# Patient Record
Sex: Male | Born: 1940 | Race: White | Hispanic: No | Marital: Married | Smoking: Never smoker
Health system: Southern US, Community
[De-identification: ages and names within clinical notes are randomized; demographics above are authoritative.]

## PROBLEM LIST (undated history)

## (undated) DIAGNOSIS — E785 Hyperlipidemia, unspecified: Secondary | ICD-10-CM

## (undated) DIAGNOSIS — U071 COVID-19: Secondary | ICD-10-CM

## (undated) DIAGNOSIS — J45909 Unspecified asthma, uncomplicated: Secondary | ICD-10-CM

## (undated) DIAGNOSIS — I10 Essential (primary) hypertension: Secondary | ICD-10-CM

## (undated) DIAGNOSIS — I4891 Unspecified atrial fibrillation: Secondary | ICD-10-CM

## (undated) DIAGNOSIS — I739 Peripheral vascular disease, unspecified: Secondary | ICD-10-CM

## (undated) DIAGNOSIS — E119 Type 2 diabetes mellitus without complications: Secondary | ICD-10-CM

## (undated) DIAGNOSIS — E663 Overweight: Secondary | ICD-10-CM

## (undated) DIAGNOSIS — N200 Calculus of kidney: Secondary | ICD-10-CM

## (undated) DIAGNOSIS — I2510 Atherosclerotic heart disease of native coronary artery without angina pectoris: Secondary | ICD-10-CM

## (undated) DIAGNOSIS — I639 Cerebral infarction, unspecified: Secondary | ICD-10-CM

## (undated) HISTORY — DX: COVID-19: U07.1

## (undated) HISTORY — DX: Unspecified asthma, uncomplicated: J45.909

## (undated) HISTORY — PX: HEMORRHOID BANDING: SHX5850

## (undated) HISTORY — DX: Cerebral infarction, unspecified: I63.9

## (undated) HISTORY — DX: Atherosclerotic heart disease of native coronary artery without angina pectoris: I25.10

## (undated) HISTORY — DX: Calculus of kidney: N20.0

## (undated) HISTORY — DX: Hyperlipidemia, unspecified: E78.5

## (undated) HISTORY — DX: Peripheral vascular disease, unspecified: I73.9

## (undated) HISTORY — DX: Overweight: E66.3

## (undated) HISTORY — DX: Essential (primary) hypertension: I10

## (undated) HISTORY — PX: NASAL SINUS SURGERY: SHX719

---

## 1997-08-21 HISTORY — PX: CAROTID ENDARTERECTOMY: SUR193

## 1998-03-21 HISTORY — PX: CORONARY ARTERY BYPASS GRAFT: SHX141

## 1998-04-17 ENCOUNTER — Encounter: Payer: Self-pay | Admitting: Emergency Medicine

## 1998-04-17 ENCOUNTER — Inpatient Hospital Stay (HOSPITAL_COMMUNITY): Admission: EM | Admit: 1998-04-17 | Discharge: 1998-04-27 | Payer: Self-pay | Admitting: Emergency Medicine

## 1998-04-19 ENCOUNTER — Encounter: Payer: Self-pay | Admitting: Cardiothoracic Surgery

## 1998-04-20 ENCOUNTER — Encounter: Payer: Self-pay | Admitting: Cardiothoracic Surgery

## 1998-04-21 ENCOUNTER — Encounter: Payer: Self-pay | Admitting: Cardiothoracic Surgery

## 1998-04-23 ENCOUNTER — Encounter: Payer: Self-pay | Admitting: Cardiothoracic Surgery

## 1998-04-27 ENCOUNTER — Encounter: Payer: Self-pay | Admitting: Cardiothoracic Surgery

## 1998-05-18 ENCOUNTER — Encounter (HOSPITAL_COMMUNITY): Admission: RE | Admit: 1998-05-18 | Discharge: 1998-08-16 | Payer: Self-pay | Admitting: Cardiology

## 1998-06-18 ENCOUNTER — Ambulatory Visit: Admission: RE | Admit: 1998-06-18 | Discharge: 1998-06-18 | Payer: Self-pay | Admitting: Cardiology

## 1998-12-27 ENCOUNTER — Ambulatory Visit (HOSPITAL_COMMUNITY): Admission: RE | Admit: 1998-12-27 | Discharge: 1998-12-27 | Payer: Self-pay | Admitting: Internal Medicine

## 2001-04-28 ENCOUNTER — Inpatient Hospital Stay (HOSPITAL_COMMUNITY): Admission: EM | Admit: 2001-04-28 | Discharge: 2001-04-30 | Payer: Self-pay

## 2001-04-30 ENCOUNTER — Encounter: Payer: Self-pay | Admitting: Cardiovascular Disease

## 2004-08-09 ENCOUNTER — Ambulatory Visit: Payer: Self-pay | Admitting: Internal Medicine

## 2004-09-07 ENCOUNTER — Ambulatory Visit: Payer: Self-pay | Admitting: Internal Medicine

## 2004-09-14 ENCOUNTER — Ambulatory Visit: Payer: Self-pay | Admitting: Internal Medicine

## 2005-02-17 ENCOUNTER — Ambulatory Visit: Payer: Self-pay | Admitting: Internal Medicine

## 2005-05-22 ENCOUNTER — Ambulatory Visit: Payer: Self-pay | Admitting: Internal Medicine

## 2005-05-24 ENCOUNTER — Ambulatory Visit: Payer: Self-pay | Admitting: Cardiology

## 2005-06-02 ENCOUNTER — Ambulatory Visit: Payer: Self-pay | Admitting: Cardiology

## 2005-06-06 ENCOUNTER — Ambulatory Visit: Payer: Self-pay | Admitting: Gastroenterology

## 2005-06-06 ENCOUNTER — Ambulatory Visit: Payer: Self-pay

## 2005-06-12 ENCOUNTER — Ambulatory Visit: Payer: Self-pay | Admitting: Cardiology

## 2005-06-12 ENCOUNTER — Observation Stay (HOSPITAL_COMMUNITY): Admission: EM | Admit: 2005-06-12 | Discharge: 2005-06-13 | Payer: Self-pay | Admitting: Emergency Medicine

## 2005-06-13 ENCOUNTER — Ambulatory Visit: Payer: Self-pay

## 2005-06-13 ENCOUNTER — Ambulatory Visit: Payer: Self-pay | Admitting: Cardiology

## 2005-06-29 ENCOUNTER — Ambulatory Visit: Payer: Self-pay | Admitting: Cardiology

## 2005-10-26 ENCOUNTER — Ambulatory Visit: Payer: Self-pay | Admitting: Cardiology

## 2005-12-20 ENCOUNTER — Encounter: Payer: Self-pay | Admitting: Internal Medicine

## 2005-12-20 ENCOUNTER — Ambulatory Visit: Payer: Self-pay | Admitting: Cardiology

## 2006-01-25 ENCOUNTER — Ambulatory Visit: Payer: Self-pay | Admitting: Internal Medicine

## 2006-03-01 ENCOUNTER — Ambulatory Visit: Payer: Self-pay | Admitting: Cardiology

## 2006-03-15 ENCOUNTER — Ambulatory Visit: Payer: Self-pay | Admitting: Internal Medicine

## 2006-05-07 ENCOUNTER — Ambulatory Visit: Payer: Self-pay | Admitting: Cardiology

## 2006-05-16 ENCOUNTER — Ambulatory Visit: Payer: Self-pay | Admitting: Cardiology

## 2006-06-15 ENCOUNTER — Ambulatory Visit: Payer: Self-pay

## 2006-06-15 ENCOUNTER — Ambulatory Visit: Payer: Self-pay | Admitting: Internal Medicine

## 2006-07-19 ENCOUNTER — Ambulatory Visit: Payer: Self-pay | Admitting: Cardiology

## 2006-07-19 LAB — CONVERTED CEMR LAB
ALT: 40 units/L (ref 0–40)
AST: 40 units/L — ABNORMAL HIGH (ref 0–37)
Albumin: 3.7 g/dL (ref 3.5–5.2)
Alkaline Phosphatase: 76 units/L (ref 39–117)
Bilirubin, Direct: 0.2 mg/dL (ref 0.0–0.3)
Chol/HDL Ratio, serum: 3.4
Cholesterol: 133 mg/dL (ref 0–200)
HDL: 39.4 mg/dL (ref >39.0)
LDL Cholesterol: 75 mg/dL (ref 0–99)
Total Bilirubin: 0.7 mg/dL (ref 0.3–1.2)
Total Protein: 6.7 g/dL (ref 6.0–8.3)
Triglyceride fasting, serum: 94 mg/dL (ref 0–149)
VLDL: 19 mg/dL (ref 0–40)

## 2006-10-10 ENCOUNTER — Ambulatory Visit: Payer: Self-pay | Admitting: Cardiology

## 2006-10-10 LAB — CONVERTED CEMR LAB
ALT: 38 units/L (ref 0–40)
AST: 37 units/L (ref 0–37)
Albumin: 3.5 g/dL (ref 3.5–5.2)
Alkaline Phosphatase: 74 units/L (ref 39–117)
Bilirubin, Direct: 0.2 mg/dL (ref 0.0–0.3)
Cholesterol: 130 mg/dL (ref 0–200)
HDL: 39.5 mg/dL (ref >39.0)
LDL Cholesterol: 72 mg/dL (ref 0–99)
Total Bilirubin: 0.8 mg/dL (ref 0.3–1.2)
Total CHOL/HDL Ratio: 3.3
Total Protein: 6.8 g/dL (ref 6.0–8.3)
Triglycerides: 92 mg/dL (ref 0–149)
VLDL: 18 mg/dL (ref 0–40)

## 2006-11-08 ENCOUNTER — Ambulatory Visit: Payer: Self-pay | Admitting: Internal Medicine

## 2007-05-16 ENCOUNTER — Ambulatory Visit: Payer: Self-pay | Admitting: Cardiology

## 2007-05-16 LAB — CONVERTED CEMR LAB
ALT: 29 units/L (ref 0–53)
AST: 35 units/L (ref 0–37)
Albumin: 3.6 g/dL (ref 3.5–5.2)
Alkaline Phosphatase: 75 units/L (ref 39–117)
Bilirubin, Direct: 0.2 mg/dL (ref 0.0–0.3)
Cholesterol: 119 mg/dL (ref 0–200)
HDL: 38.9 mg/dL — ABNORMAL LOW (ref >39.0)
LDL Cholesterol: 72 mg/dL (ref 0–99)
Total Bilirubin: 1.1 mg/dL (ref 0.3–1.2)
Total CHOL/HDL Ratio: 3.1
Total Protein: 6.7 g/dL (ref 6.0–8.3)
Triglycerides: 43 mg/dL (ref 0–149)
VLDL: 9 mg/dL (ref 0–40)

## 2007-05-21 ENCOUNTER — Ambulatory Visit: Payer: Self-pay | Admitting: Cardiology

## 2007-06-18 ENCOUNTER — Ambulatory Visit: Payer: Self-pay

## 2007-07-10 ENCOUNTER — Ambulatory Visit: Payer: Self-pay | Admitting: Internal Medicine

## 2007-07-10 DIAGNOSIS — J019 Acute sinusitis, unspecified: Secondary | ICD-10-CM | POA: Insufficient documentation

## 2007-08-22 HISTORY — PX: COLONOSCOPY W/ POLYPECTOMY: SHX1380

## 2007-10-28 ENCOUNTER — Ambulatory Visit: Payer: Self-pay | Admitting: Gastroenterology

## 2007-11-11 ENCOUNTER — Ambulatory Visit: Payer: Self-pay | Admitting: Gastroenterology

## 2007-11-11 ENCOUNTER — Encounter: Payer: Self-pay | Admitting: Internal Medicine

## 2007-12-17 ENCOUNTER — Ambulatory Visit: Payer: Self-pay | Admitting: Cardiology

## 2007-12-17 ENCOUNTER — Ambulatory Visit: Payer: Self-pay

## 2007-12-17 LAB — CONVERTED CEMR LAB
ALT: 33 units/L (ref 0–53)
AST: 36 units/L (ref 0–37)
Albumin: 3.7 g/dL (ref 3.5–5.2)
Alkaline Phosphatase: 74 units/L (ref 39–117)
Bilirubin, Direct: 0.1 mg/dL (ref 0.0–0.3)
Cholesterol: 127 mg/dL (ref 0–200)
HDL: 36.2 mg/dL — ABNORMAL LOW (ref >39.0)
LDL Cholesterol: 77 mg/dL (ref 0–99)
Total Bilirubin: 1 mg/dL (ref 0.3–1.2)
Total CHOL/HDL Ratio: 3.5
Total Protein: 7.2 g/dL (ref 6.0–8.3)
Triglycerides: 69 mg/dL (ref 0–149)
VLDL: 14 mg/dL (ref 0–40)

## 2008-05-26 ENCOUNTER — Ambulatory Visit: Payer: Self-pay | Admitting: Cardiology

## 2008-07-20 ENCOUNTER — Ambulatory Visit: Payer: Self-pay | Admitting: Internal Medicine

## 2008-07-20 DIAGNOSIS — Z87442 Personal history of urinary calculi: Secondary | ICD-10-CM | POA: Insufficient documentation

## 2008-07-20 DIAGNOSIS — I2510 Atherosclerotic heart disease of native coronary artery without angina pectoris: Secondary | ICD-10-CM | POA: Insufficient documentation

## 2008-07-20 DIAGNOSIS — R0609 Other forms of dyspnea: Secondary | ICD-10-CM | POA: Insufficient documentation

## 2008-07-20 DIAGNOSIS — I10 Essential (primary) hypertension: Secondary | ICD-10-CM | POA: Insufficient documentation

## 2008-07-20 DIAGNOSIS — R0989 Other specified symptoms and signs involving the circulatory and respiratory systems: Secondary | ICD-10-CM

## 2008-07-20 DIAGNOSIS — J309 Allergic rhinitis, unspecified: Secondary | ICD-10-CM | POA: Insufficient documentation

## 2008-07-20 DIAGNOSIS — E785 Hyperlipidemia, unspecified: Secondary | ICD-10-CM | POA: Insufficient documentation

## 2008-07-20 LAB — CONVERTED CEMR LAB
Bilirubin Urine: NEGATIVE
Blood in Urine, dipstick: NEGATIVE
Glucose, Urine, Semiquant: NEGATIVE
Ketones, urine, test strip: NEGATIVE
Nitrite: NEGATIVE
Specific Gravity, Urine: >=1.03
Urobilinogen, UA: NEGATIVE
WBC Urine, dipstick: NEGATIVE
pH: 5

## 2008-07-21 ENCOUNTER — Encounter (INDEPENDENT_AMBULATORY_CARE_PROVIDER_SITE_OTHER): Payer: Self-pay | Admitting: *Deleted

## 2008-07-21 LAB — CONVERTED CEMR LAB
BUN: 18 mg/dL (ref 6–23)
Basophils Absolute: 0 10*3/uL (ref 0.0–0.1)
Basophils Relative: 0.6 % (ref 0.0–3.0)
CO2: 24 meq/L (ref 19–32)
Calcium: 9.1 mg/dL (ref 8.4–10.5)
Chloride: 110 meq/L (ref 96–112)
Creatinine, Ser: 0.8 mg/dL (ref 0.4–1.5)
Eosinophils Absolute: 0.1 10*3/uL (ref 0.0–0.7)
Eosinophils Relative: 1.2 % (ref 0.0–5.0)
GFR calc Af Amer: 125 mL/min
GFR calc non Af Amer: 103 mL/min
Glucose, Bld: 84 mg/dL (ref 70–99)
HCT: 44.6 % (ref 39.0–52.0)
Hemoglobin: 15.3 g/dL (ref 13.0–17.0)
Lymphocytes Relative: 35.6 % (ref 12.0–46.0)
MCHC: 34.2 g/dL (ref 30.0–36.0)
MCV: 98.5 fL (ref 78.0–100.0)
Monocytes Absolute: 0.4 10*3/uL (ref 0.1–1.0)
Monocytes Relative: 8 % (ref 3.0–12.0)
Neutro Abs: 3 10*3/uL (ref 1.4–7.7)
Neutrophils Relative %: 54.6 % (ref 43.0–77.0)
PSA: 0.24 ng/mL (ref 0.10–4.00)
Platelets: 162 10*3/uL (ref 150–400)
Potassium: 3.8 meq/L (ref 3.5–5.1)
RBC: 4.53 M/uL (ref 4.22–5.81)
RDW: 12.1 % (ref 11.5–14.6)
Sodium: 142 meq/L (ref 135–145)
TSH: 2.38 microintl units/mL (ref 0.35–5.50)
WBC: 5.5 10*3/uL (ref 4.5–10.5)

## 2008-07-22 ENCOUNTER — Ambulatory Visit: Payer: Self-pay | Admitting: Internal Medicine

## 2008-07-23 ENCOUNTER — Encounter (INDEPENDENT_AMBULATORY_CARE_PROVIDER_SITE_OTHER): Payer: Self-pay | Admitting: *Deleted

## 2008-11-17 DIAGNOSIS — E663 Overweight: Secondary | ICD-10-CM | POA: Insufficient documentation

## 2008-12-16 ENCOUNTER — Ambulatory Visit: Payer: Self-pay

## 2008-12-16 ENCOUNTER — Encounter: Payer: Self-pay | Admitting: Cardiology

## 2009-04-08 ENCOUNTER — Encounter (INDEPENDENT_AMBULATORY_CARE_PROVIDER_SITE_OTHER): Payer: Self-pay | Admitting: *Deleted

## 2009-04-16 ENCOUNTER — Telehealth: Payer: Self-pay | Admitting: Cardiology

## 2009-05-27 ENCOUNTER — Ambulatory Visit: Payer: Self-pay | Admitting: Cardiology

## 2009-06-03 ENCOUNTER — Ambulatory Visit: Payer: Self-pay | Admitting: Cardiology

## 2009-06-03 LAB — CONVERTED CEMR LAB
ALT: 34 units/L (ref 0–53)
AST: 33 units/L (ref 0–37)
Albumin: 3.7 g/dL (ref 3.5–5.2)
Alkaline Phosphatase: 74 units/L (ref 39–117)
Bilirubin, Direct: 0.2 mg/dL (ref 0.0–0.3)
Cholesterol: 125 mg/dL (ref 0–200)
HDL: 36.7 mg/dL — ABNORMAL LOW (ref >39.00)
LDL Cholesterol: 71 mg/dL (ref 0–99)
Total Bilirubin: 0.7 mg/dL (ref 0.3–1.2)
Total CHOL/HDL Ratio: 3
Total Protein: 7 g/dL (ref 6.0–8.3)
Triglycerides: 86 mg/dL (ref 0.0–149.0)
VLDL: 17.2 mg/dL (ref 0.0–40.0)

## 2009-07-20 ENCOUNTER — Telehealth: Payer: Self-pay | Admitting: Cardiology

## 2009-09-29 ENCOUNTER — Telehealth: Payer: Self-pay | Admitting: Cardiology

## 2009-10-12 ENCOUNTER — Ambulatory Visit: Payer: Self-pay | Admitting: Internal Medicine

## 2009-10-22 ENCOUNTER — Telehealth: Payer: Self-pay | Admitting: Cardiology

## 2009-11-08 ENCOUNTER — Telehealth: Payer: Self-pay | Admitting: Internal Medicine

## 2009-11-09 ENCOUNTER — Ambulatory Visit: Payer: Self-pay | Admitting: Internal Medicine

## 2009-11-09 DIAGNOSIS — L255 Unspecified contact dermatitis due to plants, except food: Secondary | ICD-10-CM | POA: Insufficient documentation

## 2009-12-20 ENCOUNTER — Encounter: Payer: Self-pay | Admitting: Cardiology

## 2009-12-20 DIAGNOSIS — I779 Disorder of arteries and arterioles, unspecified: Secondary | ICD-10-CM | POA: Insufficient documentation

## 2009-12-20 DIAGNOSIS — I739 Peripheral vascular disease, unspecified: Secondary | ICD-10-CM

## 2009-12-21 ENCOUNTER — Telehealth: Payer: Self-pay | Admitting: Cardiology

## 2009-12-21 ENCOUNTER — Encounter: Payer: Self-pay | Admitting: Cardiology

## 2009-12-21 ENCOUNTER — Ambulatory Visit: Payer: Self-pay

## 2010-03-08 ENCOUNTER — Ambulatory Visit: Payer: Self-pay | Admitting: Internal Medicine

## 2010-03-08 DIAGNOSIS — M940 Chondrocostal junction syndrome [Tietze]: Secondary | ICD-10-CM | POA: Insufficient documentation

## 2010-03-08 DIAGNOSIS — R079 Chest pain, unspecified: Secondary | ICD-10-CM | POA: Insufficient documentation

## 2010-04-20 ENCOUNTER — Ambulatory Visit: Payer: Self-pay | Admitting: Cardiology

## 2010-04-29 ENCOUNTER — Encounter: Payer: Self-pay | Admitting: Cardiology

## 2010-04-29 LAB — CONVERTED CEMR LAB
ALT: 37 units/L (ref 0–53)
AST: 39 units/L — ABNORMAL HIGH (ref 0–37)
Albumin: 3.7 g/dL (ref 3.5–5.2)
Alkaline Phosphatase: 74 units/L (ref 39–117)
Bilirubin, Direct: 0.2 mg/dL (ref 0.0–0.3)
Cholesterol: 141 mg/dL (ref 0–200)
HDL: 38.3 mg/dL — ABNORMAL LOW (ref >39.00)
LDL Cholesterol: 84 mg/dL (ref 0–99)
Total Bilirubin: 0.9 mg/dL (ref 0.3–1.2)
Total CHOL/HDL Ratio: 4
Total Protein: 6.7 g/dL (ref 6.0–8.3)
Triglycerides: 92 mg/dL (ref 0.0–149.0)
VLDL: 18.4 mg/dL (ref 0.0–40.0)

## 2010-05-04 ENCOUNTER — Encounter: Payer: Self-pay | Admitting: Cardiology

## 2010-05-05 ENCOUNTER — Ambulatory Visit: Payer: Self-pay | Admitting: Internal Medicine

## 2010-05-05 ENCOUNTER — Ambulatory Visit: Payer: Self-pay | Admitting: Cardiology

## 2010-05-12 ENCOUNTER — Ambulatory Visit: Payer: Self-pay | Admitting: Internal Medicine

## 2010-06-07 ENCOUNTER — Ambulatory Visit: Payer: Self-pay

## 2010-06-07 ENCOUNTER — Ambulatory Visit: Payer: Self-pay | Admitting: Cardiology

## 2010-06-22 ENCOUNTER — Telehealth (INDEPENDENT_AMBULATORY_CARE_PROVIDER_SITE_OTHER): Payer: Self-pay

## 2010-06-23 ENCOUNTER — Ambulatory Visit: Payer: Self-pay

## 2010-06-23 ENCOUNTER — Ambulatory Visit: Payer: Self-pay | Admitting: Cardiology

## 2010-06-23 ENCOUNTER — Encounter (HOSPITAL_COMMUNITY)
Admission: RE | Admit: 2010-06-23 | Discharge: 2010-08-20 | Payer: Self-pay | Source: Home / Self Care | Attending: Cardiology | Admitting: Cardiology

## 2010-06-23 ENCOUNTER — Encounter: Payer: Self-pay | Admitting: *Deleted

## 2010-06-24 ENCOUNTER — Telehealth: Payer: Self-pay | Admitting: Cardiology

## 2010-07-06 ENCOUNTER — Telehealth: Payer: Self-pay | Admitting: Cardiology

## 2010-08-06 ENCOUNTER — Encounter: Payer: Self-pay | Admitting: Internal Medicine

## 2010-09-20 ENCOUNTER — Telehealth: Payer: Self-pay | Admitting: Cardiology

## 2010-09-20 NOTE — Assessment & Plan Note (Signed)
Summary: COUGH/KDC   Vital Signs:  Patient profile:   70 year old male Weight:      234.4 pounds Temp:     98.3 degrees F oral Pulse rate:   76 / minute Resp:     15 per minute BP sitting:   124 / 72  (left arm) Cuff size:   large  Vitals Entered By: Georgette Dover (October 12, 2009 2:41 PM) CC: Cough x 1 week or longer (worse @ night) Comments REVIEWED MED LIST, PATIENT AGREED DOSE AND INSTRUCTION CORRECT    Primary Care Provider:  [name] MD  CC:  Cough x 1 week or longer (worse @ night).  History of Present Illness: Onset 7-10 days ago  as malaise ; NP cough since 02/14. Purulence with traces of blood  from head as of 02/20. Rx: Neti  Rinse, ASA. Flu shot Fall 2010.  Allergies: 1)  ! Norvasc 2)  ! * Ivp Dye  Review of Systems General:  Denies chills, fever, and sweats. ENT:  Complains of nasal congestion and sinus pressure; denies ear discharge and earache; No frontal haeadche, facial pain. Resp:  Complains of wheezing; denies chest pain with inspiration, shortness of breath, and sputum productive. Allergy:  Complains of sneezing; denies itching eyes.  Physical Exam  General:  well-nourished,in no acute distress; alert,appropriate and cooperative throughout examination Ears:  Wax on R ; L TM WNL Nose:  External nasal examination shows no deformity or inflammation. Nasal mucosa are pink and moist without lesions or exudates. Mouth:  Oral mucosa and oropharynx without lesions or exudates.  Teeth in good repair. Lungs:  Normal respiratory effort, chest expands symmetrically. Lungs are clear to auscultation, no crackles or wheezes. Cervical Nodes:  No lymphadenopathy noted Axillary Nodes:  No palpable lymphadenopathy   Impression & Recommendations:  Problem # 1:  SINUSITIS- ACUTE-NOS (ICD-461.9)  His updated medication list for this problem includes:    Cefuroxime Axetil 500 Mg Tabs (Cefuroxime axetil) .Marland Kitchen... 1 two times a day    Fluticasone Propionate 50  Mcg/act Susp (Fluticasone propionate) .Marland Kitchen... 1 two times a day to each nostril  Complete Medication List: 1)  Metoprolol Tartrate 25 Mg Tabs (Metoprolol tartrate) .... Two times a day 2)  Simvastatin 40 Mg Tabs (Simvastatin) .Marland Kitchen.. 1 by mouth qd 3)  Niaspan 1000 Mg Tbcr (Niacin (antihyperlipidemic)) .Marland Kitchen.. 1 by mouth qd 4)  Ecasa 4m  .... Daily 5)  Multivitamin  ..Marland Kitchen. 1 by mouth qd 6)  B Complex  .... Daily 7)  Fish Oil 1 Gram  .... Two tabs daily 8)  Fiber 6263m .... Takes 4 tabs daily 9)  Vit C 50088m.... 2 tabs daily 10)  Claritin 3m6m... Marland Kitchen daily prn 11)  Prilosec 20 Mg Cpdr (Omeprazole) .... Marland Kitchen by mouth once daily 12)  Ventolin Hfa 108 (90 Base) Mcg/act Aers (Albuterol sulfate) .... Marland Kitchen-2 puffs 15-30 min pre exercise 13)  Cefuroxime Axetil 500 Mg Tabs (Cefuroxime axetil) .... Marland Kitchen two times a day 14)  Fluticasone Propionate 50 Mcg/act Susp (Fluticasone propionate) .... Marland Kitchen two times a day to each nostril  Patient Instructions: 1)  Neti Rinse two times a day followed by Fluticasone. 2)  Drink as much fluid as you can tolerate for the next few days. Prescriptions: FLUTICASONE PROPIONATE 50 MCG/ACT SUSP (FLUTICASONE PROPIONATE) 1 two times a day to each nostril  #1 x 5   Entered and Authorized by:   WillUnice Cobble  Signed by:  [name] MD on 10/12/2009   Method used:   Faxed to ...       [facility]       [address]       [place], [place]  [address]       Ph: [phone]       Fax: [phone]   RxID:   [id number] CEFUROXIME AXETIL 500 MG TABS (CEFUROXIME AXETIL) 1 two times a day  #20 x 0   Entered and Authorized by:   [name] MD   Signed by:   [name] MD on 10/12/2009   Method used:   Faxed to ...       [facility]       [address]       [place], [place]  [address]       Ph: [phone]       Fax: [phone]   RxID:   [id number]

## 2010-09-20 NOTE — Progress Notes (Signed)
Summary: dose pt need carotid/   DUE 11/2009  Phone Note Call from Patient Call back at Home Phone [phone]   Caller: Spouse Reason for Call: Talk to Nurse, Talk to Doctor Summary of Call: calling to see if patient needs to get a carotid study thuis year no rem in system Initial call taken by: Omer Jack,  September 29, 2009 11:07 AM  Follow-up for Phone Call        PT NOT DUE FOR REPEAT CAROTID DOPPLER UNTIL 11/2009.  PV RECALLS ARE KEPT BY [facility] AND NOT IN [facility].  WIFE AWARE AND WILL WAIT FOR [facility] TO CALL WITH APPOINTMENT Follow-up by: Charolotte Capuchin, RN,  September 29, 2009 11:19 AM

## 2010-09-20 NOTE — Progress Notes (Signed)
Summary: stress test results  Phone Note Call from Patient Call back at [phone]   Caller: Spouse/judy Reason for Call: Talk to Nurse, Lab or Test Results Summary of Call: pt wife judy is calling re stress test results  Initial call taken by: Roe Coombs,  June 24, 2010 10:54 AM  Follow-up for Phone Call        pts wife given tset results Meredith Staggers, RN  June 24, 2010 11:15 AM

## 2010-09-20 NOTE — Letter (Signed)
Summary: Handout Printed  Printed Handout:  - Lipid Profile 

## 2010-09-20 NOTE — Assessment & Plan Note (Signed)
Summary: flu shot.cbs  Nurse Visit   Allergies: 1)  ! Norvasc 2)  ! * Ivp Dye  Orders Added: 1)  Flu Vaccine 20yrs + MEDICARE PATIENTS [Q2039] 2)  Administration Flu vaccine - MCR [G0008] Flu Vaccine Consent Questions     Do you have a history of severe allergic reactions to this vaccine? no    Any prior history of allergic reactions to egg and/or gelatin? no    Do you have a sensitivity to the preservative Thimersol? no    Do you have a past history of Guillan-Barre Syndrome? no    Do you currently have an acute febrile illness? no    Have you ever had a severe reaction to latex? no    Vaccine information given and explained to patient? yes    Are you currently pregnant? no    Lot Number:[id number]   Exp Date:02/18/2011   Site Given  Right Deltoid IM .lbmedflu

## 2010-09-20 NOTE — Progress Notes (Signed)
Summary: QUESTION ABOUT A CAROTID  Phone Note Call from Patient Call back at Home Phone [phone]   Caller: Spouse/JUDY Summary of Call: PT WIFE HAVE QUESTION ABOUT THE PT GETTING A  TEST (CAROTID) Initial call taken by: Judie Grieve,  October 22, 2009 9:45 AM  Follow-up for Phone Call        Spoke with pt's wife regarding carotid duplex. She states pt's insurance will end coverage the end of March. She would like to know if the carotid duplex is done this month would the present insurance will cover the cost. I let pt's wife know I will find out will call her back. According to  [employer] will not cover because test needs to be done the same month and about same date every year. Pt's wife aware. Follow-up by: Ollen Gross, RN, BSN,  October 22, 2009 10:41 AM

## 2010-09-20 NOTE — Assessment & Plan Note (Signed)
Summary: side pain/cbs   Vital Signs:  Patient profile:   70 year old male Weight:      226.2 pounds BMI:     34.52 O2 Sat:      97 % Temp:     98.4 degrees F oral Pulse rate:   69 / minute Resp:     16 per minute BP sitting:   136 / 84  (left arm) Cuff size:   large  Vitals Entered By: Shonna Chock CMA (March 08, 2010 4:29 PM)  Primary Care Provider:  Marga Melnick MD   History of Present Illness:       This is a 70 year old male who presents with Chest pain  in past  2 weeks intermittently , initially  after push mowing. It resolved but recurred after lifting heavy box , weighing 75-100 # The patient reports shortness of breath .He has chronic  indigestion, but denies resting chest pain, exertional chest pain, nausea, vomiting, diaphoresis, palpitations, dizziness, light headedness, and syncope.  The pain is described as intermittent and sharp.  The pain is located in the left anterior chest and the pain does not radiate.  Episodes of chest pain last  few seconds.  The pain is brought on or made worse by coughing and deep breathing.  The pain is relieved or improved with rest.    Allergies: 1)  ! Norvasc 2)  ! * Ivp Dye  Review of Systems CV:  Denies difficulty breathing at night, difficulty breathing while lying down, leg cramps with exertion, swelling of feet, and swelling of hands. Resp:  Denies cough, coughing up blood, and wheezing.  Physical Exam  General:  well-nourished,in no acute distress; alert,appropriate and cooperative throughout examination Eyes:  No corneal or conjunctival inflammation noted. No icterus Chest Wall:  Classic costochondral pain to deep palpation L inferior margin Lungs:  Normal respiratory effort, chest expands symmetrically. Lungs are clear to auscultation, no crackles or wheezes. Heart:  normal rate, regular rhythm, no gallop, no rub, no JVD, no HJR, and grade 1 /6 systolic murmur.   Abdomen:  Bowel sounds positive,abdomen soft and non-tender  without masses, organomegaly or hernias noted. Pulses:  R and L carotid,radial,dorsalis pedis and posterior tibial pulses are full and equal bilaterally Extremities:  No clubbing, cyanosis, edema. Negative Homan's  Neurologic:  alert & oriented X3.   Skin:  Intact without suspicious lesions or rashes Cervical Nodes:  No lymphadenopathy noted Axillary Nodes:  No palpable lymphadenopathy Psych:  memory intact for recent and remote, normally interactive, and good eye contact.     Impression & Recommendations:  Problem # 1:  CHEST PAIN (ICD-786.50)  CAD , PMH of ,but clinically due to #2  Orders: EKG w/ Interpretation (93000)  Problem # 2:  COSTOCHONDRITIS (ICD-733.6)  Complete Medication List: 1)  Metoprolol Tartrate 25 Mg Tabs (Metoprolol tartrate) .... Two times a day 2)  Simvastatin 40 Mg Tabs (Simvastatin) .Marland Kitchen.. 1 by mouth qd 3)  Niaspan 1000 Mg Tbcr (Niacin (antihyperlipidemic)) .Marland Kitchen.. 1 by mouth qd 4)  Ecasa 81mg   .... Daily 5)  Multivitamin  .Marland Kitchen.. 1 by mouth qd 6)  B Complex  .... Daily 7)  Fish Oil 1 Gram  .... Two tabs daily 8)  Fiber 625mg   .... Takes 4 tabs daily 9)  Vit C 500mg   .... 2 tabs daily 10)  Claritin 10mg   .... 1 daily prn 11)  Prilosec 20 Mg Cpdr (Omeprazole) .Marland Kitchen.. 1 by mouth once daily 12)  Tramadol  Hcl 50 Mg Tabs (Tramadol hcl) .Marland Kitchen.. 1 every 6 hrs as needed pain  Patient Instructions: 1)  Moist heat OR Zostrix cream to L chest three times a day as needed  for Costochondritis. Prescriptions: TRAMADOL HCL 50 MG TABS (TRAMADOL HCL) 1 every 6 hrs as needed pain  #30 x 1   Entered and Authorized by:   Marga Melnick MD   Signed by:   Marga Melnick MD on 03/08/2010   Method used:   Print then Give to Patient   RxID:   [id number]

## 2010-09-20 NOTE — Miscellaneous (Signed)
Summary: rx for Metoprolol tartrate and Lexiscan  Clinical Lists Changes  Medications: Changed medication from METOPROLOL TARTRATE 25 MG TABS (METOPROLOL TARTRATE) two times a day to METOPROLOL TARTRATE 50 MG TABS (METOPROLOL TARTRATE) one twice a day - Signed Rx of METOPROLOL TARTRATE 50 MG TABS (METOPROLOL TARTRATE) one twice a day;  #60 x 11;  Signed;  Entered by: Charolotte Capuchin, RN;  Authorized by: Rollene Rotunda, MD, [facility];  Method used: Electronically to [facility] [address], [place], [place], [place]  [address], Ph: [phone], Fax: [phone] Orders: Added new Referral order of Nuclear Stress Test (Nuc Stress Test) - Signed    Prescriptions: METOPROLOL TARTRATE 50 MG TABS (METOPROLOL TARTRATE) one twice a day  #60 x 11   Entered by:   Charolotte Capuchin, RN   Authorized by:   Rollene Rotunda, MD, [facility]   Signed by:   Charolotte Capuchin, RN on 06/07/2010   Method used:   Electronically to        [facility] [address] (retail)       [address], [address]       [place], [place]  [address]       Ph: [phone]       Fax: [phone]   RxID:   [id number]

## 2010-09-20 NOTE — Assessment & Plan Note (Signed)
Summary: EMP/YEARLY/PH   Vital Signs:  Patient Profile:   70 Years Old Male Height:     68.5 inches Weight:      220.8 pounds Temp:     98.0 degrees F oral Pulse rate:   64 / minute Resp:     16 per minute BP sitting:   118 / 84  (left arm) Cuff size:   large  Vitals Entered By: Shonna Chock (July 20, 2008 11:32 AM)                 Chief Complaint:  CPX WITH FASTING LABS , SICK X 1-2 WEEKS: FEVER, HEAD CONGESTION, RUNNY NOSE, and COUGH.  History of Present Illness: All symptoms are in  head ; Rx: Mucinex DM, Coricidin HP & Delsym. Onset 2 weeks ago as malaise. Dr Hochrein's 10/09 & labs in 4/09 reviewed.    Current Allergies (reviewed today): ! NORVASC ! * IVP DYE  Past Medical History:    Elevated Alk Phosphatase    Hyperlipidemia, Dr Antoine Poche    Hypertension    Allergic rhinitis    Nephrolithiasis, hx of  Past Surgical History:    Colonoscopy 2003;     Colon polypectomy 2009, Dr Jarold Motto    Coronary artery bypass graft 1999    Carotid endarterectomy 1999, complicated by TIA   Family History:    Father: DM    Mother: CAD, asthma    Siblings: 1/2 bro prostate CA, hemochromatosis  Social History:    Occupation: Transport planner    Married    Never Smoked    Alcohol use-yes    Regular exercise-no   Risk Factors:  Tobacco use:  never Alcohol use:  yes    Type:  Rare Exercise:  no   Review of Systems  General      Denies chills, fever, and sweats.      Fever last week  Eyes      Denies discharge, eye pain, and red eye.  ENT      Complains of nasal congestion and sinus pressure.      Denies earache.      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 Recent frontal headache & facial pain with yellow D/C  CV      Denies leg cramps with exertion.      Minor exertional cp, ? due to RAD(discussed with Dr Antoine Poche)  Resp      Denies coughing up blood.      Scant sputum. No apnea  as per his wife. 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  GU      Denies dysuria, hematuria, and nocturia.  MS      Complains of joint pain and low back pain.      Some nocturnal R knee pain & LS pain occa   Derm      Denies changes in nail beds, dryness, hair loss, and lesion(s).  Neuro      Denies disturbances in coordination, numbness, poor balance, and tingling.  Psych      Denies anxiety and depression.  Endo      Denies cold intolerance, excessive hunger, excessive thirst, excessive urination, heat intolerance, polyuria, and weight change.  Heme      Denies abnormal bruising and bleeding.  Allergy      Complains of itching eyes, seasonal allergies, and sneezing.   Physical Exam  General:     well-nourished,in no acute  distress; alert,appropriate and cooperative throughout examination Head:     Normocephalic and atraumatic without obvious abnormalities. No apparent alopecia  Eyes:     No corneal or conjunctival inflammation noted. Perrla. Funduscopic exam benign, without hemorrhages, exudates or papilledema. Ears:     External ear exam shows no significant lesions or deformities.  Otoscopic examination reveals  some increased wax bilaterally Nose:     External nasal examination shows no deformity or inflammation. Nasal mucosa are pink and moist without lesions or exudates. Slight hyponasal speech Mouth:     Oral mucosa and oropharynx without lesions or exudates.  Teeth in good repair. Neck:     No deformities, masses, or tenderness noted. Lungs:     Normal respiratory effort, chest expands symmetrically. Lungs are clear to auscultation, no crackles or wheezes. Heart:     Normal rate and regular rhythm. S1 and S2 normal without gallop, murmur, click, rub. S4 with slurring Abdomen:     Bowel sounds positive,abdomen soft and non-tender without masses, organomegaly.Ventral hernia noted with sitting up Rectal:     Colonoscopy 4/09 Genitalia:      Testes bilaterally descended without nodularity, tenderness or masses. No scrotal masses or lesions. No penis lesions or urethral discharge. Prostate:     PSA drawn Msk:     No deformity or scoliosis noted of thoracic or lumbar spine.   Pulses:     R and L carotid,radial,dorsalis pedis and posterior tibial pulses are full and equal bilaterally Extremities:     No clubbing, cyanosis, edema. DJD of hands; crepitus of knees Neurologic:     alert & oriented X3 and DTRs symmetrical and normal.   Skin:     Intact without suspicious lesions or rashes Cervical Nodes:     No lymphadenopathy noted Axillary Nodes:     No palpable lymphadenopathy Psych:     memory intact for recent and remote, normally interactive, and good eye contact.      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 1/2 tab bid   Problem # 3:  HYPERTENSION (ICD-401.9)  His updated medication list for this problem includes:    Metoprolol Tartrate 50 Mg Tabs (Metoprolol tartrate) .Marland Kitchen... 1/2 tab bid  Orders: Venipuncture (13086)   Problem # 4:  OTHER DYSPNEA AND RESPIRATORY ABNORMALITIES (ICD-786.09) R/O RAD His updated medication list for this problem includes:    Metoprolol Tartrate 50 Mg Tabs (Metoprolol tartrate) .Marland Kitchen... 1/2 tab bid    Ventolin Hfa 108 (90 Base) Mcg/act Aers (Albuterol sulfate) .Marland Kitchen... 1-2 puffs 15-30 min pre exercise  Orders: T-2 View CXR (71020TC)   Problem # 5:  SINUSITIS-  ACUTE-NOS (ICD-461.9)  The following medications were removed from the medication list:    Promethazine-codeine 6.25-10 Mg/70ml Syrp (Promethazine-codeine) .Marland Kitchen... 1-2 tsp q 6 hrs prn    Amoxicillin-pot Clavulanate 875-125 Mg Tabs (Amoxicillin-pot clavulanate) .Marland Kitchen... 1 q12 hrs with a meal  His updated medication list for this problem includes:    Amoxicillin-pot Clavulanate 500-125 Mg Tabs (Amoxicillin-pot clavulanate) .Marland Kitchen... 1 q 12 hrs with a meal   Problem #  6:  HYPERLIPIDEMIA (ICD-272.4) as per Dr Antoine Poche; annual monitor in April His updated medication list for this problem includes:    Simvastatin 40 Mg Tabs (Simvastatin) .Marland Kitchen... 1 by mouth qd    Niaspan 1000 Mg Tbcr (Niacin (antihyperlipidemic)) .Marland Kitchen... 1 by mouth qd  Orders: Venipuncture ([address])   Complete Medication List: 1)  Metoprolol Tartrate 50 Mg Tabs (Metoprolol tartrate) .... 1/2 tab bid 2)  Simvastatin 40 Mg Tabs (Simvastatin) .Marland Kitchen.. 1 by mouth qd 3)  Niaspan 1000 Mg Tbcr (Niacin (antihyperlipidemic)) .Marland Kitchen.. 1 by mouth qd 4)  Ecasa 81mg   .... Daily 5)  Multivitamin  .Marland Kitchen.. 1 by mouth qd 6)  B Complex  .... Daily 7)  Fish Oil 1 Gram  .... Two tabs daily 8)  Fiber 625mg   .... Takes 4 tabs daily 9)  Vit C 500mg   .... 2 tabs daily 10)  Claritin 10mg   .... 1 daily prn 11)  Prilosec 20 Mg Cpdr (Omeprazole) .Marland Kitchen.. 1 by mouth once daily 12)  Ventolin Hfa 108 (90 Base) Mcg/act Aers (Albuterol sulfate) .Marland Kitchen.. 1-2 puffs 15-30 min pre exercise 13)  Amoxicillin-pot Clavulanate 500-125 Mg Tabs (Amoxicillin-pot clavulanate) .Marland Kitchen.. 1 q 12 hrs with a meal   Patient Instructions: 1)  CXray @ [facility] office. Neti pot once daily as needed for head congestion   Prescriptions: AMOXICILLIN-POT CLAVULANATE 500-125 MG TABS (AMOXICILLIN-POT CLAVULANATE) 1 q 12 hrs with a meal  #20 x 0   Entered and Authorized by:   Marga Melnick MD   Signed by:   Marga Melnick MD on 07/20/2008   Method used:   Print then Give to Patient   RxID:    [id number] VENTOLIN HFA 108 (90 BASE) MCG/ACT AERS (ALBUTEROL SULFATE) 1-2 puffs 15-30 min pre exercise  #1 x 5   Entered and Authorized by:   Marga Melnick MD   Signed by:   Marga Melnick MD on 07/20/2008   Method used:   Print then Give to Patient   RxID:   [id number]  ]  Pneumovax Immunization History:    Pneumovax # 1:  Historical (05/22/2005)  Laboratory Results   Urine Tests   Date/Time Reported: July 20, 2008 11:59 AM   Routine Urinalysis   Color: orange Appearance: Clear Glucose: negative   (Normal Range: Negative) Bilirubin: negative   (Normal Range: Negative) Ketone: negative   (Normal Range: Negative) Spec. Gravity: >=1.030   (Normal Range: 1.003-1.035) Blood: negative   (Normal Range: Negative) pH: 5.0   (Normal Range: 5.0-8.0) Protein: trace   (Normal Range: Negative) Urobilinogen: negative   (Normal Range: 0-1) Nitrite: negative   (Normal Range: Negative) Leukocyte Esterace: negative   (Normal Range: Negative)    CommentsFloydene Flock CMA  July 20, 2008 11:59 AM

## 2010-09-20 NOTE — Progress Notes (Signed)
Summary: Poison Ivy  Phone Note Call from Patient Call back at Home Phone [phone] Call back at cell# [phone] Kristen Cardinal: Patient wife Darel Hong Call For: Marga Melnick MD Summary of Call: Patient has poison ivy and is allergic to it.  Patient wants to know what can he do over the counter or Can you call something in.  Call cell number first and if no answer call home number. Initial call taken by: Harold Barban,  November 08, 2009 12:31 PM  Follow-up for Phone Call        see Rx Follow-up by: Marga Melnick MD,  November 08, 2009 1:54 PM  Additional Follow-up for Phone Call Additional follow up Details #1::        pt aware..................[facility] KitchenFelecia Deloach CMA  November 08, 2009 2:48 PM     New/Updated Medications: MOMETASONE FUROATE 0.1 % CREA (MOMETASONE FUROATE) apply two times a day to rash, not to face Prescriptions: MOMETASONE FUROATE 0.1 % CREA (MOMETASONE FUROATE) apply two times a day to rash, not to face  #60 grams x 0   Entered by:   Jeremy Johann CMA   Authorized by:   Marga Melnick MD   Signed by:   Jeremy Johann CMA on 11/08/2009   Method used:   Faxed to ...       [facility]       [address]       [place], [place]  [address]       Ph: [phone]       Fax: [phone]   RxID:   [id number]

## 2010-09-20 NOTE — Miscellaneous (Signed)
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 

## 2010-09-20 NOTE — Letter (Signed)
Summary: Custom - Lipid  [facility], Main Office  [address]   [place], [place] [address]   Phone: [phone]  Fax: [phone]         April 29, 2010 MRN: [id number]     [facility] [address] [place], [place]  [address]   Dear Mr. Bifulco,  We have reviewed your cholesterol results.  They are as follows:     Total Cholesterol:    141 (Desirable: less than 200)       HDL  Cholesterol:     38.30  (Desirable: greater than 40 for men and 50 for women)       LDL Cholesterol:       84  (Desirable: less than 100 for low risk and less than 70 for moderate to high risk)       Triglycerides:       92.0  (Desirable: less than 150)  Our recommendations include:  Please review your diet and decrease fats and trans fats as much as possible   Call our office at the number listed above if you have any questions.  Lowering your LDL cholesterol is important, but it is only one of a large number of "risk factors" that may indicate that you are at risk for heart disease, stroke or other complications of hardening of the arteries.  Other risk factors include:   A.  Cigarette Smoking* B.  High Blood Pressure* C.  Obesity* D.   Low HDL Cholesterol (see yours above)* E.   Diabetes Mellitus (higher risk if your is uncontrolled) F.  Family history of premature heart disease G.  Previous history of stroke or cardiovascular disease        *These are risk factors YOU HAVE CONTROL OVER.  For more information, visit .  There is now evidence that lowering the TOTAL CHOLESTEROL AND LDL CHOLESTEROL can reduce the risk of heart disease.  The American Heart Association recommends the following guidelines for the treatment of elevated cholesterol:  1.  If there is now current heart disease and less than two risk factors, TOTAL CHOLESTEROL should be less than 200 and LDL CHOLESTEROL should be less than 100. 2.  If there is current heart disease or two or more risk  factors, TOTAL CHOLESTEROL should be less than 200 and LDL CHOLESTEROL should be less than 70.  A diet low in cholesterol, saturated fat, and calories is the cornerstone of treatment for elevated cholesterol.  Cessation of smoking and exercise are also important in the management of elevated cholesterol and preventing vascular disease.  Studies have shown that 30 to 60 minutes of physical activity most days can help lower blood pressure, lower cholesterol, and keep your weight at a healthy level.  Drug therapy is used when cholesterol levels do not respond to therapeutic lifestyle changes (smoking cessation, diet, and exercise) and remains unacceptably high.  If medication is started, it is important to have you levels checked periodically to evaluate the need for further treatment options.      Thank you,     Sander Nephew, RN for Dr. Rollene Rotunda [facility] Team

## 2010-09-20 NOTE — Assessment & Plan Note (Signed)
Summary: rash no better//fd   Vital Signs:  Patient profile:   70 year old male Weight:      229 pounds Temp:     98.5 degrees F oral Pulse rate:   74 / minute Resp:     15 per minute BP sitting:   118 / 72  (left arm) Cuff size:   large  Vitals Entered By: Georgette Dover (November 09, 2009 9:28 AM) CC: Rash on arms Comments REVIEWED MED LIST, PATIENT AGREED DOSE AND INSTRUCTION CORRECT    Primary Care Provider:  [name] MD  CC:  Rash on arms.  History of Present Illness: Onset 11/06/2009 after yard cleaning; he wore gloves & long sleeves. Progressive since despite OTC corti.sone  Allergies: 1)  ! Norvasc 2)  ! * Ivp Dye  Review of Systems General:  Denies chills, fever, and weight loss. Derm:  Complains of changes in color of skin, itching, and rash.  Physical Exam  General:  in no acute distress; alert,appropriate and cooperative throughout examination Skin:  Classic contact dermatitis R forearm Cervical Nodes:  No lymphadenopathy noted Axillary Nodes:  No palpable lymphadenopathy   Impression & Recommendations:  Problem # 1:  POISON IVY DERMATITIS (ICD-692.6)  His updated medication list for this problem includes:    Mometasone Furoate 0.1 % Crea (Mometasone furoate) .[facility]... Apply two times a day to rash, not to face    Prednisone 20 Mg Tabs (Prednisone) .Marland Kitchen... 1 two times a day with food  Complete Medication List: 1)  Metoprolol Tartrate 25 Mg Tabs (Metoprolol tartrate) .... Two times a day 2)  Simvastatin 40 Mg Tabs (Simvastatin) .Marland Kitchen.. 1 by mouth qd 3)  Niaspan 1000 Mg Tbcr (Niacin (antihyperlipidemic)) .Marland Kitchen.. 1 by mouth qd 4)  Ecasa 19m  .... Daily 5)  Multivitamin  ..Marland Kitchen. 1 by mouth qd 6)  B Complex  .... Daily 7)  Fish Oil 1 Gram  .... Two tabs daily 8)  Fiber 6238m .... Takes 4 tabs daily 9)  Vit C 50065m.... 2 tabs daily 10)  Claritin 28m89m... Marland Kitchen daily prn 11)  Prilosec 20 Mg Cpdr (Omeprazole) .... Marland Kitchen by mouth once daily 12)  Ventolin Hfa 108 (90  Base) Mcg/act Aers (Albuterol sulfate) .... Marland Kitchen-2 puffs 15-30 min pre exercise 13)  Cefuroxime Axetil 500 Mg Tabs (Cefuroxime axetil) .... Marland Kitchen two times a day 14)  Fluticasone Propionate 50 Mcg/act Susp (Fluticasone propionate) .... Marland Kitchen two times a day to each nostril 15)  Mometasone Furoate 0.1 % Crea (Mometasone furoate) .... Apply two times a day to rash, not to face 16)  Prednisone 20 Mg Tabs (Prednisone) .... Marland Kitchen two times a day with food 17)  Hydroxyzine Pamoate 25 Mg Caps (Hydroxyzine pamoate) .... Marland Kitchen q 6 hrs as needed itching  Patient Instructions: 1)  Report Warning Signs as discussed. Prescriptions: HYDROXYZINE PAMOATE 25 MG CAPS (HYDROXYZINE PAMOATE) 1 q 6 hrs as needed itching  #30 x 1   Entered and Authorized by:   WillUnice Cobble  Signed by:   WillUnice Cobbleon 11/09/2009   Method used:   Faxed to ...       [facility] ([facility]       [address]   [place]  [id number]   Ph: [phone]   Fax: [phone]:   [id number]  PREDNISONE 20 MG TABS (PREDNISONE) 1 two times a day with food  #14 x 0   Entered and Authorized by:   [name] MD   Signed by:   [name] MD on 11/09/2009   Method used:   Faxed to ...       [facility] ([facility]ail)       [address]       [place], [place]  [address]       Ph: [phone]       Fax: [phone]   RxID:   [id number] MOMETASONE FUROATE 0.1 % CREA (MOMETASONE FUROATE) apply two times a day to rash, not to face  #60 grams x 0   Entered and Authorized by:   [name] MD   Signed by:   [name] MD on 11/09/2009   Method used:   Faxed to ...       [facility] ([facility])       [address]       [place], [place]  [address]       Ph: [phone]       Fax: [phone]   RxID:   [id number]

## 2010-09-20 NOTE — Assessment & Plan Note (Signed)
Summary: congestion,yellow mucus//tl  Medications Added METOPROLOL TARTRATE 50 MG  TABS (METOPROLOL TARTRATE) 1/2 tab bid SIMVASTATIN 40 MG  TABS (SIMVASTATIN) 1 by mouth qd NIASPAN 1000 MG  TBCR (NIACIN (ANTIHYPERLIPIDEMIC)) 1 by mouth qd * ECASA 81MG  daily * MULTIVITAMIN 1 by mouth qd * B COMPLEX daily * FISH OIL 1 GRAM two tabs daily * FIBER 625MG  takes 4 tabs daily * VIT C 500MG  2 tabs daily * CLARITIN 10MG  1 daily prn PROMETHAZINE-CODEINE 6.25-10 MG/5ML  SYRP (PROMETHAZINE-CODEINE) 1-2 tsp q 6 hrs prn AMOXICILLIN-POT CLAVULANATE 875-125 MG  TABS (AMOXICILLIN-POT CLAVULANATE) 1 q12 hrs with a meal      Allergies Added: ! NORVASC ! * IVP DYE  Vital Signs:  Patient Profile:   70 Years Old Male Weight:      216.13 pounds Temp:     98.6 degrees F oral Pulse rate:   64 / minute Pulse rhythm:   regular Resp:     16 per minute BP sitting:   140 / 80  (left arm) Cuff size:   large  Pt. in pain?   no  Vitals Entered By: Wendall Stade (July 10, 2007 12:02 PM)                  Chief Complaint:  sick since friday and URI symptoms.  History of Present Illness: started friday with cough and then was some better and now worse again, hacking cough with drainage all colors, fever last friday of 102  URI Symptoms Rx: none      This is a 70 year old man who presents with URI symptoms.  The patient reports nasal congestion, purulent nasal discharge, productive cough, and sick contacts, but denies clear nasal discharge, sore throat, dry cough, and earache.  Associated symptoms include stiff neck and wheezing.  The patient denies fever, fever of 100.5-103 degrees, dyspnea, rash, vomiting, diarrhea, use of an antipyretic, and response to antipyretic.  The patient also reports itchy watery eyes, itchy throat, sneezing, seasonal symptoms, response to antihistamine, and headache.  The patient denies muscle aches and severe fatigue.  Risk factors for Strep sinusitis include double  sickening.  The patient denies the following risk factors for Strep sinusitis: unilateral facial pain, unilateral nasal discharge, poor response to decongestant, tooth pain, Strep exposure, and tender adenopathy.    Current Allergies: ! NORVASC ! * IVP DYE      Physical Exam  General:     Well-developed,well-nourished,in no acute distress; alert,appropriate and cooperative throughout examination Eyes:     No corneal or conjunctival inflammation noted. EOMI. Perrla. Vision grossly normal. Ears:     TMs scarred Nose:     External nasal examination shows no deformity or inflammation. Nasal mucosa are pink and moist without lesions or exudates. Mouth:     Uvula edematous Lungs:     Normal respiratory effort, chest expands symmetrically. Lungs are clear to auscultation, no crackles or wheezes. Dry cough Cervical Nodes:     No lymphadenopathy noted Axillary Nodes:     No palpable lymphadenopathy    Impression & Recommendations:  Problem # 1:  SINUSITIS- ACUTE-NOS (ICD-461.9)  His updated medication list for this problem includes:    Promethazine-codeine 6.25-10 Mg/81ml Syrp (Promethazine-codeine) .Marland Kitchen... 1-2 tsp q 6 hrs prn    Amoxicillin-pot Clavulanate 875-125 Mg Tabs (Amoxicillin-pot clavulanate) .Marland Kitchen... 1 q12 hrs with a meal   Problem # 2:  BRONCHITIS-ACUTE (ICD-466.0)  His updated medication list for this problem includes:    Promethazine-codeine 6.25-10  Mg/20ml Syrp (Promethazine-codeine) .Marland Kitchen... 1-2 tsp q 6 hrs prn    Amoxicillin-pot Clavulanate 875-125 Mg Tabs (Amoxicillin-pot clavulanate) .Marland Kitchen... 1 q12 hrs with a meal   Complete Medication List: 1)  Metoprolol Tartrate 50 Mg Tabs (Metoprolol tartrate) .... 1/2 tab bid 2)  Simvastatin 40 Mg Tabs (Simvastatin) .Marland Kitchen.. 1 by mouth qd 3)  Niaspan 1000 Mg Tbcr (Niacin (antihyperlipidemic)) .Marland Kitchen.. 1 by mouth qd 4)  Ecasa 81mg   .... Daily 5)  Multivitamin  .Marland Kitchen.. 1 by mouth qd 6)  B Complex  .... Daily 7)  Fish Oil 1 Gram  .... Two  tabs daily 8)  Fiber 625mg   .... Takes 4 tabs daily 9)  Vit C 500mg   .... 2 tabs daily 10)  Claritin 10mg   .... 1 daily prn 11)  Promethazine-codeine 6.25-10 Mg/32ml Syrp (Promethazine-codeine) .Marland Kitchen.. 1-2 tsp q 6 hrs prn 12)  Amoxicillin-pot Clavulanate 875-125 Mg Tabs (Amoxicillin-pot clavulanate) .Marland Kitchen.. 1 q12 hrs with a meal   Patient Instructions: 1)  Drink as much fluid as you can tolerate for the next few days. Neti pot once daily as needed nasal congestion    Prescriptions: AMOXICILLIN-POT CLAVULANATE 875-125 MG  TABS (AMOXICILLIN-POT CLAVULANATE) 1 q12 hrs with a meal  #20 x 0   Entered and Authorized by:   Marga Melnick MD   Signed by:   Marga Melnick MD on 07/10/2007   Method used:   Print then Give to Patient   RxID:   [id number] PROMETHAZINE-CODEINE 6.25-10 MG/5ML  SYRP (PROMETHAZINE-CODEINE) 1-2 tsp q 6 hrs prn  #240cc x 0   Entered and Authorized by:   Marga Melnick MD   Signed by:   Marga Melnick MD on 07/10/2007   Method used:   Print then Give to Patient   RxID:   [id number]  ]

## 2010-09-20 NOTE — Assessment & Plan Note (Signed)
Summary: 1 yr rov 414.01  pfh,rn   Visit Type:  Follow-up Primary Provider:  Marga Melnick MD  CC:  CAD.  History of Present Illness: The patient presents for yearly followup. Since I last saw him he has done well. He is exercising more routinely than he used to. He denies any chest discomfort, neck or arm discomfort. He has had no shortness of breath, PND or orthopnea. He has had no palpitations, presyncope or syncope. He has had no weight gain or edema. He does complain of hot flashes with his niacin.  Current Medications (verified): 1)  Metoprolol Tartrate 68 Mg Tabs (Metoprolol Tartrate) .... Two Times A Day 2)  Simvastatin 40 Mg  Tabs (Simvastatin) .Marland Kitchen.. 1 By Mouth Qd 3)  Niaspan 1000 Mg  Tbcr (Niacin (Antihyperlipidemic)) .Marland Kitchen.. 1 By Mouth Qd 4)  Ecasa 81mg  .... Daily 5)  Multivitamin .Marland Kitchen.. 1 By Mouth Qd 6)  B Complex .... Daily 7)  Fish Oil 1 Gram .... Two Tabs Daily 8)  Fiber 625mg  .... Takes 4 Tabs Daily 9)  Vit C 500mg  .... 2 Tabs Daily 10)  Claritin 10mg  .... 1 Daily Prn 11)  Prilosec 20 Mg Cpdr (Omeprazole) .Marland Kitchen.. 1 By Mouth Once Daily  Allergies (verified): 1)  ! Norvasc 2)  ! * Ivp Dye  Past History:  Past Medical History: Reviewed history from 06/03/2009 and no changes required. Elevated Alk Phosphatase Hyperlipidemia, Hypertension Allergic rhinitis Nephrolithiasis, hx of CAD  Past Surgical History: Reviewed history from 06/03/2009 and no changes required. 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 Carotid endarterectomy 1999, complicated by TIA  Review of Systems       As stated in the HPI and negative for all other systems.   Vital Signs:  Patient profile:   70 year old male Height:       68 inches Weight:      225 pounds BMI:     34.33 Pulse rate:   75 / minute Resp:     16 per minute BP sitting:   136 / 85  (right arm)  Vitals Entered By: Marrion Coy, CNA (May 05, 2010 1:30 PM)  Physical Exam  General:  Well developed, well nourished, in no acute distress. Head:  normocephalic and atraumatic Eyes:  PERRLA/EOM intact; conjunctiva and lids normal. Mouth:  Teeth, gums and palate normal. Oral mucosa normal. Neck:  Neck supple, no JVD. No masses, thyromegaly or abnormal cervical nodes. Chest Wall:  Well healed sternotomy scar Lungs:  Clear bilaterally to auscultation and percussion. Abdomen:  Bowel sounds positive; abdomen soft and non-tender without masses, organomegaly, or hernias noted. No hepatosplenomegaly. Msk:  Back normal, normal gait. Muscle strength and tone normal. Extremities:  Right lower saphenous vein graft harvest site intact, trace edema Neurologic:  Alert and oriented x 3. Skin:  Intact without lesions or rashes. Cervical Nodes:  no significant adenopathy Inguinal Nodes:  no significant adenopathy Psych:  Normal affect.   Detailed Cardiovascular Exam  Neck    Carotids: Carotids full and equal bilaterally without bruits.      Neck Veins: Normal, no JVD.    Heart    Inspection: no deformities or lifts noted.  Palpation: normal PMI with no thrills palpable.      Auscultation: regular rate and rhythm, S1, S2 without murmurs, rubs, gallops, or clicks.    Vascular    Abdominal Aorta: no palpable masses, pulsations, or audible bruits.      Femoral Pulses: normal femoral pulses bilaterally.      Pedal Pulses: normal pedal pulses bilaterally.      Radial Pulses: normal radial pulses bilaterally.      Peripheral Circulation: no clubbing, cyanosis, or edema noted with normal capillary refill.     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 Because of this I will perform an  exercise treadmill test. He will continue with risk reduction.  Problem # 2:  OVERWEIGHT/OBESITY (ICD-278.02) We again discussed the need for weight loss.  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 He has 40-59% right stenosis and 0-39% left stenosis and will have followup again this spring.  Problem # 4:  HYPERLIPIDEMIA (ICD-272.4) I reviewed the lipids done with him recently. His HDL is 38. His LDL is in the 80s. This isn't near target but slightly worse in last year. I discussed dietary restrictions. He eats a lot of ice cream. For now I will not change his meds.  Problem # 5:  HYPERTENSION (ICD-401.9) His blood pressure is controlled. He will continue the meds as listed.  Other Orders: Treadmill (Treadmill)  Patient Instructions: 1)  Your physician recommends that you schedule a follow-up appointment at the time of the treadmill 2)  Your physician recommends that you continue on your current medications as directed. Please refer to the Current Medication list given to you today. 3)  Your physician has requested that you have an exercise tolerance test.  For further information please visit [email or web address]  Please also follow instruction sheet, as given. Prescriptions: METOPROLOL TARTRATE 25 MG TABS (METOPROLOL TARTRATE) two times a day  #180 x 3   Entered by:   Charolotte Capuchin, RN   Authorized by:   Rollene Rotunda, MD, [facility]   Signed by:   Charolotte Capuchin, RN on 68/15/2011   Method used:   Electronically to        [facility] [address] (retail)       [address]       [place], [place]  [address]       Ph: [phone]       Fax: [phone]   RxID:   [id number] NIASPAN 1000 MG  TBCR (NIACIN (ANTIHYPERLIPIDEMIC)) 1 by mouth qd  #90 x 3   Entered by:   Charolotte Capuchin, RN   Authorized by:   Rollene Rotunda, MD, [facility]   Signed by:   Charolotte Capuchin, RN on 68/15/2011   Method used:   Faxed to ...       [facility] (mail-order)             , [place]         Ph:        Fax: [phone]   RxID:   [id number] SIMVASTATIN 40 MG  TABS (SIMVASTATIN) 1 by mouth qd  #90 x 3   Entered by:   Charolotte Capuchin, RN   Authorized by:   Rollene Rotunda, MD, [facility]   Signed by:   Charolotte Capuchin, RN on 68/15/2011   Method used:  Faxed to ...       [facility] (mail-order)             , [place]         Ph:        Fax: [phone]   RxID:   [id number] PRILOSEC 20 MG CPDR (OMEPRAZOLE) 1 by mouth once daily  #90 x 3   Entered by:   Charolotte Capuchin, RN   Authorized by:   Rollene Rotunda, MD, [facility]   Signed by:   Charolotte Capuchin, RN on 68/15/2011   Method used:   Faxed to ...       [facility] (mail-order)             , [place]         Ph:        Fax: [phone]   RxID:   [id number]

## 2010-09-20 NOTE — Letter (Signed)
Summary: Results Follow-up Letter  [place] [facility]  [address] [place], [place] [address]   Phone: [phone]  Fax: [phone]    07/21/2008        [facility] [place]. [place] [address] [place], [place]  [address]  Dear Mr. Burdo,   The following are the results of your recent test(s):  Test     Result     Pap Smear    Normal_______  Not Normal_____       Comments: _________________________________________________________ Cholesterol LDL(Bad cholesterol):          Your goal is less than:         HDL (Good cholesterol):        Your goal is more than: _________________________________________________________ Other Tests:   _________________________________________________________  Please call for an appointment Or __Please see attached labwork._______________________________________________________ _________________________________________________________ _________________________________________________________  Sincerely,  Ardyth Man [place] [facility]

## 2010-09-20 NOTE — Assessment & Plan Note (Signed)
Summary: F1Y PER CHECK OIUT/LG  Medications Added METOPROLOL TARTRATE 25 MG TABS (METOPROLOL TARTRATE) two times a day      Allergies Added:   Visit Type:  Follow-up Primary Candus Braud:  Marga Melnick MD  CC:  CAD.  History of Present Illness: The patient returns for one year followup. Since I last saw him he has done well from a cardiovascular standpoint. He has been bothered mostly by kidney stones. Prior to these he was able to lose 20 pounds with diet. However, he gained it all back. He denies any cardiovascular symptoms such as chest discomfort, neck or arm discomfort. He has had no palpitations, presyncope or syncope. He denies any PND or orthopnea.  Current Medications (verified): 1)  Metoprolol Tartrate 25 Mg Tabs (Metoprolol Tartrate) .... Two Times A Day 2)  Simvastatin 40 Mg  Tabs (Simvastatin) .Marland Kitchen.. 1 By Mouth Qd 3)  Niaspan 1000 Mg  Tbcr (Niacin (Antihyperlipidemic)) .Marland Kitchen.. 1 By Mouth Qd 4)  Ecasa 81mg  .... Daily 5)  Multivitamin .Marland Kitchen.. 1 By Mouth Qd 6)  B Complex .... Daily 7)  Fish Oil 1 Gram .... Two Tabs Daily 8)  Fiber 625mg  .... Takes 4 Tabs Daily 9)  Vit C 500mg  .... 2 Tabs Daily 10)  Claritin 10mg  .... 1 Daily Prn 11)  Prilosec 20 Mg Cpdr (Omeprazole) .Marland Kitchen.. 1 By Mouth Once Daily 12)  Ventolin Hfa 108 (90 Base) Mcg/act Aers (Albuterol Sulfate) .Marland Kitchen.. 1-2 Puffs 15-30 Min Pre Exercise  Allergies (verified): 1)  ! Norvasc 2)  ! * Ivp Dye  Past History:  Past Medical History: Elevated Alk Phosphatase Hyperlipidemia, Hypertension Allergic rhinitis Nephrolithiasis, hx of CAD  Past Surgical History: Colonoscopy 2003;  Colon polypectomy 2009, Dr Jarold Motto Coronary artery bypass graft (1999 with     left internal mammary artery to the left anterior descending,     saphenous vein graft to obtuse marginal 1, saphenous vein graft to     obtuse marginal 2, saphenous vein graft to the right coronary     artery). Carotid endarterectomy 1999, complicated by  TIA  Review of Systems       As stated in the HPI and negative for all other systems.   Vital Signs:  Patient profile:   70 year old male Height:      68 inches Weight:      225 pounds BMI:     34.33 Pulse rate:   80 / minute Resp:     16 per minute BP sitting:   128 / 73  (right arm)  Vitals Entered By: Marrion Coy, CNA (June 03, 2009 4:21 PM)  Physical Exam  General:  Well developed, well nourished, in no acute distress. Head:  normocephalic and atraumatic Mouth:  Teeth, gums and palate normal. Oral mucosa normal. Neck:  Neck supple, no JVD. No masses, thyromegaly or abnormal cervical nodes. Chest Wall:  well healed sternotomy scar Lungs:  Clear bilaterally to auscultation and percussion. Abdomen:  Bowel sounds positive; abdomen soft and non-tender without masses, organomegaly, or hernias noted. No hepatosplenomegaly. Msk:  Back normal, normal gait. Muscle strength and tone normal. Extremities:  No clubbing or cyanosis. Neurologic:  Alert and oriented x 3. Skin:  Intact without lesions or rashes. Cervical Nodes:  no significant adenopathy Axillary Nodes:  no significant adenopathy Inguinal Nodes:  no significant adenopathy Psych:  Normal affect.   Detailed Cardiovascular Exam  Neck    Carotids: Carotids full and equal bilaterally without bruits.      Neck Veins:  Normal, no JVD.    Heart    Inspection: no deformities or lifts noted.      Palpation: normal PMI with no thrills palpable.      Auscultation: regular rate and rhythm, S1, S2 without murmurs, rubs, gallops, or clicks.    Vascular    Abdominal Aorta: no palpable masses, pulsations, or audible bruits.      Femoral Pulses: normal femoral pulses bilaterally.      Pedal Pulses: normal pedal pulses bilaterally.      Radial Pulses: normal radial pulses bilaterally.      Peripheral Circulation: no clubbing, cyanosis, or edema noted with normal capillary refill.     EKG  Procedure date:   06/03/2009  Findings:      sinus rhythm, rate 80, axis within normal limits, intervals within normal limits, no acute ST-T wave changes.  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 He has no ongoing cardiovascular complaints and no further testing is indicated. He will continue with risk reduction. Orders: EKG w/ Interpretation (93000)  Problem # 2:  OVERWEIGHT/OBESITY (ICD-278.02) He understands the need to lose weight with diet and exercise.  Problem # 3:  HYPERLIPIDEMIA (ICD-272.4) I reviewed his lipid profile. His LDL is excellent as is his total cholesterol though his HDL is in the 30s. This has been stable. I encouraged continued exercise to bring this up.  Problem # 4:  HYPERTENSION (ICD-401.9) His blood pressure is well controlled and he will continue the meds as listed.  Patient Instructions: 1)  Your physician recommends that you schedule a follow-up appointment in: 12 months with Dr Antoine Poche 2)  Your physician recommends that you continue on your current medications as directed. Please refer to the Current Medication list given to you today.

## 2010-09-20 NOTE — Procedures (Signed)
Summary: Colonoscopy  Colonoscopy   Imported By: Freddy Jaksch 11/18/2007 11:06:42  _____________________________________________________________________  External Attachment:    Type:   Image     Comment:   External Document

## 2010-09-20 NOTE — Assessment & Plan Note (Signed)
Summary: Cardiology Nuclear Testing  Nuclear Med Background Indications for Stress Test: Evaluation for Ischemia, Graft Patency   History: CABG, COPD, GXT, Myocardial Perfusion Study  History Comments: '99 CABG x4. '06 MPS: (-) ischemia, EF=63%. 06/07/10 GXT: Abn. ST changes.     Nuclear Pre-Procedure Cardiac Risk Factors: Carotid Disease, Family History - CAD, Hypertension, Lipids Caffeine/Decaff Intake: None NPO After: 6:30 PM Lungs: clear IV 0.9% NS with Angio Cath: 22g     IV Site: R Hand IV Started by: Bonnita Levan, RN Chest Size (in) 46     Height (in): 68 Weight (lb): 221 BMI: 33.72  Nuclear Med Study 1 or 2 day study:  1 day     Stress Test Type:  Treadmill/Lexiscan Reading MD:  Cassell Clement, MD     Referring MD:  J.Hochrein Resting Radionuclide:  Technetium 76m Tetrofosmin     Resting Radionuclide Dose:  10.5 mCi  Stress Radionuclide:  Technetium 28m Tetrofosmin     Stress Radionuclide Dose:  33 mCi   Stress Protocol  Max Systolic BP: 172 mm Hg Lexiscan: 0.4 mg   Stress Test Technologist:  Milana Na, EMT-P Rest Procedure  Myocardial perfusion imaging was performed at rest 45 minutes following the intravenous administration of Technetium 51m Tetrofosmin.  Stress Procedure  The patient received IV Lexiscan 0.4 mg over 15-seconds with concurrent low level exercise and then Technetium 58m Tetrofosmin was injected at 30-seconds while the patient continued walking one more minute.  There were no significant changes and + sob  with Lexiscan.  Quantitative spect images were obtained after a 45 minute delay.  QPS Raw Data Images:  Normal; no motion artifact; normal heart/lung ratio. Stress Images:  Normal homogeneous uptake in all areas of the myocardium. Rest Images:  Normal homogeneous uptake in all areas of the myocardium. Subtraction (SDS):  No evidence of ischemia. Transient Ischemic Dilatation:  1.03  (Normal <1.22)  Lung/Heart Ratio:  0.24  (Normal  <0.45)  Quantitative Gated Spect Images QGS EDV:  105 ml QGS ESV:  35 ml QGS EF:  66 % QGS cine images:  Normal LV wall motion and systolic thickening.  Findings Normal nuclear study      Overall Impression  Exercise Capacity: Lexiscan with mild  exercise. BP Response: Normal blood pressure response. Clinical Symptoms: No chest pain.  Mild dyspnea. ECG Impression: Insignificant upsloping ST segment depression.  Occasional PVCs. Overall Impression: Normal stress nuclear study.  Appended Document: Cardiology Nuclear Testing Normal.  No evidence of ischemia.  Appended Document: Cardiology Nuclear Testing pt aware of results

## 2010-09-20 NOTE — Progress Notes (Signed)
Summary: WILL PT NEED LABS BEFORE APPT  Phone Note Call from Patient Call back at Home Phone [phone]   Caller: Spouse Reason for Call: Talk to Nurse Summary of Call: PT IS SCHED. FOR [facility] VIST 9/15.  WILL HE NEED LABS FASTING LABS BEFORE. Initial call taken by: Omar Person,  Dec 21, 2009 10:57 AM  Follow-up for Phone Call        pt on simvastatin - do you want a L/L?  anything else? Sander Nephew, RN  Additional Follow-up for Phone Call Additional follow up Details #1::        He can have a fasting lipid and liver prior to that if one is not done in the next 4 months before that appt. Additional Follow-up by: Rollene Rotunda, MD, [facility],  Dec 21, 2009 5:33 PM    Additional Follow-up for Phone Call Additional follow up Details #2::    Called patient and left message on machine that he can have fasting lipid and liver profile before the September appt if he hasnt had it done anywhere else in the next 4 months.  Pt to call back to schedule labs Follow-up by: Charolotte Capuchin, RN,  Dec 22, 2009 2:39 PM

## 2010-09-20 NOTE — Progress Notes (Signed)
Summary: dicuss medication   Phone Note Call from Patient Call back at Home Phone [phone] Message from:  Patient on July 20, 2009 11:39 AM  Caller: Spouse- JUDY Reason for Call: Talk to Nurse Details for Reason: was told to call back to discuss medication .  Initial call taken by: Lorne Skeens,  July 20, 2009 11:39 AM  Follow-up for Phone Call        [facility] - needs rx for niaspan and simvastatin for #90 supply with refills and the other  medication (metoprolol) to [facility] in [place] on [address] with 3 refills. Follow-up by: Charolotte Capuchin, RN,  July 20, 2009 12:06 PM    Prescriptions: METOPROLOL TARTRATE 25 MG TABS (METOPROLOL TARTRATE) two times a day  #180 x 3   Entered by:   Charolotte Capuchin, RN   Authorized by:   Rollene Rotunda, MD, [facility]   Signed by:   Charolotte Capuchin, RN on 07/20/2009   Method used:   Electronically to        [facility] [address] (retail)       [address], [address]       [place], [place]  [address]       Ph: [phone]       Fax: [phone]   RxID:   [id number] NIASPAN 1000 MG  TBCR (NIACIN (ANTIHYPERLIPIDEMIC)) 1 by mouth qd  #90 x 3   Entered by:   Charolotte Capuchin, RN   Authorized by:   Rollene Rotunda, MD, [facility]   Signed by:   Charolotte Capuchin, RN on 07/20/2009   Method used:   Faxed to ...       [facility] (mail-order)             , [place]         Ph:        Fax: [phone]   RxID:   [id number] SIMVASTATIN 40 MG  TABS (SIMVASTATIN) 1 by mouth qd  #90 x 3   Entered by:   Charolotte Capuchin, RN   Authorized by:   Rollene Rotunda, MD, [facility]   Signed by:   Charolotte Capuchin, RN on 07/20/2009   Method used:   Faxed to ...       [facility] (mail-order)             , [place]         Ph:        Fax: [phone]   RxID:   [id number]

## 2010-09-20 NOTE — Miscellaneous (Signed)
  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

## 2010-09-20 NOTE — Letter (Signed)
Summary: Appointment - Reminder 2  [facility], Main Office  [address]   [place], [place] [address]   Phone: [phone]  Fax: [phone]     April 08, 2009 MRN: [id number]   [facility] [place] [address] [place], [place]  [address]   Dear Todd [name],  Our records indicate that it is time to schedule a follow-up appointment.  Dr.Hochrein recommended that you follow up with [place] in Oct 2010. It is very important that we reach you to schedule this appointment. We look forward to participating in your health care needs. Please contact us at the number listed above at your earliest convenience to schedule your appointment.  If you are unable to make an appointment at this time, give [place] a call so we can update our records.     Sincerely,    Lorne Skeens  [facility] Scheduling Team

## 2010-09-20 NOTE — Progress Notes (Signed)
Summary: stress test results  Phone Note Call from Patient Call back at Home Phone [phone]   Caller: Spouse/judy Reason for Call: Talk to Nurse, Lab or Test Results Summary of Call: pt wife would like to know the stress test results and has question on meds Initial call taken by: Roe Coombs,  July 06, 2010 1:49 PM  Follow-up for Phone Call        Phone Call Completed, Rx Called In PT'S WIFE AWARE OF TEST RESULTS AND WANTING TO KNOW IF NEEDS TO CONT TO TAKE METOPROLOL SUCC 50 MG two times a day WAS RECENTLY INCREASE FROM 25 MG  two times a day PER  WIFE PT'S B/P AND HR RUNNING NORM AND PT HAS NO C/O AT THIS TIME INFORMED WILL FORWARD TO [facility] FOR REVIEW IFMED CHANGED WILL CALL BACK. Follow-up by: Scherrie Bateman, LPN,  July 06, 2010 2:10 PM    Prescriptions: METOPROLOL TARTRATE 50 MG TABS (METOPROLOL TARTRATE) one twice a day  #180 x 3   Entered by:   Scherrie Bateman, LPN   Authorized by:   Rollene Rotunda, MD, [facility]   Signed by:   Scherrie Bateman, LPN on 14/78/2956   Method used:   [facility]ronically to        [facility] (retail)       [address], [address]       [place], [place]  [address]       Ph: [phone]       Fax: [phone]   RxID:   [id number]

## 2010-09-20 NOTE — Letter (Signed)
Summary: Results Follow up Letter  [facility]  [address] [place], [place] [address]   Phone: [phone]  Fax: [phone]    07/23/2008 MRN: [id number]  [facility] [address] [place], [place]  [address]  Dear Todd [name],  The following are the results of your recent test(s):  Test         Result    Pap Smear:        Normal _____  Not Normal _____ Comments: ______________________________________________________ Cholesterol: LDL(Bad cholesterol):         Your goal is less than:         HDL (Good cholesterol):       Your goal is more than: Comments:  ______________________________________________________ Mammogram:        Normal _____  Not Normal _____ Comments:  ___________________________________________________________________ Hemoccult:        Normal _____  Not normal _______ Comments:    _____________________________________________________________________ Other Tests: PLEASE SEE ATTACHED CHEST X-RAY REPORT FROM 12.2.09    We routinely do not discuss normal results over the telephone.  If you desire a copy of the results, or you have any questions about this information we can discuss them at your next office visit.   Sincerely,

## 2010-09-20 NOTE — Assessment & Plan Note (Signed)
Summary: pneumonia immun/cbs  Nurse Visit   Allergies: 1)  ! Norvasc 2)  ! * Ivp Dye  Immunizations Administered:  Pneumonia Vaccine:    Vaccine Type: Pneumovax (Medicare)    Site: left deltoid    Mfr: Merck    Dose: 0.5 ml    Route: IM    Given by: Shonna Chock CMA    Exp. Date: 11/03/2011    Lot #: [id number]  Orders Added: 1)  Pneumococcal Vaccine [90732] 2)  Admin 1st Vaccine [04540]

## 2010-09-20 NOTE — Progress Notes (Signed)
Summary: lab work prior to appt   Phone Note Call from Patient   Reason for Call: Talk to Nurse Summary of Call: pt wants lab work put in for Oct. 7th at he [facility] office Initial call taken by: Omer Jack,  April 16, 2009 10:49 AM  Follow-up for Phone Call        will ask Dr Antoine Poche what labs he wants pt to have before appoitment.  Sander Nephew, RN  Additional Follow-up for Phone Call Additional follow up Details #1::        He will need a fasting lipid and liver. Called patient and left message on machine for pt to have fasting lipid and liver October 7th at the [facility] office.  He is to c/b he any questions Additional Follow-up by: Charolotte Capuchin, RN,  April 19, 2009 12:15 PM

## 2010-09-20 NOTE — Progress Notes (Signed)
Summary: Nuc. Pre-Procedure  Phone Note Outgoing Call Call back at [facility] Phone [phone]   Call placed by: Irean Hong, RN,  June 22, 2010 2:42 PM Summary of Call: Reviewed information on [employer] Information Sheet (see scanned document for further details).  Spoke with patient's son-in-law.     Nuclear Med Background Indications for Stress Test: Evaluation for Ischemia, Graft Patency   History: CABG, COPD, GXT, Myocardial Perfusion Study  History Comments: '99 CABG x4. '06 MPS: (-) ischemia, EF=63%. 06/07/10 GXT: Abn. ST changes.     Nuclear Pre-Procedure Cardiac Risk Factors: Carotid Disease, Family History - CAD, Hypertension, Lipids Height (in): 68

## 2010-09-22 NOTE — Medication Information (Signed)
Summary: High Risk Med/[employer]  High Risk Med/[employer]   Imported By: Lanelle Bal 08/17/2010 08:40:56  _____________________________________________________________________  External Attachment:    Type:   Image     Comment:   External Document

## 2010-09-26 ENCOUNTER — Encounter: Payer: Self-pay | Admitting: Internal Medicine

## 2010-09-28 NOTE — Progress Notes (Signed)
Summary: Refills to  [facility]  Phone Note Call from Patient   Caller: Todd [name] [phone] Reason for Call: Talk to Nurse Summary of Call: has med question Initial call taken by: Glynda Jaeger,  September 20, 2010 2:06 PM  Follow-up for Phone Call        insurance company was changed to [facility]  -  fax   [phone].   Meds needed Metoprolol tartrate  50 mg two times a day  #180, Simvastatin 40 mg daily #90, omperazole capsule 20 mg daily #90 Follow-up by: Charolotte Capuchin, RN,  September 20, 2010 2:49 PM    Prescriptions: PRILOSEC 20 MG CPDR (OMEPRAZOLE) 1 by mouth once daily  #90 x 3   Entered by:   Charolotte Capuchin, RN   Authorized by:   Rollene Rotunda, MD, [facility]   Signed by:   Charolotte Capuchin, RN on 09/20/2010   Method used:   Faxed to ...       [facility] (mail-order)             , [place]         Ph: [phone]       Fax: [phone]   RxID:   [id number] SIMVASTATIN 40 MG  TABS (SIMVASTATIN) 1 by mouth qd  #90 x 3   Entered by:   Charolotte Capuchin, RN   Authorized by:   Rollene Rotunda, MD, [facility]   Signed by:   Charolotte Capuchin, RN on 09/20/2010   Method used:   Faxed to ...       [facility] (mail-order)             , [place]         Ph: [phone]       Fax: [phone]   RxID:   [id number] METOPROLOL TARTRATE 50 MG TABS (METOPROLOL TARTRATE) one twice a day  #180 x 3   Entered by:   Charolotte Capuchin, RN   Authorized by:   Rollene Rotunda, MD, [facility]   Signed by:   Charolotte Capuchin, RN on 09/20/2010   Method used:   Faxed to ...       [facility] (mail-order)             , [place]         Ph: [phone]       Fax: [phone]   RxID:   [id number]

## 2010-10-12 NOTE — Letter (Signed)
Summary: Self Health Assessment/[employer]  Self Health Assessment/[employer]   Imported By: Maryln Gottron 10/06/2010 08:33:59  _____________________________________________________________________  External Attachment:    Type:   Image     Comment:   External Document

## 2010-11-18 ENCOUNTER — Telehealth: Payer: Self-pay | Admitting: Cardiology

## 2010-11-21 NOTE — Telephone Encounter (Signed)
Pt's wife calling stating that he is due for his yearly carotid doppler.  Pt's last carotid doppler was Dec 21, 2009.  Will forward to Iva Lento to be scheduled

## 2010-12-22 ENCOUNTER — Other Ambulatory Visit: Payer: Self-pay | Admitting: Cardiology

## 2010-12-22 DIAGNOSIS — I6529 Occlusion and stenosis of unspecified carotid artery: Secondary | ICD-10-CM

## 2010-12-23 ENCOUNTER — Encounter (INDEPENDENT_AMBULATORY_CARE_PROVIDER_SITE_OTHER): Payer: Medicare Other | Admitting: *Deleted

## 2010-12-23 ENCOUNTER — Other Ambulatory Visit: Payer: Self-pay | Admitting: *Deleted

## 2010-12-23 DIAGNOSIS — R0989 Other specified symptoms and signs involving the circulatory and respiratory systems: Secondary | ICD-10-CM

## 2010-12-23 DIAGNOSIS — I6529 Occlusion and stenosis of unspecified carotid artery: Secondary | ICD-10-CM

## 2010-12-26 ENCOUNTER — Encounter: Payer: Self-pay | Admitting: Cardiology

## 2011-01-03 NOTE — Assessment & Plan Note (Signed)
[facility]                            CARDIOLOGY OFFICE NOTE   Todd [name], Todd [name]                        MRN:          [id number]  DATE:05/21/2007                            DOB:          04-22-1941    PRIMARY CARE PHYSICIAN:  Dr. Alwyn Ren.   REASON FOR PRESENTATION:  Evaluate patient with coronary disease.   HISTORY OF PRESENT ILLNESS:  The patient returns for yearly followup.  He has done well since I last saw him.  He wasn't exercising for a while  but he is now back to exercising but a lot of this is lifting weights.  He is doing some aerobic stuff.  He denies any chest pressure, neck  discomfort, arm discomfort, activity-induced nausea, vomiting or  excessive diaphoresis.  He has had no palpitations, presyncope or  syncope.  He has had no PND or orthopnea.  He had his labs checked with  an excellent lipid profile as listed below.   PAST MEDICAL HISTORY:  1. Coronary artery disease (99% obtuse marginal 2 stenosis, 90% obtuse      marginal 1 stenosis, 90% proximal left anterior descending      stenosis, 90% dominant right coronary artery stenosis).  Ejection      fraction 60%.  Coronary artery bypass grafting (August 1999 with      left internal mammary artery to the left anterior descending,      saphenous vein graft to obtuse marginal 1, saphenous vein graft to      obtuse marginal 2, saphenous vein graft to the right coronary      artery).  Carotid endarterectomy on the left.  Patch angioplasty      (done concurrently with bypass).  2. Hypertension.  3. Hyperlipidemia.   ALLERGIES:  CONTRAST DYE caused a rash.  ACE INHIBITOR caused a cough.   CURRENT MEDICATIONS:  1. Metoprolol 25 mg b.i.d.  2. Vytorin 10/20 daily.  3. Diovan 160 mg daily.  4. Aspirin 81 mg daily.  5. Vitamin B complex, multivitamin, fish oil, vitamin C.  6. Simvastatin 40 mg daily.  7. Fibercon.  8. Niaspan 1,000 q.h.s.   REVIEW OF SYSTEMS:  As stated in the HPI,  otherwise negative for other  systems.   PHYSICAL EXAMINATION:  GENERAL APPEARANCE:  The patient is in no  distress.  VITAL SIGNS:  Blood pressure 148/98, heart rate 75 and regular, weight  220 pounds.  Body mass index 33.  HEENT:  Eyelids unremarkable.  Pupils equal, round, reactive to light.  Fundi not visualized.  Oral mucosa unremarkable.  NECK:  No jugular venous distention at 45 degrees.  Carotid upstroke  brisk and symmetric, no bruits, no thyromegaly. Well-healed left carotid  endarterectomy scar.  LYMPH NODES:  Lymphatics with no cervical, axillary, inguinal  adenopathy.  LUNGS:  Clear to auscultation bilaterally.  BACK:  No costovertebral angle tenderness.  CHEST:  Well-healed sternotomy scar.  HEART:  PMI not displaced or sustained, S1 and S2 within normal limits.  No S3, no S4, no clicks, no rubs, no murmurs.  ABDOMEN:  Obese, positive bowel sounds  that are normal in frequency and  pitch.  No bruits.  No rebound. No guarding.  No midline pulsatile  masses.  No organomegaly.  SKIN:  No rashes, no nodules.  EXTREMITIES:  2+ pulses.  No edema.  No cyanosis or clubbing.  NEUROLOGICAL:  Grossly intact.   ASSESSMENT/PLAN:  1. Coronary artery disease.  The patient is having no symptoms related      to this.  No further cardiovascular testing is suggested.  He will      continue with the medications as listed.  2. Obesity.  He has gained weight.  We discussed this and hopefully he      will lose weight with diet and exercise.  3. Dyslipidemia.  His lipids demonstrated an LDL of 72, HDL 38.9,      total cholesterol 119, triglycerides 43.  He will continue the      medications as listed.  He had normal liver enzymes.  I have asked      him to get these checked again in six months.  4. Hypertension.  Blood pressure is well controlled, though slightly      elevated today.  His wife does take it at home.  It is never      elevated.  She has an accurate blood pressure cuff.  I  will make no      change to his medical regimen.   FOLLOWUP:  I will see him back in one year, sooner if needed.     Rollene Rotunda, MD, [facility]  Electronically Signed    JH/MedQ  DD: 05/21/2007  DT: 05/22/2007  Job #: [id number]   cc:   Titus Dubin. Alwyn Ren, MD,FACP,FCCP

## 2011-01-03 NOTE — Assessment & Plan Note (Signed)
[facility]                            CARDIOLOGY OFFICE NOTE   Todd [name], Todd [name]                        MRN:          [id number]  DATE:05/26/2008                            DOB:          1941/07/11    PRIMARY CARE PHYSICIAN:  Dr. Alwyn Ren.   REASON FOR PRESENTATION:  Evaluate the patient with coronary artery  disease.   HISTORY OF PRESENT ILLNESS:  The patient presents for followup of the  above.  He has done well since I last saw him.  He has had no  cardiovascular complaints.  He states he feels better in the last year  than he has in a while.  He has had no chest pressure, neck, or arm  discomfort.  He has had no palpitations, presyncope, or syncope.  He has  gained some weight.  He is not exercising as much as I would like,  though he does some weight lifting.  He does not do as much walking.   PAST MEDICAL HISTORY:  Coronary artery disease (see the May 21, 2007, note for details), carotid endarterectomy on the left, patch  angioplasty done concurrently with bypass, hypertension, and  hyperlipidemia.   ALLERGIES:  Intolerance to CONTRAST, causes rash; ACE INHIBITOR causes  cough.   MEDICATIONS:  1. Metoprolol 25 mg b.i.d.  2. Aspirin 81 mg daily.  3. B complex.  4. Multivitamin.  5. Fish oil.  6. Vitamin C.  7. Simvastatin 40 mg.  8. Fiber capsules.  9. Niaspan 1000 mg nightly.   REVIEW OF SYSTEMS:  As stated in the HPI and otherwise negative for  other systems.   PHYSICAL EXAMINATION:  GENERAL:  The patient is pleasant and in no  distress.  VITAL SIGNS:  Blood pressure 162/100, heart rate 76 and regular, weight  225 pounds, and body mass index 34.  HEENT:  Eyes are unremarkable, pupils are equal, round, and reactive to  light, fundi not visualized, oral mucosa unremarkable.  NECK:  No jugular venous distention at 45 degrees, carotid upstroke  brisk and symmetrical, no bruits, no thyromegaly.  LYMPHATICS:  No cervical,  axillary, or inguinal adenopathy.  LUNGS:  Clear to auscultation bilaterally.  BACK:  No costovertebral angle tenderness.  CHEST:  Unremarkable.  HEART:  PMI not displaced or sustained, S1 and S2 within normal limits,  no S3, no S4, no clicks, no rubs, no murmurs. ABDOMEN:  Obese, positive  bowel sounds.  Normal in frequency and pitch, no bruits, no rebound, no  guarding, no midline pulsatile mass.  No hepatomegaly or splenomegaly.  SKIN:  No rashes, no nodules.  EXTREMITIES:  Pulses are 2+, no edema.   ASSESSMENT AND PLAN:  1. Coronary artery disease.  The patient is having no cardiovascular      symptoms, no further cardiovascular testing is suggested.  He will      continue with aggressive attempts of risk reduction.  2. Hypertension.  Blood pressure is elevated today, but it is not at      home.  Therefore, they are just going to keep a blood pressure  diary and left me know what these readings are.  We may need to re-      adjust.  3. Dyslipidemia.  His HDL remains low, though his LDL is well      controlled.  I have discussed possibly going up on the Niaspan      which he would like to avoid because of side effects.  I talked to      him about how to take the Niaspan.  The other alternative is to      walk more and we discussed this at length.  4. Obesity.  He understands the need to lose weight with diet and      exercise, and we have emphasized this.  5. Peripheral vascular disease.  He does have carotid stenosis.  It      has been nonobstructive.  He is going to get this checked again in      April.  At that time, he will get a fasting lipid profile as well.      He had liver enzymes at the same time.  6. Followup.  We will see him back in 1 year or sooner if needed.     Rollene Rotunda, MD, [facility]  Electronically Signed    JH/MedQ  DD: 05/26/2008  DT: 05/27/2008  Job #: [id number]

## 2011-01-06 NOTE — H&P (Signed)
NAME:  Todd [name], Todd [name] NO.:  [id number]   MEDICAL RECORD NO.:  [id number]          PATIENT TYPE:  INP   LOCATION:  1843                         FACILITY:  [facility]   PHYSICIAN:  Learta Codding, M.D. LHCDATE OF BIRTH:  Aug 25, 1940   DATE OF ADMISSION:  06/12/2005  DATE OF DISCHARGE:                                HISTORY & PHYSICAL   PRIMARY CARE PHYSICIAN:  Titus Dubin. Alwyn Ren, M.D.   CARDIOLOGIST:  Rollene Rotunda, M.D.   REASON FOR ADMISSION:  Fatigue with possible hypotension documented at home  by the patient with systolic of 70 mmHg.   HISTORY OF PRESENT ILLNESS:  The patient is a 70 year old white male with a  history of coronary artery disease who was changed from Altace to Diovan in  June 2006 secondary to cough.  The patient reports that ever since that time  he has been complaining of fatigue and dizziness.  He reported this to Dr.  Antoine Poche when he was last seen in the office on June 06, 2005.  He also  reported dyspnea on exertion.  Cardiolite stress study was subsequently  ordered.  This demonstrated good exercise tolerance and no chest pain and no  evidence of perfusion abnormality.  The patient states that his symptoms are  episodic and usually occur 1 to 2 hours after he takes the Diovan.  In the  course of the day, it improves but still is symptomatic on exertion. He  denies any chest pressure or tightness in the chest either at rest or on  exertion.  He states that prior to 1999 he had rather typical chest pain  which he clearly does not have at the present time.  In the emergency room,  his EKG shows no acute ischemic changes. We are currently awaiting his  cardiac troponin markers.   ALLERGIES:  IVP DYE allergy and cough with ALTACE.   MEDICATIONS:  1.  Toprol 1/2 tablet 25 mg p.o. twice daily.  2.  Diovan  160 mg p.o. daily.  3.  Aspirin 81 mg p.o. daily.  4.  Vitorin 10/20.  5.  Multivitamins.   PAST MEDICAL HISTORY:  1.  Status post  catheterization and coronary artery bypass grafting in 1999,      LIMA to LAD, saphenous vein graft to first obtuse marginal, saphenous      vein graft to second obtuse marginal, saphenous vein graft to right      coronary artery.  2.  Status post carotid endarterectomy on the left with patch angioplasty      done concurrently with bypass.   SOCIAL HISTORY:  The patient lives in [place] with his wife.  He does not  smoke.  He does not drink.  He is in [employer].   FAMILY HISTORY:  Notable for myocardial infarction in mother at age 13.  Father died at age 14 with no known heart disease.  Brother had a myocardial  infarction and died at age 100.  He also has a niece that died of sudden  death at age 51.   REVIEW OF SYSTEMS:  Positive for hearing loss.  Positive for shortness of  breath, dyspnea on exertion.  No orthopnea.  No frequency or dysuria.  Positive for weakness.  No myalgias or arthralgias.  No nausea or vomiting.  No polyuria or polydipsia.  All other Review of Systems negative.   PHYSICAL EXAMINATION:  VITAL SIGNS:  Blood pressure 127/65, heart rate 80  beats per minute, respirations 20, temperature 98.8.  GENERAL:  White male in no apparent distress.  HEENT:  Normocephalic and atraumatic.  PERRLA.  EOMI.  NECK:  Supple.  Soft bruit right carotid.  HEART: Regular rate and rhythm with 2/6 systolic murmur.  LUNGS:  Clear breath sounds bilaterally.  SKIN: No rash or lesions.  ABDOMEN:  Soft, nontender without rebound or guarding.  GU/RECTAL:  Deferred.  EXTREMITIES:  No cyanosis, clubbing, or edema.  NEUROLOGIC:  The patient was alert and oriented and grossly nonfocal.   LABORATORY DATA:  Chest x-ray is pending.   EKG demonstrates normal sinus rhythm with no acute ischemic changes.   Laboratory work is currently pending.   PROBLEM LIST:  1.  Dizziness and weakness.      1.  Rule out orthostatic hypotension.  2.  Symptoms occur post Diovan.  3.  Hypertension, possible  white coat hypertension.  4.  Dyspnea on exertion.      1.  Negative exercise stress Cardiolite June 06, 2005.      2.  Rule out ischemia (unlikely).  5.  Status post coronary artery bypass grafting in 1999.  See details as      above.  6.  Peripheral vascular disease.      1.  Status post left carotid endarterectomy with patch angioplasty.      2.  Carotid bruit on the right side.   PLAN:  1.  The patient's symptoms appear to be secondary to orthostasis.  He      clearly become symptomatic every time he takes Diovan.  He states that      in the morning hours, he is essentially asymptomatic before he takes      Diovan, and he has well controlled blood pressure.  Will discontinue      Diovan.  2.  Check orthostatic blood pressure.  3.  I do not hear a subclavian bruit, and I doubt the patient has a      subclavian steal syndrome accounting for his symptoms.  However, given      his prior history of carotid endarterectomy and now with bruit on the      right      carotid, we will order carotid Doppolers and also vertebral to see if      there is retrograde flow.  4.  Check enzymes.  If they are negative, I do think the patient can be      discharged in the morning.  I think it is very unlikely that he will      require cardiac catheterization for above symptoms.      Learta Codding, M.D. [facility]  Electronically Signed     GED/MEDQ  D:  06/12/2005  T:  06/12/2005  Job:  [id number]   cc:   Titus Dubin. Alwyn Ren, M.D. [facility]  [phone] [place]  [place] [address]   Rollene Rotunda, M.D.  [address]  [address]  [place]  [place] [address]

## 2011-01-06 NOTE — Discharge Summary (Signed)
NAME:  Todd [name], COLAVITO NO.:  [id number]   MEDICAL RECORD NO.:  [id number]          PATIENT TYPE:  INP   LOCATION:  6531                         FACILITY:  [facility]   PHYSICIAN:  Rollene Rotunda, M.D.   DATE OF BIRTH:  May 31, 1941   DATE OF ADMISSION:  06/12/2005  DATE OF DISCHARGE:  06/13/2005                                 DISCHARGE SUMMARY   DISCHARGE DIAGNOSIS:  1.  Chest pressure, negative cardiac workup.  2.  Premature ventricular contractures/bigeminy.  3.  Carotid stenosis.   PAST MEDICAL HISTORY:  1.  Coronary artery disease, status post cardiac catheterization and      coronary artery bypass grafting in 1999 with a LIMA to the LAD,      saphenous vein graft to the first obtuse marginal, saphenous vein graft      to the second obtuse marginal, saphenous vein graft to the right      coronary artery.  2.  Status post carotid endarterectomy on the left with patch angioplasty      done concurrently with bypass.  3.  Allergies to IVP dye and a cough with Altace.   HOSPITAL COURSE:  [name] is a 70 year old Caucasian gentleman with known  history of coronary artery disease followed by Dr. Antoine Poche who presented on  the day of admission with possible hypotension documented at home by the  patient with a systolic of 70.  The patient has recently been changed from  Altace to Diovan in June 2006 secondary to cough.  The patient reported that  ever since that time he has been complaining of fatigue and dizziness.  He  also had complained of dyspnea on exertion when he was seen by Dr. Antoine Poche  in the office on June 06, 2005.  Cardiolite stress test was subsequently  done, this showed good exercise tolerance and no chest pain and no evidence  of perfusion abnormality.  On arrival to the emergency room, his EKG showed  no acute ischemic changes.  Cardiac enzymes were negative.  The patient was  admitted overnight for observation.  Orthostatic blood pressures did  not  tilt.  The patient did not have a subclavian bruit and Dr. Andee Lineman did not  feel that the patient had a subclavian steel syndrome accounting for his  symptoms.  However, the patient did have a bruit on his right carotid.  The  patient was admitted, heparin drip was initiated, Diovan was held.  Cardiac  enzymes continued to remain negative.  The patient's orthostatic blood  pressures were stable.  Dr. Antoine Poche was in to see the patient on the day of  discharge, vital signs stable, blood pressure 122/48, the patient is being  discharged home.  I have arranged for him to have a cardiac ultrasound today  in our office at 2:30.  Following his carotid ultrasound, he will have a  Holter monitor placed today around 3 p.m.   MEDICATIONS:  He is instructed to hold his Diovan until further instructions  from Dr. Antoine Poche.  He is to continue his aspirin 81 mg daily, his Vytorin  10/20 mg daily, his Metoprolol 25 mg b.i.d., he may resume his vitamins and  fish oil as previously taken.  He can return to work on October 26, activity  as tolerated.  He has a follow up appointment with Dr. Antoine Poche scheduled  for November 9 at 12 noon.   LABORATORY DATA:  Lab work this admission showed troponin less than 0.01,  hemoglobin 14.4, hematocrit 40.9.  TSH 4.023.  No pending lab work.   The patient is being discharged home with instructions as stated above.  He  and his wife understand to proceed to our office upon discharge and follow  up with Dr. Antoine Poche.  Duration of discharge 30 minutes.      Dorian Pod, NP    ______________________________  Rollene Rotunda, M.D.    MB/MEDQ  D:  06/13/2005  T:  06/13/2005  Job:  [id number]   cc:   Titus Dubin. Alwyn Ren, M.D. [facility]  [phone] [place]  [place] [address]

## 2011-01-06 NOTE — Assessment & Plan Note (Signed)
[place]                              CARDIOLOGY OFFICE NOTE   VOID, WORTHAM                        MRN:          [id number]  DATE:03/15/2006                            DOB:          1941/02/12    RETURN OFFICE VISIT FOR LIPID CLINIC:   PAST MEDICAL HISTORY:  Pertinent for hyperlipidemia, documented coronary  artery disease, carotid endarterectomy on the left, coronary artery bypass  graft, hypertension.   MEDICATIONS:  1.  Metoprolol 25 mg twice daily.  2.  Aspirin 81 mg daily.  3.  Multivitamin daily.  4.  Fish oil 2 g daily.  5.  Fiber capsules daily.  6.  Vitamin C daily.  7.  Simvastatin 40 mg daily.  8.  Niaspan 500 mg at bedtime.   LABORATORY DATA:  Total cholesterol is 135, triglyceride 77, HDL 36, LDL 84.  LFT within normal limits.  Bilirubin slightly elevated at 1.4.   ASSESSMENT:  Todd [name] is a pleasant, gentleman, who returns to lipid clinic  today with no chest pain, no shortness of breath, no muscle aches or pains.  He states that he is very active.  He walks three to five times a week and  two to four miles at a time.  He has a bone spur on his heel that is his  limiting factor, so depending on how that feels is what determines how far  he goes on his walks.  He has, however, increased his weight by two pounds.  However, he does state a heart-healthy diet.  He eats lots of fresh  vegetables out of his garden.  He eats Cheerios for breakfast.  He eats  salad for lunch and occasionally snacks on peanut butter Graham crackers,  maybe three to four a day.  It does not seem like he is eating in excess,  although I am sure there are some things he is not admitting to.  He states  that he feels great.  He has no problems.  He feels as good as he did before  he had any cardiac issues.  However, given his cholesterol panel, total  cholesterol with a goal of less than 200, triglycerides at goal of less than  150, HDL is less  a goal of greater than 40; however, significantly improved  since last visit, from 28 to 36.  LDL is greater than goal of less than 70.  He was switched from Vytorin 10/20 to simvastatin 40 mg.  I wonder if this  could have contributed to the increase in LDL.  He also had a 2-pound weight-  gain, which also could have contributed to increased LDL.   PLAN:  1.  Continue simvastatin 40 mg daily.  He is agreeable to increasing this      dose in the future, if needed.  2.  Increase Niaspan to 1000 mg daily.  He does state he has some hot      flashes with this.  I have encouraged him to take his aspirin in the      evening time with  a small snack 30 minutes before his Niaspan to see if      this decreases his flushing.  3.  Continue exercise.  4.  Continue heart-healthy diet.  5.  Followup visit in three months for lipid panel and LFTs.  I will make      any medication changes that will be needed at that time.                                  Bonnita Nasuti, PharmD    LC/MedQ  DD:  03/15/2006  DT:  03/15/2006  Job #:  [id number]

## 2011-01-06 NOTE — Discharge Summary (Signed)
[place]. [facility]  Patient:    Todd [name], Todd [name] Visit Number: [id number] MRN: [id number]          Service Type: MED Location: 2000 2024 01 Attending Physician:  Colon Branch Dictated by:   Tereso Newcomer, P.A.-C. Admit Date:  04/28/2001 Discharge Date: 04/30/2001   CC:         Titus Dubin. Alwyn Ren, M.D. [facility]   Discharge Summary  DATE OF BIRTH:  June 26, 1941  DISCHARGE DIAGNOSES: 1. Chest pain, etiology unclear. 2. Coronary artery disease. 3. Status post coronary artery bypass grafting (left internal mammary artery    to left anterior descending artery, left radial artery to first obtuse    marginal, saphenous vein graft to second obtuse marginal, saphenous vein    graft to right coronary artery; ejection fraction 60%). 4. Status post left carotid endarterectomy with coronary artery bypass    grafting in 1999. 5. Hypertension. 6. Positive family history of coronary artery disease. 7. Atherosclerotic peripheral vascular disease. 8. Nephrolithiasis. 9. Status post nasal surgery.  HOSPITAL COURSE:  This 70 year old male presented to the emergency room on April 28, 2001 with complaints of right-sided chest pain and substernal chest pain associated with increased burping and worse with exertion.  He describes shortness of breath and fatigue as well as increased dyspnea with exertion associated with his symptoms.  He had recently discontinued exercise due to increased pain.  Initial exam revealed a blood pressure 132/90, pulse 69, respirations 20.  Neck with no bruits or JVD, positive CEA scar on the left.  Chest was clear to auscultation bilaterally.  Cardiac with regular rate and rhythm, positive S4, no significant murmurs or rubs.  Abdomen was soft, nontender.  Extremities without edema.  EKG showed sinus rhythm, no acute changes, first degree block.  Chest x-ray showed cardiac enlargement with vascular congestion, no frank edema.  The patient  was admitted for chest pain. He eventually ruled out for MI by enzymes.  He could not be added on to the schedule for exercise treadmill Cardiolite on April 29, 2001.  Given his symptoms, it was felt he should stay in the hospital until his Cardiolite could be completed.  On April 30, 2001 he underwent gaited exercise treadmill Cardiolite.  He exercised for seven minutes 35 seconds on Bruce protocol.  He achieved stage 2.  His heart rate increased to 140.  He had no chest pain or dyspnea and no EKG changes.  His scintigraphic images revealed no ischemia, EF 59%.  Therefore, it was felt he was ready for discharge to home.  Dr. Antoine Poche is his primary cardiologist and saw the patient on September 9 and September 10.  He felt the patient should be discharged on Nexium 40 mg daily and should also be given a prescription for p.r.n. nitroglycerin.  He has been advised to see his primary care physician within the next week and Dr. Antoine Poche within the next month.  DISCHARGE MEDICATIONS: 1. Nexium 40 mg q.d. 2. Nitroglycerin spray p.r.n. chest pain. 3. Metoprolol 50 mg b.i.d. 4. Enteric-coated aspirin 325 mg q.d. 5. Zocor 20 mg q.h.s. 6. Altace 5 mg q.d. 7. Foltx q.d.  ACTIVITY:  As tolerated.  DIET:  Low fat, low sodium.  FOLLOW-UP:  With Dr. Alwyn Ren in the next week - he should call for an appointment.  Dr. Antoine Poche will see the patient on October 9 at 10 a.m. in [place].  LABORATORY DATA:  Sodium 143, potassium 4.5, chloride 108, CO2 20, creatinine  1.0, glucose 101.  White blood cell count 5100; hemoglobin 15; hematocrit 45; platelet count 144,000.  Cardiac enzymes negative x 3.  Dictated by:   Tereso Newcomer, P.A.-C. Attending Physician:  Colon Branch DD:  04/30/01 TD:  04/30/01 Job: [id number]

## 2011-01-06 NOTE — Assessment & Plan Note (Signed)
[facility]                              CARDIOLOGY OFFICE NOTE   [name], Todd                        MRN:          [id number]  DATE:05/16/2006                            DOB:          1941/08/07    PRIMARY CARE PHYSICIAN:  Todd [name]. Todd Ren, MD,FACP,FCCP.   REASON FOR PRESENTATION:  Evaluate patient with coronary disease.   HISTORY OF PRESENT ILLNESS:  The patient returns for followup.  He has done  well since I last saw him.  He has not been doing as much aerobic exercise  as I would like.  He still lifts weights.  He denies any chest discomfort,  neck discomfort, arm discomfort, activity-induced nausea, vomiting,  excessive diuresis.  He has had no palpitations, presyncope, or syncope.  He  has had no PND or orthopnea.  He was seen in the lipid clinic and has been  on Niaspan.  He had an improvement in his HDL with an increase from 27.2 to  37.  His triglycerides are 64.  His total is 132.  His LDL is 82.   PAST MEDICAL HISTORY:  1. Coronary artery disease (see the June 03, 2005 note for details).  2. Carotid endarterectomy on the left.  3. Patch angioplasty done with concurrent bypass.  4. Hypertension.  5. Hyperlipidemia.  6. Premature ventricular contractions.   ALLERGIES:  1. CONTRAST DYE CAUSED A RASH.  2. ACE INHIBITOR CAUSE COUGH.   MEDICATIONS:  1. Metoprolol 25 mg b.i.d.  2. Vytorin 10/20 every day.  3. Aspirin 81 mg.  4. B complex.  5. Multivitamin.  6. Fish oil 2,000 mg a day.  7. Fiber capsules.  8. Vitamin C.  9. Simvastatin 40 mg q.p.m.  10.Niaspan 500 mg q.h.s.   REVIEW OF SYSTEMS:  As stated in the HPI and otherwise negative for other  systems.   PHYSICAL EXAMINATION:  GENERAL:  The patient is in no distress.  VITAL SIGNS:  Blood pressure 118/94, heart rate 73 and regular, weight 212  pounds.  Body mass index 32.  HEENT:  Eyes unremarkable.  Pupils are equal, round, and reactive to light.  Fundi not  visualized.  Oral mucosa unremarkable.  NECK:  No jugular venous distention.  Wave form within normal limits.  Carotid upstroke brisk and symmetric.  No bruits.  No thyromegaly.  LYMPHATICS:  No __________  .  LUNGS:  Clear to auscultation bilaterally.  BACK:  No costovertebral angle tenderness.  CHEST:  Unremarkable.  HEART:  PMI not displaced or sustained.  S1 and S2 within normal limits.  No  S3, no S4, no murmurs.  ABDOMEN:  Obese, positive bowel sounds, normal in frequency and pitch.  No  bruits.  No rebound.  No guarding.  No midline pulsatile mass.  No  organomegaly.  SKIN:  No rashes.  No nodules.  EXTREMITIES:  With 2+ pulses.  No edema.  NEUROLOGIC:  Grossly intact.   EKG:  Sinus rhythm, rate 73, axis within normal limits, intervals within  normal limits, no acute ST-T wave changes.   ASSESSMENT AND PLAN:  1. Coronary disease.  The patient is having no symptoms related to this.      No further cardiovascular testing is suggested.  He will continue with      aggressive risk reduction.  2. Dyslipidemia.  I have encouraged him to increase his Niaspan to 1,000      mg q.h.s.  He has been given written instructions to get his lipid and      liver tested in 8 weeks.  3. Hypertension.  Blood pressure is well controlled.  He will continue the      medications as listed.  4. Weight.  He understands the need to loose weight with diet and exercise      as he has a body mass index that puts him in the obese range.   FOLLOWUP:  I will see him back in 1 year or sooner if needed.  He knows I  would like for him to get his lipids and liver checked twice a year and his  wife will stay on top of this.            ______________________________  Rollene Rotunda, MD, [facility]     JH/MedQ  DD:  05/16/2006  DT:  05/18/2006  Job #:  [id number]

## 2011-04-26 ENCOUNTER — Ambulatory Visit (INDEPENDENT_AMBULATORY_CARE_PROVIDER_SITE_OTHER): Payer: Medicare Other | Admitting: Physician Assistant

## 2011-04-26 ENCOUNTER — Encounter: Payer: Self-pay | Admitting: Physician Assistant

## 2011-04-26 ENCOUNTER — Encounter: Payer: Self-pay | Admitting: Cardiology

## 2011-04-26 DIAGNOSIS — R0609 Other forms of dyspnea: Secondary | ICD-10-CM

## 2011-04-26 DIAGNOSIS — E785 Hyperlipidemia, unspecified: Secondary | ICD-10-CM

## 2011-04-26 DIAGNOSIS — R079 Chest pain, unspecified: Secondary | ICD-10-CM

## 2011-04-26 MED ORDER — NITROGLYCERIN 0.4 MG SL SUBL: 0.4000 mg | SUBLINGUAL_TABLET | SUBLINGUAL | Status: DC | PRN

## 2011-04-26 MED ORDER — ISOSORBIDE MONONITRATE ER 30 MG PO TB24: 30.0000 mg | ORAL_TABLET | Freq: Every day | ORAL | Status: DC

## 2011-04-26 NOTE — Assessment & Plan Note (Signed)
Patient had CABG in 1999 as listed above. We'll order a stress Myoview

## 2011-04-26 NOTE — Progress Notes (Signed)
Addended by: Reine Just on: 04/26/2011 01:49 PM   Modules accepted: Orders

## 2011-04-26 NOTE — Assessment & Plan Note (Signed)
Patient has long history of dyspnea on exertion. He has had S. This this exposure. Some of his dyspnea may be related to his coronary artery disease as well as decreased exercise tolerance. I do feel he has a pulmonary component as well.

## 2011-04-26 NOTE — Assessment & Plan Note (Signed)
Patient has chest tightness with mowing the lawn and has been going on for 3-4 years but is getting worse and lasting 15-30 minutes before it eases with rest. He also has worsening dyspnea on exertion which could be an anginal equivalent. We will order a stress Myoview to further assess this.

## 2011-04-26 NOTE — Assessment & Plan Note (Signed)
Patient's blood pressure is up today. Hopefully the Imdur will bring it down. I will also asked him to watch his sodium intake.

## 2011-04-26 NOTE — Assessment & Plan Note (Signed)
Patient is on simvastatin. It's been a year since his head a lipid panel. We will check.

## 2011-04-26 NOTE — Progress Notes (Signed)
HPI: This is a 70 year old married white male patient who has history of CABG in 1999 with a LIMA to the LAD, SVG to the OM1, SVG to the OM 2, and SVG to the RCA. He also had a carotid endarterectomy at that time complicated by TIA.  The patient presents today complaining of worsening dyspnea on exertion. He says he can walk a short distance then it completely out of breath but if he pushes himself he can continue to go on. He says it's like hitting a wall but if he gets through it he can keep going. He notices this when he is biking at [employer] also. His family says they notice it more than he does. Dr. Linna Darner given him an inhaler at one point for possible asthma but he says is never helped with his exercise tolerance or dyspnea.  The patient also complains of a chest tightness across his entire chest and his shoulders when he push mows his lawn. It is associated with dyspnea on exertion. He gets relief with rest after about 15-30 minutes. He has not used her nitroglycerin.  Allergies  Allergen Reactions  . Amlodipine Besylate   . Ivp Dye (Iodinated Diagnostic Agents)     No current outpatient prescriptions on file prior to visit.    Past Medical History  Diagnosis Date  . Elevated alkaline phosphatase level   . HLD (hyperlipidemia)   . HTN (hypertension)   . Allergic rhinitis   . Nephrolithiasis   . CAD (coronary artery disease)     Past Surgical History  Procedure Date  . Colonoscopy w/ polypectomy 2009  . Coronary artery bypass graft 1999  . Carotid endarterectomy 1999    Family History  Problem Relation Age of Onset  . Diabetes    . Coronary artery disease    . Asthma    . Prostate cancer    . Hemochromatosis    . Heart attack      History   Social History  . Marital Status: Married    Spouse Name: N/A    Number of Children: N/A  . Years of Education: N/A   Occupational History  . Tree surgeon    Social History Main Topics  . Smoking status: Never  Smoker   . Smokeless tobacco: Not on file  . Alcohol Use: Yes  . Drug Use: Not on file  . Sexually Active: Not on file   Other Topics Concern  . Not on file   Social History Narrative  . No narrative on file    ROS: See HPI Eyes: Negative Ears:Negative for hearing loss, tinnitus Cardiovascular: Negative for palpitations,irregular heartbeat,  near-syncope, orthopnea, paroxysmal nocturnal dyspnia and syncope,edema, claudication, cyanosis,.  Respiratory:   Negative for cough, hemoptysis,  sleep disturbances due to breathing, sputum production and wheezing.   Endocrine: Negative for cold intolerance and heat intolerance.  Hematologic/Lymphatic: Negative for adenopathy and bleeding problem. Does not bruise/bleed easily.  Musculoskeletal: Negative.   Gastrointestinal: Negative for nausea, vomiting, reflux, abdominal pain, diarrhea, constipation.   Neurological: Negative.  Allergic/Immunologic: Negative for environmental allergies.  PHYSICAL EXAM: Well-nournished, in no acute distress. Neck: No JVD, HJR, Bruit, or thyroid enlargement Lungs: No tachypnea, clear without wheezing, rales, or rhonchi Cardiovascular: RRR, PMI not displaced, Positive S4, no murmurs, gallops, bruit, thrill, or heave. Abdomen: BS normal. Soft without organomegaly, masses, lesions or tenderness. Extremities: without cyanosis, clubbing or edema. Good distal pulses bilateral SKin: Warm, no lesions or rashes  Musculoskeletal: No deformities Neuro:  no focal signs  BP 142/92  Pulse 68  Ht 5' 8"$  (1.727 m)  Wt 225 lb (102.059 kg)  BMI 34.21 kg/m2  [id number] sinus rhythm with first-degree AV block

## 2011-04-26 NOTE — Patient Instructions (Addendum)
Your physician recommends that you schedule a follow-up appointment in: 2-4 weeks with Dr Antoine Poche  Your physician has requested that you have en exercise stress myoview. For further information please visit [email or web address] Please follow instruction sheet, as given.  Your physician has recommended you make the following change in your medication: START Imdur 30 mg daily and KEEP Ntg on hand for chest pain  Your physician recommends that you return for lab work in: lipid and liver when you get your testing

## 2011-05-01 ENCOUNTER — Telehealth: Payer: Self-pay | Admitting: Cardiology

## 2011-05-01 NOTE — Telephone Encounter (Signed)
Pt wife calling regarding pt c/o negative side effects of medication pt just recently started taking. Pt c/o having headaches all day long, pt wanted to know if this is normal. Please return call to discuss further.

## 2011-05-01 NOTE — Telephone Encounter (Signed)
Pt is reporting that he was prescribed Imdur at his appt and has been having a headache since Thursday.  He is attributing it to the Imdur.  He states the Imdur did help his sob.  The headache is in the middle of his head, radiating down the back of his head to his neck and over to the left side of his neck.  He has tried tylenol for the headache without relief.

## 2011-05-01 NOTE — Telephone Encounter (Signed)
Per Dr Shirlee Latch, pt to cut his Imdur to one-half tab.  Pt was notified.

## 2011-05-02 ENCOUNTER — Encounter: Payer: Self-pay | Admitting: *Deleted

## 2011-05-02 NOTE — Telephone Encounter (Signed)
Taking 1/2 tablet of Imdur did not decrease h/a.  Pt did not take Imdur at all today and h/a is better.  Discussed with wife for pt to take his ASA 1/2 hour before taking Imdur and that he may want to take it at bedtime instead of in the AM.  Per wife - pt will try this tonight and continue if he does not have a headache.  She is aware that it can take approximately 2 weeks for pts body to adjust to the medication.  If his head ache continues - he will hold Imdur until he has his myoview.

## 2011-05-02 NOTE — Telephone Encounter (Signed)
Pt wife calling to report that pt is having same symptoms after cutting medicine in half please return call to discuss further.

## 2011-05-10 ENCOUNTER — Other Ambulatory Visit (INDEPENDENT_AMBULATORY_CARE_PROVIDER_SITE_OTHER): Payer: Medicare Other | Admitting: *Deleted

## 2011-05-10 DIAGNOSIS — E785 Hyperlipidemia, unspecified: Secondary | ICD-10-CM

## 2011-05-10 LAB — LIPID PANEL
Cholesterol: 130 mg/dL (ref 0–200)
HDL: 45.6 mg/dL (ref >39.00)
LDL Cholesterol: 65 mg/dL (ref 0–99)
Total CHOL/HDL Ratio: 3
Triglycerides: 98 mg/dL (ref 0.0–149.0)
VLDL: 19.6 mg/dL (ref 0.0–40.0)

## 2011-05-10 LAB — HEPATIC FUNCTION PANEL
ALT: 36 U/L (ref 0–53)
AST: 40 U/L — ABNORMAL HIGH (ref 0–37)
Albumin: 3.8 g/dL (ref 3.5–5.2)
Alkaline Phosphatase: 82 U/L (ref 39–117)
Bilirubin, Direct: 0 mg/dL (ref 0.0–0.3)
Total Bilirubin: 0.5 mg/dL (ref 0.3–1.2)
Total Protein: 7.4 g/dL (ref 6.0–8.3)

## 2011-05-11 ENCOUNTER — Other Ambulatory Visit: Payer: Self-pay | Admitting: *Deleted

## 2011-05-11 ENCOUNTER — Telehealth: Payer: Self-pay | Admitting: Cardiology

## 2011-05-11 ENCOUNTER — Ambulatory Visit (HOSPITAL_COMMUNITY): Payer: Medicare Other | Attending: Cardiology | Admitting: Radiology

## 2011-05-11 DIAGNOSIS — Z79899 Other long term (current) drug therapy: Secondary | ICD-10-CM

## 2011-05-11 DIAGNOSIS — R945 Abnormal results of liver function studies: Secondary | ICD-10-CM

## 2011-05-11 DIAGNOSIS — R0609 Other forms of dyspnea: Secondary | ICD-10-CM

## 2011-05-11 DIAGNOSIS — I4949 Other premature depolarization: Secondary | ICD-10-CM

## 2011-05-11 DIAGNOSIS — R0989 Other specified symptoms and signs involving the circulatory and respiratory systems: Secondary | ICD-10-CM | POA: Insufficient documentation

## 2011-05-11 DIAGNOSIS — I10 Essential (primary) hypertension: Secondary | ICD-10-CM | POA: Insufficient documentation

## 2011-05-11 DIAGNOSIS — R002 Palpitations: Secondary | ICD-10-CM | POA: Insufficient documentation

## 2011-05-11 DIAGNOSIS — Z8249 Family history of ischemic heart disease and other diseases of the circulatory system: Secondary | ICD-10-CM | POA: Insufficient documentation

## 2011-05-11 DIAGNOSIS — Z8673 Personal history of transient ischemic attack (TIA), and cerebral infarction without residual deficits: Secondary | ICD-10-CM | POA: Insufficient documentation

## 2011-05-11 DIAGNOSIS — R079 Chest pain, unspecified: Secondary | ICD-10-CM

## 2011-05-11 MED ORDER — TECHNETIUM TC 99M TETROFOSMIN IV KIT
11.0000 | PACK | Freq: Once | INTRAVENOUS | Status: AC | PRN
  Administered 2011-05-11: 11 via INTRAVENOUS

## 2011-05-11 MED ORDER — TECHNETIUM TC 99M TETROFOSMIN IV KIT
33.0000 | PACK | Freq: Once | INTRAVENOUS | Status: AC | PRN
  Administered 2011-05-11: 33 via INTRAVENOUS

## 2011-05-11 NOTE — Progress Notes (Signed)
[facility] SITE 3 NUCLEAR MED [address] [place] [address] [phone]  Cardiology Nuclear Med Todd [name] is a 70 y.o. male [id number] 1941/04/30   Nuclear Med Background Indication for Stress Test:  Evaluation for Ischemia and Graft Patency History: '99 CABG, 10/11 GXT: ABN ST changes and 11/11 Myocardial Perfusion Study: EF 66% NL Cardiac Risk Factors: Carotid Disease, Family History - CAD, Hypertension, Lipids and TIA  Symptoms:  Chest Pain, DOE and Palpitations   Nuclear Pre-Procedure Caffeine/Decaff Intake:  None NPO After: 7:00pm   Lungs:  clear IV 0.9% NS with Angio Cath:  20g  IV Site: L Hand  IV Started by:  Bonnita Levan, RN  Chest Size (in):  48 Cup Size: n/a  Height: 5\' 8"  (1.727 m)  Weight:  222 lb (100.699 kg)  BMI:  Body mass index is 33.76 kg/(m^2). Tech Comments:  Patient held Metoprolol this x 24 hrs    Nuclear Med Study 1 or 2 day study: 1 day  Stress Test Type:  Stress  Reading MD: Willa Rough, MD  Order Authorizing Provider:  M.Lenze/J.Hochrein  Resting Radionuclide: Technetium 25m Tetrofosmin  Resting Radionuclide Dose: 11 mCi   Stress Radionuclide:  Technetium 59m Tetrofosmin  Stress Radionuclide Dose: 33 mCi           Stress Protocol Rest HR: 68 Stress HR: 160  Rest BP: 130/74 Stress BP: 186/84  Exercise Time (min): 7:00 METS: 8.50   Predicted Max HR: 152 bpm % Max HR: 105.26 bpm Rate Pressure Product: 21308   Dose of Adenosine (mg):  n/a Dose of Lexiscan: n/a mg  Dose of Atropine (mg): n/a Dose of Dobutamine: n/a mcg/kg/min (at max HR)  Stress Test Technologist: Milana Na, EMT-P  Nuclear Technologist:  Domenic Polite, CNMT     Rest Procedure:  Myocardial perfusion imaging was performed at rest 45 minutes following the intravenous administration of Technetium 40m Tetrofosmin. Rest ECG: NSR  Stress Procedure:  The patient exercised for 7:00.  The patient stopped due to fatigue and denied any  chest pain.  There were non specific ST-T wave changes and occ pvcs/v-bigeminy.  Technetium 59m Tetrofosmin was injected at peak exercise and myocardial perfusion imaging was performed after a brief delay. Stress ECG: No significant change from baseline ECG  QPS Raw Data Images:  Normal; no motion artifact; normal heart/lung ratio. Stress Images:  There is mild soft tissue attenuation of the anterior wall. Rest Images:  Same as stress. Subtraction (SDS):  No evidence of ischemia. Transient Ischemic Dilatation (Normal <1.22):  .94 Lung/Heart Ratio (Normal <0.45):  .35  Quantitative Gated Spect Images QGS EDV:  88 ml QGS ESV:  30 ml QGS cine images:  Normal Wall Motion QGS EF: 66%  Impression Exercise Capacity:  Fair exercise capacity. BP Response:  Normal blood pressure response. Clinical Symptoms:  No chest pain. ECG Impression:  No significant ST segment change suggestive of ischemia. Comparison with Prior Nuclear Study: No significant change from previous study  Overall Impression:  Normal stress nuclear study.  Willa Rough

## 2011-05-11 NOTE — Telephone Encounter (Signed)
Pt returning your call

## 2011-05-12 NOTE — Telephone Encounter (Signed)
PT'S WIFE AWARE OF MYOVIEW AND LAB RESULTS./CY

## 2011-05-12 NOTE — Progress Notes (Signed)
PT'S WIFE  AWARE OF MYOVIEW RESULTS ./CY 

## 2011-05-17 ENCOUNTER — Ambulatory Visit (INDEPENDENT_AMBULATORY_CARE_PROVIDER_SITE_OTHER): Payer: Medicare Other | Admitting: Cardiology

## 2011-05-17 ENCOUNTER — Encounter: Payer: Self-pay | Admitting: Cardiology

## 2011-05-17 DIAGNOSIS — I2510 Atherosclerotic heart disease of native coronary artery without angina pectoris: Secondary | ICD-10-CM

## 2011-05-17 DIAGNOSIS — E785 Hyperlipidemia, unspecified: Secondary | ICD-10-CM

## 2011-05-17 DIAGNOSIS — I6529 Occlusion and stenosis of unspecified carotid artery: Secondary | ICD-10-CM

## 2011-05-17 DIAGNOSIS — I10 Essential (primary) hypertension: Secondary | ICD-10-CM

## 2011-05-17 DIAGNOSIS — E663 Overweight: Secondary | ICD-10-CM

## 2011-05-17 MED ORDER — SIMVASTATIN 40 MG PO TABS: 40.0000 mg | ORAL_TABLET | Freq: Every day | ORAL | Status: DC

## 2011-05-17 MED ORDER — OMEPRAZOLE 20 MG PO CPDR: 20.0000 mg | DELAYED_RELEASE_CAPSULE | Freq: Every day | ORAL | Status: DC

## 2011-05-17 MED ORDER — METOPROLOL TARTRATE 50 MG PO TABS: 50.0000 mg | ORAL_TABLET | Freq: Two times a day (BID) | ORAL | Status: DC

## 2011-05-17 MED ORDER — NIACIN ER (ANTIHYPERLIPIDEMIC) 1000 MG PO TBCR: 1000.0000 mg | EXTENDED_RELEASE_TABLET | Freq: Every day | ORAL | Status: DC

## 2011-05-17 NOTE — Patient Instructions (Signed)
Stop Isosorbide, continue all other medications as listed  Follow up in 1 year with Dr Antoine Poche.  You will receive a letter in the mail 2 months before you are due.  Please call us when you receive this letter to schedule your follow up appointment.

## 2011-05-17 NOTE — Assessment & Plan Note (Signed)
We again discussed the need to lose weight.

## 2011-05-17 NOTE — Progress Notes (Signed)
HPI The patient presents for follow up of CAD.  He saw our PA recently and was complaining of dyspnea.  However stress Lexiscan Myoview demonstrated no ischemia and a well preserved EF.  Since I last visit he has done well.  The patient denies any new symptoms such as chest discomfort, neck or arm discomfort. There has been no new shortness of breath, PND or orthopnea. There have been no reported palpitations, presyncope or syncope.  He ascribes any breathlessness to being overweight.  Of note he did not tolerate Imdur.     Allergies  Allergen Reactions  . Amlodipine Besylate   . Ivp Dye (Iodinated Diagnostic Agents)     Current Outpatient Prescriptions  Medication Sig Dispense Refill  . aspirin 81 MG tablet Take 81 mg by mouth daily.        Marland Kitchen b complex vitamins tablet Take 1 tablet by mouth daily.        . Cholecalciferol (VITAMIN D-3) 5000 UNITS TABS Take by mouth daily.        . fish oil-omega-3 fatty acids 1000 MG capsule Take 2 g by mouth daily.        Marland Kitchen loratadine (CLARITIN) 10 MG tablet Take 10 mg by mouth daily as needed.        . metoprolol (LOPRESSOR) 50 MG tablet Take 50 mg by mouth 2 (two) times daily.        . Multiple Vitamin (MULTIVITAMIN) tablet Take 1 tablet by mouth daily.        . niacin (NIASPAN) 1000 MG CR tablet Take 1,000 mg by mouth at bedtime.        . nitroGLYCERIN (NITROSTAT) 0.4 MG SL tablet Place 1 tablet (0.4 mg total) under the tongue every 5 (five) minutes as needed for chest pain.  25 tablet  6  . omeprazole (PRILOSEC) 20 MG capsule Take 20 mg by mouth daily.        . polycarbophil (FIBERCON) 625 MG tablet Take 625 mg by mouth daily.        . simvastatin (ZOCOR) 40 MG tablet Take 40 mg by mouth at bedtime.        . vitamin C (ASCORBIC ACID) 500 MG tablet Take 1,000 mg by mouth daily.          Past Medical History  Diagnosis Date  . Elevated alkaline phosphatase level   . HLD (hyperlipidemia)   . HTN (hypertension)   . Allergic rhinitis   .  Nephrolithiasis   . CAD (coronary artery disease)     Past Surgical History  Procedure Date  . Colonoscopy w/ polypectomy 2009  . Coronary artery bypass graft 1999  . Carotid endarterectomy 1999    ROS:  As stated in the HPI and negative for all other systems.  PHYSICAL EXAM BP 142/80  Pulse 79  Resp 16  Ht 5\' 8"  (1.727 m)  Wt 224 lb (101.606 kg)  BMI 34.06 kg/m2 GENERAL:  Well appearing HEENT:  Pupils equal round and reactive, fundi not visualized, oral mucosa unremarkable NECK:  No jugular venous distention, waveform within normal limits, carotid upstroke brisk and symmetric, no bruits, no thyromegaly LYMPHATICS:  No cervical, inguinal adenopathy LUNGS:  Clear to auscultation bilaterally BACK:  No CVA tenderness CHEST:  Well healed sternotomy scar. HEART:  PMI not displaced or sustained,S1 and S2 within normal limits, no S3, no S4, no clicks, no rubs, no murmurs ABD:  Flat, positive bowel sounds normal in frequency in pitch, no bruits, no  rebound, no guarding, no midline pulsatile mass, no hepatomegaly, no splenomegaly EXT:  2 plus pulses throughout, no edema, no cyanosis no clubbing SKIN:  No rashes no nodules NEURO:  Cranial nerves II through XII grossly intact, motor grossly intact throughout PSYCH:  Cognitively intact, oriented to person place and time  ASSESSMENT AND PLAN

## 2011-05-17 NOTE — Assessment & Plan Note (Signed)
The patient has no new sypmtoms.  No further cardiovascular testing is indicated.  We will continue with aggressive risk reduction and meds as listed.  

## 2011-05-17 NOTE — Assessment & Plan Note (Signed)
The blood pressure is at target. No change in medications is indicated. We will continue with therapeutic lifestyle changes (TLC).  

## 2011-05-17 NOTE — Assessment & Plan Note (Signed)
His lipid this month included an LDL of 65 and HDL of 45.  He will continue current meds.

## 2011-05-17 NOTE — Assessment & Plan Note (Signed)
He is due to have this followed in May

## 2011-05-19 ENCOUNTER — Encounter: Payer: Self-pay | Admitting: Internal Medicine

## 2011-05-19 ENCOUNTER — Ambulatory Visit (INDEPENDENT_AMBULATORY_CARE_PROVIDER_SITE_OTHER): Payer: Medicare Other | Admitting: Internal Medicine

## 2011-05-19 DIAGNOSIS — E785 Hyperlipidemia, unspecified: Secondary | ICD-10-CM

## 2011-05-19 DIAGNOSIS — I10 Essential (primary) hypertension: Secondary | ICD-10-CM

## 2011-05-19 DIAGNOSIS — Z79899 Other long term (current) drug therapy: Secondary | ICD-10-CM

## 2011-05-19 DIAGNOSIS — I6529 Occlusion and stenosis of unspecified carotid artery: Secondary | ICD-10-CM

## 2011-05-19 DIAGNOSIS — Z833 Family history of diabetes mellitus: Secondary | ICD-10-CM

## 2011-05-19 DIAGNOSIS — R7402 Elevation of levels of lactic acid dehydrogenase (LDH): Secondary | ICD-10-CM

## 2011-05-19 DIAGNOSIS — R7401 Elevation of levels of liver transaminase levels: Secondary | ICD-10-CM

## 2011-05-19 DIAGNOSIS — R81 Glycosuria: Secondary | ICD-10-CM

## 2011-05-19 DIAGNOSIS — I2510 Atherosclerotic heart disease of native coronary artery without angina pectoris: Secondary | ICD-10-CM

## 2011-05-19 LAB — MICROALBUMIN / CREATININE URINE RATIO
Creatinine,U: 223.3 mg/dL
Microalb Creat Ratio: 0.5 mg/g (ref 0.0–30.0)
Microalb, Ur: 1.2 mg/dL (ref 0.0–1.9)

## 2011-05-19 LAB — HEMOGLOBIN A1C: Hgb A1c MFr Bld: 8.4 % — ABNORMAL HIGH (ref 4.6–6.5)

## 2011-05-19 NOTE — Progress Notes (Signed)
Subjective:    Patient ID: Todd [name], male    DOB: 1941-07-20, 70 y.o.   MRN: [id number]  HPI Diabetes status assessment because of glucose in urine @ Urology appt  Fasting or morning glucose range or average : not checked                                                  Excess thirst ;  hunger ;   urination: no , but nocturia 1-2X/ night.                                  Lightheadedness with standing:  no. Chest pain:  no ; Palpitations :no ;  Pain in  calves with walking:  no .                                                                                                                                 Non healing skin  ulcers or sores,especially over the feet:  no. Numbness or tingling or burning in feet : no .                                                                                                                                              Significant change in  Weight : stable. Vision changes : no, but dry dry eyes  .                                                                    Exercise : yard work . Nutrition/diet:  no.  Eye exam : 2 years. Foot care : no.  A1c/ urine microalbumin monitor:  Will be drawn FH: father DM in 28s; ? Bro with "touch"       Review of Systems  He is seen his cardiologist; a stress test was done last week.  Labs  05/10/2011 were reviewed. AST was mildly elevated at 40. His LDL was 65, TG 98  and HDL 45.6. He's had no glucose recorded since November 2009 when fasting blood sugar was 84.    Objective:   Physical Exam Gen.: Healthy  & well-nourished, appropriate and alert, weight Eyes: No lid/conjunctival changes, extraocular motion intact, fundi : no significant changes Neck: Normal range of motion, thyroid normal. Op scar L neck Respiratory: No increased work of breathing or abnormal breath sounds Cardiac : regular rhythm, no extra heart sounds, gallop. Very faint R base  murmur Abdomen: No organomegaly ,masses, bruits or aortic  enlargement. Ventral hernia Lymph: No lymphadenopathy of the neck or axilla Skin: No rashes, lesions, ulcers or ischemic changes Muscle skeletal: no nail changes; joints: DIP OA hand changes Vasc:All pulses intact. ? Faint R carotid  bruit present. 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  Plan: See lab orders and recommendations

## 2011-05-19 NOTE — Patient Instructions (Signed)
Eat a low-fat diet with lots of fruits and vegetables, up to 7-9 servings per day. Avoid obesity; your goal is waist measurement < 40 inches.Consume less than 40 grams of sugar per day from foods & drinks with High Fructose Corn Sugar as #1,2,3 or # 4 on label. Follow the low carb nutrition program in [employer] as closely as possible to prevent Diabetes progression & complications. White carbohydrates (potatoes, rice, bread, and pasta) have a high spike of sugar and a high load of sugar. For example a  baked potato has a cup of sugar and a  french fry  2 teaspoons of sugar. Yams, wild  rice, whole grained bread &  wheat pasta have been much lower spike and load of  sugar. Portions should be the size of a deck of cards or your palm.  Exercise at least 30-45 minutes a day,  3-4 days a week. Mild elevation of liver enzyme test; avoid excess Tylenol, alcohol & vitamin A.Recheck in 3-4 months.

## 2011-05-22 ENCOUNTER — Telehealth: Payer: Self-pay

## 2011-05-22 MED ORDER — GLIMEPIRIDE 1 MG PO TABS: 1.0000 mg | ORAL_TABLET | Freq: Two times a day (BID) | ORAL | Status: DC

## 2011-05-22 MED ORDER — METFORMIN HCL ER 500 MG PO TB24: 500.0000 mg | ORAL_TABLET | Freq: Two times a day (BID) | ORAL | Status: DC

## 2011-05-22 NOTE — Telephone Encounter (Signed)
Message copied by Edgardo Roys on Mon May 22, 2011  5:13 PM ------      Message from: Pecola Lawless      Created: Sun May 21, 2011  8:36 AM       Diabetes Monitor:       The A1c test is used primarily to monitor the glucose control of diabetics over time. The goal of those with diabetes is to keep their blood glucose levels as close to normal as possible. This helps to minimize the complications caused by chronically elevated glucose levels, such as progressive damage to body organs like the kidneys, eyes, cardiovascular system, and nerves. The A1c test gives a picture of the average amount of glucose in the blood over the last few months. It can help a patient and his doctor know if the measures they are taking to control the patient's diabetes are successful or need to be adjusted.        NORMAL VALUES       Non diabetic adults: 5 %-6.1%       Good diabetic control: 6.2-6.4 %       Fair diabetic control: 6.5-7%       Poor diabetic control: greater than 7 % ( except with additional factors such as  advanced age; significant coronary or neurologic disease,etc). Check the A1c every 6 months if it is < 6.5%; every 4 months if  6.5% or higher. Goals for home glucose monitoring are : fasting  or morning glucose goal of  90-150. Two hours after any meal , goal = < 180, preferably < 150.      Please fill new Rxs & repeat A1c in 6 weeks (250.02). Please bring these instructions to that Lab appt. See me 2-3 days later. [employer]

## 2011-05-22 NOTE — Telephone Encounter (Signed)
RX's and labs mailed to patient

## 2011-05-25 ENCOUNTER — Telehealth: Payer: Self-pay

## 2011-05-25 MED ORDER — FREESTYLE LANCETS MISC: Status: DC

## 2011-05-25 MED ORDER — GLUCOSE BLOOD VI STRP: ORAL_STRIP | Status: DC

## 2011-05-25 NOTE — Telephone Encounter (Signed)
Message left on voicemail: Patient would like  test strips for a blood sugar machine mailed, patient checked with insurance company and they will cover DM supplies.  I called patient and informed him we have machines here and I will place one at the front desk for pick-up along with a rx for test strips. Patient ok'd   RX's and machine placed at front for pick-up

## 2011-06-30 ENCOUNTER — Other Ambulatory Visit: Payer: Self-pay | Admitting: Internal Medicine

## 2011-07-03 ENCOUNTER — Other Ambulatory Visit (INDEPENDENT_AMBULATORY_CARE_PROVIDER_SITE_OTHER): Payer: Medicare Other

## 2011-07-03 DIAGNOSIS — Z79899 Other long term (current) drug therapy: Secondary | ICD-10-CM

## 2011-07-03 DIAGNOSIS — R945 Abnormal results of liver function studies: Secondary | ICD-10-CM

## 2011-07-03 NOTE — Progress Notes (Signed)
12  

## 2011-07-04 LAB — HEMOGLOBIN A1C: Hgb A1c MFr Bld: 7 % — ABNORMAL HIGH (ref 4.6–6.5)

## 2011-07-06 ENCOUNTER — Ambulatory Visit (INDEPENDENT_AMBULATORY_CARE_PROVIDER_SITE_OTHER): Payer: Medicare Other | Admitting: Internal Medicine

## 2011-07-06 DIAGNOSIS — E119 Type 2 diabetes mellitus without complications: Secondary | ICD-10-CM

## 2011-07-06 DIAGNOSIS — Z23 Encounter for immunization: Secondary | ICD-10-CM

## 2011-07-06 MED ORDER — METFORMIN HCL ER 500 MG PO TB24: 500.0000 mg | ORAL_TABLET | Freq: Two times a day (BID) | ORAL | Status: DC

## 2011-07-06 NOTE — Progress Notes (Signed)
Subjective:    Patient ID: Todd [name], male    DOB: 06-01-1941, 70 y.o.   MRN: [id number]  HPI Diabetes status assessment: Fasting or morning glucose range:  FBS 75- 121  Highest glucose 2 hours after any meal:  < 170. Hypoglycemia :  Yes about 12 noon , had not eaten.                                                     Excess thirst; hunger; urination:  no.                                  Lightheadedness with standing:  no. Chest pain:  no ; Palpitations :no ;  Pain in  calves with walking:  no .                                                                                                                                 Non healing skin  ulcers or sores,especially over the feet:  no. Numbness or tingling or burning in feet : no .                                                                                                                                              Significant change in  Weight : no but lost inches. Vision changes : no  .                                                                    Exercise : daily . Nutrition/diet:  No sweets. Medication compliance : yes. Eye exam : 2 weeks ago. Foot care : no.  A1c/ urine microalbumin monitor:  A1c now 7% ( prev 8.4%)       Review of Systems     Objective:   Physical Exam he appears healthy  and well-nourished  There is a faint right carotid bruit   He has a regular rhythm with an S4 with slight slurring.  Chest is clear to auscultation.  All pulses are intact.  Light touch is normal bursae.  Light touch of the feet is normal. Nails appear healthy. The hair growth is noted over the feet.        Assessment & Plan:  #1 diabetes , significant improvement. Rare hypoglycemia with decreased by mouth intake.  Plan: See orders and recommendations

## 2011-07-06 NOTE — Patient Instructions (Signed)
Eat a low-fat diet with lots of fruits and vegetables, up to 7-9 servings per day. Avoid obesity; your goal is waist measurement < 40 inches.Consume less than 40 grams of sugar per day from foods & drinks with High Fructose Corn Sugar as #1,2,3 or # 4 on label. Follow the low carb nutrition program in [employer] as closely as possible to prevent Diabetes progression & complications. White carbohydrates (potatoes, rice, bread, and pasta) have a high spike of sugar and a high load of sugar. For example a  baked potato has a cup of sugar and a  french fry  2 teaspoons of sugar. Yams, wild  rice, whole grained bread &  wheat pasta have been much lower spike and load of  sugar. Portions should be the size of a deck of cards or your palm.  Blood Pressure Goal  Ideally is an AVERAGE < 135/85. This AVERAGE should be calculated from @ least 5-7 BP readings taken @ different times of day on different days of week. You should not respond to isolated BP readings , but rather the AVERAGE for that week  Please  schedule fasting Labs in 3 months : BMET,A1c , urine microalbumin.  Please bring these instructions to that Lab appt.

## 2011-09-07 ENCOUNTER — Telehealth: Payer: Self-pay | Admitting: Cardiology

## 2011-09-07 DIAGNOSIS — I6529 Occlusion and stenosis of unspecified carotid artery: Secondary | ICD-10-CM

## 2011-09-07 DIAGNOSIS — I2510 Atherosclerotic heart disease of native coronary artery without angina pectoris: Secondary | ICD-10-CM

## 2011-09-07 DIAGNOSIS — E785 Hyperlipidemia, unspecified: Secondary | ICD-10-CM

## 2011-09-07 DIAGNOSIS — I10 Essential (primary) hypertension: Secondary | ICD-10-CM

## 2011-09-07 MED ORDER — NIACIN ER (ANTIHYPERLIPIDEMIC) 1000 MG PO TBCR: 1000.0000 mg | EXTENDED_RELEASE_TABLET | Freq: Every day | ORAL | Status: DC

## 2011-09-07 MED ORDER — OMEPRAZOLE 20 MG PO CPDR: 20.0000 mg | DELAYED_RELEASE_CAPSULE | Freq: Every day | ORAL | Status: DC

## 2011-09-07 MED ORDER — SIMVASTATIN 40 MG PO TABS: 40.0000 mg | ORAL_TABLET | Freq: Every day | ORAL | Status: DC

## 2011-09-07 MED ORDER — NITROGLYCERIN 0.4 MG SL SUBL: 0.4000 mg | SUBLINGUAL_TABLET | SUBLINGUAL | Status: DC | PRN

## 2011-09-07 MED ORDER — METOPROLOL TARTRATE 50 MG PO TABS: 50.0000 mg | ORAL_TABLET | Freq: Two times a day (BID) | ORAL | Status: DC

## 2011-09-07 NOTE — Telephone Encounter (Signed)
Pt needs all meds we prescribed, changed to a new pharmacy, [facility]

## 2011-09-07 NOTE — Telephone Encounter (Signed)
rx sent into pharmacy. Pt wife notified.

## 2011-10-05 ENCOUNTER — Other Ambulatory Visit: Payer: Self-pay | Admitting: Internal Medicine

## 2011-10-05 DIAGNOSIS — E119 Type 2 diabetes mellitus without complications: Secondary | ICD-10-CM

## 2011-10-06 ENCOUNTER — Other Ambulatory Visit (INDEPENDENT_AMBULATORY_CARE_PROVIDER_SITE_OTHER): Payer: Medicare Other

## 2011-10-06 DIAGNOSIS — E119 Type 2 diabetes mellitus without complications: Secondary | ICD-10-CM

## 2011-10-06 LAB — BASIC METABOLIC PANEL
BUN: 16 mg/dL (ref 6–23)
CO2: 28 mEq/L (ref 19–32)
Calcium: 9.2 mg/dL (ref 8.4–10.5)
Chloride: 106 mEq/L (ref 96–112)
Creatinine, Ser: 0.8 mg/dL (ref 0.4–1.5)
GFR: 108.19 mL/min (ref >60.00)
Glucose, Bld: 129 mg/dL — ABNORMAL HIGH (ref 70–99)
Potassium: 4.2 mEq/L (ref 3.5–5.1)
Sodium: 141 mEq/L (ref 135–145)

## 2011-10-06 LAB — MICROALBUMIN / CREATININE URINE RATIO
Creatinine,U: 196.7 mg/dL
Microalb Creat Ratio: 0.5 mg/g (ref 0.0–30.0)
Microalb, Ur: 0.9 mg/dL (ref 0.0–1.9)

## 2011-10-06 LAB — HEMOGLOBIN A1C: Hgb A1c MFr Bld: 6.8 % — ABNORMAL HIGH (ref 4.6–6.5)

## 2011-10-06 NOTE — Progress Notes (Signed)
Labs only

## 2011-10-10 ENCOUNTER — Other Ambulatory Visit: Payer: Medicare Other

## 2011-10-17 ENCOUNTER — Other Ambulatory Visit: Payer: Self-pay

## 2011-10-17 MED ORDER — METFORMIN HCL ER 500 MG PO TB24: 500.0000 mg | ORAL_TABLET | Freq: Two times a day (BID) | ORAL | Status: DC

## 2011-10-17 NOTE — Telephone Encounter (Signed)
Patient's spouse requesting a 30 day supply of medication to be filled locally as they may run out before mail order prescription gets there.

## 2011-10-17 NOTE — Telephone Encounter (Signed)
Patient's spouse called requesting a new prescription be sent to the new mail order pharmacy, [facility].  They may run out before they get the prescription.  So, she asked for a 30 day local prescription as well to get them by until they get the one in the mail.

## 2011-11-02 ENCOUNTER — Telehealth: Payer: Self-pay | Admitting: Cardiology

## 2011-11-02 NOTE — Telephone Encounter (Signed)
Pt due for 1 year follow up carotid doppler in May. I spoke with wife and gave her this information and scheduled appt for Dec 25, 2011 at 8:00

## 2011-11-02 NOTE — Telephone Encounter (Signed)
New Msg: Pt calling wanting to speak with nurse/MD to find out if pt needs to have carotid in April. Please return pt wife call to discuss further.

## 2011-11-06 ENCOUNTER — Other Ambulatory Visit: Payer: Self-pay | Admitting: *Deleted

## 2011-11-06 MED ORDER — METFORMIN HCL ER 500 MG PO TB24: 500.0000 mg | ORAL_TABLET | Freq: Two times a day (BID) | ORAL | Status: DC

## 2011-11-06 NOTE — Telephone Encounter (Signed)
Rx sent 

## 2011-11-27 ENCOUNTER — Encounter: Payer: Self-pay | Admitting: Family Medicine

## 2011-11-27 ENCOUNTER — Telehealth: Payer: Self-pay | Admitting: Internal Medicine

## 2011-11-27 ENCOUNTER — Ambulatory Visit (INDEPENDENT_AMBULATORY_CARE_PROVIDER_SITE_OTHER): Payer: Medicare Other | Admitting: Family Medicine

## 2011-11-27 VITALS — BP 118/79 | HR 86 | Temp 98.3°F | Ht 68.0 in | Wt 218.2 lb

## 2011-11-27 DIAGNOSIS — J019 Acute sinusitis, unspecified: Secondary | ICD-10-CM | POA: Insufficient documentation

## 2011-11-27 MED ORDER — AMOXICILLIN 875 MG PO TABS: 875.0000 mg | ORAL_TABLET | Freq: Two times a day (BID) | ORAL | Status: AC

## 2011-11-27 MED ORDER — GUAIFENESIN-CODEINE 100-10 MG/5ML PO SYRP: 10.0000 mL | ORAL_SOLUTION | Freq: Three times a day (TID) | ORAL | Status: AC | PRN

## 2011-11-27 NOTE — Telephone Encounter (Signed)
I recommend [facility] if he is having shortness of breath . If only chest congestion without respiratory compromise appointment 4/9 OK

## 2011-11-27 NOTE — Telephone Encounter (Signed)
No availability today due to office meeting, however there is a opening on tomorrow morning if ok to wait. Marland KitchenPlease advise

## 2011-11-27 NOTE — Telephone Encounter (Signed)
12:30 today appears to be open; I think we had an office meeting which was canceled. Please check

## 2011-11-27 NOTE — Patient Instructions (Signed)
This is a sinus infection Start the Amoxicillin twice a day- w/ food Drink plenty of fluids Use the cough syrup as needed Drink plenty of fluids REST! Hang in there!!

## 2011-11-27 NOTE — Telephone Encounter (Signed)
ts wife gets on the phone with the Rn and denies that the pt is SOB. He started with congestion on Thursday 11/23/11. She is calling to get an appt for congestion/cough.She states there are no emergent sx. She only wants an appt today with Dr. Alwyn Ren. Dr. Caryl Never schedule is booked/transferred to scheduler to see if they could fit the pt into that schedule. RN had offered appt with Dr. Harden Mo refused. Due to multiple health issues they only see Dr. Alwyn Ren. RN did not complete triage/pts spouse didn't want that/she wanted an appt.

## 2011-11-27 NOTE — Assessment & Plan Note (Signed)
New.  Pt's sxs and PE consistent w/ infxn.  Start abx.  Reviewed supportive care and red flags that should prompt return.  Pt expressed understanding and is in agreement w/ plan.  

## 2011-11-27 NOTE — Telephone Encounter (Signed)
Pt was seen in office today by another provider.

## 2011-11-27 NOTE — Progress Notes (Signed)
  Subjective:    Patient ID: Todd [name], male    DOB: 1940/10/14, 71 y.o.   MRN: [id number]  HPI Sinusitis- + PND, 'unable to sleep at night'.  sxs started early last week and then worsened on Thursday.  Initially thought it was allergy related.  + facial pain and pressure.  + cough- dry.  + ear pain, L>R.  No fevers.   Review of Systems For ROS see HPI     Objective:   Physical Exam  Vitals reviewed. Constitutional: He appears well-developed and well-nourished. No distress.  HENT:  Head: Normocephalic and atraumatic.  Right Ear: Tympanic membrane normal.  Left Ear: Tympanic membrane normal.  Nose: Mucosal edema and rhinorrhea present. Right sinus exhibits maxillary sinus tenderness and frontal sinus tenderness. Left sinus exhibits maxillary sinus tenderness and frontal sinus tenderness.  Mouth/Throat: Mucous membranes are normal. Oropharyngeal exudate and posterior oropharyngeal erythema present. No posterior oropharyngeal edema.       + PND  Eyes: Conjunctivae and EOM are normal. Pupils are equal, round, and reactive to light.  Neck: Normal range of motion. Neck supple.  Cardiovascular: Normal rate, regular rhythm and normal heart sounds.   Pulmonary/Chest: Effort normal and breath sounds normal. No respiratory distress. He has no wheezes.       + hacking cough  Lymphadenopathy:    He has no cervical adenopathy.  Skin: Skin is warm and dry.          Assessment & Plan:

## 2011-12-21 ENCOUNTER — Other Ambulatory Visit: Payer: Self-pay | Admitting: Cardiology

## 2011-12-21 DIAGNOSIS — I6529 Occlusion and stenosis of unspecified carotid artery: Secondary | ICD-10-CM

## 2011-12-25 ENCOUNTER — Encounter (INDEPENDENT_AMBULATORY_CARE_PROVIDER_SITE_OTHER): Payer: Medicare Other

## 2011-12-25 DIAGNOSIS — I6529 Occlusion and stenosis of unspecified carotid artery: Secondary | ICD-10-CM

## 2012-02-05 ENCOUNTER — Other Ambulatory Visit (INDEPENDENT_AMBULATORY_CARE_PROVIDER_SITE_OTHER): Payer: Medicare Other

## 2012-02-05 DIAGNOSIS — Z79899 Other long term (current) drug therapy: Secondary | ICD-10-CM

## 2012-02-05 DIAGNOSIS — E785 Hyperlipidemia, unspecified: Secondary | ICD-10-CM

## 2012-02-05 LAB — HEMOGLOBIN A1C: Hgb A1c MFr Bld: 6.7 % — ABNORMAL HIGH (ref 4.6–6.5)

## 2012-02-05 NOTE — Progress Notes (Signed)
[facility] [employer]

## 2012-03-09 ENCOUNTER — Other Ambulatory Visit: Payer: Self-pay | Admitting: Internal Medicine

## 2012-04-18 ENCOUNTER — Telehealth: Payer: Self-pay | Admitting: Cardiology

## 2012-04-18 DIAGNOSIS — E7800 Pure hypercholesterolemia, unspecified: Secondary | ICD-10-CM

## 2012-04-18 NOTE — Telephone Encounter (Signed)
Pt scheduled for fasting lab, carotid doppler and follow up appt with Dr Antoine Poche.

## 2012-04-18 NOTE — Telephone Encounter (Signed)
New Problem:    Patient called in wanting to know if her husband was due for another Carotid and if he needed to fast for his next appointment to see Dr. Antoine Poche.  Please call back.

## 2012-04-18 NOTE — Telephone Encounter (Signed)
Carotid not due until 11/13 Pt is due for a fasting lipid panel if he has not had one somewhere else recently.

## 2012-05-15 ENCOUNTER — Ambulatory Visit (INDEPENDENT_AMBULATORY_CARE_PROVIDER_SITE_OTHER): Payer: Medicare Other

## 2012-05-15 DIAGNOSIS — Z23 Encounter for immunization: Secondary | ICD-10-CM

## 2012-06-06 ENCOUNTER — Other Ambulatory Visit (INDEPENDENT_AMBULATORY_CARE_PROVIDER_SITE_OTHER): Payer: Medicare Other

## 2012-06-06 ENCOUNTER — Encounter: Payer: Self-pay | Admitting: *Deleted

## 2012-06-06 DIAGNOSIS — E119 Type 2 diabetes mellitus without complications: Secondary | ICD-10-CM

## 2012-06-06 LAB — HEMOGLOBIN A1C: Hgb A1c MFr Bld: 6.6 % — ABNORMAL HIGH (ref 4.6–6.5)

## 2012-06-17 ENCOUNTER — Other Ambulatory Visit: Payer: Self-pay | Admitting: Internal Medicine

## 2012-06-17 NOTE — Telephone Encounter (Signed)
Rx sent.    MW 

## 2012-06-20 ENCOUNTER — Other Ambulatory Visit: Payer: Self-pay | Admitting: *Deleted

## 2012-06-20 DIAGNOSIS — I6529 Occlusion and stenosis of unspecified carotid artery: Secondary | ICD-10-CM

## 2012-06-21 ENCOUNTER — Other Ambulatory Visit: Payer: Self-pay | Admitting: *Deleted

## 2012-06-21 DIAGNOSIS — I10 Essential (primary) hypertension: Secondary | ICD-10-CM

## 2012-06-21 DIAGNOSIS — E785 Hyperlipidemia, unspecified: Secondary | ICD-10-CM

## 2012-06-21 DIAGNOSIS — I2510 Atherosclerotic heart disease of native coronary artery without angina pectoris: Secondary | ICD-10-CM

## 2012-06-27 ENCOUNTER — Encounter: Payer: Self-pay | Admitting: Cardiology

## 2012-06-27 ENCOUNTER — Other Ambulatory Visit (INDEPENDENT_AMBULATORY_CARE_PROVIDER_SITE_OTHER): Payer: Medicare Other

## 2012-06-27 ENCOUNTER — Encounter (INDEPENDENT_AMBULATORY_CARE_PROVIDER_SITE_OTHER): Payer: Medicare Other

## 2012-06-27 ENCOUNTER — Ambulatory Visit (INDEPENDENT_AMBULATORY_CARE_PROVIDER_SITE_OTHER): Payer: Medicare Other | Admitting: Cardiology

## 2012-06-27 VITALS — BP 138/84 | HR 66 | Ht 68.0 in | Wt 213.1 lb

## 2012-06-27 DIAGNOSIS — I2510 Atherosclerotic heart disease of native coronary artery without angina pectoris: Secondary | ICD-10-CM

## 2012-06-27 DIAGNOSIS — E785 Hyperlipidemia, unspecified: Secondary | ICD-10-CM

## 2012-06-27 DIAGNOSIS — I6529 Occlusion and stenosis of unspecified carotid artery: Secondary | ICD-10-CM

## 2012-06-27 DIAGNOSIS — I10 Essential (primary) hypertension: Secondary | ICD-10-CM

## 2012-06-27 LAB — HEPATIC FUNCTION PANEL
ALT: 27 U/L (ref 0–53)
AST: 29 U/L (ref 0–37)
Albumin: 3.6 g/dL (ref 3.5–5.2)
Alkaline Phosphatase: 59 U/L (ref 39–117)
Bilirubin, Direct: 0.1 mg/dL (ref 0.0–0.3)
Total Bilirubin: 0.4 mg/dL (ref 0.3–1.2)
Total Protein: 6.9 g/dL (ref 6.0–8.3)

## 2012-06-27 LAB — LIPID PANEL
Cholesterol: 105 mg/dL (ref 0–200)
HDL: 30.7 mg/dL — ABNORMAL LOW (ref >39.00)
LDL Cholesterol: 56 mg/dL (ref 0–99)
Total CHOL/HDL Ratio: 3
Triglycerides: 93 mg/dL (ref 0.0–149.0)
VLDL: 18.6 mg/dL (ref 0.0–40.0)

## 2012-06-27 LAB — BASIC METABOLIC PANEL
BUN: 18 mg/dL (ref 6–23)
CO2: 26 mEq/L (ref 19–32)
Calcium: 9.1 mg/dL (ref 8.4–10.5)
Chloride: 108 mEq/L (ref 96–112)
Creatinine, Ser: 0.8 mg/dL (ref 0.4–1.5)
GFR: 101.75 mL/min (ref >60.00)
Glucose, Bld: 99 mg/dL (ref 70–99)
Potassium: 4.4 mEq/L (ref 3.5–5.1)
Sodium: 141 mEq/L (ref 135–145)

## 2012-06-27 NOTE — Patient Instructions (Addendum)
The current medical regimen is effective;  continue present plan and medications.  Follow up in 1 year with Dr Hochrein.  You will receive a letter in the mail 2 months before you are due.  Please call us when you receive this letter to schedule your follow up appointment.  

## 2012-06-27 NOTE — Progress Notes (Signed)
HPI The patient presents for follow up of CAD.  Last year he had some increased dyspnea but had a negative stress perfusion study. Since then he has lost 20 pounds. He says he is breathing better. He denies any new symptoms.  The patient denies any new symptoms such as chest discomfort, neck or arm discomfort. There has been no new shortness of breath, PND or orthopnea. There have been no reported palpitations, presyncope or syncope.  He is not exercising routinely but is active around the house.     Allergies  Allergen Reactions  . Amlodipine Besylate     rash  . Ivp Dye (Iodinated Diagnostic Agents)     rash    Current Outpatient Prescriptions  Medication Sig Dispense Refill  . aspirin 81 MG tablet Take 81 mg by mouth daily.        . cetirizine (ZYRTEC) 10 MG tablet Take 10 mg by mouth as needed.        . Cholecalciferol (VITAMIN D-3) 5000 UNITS TABS Take by mouth daily.        . fish oil-omega-3 fatty acids 1000 MG capsule Take 2 g by mouth daily.        Marland Kitchen FREESTYLE LITE test strip USE TO CHECK BLODD GLUCOSE DAILY AS DIRECTED  100 each  3  . Lancets (FREESTYLE) lancets USE TO CHECK BLOOD GLUCOSE DAILY AS DIRECTED  100 each  3  . metFORMIN (GLUCOPHAGE-XR) 500 MG 24 hr tablet Take 1 tablet (500 mg total) by mouth 2 (two) times daily. With breakfast and eve meal  180 tablet  0  . metoprolol (LOPRESSOR) 50 MG tablet Take 1 tablet (50 mg total) by mouth 2 (two) times daily.  180 tablet  3  . Multiple Vitamin (MULTIVITAMIN) tablet Take 1 tablet by mouth daily.        . niacin (NIASPAN) 1000 MG CR tablet Take 1 tablet (1,000 mg total) by mouth at bedtime.  90 tablet  3  . nitroGLYCERIN (NITROSTAT) 0.4 MG SL tablet Place 1 tablet (0.4 mg total) under the tongue every 5 (five) minutes as needed for chest pain.  25 tablet  6  . omeprazole (PRILOSEC) 20 MG capsule Take 1 capsule (20 mg total) by mouth daily.  90 capsule  3  . polycarbophil (FIBERCON) 625 MG tablet Take 625 mg by mouth daily.         . simvastatin (ZOCOR) 40 MG tablet Take 1 tablet (40 mg total) by mouth at bedtime.  90 tablet  3    Past Medical History  Diagnosis Date  . Elevated alkaline phosphatase level   . HLD (hyperlipidemia)   . HTN (hypertension)   . Allergic rhinitis   . Nephrolithiasis   . CAD (coronary artery disease)     Past Surgical History  Procedure Date  . Colonoscopy w/ polypectomy 2009  . Coronary artery bypass graft 1999  . Carotid endarterectomy 1999    ROS:  As stated in the HPI and negative for all other systems.  PHYSICAL EXAM BP 138/84  Pulse 66  Ht '5\' 8"'$  (1.727 m)  Wt 213 lb 1.9 oz (96.671 kg)  BMI 32.40 kg/m2 GENERAL:  Well appearing HEENT:  Pupils equal round and reactive, fundi not visualized, oral mucosa unremarkable NECK:  No jugular venous distention, waveform within normal limits, carotid upstroke brisk and symmetric, no bruits, no thyromegaly LUNGS:  Clear to auscultation bilaterally CHEST:  Well healed sternotomy scar. HEART:  PMI not displaced or sustained,S1  and S2 within normal limits, no S3, no S4, no clicks, no rubs, no murmurs ABD:  Flat, positive bowel sounds normal in frequency in pitch, no bruits, no rebound, no guarding, no midline pulsatile mass, no hepatomegaly, no splenomegaly EXT:  2 plus pulses throughout, no edema, no cyanosis no clubbing  ASSESSMENT AND PLAN  C A D -  The patient has no new sypmtoms since stress testing last year.  No further cardiovascular testing is indicated. We will continue with aggressive risk reduction and meds as listed.   CAROTID ARTERY DISEASE -  He had follow up today with stable plaque and will be followed again in 6 months.  HYPERLIPIDEMIA -  I will follow the lipids and treat with a goal LDL less than 70.  OVERWEIGHT/OBESITY -  I am very proud of his weight loss and I encourage more of the same.  HYPERTENSION - The blood pressure is at target. No change in medications is indicated. We will continue with  therapeutic lifestyle changes (TLC).;

## 2012-07-30 ENCOUNTER — Other Ambulatory Visit: Payer: Self-pay | Admitting: *Deleted

## 2012-07-30 DIAGNOSIS — E785 Hyperlipidemia, unspecified: Secondary | ICD-10-CM

## 2012-07-30 MED ORDER — SIMVASTATIN 40 MG PO TABS: 40.0000 mg | ORAL_TABLET | Freq: Every day | ORAL | Status: DC

## 2012-07-31 ENCOUNTER — Other Ambulatory Visit: Payer: Self-pay

## 2012-07-31 DIAGNOSIS — E785 Hyperlipidemia, unspecified: Secondary | ICD-10-CM

## 2012-07-31 DIAGNOSIS — I10 Essential (primary) hypertension: Secondary | ICD-10-CM

## 2012-07-31 DIAGNOSIS — I2510 Atherosclerotic heart disease of native coronary artery without angina pectoris: Secondary | ICD-10-CM

## 2012-07-31 DIAGNOSIS — I6529 Occlusion and stenosis of unspecified carotid artery: Secondary | ICD-10-CM

## 2012-07-31 MED ORDER — METOPROLOL TARTRATE 50 MG PO TABS: 50.0000 mg | ORAL_TABLET | Freq: Two times a day (BID) | ORAL | Status: DC

## 2012-07-31 MED ORDER — SIMVASTATIN 40 MG PO TABS: 40.0000 mg | ORAL_TABLET | Freq: Every day | ORAL | Status: DC

## 2012-07-31 MED ORDER — OMEPRAZOLE 20 MG PO CPDR: 20.0000 mg | DELAYED_RELEASE_CAPSULE | Freq: Every day | ORAL | Status: DC

## 2012-07-31 NOTE — Telephone Encounter (Signed)
..   Requested Prescriptions   Signed Prescriptions Disp Refills  . simvastatin (ZOCOR) 40 MG tablet 90 tablet 3    Sig: Take 1 tablet (40 mg total) by mouth at bedtime.    Authorizing Provider: Rollene Rotunda    Ordering User: Taj Arteaga M  . omeprazole (PRILOSEC) 20 MG capsule 90 capsule 3    Sig: Take 1 capsule (20 mg total) by mouth daily.    Authorizing Provider: Rollene Rotunda    Ordering User: Dimitriy Carreras M  . metoprolol (LOPRESSOR) 50 MG tablet 180 tablet 3    Sig: Take 1 tablet (50 mg total) by mouth 2 (two) times daily.    Authorizing Provider: Rollene Rotunda    Ordering User: Christella Hartigan, Melquan Ernsberger Judie Petit

## 2012-08-21 HISTORY — PX: COLONOSCOPY: SHX174

## 2012-09-13 ENCOUNTER — Encounter: Payer: Self-pay | Admitting: Gastroenterology

## 2012-09-18 ENCOUNTER — Encounter: Payer: Self-pay | Admitting: Gastroenterology

## 2012-09-18 ENCOUNTER — Telehealth: Payer: Self-pay | Admitting: Internal Medicine

## 2012-09-18 NOTE — Telephone Encounter (Signed)
This patient has not seen Dr.Hopper greater than 12 months, patient needs to schedule CPX with fasting labs. A1c is due in February 2014 or patient can have done with CPX that he is due for.

## 2012-09-18 NOTE — Telephone Encounter (Signed)
Patient's wife called to find out when patient will be due for next a1c check. CB# [phone]

## 2012-09-23 NOTE — Telephone Encounter (Signed)
CPE scheduled for 12/03/12. Pt will have a1c checked at that time.

## 2012-10-28 ENCOUNTER — Ambulatory Visit (AMBULATORY_SURGERY_CENTER): Payer: Medicare Other | Admitting: *Deleted

## 2012-10-28 ENCOUNTER — Encounter: Payer: Self-pay | Admitting: Gastroenterology

## 2012-10-28 VITALS — Ht 68.5 in | Wt 220.0 lb

## 2012-10-28 DIAGNOSIS — Z800 Family history of malignant neoplasm of digestive organs: Secondary | ICD-10-CM

## 2012-10-28 DIAGNOSIS — Z1211 Encounter for screening for malignant neoplasm of colon: Secondary | ICD-10-CM

## 2012-10-28 MED ORDER — MOVIPREP 100 G PO SOLR: ORAL | Status: DC

## 2012-11-04 ENCOUNTER — Telehealth: Payer: Self-pay | Admitting: Internal Medicine

## 2012-11-04 MED ORDER — METFORMIN HCL ER 500 MG PO TB24: ORAL_TABLET | ORAL | Status: DC

## 2012-11-04 NOTE — Telephone Encounter (Signed)
RX sent electronically 

## 2012-11-04 NOTE — Telephone Encounter (Signed)
Refill: Metformin er tab 500 mg. Take 1 tablet by mouth 2 times daily. 90 day supply

## 2012-11-07 ENCOUNTER — Other Ambulatory Visit: Payer: Self-pay

## 2012-11-07 MED ORDER — NIACIN ER (ANTIHYPERLIPIDEMIC) 1000 MG PO TBCR: 1000.0000 mg | EXTENDED_RELEASE_TABLET | Freq: Every day | ORAL | Status: DC

## 2012-11-07 NOTE — Telephone Encounter (Signed)
..   Requested Prescriptions   Signed Prescriptions Disp Refills  . niacin (NIASPAN) 1000 MG CR tablet 90 tablet 3    Sig: Take 1 tablet (1,000 mg total) by mouth at bedtime.    Authorizing Provider: Rollene Rotunda    Ordering User: Christella Hartigan, Skyanne Welle Judie Petit

## 2012-11-11 ENCOUNTER — Ambulatory Visit (AMBULATORY_SURGERY_CENTER): Payer: Medicare Other | Admitting: Gastroenterology

## 2012-11-11 ENCOUNTER — Encounter: Payer: Self-pay | Admitting: Gastroenterology

## 2012-11-11 ENCOUNTER — Other Ambulatory Visit: Payer: Self-pay | Admitting: Gastroenterology

## 2012-11-11 VITALS — BP 107/73 | HR 59 | Temp 97.0°F | Resp 16 | Ht 68.0 in | Wt 220.0 lb

## 2012-11-11 DIAGNOSIS — K5730 Diverticulosis of large intestine without perforation or abscess without bleeding: Secondary | ICD-10-CM

## 2012-11-11 DIAGNOSIS — Z1211 Encounter for screening for malignant neoplasm of colon: Secondary | ICD-10-CM

## 2012-11-11 DIAGNOSIS — Z800 Family history of malignant neoplasm of digestive organs: Secondary | ICD-10-CM

## 2012-11-11 LAB — GLUCOSE, CAPILLARY
Glucose-Capillary: 127 mg/dL — ABNORMAL HIGH (ref 70–99)
Glucose-Capillary: 112 mg/dL — ABNORMAL HIGH (ref 70–99)

## 2012-11-11 MED ORDER — SODIUM CHLORIDE 0.9 % IV SOLN: 500.0000 mL | INTRAVENOUS | Status: DC

## 2012-11-11 NOTE — Patient Instructions (Addendum)
YOU HAD AN ENDOSCOPIC PROCEDURE TODAY AT THE [facility]: Refer to the procedure report that was given to you for any specific questions about what was found during the examination.  If the procedure report does not answer your questions, please call your gastroenterologist to clarify.  If you requested that your care partner not be given the details of your procedure findings, then the procedure report has been included in a sealed envelope for you to review at your convenience later.  YOU SHOULD EXPECT: Some feelings of bloating in the abdomen. Passage of more gas than usual.  Walking can help get rid of the air that was put into your GI tract during the procedure and reduce the bloating. If you had a lower endoscopy (such as a colonoscopy or flexible sigmoidoscopy) you may notice spotting of blood in your stool or on the toilet paper. If you underwent a bowel prep for your procedure, then you may not have a normal bowel movement for a few days.  DIET: Your first meal following the procedure should be a light meal and then it is ok to progress to your normal diet.  A half-sandwich or bowl of soup is an example of a good first meal.  Heavy or fried foods are harder to digest and may make you feel nauseous or bloated.  Likewise meals heavy in dairy and vegetables can cause extra gas to form and this can also increase the bloating.  Drink plenty of fluids but you should avoid alcoholic beverages for 24 hours.  ACTIVITY: Your care partner should take you home directly after the procedure.  You should plan to take it easy, moving slowly for the rest of the day.  You can resume normal activity the day after the procedure however you should NOT DRIVE or use heavy machinery for 24 hours (because of the sedation medicines used during the test).    SYMPTOMS TO REPORT IMMEDIATELY: A gastroenterologist can be reached at any hour.  During normal business hours, 8:30 AM to 5:00 PM Monday through Friday,  call [phone].  After hours and on weekends, please call the GI answering service at [phone] who will take a message and have the physician on call contact you.   Following lower endoscopy (colonoscopy or flexible sigmoidoscopy):  Excessive amounts of blood in the stool  Significant tenderness or worsening of abdominal pains  Swelling of the abdomen that is new, acute  Fever of 100F or higher  FOLLOW UP: If any biopsies were taken you will be contacted by phone or by letter within the next 1-3 weeks.  Call your gastroenterologist if you have not heard about the biopsies in 3 weeks.  Our staff will call the home number listed on your records the next business day following your procedure to check on you and address any questions or concerns that you may have at that time regarding the information given to you following your procedure. This is a courtesy call and so if there is no answer at the home number and we have not heard from you through the emergency physician on call, we will assume that you have returned to your regular daily activities without incident.  SIGNATURES/CONFIDENTIALITY: You and/or your care partner have signed paperwork which will be entered into your electronic medical record.  These signatures attest to the fact that that the information above on your After Visit Summary has been reviewed and is understood.  Full responsibility of the confidentiality of this   discharge information lies with you and/or your care-partner.   You should probably have another colonoscopy in 5 yrs since your half brother had colon ca.

## 2012-11-11 NOTE — Op Note (Signed)
[facility] [address] [place], [address]   COLONOSCOPY PROCEDURE REPORT  PATIENT: Todd [name], Todd [name]  MR#: [id number] BIRTHDATE: 09-18-1940 , 71  yrs. old GENDER: Male ENDOSCOPIST: Mardella Layman, MD, [facility] REFERRED BY: PROCEDURE DATE:  11/11/2012 PROCEDURE:   Colonoscopy, screening ASA CLASS:   Class III INDICATIONS:Average risk patient for colon cancer. MEDICATIONS: propofol (Diprivan) 200mg  IV  DESCRIPTION OF PROCEDURE:   After the risks and benefits and of the procedure were explained, informed consent was obtained.  A digital rectal exam revealed no abnormalities of the rectum.    The LB [id number]  endoscope was introduced through the anus and advanced to the cecum, which was identified by both the appendix and ileocecal valve .  The quality of the prep was excellent, using MoviPrep .  The instrument was then slowly withdrawn as the colon was fully examined.     COLON FINDINGS: A normal appearing cecum, ileocecal valve, and appendiceal orifice were identified.  The ascending, hepatic flexure, transverse, splenic flexure, descending, sigmoid colon and rectum appeared unremarkable.  No polyps or cancers were seen. Mild diverticulosis was noted in the sigmoid colon.     Retroflexed views revealed no abnormalities.     The scope was then withdrawn from the patient and the procedure completed.  COMPLICATIONS: There were no complications. ENDOSCOPIC IMPRESSION: 1.   Normal colonn...nopolyps noted,. 2.   Mild diverticulosis was noted in the sigmoid colon  RECOMMENDATIONS: 1.  Continue current colorectal screening recommendations for "routine risk" patients with a repeat colonoscopy in 10 years. 2.  High fiber diet   REPEAT EXAM:  cc:Pecola Lawless, MD  _______________________________ eSigned:  Mardella Layman, MD, [facility] 11/11/2012 8:51 AM

## 2012-11-11 NOTE — Progress Notes (Addendum)
Patient did not have preoperative order for IV antibiotic SSI prophylaxis. (G8918)  Patient did not experience any of the following events: a burn prior to discharge; a fall within the facility; wrong site/side/patient/procedure/implant event; or a hospital transfer or hospital admission upon discharge from the facility. (G8907)  

## 2012-11-12 ENCOUNTER — Telehealth: Payer: Self-pay

## 2012-11-12 NOTE — Telephone Encounter (Signed)
Left message

## 2012-12-03 ENCOUNTER — Encounter: Payer: Self-pay | Admitting: Internal Medicine

## 2012-12-03 ENCOUNTER — Ambulatory Visit (INDEPENDENT_AMBULATORY_CARE_PROVIDER_SITE_OTHER): Payer: Medicare Other | Admitting: Internal Medicine

## 2012-12-03 VITALS — BP 128/86 | HR 72 | Temp 98.1°F | Resp 14 | Ht 68.0 in | Wt 217.0 lb

## 2012-12-03 DIAGNOSIS — Z0000 Encounter for general adult medical examination without abnormal findings: Secondary | ICD-10-CM

## 2012-12-03 DIAGNOSIS — E119 Type 2 diabetes mellitus without complications: Secondary | ICD-10-CM

## 2012-12-03 DIAGNOSIS — I10 Essential (primary) hypertension: Secondary | ICD-10-CM

## 2012-12-03 DIAGNOSIS — E785 Hyperlipidemia, unspecified: Secondary | ICD-10-CM

## 2012-12-03 DIAGNOSIS — Z23 Encounter for immunization: Secondary | ICD-10-CM

## 2012-12-03 LAB — MICROALBUMIN / CREATININE URINE RATIO
Creatinine,U: 163.6 mg/dL
Microalb Creat Ratio: 0.9 mg/g (ref 0.0–30.0)
Microalb, Ur: 1.5 mg/dL (ref 0.0–1.9)

## 2012-12-03 LAB — HEMOGLOBIN A1C: Hgb A1c MFr Bld: 6.9 % — ABNORMAL HIGH (ref 4.6–6.5)

## 2012-12-03 LAB — TSH: TSH: 1.54 u[IU]/mL (ref 0.35–5.50)

## 2012-12-03 LAB — CREATININE, SERUM: Creatinine, Ser: 0.8 mg/dL (ref 0.4–1.5)

## 2012-12-03 MED ORDER — METFORMIN HCL ER 500 MG PO TB24: ORAL_TABLET | ORAL | Status: DC

## 2012-12-03 NOTE — Progress Notes (Signed)
Subjective:    Patient ID: CRESPIN FORSTROM, male    DOB: 1941-08-10, 72 y.o.   MRN: [id number]  HPI Medicare Wellness Visit:  Psychosocial & medical history were reviewed as required by Medicare (abuse,antisocial behavioral risks,firearm risk).  Social history: caffeine: none , alcohol: no  ,  tobacco use: never   Exercise : weights & yardwork  No home & personal  safety / fall risk Activities of daily living: no limitations  Seatbelt  and smoke alarm employed. Power of Attorney/Living Will status : in place Ophthalmology exam current Hearing evaluation not current Orientation :oriented X 3  Memory & recall :good Math testing: good Mood & affect : normal . Depression / anxiety: denied Travel history : last 2009 [place]  Immunization status :Shingles /Flu/ PNA/ tetanus now current Transfusion history:  With CBAG 1999  Preventive health surveillance ( colonoscopy as per protocol/ [facility]): current  Dental care:  Every 6 mos. Chart reviewed &  Updated. Active issues reviewed & addressed.      Review of Systems Diabetes status assessment: Fasting or morning glucose range 89-148 . Glucose 2 hours after any meal not monitored. No hypoglycemia reported                                                                                                                 No specific nutrition/diet followed Medication compliance is good. No medication adverse effects noted. Foot care not current  A1c/ urine microalbumin 6.6% 06/06/12 (average sugar   143 with long-term 32 % risk )   No excess thirst ;  excess hunger ; or excess urination reported                              No lightheadedness with standing reported No chest pain ; palpitations ; claudication described .                                                                                                                             No non healing skin  ulcers or sores of extremities noted. No numbness or tingling or burning in feet  described  No significant change in weight  No blurred,double, or loss of vision reported  .    06/27/12 labs were reviewed; LDL was 56, triglycerides 93, and HDL 30.7. He is on prescription niacin medication.     Objective:   Physical Exam Gen.:  well-nourished in appearance. Alert, appropriate and cooperative throughout exam.  Head: Normocephalic without obvious abnormalities Eyes: No corneal or conjunctival inflammation noted. Vision grossly normal without lenses Ears: External  ear exam reveals no significant lesions or deformities. Canals clear .TMs normal. Hearing is grossly decreased bilaterally. Nose: External nasal exam reveals no deformity or inflammation. Nasal mucosa are pink and moist. No lesions or exudates noted.   Mouth: Oral mucosa and oropharynx reveal no lesions or exudates. Teeth in good repair. Neck: No deformities, masses, or tenderness noted. Range of motion & Thyroid normal. Lungs: Normal respiratory effort; chest expands symmetrically. Lungs are clear to auscultation without rales, wheezes, or increased work of breathing. Heart: Normal rate and rhythm. Normal S1 and S2. No gallop, click, or rub. Heart sounds distant. Abdomen: Bowel sounds normal; abdomen soft and nontender. No masses, organomegaly or hernias noted. Genitalia: S/P colonoscopy 11/11/12                               Musculoskeletal/extremities: There is some asymmetry of the posterior thoracic musculature suggesting occult scoliosis. No clubbing, cyanosis, edema, or significant extremity  deformity noted. Range of motion normal .Tone & strength  Normal. Joints  reveal mild  DJD DIP changes. Nail health good. Able to lie down & sit up w/o help. Negative SLR bilaterally Vascular: Carotid, radial artery, dorsalis pedis and  posterior tibial pulses are full and equal. No  bruits present. Neurologic: Alert and oriented x3. Deep tendon reflexes symmetrical and normal.   Light touch normal over feet.       Skin: Intact without suspicious lesions or rashes. Lymph: No cervical, axillary lymphadenopathy present. Psych: Mood and affect are normal. Normally interactive                                                                                       Assessment & Plan:  #1 Medicare Wellness Exam; criteria met ; data entered #2 Problem List reviewed ; Assessment/ Recommendations made Plan: see Orders

## 2012-12-03 NOTE — Patient Instructions (Addendum)
Preventive Health Care: Exercise at least 30-45 minutes a day,  3-4 days a week.  Eat a low-fat diet with lots of fruits and vegetables, up to 7-9 servings per day. This would eliminate the need for vitamin supplements. Consume less than 40 grams of sugar (preferably ZERO) per day from foods & drinks with High Fructose Corn Sugar as #1,2,3 or # 4 on label.  If you activate the  My Chart system; lab & Xray results will be released directly  to you as soon as I review & address these through the computer. If you choose not to sign up for My Chart within 36 hours of labs being drawn; results will be reviewed & interpretation added before being copied & mailed, causing a delay in getting the results to you.If you do not receive that report within 7-10 days ,please call. Additionally you can use this system to gain direct  access to your records  if  out of town or @ an office of a  physician who is not in  the My Chart network.  This improves continuity of care & places you in control of your medical record.  

## 2012-12-06 ENCOUNTER — Encounter: Payer: Self-pay | Admitting: *Deleted

## 2013-02-25 ENCOUNTER — Other Ambulatory Visit: Payer: Self-pay | Admitting: Internal Medicine

## 2013-04-16 ENCOUNTER — Telehealth: Payer: Self-pay | Admitting: Cardiology

## 2013-04-16 NOTE — Telephone Encounter (Signed)
CAROTID SCHEDULED PRIOR TO APPT WITH DR Eden Emms IN November ./CY

## 2013-04-16 NOTE — Telephone Encounter (Signed)
New problem   Pt want to speak to nurse concerning a carotid test he need.  Please call pt.

## 2013-05-13 ENCOUNTER — Telehealth: Payer: Self-pay | Admitting: Internal Medicine

## 2013-05-13 DIAGNOSIS — E119 Type 2 diabetes mellitus without complications: Secondary | ICD-10-CM

## 2013-05-13 NOTE — Telephone Encounter (Signed)
Patient wife called because she knew it was time for her husband to get his a1c checked  but was not sure if he needed to see dr hopper. Please advise me on what to tell the patient. Thanks

## 2013-05-15 NOTE — Telephone Encounter (Signed)
Please  schedule fasting Labs : BMET,A1c , urine microalb with OV 1-2 days later.Code 250.00

## 2013-05-15 NOTE — Telephone Encounter (Signed)
Spoke with the pt's wife and informed her of Dr. Frederik Pear recommendation below.  She agreed and stated that the pt has lab appt (05-21-13).  Future lab ordered and sent.  Wife also scheduled appt for the pt to see Dr. Hopper.//AB/CMA

## 2013-05-15 NOTE — Telephone Encounter (Signed)
Please advise 

## 2013-05-21 ENCOUNTER — Ambulatory Visit (INDEPENDENT_AMBULATORY_CARE_PROVIDER_SITE_OTHER): Payer: Medicare Other

## 2013-05-21 ENCOUNTER — Other Ambulatory Visit (INDEPENDENT_AMBULATORY_CARE_PROVIDER_SITE_OTHER): Payer: Medicare Other

## 2013-05-21 DIAGNOSIS — Z23 Encounter for immunization: Secondary | ICD-10-CM

## 2013-05-21 DIAGNOSIS — E119 Type 2 diabetes mellitus without complications: Secondary | ICD-10-CM

## 2013-05-21 LAB — MICROALBUMIN / CREATININE URINE RATIO
Creatinine,U: 184.8 mg/dL
Microalb Creat Ratio: 0.7 mg/g (ref 0.0–30.0)
Microalb, Ur: 1.3 mg/dL (ref 0.0–1.9)

## 2013-05-21 LAB — BASIC METABOLIC PANEL
BUN: 18 mg/dL (ref 6–23)
CO2: 27 mEq/L (ref 19–32)
Calcium: 9.1 mg/dL (ref 8.4–10.5)
Chloride: 108 mEq/L (ref 96–112)
Creatinine, Ser: 0.9 mg/dL (ref 0.4–1.5)
GFR: 93.36 mL/min (ref >60.00)
Glucose, Bld: 108 mg/dL — ABNORMAL HIGH (ref 70–99)
Potassium: 3.7 mEq/L (ref 3.5–5.1)
Sodium: 141 mEq/L (ref 135–145)

## 2013-05-21 LAB — HEMOGLOBIN A1C: Hgb A1c MFr Bld: 7.2 % — ABNORMAL HIGH (ref 4.6–6.5)

## 2013-05-26 ENCOUNTER — Encounter: Payer: Self-pay | Admitting: *Deleted

## 2013-05-26 NOTE — Progress Notes (Signed)
Letter mailed to patient.

## 2013-05-27 ENCOUNTER — Ambulatory Visit (INDEPENDENT_AMBULATORY_CARE_PROVIDER_SITE_OTHER)
Admission: RE | Admit: 2013-05-27 | Discharge: 2013-05-27 | Disposition: A | Discharge status: Home / Self Care | Payer: Medicare Other | Source: Ambulatory Visit | Attending: Internal Medicine | Admitting: Internal Medicine

## 2013-05-27 ENCOUNTER — Encounter: Payer: Self-pay | Admitting: Internal Medicine

## 2013-05-27 ENCOUNTER — Ambulatory Visit (INDEPENDENT_AMBULATORY_CARE_PROVIDER_SITE_OTHER): Payer: Medicare Other | Admitting: Internal Medicine

## 2013-05-27 VITALS — BP 144/83 | HR 60 | Temp 98.2°F | Resp 13 | Wt 219.6 lb

## 2013-05-27 DIAGNOSIS — I10 Essential (primary) hypertension: Secondary | ICD-10-CM

## 2013-05-27 DIAGNOSIS — E785 Hyperlipidemia, unspecified: Secondary | ICD-10-CM

## 2013-05-27 DIAGNOSIS — R0600 Dyspnea, unspecified: Secondary | ICD-10-CM

## 2013-05-27 DIAGNOSIS — R0989 Other specified symptoms and signs involving the circulatory and respiratory systems: Secondary | ICD-10-CM

## 2013-05-27 DIAGNOSIS — R0609 Other forms of dyspnea: Secondary | ICD-10-CM

## 2013-05-27 DIAGNOSIS — E119 Type 2 diabetes mellitus without complications: Secondary | ICD-10-CM | POA: Insufficient documentation

## 2013-05-27 NOTE — Patient Instructions (Addendum)
Order for x-rays entered into  the computer; these will be performed at [address] [facility]. across from [facility]. No appointment is necessary.  Minimal Blood Pressure Goal= AVERAGE < 140/90;  Ideal is an AVERAGE < 135/85. This AVERAGE should be calculated from @ least 5-7 BP readings taken @ different times of day on different days of week. You should not respond to isolated BP readings , but rather the AVERAGE for that week .Please bring your  blood pressure cuff to office visits to verify that it is reliable.It  can also be checked against the blood pressure device at the pharmacy. Finger or wrist cuffs are not dependable; an arm cuff is. Cardiovascular exercise, this can be as simple a program as walking, is recommended 30-45 minutes 3-4 times per week. If you're not exercising you should take 6-8 weeks to build up to this level.  Eat a low-fat diet with lots of fruits and vegetables, up to 7-9 servings per day. Consume less than 40   Grams (preferably ZERO) of sugar per day from foods & drinks with High Fructose Corn Syrup (HFCS) sugar as #1,2,3 or # 4 on label.Whole Foods, [employer] & [employer] do not carry products with HFCS. Follow a  low carb nutrition program such as [place] or The New Sugar [employer]usters  to prevent Diabetes progression . White carbohydrates (potatoes, rice, bread, and pasta) have a high spike of sugar and a high load of sugar. For example a  baked potato has a cup of sugar and a  french fry  2 teaspoons of sugar. Yams, wild  rice, whole grained bread &  wheat pasta have been much lower spike and load of  sugar. Portions should be the size of a deck of cards or your palm. Please schedule A1c & urine microalbumin in 4 months after nutritional & exercise interventions as discussed.

## 2013-05-27 NOTE — Progress Notes (Signed)
Subjective:    Patient ID: Todd [name], male    DOB: 1940-09-07, 72 y.o.   MRN: [id number]  HPI  He returns for followup of his diabetes; he is on metformin 500 mg twice a day. His A1c has risen from 6.9 - 7.2%. Urine microalbumin is normal at 1.3.  Glucoses range from 106-157. Two-hour postprandial glucoses are not checked. He has been compliant with his metformin. No hypoglycemia  He is on no specific nutritional program. Exercise consists of push mowing weekly.  He has had some exertional dyspnea with this activity. Recently he has had a nonproductive cough and some wheezing. He denies significant itchy, watery eyes, or sneezing. In fact he is being treated for dry eyes He questions whether this cough is related to postnasal drainage. Delsym helped cough. He has chronic L ankle edema.Renal function normal 10/1.  His lipids will be due next month. In November 2013 LDL was at goal at 56; HDL is mildly reduced at 30.7; triglycerides were excellent at 93.  In April of this year his TSH was therapeutic.    Review of Systems     No excess thirst ;  excess hunger ; or excess urination reported                              No lightheadedness with standing reported No chest pain ; palpitations ; claudication described .  No PNDyspnea                                                                                                                            No non healing skin  ulcers or sores of extremities noted. No numbness or tingling or burning in feet described                                                                                                                                             Change in weight of 9 pound gain . No blurred,double, or loss of vision reported. Ophth exam current; no retinopathy  .         Objective:   Physical Exam   Gen.:  well-nourished, appropriate and alert, weight excess Eyes: No lid/conjunctival changes, extraocular motion intact Neck: Normal  range of motion, thyroid normal Respiratory: No increased work  of breathing or abnormal breath sounds Cardiac : regular rhythm, no extra heart sounds, gallop, murmur. S4 Abdomen: No organomegaly ,masses, bruits or aortic enlargement. Ventral hernia Lymph: No lymphadenopathy of the neck or axilla Skin: No rashes, lesions, ulcers or ischemic changes Muscle skeletal: no nail changes; minor DIP joint changes Vasc:All pulses intact, no bruits present Trace edema RLE; 1/2+ LLE Neuro: Normal deep tendon reflexes, alert & oriented, sensation over feet normal Psych: judgment and insight, mood and affect normal          Assessment & Plan:  #1 diabetes, adequate control even without nutrition and exercise compliance  #2 cough possibly extrinsic. This is improved with OTC products.  #3 exertional dyspnea; this is most likely deconditioning.  Plan :see orders and recommendations

## 2013-05-28 ENCOUNTER — Encounter: Payer: Self-pay | Admitting: *Deleted

## 2013-05-30 ENCOUNTER — Telehealth: Payer: Self-pay | Admitting: Internal Medicine

## 2013-05-30 NOTE — Telephone Encounter (Signed)
Patient's wife is calling in regards to the patient's x-ray results from earlier this week. Please advise.

## 2013-05-30 NOTE — Telephone Encounter (Signed)
Spoke with patient's wife concerning x-ray results. She verbalized understanding of Dr. Frederik Pear instructions

## 2013-05-31 ENCOUNTER — Other Ambulatory Visit: Payer: Self-pay | Admitting: Internal Medicine

## 2013-06-02 ENCOUNTER — Other Ambulatory Visit: Payer: Self-pay | Admitting: *Deleted

## 2013-06-02 MED ORDER — FREESTYLE LANCETS MISC: Status: DC

## 2013-06-02 NOTE — Telephone Encounter (Signed)
Freestyle lancets refill sent to pharmacy

## 2013-07-09 ENCOUNTER — Ambulatory Visit: Payer: Medicare Other | Admitting: Cardiology

## 2013-07-09 ENCOUNTER — Encounter (HOSPITAL_COMMUNITY): Payer: Medicare Other

## 2013-07-15 ENCOUNTER — Encounter: Payer: Self-pay | Admitting: Cardiology

## 2013-07-15 ENCOUNTER — Ambulatory Visit (HOSPITAL_COMMUNITY): Payer: Medicare Other | Attending: Cardiology

## 2013-07-15 DIAGNOSIS — I2510 Atherosclerotic heart disease of native coronary artery without angina pectoris: Secondary | ICD-10-CM | POA: Insufficient documentation

## 2013-07-15 DIAGNOSIS — I10 Essential (primary) hypertension: Secondary | ICD-10-CM | POA: Insufficient documentation

## 2013-07-15 DIAGNOSIS — I658 Occlusion and stenosis of other precerebral arteries: Secondary | ICD-10-CM | POA: Insufficient documentation

## 2013-07-15 DIAGNOSIS — I6529 Occlusion and stenosis of unspecified carotid artery: Secondary | ICD-10-CM

## 2013-07-15 DIAGNOSIS — Z951 Presence of aortocoronary bypass graft: Secondary | ICD-10-CM | POA: Insufficient documentation

## 2013-07-15 DIAGNOSIS — I771 Stricture of artery: Secondary | ICD-10-CM | POA: Insufficient documentation

## 2013-07-30 ENCOUNTER — Encounter: Payer: Self-pay | Admitting: Cardiology

## 2013-07-30 ENCOUNTER — Encounter (INDEPENDENT_AMBULATORY_CARE_PROVIDER_SITE_OTHER): Payer: Self-pay

## 2013-07-30 ENCOUNTER — Ambulatory Visit (INDEPENDENT_AMBULATORY_CARE_PROVIDER_SITE_OTHER): Payer: Medicare Other | Admitting: Cardiology

## 2013-07-30 VITALS — BP 140/94 | HR 73 | Ht 68.0 in | Wt 217.0 lb

## 2013-07-30 DIAGNOSIS — I6529 Occlusion and stenosis of unspecified carotid artery: Secondary | ICD-10-CM

## 2013-07-30 DIAGNOSIS — I2510 Atherosclerotic heart disease of native coronary artery without angina pectoris: Secondary | ICD-10-CM

## 2013-07-30 DIAGNOSIS — I739 Peripheral vascular disease, unspecified: Secondary | ICD-10-CM

## 2013-07-30 DIAGNOSIS — I10 Essential (primary) hypertension: Secondary | ICD-10-CM

## 2013-07-30 DIAGNOSIS — E785 Hyperlipidemia, unspecified: Secondary | ICD-10-CM

## 2013-07-30 DIAGNOSIS — Z79899 Other long term (current) drug therapy: Secondary | ICD-10-CM

## 2013-07-30 LAB — HEPATIC FUNCTION PANEL
ALT: 28 U/L (ref 0–53)
AST: 31 U/L (ref 0–37)
Albumin: 4 g/dL (ref 3.5–5.2)
Alkaline Phosphatase: 63 U/L (ref 39–117)
Bilirubin, Direct: 0.1 mg/dL (ref 0.0–0.3)
Total Bilirubin: 0.6 mg/dL (ref 0.3–1.2)
Total Protein: 7.5 g/dL (ref 6.0–8.3)

## 2013-07-30 LAB — LIPID PANEL
Cholesterol: 137 mg/dL (ref 0–200)
HDL: 44.1 mg/dL (ref >39.00)
LDL Cholesterol: 79 mg/dL (ref 0–99)
Total CHOL/HDL Ratio: 3
Triglycerides: 72 mg/dL (ref 0.0–149.0)
VLDL: 14.4 mg/dL (ref 0.0–40.0)

## 2013-07-30 NOTE — Progress Notes (Signed)
HPI The patient presents for follow up of CAD.  He has had no new symptoms since stress perfusion study in 2012. The patient denies any new symptoms such as chest discomfort, neck or arm discomfort. There has been no new shortness of breath, PND or orthopnea. There have been no reported palpitations, presyncope or syncope.  He is active pushing a Conservation officer, nature.     Allergies  Allergen Reactions  . Amlodipine Besylate     Rash Because of a history of documented adverse serious drug reaction;Medi Alert bracelet  is recommended  . Ivp Dye [Iodinated Diagnostic Agents]     Rash Because of a history of documented adverse serious drug reaction;Medi Alert bracelet  is recommended    Current Outpatient Prescriptions  Medication Sig Dispense Refill  . aspirin 81 MG tablet Take 81 mg by mouth daily.        . cetirizine (ZYRTEC) 10 MG tablet Take 10 mg by mouth as needed.        . Cholecalciferol (VITAMIN D-3) 5000 UNITS TABS Take by mouth daily.        . fish oil-omega-3 fatty acids 1000 MG capsule Take 1 g by mouth. 2 by mouth in the am, 2 by mouth in the pm      . FREESTYLE LITE test strip USE TO CHECK BLOOD GLUCOSE DAILY AS DIRECTED  100 each  3  . Lancets (FREESTYLE) lancets USE TO TEST BLOOD SUGAR DAILY AS DIRECTED  100 each  0  . Lancets (FREESTYLE) lancets USE TO CHECK BLOOD GLUCOSE DAILY AS DIRECTED  100 each  3  . metFORMIN (GLUCOPHAGE-XR) 500 MG 24 hr tablet 1 by mouth twice daily with morning and evening meal  180 tablet  3  . metoprolol (LOPRESSOR) 50 MG tablet Take 1 tablet (50 mg total) by mouth 2 (two) times daily.  180 tablet  3  . Multiple Vitamin (MULTIVITAMIN) tablet Take 1 tablet by mouth daily.        . niacin (NIASPAN) 1000 MG CR tablet Take 1 tablet (1,000 mg total) by mouth at bedtime.  90 tablet  3  . nitroGLYCERIN (NITROSTAT) 0.4 MG SL tablet Place 1 tablet (0.4 mg total) under the tongue every 5 (five) minutes as needed for chest pain.  25 tablet  6  . omeprazole  (PRILOSEC) 20 MG capsule Take 1 capsule (20 mg total) by mouth daily.  90 capsule  3  . polycarbophil (FIBERCON) 625 MG tablet Take 625 mg by mouth. 2 by mouth in am and 2 by mouth in the pm      . simvastatin (ZOCOR) 40 MG tablet Take 1 tablet (40 mg total) by mouth at bedtime.  90 tablet  3   No current facility-administered medications for this visit.    Past Medical History  Diagnosis Date  . Elevated alkaline phosphatase level   . HLD (hyperlipidemia)   . HTN (hypertension)   . Allergic rhinitis   . Nephrolithiasis     Dr Jeffie Pollock  . CAD (coronary artery disease)   . Stroke 1999    mild stroke  . Asthma   . Diabetes mellitus without complication   . Myocardial infarction 1999    Past Surgical History  Procedure Laterality Date  . Colonoscopy w/ polypectomy  2009    Dr Sharlett Iles  . 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 Carotid endarterectomy  1999  . Nasal sinus surgery    . Colonoscopy  2014  negative    ROS:  As stated in the HPI and negative for all other systems.  PHYSICAL EXAM There were no vitals taken for this visit. GENERAL:  Well appearing HEENT:  Pupils equal round and reactive, fundi not visualized, oral mucosa unremarkable NECK:  No jugular venous distention, waveform within normal limits, carotid upstroke brisk and symmetric, no bruits, no thyromegaly LUNGS:  Clear to auscultation bilaterally CHEST:  Well healed sternotomy scar. HEART:  PMI not displaced or sustained,S1 and S2 within normal limits, no S3, no S4, no clicks, no rubs, no murmurs ABD:  Flat, positive bowel sounds normal in frequency in pitch, no bruits, no rebound, no guarding, no midline pulsatile mass, no hepatomegaly, no splenomegaly EXT:  2 plus pulses throughout, no edema, no cyanosis no clubbing  EKG:  Sinus rhythm, rate 69, axis within normal limits, intervals within normal limits, no acute ST-T wave changes.   07/30/2013  ASSESSMENT AND PLAN  C A D -  The patient has no new  sypmtoms since stress testing last year.  No further cardiovascular testing is indicated. We will continue with aggressive risk reduction and meds as listed.   CAROTID ARTERY DISEASE -  He had follow up today with stable plaque and will be followed again in 12 months.  HYPERLIPIDEMIA -  I will check a lipid profile today.    OVERWEIGHT/OBESITY -  He understands the need to lose weight and we have talked about this in the past.   HYPERTENSION - The blood pressure is at target. No change in medications is indicated. We will continue with therapeutic lifestyle changes (TLC).;

## 2013-07-30 NOTE — Patient Instructions (Signed)
The current medical regimen is effective;  continue present plan and medications.  Please have blood work today  (Lipid/liver)  Follow up in 1 year with Dr Antoine Poche.  You will receive a letter in the mail 2 months before you are due.  Please call us when you receive this letter to schedule your follow up appointment.

## 2013-09-01 ENCOUNTER — Telehealth: Payer: Self-pay | Admitting: Cardiology

## 2013-09-01 DIAGNOSIS — E785 Hyperlipidemia, unspecified: Secondary | ICD-10-CM

## 2013-09-01 DIAGNOSIS — I10 Essential (primary) hypertension: Secondary | ICD-10-CM

## 2013-09-01 DIAGNOSIS — I2510 Atherosclerotic heart disease of native coronary artery without angina pectoris: Secondary | ICD-10-CM

## 2013-09-01 MED ORDER — SIMVASTATIN 40 MG PO TABS: 40.0000 mg | ORAL_TABLET | Freq: Every day | ORAL | Status: DC

## 2013-09-01 MED ORDER — OMEPRAZOLE 20 MG PO CPDR: 20.0000 mg | DELAYED_RELEASE_CAPSULE | Freq: Two times a day (BID) | ORAL | Status: DC

## 2013-09-01 MED ORDER — METOPROLOL TARTRATE 50 MG PO TABS: 50.0000 mg | ORAL_TABLET | Freq: Two times a day (BID) | ORAL | Status: DC

## 2013-09-01 NOTE — Telephone Encounter (Signed)
Per wife - changed insurance and now need RX's to go to [facility]. 90 day supply with refills for 1 yr will be sent in.

## 2013-09-01 NOTE — Telephone Encounter (Signed)
New message     Talk to the nurse about husbands medications

## 2013-09-02 ENCOUNTER — Other Ambulatory Visit: Payer: Self-pay | Admitting: *Deleted

## 2013-09-02 DIAGNOSIS — E119 Type 2 diabetes mellitus without complications: Secondary | ICD-10-CM

## 2013-09-02 MED ORDER — METFORMIN HCL ER 500 MG PO TB24: ORAL_TABLET | ORAL | Status: DC

## 2013-09-02 MED ORDER — FREESTYLE LANCETS MISC: Status: DC

## 2013-09-02 MED ORDER — GLUCOSE BLOOD VI STRP: ORAL_STRIP | Status: DC

## 2013-09-02 NOTE — Telephone Encounter (Signed)
Refills for metformin, test strips and lancets sent to [facility] per request of pts wife.

## 2013-09-12 ENCOUNTER — Other Ambulatory Visit: Payer: Self-pay | Admitting: *Deleted

## 2013-09-12 DIAGNOSIS — E119 Type 2 diabetes mellitus without complications: Secondary | ICD-10-CM

## 2013-09-12 MED ORDER — FREESTYLE LANCETS MISC: Status: DC

## 2013-09-12 MED ORDER — GLUCOSE BLOOD VI STRP: ORAL_STRIP | Status: DC

## 2013-09-12 NOTE — Telephone Encounter (Signed)
Refill for freestyle test strips and lancets sent to [facility] on [address] per pt request.

## 2013-09-15 ENCOUNTER — Telehealth: Payer: Self-pay

## 2013-09-15 DIAGNOSIS — E119 Type 2 diabetes mellitus without complications: Secondary | ICD-10-CM

## 2013-09-15 MED ORDER — ACCU-CHEK NANO SMARTVIEW W/DEVICE KIT: 1.0000 | PACK | Status: DC | PRN

## 2013-09-15 MED ORDER — GLUCOSE BLOOD VI STRP: ORAL_STRIP | Status: DC

## 2013-09-15 MED ORDER — ACCU-CHEK SOFT TOUCH LANCETS MISC: Status: DC

## 2013-09-15 NOTE — Telephone Encounter (Signed)
Patient's wife request a new meter, lancets and test strips be sent to mail order. [facility] states they will provide it for free. New Rx sent.

## 2013-09-17 ENCOUNTER — Other Ambulatory Visit: Payer: Self-pay

## 2013-09-17 MED ORDER — NIACIN ER (ANTIHYPERLIPIDEMIC) 1000 MG PO TBCR: 1000.0000 mg | EXTENDED_RELEASE_TABLET | Freq: Every day | ORAL | Status: DC

## 2013-11-03 ENCOUNTER — Telehealth: Payer: Self-pay | Admitting: Cardiology

## 2013-11-03 ENCOUNTER — Inpatient Hospital Stay (HOSPITAL_COMMUNITY)
Admission: EM | Admit: 2013-11-03 | Discharge: 2013-11-04 | DRG: 310 | Disposition: A | Discharge status: Home / Self Care | Payer: Medicare HMO | Attending: Cardiology | Admitting: Cardiology

## 2013-11-03 ENCOUNTER — Emergency Department (HOSPITAL_COMMUNITY): Payer: Medicare HMO

## 2013-11-03 ENCOUNTER — Encounter (HOSPITAL_COMMUNITY): Payer: Self-pay | Admitting: Emergency Medicine

## 2013-11-03 DIAGNOSIS — I4891 Unspecified atrial fibrillation: Principal | ICD-10-CM

## 2013-11-03 DIAGNOSIS — J45909 Unspecified asthma, uncomplicated: Secondary | ICD-10-CM | POA: Diagnosis present

## 2013-11-03 DIAGNOSIS — Z8249 Family history of ischemic heart disease and other diseases of the circulatory system: Secondary | ICD-10-CM

## 2013-11-03 DIAGNOSIS — Z951 Presence of aortocoronary bypass graft: Secondary | ICD-10-CM

## 2013-11-03 DIAGNOSIS — I10 Essential (primary) hypertension: Secondary | ICD-10-CM

## 2013-11-03 DIAGNOSIS — Z8673 Personal history of transient ischemic attack (TIA), and cerebral infarction without residual deficits: Secondary | ICD-10-CM

## 2013-11-03 DIAGNOSIS — Z833 Family history of diabetes mellitus: Secondary | ICD-10-CM

## 2013-11-03 DIAGNOSIS — Z823 Family history of stroke: Secondary | ICD-10-CM

## 2013-11-03 DIAGNOSIS — E663 Overweight: Secondary | ICD-10-CM | POA: Diagnosis present

## 2013-11-03 DIAGNOSIS — I252 Old myocardial infarction: Secondary | ICD-10-CM

## 2013-11-03 DIAGNOSIS — E669 Obesity, unspecified: Secondary | ICD-10-CM | POA: Diagnosis present

## 2013-11-03 DIAGNOSIS — E785 Hyperlipidemia, unspecified: Secondary | ICD-10-CM

## 2013-11-03 DIAGNOSIS — Z6832 Body mass index (BMI) 32.0-32.9, adult: Secondary | ICD-10-CM

## 2013-11-03 DIAGNOSIS — E109 Type 1 diabetes mellitus without complications: Secondary | ICD-10-CM | POA: Diagnosis present

## 2013-11-03 DIAGNOSIS — E119 Type 2 diabetes mellitus without complications: Secondary | ICD-10-CM

## 2013-11-03 DIAGNOSIS — I739 Peripheral vascular disease, unspecified: Secondary | ICD-10-CM

## 2013-11-03 DIAGNOSIS — I2510 Atherosclerotic heart disease of native coronary artery without angina pectoris: Secondary | ICD-10-CM

## 2013-11-03 DIAGNOSIS — Z794 Long term (current) use of insulin: Secondary | ICD-10-CM

## 2013-11-03 DIAGNOSIS — I480 Paroxysmal atrial fibrillation: Secondary | ICD-10-CM | POA: Diagnosis present

## 2013-11-03 DIAGNOSIS — I779 Disorder of arteries and arterioles, unspecified: Secondary | ICD-10-CM | POA: Diagnosis present

## 2013-11-03 DIAGNOSIS — I6529 Occlusion and stenosis of unspecified carotid artery: Secondary | ICD-10-CM

## 2013-11-03 HISTORY — DX: Type 2 diabetes mellitus without complications: E11.9

## 2013-11-03 HISTORY — DX: Unspecified atrial fibrillation: I48.91

## 2013-11-03 LAB — COMPREHENSIVE METABOLIC PANEL
ALT: 25 U/L (ref 0–53)
AST: 27 U/L (ref 0–37)
Albumin: 3.5 g/dL (ref 3.5–5.2)
Alkaline Phosphatase: 72 U/L (ref 39–117)
BUN: 15 mg/dL (ref 6–23)
CO2: 22 mEq/L (ref 19–32)
Calcium: 9.7 mg/dL (ref 8.4–10.5)
Chloride: 103 mEq/L (ref 96–112)
Creatinine, Ser: 0.74 mg/dL (ref 0.50–1.35)
GFR calc Af Amer: >90 mL/min (ref >90)
GFR calc non Af Amer: >90 mL/min (ref >90)
Glucose, Bld: 233 mg/dL — ABNORMAL HIGH (ref 70–99)
Potassium: 4 mEq/L (ref 3.7–5.3)
Sodium: 141 mEq/L (ref 137–147)
Total Bilirubin: 0.4 mg/dL (ref 0.3–1.2)
Total Protein: 7 g/dL (ref 6.0–8.3)

## 2013-11-03 LAB — I-STAT TROPONIN, ED: Troponin i, poc: 0.01 ng/mL (ref 0.00–0.08)

## 2013-11-03 LAB — CBC WITH DIFFERENTIAL/PLATELET
Basophils Absolute: 0 10*3/uL (ref 0.0–0.1)
Basophils Relative: 0 % (ref 0–1)
Eosinophils Absolute: 0.1 10*3/uL (ref 0.0–0.7)
Eosinophils Relative: 2 % (ref 0–5)
HCT: 39 % (ref 39.0–52.0)
Hemoglobin: 14.1 g/dL (ref 13.0–17.0)
Lymphocytes Relative: 34 % (ref 12–46)
Lymphs Abs: 1.8 10*3/uL (ref 0.7–4.0)
MCH: 33.7 pg (ref 26.0–34.0)
MCHC: 36.2 g/dL — ABNORMAL HIGH (ref 30.0–36.0)
MCV: 93.3 fL (ref 78.0–100.0)
Monocytes Absolute: 0.4 10*3/uL (ref 0.1–1.0)
Monocytes Relative: 8 % (ref 3–12)
Neutro Abs: 3 10*3/uL (ref 1.7–7.7)
Neutrophils Relative %: 56 % (ref 43–77)
Platelets: 143 10*3/uL — ABNORMAL LOW (ref 150–400)
RBC: 4.18 MIL/uL — ABNORMAL LOW (ref 4.22–5.81)
RDW: 13.6 % (ref 11.5–15.5)
WBC: 5.4 10*3/uL (ref 4.0–10.5)

## 2013-11-03 LAB — MAGNESIUM: Magnesium: 1.8 mg/dL (ref 1.5–2.5)

## 2013-11-03 LAB — PHOSPHORUS: Phosphorus: 1.9 mg/dL — ABNORMAL LOW (ref 2.3–4.6)

## 2013-11-03 LAB — CBG MONITORING, ED: Glucose-Capillary: 171 mg/dL — ABNORMAL HIGH (ref 70–99)

## 2013-11-03 MED ORDER — HEPARIN BOLUS VIA INFUSION
4000.0000 [IU] | Freq: Once | INTRAVENOUS | Status: AC
  Administered 2013-11-03: 4000 [IU] via INTRAVENOUS
  Filled 2013-11-03: qty 4000

## 2013-11-03 MED ORDER — HEPARIN (PORCINE) IN NACL 100-0.45 UNIT/ML-% IJ SOLN
1350.0000 [IU]/h | INTRAMUSCULAR | Status: AC
  Administered 2013-11-03 – 2013-11-04 (×2): 1400 [IU]/h via INTRAVENOUS
  Filled 2013-11-03 (×3): qty 250

## 2013-11-03 NOTE — Progress Notes (Signed)
ANTICOAGULATION CONSULT NOTE - Initial Consult  Pharmacy Consult for heparin Indication: atrial fibrillation  Allergies  Allergen Reactions  . Amlodipine Besylate     Rash Because of a history of documented adverse serious drug reaction;Medi Alert bracelet  is recommended  . Ivp Dye [Iodinated Diagnostic Agents]     Rash Because of a history of documented adverse serious drug reaction;Medi Alert bracelet  is recommended    Patient Measurements: Height: 5\' 8"  (172.7 cm) Weight: 230 lb (104.327 kg) IBW/kg (Calculated) : 68.4 Heparin Dosing Weight: 95kg  Vital Signs: Temp: 99.6 F (37.6 C) (03/16 1805) Temp src: Oral (03/16 1805) BP: 140/99 mmHg (03/16 2245) Pulse Rate: 83 (03/16 2245)  Labs:  Recent Labs  11/03/13 1908  HGB 14.1  HCT 39.0  PLT 143*  CREATININE 0.74    Estimated Creatinine Clearance: 100.6 ml/min (by C-G formula based on Cr of 0.74).   Medical History: Past Medical History  Diagnosis Date  . Elevated alkaline phosphatase level   . HLD (hyperlipidemia)   . HTN (hypertension)   . Allergic rhinitis   . Nephrolithiasis     Dr Jeffie Pollock  . CAD (coronary artery disease)   . Stroke 1999    mild stroke  . Asthma   . Diabetes mellitus without complication   . Myocardial infarction 1999    Assessment: 73yo male called [facility] this afternoon reporting that his heart felt like it was "beating out of his chest" and associated w/ numb neck and weakness, did not feel well enough to take BP meds, RN asked pt to take his metoprolol, when she called back his HR was still >100 and she asked him to report to ED, found to be in new Afib, to begin heparin.  Goal of Therapy:  Heparin level 0.3-0.7 units/ml Monitor platelets by anticoagulation protocol: Yes   Plan:  Will give heparin 4000 units x1 followed by gtt at 1400 units/hr and monitor heparin levels and CBC.  Wynona Neat, PharmD, BCPS  11/03/2013,11:02 PM

## 2013-11-03 NOTE — ED Notes (Addendum)
Per ems: noticed weakness and discomfort in neck that started around 1530, ems reprots pt is in afib, no hx of same. Denies sob, had bypass surgery 15 years ago, denies pain, nausea, denies dizziness  148/82 130 afib rr 20

## 2013-11-03 NOTE — Telephone Encounter (Signed)
Spoke with both wife and pt - BP 152/108 HR 131 (most recent) 144/99  HR 113 and  142/106  Pt states about 15 mins after eating lunch he started to feel badly and decided to take his BP.  He feels like his heart is beating out of his chest, his neck feels numb and he reports weakness.  Advised pt to go to the ED for evaluation.  He would prefer not to.  Advise to take his Metoprolol now - which he usually takes around 5 pm and that I will call back to check on him.  He is in agreement.

## 2013-11-03 NOTE — ED Notes (Signed)
Admitting MD at BS.  

## 2013-11-03 NOTE — H&P (Signed)
Cardiology History and Physical  Todd Cobble, MD  History of Present Illness (and review of medical records): Todd [name] is a 73 y.o. male who presents for evaluation of palpitations.  He is followed by Dr. Percival Spanish with known hx of CAD s/p CABG, hx of MI, CVA, carotid artery disease, HTN, DM and HLD.  He stated that he felt fatigued for most of the day.  Around 2pm, he had sudden onset of palpitations.  He called the office and was instructed to take an additional dose of Metoprolol and come to ED.  He denied any shortness of breath, chest pain, presyncope or syncope.  His ecg revealed atrial fibrillation HR 118.  He remained symptom free in the ED.  Review of Systems Further review of systems was otherwise negative other than stated in HPI.  Patient Active Problem List   Diagnosis Date Noted  . Type II or unspecified type diabetes mellitus without mention of complication, not stated as uncontrolled 05/27/2013  . CAROTID ARTERY DISEASE 12/20/2009  . OVERWEIGHT/OBESITY 11/17/2008  . PVD 11/17/2008  . HYPERLIPIDEMIA 07/20/2008  . HYPERTENSION 07/20/2008  . C A D 07/20/2008  . ALLERGIC RHINITIS 07/20/2008  . NEPHROLITHIASIS, HX OF 07/20/2008   Past Medical History  Diagnosis Date  . Elevated alkaline phosphatase level   . HLD (hyperlipidemia)   . HTN (hypertension)   . Allergic rhinitis   . Nephrolithiasis     Dr Jeffie Pollock  . CAD (coronary artery disease)   . Stroke 1999    mild stroke  . Asthma   . Diabetes mellitus without complication   . Myocardial infarction 1999    Past Surgical History  Procedure Laterality Date  . Colonoscopy w/ polypectomy  2009    Dr Sharlett Iles  . 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 Carotid endarterectomy  1999  . Nasal sinus surgery    . Colonoscopy  2014    negative    Current Facility-Administered Medications  Medication Dose Route Frequency Provider Last Rate Last Dose  . heparin ADULT infusion 100 units/mL (25000 units/250 mL)   1,400 Units/hr Intravenous Continuous Rogue Bussing, [facility] 14 mL/hr at 11/03/13 2313 1,400 Units/hr at 11/03/13 2313   Current Outpatient Prescriptions  Medication Sig Dispense Refill  . aspirin 81 MG tablet Take 81 mg by mouth daily.        . Blood Glucose Monitoring Suppl (ACCU-CHEK NANO SMARTVIEW) W/DEVICE KIT 1 Device by Does not apply route as needed.  1 kit  0  . cetirizine (ZYRTEC) 10 MG tablet Take 10 mg by mouth as needed.        . Cholecalciferol (VITAMIN D-3) 5000 UNITS TABS Take 5,000 Units by mouth daily.       . fish oil-omega-3 fatty acids 1000 MG capsule Take 1 g by mouth. 2 by mouth in the am, 2 by mouth in the pm      . glucose blood test strip Use as instructed  100 each  12  . Lancets (ACCU-CHEK SOFT TOUCH) lancets Use as instructed  100 each  12  . metFORMIN (GLUCOPHAGE-XR) 500 MG 24 hr tablet Take 500 mg by mouth 2 (two) times daily.      . metoprolol (LOPRESSOR) 50 MG tablet Take 1 tablet (50 mg total) by mouth 2 (two) times daily.  180 tablet  3  . Multiple Vitamin (MULTIVITAMIN) tablet Take 1 tablet by mouth daily.        . niacin (NIASPAN) 1000 MG CR tablet  Take 1 tablet (1,000 mg total) by mouth at bedtime.  90 tablet  3  . nitroGLYCERIN (NITROSTAT) 0.4 MG SL tablet Place 1 tablet (0.4 mg total) under the tongue every 5 (five) minutes as needed for chest pain.  25 tablet  6  . omeprazole (PRILOSEC) 20 MG capsule Take 1 capsule (20 mg total) by mouth 2 (two) times daily before a meal.  180 capsule  3  . polycarbophil (FIBERCON) 625 MG tablet Take 625 mg by mouth. 2 by mouth in am and 2 by mouth in the pm      . simvastatin (ZOCOR) 40 MG tablet Take 1 tablet (40 mg total) by mouth at bedtime.  90 tablet  3    Allergies  Allergen Reactions  . Amlodipine Besylate     Rash Because of a history of documented adverse serious drug reaction;Medi Alert bracelet  is recommended  . Ivp Dye [Iodinated Diagnostic Agents]     Rash Because of a history of documented  adverse serious drug reaction;Medi Alert bracelet  is recommended    History  Substance Use Topics  . Smoking status: Never Smoker   . Smokeless tobacco: Never Used  . Alcohol Use: No    Family History  Problem Relation Age of Onset  . Asthma Sister   . Prostate cancer Brother   . Hemochromatosis Brother   . Heart attack Mother 32  . Colon cancer Brother 44  . Heart attack Brother 61  . Diabetes Father     IDDM  . Stroke Father 38  . Heart attack Brother 57     Objective:  Patient Vitals for the past 8 hrs:  BP Temp Temp src Pulse Resp SpO2 Height Weight  11/03/13 2230 130/75 mmHg - - 79 24 98 % - -  11/03/13 2215 113/55 mmHg - - 61 13 95 % - -  11/03/13 2200 130/92 mmHg - - - 17 97 % - -  11/03/13 2145 116/74 mmHg - - 50 12 97 % - -  11/03/13 2130 125/69 mmHg - - 58 16 97 % - -  11/03/13 2115 131/81 mmHg - - - 18 98 % - -  11/03/13 2100 119/68 mmHg - - 80 15 97 % - -  11/03/13 2045 118/75 mmHg - - 66 24 96 % - -  11/03/13 2030 128/70 mmHg - - 94 23 97 % - -  11/03/13 2015 128/89 mmHg - - 90 12 97 % - -  11/03/13 2000 115/78 mmHg - - 42 16 96 % - -  11/03/13 1945 124/70 mmHg - - 61 21 96 % - -  11/03/13 1935 108/72 mmHg - - 129 20 97 % - -  11/03/13 1930 108/72 mmHg - - 126 16 96 % - -  11/03/13 1913 116/73 mmHg - - 97 18 98 % - -  11/03/13 1900 115/70 mmHg - - 106 23 96 % - -  11/03/13 1845 116/73 mmHg - - 58 22 95 % - -  11/03/13 1830 111/66 mmHg - - 81 22 97 % - -  11/03/13 1815 122/63 mmHg - - 60 19 98 % - -  11/03/13 1809 - - - - - 96 % - -  11/03/13 1805 137/62 mmHg 99.6 F (37.6 C) Oral 119 15 96 % - -  11/03/13 1802 - - - - - - 5' 8"  (1.727 m) 104.327 kg (230 lb)   General appearance: alert, cooperative, appears stated age and no distress  Head: Normocephalic, without obvious abnormality, atraumatic Eyes: conjunctivae/corneas clear. PERRL, EOM's intact. Fundi benign. Neck: no carotid bruit, no JVD and supple, symmetrical, trachea midline Lungs: clear to  auscultation bilaterally Chest wall: no tenderness, midline well healed scar Heart: irregular rate and rhythm, S1, S2 normal,  Abdomen: soft, non-tender; bowel sounds normal; no masses,  no organomegaly Extremities: extremities normal, atraumatic, no cyanosis or edema Pulses: 2+ and symmetric Neurologic: Grossly normal  Results for orders placed during the hospital encounter of 11/03/13 (from the past 48 hour(s))  CBC WITH DIFFERENTIAL     Status: Abnormal   Collection Time    11/03/13  7:08 PM      Result Value Ref Range   WBC 5.4  4.0 - 10.5 K/uL   RBC 4.18 (*) 4.22 - 5.81 MIL/uL   Hemoglobin 14.1  13.0 - 17.0 g/dL   HCT 39.0  39.0 - 52.0 %   MCV 93.3  78.0 - 100.0 fL   MCH 33.7  26.0 - 34.0 pg   MCHC 36.2 (*) 30.0 - 36.0 g/dL   RDW 13.6  11.5 - 15.5 %   Platelets 143 (*) 150 - 400 K/uL   Neutrophils Relative % 56  43 - 77 %   Neutro Abs 3.0  1.7 - 7.7 K/uL   Lymphocytes Relative 34  12 - 46 %   Lymphs Abs 1.8  0.7 - 4.0 K/uL   Monocytes Relative 8  3 - 12 %   Monocytes Absolute 0.4  0.1 - 1.0 K/uL   Eosinophils Relative 2  0 - 5 %   Eosinophils Absolute 0.1  0.0 - 0.7 K/uL   Basophils Relative 0  0 - 1 %   Basophils Absolute 0.0  0.0 - 0.1 K/uL  COMPREHENSIVE METABOLIC PANEL     Status: Abnormal   Collection Time    11/03/13  7:08 PM      Result Value Ref Range   Sodium 141  137 - 147 mEq/L   Potassium 4.0  3.7 - 5.3 mEq/L   Chloride 103  96 - 112 mEq/L   CO2 22  19 - 32 mEq/L   Glucose, Bld 233 (*) 70 - 99 mg/dL   BUN 15  6 - 23 mg/dL   Creatinine, Ser 0.74  0.50 - 1.35 mg/dL   Calcium 9.7  8.4 - 10.5 mg/dL   Total Protein 7.0  6.0 - 8.3 g/dL   Albumin 3.5  3.5 - 5.2 g/dL   AST 27  0 - 37 U/L   ALT 25  0 - 53 U/L   Alkaline Phosphatase 72  39 - 117 U/L   Total Bilirubin 0.4  0.3 - 1.2 mg/dL   GFR calc non Af Amer >90  >90 mL/min   GFR calc Af Amer >90  >90 mL/min   Comment: (NOTE)     The eGFR has been calculated using the CKD EPI equation.     This  calculation has not been validated in all clinical situations.     eGFR's persistently <90 mL/min signify possible Chronic Kidney     Disease.  MAGNESIUM     Status: None   Collection Time    11/03/13  7:08 PM      Result Value Ref Range   Magnesium 1.8  1.5 - 2.5 mg/dL  PHOSPHORUS     Status: Abnormal   Collection Time    11/03/13  7:08 PM      Result Value Ref Range  Phosphorus 1.9 (*) 2.3 - 4.6 mg/dL  Randolm Idol, ED     Status: None   Collection Time    11/03/13  7:14 PM      Result Value Ref Range   Troponin i, poc 0.01  0.00 - 0.08 ng/mL   Comment 3            Comment: Due to the release kinetics of cTnI,     a negative result within the first hours     of the onset of symptoms does not rule out     myocardial infarction with certainty.     If myocardial infarction is still suspected,     repeat the test at appropriate intervals.  CBG MONITORING, ED     Status: Abnormal   Collection Time    11/03/13  9:52 PM      Result Value Ref Range   Glucose-Capillary 171 (*) 70 - 99 mg/dL   Dg Chest Port 1 View  11/03/2013   CLINICAL DATA:  Palpitations and tachycardia  EXAM: PORTABLE CHEST - 1 VIEW  COMPARISON:  05/27/2013  FINDINGS: Previous median sternotomy and CABG procedure. The heart size and mediastinal contours are within normal limits. Both lungs are clear. The visualized skeletal structures are unremarkable.  IMPRESSION: No active disease.   Electronically Signed   By: Kerby Moors M.D.   On: 11/03/2013 19:34    ECG:  Atrial fibrillation with RVR HR 118, occasional PVCs.  Prior sinus rhythm with FAVB.  Assessment: 54M hx of CAD, s/p CABG '99, hx of MI, CVA, carotid disease, HTN, DM, HLD p/w with new onset atrial fibrillation.  Plan: 1. Cardiology  Admission  2. Continuous monitoring on Telemetry. 3. Repeat ekg on admit, prn chest pain or arrythmia 4. Cycle cardiac biomarkers 5. Will rate control with IV Dilt  overnight 6. IV heparin for anticoagulation high  CHADSVASC score, discussed R/B/A. 7. If does not convert to SR, likely consider DCCV.

## 2013-11-03 NOTE — ED Notes (Signed)
Pt given a [place] sandwich and graham crackers per MD approval.

## 2013-11-03 NOTE — Telephone Encounter (Signed)
Called back to check on pt - BP is better at 130/88 but HR remains elevated at 118.  Pt c/o feeling very weak.  Advised to report to the ED.

## 2013-11-03 NOTE — ED Provider Notes (Addendum)
CSN: [id number]     Arrival date & time 11/03/13  1754 History   First MD Initiated Contact with Patient 11/03/13 1801     Chief Complaint  Patient presents with  . Atrial Fibrillation     (Consider location/radiation/quality/duration/timing/severity/associated sxs/prior Treatment) Patient is a 73 y.o. male presenting with atrial fibrillation. The history is provided by the patient.  Atrial Fibrillation This is a new problem. The current episode started yesterday (most likely yesterday.  was feeling tired but palpitations started at 2pm today). The problem occurs constantly. The problem has been resolved. Pertinent negatives include no chest pain. Associated symptoms comments: Fatigue, generalized weakness, tingling in the neck with palpitations only.  Denies CP, SOB. Nothing aggravates the symptoms. Nothing relieves the symptoms. Treatments tried: toprol. The treatment provided significant relief.    Past Medical History  Diagnosis Date  . Elevated alkaline phosphatase level   . HLD (hyperlipidemia)   . HTN (hypertension)   . Allergic rhinitis   . Nephrolithiasis     Dr Jeffie Pollock  . CAD (coronary artery disease)   . Stroke 1999    mild stroke  . Asthma   . Diabetes mellitus without complication   . Myocardial infarction 1999   Past Surgical History  Procedure Laterality Date  . Colonoscopy w/ polypectomy  2009    Dr Sharlett Iles  . 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 Carotid endarterectomy  1999  . Nasal sinus surgery    . Colonoscopy  2014    negative   Family History  Problem Relation Age of Onset  . Asthma Sister   . Prostate cancer Brother   . Hemochromatosis Brother   . Heart attack Mother 80  . Colon cancer Brother 41  . Heart attack Brother 55  . Diabetes Father     IDDM  . Stroke Father 61  . Heart attack Brother 29   History  Substance Use Topics  . Smoking status: Never Smoker   . Smokeless tobacco: Never Used  . Alcohol Use: No    Review of  Systems  Cardiovascular: Negative for chest pain.  All other systems reviewed and are negative.      Allergies  Amlodipine besylate and Ivp dye  Home Medications   Current Outpatient Rx  Name  Route  Sig  Dispense  Refill  . aspirin 81 MG tablet   Oral   Take 81 mg by mouth daily.           . Blood Glucose Monitoring Suppl (ACCU-CHEK NANO SMARTVIEW) W/DEVICE KIT   Does not apply   1 Device by Does not apply route as needed.   1 kit   0   . cetirizine (ZYRTEC) 10 MG tablet   Oral   Take 10 mg by mouth as needed.           . Cholecalciferol (VITAMIN D-3) 5000 UNITS TABS   Oral   Take 5,000 Units by mouth daily.          . fish oil-omega-3 fatty acids 1000 MG capsule   Oral   Take 1 g by mouth. 2 by mouth in the am, 2 by mouth in the pm         . glucose blood test strip      Use as instructed   100 each   12   . Lancets (ACCU-CHEK SOFT TOUCH) lancets      Use as instructed   100 each   12   .  metFORMIN (GLUCOPHAGE-XR) 500 MG 24 hr tablet   Oral   Take 500 mg by mouth 2 (two) times daily.         . metoprolol (LOPRESSOR) 50 MG tablet   Oral   Take 1 tablet (50 mg total) by mouth 2 (two) times daily.   180 tablet   3   . Multiple Vitamin (MULTIVITAMIN) tablet   Oral   Take 1 tablet by mouth daily.           . niacin (NIASPAN) 1000 MG CR tablet   Oral   Take 1 tablet (1,000 mg total) by mouth at bedtime.   90 tablet   3   . nitroGLYCERIN (NITROSTAT) 0.4 MG SL tablet   Sublingual   Place 1 tablet (0.4 mg total) under the tongue every 5 (five) minutes as needed for chest pain.   25 tablet   6   . omeprazole (PRILOSEC) 20 MG capsule   Oral   Take 1 capsule (20 mg total) by mouth 2 (two) times daily before a meal.   180 capsule   3   . polycarbophil (FIBERCON) 625 MG tablet   Oral   Take 625 mg by mouth. 2 by mouth in am and 2 by mouth in the pm         . simvastatin (ZOCOR) 40 MG tablet   Oral   Take 1 tablet (40 mg total)  by mouth at bedtime.   90 tablet   3    BP 108/72  Pulse 129  Temp(Src) 99.6 F (37.6 C) (Oral)  Resp 20  Ht 5' 8"  (1.727 m)  Wt 230 lb (104.327 kg)  BMI 34.98 kg/m2  SpO2 97% Physical Exam  Nursing note and vitals reviewed. Constitutional: He is oriented to person, place, and time. He appears well-developed and well-nourished. No distress.  HENT:  Head: Normocephalic and atraumatic.  Mouth/Throat: Oropharynx is clear and moist.  Eyes: Conjunctivae and EOM are normal. Pupils are equal, round, and reactive to light.  Neck: Normal range of motion. Neck supple.  Cardiovascular: Normal rate and intact distal pulses.  An irregularly irregular rhythm present.  No murmur heard. Pulmonary/Chest: Effort normal and breath sounds normal. No respiratory distress. He has no wheezes. He has no rales.  Abdominal: Soft. He exhibits no distension. There is no tenderness. There is no rebound and no guarding.  Musculoskeletal: Normal range of motion. He exhibits no edema and no tenderness.  Neurological: He is alert and oriented to person, place, and time.  Skin: Skin is warm and dry. No rash noted. No erythema.  Psychiatric: He has a normal mood and affect. His behavior is normal.    ED Course  Procedures (including critical care time) Labs Review Labs Reviewed  CBC WITH DIFFERENTIAL - Abnormal; Notable for the following:    RBC 4.18 (*)    MCHC 36.2 (*)    Platelets 143 (*)    All other components within normal limits  COMPREHENSIVE METABOLIC PANEL - Abnormal; Notable for the following:    Glucose, Bld 233 (*)    All other components within normal limits  PHOSPHORUS - Abnormal; Notable for the following:    Phosphorus 1.9 (*)    All other components within normal limits  MAGNESIUM  I-STAT TROPOININ, ED   Imaging Review Dg Chest Port 1 View  11/03/2013   CLINICAL DATA:  Palpitations and tachycardia  EXAM: PORTABLE CHEST - 1 VIEW  COMPARISON:  05/27/2013  FINDINGS: Previous median  sternotomy and CABG procedure. The heart size and mediastinal contours are within normal limits. Both lungs are clear. The visualized skeletal structures are unremarkable.  IMPRESSION: No active disease.   Electronically Signed   By: Kerby Moors M.D.   On: 11/03/2013 19:34     EKG Interpretation   Date/Time:  Monday November 03 2013 18:01:39 EDT Ventricular Rate:  118 PR Interval:    QRS Duration: 87 QT Interval:  338 QTC Calculation: 474 R Axis:   68 Text Interpretation:  new Atrial fibrillation Consider anterior infarct  Confirmed by Maryan Rued  MD, Loree Fee ([id number]) on 11/03/2013 6:55:50 PM      MDM   Final diagnoses:  New onset atrial fibrillation    Pt presenting with feelings of palpitations, weakness and fatigue.  Palpitations started today but fatigue was yesterday.  Pt found to be in new onset afib with RVR at urgent care and sent here.  Pt has taken extra metoprolol and now rate is between 90-110.  No chest pain or pressure.  Heart hx s/p CABG.  Pt well appearing and currently no c/o.  EKG with a.fib.  CXR wnl.  CBC, CMP, magnesium, troponin all within normal limits. Phosphorous slightly low.  9:36 PM Labs wnl. hR going between 80-120.  Discussed with cardiology who will come and see the pt.  Blanchie Dessert, MD 11/03/13 2136  Blanchie Dessert, MD 11/03/13 2137

## 2013-11-03 NOTE — ED Notes (Signed)
Pt reports he is feeling lightheaded, pt reports the last time he ate was 1430 today. Pt believes he is lightheaded as he has not eaten anything, will check CBG.

## 2013-11-03 NOTE — Telephone Encounter (Signed)
New message         C/o fluttered heart b/p is 163/108 heart rate is 115 about 15 min after dinner.

## 2013-11-03 NOTE — ED Notes (Signed)
DAUGHTER KIM: [phone] (CELL) WIFE JUDY: [phone] (CELL)   PLEASE CALL WITH UPDATES, WILL COME TOMORROW MORNING TO SEE PT.

## 2013-11-04 ENCOUNTER — Encounter (HOSPITAL_COMMUNITY): Payer: Self-pay | Admitting: Physician Assistant

## 2013-11-04 ENCOUNTER — Other Ambulatory Visit: Payer: Self-pay | Admitting: Physician Assistant

## 2013-11-04 DIAGNOSIS — I739 Peripheral vascular disease, unspecified: Secondary | ICD-10-CM

## 2013-11-04 DIAGNOSIS — I4891 Unspecified atrial fibrillation: Principal | ICD-10-CM

## 2013-11-04 DIAGNOSIS — I10 Essential (primary) hypertension: Secondary | ICD-10-CM

## 2013-11-04 DIAGNOSIS — E785 Hyperlipidemia, unspecified: Secondary | ICD-10-CM

## 2013-11-04 DIAGNOSIS — E119 Type 2 diabetes mellitus without complications: Secondary | ICD-10-CM

## 2013-11-04 DIAGNOSIS — I6529 Occlusion and stenosis of unspecified carotid artery: Secondary | ICD-10-CM

## 2013-11-04 DIAGNOSIS — I2510 Atherosclerotic heart disease of native coronary artery without angina pectoris: Secondary | ICD-10-CM

## 2013-11-04 LAB — CBC
HCT: 37.9 % — ABNORMAL LOW (ref 39.0–52.0)
Hemoglobin: 13.6 g/dL (ref 13.0–17.0)
MCH: 33.7 pg (ref 26.0–34.0)
MCHC: 35.9 g/dL (ref 30.0–36.0)
MCV: 93.8 fL (ref 78.0–100.0)
Platelets: 123 10*3/uL — ABNORMAL LOW (ref 150–400)
RBC: 4.04 MIL/uL — ABNORMAL LOW (ref 4.22–5.81)
RDW: 13.6 % (ref 11.5–15.5)
WBC: 5.1 10*3/uL (ref 4.0–10.5)

## 2013-11-04 LAB — GLUCOSE, CAPILLARY
Glucose-Capillary: 114 mg/dL — ABNORMAL HIGH (ref 70–99)
Glucose-Capillary: 124 mg/dL — ABNORMAL HIGH (ref 70–99)

## 2013-11-04 LAB — HEPARIN LEVEL (UNFRACTIONATED)
Heparin Unfractionated: 0.57 IU/mL (ref 0.30–0.70)
Heparin Unfractionated: 0.66 IU/mL (ref 0.30–0.70)

## 2013-11-04 LAB — BASIC METABOLIC PANEL
BUN: 16 mg/dL (ref 6–23)
CO2: 27 mEq/L (ref 19–32)
Calcium: 9.3 mg/dL (ref 8.4–10.5)
Chloride: 106 mEq/L (ref 96–112)
Creatinine, Ser: 0.85 mg/dL (ref 0.50–1.35)
GFR calc Af Amer: >90 mL/min (ref >90)
GFR calc non Af Amer: 86 mL/min — ABNORMAL LOW (ref >90)
Glucose, Bld: 121 mg/dL — ABNORMAL HIGH (ref 70–99)
Potassium: 4.3 mEq/L (ref 3.7–5.3)
Sodium: 144 mEq/L (ref 137–147)

## 2013-11-04 LAB — TROPONIN I: Troponin I: <0.3 ng/mL (ref <0.30)

## 2013-11-04 LAB — PROTIME-INR
INR: 1.17 (ref 0.00–1.49)
Prothrombin Time: 14.7 seconds (ref 11.6–15.2)

## 2013-11-04 MED ORDER — RIVAROXABAN 20 MG PO TABS
20.0000 mg | ORAL_TABLET | Freq: Every day | ORAL | Status: DC
  Administered 2013-11-04: 20 mg via ORAL
  Filled 2013-11-04: qty 1

## 2013-11-04 MED ORDER — SODIUM CHLORIDE 0.9 % IJ SOLN: 3.0000 mL | Freq: Two times a day (BID) | INTRAMUSCULAR | Status: DC

## 2013-11-04 MED ORDER — INSULIN ASPART 100 UNIT/ML ~~LOC~~ SOLN: 0.0000 [IU] | Freq: Three times a day (TID) | SUBCUTANEOUS | Status: DC

## 2013-11-04 MED ORDER — RIVAROXABAN 20 MG PO TABS: 20.0000 mg | ORAL_TABLET | Freq: Every day | ORAL | Status: DC

## 2013-11-04 MED ORDER — DILTIAZEM HCL 100 MG IV SOLR
5.0000 mg/h | INTRAVENOUS | Status: DC
  Filled 2013-11-04: qty 100

## 2013-11-04 MED ORDER — METOPROLOL TARTRATE 50 MG PO TABS
50.0000 mg | ORAL_TABLET | Freq: Two times a day (BID) | ORAL | Status: DC
  Administered 2013-11-04: 50 mg via ORAL
  Filled 2013-11-04 (×3): qty 1

## 2013-11-04 MED ORDER — MAGNESIUM SULFATE IN D5W 10-5 MG/ML-% IV SOLN
1.0000 g | Freq: Once | INTRAVENOUS | Status: AC
  Administered 2013-11-04: 1 g via INTRAVENOUS
  Filled 2013-11-04: qty 100

## 2013-11-04 MED ORDER — NITROGLYCERIN 0.4 MG SL SUBL: 0.4000 mg | SUBLINGUAL_TABLET | SUBLINGUAL | Status: DC | PRN

## 2013-11-04 MED ORDER — PANTOPRAZOLE SODIUM 40 MG PO TBEC
40.0000 mg | DELAYED_RELEASE_TABLET | Freq: Every day | ORAL | Status: DC
  Administered 2013-11-04: 40 mg via ORAL
  Filled 2013-11-04: qty 1

## 2013-11-04 MED ORDER — SIMVASTATIN 40 MG PO TABS
40.0000 mg | ORAL_TABLET | Freq: Every day | ORAL | Status: DC
  Administered 2013-11-04: 40 mg via ORAL
  Filled 2013-11-04 (×2): qty 1

## 2013-11-04 MED ORDER — NIACIN ER (ANTIHYPERLIPIDEMIC) 500 MG PO TBCR
1000.0000 mg | EXTENDED_RELEASE_TABLET | Freq: Every day | ORAL | Status: DC
  Administered 2013-11-04: 1000 mg via ORAL
  Filled 2013-11-04 (×2): qty 2

## 2013-11-04 MED ORDER — RIVAROXABAN 20 MG PO TABS
20.0000 mg | ORAL_TABLET | Freq: Every day | ORAL | Status: DC
  Filled 2013-11-04 (×2): qty 1

## 2013-11-04 MED ORDER — MAGNESIUM SULFATE 50 % IJ SOLN
1.0000 g | Freq: Once | INTRAMUSCULAR | Status: DC
  Filled 2013-11-04: qty 2

## 2013-11-04 NOTE — Care Management Note (Signed)
    Page 1 of 1   11/05/2013     2:39:35 PM   CARE MANAGEMENT NOTE 11/05/2013  Patient:  ANITA, MCADORY R   Account Number:  [id number]  Date Initiated:  11/04/2013  Documentation initiated by:  Carlee Vonderhaar  Subjective/Objective Assessment:   PT ADM WITH AFIB ON 3/16.  PTA, PT RESIDES AT [place].     Action/Plan:   CM REFERRAL FOR XARELTO.  PT GIVEN 30 DAY FREE TRIAL CARD. CARD HAS BEEN ACTIVATED.  WILL CHECK MEDICATION COPAY.   Anticipated DC Date:  11/05/2013   Anticipated DC Plan:  HOME/SELF CARE      DC Planning Services  CM consult  Medication Assistance      Choice offered to / List presented to:             Status of service:  Completed, signed off Medicare Important Message given?   (If response is "NO", the following Medicare IM given date fields will be blank) Date Medicare IM given:   Date Additional Medicare IM given:    Discharge Disposition:  HOME/SELF CARE  Per UR Regulation:  Reviewed for med. necessity/level of care/duration of stay  If discussed at [place] of Stay Meetings, dates discussed:    CommentsAntoine Primas [phone] 11/04/13 per rep at [place]: $45 at retail, no auth needed

## 2013-11-04 NOTE — Progress Notes (Signed)
ANTICOAGULATION CONSULT NOTE - Follow up [place] for heparin Indication: atrial fibrillation  Allergies  Allergen Reactions  . Amlodipine Besylate     Rash Because of a history of documented adverse serious drug reaction;Medi Alert bracelet  is recommended  . Ivp Dye [Iodinated Diagnostic Agents]     Rash Because of a history of documented adverse serious drug reaction;Medi Alert bracelet  is recommended    Patient Measurements: Height: 5\' 8"  (172.7 cm) Weight: 214 lb 12.8 oz (97.433 kg) IBW/kg (Calculated) : 68.4 Heparin Dosing Weight: 95kg  Vital Signs: Temp: 97.8 F (36.6 C) (03/17 0452) Temp src: Oral (03/17 0452) BP: 133/76 mmHg (03/17 0452) Pulse Rate: 65 (03/17 0452)  Labs:  Recent Labs  11/03/13 1908 11/04/13 0535 11/04/13 1200  HGB 14.1 13.6  --   HCT 39.0 37.9*  --   PLT 143* 123*  --   LABPROT  --  14.7  --   INR  --  1.17  --   HEPARINUNFRC  --  0.57 0.66  CREATININE 0.74 0.85  --     Estimated Creatinine Clearance: 91.5 ml/min (by C-G formula based on Cr of 0.85).   Assessment: 73yo male on heparin for Afib. S/p two consecutive therapeutic heparin levels but at higher end of goal range this AM at 0.66. Will lower infusion rate slightly. RN reports that heparin line is infusing well and no unusual s/s of bleeding noted.    Goal of Therapy:  Heparin level 0.3-0.7 units/ml Monitor platelets by anticoagulation protocol: Yes   Plan:  -Decrease heparin infusion rate to 1350 units/hr  -Monitor daily HL, CBC and s/s of bleeding   Albertina Parr, PharmD.  Clinical Pharmacist Pager [phone]

## 2013-11-04 NOTE — Progress Notes (Signed)
ANTICOAGULATION CONSULT NOTE - Follow Up Consult  Pharmacy Consult for heparin Indication: atrial fibrillation  Labs:  Recent Labs  11/03/13 1908 11/04/13 0535  HGB 14.1 13.6  HCT 39.0 37.9*  PLT 143* 123*  LABPROT  --  14.7  INR  --  1.17  HEPARINUNFRC  --  0.57  CREATININE 0.74 0.85    Assessment/Plan:  73yo male therapeutic on heparin with initial dosing for Afib. Will continue gtt at current rate and confirm stable with additional level.   Wynona Neat, PharmD, BCPS  11/04/2013,7:14 AM

## 2013-11-04 NOTE — Progress Notes (Signed)
Patient Name: Todd [name] Date of Encounter: 11/04/2013     Active Problems:   Atrial fibrillation with RVR    SUBJECTIVE  Feels fine no symptoms today.   CURRENT MEDS . insulin aspart  0-9 Units Subcutaneous TID WC  . metoprolol  50 mg Oral BID  . niacin  1,000 mg Oral QHS  . pantoprazole  40 mg Oral Daily  . simvastatin  40 mg Oral QHS  . sodium chloride  3 mL Intravenous Q12H    OBJECTIVE  Filed Vitals:   11/03/13 2330 11/03/13 2345 11/04/13 0022 11/04/13 0452  BP: 110/69 106/50 127/81 133/76  Pulse: 81 70 75 65  Temp:   98.2 F (36.8 C) 97.8 F (36.6 C)  TempSrc:   Oral Oral  Resp: 20 14 22 18   Height:   5\' 8"  (1.727 m)   Weight:   214 lb 12.8 oz (97.433 kg)   SpO2: 95% 97% 99% 97%    Intake/Output Summary (Last 24 hours) at 11/04/13 1111 Last data filed at 11/04/13 0800  Gross per 24 hour  Intake      0 ml  Output    900 ml  Net   -900 ml   Filed Weights   11/03/13 1802 11/04/13 0022  Weight: 230 lb (104.327 kg) 214 lb 12.8 oz (97.433 kg)    PHYSICAL EXAM  General: Pleasant, NAD. Neuro: Alert and oriented X 3. Moves all extremities spontaneously. Psych: Normal affect. HEENT:  Normal  Neck: Supple without bruits or JVD. Lungs:  Resp regular and unlabored, CTA. Heart: RRR no s3, s4, or murmurs. Abdomen: Soft, non-tender, non-distended, BS + x 4.  Extremities: No clubbing, cyanosis or edema. DP/PT/Radials 2+ and equal bilaterally.  Accessory Clinical Findings  CBC  Recent Labs  11/03/13 1908 11/04/13 0535  WBC 5.4 5.1  NEUTROABS 3.0  --   HGB 14.1 13.6  HCT 39.0 37.9*  MCV 93.3 93.8  PLT 143* 062*   Basic Metabolic Panel  Recent Labs  11/03/13 1908 11/04/13 0535  NA 141 144  K 4.0 4.3  CL 103 106  CO2 22 27  GLUCOSE 233* 121*  BUN 15 16  CREATININE 0.74 0.85  CALCIUM 9.7 9.3  MG 1.8  --   PHOS 1.9*  --    Liver Function Tests  Recent Labs  11/03/13 1908  AST 27  ALT 25  ALKPHOS 72  BILITOT 0.4  PROT 7.0    ALBUMIN 3.5   TELE - appears to have converted to NSR this morning at roughly 6 AM  Radiology/Studies  Dg Chest Port 1 View  11/03/2013   CLINICAL DATA:  Palpitations and tachycardia  EXAM: PORTABLE CHEST - 1 VIEW  COMPARISON:  05/27/2013  FINDINGS: Previous median sternotomy and CABG procedure. The heart size and mediastinal contours are within normal limits. Both lungs are clear. The visualized skeletal structures are unremarkable.  IMPRESSION: No active disease.   Electronically Signed   By: Kerby Moors M.D.   On: 11/03/2013 19:34    ASSESSMENT AND PLAN 14M hx of CAD, s/p CABG '99, hx of MI, CVA, carotid disease, HTN, DM, HLD p/w with new onset atrial fibrillation  New onset Afib- spontaneously converted to NSR -- CHADSVasc score- 6 HTN (1), DM (1), previous TIA (2), age 24 (1), prior MI (1). He will require anticoagulation. Wife is on xarelto, so he would prefer to be on this agent. Will give one dose of Xarelto today with a meal and  d/c heparin. --  Will consult case management for xarelto card.  -- ECHO today to assess LV function, if cannot do today- he can go home and have it done as an outpatient. He will also need an ischemic work up with stress test as an outpatient.   CAD- troponin POC x1, no chest pain - continue ASA/BB/statin  Hypomagnesia- will supplement with 1mg  mag sulf.  HTN- blood pressure well controlled. Continue metoprolol 50 BID  HLD- continue statin    Tyrell Antonio PA-C  Pager [phone]  I've seen and evaluated the patient along with Lorretta Harp, PA-C. I agree with her findings on examination recommendations.  73 year old with significant CHADS2Vasc risk factors of of CAD-CABG, TIA/CVA with carotid disease, hypertension and diabetes. He was in his usual state of health yesterday and when he started noting a weird fluttering sensation in his chest and neck. Has a history of atrial fibrillation and he felt like his father was a short oblique in  direction that's what it was. Findings the converted overnight at roughly 6 morning. He had no anginal or chest pain symptoms associated with and did not have any heart failure symptoms.  This point, we can convert him to oral medications, but would like to see what his ejection fraction is prior to determining whether or not we use diltiazem. Not have an echocardiogram in over 2 years. I would like to go ahead and initiate oral and hydration. His wife is currently on Zoloft so, and after long discussion he would prefer to guide and using medication as she is. We'll get case management to come and assist with Medicare coverage. Will likely need the cord set up for a reduced cost.  With a new onset A. fib and a general with a history of cardiac disease, would need to evaluate with Myoview, however with the absence of symptoms this can be done as an outpatient.  I think he is stable for discharge, but would prefer to have the echocardiogram results. He should get his first dose of Xarelto today with a meal.  Otherwise continue home medications including beta blocker.  Leonie Man, M.D., M.S. Interventional Cardiologist  [place] Pager # [phone] 11/04/2013

## 2013-11-04 NOTE — Discharge Instructions (Signed)
Atrial Fibrillation  Atrial fibrillation is a type of irregular heart rhythm (arrhythmia). During atrial fibrillation, the upper chambers of the heart (atria) quiver continuously in a chaotic pattern. This causes an irregular and often rapid heart rate.   Atrial fibrillation is the result of the heart becoming overloaded with disorganized signals that tell it to beat. These signals are normally released one at a time by a part of the right atrium called the sinoatrial node. They then travel from the atria to the lower chambers of the heart (ventricles), causing the atria and ventricles to contract and pump blood as they pass. In atrial fibrillation, parts of the atria outside of the sinoatrial node also release these signals. This results in two problems. First, the atria receive so many signals that they do not have time to fully contract. Second, the ventricles, which can only receive one signal at a time, beat irregularly and out of rhythm with the atria.   There are three types of atrial fibrillation:    Paroxysmal Paroxysmal atrial fibrillation starts suddenly and stops on its own within a week.    Persistent Persistent atrial fibrillation lasts for more than a week. It may stop on its own or with treatment.    Permanent Permanent atrial fibrillation does not go away. Episodes of atrial fibrillation may lead to permanent atrial fibrillation.   Atrial fibrillation can prevent your heart from pumping blood normally. It increases your risk of stroke and can lead to heart failure.   CAUSES    Heart conditions, including a heart attack, heart failure, coronary artery disease, and heart valve conditions.    Inflammation of the sac that surrounds the heart (pericarditis).    Blockage of an artery in the lungs (pulmonary embolism).    Pneumonia or other infections.    Chronic lung disease.    Thyroid problems, especially if the thyroid is overactive (hyperthyroidism).    Caffeine, excessive alcohol  use, and use of some illegal drugs.    Use of some medications, including certain decongestants and diet pills.    Heart surgery.    Birth defects.   Sometimes, no cause can be found. When this happens, the atrial fibrillation is called lone atrial fibrillation. The risk of complications from atrial fibrillation increases if you have lone atrial fibrillation and you are age 60 years or older.  RISK FACTORS   Heart failure.   Coronary artery disease   Diabetes mellitus.    High blood pressure (hypertension).    Obesity.    Other arrhythmias.    Increased age.  SYMPTOMS    A feeling that your heart is beating rapidly or irregularly.    A feeling of discomfort or pain in your chest.    Shortness of breath.    Sudden lightheadedness or weakness.    Getting tired easily when exercising.    Urinating more often than normal (mainly when atrial fibrillation first begins).   In paroxysmal atrial fibrillation, symptoms may start and suddenly stop.  DIAGNOSIS   Your caregiver may be able to detect atrial fibrillation when taking your pulse. Usually, testing is needed to diagnosis atrial fibrillation. Tests may include:    Electrocardiography. During this test, the electrical impulses of your heart are recorded while you are lying down.    Echocardiography. During echocardiography, sound waves are used to evaluate how blood flows through your heart.    Stress test. There is more than one type of stress test. If a stress test is   needed, ask your caregiver about which type is best for you.    Chest X-ray exam.    Blood tests.    Computed tomography (CT).   TREATMENT    Treating any underlying conditions. For example, if you have an overactive thyroid, treating the condition may correct atrial fibrillation.    Medication. Medications may be given to control a rapid heart rate or to prevent blood clots, heart failure, or a stroke.    Procedure to correct the rhythm of the  heart:   Electrical cardioversion. During electrical cardioversion, a controlled, low-energy shock is delivered to the heart through your skin. If you have chest pain, very low pressure blood pressure, or sudden heart failure, this procedure may need to be done as an emergency.   Catheter ablation. During this procedure, heart tissues that send the signals that cause atrial fibrillation are destroyed.   Maze or minimaze procedure. During this surgery, thin lines of heart tissue that carry the abnormal signals are destroyed. The maze procedure is an open-heart surgery. The minimaze procedure is a minimally invasive surgery. This means that small cuts are made to access the heart instead of a large opening.   Pulmonary venous isolation. During this surgery, tissue around the veins that carry blood from the lungs (pulmonary veins) is destroyed. This tissue is thought to carry the abnormal signals.  HOME CARE INSTRUCTIONS    Take medications as directed by your caregiver.   Only take medications that your caregiver approves. Some medications can make atrial fibrillation worse or recur.   If blood thinners were prescribed by your caregiver, take them exactly as directed. Too much can cause bleeding. Too little and you will not have the needed protection against stroke and other problems.   Perform blood tests at home if directed by your caregiver.   Perform blood tests exactly as directed.    Quit smoking if you smoke.    Do not drink alcohol.    Do not drink caffeinated beverages such as coffee, soda, and some teas. You may drink decaffeinated coffee, soda, or tea.    Maintain a healthy weight. Do not use diet pills unless your caregiver approves. They may make heart problems worse.    Follow diet instructions as directed by your caregiver.    Exercise regularly as directed by your caregiver.    Keep all follow-up appointments.  PREVENTION   The following substances can cause atrial fibrillation  to recur:    Caffeinated beverages.    Alcohol.    Certain medications, especially those used for breathing problems.    Certain herbs and herbal medications, such as those containing ephedra or ginseng.   Illegal drugs such as cocaine and amphetamines.  Sometimes medications are given to prevent atrial fibrillation from recurring. Proper treatment of any underlying condition is also important in helping prevent recurrence.   SEEK MEDICAL CARE IF:   You notice a change in the rate, rhythm, or strength of your heartbeat.    You suddenly begin urinating more frequently.    You tire more easily when exerting yourself or exercising.   SEEK IMMEDIATE MEDICAL CARE IF:    You develop chest pain, abdominal pain, sweating, or weakness.   You feel sick to your stomach (nauseous).   You develop shortness of breath.   You suddenly develop swollen feet and ankles.   You feel dizzy.   You face or limbs feel numb or weak.   There is a change in your   vision or speech.  MAKE SURE YOU:    Understand these instructions.   Will watch your condition.   Will get help right away if you are not doing well or get worse.  Document Released: 08/07/2005 Document Revised: 12/02/2012 Document Reviewed: 09/17/2012  [employer] Patient Information 2014 [employer].

## 2013-11-04 NOTE — Discharge Summary (Signed)
Discharge Summary   Patient ID: Todd [name] MRN: [id number], DOB/AGE: Feb 12, 1941 73 y.o. Admit date: 11/03/2013 D/C date:     11/04/2013  Primary Cardiologist: Dr. Percival Spanish  Primary Problem:   Atrial fibrillation with RVR Active Problems:   Hyperlipidemia    Obesity Body mass index is 32.67 kg/(m^2).   Hypertension   Coronary artery disease- s/p CABG: L IMA to LAD, SVG to OM1, SVG to OM2, SVG to RCA ('99);. normal myovue 2012   Carotic artery disease- s/p endarterectomy ('99)   Peripheral vascular disease    Admission Dates: 11/03/13 - 11/04/13 Discharge Diagnosis: Atrial fibrillation with RVR  HPI: Todd [name] is a 73 y.o. male with a history of CAD s/p CABG '99, TIA/CVA with carotid disease s/p endarterectomy '99, HTN, DM, and HLD who was admitted on 11/03/13 with new onset atrial fibrillation with RVR.  Hospital Course:  New onset Afib with RVR- The patient presented to [facility] ED with symptoms of fatigue and palpitations. He denied chest pain or shortness of breath. EKG revealed Afib with RVR (HR 118). He was started on IV heparin and IV diltiazem with good heart rate control. He spontaneously converted to NSR around 6 am the next morning. A discussion about anticoagulation was necessary with his high CHADSVasc score of 6: HTN (1), DM (1), previous TIA (2), age 89 (1), prior MI (1). His wife is on Xarelto and so he prefered to be on this agent. He was given one dose of Xarelto on the day of discharge and heparin was discontinued at that time. Care management provided him with a card for free month supply of Xarelto. It was planned to obtain an ECHO on this admission to assess LV function; however, due to scheduling delays it was elected to be done as an outpatient. He will also require a non-invasive ischemic work up as an outpatient with new onset atrial fibrillation in the setting of a known history of CAD.   CAD- Troponin remained normal and there were no acute ST or TW changes on his  EKG. He did not have any chest pain or associated symptoms. He will continue his ASA/BB/statin regimen.   Hypomagnesia- His magnesium was noted to be slightly low and he was supplemented with IV mag so4.  HTN- His blood pressure was well controlled and he will continue metoprolol 50 BID.    The patient has had an uncomplicated hospital course and is recovering well. He has been seen by Dr. Ellyn Hack today and deemed stable for discharge home. All follow-up appointments have been scheduled, including an outpatient 2D ECHO and exercise myovue. Discharge medications include metoprolol 79m BID, ASA, simvastatin 46mand Xarelto 2031mHe was provided with a 30 day free card for Xarelto.    Discharge Vitals: Blood pressure 114/57, pulse 71, temperature 98 F (36.7 C), temperature source Oral, resp. rate 20, height _0  (1.727 m), weight 214 lb 12.8 oz (97.433 kg), SpO2 98.00%.  Labs: Lab Results  Component Value Date   WBC 5.1 11/04/2013   HGB 13.6 11/04/2013   HCT 37.9* 11/04/2013   MCV 93.8 11/04/2013   PLT 123* 11/04/2013     Recent Labs Lab 11/03/13 1908 11/04/13 0535  NA 141 144  K 4.0 4.3  CL 103 106  CO2 22 27  BUN 15 16  CREATININE 0.74 0.85  CALCIUM 9.7 9.3  PROT 7.0  --   BILITOT 0.4  --   ALKPHOS 72  --   ALT  25  --   AST 27  --   GLUCOSE 233* 121*    Recent Labs  11/04/13 1314  TROPONINI <0.30     Diagnostic Studies/Procedures   Dg Chest Port 1 View  11/03/2013   CLINICAL DATA:  Palpitations and tachycardia  EXAM: PORTABLE CHEST - 1 VIEW  COMPARISON:  05/27/2013  FINDINGS: Previous median sternotomy and CABG procedure. The heart size and mediastinal contours are within normal limits. Both lungs are clear. The visualized skeletal structures are unremarkable.  IMPRESSION: No active disease.   Electronically Signed   By: Kerby Moors M.D.   On: 11/03/2013 19:34    Discharge Medications     Medication List         ACCU-CHEK NANO SMARTVIEW W/DEVICE Kit  1  Device by Does not apply route as needed.     accu-chek soft touch lancets  Use as instructed     aspirin 81 MG tablet  Take 81 mg by mouth daily.     cetirizine 10 MG tablet  Commonly known as:  ZYRTEC  Take 10 mg by mouth as needed.     fish oil-omega-3 fatty acids 1000 MG capsule  Take 1 g by mouth. 2 by mouth in the am, 2 by mouth in the pm     glucose blood test strip  Use as instructed     metFORMIN 500 MG 24 hr tablet  Commonly known as:  GLUCOPHAGE-XR  Take 500 mg by mouth 2 (two) times daily.     metoprolol 50 MG tablet  Commonly known as:  LOPRESSOR  Take 1 tablet (50 mg total) by mouth 2 (two) times daily.     multivitamin tablet  Take 1 tablet by mouth daily.     niacin 1000 MG CR tablet  Commonly known as:  NIASPAN  Take 1 tablet (1,000 mg total) by mouth at bedtime.     nitroGLYCERIN 0.4 MG SL tablet  Commonly known as:  NITROSTAT  Place 1 tablet (0.4 mg total) under the tongue every 5 (five) minutes as needed for chest pain.     omeprazole 20 MG capsule  Commonly known as:  PRILOSEC  Take 1 capsule (20 mg total) by mouth 2 (two) times daily before a meal.     polycarbophil 625 MG tablet  Commonly known as:  FIBERCON  Take 625 mg by mouth. 2 by mouth in am and 2 by mouth in the pm     Rivaroxaban 20 MG Tabs tablet  Commonly known as:  XARELTO  Take 1 tablet (20 mg total) by mouth daily with supper.     simvastatin 40 MG tablet  Commonly known as:  ZOCOR  Take 1 tablet (40 mg total) by mouth at bedtime.     Vitamin D-3 5000 UNITS Tabs  Take 5,000 Units by mouth daily.        Disposition   The patient will be discharged in stable condition to home.  Future Appointments Provider Department Dept Phone   11/10/2013 12:15 PM [facility] 2 (Nuc Treadm) [place] [phone]   11/10/2013 4:00 PM [facility] Echo Echo 2 [facility] CARDIOVASCULAR IMAGING ECHO [address] [phone]   11/12/2013 12:15 PM [place], PA-C  [place] Office [phone]   12/05/2013 9:30 AM Hendricks Limes, MD [facility] [phone]     Follow-up Information   Follow up with Melina Copa, PA-C On 11/12/2013. (@ 12:15pm)  Specialty:  Cardiology   Contact information:   [address] [place] [address] [phone]       Follow up with [facility] On 11/10/2013. (You have an appointment at 12:15pm for a stress test and 4pm for an ultrasound of your heart.)    Contact information:   [address] [place] [address]         Duration of Discharge Encounter: Greater than 30 minutes including physician and PA time.  SignedPerry Mount PA-C 11/04/2013, 10:10 PM   Patient was admitted for fluttering sensation in his chest he was found to be nature fibrillation with RVR. He was given with IV diltiazem along with his oral beta blocker dose. He spontaneously converted early in the morning. He was relatively stable upon initial evaluation. After long talk about the best options for anticoagulation based on this at high CHADS2VASC2 score, he felt it best to go on Xarelto which is what his wife is also taking.  Care management was consulted to assist with medication needs. We initially planned to have an echocardiogram performed to assess EF in this gentleman with history of coronary disease and carotid disease. However by late in the afternoon it became apparent that this would not occur D2 extreme back on an echocardiogram as. Therefore since he was stable symptomatically, it was felt that he was safe for discharge with plans to have an echocardiogram and stress test done as an outpatient before following up with Dr. Percival Spanish.    Leonie Man, M.D., M.S. Interventional Cardiologist  [place] Pager # [phone] 11/05/2013

## 2013-11-05 ENCOUNTER — Other Ambulatory Visit: Payer: Self-pay | Admitting: *Deleted

## 2013-11-05 MED ORDER — RIVAROXABAN 20 MG PO TABS: 20.0000 mg | ORAL_TABLET | Freq: Every day | ORAL | Status: DC

## 2013-11-10 ENCOUNTER — Other Ambulatory Visit: Payer: Self-pay

## 2013-11-10 ENCOUNTER — Ambulatory Visit (HOSPITAL_BASED_OUTPATIENT_CLINIC_OR_DEPARTMENT_OTHER): Payer: Medicare HMO | Admitting: Radiology

## 2013-11-10 ENCOUNTER — Ambulatory Visit (HOSPITAL_COMMUNITY): Payer: Medicare HMO | Attending: Cardiology | Admitting: Radiology

## 2013-11-10 VITALS — BP 117/81 | Ht 68.0 in | Wt 212.0 lb

## 2013-11-10 DIAGNOSIS — I2510 Atherosclerotic heart disease of native coronary artery without angina pectoris: Secondary | ICD-10-CM

## 2013-11-10 DIAGNOSIS — I4891 Unspecified atrial fibrillation: Secondary | ICD-10-CM

## 2013-11-10 MED ORDER — TECHNETIUM TC 99M SESTAMIBI GENERIC - CARDIOLITE
11.0000 | Freq: Once | INTRAVENOUS | Status: AC | PRN
  Administered 2013-11-10: 11 via INTRAVENOUS

## 2013-11-10 MED ORDER — REGADENOSON 0.4 MG/5ML IV SOLN
0.4000 mg | Freq: Once | INTRAVENOUS | Status: AC
  Administered 2013-11-10: 0.4 mg via INTRAVENOUS

## 2013-11-10 MED ORDER — TECHNETIUM TC 99M SESTAMIBI GENERIC - CARDIOLITE
33.0000 | Freq: Once | INTRAVENOUS | Status: AC | PRN
  Administered 2013-11-10: 33 via INTRAVENOUS

## 2013-11-10 NOTE — Progress Notes (Signed)
\   [place] [facility] NUCLEAR MED [address] [place], [place] [address] [phone]    Cardiology Nuclear Med Study  Todd [name] is a 73 y.o. male     MRN : [id number]     DOB: 12-15-1940  Procedure Date: 11/10/2013  Nuclear Med Background Indication for Stress Test:  Evaluation for Ischemia, Graft Patency and Post Hospital:Atrial Fib with RVR History:  MI,CABG,'12 MPI:EF=66%,normal;11/10/13 Echo:pending and new onset of Atrial Fib Cardiac Risk Factors: Carotid Disease, CVA, Family History - CAD, Hypertension, Lipids, NIDDM and PVD  Symptoms:  Fatigue and Palpitations   Nuclear Pre-Procedure Caffeine/Decaff Intake:  None NPO After: 7:30am   Lungs:  clear O2 Sat: 95% on room air. IV 0.9% NS with Angio Cath:  22g  IV Site: R Hand  IV Started by:  Annye Rusk, CNMT  Chest Size (in):  48 Cup Size: n/a  Height: 5\' 8"  (1.727 m)  Weight:  212 lb (96.163 kg)  BMI:  Body mass index is 32.24 kg/(m^2). Tech Comments:  Metoprolol  Taken at [employer] [facility] Med Study 1 or 2 day study: 1 day  Stress Test Type:  Treadmill/Lexiscan  Reading MD: n/a  Order Authorizing Provider:  Benjamin Stain  Resting Radionuclide: Technetium 67m Sestamibi  Resting Radionuclide Dose: 11.0 mCi   Stress Radionuclide:  Technetium 80m Sestamibi  Stress Radionuclide Dose: 33.0 mCi           Stress Protocol Rest HR: 61 Stress HR: 87  Rest BP: 117/81 Stress BP: 146/80  Exercise Time (min): 2:00 METS: 1.6   Predicted Max HR: 150 bpm % Max HR: 58 bpm Rate Pressure Product: 12702   Dose of Adenosine (mg):  n/a Dose of Lexiscan: 0.4 mg  Dose of Atropine (mg): n/a Dose of Dobutamine: n/a mcg/kg/min (at max HR)  Stress Test Technologist: Matilde Haymaker, RN  Nuclear Technologist:  Charlton Amor, CNMT     Rest Procedure:  Myocardial perfusion imaging was performed at rest 45 minutes following the intravenous administration of Technetium 8m Sestamibi. Rest ECG: NSR - Normal EKG  Stress  Procedure:  The patient received IV Lexiscan 0.4 mg over 15-seconds with concurrent low level exercise and then Technetium 54m Sestamibi was injected at 30-seconds while the patient continued walking one more minute.  Quantitative spect images were obtained after a 45-minute delay. Stress ECG: No significant change from baseline ECG  QPS Raw Data Images:  Normal; no motion artifact; normal heart/lung ratio. Stress Images:  Normal homogeneous uptake in all areas of the myocardium. Rest Images:  Normal homogeneous uptake in all areas of the myocardium. Subtraction (SDS):  Normal Transient Ischemic Dilatation (Normal <1.22):  1.02 Lung/Heart Ratio (Normal <0.45):  0.37  Quantitative Gated Spect Images QGS EDV:  90 ml QGS ESV:  31 ml  Impression Exercise Capacity:  Lexiscan with low level exercise. BP Response:  Normal blood pressure response. Clinical Symptoms:  Stomach Cramps ECG Impression:  No significant ST segment change suggestive of ischemia. Comparison with Prior Nuclear Study: No images to compare  Overall Impression:  Normal stress nuclear study.  LV Ejection Fraction: 65%.  LV Wall Motion:  NL LV Function; NL Wall Motion  Jenkins Rouge

## 2013-11-11 ENCOUNTER — Encounter: Payer: Self-pay | Admitting: Physician Assistant

## 2013-11-11 NOTE — Progress Notes (Signed)
Echocardiogram performed.  

## 2013-11-12 ENCOUNTER — Ambulatory Visit (INDEPENDENT_AMBULATORY_CARE_PROVIDER_SITE_OTHER): Payer: Medicare HMO | Admitting: Physician Assistant

## 2013-11-12 ENCOUNTER — Encounter: Payer: Self-pay | Admitting: Physician Assistant

## 2013-11-12 ENCOUNTER — Telehealth: Payer: Self-pay | Admitting: Cardiology

## 2013-11-12 ENCOUNTER — Encounter: Payer: Medicare Other | Admitting: Physician Assistant

## 2013-11-12 VITALS — BP 140/92 | HR 68 | Ht 68.0 in | Wt 214.3 lb

## 2013-11-12 DIAGNOSIS — E669 Obesity, unspecified: Secondary | ICD-10-CM

## 2013-11-12 DIAGNOSIS — I739 Peripheral vascular disease, unspecified: Secondary | ICD-10-CM | POA: Insufficient documentation

## 2013-11-12 DIAGNOSIS — I10 Essential (primary) hypertension: Secondary | ICD-10-CM

## 2013-11-12 DIAGNOSIS — E785 Hyperlipidemia, unspecified: Secondary | ICD-10-CM

## 2013-11-12 DIAGNOSIS — I2510 Atherosclerotic heart disease of native coronary artery without angina pectoris: Secondary | ICD-10-CM

## 2013-11-12 DIAGNOSIS — I4891 Unspecified atrial fibrillation: Secondary | ICD-10-CM

## 2013-11-12 NOTE — Telephone Encounter (Signed)
See note from today

## 2013-11-12 NOTE — Telephone Encounter (Signed)
New message     Pt needs tooth extracted next week---need instructions regarding stopping xarelto

## 2013-11-12 NOTE — Patient Instructions (Addendum)
Your physician recommends that you schedule a follow-up appointment in 1 month with the coumadin clinic @ the [facility] office.  There were no med changes today.  Your physician recommends that you schedule a follow-up appointment in: 3 months with Dr. Danella Deis.  You cannot stop your xarelto for any procedures until after april 7th. Then you can stop 2 days prior to having the dental work.

## 2013-11-12 NOTE — Telephone Encounter (Signed)
Will review with Dr Percival Spanish and call pt back with instructions.

## 2013-11-12 NOTE — Progress Notes (Addendum)
[place], [place], [place]  [address] Phone: [phone] Fax:  [phone]  Date:  11/12/2013   Patient ID:  Todd [name], DOB [birth year], MRN [id number]   PCP:  [name], MD  Cardiologist: Percival Spanish   History of Present Illness: Todd [name] is a 73 y.o. male with history of CAD s/p CABG 1999, HTN, HL, PVD (carotid disease, mild RECA/[place] dz by duplex 06/2013) who was recently admitted 3/16-3/17 with newly recognized AF-RVR manifested by palpitations and SOB. He was placed on IV heparin and diltiazem and spontaneously converted. He was started on Xarelto for stroke prophylaxis. Outpt echo was planned - done 11/10/13 with EF 55-60%, no RWMA, grade 1 diastolic dysfunction, doppler c/w LVEDP, no significant valvular disease. Nuc 11/04/13 done due to h/o CAD and new AF was normal.  He is doing great since discharge. No fatigue, palpitations, SOB, chest pain, orthopnea, weight changes, LEE. Compliant with meds. States home BP runs 117-120s/70s. DC BP on 3/17 was 114/57. His dentist planned for a tooth extraction next Tuesday and was wondering if he can come off Xarelto for this. He is not sure his dentist absolutely needs him to or not.  Recent Labs: 12/03/2012: TSH 1.54  07/30/2013: HDL Cholesterol 44.10; LDL (calc) 79  11/03/2013: ALT 25  11/04/2013: Creatinine 0.85; Hemoglobin 13.6; Potassium 4.3   Wt Readings from Last 3 Encounters:  11/12/13 214 lb 4.8 oz (97.206 kg)  11/10/13 212 lb (96.163 kg)  11/04/13 214 lb 12.8 oz (97.433 kg)     Past Medical History  Diagnosis Date  . HLD (hyperlipidemia)   . HTN (hypertension)   . Allergic rhinitis   . Nephrolithiasis   . PVD (peripheral vascular disease)     a. 1999; s/p LCEA. b. Duplex 06/2013: stable 40-59% RICA, patent LCEA 1-39%, mild RECA stenosis, mild bilat subclavian stenosis. Next due 06/2014.  . Stroke   . Asthma   . Diabetes mellitus   . CAD (coronary artery disease)     a. 1999; s/p CABG: LIMA to LAD,  SVG to OM1, SVG to OM2, SVG to RCA  b. Normal nuc 10/2013 (done because of new onset AF.)  . Atrial fibrillation with rapid ventricular response     a. newly diagnosed 11/03/13, spont conv to NSR, placed on xarelto.  Marland Kitchen Overweight     Current Outpatient Prescriptions  Medication Sig Dispense Refill  . aspirin 81 MG tablet Take 81 mg by mouth daily.        . Blood Glucose Monitoring Suppl (ACCU-CHEK NANO SMARTVIEW) W/DEVICE KIT 1 Device by Does not apply route as needed.  1 kit  0  . cetirizine (ZYRTEC) 10 MG tablet Take 10 mg by mouth as needed.        . Cholecalciferol (VITAMIN D-3) 5000 UNITS TABS Take 5,000 Units by mouth daily.       . fish oil-omega-3 fatty acids 1000 MG capsule Take 1 g by mouth. 2 by mouth in the am, 2 by mouth in the pm      . glucose blood test strip Use as instructed  100 each  12  . Lancets (ACCU-CHEK SOFT TOUCH) lancets Use as instructed  100 each  12  . metFORMIN (GLUCOPHAGE-XR) 500 MG 24 hr tablet Take 500 mg by mouth 2 (two) times daily.      . metoprolol (LOPRESSOR) 50 MG tablet Take 1 tablet (50 mg total) by mouth 2 (two) times daily.  180 tablet  3  . Multiple Vitamin (MULTIVITAMIN) tablet Take 1 tablet by mouth daily.        . niacin (NIASPAN) 1000 MG CR tablet Take 1 tablet (1,000 mg total) by mouth at bedtime.  90 tablet  3  . nitroGLYCERIN (NITROSTAT) 0.4 MG SL tablet Place 1 tablet (0.4 mg total) under the tongue every 5 (five) minutes as needed for chest pain.  25 tablet  6  . omeprazole (PRILOSEC) 20 MG capsule Take 1 capsule (20 mg total) by mouth 2 (two) times daily before a meal.  180 capsule  3  . polycarbophil (FIBERCON) 625 MG tablet Take 625 mg by mouth. 2 by mouth in am and 2 by mouth in the pm      . Rivaroxaban (XARELTO) 20 MG TABS tablet Take 1 tablet (20 mg total) by mouth daily with supper.  30 tablet  0  . simvastatin (ZOCOR) 40 MG tablet Take 1 tablet (40 mg total) by mouth at bedtime.  90 tablet  3   No current facility-administered  medications for this visit.    Allergies:   Amlodipine besylate and Ivp dye   Social History:  The patient  reports that he has never smoked. He has never used smokeless tobacco. He reports that he does not drink alcohol or use illicit drugs.   Family History:  The patient's family history includes Asthma in his sister; Colon cancer (age of onset: 46) in his brother; Diabetes in his father; Heart attack (age of onset: 42) in his brother; Heart attack (age of onset: 21) in his brother and mother; Hemochromatosis in his brother; Prostate cancer in his brother; Stroke (age of onset: 65) in his father.   ROS:  Please see the history of present illness.   All other systems reviewed and negative.   PHYSICAL EXAM:  VS:  BP 140/92  Pulse 68  Ht 5' 8"  (1.727 m)  Wt 214 lb 4.8 oz (97.206 kg)  BMI 32.59 kg/m2 Well nourished, well developed WM, in no acute distress HEENT: normal Neck: no JVD Cardiac:  normal S1, S2; RRR; no murmur Lungs:  clear to auscultation bilaterally, no wheezing, rhonchi or rales Abd: soft, nontender, no hepatomegaly Ext: no edema Skin: warm and dry Neuro:  moves all extremities spontaneously, no focal abnormalities noted  EKG:  NSR 68bpm no actue ST-T changes, 1st degree AVB PR - 236     ASSESSMENT AND PLAN:  1. Atrial fibrillation, newly diagnosed this month, spontaneously converted in hospital - maintaining NSR. Continue current regimen including metoprolol and Xarelto. With regards to tooth extraction, discussed with Dr. Gwenlyn Found. We would prefer that he remain on anticoagulation for at least 3 weeks following his spontaneous cardioversion to NSR (3/17) which puts [place] at after April 7th. He may then hold Xarelto 2 days before procedure and restart as soon as dentist feels is safe. He is not sure he will even need to come off it, so I reiterated that unless he absolutely has to, he should not stop Xarelto. Will plug him into anticoag clinic for periodic safety  monitoring. 2. CAD s/p prior CABG, normal nuc 11/04/13 - stable. 3. HTN - slightly elevated in office but pt reports recent BPs normal at home and in hospital. Continue current regimen. Would not push BB further at this time given 1st degree AVB. Echocardiogram suggestive of mild diastolic dysfunction but he is not manifesting any signs of heart failure at present time. We reviewed symptoms and stressed importance of BP  control and salt restriction. 4. Hyperlipidemia - continue statin. 5. PVD with carotid disease, mild subclavian/RECA by duplex 06/2013 - due for followup 06/2014 6. Obesity Body mass index is 32.59 kg/(m^2). - he expressed a desire to lose weight. Continued physical activity encouraged.  Dispo: F/u [place] 3 months   Signed, Melina Copa, PA-C  11/12/2013 1:17 PM   Addendum: sent chart to Dr. Percival Spanish to determine if patient should remain on aspirin given Xarelto. He feels the patient can stop the aspirin and said he will send a message to his nurse to contact the patient. Dayna Dunn PA-C

## 2013-11-12 NOTE — Progress Notes (Signed)
  This encounter was created in error - please disregard. Pt r/s to NL not [employer].

## 2013-11-13 ENCOUNTER — Telehealth: Payer: Self-pay | Admitting: Cardiology

## 2013-11-13 NOTE — Telephone Encounter (Signed)
New message     Pt has an appt on wed -4-1 for an extraction.  He is on xarelto '20mg'$  daily.  Should he stop it for the extraction?  OK to leave msg on vm if after hours

## 2013-11-13 NOTE — Telephone Encounter (Signed)
Jocelyn Lamer aware pt has been instructed twice now to hold Xarelto 2 days prior to the extraction.  She will call the pt again to make sure he understands.

## 2013-11-13 NOTE — Telephone Encounter (Signed)
Left message for pt and wife of instructions included in Dana's note.  Requested they call back with any questions.

## 2013-11-14 ENCOUNTER — Telehealth: Payer: Self-pay | Admitting: *Deleted

## 2013-11-14 NOTE — Telephone Encounter (Signed)
Wife aware to stop ASA

## 2013-11-14 NOTE — Telephone Encounter (Signed)
Message copied by Shellia Cleverly on Fri Nov 14, 2013 12:50 PM ------      Message from: Minus Breeding      Created: Thu Nov 13, 2013  6:28 PM       No.  Stop the ASA.  Thanks.  I will send the message to [facility].        ----- Message -----         From: Charlie Pitter, PA-C         Sent: 11/12/2013   1:28 PM           To: Minus Breeding, MD            Saw a pt of yours in followup today. Newly on Xarelto for AF. H/o CAD, had a recent negative nuc this month. Do you usually have your patients continue on baby aspirin as well? He is on both.      Thanks,      Dayna       ------

## 2013-11-20 ENCOUNTER — Encounter: Payer: Self-pay | Admitting: Internal Medicine

## 2013-11-20 ENCOUNTER — Other Ambulatory Visit: Payer: Self-pay | Admitting: Cardiovascular Disease

## 2013-11-20 DIAGNOSIS — I4891 Unspecified atrial fibrillation: Secondary | ICD-10-CM

## 2013-12-05 ENCOUNTER — Other Ambulatory Visit (INDEPENDENT_AMBULATORY_CARE_PROVIDER_SITE_OTHER): Payer: Commercial Managed Care - HMO

## 2013-12-05 ENCOUNTER — Ambulatory Visit (INDEPENDENT_AMBULATORY_CARE_PROVIDER_SITE_OTHER): Payer: Commercial Managed Care - HMO | Admitting: Internal Medicine

## 2013-12-05 ENCOUNTER — Encounter: Payer: Self-pay | Admitting: Internal Medicine

## 2013-12-05 ENCOUNTER — Other Ambulatory Visit: Payer: Medicare Other

## 2013-12-05 VITALS — BP 144/108 | HR 77 | Temp 97.8°F | Ht 68.0 in | Wt 212.2 lb

## 2013-12-05 DIAGNOSIS — E785 Hyperlipidemia, unspecified: Secondary | ICD-10-CM

## 2013-12-05 DIAGNOSIS — E1159 Type 2 diabetes mellitus with other circulatory complications: Secondary | ICD-10-CM

## 2013-12-05 DIAGNOSIS — E1151 Type 2 diabetes mellitus with diabetic peripheral angiopathy without gangrene: Secondary | ICD-10-CM | POA: Insufficient documentation

## 2013-12-05 DIAGNOSIS — I10 Essential (primary) hypertension: Secondary | ICD-10-CM

## 2013-12-05 DIAGNOSIS — E119 Type 2 diabetes mellitus without complications: Secondary | ICD-10-CM

## 2013-12-05 LAB — TSH: TSH: 3.14 u[IU]/mL (ref 0.35–5.50)

## 2013-12-05 LAB — MICROALBUMIN / CREATININE URINE RATIO
Creatinine,U: 254.7 mg/dL
Microalb Creat Ratio: 0.7 mg/g (ref 0.0–30.0)
Microalb, Ur: 1.7 mg/dL (ref 0.0–1.9)

## 2013-12-05 LAB — HEMOGLOBIN A1C: Hgb A1c MFr Bld: 6.7 % — ABNORMAL HIGH (ref 4.6–6.5)

## 2013-12-05 NOTE — Progress Notes (Signed)
Pre visit review using our clinic review tool, if applicable. No additional management support is needed unless otherwise documented below in the visit note. 

## 2013-12-05 NOTE — Progress Notes (Signed)
   Subjective:    Patient ID: Todd [name], male    DOB: 19-Mar-1941, 73 y.o.   MRN: [id number]  HPI He is here to assess active health issues & conditions. PMH, FH, & Social history verified & updated   HYPERTENSION: Disease Monitoring: Blood pressure  average :116/72 Medication Compliance:yes  Diabetes :  FBS range/average: 106-145 Highest 2 hr post meal glucose:no monitor Medication compliance:yes Hypoglycemia:no Ophthamology care:UTD Podiatry care: no  HYPERLIPIDEMIA: Disease Monitoring:lipids @ goal 12/14 Medication Compliance:yes     Review of Systems  Chest pain, palpitations:no but AF with RVR recently       Dyspnea:no Edema:no Claudication: no Lightheadedness,Syncope:no Weight gain/loss:slow loss purposefully Polyuria/phagia/dipsia:  no    Blurred vision /diplopia/lossof vision:no Limb numbness/tingling/burning:no Non healing skin lesions:no Abd pain, bowel changes: no  Myalgias: no Memory loss:no       Objective:   Physical Exam  Gen.: Healthy and well-nourished in appearance. Alert, appropriate and cooperative throughout exam. Appears younger than stated age  Head: Normocephalic without obvious abnormalities;  no alopecia  Eyes: No corneal or conjunctival inflammation noted. Pupils equal round reactive to light and accommodation. Extraocular motion intact.  Ears: External  ear exam reveals no significant lesions or deformities. Canals clear .TMs normal. Hearing is grossly decreased bilaterally. Nose: External nasal exam reveals no deformity or inflammation. Nasal mucosa are pink and moist. No lesions or exudates noted.   Mouth: Oral mucosa and oropharynx reveal no lesions or exudates. Teeth in good repair. Neck: No deformities, masses, or tenderness noted. Range of motion &. Thyroid normal. Lungs: Normal respiratory effort; chest expands symmetrically. Lungs are clear to auscultation without rales, wheezes, or increased work of breathing. Heart: Normal  rate and rhythm. Normal S1 and S2. No gallop, click, or rub. No murmur. Abdomen: Bowel sounds normal; abdomen soft and nontender. No masses, organomegaly or hernias noted. Genitalia: deferred to Dr Tresa Moore                               Musculoskeletal/extremities:There is some asymmetry of the posterior thoracic musculature suggesting occult scoliosis. No clubbing, cyanosis, edema, or significant extremity  deformity noted. Range of motion normal .Tone & strength normal. Hand joints normal Fingernail / toenail health good. Able to lie down & sit up w/o help. Negative SLR bilaterally Vascular: Carotid, radial artery, dorsalis pedis and  posterior tibial pulses are full and equal. No bruits present. Neurologic: Alert and oriented x3. Deep tendon reflexes symmetrical and normal.  Gait normal       Skin: Intact without suspicious lesions or rashes. Lymph: No cervical, axillary lymphadenopathy present. Psych: Mood and affect are normal. Normally interactive                                                                                        Assessment & Plan:  See Current Assessment & Plan in Problem List under specific Diagnosis

## 2013-12-05 NOTE — Patient Instructions (Signed)
Your next office appointment will be determined based upon review of your pending labs. Those instructions will be transmitted to you through My Chart . 

## 2013-12-06 ENCOUNTER — Other Ambulatory Visit: Payer: Self-pay | Admitting: Internal Medicine

## 2013-12-06 DIAGNOSIS — E1159 Type 2 diabetes mellitus with other circulatory complications: Secondary | ICD-10-CM

## 2013-12-06 NOTE — Assessment & Plan Note (Signed)
A1c & urine microalbumin Continue TLC with purposeful weight loss

## 2013-12-06 NOTE — Assessment & Plan Note (Signed)
Lipids up to date Check TSH

## 2013-12-06 NOTE — Assessment & Plan Note (Signed)
BP goals discussed 

## 2013-12-08 ENCOUNTER — Ambulatory Visit (INDEPENDENT_AMBULATORY_CARE_PROVIDER_SITE_OTHER): Payer: Commercial Managed Care - HMO | Admitting: *Deleted

## 2013-12-08 DIAGNOSIS — I4891 Unspecified atrial fibrillation: Secondary | ICD-10-CM

## 2013-12-08 LAB — BASIC METABOLIC PANEL
BUN: 17 mg/dL (ref 6–23)
CO2: 26 mEq/L (ref 19–32)
Calcium: 9.2 mg/dL (ref 8.4–10.5)
Chloride: 106 mEq/L (ref 96–112)
Creatinine, Ser: 0.8 mg/dL (ref 0.4–1.5)
GFR: 98.48 mL/min (ref >60.00)
Glucose, Bld: 132 mg/dL — ABNORMAL HIGH (ref 70–99)
Potassium: 3.8 mEq/L (ref 3.5–5.1)
Sodium: 140 mEq/L (ref 135–145)

## 2013-12-08 LAB — CBC
HCT: 39.2 % (ref 39.0–52.0)
Hemoglobin: 13.5 g/dL (ref 13.0–17.0)
MCHC: 34.4 g/dL (ref 30.0–36.0)
MCV: 99.2 fl (ref 78.0–100.0)
Platelets: 140 10*3/uL — ABNORMAL LOW (ref 150.0–400.0)
RBC: 3.95 Mil/uL — ABNORMAL LOW (ref 4.22–5.81)
RDW: 14.2 % (ref 11.5–14.6)
WBC: 5 10*3/uL (ref 4.5–10.5)

## 2013-12-08 NOTE — Progress Notes (Signed)
Pt was started on Xarelto 20 mg daily  for Atrial Fib on March 17,2015.    Reviewed patients medication list.  Pt is not currently on any combined P-gp and strong CYP3A4 inhibitors/inducers (ketoconazole, traconazole, ritonavir, carbamazepine, phenytoin, rifampin, St. John's wort).  Reviewed labs.  SrCr 0.8 Weight 96.87kg , CrCl 117.72- .  Dose is  appropriate based on CrCl.   Hgb 13.5 and HCT 39.2  A full discussion of the nature of anticoagulants has been carried out.  A benefit/risk analysis has been presented to the patient, so that they understand the justification for choosing anticoagulation with Xarelto at this time.  The need for compliance is stressed.  Pt is aware to take the medication once daily with the largest meal of the day.  Side effects of potential bleeding are discussed, including unusual colored urine or stools, coughing up blood or coffee ground emesis, nose bleeds or serious fall or head trauma.  Discussed signs and symptoms of stroke. The patient should avoid any OTC items containing aspirin or ibuprofen.  Avoid alcohol consumption.   Call if any signs of abnormal bleeding.  Discussed financial obligations and pt is not having any  difficulty in obtaining medication.  Next lab test in 6 months.  Pt states he has been in NSR since episode in March and wants to talk with Dr Percival Spanish in June  regarding possibility coming off Xarelto but will stay on Xarelto until then. Pt states did have tooth extracted on April 1st and did hold Xarelto for 2 days prior to extraction and did go back on Xarelto day of extraction. Pt states has not had any  problems in taking Xarelto has not seen in bleeding or had any signs or symptoms of stroke. Called and spoke with pt's wife and gave results of Hgb and Hct and SrCr and informed that pt is on the correct dose of Xarelto according to labs and appt made for 6 months checkup if he remains on Xarelto and pt's wife states understanding.

## 2013-12-19 ENCOUNTER — Encounter: Payer: Self-pay | Admitting: Internal Medicine

## 2013-12-19 ENCOUNTER — Telehealth: Payer: Self-pay | Admitting: Internal Medicine

## 2013-12-19 ENCOUNTER — Ambulatory Visit (INDEPENDENT_AMBULATORY_CARE_PROVIDER_SITE_OTHER): Payer: Commercial Managed Care - HMO | Admitting: Internal Medicine

## 2013-12-19 VITALS — BP 126/70 | HR 84 | Temp 100.0°F | Resp 13 | Wt 214.0 lb

## 2013-12-19 DIAGNOSIS — R059 Cough, unspecified: Secondary | ICD-10-CM

## 2013-12-19 DIAGNOSIS — R05 Cough: Secondary | ICD-10-CM

## 2013-12-19 DIAGNOSIS — J309 Allergic rhinitis, unspecified: Secondary | ICD-10-CM

## 2013-12-19 MED ORDER — AZITHROMYCIN 250 MG PO TABS: ORAL_TABLET | ORAL | Status: DC

## 2013-12-19 MED ORDER — HYDROCODONE-HOMATROPINE 5-1.5 MG/5ML PO SYRP: 5.0000 mL | ORAL_SOLUTION | Freq: Four times a day (QID) | ORAL | Status: DC | PRN

## 2013-12-19 NOTE — Progress Notes (Signed)
   Subjective:    Patient ID: Todd [name], male    DOB: 11/22/1940, 73 y.o.   MRN: [id number]  HPI  Symptoms began Tuesday as clear rhinitis, sore throat, with dry cough. Itchy, watery eyes.  Reports low-grade fevers and chills since Wednesday.  He reports he is sore from coughing. Cough is keeping him up at night.   His wife was treated with Z-Pack and Hydromet last week.   Review of Systems Denies headache, maxillary and dental pain, chest sputum, ear pain or ear discharge.    Objective:   Physical Exam General appearance:good health ;well nourished; no acute distress or increased work of breathing is present.  No  lymphadenopathy about the head, neck, or axilla noted.   Eyes: No conjunctival inflammation or lid edema is present. There is no scleral icterus.  Ears:  External ear exam shows no significant lesions or deformities.  Otoscopic examination reveals clear canals, tympanic membranes are intact bilaterally without bulging, retraction, inflammation or discharge.  Nose:  External nasal examination shows nasal polyps bilaterally. Nasal mucosa are pink and moist without lesions or exudates. No septal dislocation or deviation.No obstruction to airflow. Hyponasal speech.   Oral exam: Dental hygiene is good; lips and gums are healthy appearing.There is no oropharyngeal erythema or exudate noted.   Neck:  No deformities, thyromegaly, masses, or tenderness noted.  Supple with full range of motion without pain.   Heart:  Normal rate and regular rhythm. S1 and S2 normal without gallop, murmur, click, rub or other extra sounds.   Lungs:Chest clear to auscultation; no wheezes, rhonchi,rales ,or rubs present.No increased work of breathing.    Extremities:  No cyanosis, edema, or clubbing noted.   Skin: Warm & dry w/o jaundice or tenting.      Assessment & Plan:  #1 rhinitis - nasal cleansing program, hydromet, Aug with watchful waiting ? #2 GERD - change to Ranitidine

## 2013-12-19 NOTE — Progress Notes (Signed)
Pre visit review using our clinic review tool, if applicable. No additional management support is needed unless otherwise documented below in the visit note. 

## 2013-12-19 NOTE — Patient Instructions (Signed)
Plain Mucinex (NOT D) for thick secretions ;force NON dairy fluids .   Nasal cleansing in the shower as discussed with lather of mild shampoo.After 10 seconds wash off lather while  exhaling through nostrils. Make sure that all residual soap is removed to prevent irritation.  Flonase OR Nasacort AQ 1 spray in each nostril twice a day as needed. Use the "crossover" technique into opposite nostril spraying toward opposite ear @ 45 degree angle, not straight up into nostril.  Use a Neti pot daily only  as needed for significant sinus congestion; going from open side to congested side . Plain Allegra (NOT D )  160 daily , Loratidine 10 mg , OR Zyrtec 10 mg @ bedtime  as needed for itchy eyes & sneezing.  Fill the  prescription for Zithromax it there is not dramatic improvement in the next 48-72 hours. 

## 2013-12-19 NOTE — Progress Notes (Signed)
   Subjective:    Patient ID: Todd [name], male    DOB: August 12, 1941, 73 y.o.   MRN: [id number]  HPI Symptoms began Tuesday as clear rhinitis, sore throat, with dry cough. Itchy, watery eyes.  Reports low-grade fevers and chills since Wednesday.  He reports he is sore from coughing. Cough is keeping him up at night.  His wife was treated with Z-Pack and Hydromet last week.    Review of Systems Denies headache, maxillary and dental pain, chest sputum, ear pain or ear discharge.     Objective:   Physical Exam General appearance:good health ;well nourished; no acute distress or increased work of breathing is present. No lymphadenopathy about the head, neck, or axilla noted.  Eyes: No conjunctival inflammation or lid edema is present. There is no scleral icterus.  Ears: External ear exam shows no significant lesions or deformities. Otoscopic examination reveals clear canals, tympanic membranes are intact bilaterally without bulging, retraction, inflammation or discharge.  Nose: External nasal examination shows no deformity or inflammation. Nasal mucosa are pink and moist without lesions or exudates. No septal dislocation or deviation.No obstruction to airflow. Hyponasal speech.  Oral exam: Dental hygiene is good; lips and gums are healthy appearing.There is no oropharyngeal erythema or exudate noted.  Neck: No deformities, thyromegaly, masses, or tenderness noted. Supple with full range of motion without pain.  Heart: Normal rate and regular rhythm. S1 and S2 normal without gallop, murmur, click, rub or other extra sounds.  Lungs:Chest clear to auscultation; no wheezes, rhonchi,rales ,or rubs present.No increased work of breathing.  Extremities: No cyanosis, edema, or clubbing noted  Skin: Warm & dry w/o jaundice or tenting.      Assessment & Plan:  #1 rhinitis - nasal cleansing program, Hydromet.   with watchful waiting. Zpack if no better

## 2013-12-19 NOTE — Telephone Encounter (Signed)
Patient Information:  Caller Name: [name]  Phone: [phone]  Patient: Todd [name], Todd [name]  Gender: Male  DOB: 1940/12/25  Age: 73 Years  PCP: [name]  Office Follow Up:  Does the office need to follow up with this patient?: Yes  Instructions For The Office: Please review, asking for medicine to be called in since he can't see Dr Linna Darner. Can reach caller/.Judy at  [phone]  RN Note:  Had called office and Dr Linna Darner did not have appointment available and says patient does not see anyone else.  Symptoms  Reason For Call & Symptoms: Cold symptoms, runny nose and slight cough started Wed 12/17/2013.  Cough worse yesterday 4/30 afternoon with temp 100.4 and fever continues today at 99.4.  Wife says bad cough similar to the once she had last week that required antibiotic.Marland Kitchen   Feels he should be seen or treated without office visit due to symptoms.  Reviewed Health History In EMR: Yes  Reviewed Medications In EMR: Yes  Reviewed Allergies In EMR: Yes  Reviewed Surgeries / Procedures: Yes  Date of Onset of Symptoms: 12/17/2013  Treatments Tried: Coricidin HBP with DM and Guaifenesin  Treatments Tried Worked: No  Any Fever: Yes  Fever Taken: Oral  Fever Time Of Reading: 08:20:00  Fever Last Reading: 99.4  Guideline(s) Used:  Cough  Disposition Per Guideline:   See Today or Tomorrow in Office  Reason For Disposition Reached:   Continuous (nonstop) coughing interferes with work or school and no improvement using cough treatment per Care Advice  Advice Given:  N/A  Patient Refused Recommendation:  Patient Requests Prescription  Wife asking for Rx to be called to [facility] on [address] [phone].

## 2014-02-09 ENCOUNTER — Encounter: Payer: Self-pay | Admitting: Cardiology

## 2014-02-09 ENCOUNTER — Ambulatory Visit (INDEPENDENT_AMBULATORY_CARE_PROVIDER_SITE_OTHER): Payer: Commercial Managed Care - HMO | Admitting: Cardiology

## 2014-02-09 VITALS — BP 124/78 | HR 63 | Ht 68.0 in | Wt 214.0 lb

## 2014-02-09 DIAGNOSIS — I10 Essential (primary) hypertension: Secondary | ICD-10-CM

## 2014-02-09 DIAGNOSIS — I2510 Atherosclerotic heart disease of native coronary artery without angina pectoris: Secondary | ICD-10-CM

## 2014-02-09 DIAGNOSIS — I4891 Unspecified atrial fibrillation: Secondary | ICD-10-CM

## 2014-02-09 DIAGNOSIS — I6529 Occlusion and stenosis of unspecified carotid artery: Secondary | ICD-10-CM

## 2014-02-09 NOTE — Patient Instructions (Signed)
The current medical regimen is effective;  continue present plan and medications.  Follow up in 6 months with Dr Percival Spanish at the [facility] office.  You will receive a letter in the mail 2 months before you are due.  Please call us when you receive this letter to schedule your follow up appointment.

## 2014-02-09 NOTE — Progress Notes (Signed)
HPI The patient presents for follow up of CAD.  He also had atrial fibrillation and was hospitalized briefly earlier this year. He converted spontaneously to sinus rhythm. He was treated with Xarelto.  He did have an echocardiogram that demonstrated well-preserved ejection fraction. In March she had a stress perfusion study demonstrating no evidence of ischemia or infarct with preserved ejection fraction.  Since we last saw him he has done well. The patient denies any new symptoms such as chest discomfort, neck or arm discomfort. There has been no new shortness of breath, PND or orthopnea. There have been no reported palpitations, presyncope or syncope.  He is not walking as much as I would like.    Allergies  Allergen Reactions  . Amlodipine Besylate     Rash Because of a history of documented adverse serious drug reaction;Medi Alert bracelet  is recommended  . Ivp Dye [Iodinated Diagnostic Agents]     Rash Because of a history of documented adverse serious drug reaction;Medi Alert bracelet  is recommended    Current Outpatient Prescriptions  Medication Sig Dispense Refill  . Blood Glucose Monitoring Suppl (ACCU-CHEK NANO SMARTVIEW) W/DEVICE KIT 1 Device by Does not apply route as needed.  1 kit  0  . cetirizine (ZYRTEC) 10 MG tablet Take 10 mg by mouth as needed.        . Cholecalciferol (VITAMIN D-3) 5000 UNITS TABS Take 5,000 Units by mouth daily.       . fish oil-omega-3 fatty acids 1000 MG capsule Take 1 g by mouth. 2 by mouth in the am, 2 by mouth in the pm      . glucose blood test strip Use as instructed  100 each  12  . Lancets (ACCU-CHEK SOFT TOUCH) lancets Use as instructed  100 each  12  . metFORMIN (GLUCOPHAGE-XR) 500 MG 24 hr tablet Take 500 mg by mouth 2 (two) times daily.      . metoprolol (LOPRESSOR) 50 MG tablet Take 1 tablet (50 mg total) by mouth 2 (two) times daily.  180 tablet  3  . Multiple Vitamin (MULTIVITAMIN) tablet Take 1 tablet by mouth daily.        .  niacin (NIASPAN) 1000 MG CR tablet Take 1 tablet (1,000 mg total) by mouth at bedtime.  90 tablet  3  . nitroGLYCERIN (NITROSTAT) 0.4 MG SL tablet Place 1 tablet (0.4 mg total) under the tongue every 5 (five) minutes as needed for chest pain.  25 tablet  6  . omeprazole (PRILOSEC) 20 MG capsule Take 1 capsule (20 mg total) by mouth 2 (two) times daily before a meal.  180 capsule  3  . polycarbophil (FIBERCON) 625 MG tablet Take 625 mg by mouth. 2 by mouth in am and 2 by mouth in the pm      . Rivaroxaban (XARELTO) 20 MG TABS tablet Take 1 tablet (20 mg total) by mouth daily with supper.  30 tablet  0  . simvastatin (ZOCOR) 40 MG tablet Take 1 tablet (40 mg total) by mouth at bedtime.  90 tablet  3   No current facility-administered medications for this visit.    Past Medical History  Diagnosis Date  . HLD (hyperlipidemia)   . HTN (hypertension)   . Allergic rhinitis   . Nephrolithiasis   . PVD (peripheral vascular disease)     a. 1999; s/p LCEA. b. Duplex 06/2013: stable 40-59% RICA, patent LCEA 1-39%, mild RECA stenosis, mild bilat subclavian stenosis. Next due  06/2014.  . Stroke   . Asthma   . Diabetes mellitus   . CAD (coronary artery disease)     a. 1999; s/p CABG: LIMA to LAD, SVG to OM1, SVG to OM2, SVG to RCA  b. Normal nuc 10/2013 (done because of new onset AF.)  . Atrial fibrillation with rapid ventricular response     a. newly diagnosed 11/03/13, spont conv to NSR, placed on xarelto.  Marland Kitchen Overweight(278.02)     Past Surgical History  Procedure Laterality Date  . Colonoscopy w/ polypectomy  2009    Dr Sharlett Iles  . 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 Carotid endarterectomy  1999  . Nasal sinus surgery    . Colonoscopy  2014    negative    ROS:  As stated in the HPI and negative for all other systems.  PHYSICAL EXAM BP 124/78  Pulse 63  Ht 5' 8"  (1.727 m)  Wt 214 lb (97.07 kg)  BMI 32.55 kg/m2 GENERAL:  Well appearing HEENT:  Pupils equal round and reactive,  fundi not visualized, oral mucosa unremarkable NECK:  No jugular venous distention, waveform within normal limits, carotid upstroke brisk and symmetric, no bruits, no thyromegaly LUNGS:  Clear to auscultation bilaterally CHEST:  Well healed sternotomy scar. HEART:  PMI not displaced or sustained,S1 and S2 within normal limits, no S3, no S4, no clicks, no rubs, no murmurs ABD:  Flat, positive bowel sounds normal in frequency in pitch, no bruits, no rebound, no guarding, no midline pulsatile mass, no hepatomegaly, no splenomegaly EXT:  2 plus pulses throughout, no edema, no cyanosis no clubbing  EKG:  Sinus rhythm, rate 71, axis within normal limits, intervals within normal limits, no acute ST-T wave changes.   02/09/2014  ASSESSMENT AND PLAN  ATRIAL FIB - The patient had had no symptomatic recurrence of this. However, given his Mr. Joey Lierman Persley has a CHA2DS2 - VASc score of 6 he will remain on anticoagulatoin. .  C A D -  The patient has no new sypmtoms since stress testing last year.  No further cardiovascular testing is indicated. We will continue with aggressive risk reduction and meds as listed.   CAROTID ARTERY DISEASE -  He had follow up today with stable plaque and will be followed again in Dec.  HYPERLIPIDEMIA -  This was excellent.  We had a long discussion about niacin.  At this point although the data on population basis known support significant reductions in cardiovascular events with this medication he has tolerated this for years and has an absolutely perfect lipid profile. Therefore, I would continue it.  OVERWEIGHT/OBESITY -  He understands the need to lose weight and we have talked about this in the past.   We discussed this at length again today.  HYPERTENSION - The blood pressure is at target. No change in medications is indicated. We will continue with therapeutic lifestyle changes (TLC).;

## 2014-04-22 ENCOUNTER — Telehealth: Payer: Self-pay | Admitting: Internal Medicine

## 2014-04-22 NOTE — Telephone Encounter (Signed)
Patient Information:  Caller Name: [name]  Phone: [phone]  Patient: Todd [name], Todd [name]  Gender: Male  DOB: 1941-05-14  Age: 73 Years  PCP: [name]  Office Follow Up:  Does the office need to follow up with this patient?: Yes  Instructions For The Office: Please see RN note.  RN Note:  Intermittent Tingling, onset 8-26.  Pt started Xarelto in March 2015, new Afib dx and hx of DM.  Pt has had intermittent generalized Upper Body Tingling w/ bright red Skin on 8-26, 8-31 and 9-1.  Pt is currently asymptomatic. Wife denies one sided Tingling, Confusion or Slurred Speech, Pt is at morning [employer] at triage.  Pt's fasting FSBS has been in 120ies.  Pt has f/u appt in October for Xarelto levels.  Pt needs f/u DM blood work appt.  Please call Wife back to set up appt for blood work and evaluation.  Symptoms  Reason For Call & Symptoms: Intermittent Tingling, onset 8-26  Reviewed Health History In EMR: Yes  Reviewed Medications In EMR: Yes  Reviewed Allergies In EMR: Yes  Reviewed Surgeries / Procedures: Yes  Date of Onset of Symptoms: 04/15/2014  Guideline(s) Used:  No Protocol Available - Information Only  No Protocol Available - Sick Adult  Disposition Per Guideline:   See Today or Tomorrow in Office  Reason For Disposition Reached:   Nursing judgment  Advice Given:  N/A  Patient Will Follow Care Advice:  YES

## 2014-04-24 ENCOUNTER — Ambulatory Visit (INDEPENDENT_AMBULATORY_CARE_PROVIDER_SITE_OTHER): Payer: Commercial Managed Care - HMO | Admitting: Internal Medicine

## 2014-04-24 ENCOUNTER — Encounter: Payer: Self-pay | Admitting: Internal Medicine

## 2014-04-24 VITALS — BP 132/84 | HR 73 | Temp 98.0°F | Wt 205.4 lb

## 2014-04-24 DIAGNOSIS — R209 Unspecified disturbances of skin sensation: Secondary | ICD-10-CM

## 2014-04-24 DIAGNOSIS — R202 Paresthesia of skin: Secondary | ICD-10-CM

## 2014-04-24 DIAGNOSIS — R232 Flushing: Secondary | ICD-10-CM

## 2014-04-24 NOTE — Progress Notes (Signed)
Pre visit review using our clinic review tool, if applicable. No additional management support is needed unless otherwise documented below in the visit note. 

## 2014-04-24 NOTE — Patient Instructions (Signed)
HDL goal is > 40 in men & > 50 in women.Interventions to raise HDL or GOOD cholesterol include: exercising 30-45 minutes 3-4 X per week ; dietary sources of Omega 3 fatty acids ( salmon & tuna) or  supplementing with Flax seed  1-2 grams per day. The B vitamin Niacin also raises HDL but has a vasodilating effect which may cause flushing.Also raising HDL with Niacin has not been shown to decrease heart attack or stroke risks in extensive trials ( FYI:the Study was called [place])

## 2014-04-24 NOTE — Progress Notes (Signed)
   Subjective:    Patient ID: Todd [name], male    DOB: 06/25/1941, 73 y.o.   MRN: [id number]  HPI He describes "hard tingling" involving his entire body except for the knees down on 2 occasions in the last several weeks. This is associated with frank red flushing of his entire body as well.  Typically this has occurred between 9 & 10 PM  He describes similar  tingling almost daily since Xarelto was initiated in March of this year. He had a similar symptoms with Niaspan in the past  He takes Xarelto between 4 and 5 PM. Niaspan is taken after dinner between 7 and 8 PM.  His wife has been decreasing his Niaspan to every other day.     Review of Systems  He specifically denies associated headache, chest pain, or diarrhea.   He denies any active cardiopulmonary symptoms and is very physically active. He works multiple hours in the heat.       Objective:   Physical Exam  Positive or pertinent findings include: He has intermittent excessive blinking. His BMI is 31.23 which would be compatible with mild obesity.  General appearance :adequately nourished; in no distress. Eyes: No conjunctival inflammation or scleral icterus is present. Oral exam: Lips and gums are healthy appearing.There is no oropharyngeal erythema or exudate noted.  Heart:  Normal rate and regular rhythm. S1 and S2 normal without gallop, murmur, click, rub or other extra sounds   Lungs:Chest clear to auscultation; no wheezes, rhonchi,rales ,or rubs present.No increased work of breathing.  Abdomen: bowel sounds normal, soft and non-tender without masses, organomegaly or hernias noted.  No guarding or rebound.  Skin:Warm & dry.  Intact without suspicious lesions or rashes ; no jaundice or tenting Lymphatic: No lymphadenopathy is noted about the head, neck, axilla              Assessment & Plan:  #1 paroxysmal flushing with associated paresthesias. The most likely etiology would be the Niaspan.  I  recommend a trial off this medicine to see if this paroxysmal pattern resolves.

## 2014-04-30 ENCOUNTER — Telehealth: Payer: Self-pay | Admitting: Internal Medicine

## 2014-04-30 NOTE — Telephone Encounter (Signed)
Patients wife called back.  She wanted to know if patient needs a 6 month fu from April visit to check A1C.  Looked at appt notes and notes stated next appt would be determined off of labs.  Looks at labs in April.  There was no note to come back.

## 2014-04-30 NOTE — Telephone Encounter (Signed)
Order written 4/18

## 2014-04-30 NOTE — Telephone Encounter (Signed)
I spoke to patient wife and let her know the order for patient's A1c to be checked is in. I gave her labs hours.

## 2014-05-18 ENCOUNTER — Other Ambulatory Visit: Payer: Self-pay | Admitting: *Deleted

## 2014-05-18 ENCOUNTER — Telehealth: Payer: Self-pay | Admitting: *Deleted

## 2014-05-18 ENCOUNTER — Telehealth: Payer: Self-pay | Admitting: Internal Medicine

## 2014-05-18 NOTE — Telephone Encounter (Signed)
[facility]  Does he need [facility] referral?

## 2014-05-18 NOTE — Telephone Encounter (Signed)
Pt does not need referral. Pt spouse made aware.

## 2014-05-18 NOTE — Telephone Encounter (Signed)
Xarelto samples placed at the front desk for pick up.

## 2014-06-05 ENCOUNTER — Other Ambulatory Visit (INDEPENDENT_AMBULATORY_CARE_PROVIDER_SITE_OTHER): Payer: Commercial Managed Care - HMO

## 2014-06-05 ENCOUNTER — Other Ambulatory Visit: Payer: Self-pay | Admitting: Internal Medicine

## 2014-06-05 DIAGNOSIS — E1151 Type 2 diabetes mellitus with diabetic peripheral angiopathy without gangrene: Secondary | ICD-10-CM

## 2014-06-05 DIAGNOSIS — E1159 Type 2 diabetes mellitus with other circulatory complications: Secondary | ICD-10-CM

## 2014-06-05 LAB — HEMOGLOBIN A1C: Hgb A1c MFr Bld: 7 % — ABNORMAL HIGH (ref 4.6–6.5)

## 2014-06-05 NOTE — Progress Notes (Signed)
[name] called requesting a1c orders be placed

## 2014-06-06 ENCOUNTER — Other Ambulatory Visit: Payer: Self-pay | Admitting: Internal Medicine

## 2014-06-06 DIAGNOSIS — E1151 Type 2 diabetes mellitus with diabetic peripheral angiopathy without gangrene: Secondary | ICD-10-CM

## 2014-06-09 ENCOUNTER — Ambulatory Visit (INDEPENDENT_AMBULATORY_CARE_PROVIDER_SITE_OTHER): Payer: Commercial Managed Care - HMO

## 2014-06-09 DIAGNOSIS — I4891 Unspecified atrial fibrillation: Secondary | ICD-10-CM

## 2014-06-09 LAB — CBC
HCT: 41.2 % (ref 39.0–52.0)
Hemoglobin: 13.8 g/dL (ref 13.0–17.0)
MCHC: 33.6 g/dL (ref 30.0–36.0)
MCV: 99.7 fl (ref 78.0–100.0)
Platelets: 149 10*3/uL — ABNORMAL LOW (ref 150.0–400.0)
RBC: 4.13 Mil/uL — ABNORMAL LOW (ref 4.22–5.81)
RDW: 13.7 % (ref 11.5–15.5)
WBC: 5.6 10*3/uL (ref 4.0–10.5)

## 2014-06-09 LAB — BASIC METABOLIC PANEL
BUN: 20 mg/dL (ref 6–23)
CO2: 24 mEq/L (ref 19–32)
Calcium: 9.3 mg/dL (ref 8.4–10.5)
Chloride: 108 mEq/L (ref 96–112)
Creatinine, Ser: 1 mg/dL (ref 0.4–1.5)
GFR: 82.98 mL/min (ref >60.00)
Glucose, Bld: 118 mg/dL — ABNORMAL HIGH (ref 70–99)
Potassium: 4.6 mEq/L (ref 3.5–5.1)
Sodium: 142 mEq/L (ref 135–145)

## 2014-06-09 MED ORDER — RIVAROXABAN 20 MG PO TABS: 20.0000 mg | ORAL_TABLET | Freq: Every day | ORAL | Status: DC

## 2014-06-09 NOTE — Progress Notes (Signed)
Pt was started on Xarelto 20mg  QD by Dr Percival Spanish for afib on 11/04/13.    Reviewed patients medication list.  Pt is not currently on any combined P-gp and strong CYP3A4 inhibitors/inducers (ketoconazole, traconazole, ritonavir, carbamazepine, phenytoin, rifampin, St. John's wort).  Reviewed labs.  SCr 1.0, Weight 93kg, CrCl-89.13 .  Dose appropriate based on CrCl.   Hgb and HCT 13.8/41.2 on 06/09/14 Within Normal Limits.   Pt is in the donut hole at present and her 90 day supply of Xarelto is going to cost him 430+ dollars.  Pt is unable to afford this, pt refuses to transition to Coumadin.  Pt was given [phone] to call to inquire about help obtaining medication while in the donut hole.  Pt called back can use a 30 day free trial care at pharmacy, sent rx to [facility] drug as pt requested.  Pt states he can afford to fill one 90 day supply rx of Xarelto but will have to split with his wife as they are both taking at present and they are both in the donut hole.  Advised pt to call for samples, and we can try to help, but we have no samples at present.  Pt verbalized understanding, explained risks associated with being off anticoagulation and emphasized importance of staying on an anticoagulant medication to prevent clot, stroke, and even death.   Called spoke with pt's wife, advised of lab results and advised to continue on same dosage of Xarelto 20mg  once daily.  Pt's wife states she picked up rx for pt at pharmacy yesterday, pt unable to use 30 day free card as he had already used this year, but 30 day supply was only 45 dollars.  Made 6 month Xarelto f/u for pt on 12/09/13 at 9am.

## 2014-06-09 NOTE — Patient Instructions (Signed)

## 2014-06-11 NOTE — Progress Notes (Signed)
Pt. Called with results

## 2014-07-03 ENCOUNTER — Telehealth: Payer: Self-pay | Admitting: Gastroenterology

## 2014-07-03 NOTE — Telephone Encounter (Signed)
Spoke with patient's wife. He is having bright, red blood from rectum when he has a bowel movement. He has it on tissue and in water. He has had problems for a month and they are getting worse. He reports alternating constipation with diarrhea. He is on Xarelto also. He did try one Preparation H suppository but states he had diarrhea after using it. Scheduled with Alonza Bogus, PA on 07/06/14 at 2:00 PM.

## 2014-07-06 ENCOUNTER — Telehealth: Payer: Self-pay

## 2014-07-06 ENCOUNTER — Ambulatory Visit (INDEPENDENT_AMBULATORY_CARE_PROVIDER_SITE_OTHER): Payer: Commercial Managed Care - HMO | Admitting: Gastroenterology

## 2014-07-06 ENCOUNTER — Encounter: Payer: Self-pay | Admitting: Gastroenterology

## 2014-07-06 VITALS — BP 130/80 | HR 70 | Ht 68.0 in | Wt 210.4 lb

## 2014-07-06 DIAGNOSIS — K649 Unspecified hemorrhoids: Secondary | ICD-10-CM | POA: Insufficient documentation

## 2014-07-06 DIAGNOSIS — K625 Hemorrhage of anus and rectum: Secondary | ICD-10-CM

## 2014-07-06 MED ORDER — HYDROCORTISONE ACETATE 25 MG RE SUPP: 25.0000 mg | Freq: Two times a day (BID) | RECTAL | Status: DC

## 2014-07-06 NOTE — Patient Instructions (Signed)
We have sent the following medications to your pharmacy for you to pick up at your convenience: Anusol Suppositories, place one suppository per rectum twice daily  ______________________________________________________________________________________________________________________________________________________________________________  Pearson Grippe A sitz bath is a warm water bath taken in the sitting position that covers only the hips and buttocks. It may be used for either healing or hygiene purposes. Sitz baths are also used to relieve pain, itching, or muscle spasms. The water may contain medicine. Moist heat will help you heal and relax.  HOME CARE INSTRUCTIONS  Take 3 to 4 sitz baths a day. 1. Fill the bathtub half full with warm water. 2. Sit in the water and open the drain a little. 3. Turn on the warm water to keep the tub half full. Keep the water running constantly. 4. Soak in the water for 15 to 20 minutes. 5. After the sitz bath, pat the affected area dry first. SEEK MEDICAL CARE IF:  You get worse instead of better. Stop the sitz baths if you get worse. MAKE SURE YOU:  Understand these instructions.  Will watch your condition.  Will get help right away if you are not doing well or get worse. Document Released: 04/29/2004 Document Revised: 05/01/2012 Document Reviewed: 11/04/2010 [facility] Patient Information 2015 [place], [place]. This information is not intended to replace advice given to you by your health care provider. Make sure you discuss any questions you have with your health care provider.

## 2014-07-06 NOTE — Telephone Encounter (Signed)
Pharmacy called , patient there now.  Verbally gave them rx for Anusol suppositories.

## 2014-07-06 NOTE — Progress Notes (Addendum)
     07/06/2014 Todd [name] [id number] [birth year]   History of Present Illness:  This is a 73 year old male who is previously known to Dr. Sharlett Iles.  He had a colonoscopy in 10/2012 at which time he had only diverticulosis.  He does have a family history of colon cancer in a half-brother.  He presents to our office today with complaints of rectal bleeding.  Has been seeing small amounts of bright red blood on the TP when wiping since Thursday.  Otherwise no complaints but wanted to make sure everything was ok since he is leaving for a cruise this weekend.   Current Medications, Allergies, Past Medical History, Past Surgical History, Family History and Social History were reviewed in [employer] record.   Physical Exam: BP 130/80 mmHg  Pulse 70  Ht 5\' 8"  (1.727 m)  Wt 210 lb 6.4 oz (95.437 kg)  BMI 32.00 kg/m2 General: Well developed white male in no acute distress Head: Normocephalic and atraumatic Eyes:  Sclerae anicteric, conjunctiva pink  Ears: Normal auditory acuity Lungs: Clear throughout to auscultation Rectal:  Internal hemorrhoids noted, one with a red spot with stigmata of recent bleeding. Musculoskeletal: Symmetrical with no gross deformities  Extremities: No edema  Neurological: Alert oriented x 4, grossly non-focal Psychological:  Alert and cooperative. Normal mood and affect  Assessment and Recommendations: -Hemorrhoids, source of recent rectal bleeding:  Will use anusol suppositories BID for 10 days.  Rectal care instructions given.  Keep stools soft.  RTO prn. -Family history of colon cancer:  Should have repeat colonoscopy at 5 year interval.  Addendum: Reviewed and agree with initial management. Jerene Bears, MD

## 2014-07-17 ENCOUNTER — Other Ambulatory Visit (HOSPITAL_COMMUNITY): Payer: Self-pay | Admitting: *Deleted

## 2014-07-17 DIAGNOSIS — I6523 Occlusion and stenosis of bilateral carotid arteries: Secondary | ICD-10-CM

## 2014-07-23 ENCOUNTER — Telehealth: Payer: Self-pay | Admitting: Internal Medicine

## 2014-07-23 NOTE — Telephone Encounter (Signed)
Patient needs [employer] referral.  Has appt 12/8 at 11am for an ultrasound on neck.  Also has an appt with Cardiologist on December 14th at 9:00 on [facility].  Dr. Ula Lingo.

## 2014-07-24 NOTE — Telephone Encounter (Signed)
Appt w/Dr. Percival SpanishLelon Perla #[id number] valid 08/03/14-01/30/15

## 2014-07-24 NOTE — Telephone Encounter (Signed)
Pre-auth submitted for carotid duplex.

## 2014-07-28 ENCOUNTER — Ambulatory Visit (HOSPITAL_COMMUNITY): Payer: Commercial Managed Care - HMO | Attending: Cardiology | Admitting: Cardiology

## 2014-07-28 DIAGNOSIS — I6523 Occlusion and stenosis of bilateral carotid arteries: Secondary | ICD-10-CM | POA: Diagnosis not present

## 2014-07-28 NOTE — Progress Notes (Signed)
Carotid duplex performed 

## 2014-08-03 ENCOUNTER — Encounter: Payer: Self-pay | Admitting: Cardiology

## 2014-08-03 ENCOUNTER — Ambulatory Visit (INDEPENDENT_AMBULATORY_CARE_PROVIDER_SITE_OTHER): Payer: Commercial Managed Care - HMO | Admitting: Cardiology

## 2014-08-03 VITALS — BP 150/90 | HR 75 | Ht 68.0 in | Wt 212.0 lb

## 2014-08-03 DIAGNOSIS — I2510 Atherosclerotic heart disease of native coronary artery without angina pectoris: Secondary | ICD-10-CM

## 2014-08-03 DIAGNOSIS — Z789 Other specified health status: Secondary | ICD-10-CM

## 2014-08-03 DIAGNOSIS — I4891 Unspecified atrial fibrillation: Secondary | ICD-10-CM

## 2014-08-03 DIAGNOSIS — I10 Essential (primary) hypertension: Secondary | ICD-10-CM

## 2014-08-03 LAB — LIPID PANEL
Cholesterol: 141 mg/dL (ref 0–200)
HDL: 40 mg/dL (ref >39)
LDL Cholesterol: 82 mg/dL (ref 0–99)
Total CHOL/HDL Ratio: 3.5 Ratio
Triglycerides: 93 mg/dL (ref <150)
VLDL: 19 mg/dL (ref 0–40)

## 2014-08-03 NOTE — Progress Notes (Addendum)
HPI The patient presents for follow up of CAD and atrial fibrillation.  In March he had a stress perfusion study demonstrating no evidence of ischemia or infarct with preserved ejection fraction.  Since we last saw him he has done well. The patient denies any new symptoms such as chest discomfort, neck or arm discomfort. There has been no new shortness of breath, PND or orthopnea. There have been no reported palpitations, presyncope or syncope.  He is walking two miles per day.      Allergies  Allergen Reactions  . Amlodipine Besylate     Rash Because of a history of documented adverse serious drug reaction;Medi Alert bracelet  is recommended  . Ivp Dye [Iodinated Diagnostic Agents]     Rash Because of a history of documented adverse serious drug reaction;Medi Alert bracelet  is recommended    Current Outpatient Prescriptions  Medication Sig Dispense Refill  . ACCU-CHEK FASTCLIX LANCETS MISC Use to check blood sugars twice a day Dx E11.59 180 each 3  . Blood Glucose Monitoring Suppl (ACCU-CHEK NANO SMARTVIEW) W/DEVICE KIT 1 Device by Does not apply route as needed. 1 kit 0  . cetirizine (ZYRTEC) 10 MG tablet Take 10 mg by mouth daily as needed for allergies.     . Cholecalciferol (VITAMIN D-3) 5000 UNITS TABS Take 5,000 Units by mouth daily.     . fish oil-omega-3 fatty acids 1000 MG capsule Take 1 g by mouth 2 (two) times daily.     Marland Kitchen glucose 4 GM chewable tablet Chew 1 tablet by mouth as needed for low blood sugar.    . metFORMIN (GLUCOPHAGE-XR) 500 MG 24 hr tablet Take 1 tablet (500 mg total) by mouth 2 (two) times daily. 180 tablet 1  . metoprolol (LOPRESSOR) 50 MG tablet Take 1 tablet (50 mg total) by mouth 2 (two) times daily. 180 tablet 3  . Multiple Vitamin (MULTIVITAMIN) tablet Take 1 tablet by mouth daily.      . nitroGLYCERIN (NITROSTAT) 0.4 MG SL tablet Place 1 tablet (0.4 mg total) under the tongue every 5 (five) minutes as needed for chest pain. 25 tablet 6  . omeprazole  (PRILOSEC) 20 MG capsule Take 1 capsule (20 mg total) by mouth 2 (two) times daily. (Patient taking differently: Take 20 mg by mouth daily before breakfast. ) 180 capsule 3  . polycarbophil (FIBERCON) 625 MG tablet Take 625 mg by mouth 2 (two) times daily.     . simvastatin (ZOCOR) 40 MG tablet Take 1 tablet (40 mg total) by mouth at bedtime. (Patient taking differently: Take 40 mg by mouth every evening. ) 90 tablet 3   No current facility-administered medications for this visit.    Past Medical History  Diagnosis Date  . HLD (hyperlipidemia)   . HTN (hypertension)   . Allergic rhinitis   . Nephrolithiasis   . PVD (peripheral vascular disease)     Carotid stenosis  . Stroke   . Asthma   . Diabetes mellitus   . CAD (coronary artery disease)     a. 1999; s/p CABG: LIMA to LAD, SVG to OM1, SVG to OM2, SVG to RCA  b. Normal nuc 10/2013 (done because of new onset AF.)  . Atrial fibrillation with rapid ventricular response     a. newly diagnosed 11/03/13, spont conv to NSR, placed on xarelto.  Marland Kitchen Overweight(278.02)     Past Surgical History  Procedure Laterality Date  . Colonoscopy w/ polypectomy  2009    Dr Sharlett Iles  .  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 Carotid endarterectomy  1999  . Nasal sinus surgery    . Colonoscopy  2014    negative    ROS:  As stated in the HPI and negative for all other systems.  PHYSICAL EXAM BP 150/90 mmHg  Pulse 75  Ht 5' 8"  (1.727 m)  Wt 212 lb (96.163 kg)  BMI 32.24 kg/m2 GENERAL:  Well appearing NECK:  No jugular venous distention, waveform within normal limits, carotid upstroke brisk and symmetric, no bruits, no thyromegaly LUNGS:  Clear to auscultation bilaterally CHEST:  Well healed sternotomy scar. HEART:  PMI not displaced or sustained,S1 and S2 within normal limits, no S3, no S4, no clicks, no rubs, no murmurs ABD:  Flat, positive bowel sounds normal in frequency in pitch, no bruits, no rebound, no guarding, no midline pulsatile  mass, no hepatomegaly, no splenomegaly EXT:  2 plus pulses throughout, no edema, no cyanosis no clubbing  EKG:  Sinus rhythm, rate 75, axis within normal limits, intervals within normal limits, no acute ST-T wave changes.   11/18/2014  ASSESSMENT AND PLAN  ATRIAL FIB - The patient had had no symptomatic recurrence of this. Todd [name] has a CHA2DS2 - VASc score of 6 he will remain on anticoagulatoin. .  C A D -  The patient has no new sypmtoms since stress testing earlier this year.  No further cardiovascular testing is indicated. We will continue with aggressive risk reduction and meds as listed.   CAROTID ARTERY DISEASE -  He had follow up last week and has 60 - 79% right stenosis and will get follow up in six months.  HYPERLIPIDEMIA -  I will order a lipid profile today.  Liver enzymes were OK earlier this year.  OVERWEIGHT/OBESITY -  He understands the need to lose weight and we have talked about this in the past.     HYPERTENSION - The blood pressure is elevated today.  However this is unusual. No change in medications is indicated. We will continue with therapeutic lifestyle changes (TLC).  DM A1C was 7.0 in October.  Per [name], MD

## 2014-08-03 NOTE — Patient Instructions (Signed)
Your physician recommends that you schedule a follow-up appointment in: 0ne year with Dr. Percival Spanish  We have labs for you to get done  And a doppler in 6 months

## 2014-08-24 ENCOUNTER — Telehealth: Payer: Self-pay | Admitting: Internal Medicine

## 2014-08-24 ENCOUNTER — Other Ambulatory Visit: Payer: Self-pay | Admitting: *Deleted

## 2014-08-24 DIAGNOSIS — E1151 Type 2 diabetes mellitus with diabetic peripheral angiopathy without gangrene: Secondary | ICD-10-CM

## 2014-08-24 MED ORDER — RIVAROXABAN 20 MG PO TABS: 20.0000 mg | ORAL_TABLET | Freq: Every day | ORAL | Status: DC

## 2014-08-24 MED ORDER — OMEPRAZOLE 20 MG PO CPDR: 20.0000 mg | DELAYED_RELEASE_CAPSULE | Freq: Two times a day (BID) | ORAL | Status: DC

## 2014-08-24 MED ORDER — METOPROLOL TARTRATE 50 MG PO TABS: 50.0000 mg | ORAL_TABLET | Freq: Two times a day (BID) | ORAL | Status: DC

## 2014-08-24 MED ORDER — SIMVASTATIN 40 MG PO TABS: 40.0000 mg | ORAL_TABLET | Freq: Every day | ORAL | Status: DC

## 2014-08-24 MED ORDER — GLUCOSE BLOOD VI STRP: ORAL_STRIP | Status: DC

## 2014-08-24 MED ORDER — METFORMIN HCL ER 500 MG PO TB24: 500.0000 mg | ORAL_TABLET | Freq: Two times a day (BID) | ORAL | Status: DC

## 2014-08-24 MED ORDER — ACCU-CHEK SOFT TOUCH LANCETS MISC: Status: DC

## 2014-08-24 NOTE — Telephone Encounter (Signed)
Done

## 2014-08-24 NOTE — Telephone Encounter (Signed)
Patient is requesting refill on metformin and glucose testing strips and needles sent to [facility] order.  Requesting 90 day supply

## 2014-08-28 ENCOUNTER — Telehealth: Payer: Self-pay | Admitting: Internal Medicine

## 2014-08-28 ENCOUNTER — Telehealth: Payer: Self-pay

## 2014-08-28 ENCOUNTER — Other Ambulatory Visit: Payer: Self-pay | Admitting: Internal Medicine

## 2014-08-28 DIAGNOSIS — E1151 Type 2 diabetes mellitus with diabetic peripheral angiopathy without gangrene: Secondary | ICD-10-CM

## 2014-08-28 NOTE — Telephone Encounter (Signed)
Wife called in and said pt needs referral to:  [facility]  Address: [address] [place], [place] [address]  [phone]  Appt 11/20/2014 at 8:30

## 2014-08-28 NOTE — Telephone Encounter (Signed)
Referral entered  

## 2014-08-28 NOTE — Telephone Encounter (Signed)
Received a fax from Ruth needing clarification on patient's lancets, whether he uses Accu-chek fastclix or soft touch lancets.   Phone call to patient asking him this information. He states he uses soft touch lancets. [employer] has been notified via fax of patient's use.

## 2014-08-31 ENCOUNTER — Telehealth: Payer: Self-pay | Admitting: *Deleted

## 2014-08-31 NOTE — Telephone Encounter (Signed)
Patient's wife would like to talk to you about her husband possibly trying Warfarin since the insurance company denied the tier exception on his Xarelto.

## 2014-08-31 NOTE — Telephone Encounter (Signed)
Tier exception for Xarelto denied. Must provide a physician statement "that the preferred drug wouldn't be as effective as the requested drug/or would have adverse effects. Failure of all drugs in the lower cost-sharing tiers to effectively treat the member's condition or documentation of adverse effects must be documented before a tiering exception will be granted". Will notify the patient of decision.

## 2014-09-01 NOTE — Telephone Encounter (Signed)
OK to switch to warfarin.

## 2014-09-02 NOTE — Telephone Encounter (Signed)
Spoke with pt wife, they are going to appeal the denial. The patient does not want to take warfarin.

## 2014-09-04 ENCOUNTER — Telehealth: Payer: Self-pay

## 2014-09-04 ENCOUNTER — Telehealth: Payer: Self-pay | Admitting: *Deleted

## 2014-09-04 NOTE — Telephone Encounter (Signed)
Patient 's wife called wanting to file an appeal for patient xarelto i called [facility] and gave it to the triage nurse to give to [facility]

## 2014-09-04 NOTE — Telephone Encounter (Signed)
Todd [name] called in needing to speak with JC about patient's Eliquis. She states that S. Dortch, RN had done PA for [name] but it got denied and needed an appeal. S. Dortch, RN is not available to complete this. Asked [name] if there was the original form that was sent in for the PA and she does not have this.   Of note, patient is on XARELTO and it is documented in EPIC from 1/11 where this was addressed.   Will route to Dr. Rosezella [place] nurse, JC to work on appeal

## 2014-09-07 NOTE — Telephone Encounter (Signed)
I have called [employer] and started the denile appeal process , supporting documentation faxed to [facility] ref # [id number]

## 2014-09-09 ENCOUNTER — Ambulatory Visit (INDEPENDENT_AMBULATORY_CARE_PROVIDER_SITE_OTHER): Payer: Commercial Managed Care - HMO | Admitting: Internal Medicine

## 2014-09-09 ENCOUNTER — Encounter: Payer: Self-pay | Admitting: Internal Medicine

## 2014-09-09 ENCOUNTER — Telehealth: Payer: Self-pay | Admitting: Cardiology

## 2014-09-09 VITALS — BP 122/88 | HR 73 | Temp 97.9°F | Ht 68.0 in | Wt 215.0 lb

## 2014-09-09 DIAGNOSIS — J018 Other acute sinusitis: Secondary | ICD-10-CM

## 2014-09-09 DIAGNOSIS — J209 Acute bronchitis, unspecified: Secondary | ICD-10-CM

## 2014-09-09 MED ORDER — HYDROCODONE-HOMATROPINE 5-1.5 MG/5ML PO SYRP: 5.0000 mL | ORAL_SOLUTION | Freq: Four times a day (QID) | ORAL | Status: DC | PRN

## 2014-09-09 MED ORDER — AMOXICILLIN 500 MG PO CAPS: 500.0000 mg | ORAL_CAPSULE | Freq: Three times a day (TID) | ORAL | Status: DC

## 2014-09-09 NOTE — Progress Notes (Signed)
Pre visit review using our clinic review tool, if applicable. No additional management support is needed unless otherwise documented below in the visit note. 

## 2014-09-09 NOTE — Telephone Encounter (Signed)
Received call from [place] with [employer] she wanted Dr.Hochrein to know Xarelto approved.

## 2014-09-09 NOTE — Patient Instructions (Signed)

## 2014-09-09 NOTE — Progress Notes (Signed)
   Subjective:    Patient ID: Todd [name], male    DOB: August 21, 1941, 74 y.o.   MRN: [id number]  HPI  Symptoms began 11 days ago as a nonproductive cough. It has persisted as a rattly congestion in the chest without frank sputum production  He was having fever up to 102 until last week. He continues to have chills and sweats.  The cough is severe enough to awaken him.  He has more head congestion than chest congestion with obstruction nasally and the production of yellow-green secretions  He also describes fatigue and halitosis. FBS 100-102 in Winter months.   Review of Systems He is not having frontal or facial sinus pain. He has no dental pain or otic pain.  The cough is not associated with wheezing or shortness of breath.    Objective:   Physical Exam  Pertinent or positive findings:  He is hoarse and exhibits hyponasal speech pattern He has erythema and congestion of the nares Tympanic membranes are dull. Heart sounds are distant; heart rhythm appears to be regular rhythm not atrial fib.  General appearance:good health ;well nourished; no acute distress or increased work of breathing is present.  No  lymphadenopathy about the head, neck, or axilla noted.  Eyes: No conjunctival inflammation or lid edema is present. There is no scleral icterus. Ears:  External ear exam shows no significant lesions or deformities.  Otoscopic examination reveals clear canals, tympanic membranes are intact bilaterally without bulging, retraction, inflammation or discharge. Nose:  External nasal examination shows no deformity or inflammation.  Oral exam: Dental hygiene is good; lips and gums are healthy appearing.There is no oropharyngeal erythema or exudate noted.  Neck:  No deformities, thyromegaly, masses, or tenderness noted.   Supple with full range of motion without pain.  Heart:  Normal rate and regular rhythm. S1 and S2 normal without gallop, murmur, click, rub or other extra sounds.    Lungs:Chest clear to auscultation; no wheezes, rhonchi,rales ,or rubs present.No increased work of breathing.   Extremities:  No cyanosis, edema, or clubbing  noted  Skin: Warm & dry w/o jaundice or tenting.      Assessment & Plan:  #1 rhinosinusitis  #2 acute bronchitis  Plan: Nasal hygiene interventions discussed. See prescription medications

## 2014-09-09 NOTE — Telephone Encounter (Signed)
Attempted to call. Line provided rings, goes to full VM box, then connects to auxiliary answering service which disconnected.

## 2014-09-10 ENCOUNTER — Telehealth: Payer: Self-pay | Admitting: Internal Medicine

## 2014-09-10 MED ORDER — ACCU-CHEK FASTCLIX LANCETS MISC: Status: DC

## 2014-09-10 NOTE — Telephone Encounter (Signed)
Called pt clarified lancets pt is needing accu-chek fastclix. Inform her will send rx to [facility]...Johny Chess

## 2014-09-10 NOTE — Telephone Encounter (Signed)
Pt wife called in said that they got wrong lancets ,  He needs the Fast Click Draws 6 preloaded.

## 2014-10-21 ENCOUNTER — Telehealth: Payer: Self-pay

## 2014-10-21 DIAGNOSIS — E1151 Type 2 diabetes mellitus with diabetic peripheral angiopathy without gangrene: Secondary | ICD-10-CM

## 2014-10-21 NOTE — Telephone Encounter (Signed)
Original orders for micro albumin and a1c had expired. Re-put in so patient can have this done

## 2014-10-22 ENCOUNTER — Other Ambulatory Visit (INDEPENDENT_AMBULATORY_CARE_PROVIDER_SITE_OTHER): Payer: Commercial Managed Care - HMO

## 2014-10-22 DIAGNOSIS — E1151 Type 2 diabetes mellitus with diabetic peripheral angiopathy without gangrene: Secondary | ICD-10-CM

## 2014-10-22 DIAGNOSIS — E1159 Type 2 diabetes mellitus with other circulatory complications: Secondary | ICD-10-CM

## 2014-10-22 LAB — HEMOGLOBIN A1C: Hgb A1c MFr Bld: 8.3 % — ABNORMAL HIGH (ref 4.6–6.5)

## 2014-10-22 LAB — MICROALBUMIN / CREATININE URINE RATIO
Creatinine,U: 23 mg/dL
Microalb Creat Ratio: 3 mg/g (ref 0.0–30.0)
Microalb, Ur: <0.7 mg/dL (ref 0.0–1.9)

## 2014-11-07 ENCOUNTER — Other Ambulatory Visit: Payer: Self-pay | Admitting: Internal Medicine

## 2014-11-11 ENCOUNTER — Encounter (HOSPITAL_COMMUNITY): Payer: Self-pay | Admitting: Emergency Medicine

## 2014-11-11 ENCOUNTER — Telehealth: Payer: Self-pay | Admitting: *Deleted

## 2014-11-11 ENCOUNTER — Emergency Department (HOSPITAL_COMMUNITY)
Admission: EM | Admit: 2014-11-11 | Discharge: 2014-11-11 | Disposition: A | Discharge status: Home / Self Care | Payer: Commercial Managed Care - HMO | Attending: Emergency Medicine | Admitting: Emergency Medicine

## 2014-11-11 DIAGNOSIS — Z951 Presence of aortocoronary bypass graft: Secondary | ICD-10-CM | POA: Diagnosis not present

## 2014-11-11 DIAGNOSIS — I4891 Unspecified atrial fibrillation: Secondary | ICD-10-CM | POA: Diagnosis not present

## 2014-11-11 DIAGNOSIS — E663 Overweight: Secondary | ICD-10-CM | POA: Diagnosis not present

## 2014-11-11 DIAGNOSIS — K625 Hemorrhage of anus and rectum: Secondary | ICD-10-CM | POA: Diagnosis present

## 2014-11-11 DIAGNOSIS — J45909 Unspecified asthma, uncomplicated: Secondary | ICD-10-CM | POA: Diagnosis not present

## 2014-11-11 DIAGNOSIS — Z7901 Long term (current) use of anticoagulants: Secondary | ICD-10-CM | POA: Insufficient documentation

## 2014-11-11 DIAGNOSIS — K644 Residual hemorrhoidal skin tags: Secondary | ICD-10-CM | POA: Insufficient documentation

## 2014-11-11 DIAGNOSIS — Z8673 Personal history of transient ischemic attack (TIA), and cerebral infarction without residual deficits: Secondary | ICD-10-CM | POA: Insufficient documentation

## 2014-11-11 DIAGNOSIS — Z792 Long term (current) use of antibiotics: Secondary | ICD-10-CM | POA: Diagnosis not present

## 2014-11-11 DIAGNOSIS — I2510 Atherosclerotic heart disease of native coronary artery without angina pectoris: Secondary | ICD-10-CM | POA: Diagnosis not present

## 2014-11-11 DIAGNOSIS — I10 Essential (primary) hypertension: Secondary | ICD-10-CM | POA: Insufficient documentation

## 2014-11-11 DIAGNOSIS — Z87442 Personal history of urinary calculi: Secondary | ICD-10-CM | POA: Insufficient documentation

## 2014-11-11 DIAGNOSIS — E785 Hyperlipidemia, unspecified: Secondary | ICD-10-CM | POA: Diagnosis not present

## 2014-11-11 DIAGNOSIS — E119 Type 2 diabetes mellitus without complications: Secondary | ICD-10-CM | POA: Diagnosis not present

## 2014-11-11 DIAGNOSIS — Z79899 Other long term (current) drug therapy: Secondary | ICD-10-CM | POA: Insufficient documentation

## 2014-11-11 LAB — CBC
HCT: 39.4 % (ref 39.0–52.0)
Hemoglobin: 13.5 g/dL (ref 13.0–17.0)
MCH: 33.1 pg (ref 26.0–34.0)
MCHC: 34.3 g/dL (ref 30.0–36.0)
MCV: 96.6 fL (ref 78.0–100.0)
Platelets: 150 10*3/uL (ref 150–400)
RBC: 4.08 MIL/uL — ABNORMAL LOW (ref 4.22–5.81)
RDW: 13.7 % (ref 11.5–15.5)
WBC: 4.8 10*3/uL (ref 4.0–10.5)

## 2014-11-11 LAB — COMPREHENSIVE METABOLIC PANEL
ALT: 40 U/L (ref 0–53)
AST: 44 U/L — ABNORMAL HIGH (ref 0–37)
Albumin: 3.8 g/dL (ref 3.5–5.2)
Alkaline Phosphatase: 59 U/L (ref 39–117)
Anion gap: 8 (ref 5–15)
BUN: 22 mg/dL (ref 6–23)
CO2: 24 mmol/L (ref 19–32)
Calcium: 9 mg/dL (ref 8.4–10.5)
Chloride: 108 mmol/L (ref 96–112)
Creatinine, Ser: 0.86 mg/dL (ref 0.50–1.35)
GFR calc Af Amer: >90 mL/min (ref >90)
GFR calc non Af Amer: 85 mL/min — ABNORMAL LOW (ref >90)
Glucose, Bld: 221 mg/dL — ABNORMAL HIGH (ref 70–99)
Potassium: 4 mmol/L (ref 3.5–5.1)
Sodium: 140 mmol/L (ref 135–145)
Total Bilirubin: 0.6 mg/dL (ref 0.3–1.2)
Total Protein: 6.9 g/dL (ref 6.0–8.3)

## 2014-11-11 LAB — TYPE AND SCREEN
ABO/RH(D): B POS
Antibody Screen: NEGATIVE

## 2014-11-11 LAB — PROTIME-INR
INR: 1.55 — ABNORMAL HIGH (ref 0.00–1.49)
Prothrombin Time: 18.7 seconds — ABNORMAL HIGH (ref 11.6–15.2)

## 2014-11-11 LAB — ABO/RH: ABO/RH(D): B POS

## 2014-11-11 LAB — POC OCCULT BLOOD, ED: Fecal Occult Bld: POSITIVE — AB

## 2014-11-11 NOTE — Telephone Encounter (Signed)
Patient and wife walked into clinic. He states when he went to the bathroom this AM, he had a large amount of bright, red blood in toilet. He states he did have some stool but mostly blood. Denies any constipation or pain. States when he wiped he did not have blood on tissue. He reports hx of hemorrhoids with bleeding in past but never "this much blood." Patient is on Xarelto.No OV available this AM. Patient instructed to go to ED for evaluation.

## 2014-11-11 NOTE — ED Notes (Signed)
Questions, concerns denied r/t dc. Pt ambulatory and a&ox4 

## 2014-11-11 NOTE — ED Provider Notes (Signed)
CSN: [id number]     Arrival date & time 11/11/14  1022 History   First MD Initiated Contact with Patient 11/11/14 1045     Chief Complaint  Patient presents with  . Rectal Bleeding     (Consider location/radiation/quality/duration/timing/severity/associated sxs/prior Treatment) Patient is a 74 y.o. male presenting with hematochezia. The history is provided by the patient.  Rectal Bleeding Quality:  Bright red Amount:  Moderate Timing: once. Progression:  Unchanged Chronicity:  New Context: hemorrhoids   Similar prior episodes: yes   Relieved by:  Nothing Worsened by:  Nothing tried Ineffective treatments:  None tried Associated symptoms: no abdominal pain, no fever and no vomiting     Past Medical History  Diagnosis Date  . HLD (hyperlipidemia)   . HTN (hypertension)   . Allergic rhinitis   . Nephrolithiasis   . PVD (peripheral vascular disease)     a. 1999; s/p LCEA. b. Duplex 06/2013: stable 40-59% RICA, patent LCEA 1-39%, mild RECA stenosis, mild bilat subclavian stenosis. Next due 06/2014.  . Stroke   . Asthma   . Diabetes mellitus   . CAD (coronary artery disease)     a. 1999; s/p CABG: LIMA to LAD, SVG to OM1, SVG to OM2, SVG to RCA  b. Normal nuc 10/2013 (done because of new onset AF.)  . Atrial fibrillation with rapid ventricular response     a. newly diagnosed 11/03/13, spont conv to NSR, placed on xarelto.  Marland Kitchen Overweight(278.02)    Past Surgical History  Procedure Laterality Date  . Colonoscopy w/ polypectomy  2009    Dr Sharlett Iles  . 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 Carotid endarterectomy  1999  . Nasal sinus surgery    . Colonoscopy  2014    negative   Family History  Problem Relation Age of Onset  . Asthma Sister   . Prostate cancer Brother   . Hemochromatosis Brother   . Heart attack Mother 70  . Colon cancer Brother 67  . Heart attack Brother 53  . Diabetes Father     IDDM  . Stroke Father 3  . Heart attack Brother 66   History    Substance Use Topics  . Smoking status: Never Smoker   . Smokeless tobacco: Never Used  . Alcohol Use: No    Review of Systems  Constitutional: Negative for fever.  HENT: Negative for drooling and rhinorrhea.   Eyes: Negative for pain.  Respiratory: Negative for cough and shortness of breath.   Cardiovascular: Negative for chest pain and leg swelling.  Gastrointestinal: Positive for hematochezia. Negative for nausea, vomiting, abdominal pain and diarrhea.  Genitourinary: Negative for dysuria and hematuria.       Bloody stool  Musculoskeletal: Negative for gait problem and neck pain.  Skin: Negative for color change.  Neurological: Negative for numbness and headaches.  Hematological: Negative for adenopathy.  Psychiatric/Behavioral: Negative for behavioral problems.  All other systems reviewed and are negative.     Allergies  Amlodipine besylate and Ivp dye  Home Medications   Prior to Admission medications   Medication Sig Start Date End Date Taking? Authorizing Provider  ACCU-CHEK FASTCLIX LANCETS MISC Use to check blood sugars twice a day Dx E11.59 09/10/14  Yes Hendricks Limes, MD  ACCU-CHEK SMARTVIEW test strip USE AS INSTRUCTED  11/09/14  Yes Hendricks Limes, MD  Blood Glucose Monitoring Suppl (ACCU-CHEK NANO SMARTVIEW) W/DEVICE KIT 1 Device by Does not apply route as needed. 09/15/13  Yes Darrick Penna  Linna Darner, MD  cetirizine (ZYRTEC) 10 MG tablet Take 10 mg by mouth daily as needed for allergies.    Yes Historical Provider, MD  Cholecalciferol (VITAMIN D-3) 5000 UNITS TABS Take 5,000 Units by mouth daily.    Yes Historical Provider, MD  fish oil-omega-3 fatty acids 1000 MG capsule Take 1 g by mouth 2 (two) times daily.    Yes Historical Provider, MD  glucose 4 GM chewable tablet Chew 1 tablet by mouth as needed for low blood sugar.   Yes Historical Provider, MD  metFORMIN (GLUCOPHAGE-XR) 500 MG 24 hr tablet Take 1 tablet (500 mg total) by mouth 2 (two) times daily. 08/24/14   Yes Hendricks Limes, MD  metoprolol (LOPRESSOR) 50 MG tablet Take 1 tablet (50 mg total) by mouth 2 (two) times daily. 08/24/14  Yes Minus Breeding, MD  Multiple Vitamin (MULTIVITAMIN) tablet Take 1 tablet by mouth daily.     Yes Historical Provider, MD  nitroGLYCERIN (NITROSTAT) 0.4 MG SL tablet Place 1 tablet (0.4 mg total) under the tongue every 5 (five) minutes as needed for chest pain. 09/07/11 08/21/97 Yes Minus Breeding, MD  omeprazole (PRILOSEC) 20 MG capsule Take 1 capsule (20 mg total) by mouth 2 (two) times daily. Patient taking differently: Take 20 mg by mouth daily before breakfast.  08/24/14  Yes Minus Breeding, MD  polycarbophil (FIBERCON) 625 MG tablet Take 625 mg by mouth 2 (two) times daily.    Yes Historical Provider, MD  Polyethyl Glycol-Propyl Glycol 0.4-0.3 % SOLN Place 1-2 drops into both eyes daily as needed (for dry eyes).   Yes Historical Provider, MD  rivaroxaban (XARELTO) 20 MG TABS tablet Take 1 tablet (20 mg total) by mouth daily with supper. 08/24/14  Yes Minus Breeding, MD  simvastatin (ZOCOR) 40 MG tablet Take 1 tablet (40 mg total) by mouth at bedtime. Patient taking differently: Take 40 mg by mouth every evening.  08/24/14  Yes Minus Breeding, MD  amoxicillin (AMOXIL) 500 MG capsule Take 1 capsule (500 mg total) by mouth 3 (three) times daily. Patient not taking: Reported on 11/11/2014 09/09/14   Hendricks Limes, MD  HYDROcodone-homatropine (HYDROMET) 5-1.5 MG/5ML syrup Take 5 mLs by mouth every 6 (six) hours as needed for cough. Patient not taking: Reported on 11/11/2014 09/09/14   Hendricks Limes, MD   BP 155/63 mmHg  Pulse 73  Temp(Src) 98.5 F (36.9 C) (Oral)  Resp 18  SpO2 100% Physical Exam  Constitutional: He is oriented to person, place, and time. He appears well-developed and well-nourished.  HENT:  Head: Normocephalic and atraumatic.  Right Ear: External ear normal.  Left Ear: External ear normal.  Nose: Nose normal.  Mouth/Throat: Oropharynx is clear and  moist. No oropharyngeal exudate.  Eyes: Conjunctivae and EOM are normal. Pupils are equal, round, and reactive to light.  Neck: Normal range of motion. Neck supple.  Cardiovascular: Normal rate, regular rhythm, normal heart sounds and intact distal pulses.  Exam reveals no gallop and no friction rub.   No murmur heard. Pulmonary/Chest: Effort normal and breath sounds normal. No respiratory distress. He has no wheezes.  Abdominal: Soft. Bowel sounds are normal. He exhibits no distension. There is no tenderness. There is no rebound and no guarding.  Genitourinary:     Musculoskeletal: Normal range of motion. He exhibits no edema or tenderness.  Neurological: He is alert and oriented to person, place, and time.  Skin: Skin is warm and dry.  Psychiatric: He has a normal mood and affect.  His behavior is normal.  Nursing note and vitals reviewed.   ED Course  Procedures (including critical care time) Labs Review Labs Reviewed  CBC - Abnormal; Notable for the following:    RBC 4.08 (*)    All other components within normal limits  COMPREHENSIVE METABOLIC PANEL - Abnormal; Notable for the following:    Glucose, Bld 221 (*)    AST 44 (*)    GFR calc non Af Amer 85 (*)    All other components within normal limits  PROTIME-INR - Abnormal; Notable for the following:    Prothrombin Time 18.7 (*)    INR 1.55 (*)    All other components within normal limits  POC OCCULT BLOOD, ED - Abnormal; Notable for the following:    Fecal Occult Bld POSITIVE (*)    All other components within normal limits  OCCULT BLOOD X 1 CARD TO LAB, STOOL  TYPE AND SCREEN  ABO/RH    Imaging Review No results found.   EKG Interpretation None      MDM   Final diagnoses:  Rectal bleeding  External hemorrhoid    11:23 AM 74 y.o. male w hx of CAD s/p CABG on xarelto, CVA, DM, paroxysmal afib who presents with a bloody bowel movement this morning. He states that he has an external hemorrhoid and  occasionally sees spotting of bright red blood when wiping. He noted that his bowel movement this morning had a lot of blood in it. He states that he otherwise feels well and has no other complaints. Vital signs unremarkable here. On exam he does have a small external hemorrhoid which becomes more prominent during the internal rectal exam. We'll get screening lab work.  1:00 PM: I interpreted/reviewed the labs and/or imaging which were non-contributory.  VSS. The patient is asymptomatic and continues to appear well. I gave him return precautions for recurrent bloody bowel movements or symptoms of anemia. Sx likely related to external hemorrhoid. I have discussed the diagnosis/risks/treatment options with the patient and family and believe the pt to be eligible for discharge home to follow-up with his pcp in 2-3 days. We also discussed returning to the ED immediately if new or worsening sx occur. We discussed the sx which are most concerning (e.g., worsening bleeding, sob, fatigue) that necessitate immediate return. Medications administered to the patient during their visit and any new prescriptions provided to the patient are listed below.  Medications given during this visit Medications - No data to display  New Prescriptions   No medications on file     Pamella Pert, MD 11/11/14 1302

## 2014-11-11 NOTE — Discharge Instructions (Signed)
Bloody Stools  Bloody stools often mean that there is a problem in the digestive tract. Your caregiver may use the term "melena" to describe black, tarry, and bad smelling stools or "hematochezia" to describe red or maroon-colored stools. Blood seen in the stool can be caused by bleeding anywhere along the intestinal tract.   A black stool usually means that blood is coming from the upper part of the gastrointestinal tract (esophagus, stomach, or small bowel). Passing maroon-colored stools or bright red blood usually means that blood is coming from lower down in the large bowel or the rectum. However, sometimes massive bleeding in the stomach or small intestine can cause bright red bloody stools.   Consuming black licorice, lead, iron pills, medicines containing bismuth subsalicylate, or blueberries can also cause black stools. Your caregiver can test black stools to see if blood is present.  It is important that the cause of the bleeding be found. Treatment can then be started, and the problem can be corrected. Rectal bleeding may not be serious, but you should not assume everything is okay until you know the cause. It is very important to follow up with your caregiver or a specialist in gastrointestinal problems.  CAUSES   Blood in the stools can come from various underlying causes. Often, the cause is not found during your first visit. Testing is often needed to discover the cause of bleeding in the gastrointestinal tract. Causes range from simple to serious or even life-threatening. Possible causes include:  · Hemorrhoids. These are veins that are full of blood (engorged) in the rectum. They cause pain, inflammation, and may bleed.  · Anal fissures. These are areas of painful tearing which may bleed. They are often caused by passing hard stool.  · Diverticulosis. These are pouches that form on the colon over time, with age, and may bleed significantly.  · Diverticulitis. This is inflammation in areas with  diverticulosis. It can cause pain, fever, and bloody stools, although bleeding is rare.  · Proctitis and colitis. These are inflamed areas of the rectum or colon. They may cause pain, fever, and bloody stools.  · Polyps and cancer. Colon cancer is a leading cause of preventable cancer death. It often starts out as precancerous polyps that can be removed during a colonoscopy, preventing progression into cancer. Sometimes, polyps and cancer may cause rectal bleeding.  · Gastritis and ulcers. Bleeding from the upper gastrointestinal tract (near the stomach) may travel through the intestines and produce black, sometimes tarry, often bad smelling stools. In certain cases, if the bleeding is fast enough, the stools may not be black, but red and the condition may be life-threatening.  SYMPTOMS   You may have stools that are bright red and bloody, that are normal color with blood on them, or that are dark black and tarry. In some cases, you may only have blood in the toilet bowl. Any of these cases need medical care. You may also have:  · Pain at the anus or anywhere in the rectum.  · Lightheadedness or feeling faint.  · Extreme weakness.  · Nausea or vomiting.  · Fever.  DIAGNOSIS  Your caregiver may use the following methods to find the cause of your bleeding:  · Taking a medical history. Age is important. Older people tend to develop polyps and cancer more often. If there is anal pain and a hard, large stool associated with bleeding, a tear of the anus may be the cause. If blood drips into the toilet after a bowel movement, bleeding hemorrhoids may be the   problem. The color and frequency of the bleeding are additional considerations. In most cases, the medical history provides clues, but seldom the final answer.  · A visual and finger (digital) exam. Your caregiver will inspect the anal area, looking for tears and hemorrhoids. A finger exam can provide information when there is tenderness or a growth inside. In men, the  prostate is also examined.  · Endoscopy. Several types of small, long scopes (endoscopes) are used to view the colon.  ¨ In the office, your caregiver may use a rigid, or more commonly, a flexible viewing sigmoidoscope. This exam is called flexible sigmoidoscopy. It is performed in 5 to 10 minutes.  ¨ A more thorough exam is accomplished with a colonoscope. It allows your caregiver to view the entire 5 to 6 foot long colon. Medicine to help you relax (sedative) is usually given for this exam. Frequently, a bleeding lesion may be present beyond the reach of the sigmoidoscope. So, a colonoscopy may be the best exam to start with. Both exams are usually done on an outpatient basis. This means the patient does not stay overnight in the hospital or surgery center.  ¨ An upper endoscopy may be needed to examine your stomach. Sedation is used and a flexible endoscope is put in your mouth, down to your stomach.  · A barium enema X-ray. This is an X-ray exam. It uses liquid barium inserted by enema into the rectum. This test alone may not identify an actual bleeding point. X-rays highlight abnormal shadows, such as those made by lumps (tumors), diverticuli, or colitis.  TREATMENT   Treatment depends on the cause of your bleeding.   · For bleeding from the stomach or colon, the caregiver doing your endoscopy or colonoscopy may be able to stop the bleeding as part of the procedure.  · Inflammation or infection of the colon can be treated with medicines.  · Many rectal problems can be treated with creams, suppositories, or warm baths.  · Surgery is sometimes needed.  · Blood transfusions are sometimes needed if you have lost a lot of blood.  · For any bleeding problem, let your caregiver know if you take aspirin or other blood thinners regularly.  HOME CARE INSTRUCTIONS   · Take any medicines exactly as prescribed.  · Keep your stools soft by eating a diet high in fiber. Prunes (1 to 3 a day) work well for many people.  · Drink  enough water and fluids to keep your urine clear or pale yellow.  · Take sitz baths if advised. A sitz bath is when you sit in a bathtub with warm water for 10 to 15 minutes to soak, soothe, and cleanse the rectal area.  · If enemas or suppositories are advised, be sure you know how to use them. Tell your caregiver if you have problems with this.  · Monitor your bowel movements to look for signs of improvement or worsening.  SEEK MEDICAL CARE IF:   · You do not improve in the time expected.  · Your condition worsens after initial improvement.  · You develop any new symptoms.  SEEK IMMEDIATE MEDICAL CARE IF:   · You develop severe or prolonged rectal bleeding.  · You vomit blood.  · You feel weak or faint.  · You have a fever.  MAKE SURE YOU:  · Understand these instructions.  · Will watch your condition.  · Will get help right away if you are not doing well or get worse.    Document Released: 07/28/2002 Document Revised: 10/30/2011 Document Reviewed: 12/23/2010  Exit[employer]® Patient Information ©2015 [employer]. This information is not intended to replace advice given to you by your health care provider. Make sure you discuss any questions you have with your health care provider.

## 2014-11-11 NOTE — Telephone Encounter (Signed)
Agree. ED evaluation is most appropriate

## 2014-11-11 NOTE — ED Notes (Signed)
Pt states that he is on xarelto.  Pt states that he has had intermittent rectal bleeding since November that he believes may be related to hemorrhoids.  BRB per rectum this morning. Wife states "the toilet was full of blood".  Denies pain, denies NVD.  Normal BM this morning just with blood.

## 2014-11-16 ENCOUNTER — Ambulatory Visit (INDEPENDENT_AMBULATORY_CARE_PROVIDER_SITE_OTHER): Payer: Commercial Managed Care - HMO | Admitting: Internal Medicine

## 2014-11-16 ENCOUNTER — Encounter: Payer: Self-pay | Admitting: Internal Medicine

## 2014-11-16 ENCOUNTER — Other Ambulatory Visit (INDEPENDENT_AMBULATORY_CARE_PROVIDER_SITE_OTHER): Payer: Commercial Managed Care - HMO

## 2014-11-16 VITALS — BP 122/88 | HR 67 | Temp 98.1°F | Resp 16 | Ht 68.0 in | Wt 212.0 lb

## 2014-11-16 DIAGNOSIS — Z8679 Personal history of other diseases of the circulatory system: Secondary | ICD-10-CM | POA: Diagnosis not present

## 2014-11-16 DIAGNOSIS — K625 Hemorrhage of anus and rectum: Secondary | ICD-10-CM | POA: Diagnosis not present

## 2014-11-16 DIAGNOSIS — K602 Anal fissure, unspecified: Secondary | ICD-10-CM | POA: Diagnosis not present

## 2014-11-16 LAB — CBC WITH DIFFERENTIAL/PLATELET
Basophils Absolute: 0 10*3/uL (ref 0.0–0.1)
Basophils Relative: 0.4 % (ref 0.0–3.0)
Eosinophils Absolute: 0.1 10*3/uL (ref 0.0–0.7)
Eosinophils Relative: 1.1 % (ref 0.0–5.0)
HCT: 40.1 % (ref 39.0–52.0)
Hemoglobin: 14 g/dL (ref 13.0–17.0)
Lymphocytes Relative: 41 % (ref 12.0–46.0)
Lymphs Abs: 2.5 10*3/uL (ref 0.7–4.0)
MCHC: 34.8 g/dL (ref 30.0–36.0)
MCV: 96 fl (ref 78.0–100.0)
Monocytes Absolute: 0.6 10*3/uL (ref 0.1–1.0)
Monocytes Relative: 9.1 % (ref 3.0–12.0)
Neutro Abs: 3 10*3/uL (ref 1.4–7.7)
Neutrophils Relative %: 48.4 % (ref 43.0–77.0)
Platelets: 181 10*3/uL (ref 150.0–400.0)
RBC: 4.18 Mil/uL — ABNORMAL LOW (ref 4.22–5.81)
RDW: 14.1 % (ref 11.5–15.5)
WBC: 6.1 10*3/uL (ref 4.0–10.5)

## 2014-11-16 NOTE — Progress Notes (Signed)
   Subjective:    Patient ID: Todd [name], male    DOB: 06/09/1941, 74 y.o.   MRN: [id number]  HPI He was seen in the ER 3/[id number] for rectal bleeding. He states he's had intermittent rectal bleeding since April 2015 after Xarelto was initiated for A fib.  In the emergency room he was found to have a small external hemorrhoid & dilated internal hemorrhoid. Since the ER visit he's had some spotting of rectal blood and increased amounts with each bowel movement.  In the emergency room his hemoglobin was 13.5 and hematocrit 35.4. Platelet count was 150,000.  His last colonoscopy was March 2014. At that time diverticulosis was found. He has a half brother with colon cancer.  He was seen by the GI he 06/2014. His Gastroenterologist has retired.  He has no other GI or bleeding symptoms.     Review of Systems  Unexplained weight loss, abdominal pain, significant dyspepsia, dysphagia, melena or persistently small caliber stools are denied.  Epistaxis, hemoptysis or hematuria denied. There is no other abnormal bruising , bleeding, or difficulty stopping bleeding with injury.     Objective:   Physical Exam General appearance is one of good health and nourishment w/o distress.  Eyes: No conjunctival inflammation or scleral icterus is present.  Oral exam: Dental hygiene is good; lips and gums are healthy appearing.There is no oropharyngeal erythema or exudate noted.   Heart:  Normal rate and regular rhythm. S1 and S2 normal without gallop, murmur, click, rub or other extra sounds    Chest: slight gynecomastia.  Lungs:Chest clear to auscultation; no wheezes, rhonchi,rales ,or rubs present.No increased work of breathing.   Abdomen: bowel sounds normal, soft and non-tender without masses, or organomegaly .Ventral & umbilical hernias noted.  No guarding or rebound . No tenderness over the flanks to percussion  Musculoskeletal: Able to lie flat and sit up without help. Negative straight leg  raising bilaterally. Gait normal  Skin:Warm & dry.  Intact without suspicious lesions or rashes ; no jaundice or tenting  Lymphatic: No lymphadenopathy is noted about the head, neck, axilla.   There is prominent external hemorrhoidal tissue at 5 o'clock with a linear fissure which is not actively bleeding.              Assessment & Plan:  #1 hemorrhoidal bleeding in the context of a novel anticoagulant.Probable anal fissure  Plan: CBC and differential  GI referral  Consideration could be given to changing the Xarelto to Eliquis which has been reported to have possible lower risk of bleeding. This was reported @ the [employer] in [place], [place].I'll seek Dr [employer].

## 2014-11-16 NOTE — Patient Instructions (Signed)
  Cleanse rectal area with lather of mild shampoo in shower  as discussed. After bowel movement,use tissue to cleanse the bulk of stool & finish up with TUCKS  or Baby Wipes.  Sitz baths followed by the medication 2 to 3 times a day to shrink the hemorrhoids. Stay well hydrated and avoid popcorn and some other materials which might aggravate hemorrhoids.

## 2014-11-16 NOTE — Progress Notes (Signed)
Pre visit review using our clinic review tool, if applicable. No additional management support is needed unless otherwise documented below in the visit note. 

## 2014-11-17 ENCOUNTER — Telehealth: Payer: Self-pay | Admitting: Internal Medicine

## 2014-11-17 NOTE — Telephone Encounter (Signed)
Patient needs [facility] referral for Dr. Carlean Purl here at [facility].

## 2014-11-18 ENCOUNTER — Encounter: Payer: Self-pay | Admitting: Cardiology

## 2014-11-18 NOTE — Telephone Encounter (Signed)
done

## 2014-11-20 LAB — HM DIABETES EYE EXAM

## 2014-11-30 ENCOUNTER — Telehealth: Payer: Self-pay | Admitting: *Deleted

## 2014-11-30 NOTE — Telephone Encounter (Signed)
Dr.Clay Burton sent over clearance on patient.  Mr.Widrig is having a tooth extraction this Thursday 12/03/14. Please advise if/when patient should stop his blood thinner.

## 2014-12-01 NOTE — Telephone Encounter (Signed)
Information given to TARA -DR BURTON NOTIFIED WIFE -- HOLDING XARELTO FOR 2DAYS BOTH VERBALIZED UNDESRTANDING

## 2014-12-01 NOTE — Telephone Encounter (Signed)
Spoke to [place], she is calling to see how long to to hold Xarelto for tooth extraction  It  Is scheduled for Thursday 12/03/14 -in 2 days.  RN spoke to wife she states , patient is still taking Xarelto ,but has some bleeding from rectum. Dr Linna Darner schedule an appointment to see a GI doctor

## 2014-12-01 NOTE — Telephone Encounter (Signed)
OK to hold two days prior to procedure.

## 2014-12-07 ENCOUNTER — Encounter: Payer: Self-pay | Admitting: Internal Medicine

## 2014-12-07 ENCOUNTER — Ambulatory Visit (INDEPENDENT_AMBULATORY_CARE_PROVIDER_SITE_OTHER): Payer: Commercial Managed Care - HMO | Admitting: Internal Medicine

## 2014-12-07 ENCOUNTER — Ambulatory Visit (INDEPENDENT_AMBULATORY_CARE_PROVIDER_SITE_OTHER)
Admission: RE | Admit: 2014-12-07 | Discharge: 2014-12-07 | Disposition: A | Discharge status: Home / Self Care | Payer: Commercial Managed Care - HMO | Source: Ambulatory Visit | Attending: Internal Medicine | Admitting: Internal Medicine

## 2014-12-07 ENCOUNTER — Other Ambulatory Visit (INDEPENDENT_AMBULATORY_CARE_PROVIDER_SITE_OTHER): Payer: Commercial Managed Care - HMO

## 2014-12-07 VITALS — BP 140/90 | HR 73 | Temp 98.0°F | Resp 14 | Ht 68.0 in | Wt 211.2 lb

## 2014-12-07 DIAGNOSIS — E1159 Type 2 diabetes mellitus with other circulatory complications: Secondary | ICD-10-CM

## 2014-12-07 DIAGNOSIS — R0989 Other specified symptoms and signs involving the circulatory and respiratory systems: Secondary | ICD-10-CM

## 2014-12-07 DIAGNOSIS — K625 Hemorrhage of anus and rectum: Secondary | ICD-10-CM

## 2014-12-07 DIAGNOSIS — R0689 Other abnormalities of breathing: Secondary | ICD-10-CM

## 2014-12-07 DIAGNOSIS — E785 Hyperlipidemia, unspecified: Secondary | ICD-10-CM

## 2014-12-07 DIAGNOSIS — E1151 Type 2 diabetes mellitus with diabetic peripheral angiopathy without gangrene: Secondary | ICD-10-CM

## 2014-12-07 LAB — CBC WITH DIFFERENTIAL/PLATELET
Basophils Absolute: 0 10*3/uL (ref 0.0–0.1)
Basophils Relative: 0.4 % (ref 0.0–3.0)
Eosinophils Absolute: 0.1 10*3/uL (ref 0.0–0.7)
Eosinophils Relative: 1.6 % (ref 0.0–5.0)
HCT: 41.5 % (ref 39.0–52.0)
Hemoglobin: 14.3 g/dL (ref 13.0–17.0)
Lymphocytes Relative: 44.7 % (ref 12.0–46.0)
Lymphs Abs: 2.5 10*3/uL (ref 0.7–4.0)
MCHC: 34.4 g/dL (ref 30.0–36.0)
MCV: 96.2 fl (ref 78.0–100.0)
Monocytes Absolute: 0.5 10*3/uL (ref 0.1–1.0)
Monocytes Relative: 9.8 % (ref 3.0–12.0)
Neutro Abs: 2.4 10*3/uL (ref 1.4–7.7)
Neutrophils Relative %: 43.5 % (ref 43.0–77.0)
Platelets: 161 10*3/uL (ref 150.0–400.0)
RBC: 4.31 Mil/uL (ref 4.22–5.81)
RDW: 14.1 % (ref 11.5–15.5)
WBC: 5.6 10*3/uL (ref 4.0–10.5)

## 2014-12-07 LAB — TSH: TSH: 4.12 u[IU]/mL (ref 0.35–4.50)

## 2014-12-07 MED ORDER — GLIMEPIRIDE 2 MG PO TABS: 2.0000 mg | ORAL_TABLET | Freq: Every day | ORAL | Status: DC

## 2014-12-07 NOTE — Progress Notes (Signed)
Subjective:    Patient ID: Todd [name], male    DOB: May 04, 1941, 74 y.o.   MRN: [id number]  HPI He is here to assess status of active health conditions:  Diet/ nutrition:low salt ;decreased sugar Exercise program:physically active; no program per se  HYPERTENSION: Disease Monitoring: Blood pressure range/ average : 124/72 on average Medication Compliance:yes  Diabetes :  FBS range/average: 114-157 Highest 2 hr post meal glucose:no monitor Medication compliance:yes Hypoglycemia:no Ophthamology care:UTD; no retinopathy Podiatry care:not UTD  HYPERLIPIDEMIA: Disease Monitoring: Medication Compliance:yes    Review of Systems  Chest pain, palpitations: no      Dyspnea:no Edema:only @ sock line Claudication: no Lightheadedness,Syncope:rare Weight gain/loss:stable Polyuria/phagia/dipsia: no   Blurred vision /diplopia/lossof vision:no Limb numbness/tingling/burning:no Non healing skin lesions:no Abd pain, bowel changes:GI evaluation 01/07/15;no bleeding off Xarelto ;he  has not resumed Xarelto. Myalgias: no Memory loss:no      Objective:   Physical Exam  Gen.: Adequately nourished in appearance. Alert, appropriate and cooperative throughout exam. BMI:32.13 Appears younger than stated age  Head: Normocephalic without obvious abnormalities; no alopecia  Eyes: No corneal or conjunctival inflammation noted. Pupils equal round reactive to light and accommodation. Extraocular motion intact.  Ears: External  ear exam reveals no significant lesions or deformities. Hearing aids bilaterally. Nose: External nasal exam reveals no deformity or inflammation. Nasal mucosa are pink and moist. No lesions or exudates noted.   Mouth: Oral mucosa and oropharynx reveal no lesions or exudates. Teeth in good repair. Neck: No deformities, masses, or tenderness noted. Range of motion & Thyroid normal.. Lungs: Normal respiratory effort; chest expands symmetrically. Minor asymmetric  scattered rales,R > L.No  increased work of breathing. Heart: Distant heart sounds.Normal rate and rhythm. Normal S1 and S2. No gallop, click, or rub. No murmur. Abdomen: Bowel sounds normal; abdomen soft and nontender. No masses, organomegaly or hernias noted. Genitalia: ER 11/10/24: external hemorrhoid;some pinkish blood. PSA 0.24 in 2009.    Digital rectal exam repeated because of family history of prostate cancer in half brother. Small variceal in the left scrotum. No hemorrhoids visualized or palpated. Prostate is normal. No stool on exam finger.                           Musculoskeletal/extremities: No deformity or scoliosis noted of  the thoracic or lumbar spine.  No clubbing, cyanosis, or significant extremity  deformity noted.Trace sockline edema. Range of motion normal . Tone & strength normal. Hand joints normal Fingernail  health good. Crepitus of knees  Able to lie down & sit up w/o help.  Negative SLR bilaterally Vascular: Carotid, radial artery, dorsalis pedis and  posterior tibial pulses are full and equal. No bruits present. Neurologic: Alert and oriented x3. Deep tendon reflexes symmetrical and normal.  Gait normal       Skin: Intact without suspicious lesions or rashes. Lymph: No cervical, axillary, or inguinal lymphadenopathy present. Psych: Mood and affect are normal. Normally interactive                                                                                      Assessment & Plan:  See Current Assessment & Plan in Problem List under specific Diagnosis 

## 2014-12-07 NOTE — Assessment & Plan Note (Signed)
Glimiperide 2 mg qd Nutrition discussed A1c 3 mos

## 2014-12-07 NOTE — Patient Instructions (Addendum)
  Your next office appointment will be determined based upon review of your pending labs & xrays  Those instructions will be transmitted to you by My Chart   Critical results will be called.   Followup as needed for any active or acute issue. Please report any significant change in your symptoms.   The following nutritional changes may help prevent Diabetes progression & complications.  White carbohydrates (potatoes, rice, bread, and pasta) cause a high spike of the sugar level which stays elevated for a significant period of time (called sugar"load").  For example a  baked potato has a cup of sugar and a  french fry  2 teaspoons of sugar.  More complex carbs such as yams, wild  rice, whole grained bread &  wheat pasta have been much lower spike and persistent load of sugar than the white carbs. The pancreas excretes excess insulin in response to the high spike & load of sugar . Over time the pancreas can actually run out of insulin necessitating insulin shots.

## 2014-12-07 NOTE — Progress Notes (Signed)
Pre visit review using our clinic review tool, if applicable. No additional management support is needed unless otherwise documented below in the visit note. 

## 2014-12-07 NOTE — Assessment & Plan Note (Signed)
CBC

## 2014-12-07 NOTE — Assessment & Plan Note (Signed)
TSH 

## 2014-12-08 ENCOUNTER — Other Ambulatory Visit: Payer: Self-pay | Admitting: Internal Medicine

## 2014-12-08 DIAGNOSIS — E1151 Type 2 diabetes mellitus with diabetic peripheral angiopathy without gangrene: Secondary | ICD-10-CM

## 2014-12-10 ENCOUNTER — Ambulatory Visit (INDEPENDENT_AMBULATORY_CARE_PROVIDER_SITE_OTHER): Payer: Commercial Managed Care - HMO

## 2014-12-10 DIAGNOSIS — Z5181 Encounter for therapeutic drug level monitoring: Secondary | ICD-10-CM

## 2014-12-10 DIAGNOSIS — I4891 Unspecified atrial fibrillation: Secondary | ICD-10-CM

## 2014-12-10 NOTE — Patient Instructions (Signed)

## 2014-12-10 NOTE — Progress Notes (Signed)
Pt was started on Xarelto 20mg  by Dr Percival Spanish for afib on 11/04/2013.    Reviewed patients medication list.  Pt is not currently on any combined P-gp and strong CYP3A4 inhibitors/inducers (ketoconazole, traconazole, ritonavir, carbamazepine, phenytoin, rifampin, St. John's wort).  Reviewed labs.  SCr 0.86, Weight 95.8kg, CrCl- 106.75.  Dose appropriate based on CrCl.   Hgb and HCT Within Normal Limits 14.3 on 12/07/14.  Pt advised to resume taking Xarelto 20mg  QD.  Advised to recheck CBC and BMP in 6 months at cardiology [facility].

## 2014-12-28 ENCOUNTER — Encounter: Payer: Self-pay | Admitting: Internal Medicine

## 2014-12-29 ENCOUNTER — Other Ambulatory Visit: Payer: Self-pay | Admitting: Internal Medicine

## 2014-12-29 ENCOUNTER — Telehealth: Payer: Self-pay | Admitting: Internal Medicine

## 2014-12-29 DIAGNOSIS — M25512 Pain in left shoulder: Secondary | ICD-10-CM

## 2014-12-29 NOTE — Telephone Encounter (Signed)
Dx Left shoulder pain...Todd [name]

## 2014-12-29 NOTE — Telephone Encounter (Signed)
Patient has scheduled appointment with Dr. Tamala Julian on 5/26 at 2:30pm

## 2014-12-29 NOTE — Telephone Encounter (Signed)
[employer] referral completed. Auth # and dates are documented in the referral.

## 2014-12-29 NOTE — Telephone Encounter (Signed)
Requesting referral to see Dr. Tamala Julian for left shoulder pain.  Patient has [employer].

## 2014-12-29 NOTE — Telephone Encounter (Signed)
[employer] requires a dx for referral

## 2015-01-07 ENCOUNTER — Ambulatory Visit (INDEPENDENT_AMBULATORY_CARE_PROVIDER_SITE_OTHER): Payer: Commercial Managed Care - HMO | Admitting: Internal Medicine

## 2015-01-07 ENCOUNTER — Telehealth: Payer: Self-pay

## 2015-01-07 ENCOUNTER — Encounter: Payer: Self-pay | Admitting: Internal Medicine

## 2015-01-07 VITALS — BP 138/84 | HR 76 | Ht 68.0 in | Wt 213.1 lb

## 2015-01-07 DIAGNOSIS — Z7901 Long term (current) use of anticoagulants: Secondary | ICD-10-CM

## 2015-01-07 DIAGNOSIS — I480 Paroxysmal atrial fibrillation: Secondary | ICD-10-CM | POA: Diagnosis not present

## 2015-01-07 DIAGNOSIS — K648 Other hemorrhoids: Secondary | ICD-10-CM | POA: Diagnosis not present

## 2015-01-07 NOTE — Patient Instructions (Addendum)
Dr. Carlean Purl is going to contact Dr. Percival Spanish about holding your xarelto to do a hemorrhoid banding procedure.  We will be in touch about this.  Please reduce your time spent on the toilet.  I appreciate the opportunity to care for you. Silvano Rusk, M.D., [facility]

## 2015-01-07 NOTE — Telephone Encounter (Signed)
-----   Message from Gatha Mayer, MD sent at 01/07/2015  2:35 PM EDT ----- Regarding: banding 5/23 or 5/25 Ok to hold Xarelto from cardiology - can see in note from today  Ask patient if he wants to be banded next Mon or wed and work him in  Do not take Xarelto the day of banding and I will provide further instructions that day

## 2015-01-07 NOTE — Progress Notes (Signed)
Subjective:    Patient ID: Todd [name], male    DOB: 06-12-1941, 74 y.o.   MRN: [id number] Chief complaint is rectal bleeding HPI The patient has been having several months or more of rectal bleeding. He was actually seen by one of our advanced practitioners in the fall, treated for bleeding hemorrhoids, took hydrocortisone suppositories and says it didn't really help. He is on Xarelto for paroxysmal atrial fibrillation and he is at increased risk for stroke, so this is taken for stroke prophylaxis. When he has held his Xarelto for other reasons or at the direction of the emergency department because of the rectal bleeding, the bleeding seems to subside and probably go away completely.  He does not struggle to defecate as he takes a FiberCon pill and some Metamucil on some sort of intermittent scheduled basis. He does have multiple bowel movements so he spends about 15 minutes on the toilet in the morning until he is done defecating. His wife says he does word searches while he is in there. There is no rectal pain or difficulty passing a stool. He just does not like to get up and return to the bathroom because he knows over about a 15 minute time period he will have 2 or 3 stools.  Colonoscopy 2014 was negative for colorectal neoplasia.  Medications, allergies, past medical history, past surgical history, family history and social history are reviewed and updated in the EMR.    Review of Systems As per history of present illness    Objective:   Physical Exam @BP  138/84 mmHg  Pulse 76  Ht 5\' 8"  (1.727 m)  Wt 213 lb 2 oz (96.673 kg)  BMI 32.41 kg/m2@  General:  NAD  Rectal exam reveals a normal anoderm. There is no prolapse of hemorrhoidal tissue with Valsalva.  Digital exam reveals medium length anal canal no mass. There is slight brown stool.  Anoscopy is performed after verbal informed consent. It demonstrates grade 2 right anterior internal hemorrhoid in left lateral internal  hemorrhoid and a grade 1 right posterior internal hemorrhoid. They are inflamed and there is state Mott of bleeding.    Data Reviewed:    Cardiology notes, prior colonoscopy. His hemoglobin is normal in March and April.      Assessment & Plan:   1. Hemorrhoids, internal, with bleeding   2. Chronic anticoagulation with Xarelto   3. Paroxysmal atrial fibrillation    He seems like an appropriate candidate for hemorrhoidal banding. I've explained the risks benefits and indications and alternatives. I suspect the Xarelto is a culprit here but his chads score is such that it does not make sense to stop this long-term. He has seemed a bit puzzled because he is not in atrial fibrillation at this time but I explained the rationale.  I will query Dr. Percival Spanish, about ability to hold Xarelto for about a week total, just before the banding and then 5-7 days after. We will need to do this to maybe 3 times within a 3 month time. The risk of bleeding is with the ulceration that occurs after the banding.  I will call the patient and arrange banding when we sort this out.  He was advised to reduce time on the commode. He will continue his fiber supplementation.  I appreciate the opportunity to care for this patient. CC: [name], MD   ----- Message -----    From: Minus Breeding, MD    Sent: 01/07/2015  12:47 PM  To: Gatha Mayer, MD Subject: RE: hold xarelto?                              OK to hold as needed.    ----- Message -----    From: Gatha Mayer, MD    Sent: 01/07/2015  12:00 PM      To: Minus Breeding, MD Subject: hold xarelto?                                  This man has chronically bleeding hemorrhoids  He is an appropriate candidate for hemorrhoid banding  Would need to hold Xarelto day of banding and 5-7 d after. Is this reasonable in your opinion?  This would happen 2-3 x in 2-3 month period.  He has failed topical Tx and adequately treats constipation  etc.  Thanks  Glendell Docker

## 2015-01-07 NOTE — Telephone Encounter (Signed)
Spoke with wife who was in the room with her husband and she relayed the message.  We will work him in 01/13/15 at 3:15pm.  He takes his xarelto at bedtime and is aware he can take it 01/12/15 and that Dr Carlean Purl will advise how long to hold once he comes in.

## 2015-01-13 ENCOUNTER — Encounter: Payer: Self-pay | Admitting: Internal Medicine

## 2015-01-13 ENCOUNTER — Ambulatory Visit (INDEPENDENT_AMBULATORY_CARE_PROVIDER_SITE_OTHER): Payer: Commercial Managed Care - HMO | Admitting: Internal Medicine

## 2015-01-13 VITALS — BP 132/84 | HR 72 | Ht 66.5 in | Wt 214.1 lb

## 2015-01-13 DIAGNOSIS — K648 Other hemorrhoids: Secondary | ICD-10-CM | POA: Diagnosis not present

## 2015-01-13 NOTE — Progress Notes (Signed)
Patient ID: Todd [name], male   DOB: August 26, 1940, 74 y.o.   MRN: [id number]        PROCEDURE NOTE: The patient presents with symptomatic grade 2  hemorrhoids, requesting rubber band ligation of his/her hemorrhoidal disease.  All risks, benefits and alternative forms of therapy were described and informed consent was obtained.   The anorectum was pre-medicated with 0.125 % NTG and 5% lidocaine The decision was made to band the RA internal hemorrhoid, and the [place] was used to perform band ligation without complication.  Digital anorectal examination was then performed to assure proper positioning of the band, and to adjust the banded tissue as required.  The patient was discharged home without pain or other issues.  Dietary and behavioral recommendations were given and along with follow-up instructions.     The following adjunctive treatments were recommended:  Stay off Xarelto until May 31  The patient will return in June 23 for  follow-up and possible additional banding as required. No complications were encountered and the patient tolerated the procedure well.

## 2015-01-13 NOTE — Patient Instructions (Addendum)
   Stay off [place] until May 31 please.  Your next banding appointment will be June 23 at 2:45 PM.  Do not take Xarelto the night prior to your appointment since you take it at nighttime.  HEMORRHOID BANDING PROCEDURE    FOLLOW-UP CARE   1. The procedure you have had should have been relatively painless since the banding of the area involved does not have nerve endings and there is no pain sensation.  The rubber band cuts off the blood supply to the hemorrhoid and the band may fall off as soon as 48 hours after the banding (the band may occasionally be seen in the toilet bowl following a bowel movement). You may notice a temporary feeling of fullness in the rectum which should respond adequately to plain Tylenol or Motrin.  2. Following the banding, avoid strenuous exercise that evening and resume full activity the next day.  A sitz bath (soaking in a warm tub) or bidet is soothing, and can be useful for cleansing the area after bowel movements.     3. To avoid constipation, take two tablespoons of natural wheat bran, natural oat bran, flax, Benefiber or any over the counter fiber supplement and increase your water intake to 7-8 glasses daily.    4. Unless you have been prescribed anorectal medication, do not put anything inside your rectum for two weeks: No suppositories, enemas, fingers, etc.  5. Occasionally, you may have more bleeding than usual after the banding procedure.  This is often from the untreated hemorrhoids rather than the treated one.  Don't be concerned if there is a tablespoon or so of blood.  If there is more blood than this, lie flat with your bottom higher than your head and apply an ice pack to the area. If the bleeding does not stop within a half an hour or if you feel faint, call our office at [phone] or go to the emergency room.  6. Problems are not common; however, if there is a substantial amount of bleeding, severe pain, chills, fever or difficulty  passing urine (very rare) or other problems, you should call us at [phone] or report to the nearest emergency room.  7. Do not stay seated continuously for more than 2-3 hours for a day or two after the procedure.  Tighten your buttock muscles 10-15 times every two hours and take 10-15 deep breaths every 1-2 hours.  Do not spend more than a few minutes on the toilet if you cannot empty your bowel; instead re-visit the toilet at a later time.      I appreciate the opportunity to care for you. Silvano Rusk, M.D., [facility]

## 2015-01-14 ENCOUNTER — Encounter: Payer: Self-pay | Admitting: Family Medicine

## 2015-01-14 ENCOUNTER — Ambulatory Visit (INDEPENDENT_AMBULATORY_CARE_PROVIDER_SITE_OTHER): Payer: Commercial Managed Care - HMO | Admitting: Family Medicine

## 2015-01-14 ENCOUNTER — Other Ambulatory Visit (INDEPENDENT_AMBULATORY_CARE_PROVIDER_SITE_OTHER): Payer: Commercial Managed Care - HMO

## 2015-01-14 VITALS — BP 124/72 | HR 76 | Ht 68.0 in | Wt 215.0 lb

## 2015-01-14 DIAGNOSIS — M25512 Pain in left shoulder: Secondary | ICD-10-CM

## 2015-01-14 DIAGNOSIS — M7552 Bursitis of left shoulder: Secondary | ICD-10-CM | POA: Diagnosis not present

## 2015-01-14 NOTE — Assessment & Plan Note (Signed)
Patient given an injection today. We discussed icing regimen and home exercises. We discussed with potential activities to potentially avoid. Patient will start to increase his activity. Patient work with Product/process development scientist today to learn exercises in greater detail. Patient and will come back and see me again in 3-4 weeks for further evaluation and treatment.

## 2015-01-14 NOTE — Progress Notes (Signed)
[facility] [address] [place], [place] [address] Phone: [phone] Subjective:    I'm seeing this patient by the request  of:  [name], MD   CC: Left shoulder pain  EPP:IRJJOACZYS Todd [name] is a 74 y.o. male coming in with complaint of left shoulder pain. Patient has had this pain intimately for approximately 1 year. Patient states recently and has gotten worse when he started to try to lift weights on a regular basis. Patient states since then he is having more of a dull aching throbbing sensation that hurts more with a sharp pain that can last minutes. Patient states it seems to be more with certain movements. If he lays on that side at night it can wake him up. Denies any significant radiation down the arm or any severe neck pain associated with it. Patient rates the severity of pain a 6 out of 10 but can be as bad is 8 out of 10 with the sharp pain. Denies any fevers, chills, or any chest pain associated with it.     Past Medical History  Diagnosis Date  . HLD (hyperlipidemia)   . HTN (hypertension)   . Allergic rhinitis   . Nephrolithiasis   . PVD (peripheral vascular disease)     Carotid stenosis  . Stroke   . Asthma   . Diabetes mellitus   . CAD (coronary artery disease)     a. 1999; s/p CABG: LIMA to LAD, SVG to OM1, SVG to OM2, SVG to RCA  b. Normal nuc 10/2013 (done because of new onset AF.)  . Atrial fibrillation with rapid ventricular response     a. newly diagnosed 11/03/13, spont conv to NSR, placed on xarelto.  Marland Kitchen Overweight(278.02)    Past Surgical History  Procedure Laterality Date  . Colonoscopy w/ polypectomy  2009    Dr Sharlett Iles  . 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 Carotid endarterectomy  1999  . Nasal sinus surgery    . Colonoscopy  2014    negative;Dr Sharlett Iles   History   Social History  . Marital Status: Married    Spouse Name: [name]  . Number of Children: 2  . Years of Education: N/A    Occupational History  . Sales Manager-retired    Social History Main Topics  . Smoking status: Never Smoker   . Smokeless tobacco: Never Used  . Alcohol Use: No  . Drug Use: No  . Sexual Activity: Not on file   Other Topics Concern  . Not on file   Social History Narrative   Allergies  Allergen Reactions  . Amlodipine Besylate     Rash Because of a history of documented adverse serious drug reaction;Medi Alert bracelet  is recommended  . Ivp Dye [Iodinated Diagnostic Agents]     Rash Because of a history of documented adverse serious drug reaction;Medi Alert bracelet  is recommended   Family History  Problem Relation Age of Onset  . Asthma Sister   . Prostate cancer Brother   . Hemochromatosis Brother   . Heart attack Mother 8  . Colon cancer Brother 63  . Heart attack Brother 67  . Diabetes Father     IDDM  . Stroke Father 39  . Heart attack Brother 44     Past medical history, social, surgical and family history all reviewed in electronic medical record.   Review of Systems: No headache, visual changes, nausea, vomiting, diarrhea, constipation, dizziness, abdominal  pain, skin rash, fevers, chills, night sweats, weight loss, swollen lymph nodes, body aches, joint swelling, muscle aches, chest pain, shortness of breath, mood changes.   Objective Blood pressure 124/72, pulse 76, height 5\' 8"  (1.727 m), weight 215 lb (97.523 kg), SpO2 94 %.  General: No apparent distress alert and oriented x3 mood and affect normal, dressed appropriately.  HEENT: Pupils equal, extraocular movements intact  Respiratory: Patient's speak in full sentences and does not appear short of breath  Cardiovascular: No lower extremity edema, non tender, no erythema  Skin: Warm dry intact with no signs of infection or rash on extremities or on axial skeleton.  Abdomen: Soft nontender  Neuro: Cranial nerves II through XII are intact, neurovascularly intact in all extremities with 2+ DTRs and  2+ pulses.  Lymph: No lymphadenopathy of posterior or anterior cervical chain or axillae bilaterally.  Gait normal with good balance and coordination.  MSK:  Non tender with full range of motion and good stability and symmetric strength and tone of  elbows, wrist, hip, knee and ankles bilaterally.  Shoulder: left Inspection reveals no abnormalities, atrophy or asymmetry. Palpation is normal with no tenderness over AC joint or bicipital groove. ROM is full in all planes passively. Rotator cuff strength normal throughout. signs of impingement with positive Neer and Hawkin's tests, but negative empty can sign. Speeds and Yergason's tests normal. No labral pathology noted with negative Obrien's, negative clunk and good stability. Normal scapular function observed. No painful arc and no drop arm sign. No apprehension sign  MSK US performed of: left This study was ordered, performed, and interpreted by Charlann Boxer D.O.  Shoulder:   Supraspinatus:  Appears normal on long and transverse views, Bursal bulge seen with shoulder abduction on impingement view. Infraspinatus:  Appears normal on long and transverse views. Significant increase in Doppler flow Subscapularis:  Appears normal on long and transverse views. Positive bursa Teres Minor:  Appears normal on long and transverse views. AC joint:  Capsule undistended, no geyser sign. Glenohumeral Joint:  Appears normal without effusion. Glenoid Labrum:  Intact without visualized tears. Biceps Tendon:  Appears normal on long and transverse views, no fraying of tendon, tendon located in intertubercular groove, no subluxation with shoulder internal or external rotation.  Impression: Subacromial bursitis mild degenerative changes of rotator cuff.  Procedure: Real-time Ultrasound Guided Injection of left glenohumeral joint Device: GE Logiq E  Ultrasound guided injection is preferred based studies that show increased duration, increased effect,  greater accuracy, decreased procedural pain, increased response rate with ultrasound guided versus blind injection.  Verbal informed consent obtained.  Time-out conducted.  Noted no overlying erythema, induration, or other signs of local infection.  Skin prepped in a sterile fashion.  Local anesthesia: Topical Ethyl chloride.  With sterile technique and under real time ultrasound guidance:  Joint visualized.  23g 1  inch needle inserted posterior approach. Pictures taken for needle placement. Patient did have injection of 2 cc of 1% lidocaine, 2 cc of 0.5% Marcaine, and 1.0 cc of Kenalog 40 mg/dL. Completed without difficulty  Pain immediately resolved suggesting accurate placement of the medication.  Advised to call if fevers/chills, erythema, induration, drainage, or persistent bleeding.  Images permanently stored and available for review in the ultrasound unit.  Impression: Technically successful ultrasound guided injection.  Procedure note 62947; 15 minutes spent for Therapeutic exercises as stated in above notes.  This included exercises focusing on stretching, strengthening, with significant focus on eccentric aspects.  - Shoulder Exercises that included:  Basic scapular stabilization to include adduction and depression of scapula Scaption, focusing on proper movement and good control Internal and External rotation utilizing a theraband, with elbow tucked at side entire time Rows with theraband Proper technique shown and discussed handout in great detail with ATC.  All questions were discussed and answered.     Impression and Recommendations:     This case required medical decision making of moderate complexity.

## 2015-01-14 NOTE — Progress Notes (Signed)
Pre visit review using our clinic review tool, if applicable. No additional management support is needed unless otherwise documented below in the visit note. 

## 2015-01-14 NOTE — Patient Instructions (Signed)
Good to see you Ice 20 minutes 2 times daily. Usually after activity and before bed. Exercises 3 times a week.  pennsaid pinkie amount topically 2 times daily as needed.  See me again in 3 weeks.  

## 2015-01-15 ENCOUNTER — Encounter: Payer: Self-pay | Admitting: Internal Medicine

## 2015-01-15 ENCOUNTER — Ambulatory Visit (INDEPENDENT_AMBULATORY_CARE_PROVIDER_SITE_OTHER): Payer: Commercial Managed Care - HMO

## 2015-01-15 VITALS — BP 120/70 | Ht 68.0 in | Wt 212.2 lb

## 2015-01-15 DIAGNOSIS — K648 Other hemorrhoids: Secondary | ICD-10-CM | POA: Insufficient documentation

## 2015-01-15 DIAGNOSIS — Z0000 Encounter for general adult medical examination without abnormal findings: Secondary | ICD-10-CM

## 2015-01-15 DIAGNOSIS — Z23 Encounter for immunization: Secondary | ICD-10-CM | POA: Diagnosis not present

## 2015-01-15 NOTE — Patient Instructions (Addendum)
Todd [name] , Thank you for taking time to come for your Medicare Wellness Visit. I appreciate your ongoing commitment to your health goals. Please review the following plan we discussed and let me know if I can assist you in the future.   Prevnar 13 given this visit [facility] and will walk with her post surgery. Will review Mediterranean diet with spouse Can visit Agricultural consultant program for teeth cleaning.   These are the goals we discussed: Goals    . <enter goal here>     Hx of exercise in gym; Walks out in yard Goal weight is 190;  Continue goal of no white; plant fat       This is a list of the screening recommended for you and due dates:  Health Maintenance  Topic Date Due  . Shingles Vaccine  01/28/2003  . Pneumonia vaccines (2 of 2 - PCV13) 05/06/2011  . Complete foot exam   12/06/2014  . Flu Shot  03/22/2015  . Hemoglobin A1C  04/24/2015  . Urine Protein Check  10/22/2015  . Eye exam for diabetics  11/20/2015  . Colon Cancer Screening  11/11/2017  . Tetanus Vaccine  12/04/2022      Health Maintenance A healthy lifestyle and preventative care can promote health and wellness.  Maintain regular health, dental, and eye exams.  Eat a healthy diet. Foods like vegetables, fruits, whole grains, low-fat dairy products, and lean protein foods contain the nutrients you need and are low in calories. Decrease your intake of foods high in solid fats, added sugars, and salt. Get information about a proper diet from your health care provider, if necessary.  Regular physical exercise is one of the most important things you can do for your health. Most adults should get at least 150 minutes of moderate-intensity exercise (any activity that increases your heart rate and causes you to sweat) each week. In addition, most adults need muscle-strengthening exercises on 2 or more days a week.   Maintain a healthy weight. The body mass index (BMI) is a  screening tool to identify possible weight problems. It provides an estimate of body fat based on height and weight. Your health care provider can find your BMI and can help you achieve or maintain a healthy weight. For males 20 years and older:  A BMI below 18.5 is considered underweight.  A BMI of 18.5 to 24.9 is normal.  A BMI of 25 to 29.9 is considered overweight.  A BMI of 30 and above is considered obese.  Maintain normal blood lipids and cholesterol by exercising and minimizing your intake of saturated fat. Eat a balanced diet with plenty of fruits and vegetables. Blood tests for lipids and cholesterol should begin at age 74 and be repeated every 5 years. If your lipid or cholesterol levels are high, you are over age 22, or you are at high risk for heart disease, you may need your cholesterol levels checked more frequently.Ongoing high lipid and cholesterol levels should be treated with medicines if diet and exercise are not working.  If you smoke, find out from your health care provider how to quit. If you do not use tobacco, do not start.  Lung cancer screening is recommended for adults aged 42-80 years who are at high risk for developing lung cancer because of a history of smoking. A yearly low-dose CT scan of the lungs is recommended for people who have at least a 30-pack-year history  of smoking and are current smokers or have quit within the past 15 years. A pack year of smoking is smoking an average of 1 pack of cigarettes a day for 1 year (for example, a 30-pack-year history of smoking could mean smoking 1 pack a day for 30 years or 2 packs a day for 15 years). Yearly screening should continue until the smoker has stopped smoking for at least 15 years. Yearly screening should be stopped for people who develop a health problem that would prevent them from having lung cancer treatment.  If you choose to drink alcohol, do not have more than 2 drinks per day. One drink is considered to be  12 oz (360 mL) of beer, 5 oz (150 mL) of wine, or 1.5 oz (45 mL) of liquor.  Avoid the use of street drugs. Do not share needles with anyone. Ask for help if you need support or instructions about stopping the use of drugs.  High blood pressure causes heart disease and increases the risk of stroke. Blood pressure should be checked at least every 1-2 years. Ongoing high blood pressure should be treated with medicines if weight loss and exercise are not effective.  If you are 37-31 years old, ask your health care provider if you should take aspirin to prevent heart disease.  Diabetes screening involves taking a blood sample to check your fasting blood sugar level. This should be done once every 3 years after age 15 if you are at a normal weight and without risk factors for diabetes. Testing should be considered at a younger age or be carried out more frequently if you are overweight and have at least 1 risk factor for diabetes.  Colorectal cancer can be detected and often prevented. Most routine colorectal cancer screening begins at the age of 44 and continues through age 53. However, your health care provider may recommend screening at an earlier age if you have risk factors for colon cancer. On a yearly basis, your health care provider may provide home test kits to check for hidden blood in the stool. A small camera at the end of a tube may be used to directly examine the colon (sigmoidoscopy or colonoscopy) to detect the earliest forms of colorectal cancer. Talk to your health care provider about this at age 69 when routine screening begins. A direct exam of the colon should be repeated every 5-10 years through age 41, unless early forms of precancerous polyps or small growths are found.  People who are at an increased risk for hepatitis B should be screened for this virus. You are considered at high risk for hepatitis B if:  You were born in a country where hepatitis B occurs often. Talk with your  health care provider about which countries are considered high risk.  Your parents were born in a high-risk country and you have not received a shot to protect against hepatitis B (hepatitis B vaccine).  You have HIV or AIDS.  You use needles to inject street drugs.  You live with, or have sex with, someone who has hepatitis B.  You are a man who has sex with other men (MSM).  You get hemodialysis treatment.  You take certain medicines for conditions like cancer, organ transplantation, and autoimmune conditions.  Hepatitis C blood testing is recommended for all people born from [birth year] through 1963 and any individual with known risk factors for hepatitis C.  Healthy men should no longer receive prostate-specific antigen (PSA) blood tests as part  of routine cancer screening. Talk to your health care provider about prostate cancer screening.  Testicular cancer screening is not recommended for adolescents or adult males who have no symptoms. Screening includes self-exam, a health care provider exam, and other screening tests. Consult with your health care provider about any symptoms you have or any concerns you have about testicular cancer.  Practice safe sex. Use condoms and avoid high-risk sexual practices to reduce the spread of sexually transmitted infections (STIs).  You should be screened for STIs, including gonorrhea and chlamydia if:  You are sexually active and are younger than 24 years.  You are older than 24 years, and your health care provider tells you that you are at risk for this type of infection.  Your sexual activity has changed since you were last screened, and you are at an increased risk for chlamydia or gonorrhea. Ask your health care provider if you are at risk.  If you are at risk of being infected with HIV, it is recommended that you take a prescription medicine daily to prevent HIV infection. This is called pre-exposure prophylaxis (PrEP). You are considered at  risk if:  You are a man who has sex with other men (MSM).  You are a heterosexual man who is sexually active with multiple partners.  You take drugs by injection.  You are sexually active with a partner who has HIV.  Talk with your health care provider about whether you are at high risk of being infected with HIV. If you choose to begin PrEP, you should first be tested for HIV. You should then be tested every 3 months for as long as you are taking PrEP.  Use sunscreen. Apply sunscreen liberally and repeatedly throughout the day. You should seek shade when your shadow is shorter than you. Protect yourself by wearing long sleeves, pants, a wide-brimmed hat, and sunglasses year round whenever you are outdoors.  Tell your health care provider of new moles or changes in moles, especially if there is a change in shape or color. Also, tell your health care provider if a mole is larger than the size of a pencil eraser.  A one-time screening for abdominal aortic aneurysm (AAA) and surgical repair of large AAAs by ultrasound is recommended for men aged 55-75 years who are current or former smokers.  Stay current with your vaccines (immunizations). Document Released: 02/03/2008 Document Revised: 08/12/2013 Document Reviewed: 01/02/2011 [facility] Patient Information 2015 [place], [place]. This information is not intended to replace advice given to you by your health care provider. Make sure you discuss any questions you have with your health care provider.

## 2015-01-15 NOTE — Progress Notes (Signed)
Medical screening examination/treatment/procedure(s) were performed by non-physician practitioner and as supervising physician I was immediately available for consultation/collaboration. I agree with above. William Hopper, MD   

## 2015-01-15 NOTE — Assessment & Plan Note (Signed)
RA banded - hold xarelto x 5 d RTC June off Xarelto day before - repeat bading anticipated

## 2015-01-15 NOTE — Progress Notes (Signed)
Subjective:   Todd [name] is a 74 y.o. male who presents for Medicare Annual/Subsequent preventive examination.  Review of Systems:  HRA assessment completed during visit;  Patient is here for Annual Wellness Assessment  The patient describes their health better, the same or worse than last year? Good  Reviewed: BMI: > 30 discussed increased risk /  Hx Cabg in 99 with 4 stents; exercise and lifted weights up to that time. Now walks a lot keeping yards and mowing;  Has full gym at home; but does not lift as much; has just had steroid injection to right shoulder Diet: states he does not count carbs; but does not eat anything "white" or sugar. Would like to  lose some weight; would like to be around 200 Use of tobacco; alcohol or illicit drugs: no Labs: last a1c over 8, but had just taken shot. States his bs now is running 120 in the am. Asked to check post-prandial periodically and will do so. States the doctor did start him on another med and it is doing better Wants to get his a1c down; Discussed the Mediterranean diet, which is what he enjoys   Psychosocial support; safe community; firearm safety; smoke alarms;  Caregiver to other? no  Goals: States he will do better as he knows what he needs to do to lose. Both the patient and spouse at today's assessment and both agreed to start exercising and walking and [facility]  Discussed Goal to improve health based on risk   Screenings; Colonoscopy; completed EKG deferred to cardiology  Vision: completed Hearing: hearing aids both ears Dental: postponed due to cost; educated on Chartered certified accountant program and the need to have teeth cleaned  Gave information on safety to take home;   Current Care Team reviewed and updated  Dr. Percival Spanish, Jeneen Rinks    Cardiac Risk Factors include: advanced age (>95mn, >>71women);diabetes mellitus;dyslipidemia;family history of premature cardiovascular  disease;hypertension;male gender;obesity (BMI >30kg/m2) (Understand risk)     Objective:    Vitals: BP 120/70 mmHg  Ht 5' 8"  (1.727 m)  Wt 212 lb 4 oz (96.276 kg)  BMI 32.28 kg/m2  Tobacco History  Smoking status  . Never Smoker   Smokeless tobacco  . Never Used     Counseling given: Yes   Past Medical History  Diagnosis Date  . HLD (hyperlipidemia)   . HTN (hypertension)   . Allergic rhinitis   . Nephrolithiasis   . PVD (peripheral vascular disease)     Carotid stenosis  . Stroke   . Asthma   . Diabetes mellitus   . CAD (coronary artery disease)     a. 1999; s/p CABG: LIMA to LAD, SVG to OM1, SVG to OM2, SVG to RCA  b. Normal nuc 10/2013 (done because of new onset AF.)  . Atrial fibrillation with rapid ventricular response     a. newly diagnosed 11/03/13, spont conv to NSR, placed on xarelto.  .Marland KitchenOverweight(278.02)    Past Surgical History  Procedure Laterality Date  . Colonoscopy w/ polypectomy  2009    Dr PSharlett Iles . 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 Carotid endarterectomy  1999  . Nasal sinus surgery    . Colonoscopy  2014    negative;Dr PSharlett Iles  Family History  Problem Relation Age of Onset  . Asthma Sister   . Prostate cancer Brother   . Hemochromatosis Brother   . Heart attack Mother 572 .  Colon cancer Brother 21  . Heart attack Brother 94  . Diabetes Father     IDDM  . Stroke Father 53  . Heart attack Brother 3   History  Sexual Activity  . Sexual Activity: Not on file    Outpatient Encounter Prescriptions as of 01/15/2015  Medication Sig  . ACCU-CHEK FASTCLIX LANCETS MISC Use to check blood sugars twice a day Dx E11.59  . ACCU-CHEK SMARTVIEW test strip 1 each by Other route daily.   . Blood Glucose Monitoring Suppl (ACCU-CHEK NANO SMARTVIEW) W/DEVICE KIT 1 Device by Does not apply route as needed.  . cetirizine (ZYRTEC) 10 MG tablet Take 10 mg by mouth daily as needed for allergies.   . Cholecalciferol (VITAMIN D-3) 5000 UNITS  TABS Take 5,000 Units by mouth daily.   . fish oil-omega-3 fatty acids 1000 MG capsule Take 1 g by mouth 2 (two) times daily.   Marland Kitchen glimepiride (AMARYL) 2 MG tablet Take 1 tablet (2 mg total) by mouth daily before breakfast.  . GLUCOSAMINE CHONDROITIN COMPLX PO Take 2 tablets by mouth daily. Pt takes at night  . glucose 4 GM chewable tablet Chew 1 tablet by mouth as needed for low blood sugar.  . metFORMIN (GLUCOPHAGE-XR) 500 MG 24 hr tablet Take 1 tablet (500 mg total) by mouth 2 (two) times daily.  . metoprolol (LOPRESSOR) 50 MG tablet Take 1 tablet (50 mg total) by mouth 2 (two) times daily.  . nitroGLYCERIN (NITROSTAT) 0.4 MG SL tablet Place 1 tablet (0.4 mg total) under the tongue every 5 (five) minutes as needed for chest pain.  Marland Kitchen omeprazole (PRILOSEC) 20 MG capsule Take 1 capsule (20 mg total) by mouth 2 (two) times daily. (Patient taking differently: Take 20 mg by mouth daily. )  . polycarbophil (FIBERCON) 625 MG tablet Take 625 mg by mouth 2 (two) times daily.   . Probiotic Product (PROBIOTIC DAILY PO) Take 1 tablet by mouth daily.  . simvastatin (ZOCOR) 40 MG tablet Take 1 tablet (40 mg total) by mouth at bedtime. (Patient taking differently: Take 40 mg by mouth every evening. )  . XARELTO 20 MG TABS tablet Take 20 mg by mouth daily with supper.    No facility-administered encounter medications on file as of 01/15/2015.    Activities of Daily Living In your present state of health, do you have any difficulty performing the following activities: 01/15/2015 09/09/2014  Hearing? N N  Vision? N Y  Difficulty concentrating or making decisions? N N  Walking or climbing stairs? N N  Dressing or bathing? N N  Doing errands, shopping? N N  Preparing Food and eating ? N -  Using the Toilet? N -  In the past six months, have you accidently leaked urine? N -  Do you have problems with loss of bowel control? N -  Managing your Medications? N -  Managing your Finances? N -  Housekeeping or  managing your Housekeeping? N -    Patient Care Team: Hendricks Limes, MD as PCP - General   Assessment:    Objective:  Advanced Directives; completed  Discussed risk for CVD; DM as appropriate  Reviewed  risk and lifestyle choices;   Functional status; Safety Issues identified Depression; no  Personalized Education given regarding: regarding diabetes and diet; exercise  Pt determined a personalized goal / Weight 200 Assessment included: Calcium and Vit D as appropriate/ Osteoporosis risk reviewed Taking meds without issues; no barriers identified Stress: Recommendations for managing stress if  assessed as a factor;  No Risk for hepatitis or high risk social behavior identified via hepatitis screen Educated on shingles and follow up with insurance company for co-pays or charges applied to Part D benefit.  Safety issues reviewed(driving issues; vision; home environment; support network and environmental safety; gun safety; smoke detectors)  Cognition assessed by AD8; Score 0 (A score of 2 or greater would indicate the MMSE be completed)    Need for Immunizations or other screenings identified;  (CDC recommmend Prevnar at 65 followed by pnuemovax 23 in one year or 5 years after the last dose. Prevnar given       Exercise Activities and Dietary recommendations Current Exercise Habits:: Home exercise routine, Type of exercise: walking;strength training/weights, Time (Minutes): 60, Frequency (Times/Week): 4, Weekly Exercise (Minutes/Week): 240, Intensity: Moderate  Goals    . <enter goal here>     Hx of exercise in gym; Walks out in yard Goal weight is 190;  Continue goal of no white; plant fat      Fall Risk Fall Risk  01/15/2015 09/09/2014 12/03/2012  Falls in the past year? No No No   Depression Screen PHQ 2/9 Scores 01/15/2015 09/09/2014 12/03/2012  PHQ - 2 Score 0 0 0    Cognitive Testing MMSE - Mini Mental State Exam 01/15/2015  Not completed: Unable to  complete    Immunization History  Administered Date(s) Administered  . Influenza Split 07/06/2011, 05/15/2012  . Influenza Whole 05/12/2010  . Influenza,inj,Quad PF,36+ Mos 05/21/2013  . Influenza-Unspecified 05/20/2014  . Pneumococcal Polysaccharide-23 05/22/2005, 05/05/2010  . Tdap 12/03/2012  . Zoster 10/20/2010   Screening Tests Health Maintenance  Topic Date Due  . ZOSTAVAX  01/28/2003  . PNA vac Low Risk Adult (2 of 2 - PCV13) 05/06/2011  . FOOT EXAM  12/06/2014  . INFLUENZA VACCINE  03/22/2015  . HEMOGLOBIN A1C  04/24/2015  . URINE MICROALBUMIN  10/22/2015  . OPHTHALMOLOGY EXAM  11/20/2015  . COLONOSCOPY  11/11/2017  . TETANUS/TDAP  12/04/2022      Plan:     Plan   The patient agrees to: walk with spouse after she recovers from hip replacement; also agrees  Advanced directive:  Discuss with Doctor on next fup:  Will have a1c rechecked Will attempt weight loss;  [facility] post spouse recovery from hip surgery    During the course of the visit the patient was educated and counseled about the following appropriate screening and preventive services:   Vaccines to include Pneumoccal, Influenza, Hepatitis B, Td, Zostavax, HCV/ prevnar given  Electrocardiogram; deferred   Cardiovascular Disease; deferred to cardiology  Colorectal cancer screening; not due  Diabetes screening/ ongoing  Prostate Cancer Screening/ deferred  Glaucoma screening/ completed  Nutrition counseling /given Patient Instructions (the written plan) was given to the patient.    Wynetta Fines, RN  01/15/2015

## 2015-02-11 ENCOUNTER — Ambulatory Visit (INDEPENDENT_AMBULATORY_CARE_PROVIDER_SITE_OTHER): Payer: Commercial Managed Care - HMO | Admitting: Family Medicine

## 2015-02-11 ENCOUNTER — Encounter: Payer: Self-pay | Admitting: Internal Medicine

## 2015-02-11 ENCOUNTER — Ambulatory Visit (INDEPENDENT_AMBULATORY_CARE_PROVIDER_SITE_OTHER): Payer: Commercial Managed Care - HMO | Admitting: Internal Medicine

## 2015-02-11 ENCOUNTER — Telehealth: Payer: Self-pay

## 2015-02-11 ENCOUNTER — Encounter: Payer: Self-pay | Admitting: Family Medicine

## 2015-02-11 VITALS — BP 134/80 | HR 72 | Ht 66.5 in | Wt 207.2 lb

## 2015-02-11 VITALS — BP 122/80 | HR 75 | Ht 68.0 in | Wt 208.0 lb

## 2015-02-11 DIAGNOSIS — M7552 Bursitis of left shoulder: Secondary | ICD-10-CM | POA: Diagnosis not present

## 2015-02-11 DIAGNOSIS — K648 Other hemorrhoids: Secondary | ICD-10-CM

## 2015-02-11 NOTE — Telephone Encounter (Signed)
Spoke with [name] (wife) and gave her the options.  They want to come today so we will leave him on for banding.

## 2015-02-11 NOTE — Patient Instructions (Signed)
Good to see you Pennsaid still 2 times a day Ice 20 minutes 2 times daily. Usually after activity and before bed. Continue the exercises.  See me again 6 weeks if not perfect

## 2015-02-11 NOTE — Progress Notes (Signed)
Pre visit review using our clinic review tool, if applicable. No additional management support is needed unless otherwise documented below in the visit note. 

## 2015-02-11 NOTE — Progress Notes (Signed)
   Improved less/no bleeding after RA banding 12/2014  PROCEDURE NOTE: The patient presents with symptomatic grade 2 hemorrhoids, requesting rubber band ligation of his/her hemorrhoidal disease.  All risks, benefits and alternative forms of therapy were described and informed consent was obtained.   The anorectum was pre-medicated with NTG and lidocaine The decision was made to band the LL internal hemorrhoid, and the [place] was used to perform band ligation without complication.  Digital anorectal examination was then performed to assure proper positioning of the band, and to adjust the banded tissue as required.  The patient was discharged home without pain or other issues.  Dietary and behavioral recommendations were given and along with follow-up instructions.     The following adjunctive treatments were recommended:  Do not restart Xarelto until 6/29  The patient will return prn for  follow-up and possible additional banding as required. No complications were encountered and the patient tolerated the procedure well.

## 2015-02-11 NOTE — Assessment & Plan Note (Signed)
No bleeding since first banding LL banded today Hold Xarelto until 6/29 See me prn

## 2015-02-11 NOTE — Assessment & Plan Note (Signed)
Patient is improving at this time. Encourage patient to continue to conservative therapy including icing, home exercises, and patient was given more topical anti-inflammatory's. Patient come back and see me again in 6 weeks. At that time if continuing him trouble we will consider repeat injection.

## 2015-02-11 NOTE — Patient Instructions (Addendum)
HEMORRHOID BANDING PROCEDURE    FOLLOW-UP CARE   1. The procedure you have had should have been relatively painless since the banding of the area involved does not have nerve endings and there is no pain sensation.  The rubber band cuts off the blood supply to the hemorrhoid and the band may fall off as soon as 48 hours after the banding (the band may occasionally be seen in the toilet bowl following a bowel movement). You may notice a temporary feeling of fullness in the rectum which should respond adequately to plain Tylenol or Motrin.  2. Following the banding, avoid strenuous exercise that evening and resume full activity the next day.  A sitz bath (soaking in a warm tub) or bidet is soothing, and can be useful for cleansing the area after bowel movements.     3. To avoid constipation, take two tablespoons of natural wheat bran, natural oat bran, flax, Benefiber or any over the counter fiber supplement and increase your water intake to 7-8 glasses daily.    4. Unless you have been prescribed anorectal medication, do not put anything inside your rectum for two weeks: No suppositories, enemas, fingers, etc.  5. Occasionally, you may have more bleeding than usual after the banding procedure.  This is often from the untreated hemorrhoids rather than the treated one.  Don't be concerned if there is a tablespoon or so of blood.  If there is more blood than this, lie flat with your bottom higher than your head and apply an ice pack to the area. If the bleeding does not stop within a half an hour or if you feel faint, call our office at [phone] or go to the emergency room.  6. Problems are not common; however, if there is a substantial amount of bleeding, severe pain, chills, fever or difficulty passing urine (very rare) or other problems, you should call us at [phone] or report to the nearest emergency room.  7. Do not stay seated continuously for more than 2-3 hours for a day or two  after the procedure.  Tighten your buttock muscles 10-15 times every two hours and take 10-15 deep breaths every 1-2 hours.  Do not spend more than a few minutes on the toilet if you cannot empty your bowel; instead re-visit the toilet at a later time.    Follow up with Dr Carlean Purl as needed.   Re-start the xarelto June 29th 2016 per Dr Carlean Purl.     I appreciate the opportunity to care for you. Silvano Rusk, MD, [facility]

## 2015-02-11 NOTE — Telephone Encounter (Signed)
-----   Message from Gatha Mayer, MD sent at 02/11/2015  9:13 AM EDT ----- You can offer rescheduling but I think he will most likely be ok to do banding today   ----- Message -----    From: Dorethea Strubel E [place], CMA    Sent: 02/11/2015   8:31 AM      To: Gatha Mayer, MD    Mr. Bozzo took his xarelto last night!  According to your last procedure note he was supposed to hold it last night.  Please advise Sir, thank you

## 2015-02-11 NOTE — Progress Notes (Signed)
[facility] [address], [place] [address] Phone: [phone] Subjective:    I'm seeing this patient by the request  of:  [name], MD   CC: Left shoulder pain  [id number] Todd [name] is a 74 y.o. male coming in with complaint of left shoulder pain. Patient was found to have some rotator cuff syndrome as well as subacromial bursitis and mild degenerative changes of the shoulder. Patient like to try a injection, home exercises, icing protocol, and patient was given some topical anti-inflammatory we discussed over-the-counter natural supplementations. Patient states he continues to have some mild discomfort. Patient states though that he is 60-75% better. States that he is able to sleep at night which is significantly better. Denies any radiation down the arm and denies any neck pain that was associated with it earlier. Doing the exercises intermittently and forgets to ice most of the time.     Past Medical History  Diagnosis Date  . HLD (hyperlipidemia)   . HTN (hypertension)   . Allergic rhinitis   . Nephrolithiasis   . PVD (peripheral vascular disease)     Carotid stenosis  . Stroke   . Asthma   . Diabetes mellitus   . CAD (coronary artery disease)     a. 1999; s/p CABG: LIMA to LAD, SVG to OM1, SVG to OM2, SVG to RCA  b. Normal nuc 10/2013 (done because of new onset AF.)  . Atrial fibrillation with rapid ventricular response     a. newly diagnosed 11/03/13, spont conv to NSR, placed on xarelto.  Marland Kitchen Overweight(278.02)    Past Surgical History  Procedure Laterality Date  . Colonoscopy w/ polypectomy  2009    Dr Sharlett Iles  . 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 Carotid endarterectomy  1999  . Nasal sinus surgery    . Colonoscopy  2014    negative;Dr Sharlett Iles  . Hemorrhoid banding     History   Social History  . Marital Status: Married    Spouse Name: [name]  . Number of Children: 2  . Years of Education: N/A    Occupational History  . Sales Manager-retired    Social History Main Topics  . Smoking status: Never Smoker   . Smokeless tobacco: Never Used  . Alcohol Use: No  . Drug Use: No  . Sexual Activity: Not on file   Other Topics Concern  . Not on file   Social History Narrative   Allergies  Allergen Reactions  . Amlodipine Besylate     Rash Because of a history of documented adverse serious drug reaction;Medi Alert bracelet  is recommended  . Ivp Dye [Iodinated Diagnostic Agents]     Rash Because of a history of documented adverse serious drug reaction;Medi Alert bracelet  is recommended   Family History  Problem Relation Age of Onset  . Asthma Sister   . Prostate cancer Brother   . Hemochromatosis Brother   . Heart attack Mother 39  . Colon cancer Brother 44  . Heart attack Brother 60  . Diabetes Father     IDDM  . Stroke Father 16  . Heart attack Brother 63     Past medical history, social, surgical and family history all reviewed in electronic medical record.   Review of Systems: No headache, visual changes, nausea, vomiting, diarrhea, constipation, dizziness, abdominal pain, skin rash, fevers, chills, night sweats, weight loss, swollen lymph nodes, body aches, joint swelling, muscle aches, chest pain,  shortness of breath, mood changes.   Objective Blood pressure 122/80, pulse 75, height 5' 8"$  (1.727 m), weight 208 lb (94.348 kg), SpO2 97 %.  General: No apparent distress alert and oriented x3 mood and affect normal, dressed appropriately.  HEENT: Pupils equal, extraocular movements intact  Respiratory: Patient's speak in full sentences and does not appear short of breath  Cardiovascular: No lower extremity edema, non tender, no erythema  Skin: Warm dry intact with no signs of infection or rash on extremities or on axial skeleton.  Abdomen: Soft nontender  Neuro: Cranial nerves II through XII are intact, neurovascularly intact in all extremities with 2+ DTRs and  2+ pulses.  Lymph: No lymphadenopathy of posterior or anterior cervical chain or axillae bilaterally.  Gait normal with good balance and coordination.  MSK:  Non tender with full range of motion and good stability and symmetric strength and tone of  elbows, wrist, hip, knee and ankles bilaterally.  Shoulder: left Inspection reveals no abnormalities, atrophy or asymmetry. Palpation is normal with no tenderness over AC joint or bicipital groove. ROM is full in all planes passively. Rotator cuff strength normal throughout. Mild signs of impingement left Speeds and Yergason's tests normal. No labral pathology noted with negative Obrien's, negative clunk and good stability. Normal scapular function observed. No painful arc and no drop arm sign. No apprehension sign       Impression and Recommendations:     This case required medical decision making of moderate complexity.

## 2015-02-19 ENCOUNTER — Other Ambulatory Visit: Payer: Self-pay | Admitting: Internal Medicine

## 2015-02-23 ENCOUNTER — Other Ambulatory Visit (INDEPENDENT_AMBULATORY_CARE_PROVIDER_SITE_OTHER): Payer: Commercial Managed Care - HMO

## 2015-02-23 DIAGNOSIS — E1159 Type 2 diabetes mellitus with other circulatory complications: Secondary | ICD-10-CM

## 2015-02-23 DIAGNOSIS — E1151 Type 2 diabetes mellitus with diabetic peripheral angiopathy without gangrene: Secondary | ICD-10-CM

## 2015-02-23 LAB — HEMOGLOBIN A1C: Hgb A1c MFr Bld: 6.8 % — ABNORMAL HIGH (ref 4.6–6.5)

## 2015-03-29 ENCOUNTER — Ambulatory Visit: Payer: Commercial Managed Care - HMO | Admitting: Internal Medicine

## 2015-03-29 DIAGNOSIS — R4689 Other symptoms and signs involving appearance and behavior: Secondary | ICD-10-CM | POA: Insufficient documentation

## 2015-04-23 ENCOUNTER — Telehealth: Payer: Self-pay | Admitting: Cardiology

## 2015-04-23 DIAGNOSIS — E785 Hyperlipidemia, unspecified: Secondary | ICD-10-CM

## 2015-04-23 DIAGNOSIS — E1151 Type 2 diabetes mellitus with diabetic peripheral angiopathy without gangrene: Secondary | ICD-10-CM

## 2015-04-23 DIAGNOSIS — I739 Peripheral vascular disease, unspecified: Principal | ICD-10-CM

## 2015-04-23 DIAGNOSIS — I4891 Unspecified atrial fibrillation: Secondary | ICD-10-CM

## 2015-04-23 DIAGNOSIS — I10 Essential (primary) hypertension: Secondary | ICD-10-CM

## 2015-04-23 DIAGNOSIS — I779 Disorder of arteries and arterioles, unspecified: Secondary | ICD-10-CM

## 2015-04-23 NOTE — Addendum Note (Signed)
Addended by: Golden Hurter D on: 04/23/2015 03:37 PM   Modules accepted: Orders

## 2015-04-23 NOTE — Telephone Encounter (Signed)
Needs a lab order before appt o 07/2015 at 11:45am .Thanks

## 2015-04-23 NOTE — Telephone Encounter (Signed)
Returned call to patient's wife she stated she called to schedule husband's carotid dopplers.Carotid dopplers were scheduled for 08/10/15.Advised 07/28/14 carotid dopplers recommended repeating in 6 months.Schedulers will call back to schedule this month.

## 2015-04-23 NOTE — Telephone Encounter (Signed)
Returned call to patient's wife.Advised to lave fasting lab 1 week before appointment with Dr.Hochrein 08/10/15.Stated husband wanted to have lab done at [facility] office.Lab appointment scheduled at [facility] 08/03/15.

## 2015-04-27 ENCOUNTER — Other Ambulatory Visit: Payer: Self-pay | Admitting: Cardiology

## 2015-04-27 DIAGNOSIS — I6523 Occlusion and stenosis of bilateral carotid arteries: Secondary | ICD-10-CM

## 2015-05-01 ENCOUNTER — Other Ambulatory Visit: Payer: Self-pay | Admitting: Internal Medicine

## 2015-05-03 ENCOUNTER — Ambulatory Visit (HOSPITAL_COMMUNITY)
Admission: RE | Admit: 2015-05-03 | Discharge: 2015-05-03 | Disposition: A | Discharge status: Home / Self Care | Payer: Commercial Managed Care - HMO | Source: Ambulatory Visit | Attending: Cardiology | Admitting: Cardiology

## 2015-05-03 ENCOUNTER — Other Ambulatory Visit: Payer: Self-pay | Admitting: *Deleted

## 2015-05-03 ENCOUNTER — Other Ambulatory Visit: Payer: Self-pay | Admitting: Emergency Medicine

## 2015-05-03 DIAGNOSIS — E119 Type 2 diabetes mellitus without complications: Secondary | ICD-10-CM | POA: Diagnosis not present

## 2015-05-03 DIAGNOSIS — E785 Hyperlipidemia, unspecified: Secondary | ICD-10-CM | POA: Diagnosis not present

## 2015-05-03 DIAGNOSIS — E1151 Type 2 diabetes mellitus with diabetic peripheral angiopathy without gangrene: Secondary | ICD-10-CM

## 2015-05-03 DIAGNOSIS — I2510 Atherosclerotic heart disease of native coronary artery without angina pectoris: Secondary | ICD-10-CM | POA: Diagnosis not present

## 2015-05-03 DIAGNOSIS — I6523 Occlusion and stenosis of bilateral carotid arteries: Secondary | ICD-10-CM

## 2015-05-03 DIAGNOSIS — I10 Essential (primary) hypertension: Secondary | ICD-10-CM | POA: Insufficient documentation

## 2015-05-03 MED ORDER — GLIMEPIRIDE 2 MG PO TABS: 2.0000 mg | ORAL_TABLET | Freq: Every day | ORAL | Status: DC

## 2015-05-03 MED ORDER — XARELTO 20 MG PO TABS: 20.0000 mg | ORAL_TABLET | Freq: Every day | ORAL | Status: DC

## 2015-05-03 NOTE — Telephone Encounter (Signed)
Xarelto samples provided for patient at request.

## 2015-07-21 ENCOUNTER — Telehealth: Payer: Self-pay | Admitting: Gastroenterology

## 2015-07-21 NOTE — Telephone Encounter (Signed)
Patient with a several month history of rectal bleeding.  He has had banding x 2 this summer.  He will come in to discuss possible banding on Friday 07/23/15.  He is on xeralto that has been held after the banding.  Does he need to hold it prior?

## 2015-07-21 NOTE — Telephone Encounter (Signed)
Left message for patient to call back  

## 2015-07-21 NOTE — Telephone Encounter (Signed)
I prefer he not take it the day before. I reviewed chart and we had ok earlier this year and see no reason we cannot hold it again (after the banding and the day before).

## 2015-07-22 NOTE — Telephone Encounter (Signed)
Patient notified to hold xeralto today and tomorrow.

## 2015-07-23 ENCOUNTER — Encounter: Payer: Self-pay | Admitting: Internal Medicine

## 2015-07-23 ENCOUNTER — Ambulatory Visit (INDEPENDENT_AMBULATORY_CARE_PROVIDER_SITE_OTHER): Payer: Commercial Managed Care - HMO | Admitting: Internal Medicine

## 2015-07-23 VITALS — BP 142/86 | HR 68 | Ht 68.0 in | Wt 218.2 lb

## 2015-07-23 DIAGNOSIS — K601 Chronic anal fissure: Secondary | ICD-10-CM

## 2015-07-23 MED ORDER — AMBULATORY NON FORMULARY MEDICATION: Status: DC

## 2015-07-23 NOTE — Progress Notes (Signed)
   Subjective:    Patient ID: Todd [name], male    DOB: Sep 16, 1940, 74 y.o.   MRN: [id number] Chief complaint: Rectal pain HPI  This is a patient that had banding of hemorrhoids earlier this year and says the bleeding from the hemorrhoids is significantly improved almost nonexistent. However, he is complaining of rectal discomfort with sitting in passing a stool. It was there prior to the hemorrhoid banding. He occasionally has some spotting of blood. He takes Xarelto also.  Medications, allergies, past medical history, past surgical history, family history and social history are reviewed and updated in the EMR.   Review of Systems As above    Objective:   Physical Exam BP 142/86 mmHg  Pulse 68  Ht 5\' 8"  (1.727 m)  Wt 218 lb 3.2 oz (98.975 kg)  BMI 33.18 kg/m2  Rectal exam reveals a normal anoderm. There is some induration and tenderness in the anterior aspect of the anal canal.  An anoscopy is performed demonstrating grade 1 internal hemorrhoids in all positions, and there appears to be an anterior fissure.      Assessment & Plan:   1. Chronic anal fissure    I will treat this with diltiazem 2% cream twice a day and after defecation when able. He will return in 3 months and is instructed to continue the diltiazem for at least one month after feeling well. He moves his bowels without difficulty using MiraLAX and he will continue that.

## 2015-07-23 NOTE — Patient Instructions (Signed)
   We have sent the following medications to your pharmacy for you to pick up at your convenience: Diltiazem cream  Use the rectal cream for a month after you feel better.   Follow up with [place] in 3 months.    Go back on your xarelto today per Dr Carlean Purl.   I appreciate the opportunity to care for you. Silvano Rusk, MD, [facility]

## 2015-07-24 ENCOUNTER — Other Ambulatory Visit: Payer: Self-pay | Admitting: Internal Medicine

## 2015-08-03 ENCOUNTER — Other Ambulatory Visit (INDEPENDENT_AMBULATORY_CARE_PROVIDER_SITE_OTHER): Payer: Commercial Managed Care - HMO | Admitting: *Deleted

## 2015-08-03 DIAGNOSIS — I739 Peripheral vascular disease, unspecified: Principal | ICD-10-CM

## 2015-08-03 DIAGNOSIS — I10 Essential (primary) hypertension: Secondary | ICD-10-CM

## 2015-08-03 DIAGNOSIS — E785 Hyperlipidemia, unspecified: Secondary | ICD-10-CM

## 2015-08-03 DIAGNOSIS — I779 Disorder of arteries and arterioles, unspecified: Secondary | ICD-10-CM

## 2015-08-03 DIAGNOSIS — E1151 Type 2 diabetes mellitus with diabetic peripheral angiopathy without gangrene: Secondary | ICD-10-CM

## 2015-08-03 DIAGNOSIS — I4891 Unspecified atrial fibrillation: Secondary | ICD-10-CM

## 2015-08-03 LAB — BASIC METABOLIC PANEL
BUN: 18 mg/dL (ref 7–25)
CO2: 22 mmol/L (ref 20–31)
Calcium: 9.3 mg/dL (ref 8.6–10.3)
Chloride: 106 mmol/L (ref 98–110)
Creat: 1.23 mg/dL — ABNORMAL HIGH (ref 0.70–1.18)
Glucose, Bld: 113 mg/dL — ABNORMAL HIGH (ref 65–99)
Potassium: 4.2 mmol/L (ref 3.5–5.3)
Sodium: 139 mmol/L (ref 135–146)

## 2015-08-03 LAB — CBC WITH DIFFERENTIAL/PLATELET
Basophils Absolute: 0 10*3/uL (ref 0.0–0.1)
Basophils Relative: 0 % (ref 0–1)
Eosinophils Absolute: 0.1 10*3/uL (ref 0.0–0.7)
Eosinophils Relative: 2 % (ref 0–5)
HCT: 40.3 % (ref 39.0–52.0)
Hemoglobin: 14.5 g/dL (ref 13.0–17.0)
Lymphocytes Relative: 43 % (ref 12–46)
Lymphs Abs: 2.1 10*3/uL (ref 0.7–4.0)
MCH: 33.6 pg (ref 26.0–34.0)
MCHC: 36 g/dL (ref 30.0–36.0)
MCV: 93.5 fL (ref 78.0–100.0)
MPV: 10.2 fL (ref 8.6–12.4)
Monocytes Absolute: 0.4 10*3/uL (ref 0.1–1.0)
Monocytes Relative: 9 % (ref 3–12)
Neutro Abs: 2.2 10*3/uL (ref 1.7–7.7)
Neutrophils Relative %: 46 % (ref 43–77)
Platelets: 171 10*3/uL (ref 150–400)
RBC: 4.31 MIL/uL (ref 4.22–5.81)
RDW: 14.2 % (ref 11.5–15.5)
WBC: 4.8 10*3/uL (ref 4.0–10.5)

## 2015-08-03 LAB — HEPATIC FUNCTION PANEL
ALT: 36 U/L (ref 9–46)
AST: 45 U/L — ABNORMAL HIGH (ref 10–35)
Albumin: 3.7 g/dL (ref 3.6–5.1)
Alkaline Phosphatase: 58 U/L (ref 40–115)
Bilirubin, Direct: 0.1 mg/dL (ref <=0.2)
Indirect Bilirubin: 0.6 mg/dL (ref 0.2–1.2)
Total Bilirubin: 0.7 mg/dL (ref 0.2–1.2)
Total Protein: 7.1 g/dL (ref 6.1–8.1)

## 2015-08-03 LAB — LIPID PANEL
Cholesterol: 129 mg/dL (ref 125–200)
HDL: 31 mg/dL — ABNORMAL LOW (ref >=40)
LDL Cholesterol: 75 mg/dL (ref <130)
Total CHOL/HDL Ratio: 4.2 Ratio (ref <=5.0)
Triglycerides: 117 mg/dL (ref <150)
VLDL: 23 mg/dL (ref <30)

## 2015-08-03 NOTE — Addendum Note (Signed)
Addended by: Domenica Reamer R on: 08/03/2015 09:03 AM   Modules accepted: Orders

## 2015-08-04 LAB — HEMOGLOBIN A1C
Hgb A1c MFr Bld: 7.2 % — ABNORMAL HIGH (ref <5.7)
Mean Plasma Glucose: 160 mg/dL — ABNORMAL HIGH (ref <117)

## 2015-08-10 ENCOUNTER — Ambulatory Visit (INDEPENDENT_AMBULATORY_CARE_PROVIDER_SITE_OTHER): Payer: Commercial Managed Care - HMO | Admitting: Cardiology

## 2015-08-10 ENCOUNTER — Encounter (HOSPITAL_COMMUNITY): Payer: Commercial Managed Care - HMO

## 2015-08-10 ENCOUNTER — Encounter: Payer: Self-pay | Admitting: Cardiology

## 2015-08-10 VITALS — BP 140/78 | HR 71 | Ht 68.0 in | Wt 218.4 lb

## 2015-08-10 DIAGNOSIS — I739 Peripheral vascular disease, unspecified: Secondary | ICD-10-CM | POA: Diagnosis not present

## 2015-08-10 DIAGNOSIS — I2510 Atherosclerotic heart disease of native coronary artery without angina pectoris: Secondary | ICD-10-CM

## 2015-08-10 DIAGNOSIS — I10 Essential (primary) hypertension: Secondary | ICD-10-CM | POA: Diagnosis not present

## 2015-08-10 NOTE — Patient Instructions (Signed)
Your physician wants you to follow-up in: 1 Year. You will receive a reminder letter in the mail two months in advance. If you don't receive a letter, please call our office to schedule the follow-up appointment.  Mikki Santee and Happy New Year!!!

## 2015-08-10 NOTE — Progress Notes (Signed)
HPI The patient presents for follow up of CAD and atrial fibrillation.  In March of last year he had a stress perfusion study demonstrating no evidence of ischemia or infarct with preserved ejection fraction.  Since we last saw him he has done well. The patient denies any new symptoms such as chest discomfort, neck or arm discomfort. There has been no new shortness of breath, PND or orthopnea. There have been no reported palpitations, presyncope or syncope.  He is not exercising as much as I would like.      Allergies  Allergen Reactions  . Amlodipine Besylate     Rash Because of a history of documented adverse serious drug reaction;Medi Alert bracelet  is recommended  . Ivp Dye [Iodinated Diagnostic Agents]     Rash Because of a history of documented adverse serious drug reaction;Medi Alert bracelet  is recommended    Current Outpatient Prescriptions  Medication Sig Dispense Refill  . ACCU-CHEK FASTCLIX LANCETS MISC Use to check blood sugars twice a day Dx E11.59 180 each 3  . ACCU-CHEK SMARTVIEW test strip 1 each by Other route daily.     . AMBULATORY NON FORMULARY MEDICATION Medication Name: Diltiazem cream ,apply to rectum Twice a day and after bowel movements 30 g 2  . Blood Glucose Monitoring Suppl (ACCU-CHEK NANO SMARTVIEW) W/DEVICE KIT 1 Device by Does not apply route as needed. 1 kit 0  . cetirizine (ZYRTEC) 10 MG tablet Take 10 mg by mouth daily as needed for allergies.     . Cholecalciferol (VITAMIN D-3) 5000 UNITS TABS Take 5,000 Units by mouth daily.     . fish oil-omega-3 fatty acids 1000 MG capsule Take 1 g by mouth 2 (two) times daily.     Marland Kitchen glimepiride (AMARYL) 2 MG tablet TAKE 1 TABLET (2 MG TOTAL) BY MOUTH DAILY BEFORE BREAKFAST. 90 tablet 0  . GLUCOSAMINE CHONDROITIN COMPLX PO Take 1 tablet by mouth 2 (two) times daily. Pt takes at night    . glucose 4 GM chewable tablet Chew 1 tablet by mouth as needed for low blood sugar.    . metFORMIN (GLUCOPHAGE-XR) 500 MG 24  hr tablet Take 1 tablet (500 mg total) by mouth 2 (two) times daily. 180 tablet 3  . metoprolol (LOPRESSOR) 50 MG tablet Take 1 tablet (50 mg total) by mouth 2 (two) times daily. 180 tablet 3  . nitroGLYCERIN (NITROSTAT) 0.4 MG SL tablet Place 1 tablet (0.4 mg total) under the tongue every 5 (five) minutes as needed for chest pain. 25 tablet 6  . omeprazole (PRILOSEC) 20 MG capsule Take 1 capsule (20 mg total) by mouth 2 (two) times daily. (Patient taking differently: Take 20 mg by mouth daily. ) 180 capsule 3  . polycarbophil (FIBERCON) 625 MG tablet Take 625 mg by mouth 2 (two) times daily.     . Probiotic Product (PROBIOTIC DAILY PO) Take 1 tablet by mouth daily.    . simvastatin (ZOCOR) 40 MG tablet Take 1 tablet (40 mg total) by mouth at bedtime. (Patient taking differently: Take 40 mg by mouth every evening. ) 90 tablet 3  . XARELTO 20 MG TABS tablet Take 1 tablet (20 mg total) by mouth daily with supper. 25 tablet 0   No current facility-administered medications for this visit.    Past Medical History  Diagnosis Date  . HLD (hyperlipidemia)   . HTN (hypertension)   . Allergic rhinitis   . Nephrolithiasis   . PVD (peripheral vascular disease) ([place])  Carotid stenosis  . Stroke ([place])   . Asthma   . Diabetes mellitus ([place])   . CAD (coronary artery disease)     a. 1999; s/p CABG: LIMA to LAD, SVG to OM1, SVG to OM2, SVG to RCA  b. Normal nuc 10/2013 (done because of new onset AF.)  . Atrial fibrillation with rapid ventricular response ([place])     a. newly diagnosed 11/03/13, spont conv to NSR, placed on xarelto.  Marland Kitchen Overweight(278.02)     Past Surgical History  Procedure Laterality Date  . Colonoscopy w/ polypectomy  2009    Dr Sharlett Iles  . 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 Carotid endarterectomy  1999  . Nasal sinus surgery    . Colonoscopy  2014    negative;Dr Sharlett Iles  . Hemorrhoid banding      ROS:  As stated in the HPI and negative for all other  systems.  PHYSICAL EXAM BP 140/78 mmHg  Pulse 71  Ht _0  (1.727 m)  Wt 218 lb 6.4 oz (99.066 kg)  BMI 33.22 kg/m2 GENERAL:  Well appearing NECK:  No jugular venous distention, waveform within normal limits, carotid upstroke brisk and symmetric, no bruits, no thyromegaly LUNGS:  Clear to auscultation bilaterally CHEST:  Well healed sternotomy scar. HEART:  PMI not displaced or sustained,S1 and S2 within normal limits, no S3, no S4, no clicks, no rubs, no murmurs ABD:  Flat, positive bowel sounds normal in frequency in pitch, no bruits, no rebound, no guarding, no midline pulsatile mass, no hepatomegaly, no splenomegaly EXT:  2 plus pulses throughout, no edema, no cyanosis no clubbing  EKG:  Sinus rhythm, rate 71, axis within normal limits, intervals within normal limits, no acute ST-T wave changes.   08/10/2015  ASSESSMENT AND PLAN  ATRIAL FIB - The patient had had no symptomatic recurrence of this. Todd [name] has a CHA2DS2 - VASc score of 6 he will remain on anticoagulatoin.   C A D -  The patient has no new sypmtoms since stress testing last year.  No further cardiovascular testing is indicated. We will continue with aggressive risk reduction and meds as listed.   CAROTID ARTERY DISEASE -  He had follow up this month and has 40 - 59 % right stenosis and will get follow up in six months.  HYPERLIPIDEMIA -  Labs as below.  He will continue the meds as listed.  Lab Results  Component Value Date   CHOL 129 08/03/2015   TRIG 117 08/03/2015   HDL 31* 08/03/2015   LDLCALC 75 08/03/2015    OVERWEIGHT/OBESITY -   He understands the need to lose weight.  I again gave him a prescription for exercise.   HYPERTENSION - The blood pressure is at target. No change in medications is indicated. We will continue with therapeutic lifestyle changes (TLC).  DM A1C was 7.2 7 days ago.  We talked about increasing the exercise to further manage this.  This is creeping up.

## 2015-08-25 ENCOUNTER — Encounter: Payer: Self-pay | Admitting: Family Medicine

## 2015-08-25 ENCOUNTER — Ambulatory Visit (INDEPENDENT_AMBULATORY_CARE_PROVIDER_SITE_OTHER): Payer: PPO | Admitting: Family Medicine

## 2015-08-25 VITALS — BP 138/92 | HR 79 | Temp 98.3°F | Ht 68.0 in | Wt 217.5 lb

## 2015-08-25 DIAGNOSIS — J988 Other specified respiratory disorders: Secondary | ICD-10-CM

## 2015-08-25 MED ORDER — DOXYCYCLINE HYCLATE 100 MG PO TABS: 100.0000 mg | ORAL_TABLET | Freq: Two times a day (BID) | ORAL | Status: DC

## 2015-08-25 MED ORDER — HYDROCOD POLST-CPM POLST ER 10-8 MG/5ML PO SUER: 5.0000 mL | Freq: Two times a day (BID) | ORAL | Status: DC | PRN

## 2015-08-25 NOTE — Patient Instructions (Signed)
Take the antibiotic as prescribed.  Use the cough medication at night.  Follow up closely with your PCP.  Take care  Dr. Lacinda Axon

## 2015-08-25 NOTE — Progress Notes (Signed)
Subjective:  Patient ID: Todd [name], male    DOB: 27-May-1941  Age: 75 y.o. MRN: [id number]  CC: Nasal congestion, Cough  HPI:   75 year old male presents for an acute visit with the above complaints.  Patient states that he's been sick since Christmas. He's been experiencing significant chest and head congestion. He states that his and blowing discolored mucus out of his nose. He's also had a productive cough. No associated fever/chills.  No exacerbating or relieving factors. He's had no improvement over the course of his illness.  Social Hx   Social History   Social History  . Marital Status: Married    Spouse Name: [name]  . Number of Children: 2  . Years of Education: N/A   Occupational History  . Sales Manager-retired    Social History Main Topics  . Smoking status: Never Smoker   . Smokeless tobacco: Never Used  . Alcohol Use: No  . Drug Use: No  . Sexual Activity: Not Asked   Other Topics Concern  . None   Social History Narrative   Review of Systems  HENT: Positive for congestion.   Respiratory: Positive for cough.     Objective:  BP 138/92 mmHg  Pulse 79  Temp(Src) 98.3 F (36.8 C) (Oral)  Ht 5\' 8"  (1.727 m)  Wt 217 lb 8 oz (98.657 kg)  BMI 33.08 kg/m2  SpO2 96%  BP/Weight 08/25/2015 08/10/2015 [id number]  Systolic BP 0000000 [id number]  Diastolic BP 92 78 86  Wt. (Lbs) 217.5 218.4 218.2  BMI 33.08 33.22 33.18   Physical Exam  Constitutional: He appears well-developed. No distress.  HENT:  Head: Normocephalic and atraumatic.  Mouth/Throat: Oropharynx is clear and moist.  Normal TMs bilaterally.  Neck: Neck supple.  Cardiovascular: Normal rate and regular rhythm.   Pulmonary/Chest: Effort normal and breath sounds normal.  Lymphadenopathy:    He has no cervical adenopathy.  Neurological: He is alert.  Psychiatric: He has a normal mood and affect.  Vitals reviewed.   Lab Results  Component Value Date   WBC 4.8 08/03/2015   HGB 14.5 08/03/2015   HCT 40.3 08/03/2015   PLT 171 08/03/2015   GLUCOSE 113* 08/03/2015   CHOL 129 08/03/2015   TRIG 117 08/03/2015   HDL 31* 08/03/2015   LDLCALC 75 08/03/2015   ALT 36 08/03/2015   AST 45* 08/03/2015   NA 139 08/03/2015   K 4.2 08/03/2015   CL 106 08/03/2015   CREATININE 1.23* 08/03/2015   BUN 18 08/03/2015   CO2 22 08/03/2015   TSH 4.12 12/07/2014   PSA 0.24 07/20/2008   INR 1.55* 11/11/2014   HGBA1C 7.2* 08/03/2015   MICROALBUR <0.7 10/22/2014    Assessment & Plan:   Problem List Items Addressed This Visit    Respiratory infection - Primary    New problem. Given duration of illness and lack of improvement, and treating empirically with doxycycline. Tussionex for cough.         Meds ordered this encounter  Medications  . DISCONTD: doxycycline (VIBRA-TABS) 100 MG tablet    Sig: Take 1 tablet (100 mg total) by mouth 2 (two) times daily.    Dispense:  14 tablet    Refill:  0  . chlorpheniramine-HYDROcodone (TUSSIONEX PENNKINETIC ER) 10-8 MG/5ML SUER    Sig: Take 5 mLs by mouth every 12 (twelve) hours as needed.    Dispense:  115 mL    Refill:  0  . doxycycline (VIBRA-TABS) 100 MG tablet  Sig: Take 1 tablet (100 mg total) by mouth 2 (two) times daily.    Dispense:  14 tablet    Refill:  0    Follow-up: PRN  [place]

## 2015-08-25 NOTE — Progress Notes (Signed)
Pre visit review using our clinic review tool, if applicable. No additional management support is needed unless otherwise documented below in the visit note. 

## 2015-08-26 ENCOUNTER — Ambulatory Visit: Payer: Commercial Managed Care - HMO | Admitting: Internal Medicine

## 2015-08-26 DIAGNOSIS — J988 Other specified respiratory disorders: Secondary | ICD-10-CM | POA: Insufficient documentation

## 2015-08-26 NOTE — Assessment & Plan Note (Signed)
New problem. Given duration of illness and lack of improvement, and treating empirically with doxycycline. Tussionex for cough.

## 2015-09-02 ENCOUNTER — Telehealth: Payer: Self-pay | Admitting: Internal Medicine

## 2015-09-02 ENCOUNTER — Other Ambulatory Visit: Payer: Self-pay | Admitting: Cardiology

## 2015-09-02 DIAGNOSIS — E1151 Type 2 diabetes mellitus with diabetic peripheral angiopathy without gangrene: Secondary | ICD-10-CM

## 2015-09-02 MED ORDER — ACCU-CHEK NANO SMARTVIEW W/DEVICE KIT: 1.0000 | PACK | Status: DC | PRN

## 2015-09-02 MED ORDER — ACCU-CHEK SMARTVIEW VI STRP: 1.0000 | ORAL_STRIP | Freq: Every day | Status: DC

## 2015-09-02 MED ORDER — ACCU-CHEK FASTCLIX LANCETS MISC: Status: DC

## 2015-09-02 NOTE — Telephone Encounter (Signed)
I have scheduled a transfer appt to [facility] in March 3rd.  Patient's meter is also broke and needs a new meter.  Patient currently uses accucheck smartview.  Is requesting to see of our office has those to give to him.  If not a script can [facility].  Fax number [phone].

## 2015-09-02 NOTE — Telephone Encounter (Signed)
Spoke with wife to inform. Rx has been sent to [facility]

## 2015-09-02 NOTE — Telephone Encounter (Signed)
°*  STAT* If patient is at the pharmacy, call can be transferred to refill team.   1. Which medications need to be refilled? (please list name of each medication and dose if known)Simvastatin,Xarelto,and Metoprolol  2. Which pharmacy/location (including street and city if local pharmacy) is medication to be sent to?[facility]Fax#[phone]  3. Do they need a 30 day or 90 day supply? Metoprolol-180 and refills,the others 90 and refill

## 2015-09-03 MED ORDER — SIMVASTATIN 40 MG PO TABS: 40.0000 mg | ORAL_TABLET | Freq: Every evening | ORAL | Status: DC

## 2015-09-03 MED ORDER — XARELTO 20 MG PO TABS: 20.0000 mg | ORAL_TABLET | Freq: Every day | ORAL | Status: DC

## 2015-09-03 MED ORDER — METOPROLOL TARTRATE 50 MG PO TABS: 50.0000 mg | ORAL_TABLET | Freq: Two times a day (BID) | ORAL | Status: DC

## 2015-09-03 NOTE — Telephone Encounter (Signed)
Rx(s) sent to pharmacy electronically.  

## 2015-10-04 ENCOUNTER — Telehealth: Payer: Self-pay | Admitting: Internal Medicine

## 2015-10-04 DIAGNOSIS — K625 Hemorrhage of anus and rectum: Secondary | ICD-10-CM

## 2015-10-04 NOTE — Telephone Encounter (Signed)
Spotting with BM.   He will come for CBC one day this week.  Notified of recommendations.

## 2015-10-04 NOTE — Telephone Encounter (Signed)
I am not surprised that he still needs the diltiazem and that he is seeing some bleeding (he is on Xarelto)  1) Make sure not straining to stool - take Benefiber, etc if he is 2) Stay on the Diltiazem bid at least and if he can take it after defecation that would be good 3) Ask him to get a CBC re: rectal bleeding 4) When last here he was c/o spotting of blood - is it still like that or worse?

## 2015-10-04 NOTE — Telephone Encounter (Signed)
Patient with daily rectal bleeding.  His wife reports he is still needing to use the diltiazem.  He is scheduled for follow up Mid March.  Does he need any additional treatment until office visit?

## 2015-10-14 ENCOUNTER — Telehealth: Payer: Self-pay | Admitting: *Deleted

## 2015-10-14 DIAGNOSIS — E1151 Type 2 diabetes mellitus with diabetic peripheral angiopathy without gangrene: Secondary | ICD-10-CM

## 2015-10-14 MED ORDER — ACCU-CHEK FASTCLIX LANCETS MISC: Status: DC

## 2015-10-14 MED ORDER — ACCU-CHEK SMARTVIEW VI STRP: ORAL_STRIP | Status: DC

## 2015-10-14 MED ORDER — ACCU-CHEK NANO SMARTVIEW W/DEVICE KIT
1.0000 | PACK | Status: DC | PRN
Start: 2015-10-14 — End: 2015-11-04

## 2015-10-14 NOTE — Telephone Encounter (Signed)
Receive call pt wife states he is needing refills on his BS supplies. Verified meter pt use she states he has a Accu-chek nano but need a new one too. Inform pt wife will send [facility] a new rx on monitor & supplies...Johny Chess

## 2015-10-22 ENCOUNTER — Other Ambulatory Visit (INDEPENDENT_AMBULATORY_CARE_PROVIDER_SITE_OTHER): Payer: PPO

## 2015-10-22 ENCOUNTER — Encounter: Payer: Self-pay | Admitting: Internal Medicine

## 2015-10-22 ENCOUNTER — Ambulatory Visit (INDEPENDENT_AMBULATORY_CARE_PROVIDER_SITE_OTHER): Payer: PPO | Admitting: Internal Medicine

## 2015-10-22 ENCOUNTER — Ambulatory Visit: Payer: PPO | Admitting: Internal Medicine

## 2015-10-22 VITALS — BP 130/80 | HR 71 | Temp 98.7°F | Resp 16 | Wt 215.0 lb

## 2015-10-22 DIAGNOSIS — I10 Essential (primary) hypertension: Secondary | ICD-10-CM

## 2015-10-22 DIAGNOSIS — E1151 Type 2 diabetes mellitus with diabetic peripheral angiopathy without gangrene: Secondary | ICD-10-CM

## 2015-10-22 DIAGNOSIS — E785 Hyperlipidemia, unspecified: Secondary | ICD-10-CM | POA: Diagnosis not present

## 2015-10-22 DIAGNOSIS — I2510 Atherosclerotic heart disease of native coronary artery without angina pectoris: Secondary | ICD-10-CM

## 2015-10-22 LAB — COMPREHENSIVE METABOLIC PANEL
ALT: 30 U/L (ref 0–53)
AST: 32 U/L (ref 0–37)
Albumin: 4.1 g/dL (ref 3.5–5.2)
Alkaline Phosphatase: 63 U/L (ref 39–117)
BUN: 21 mg/dL (ref 6–23)
CO2: 26 mEq/L (ref 19–32)
Calcium: 9.6 mg/dL (ref 8.4–10.5)
Chloride: 105 mEq/L (ref 96–112)
Creatinine, Ser: 0.92 mg/dL (ref 0.40–1.50)
GFR: 85.78 mL/min (ref >60.00)
Glucose, Bld: 147 mg/dL — ABNORMAL HIGH (ref 70–99)
Potassium: 4.2 mEq/L (ref 3.5–5.1)
Sodium: 140 mEq/L (ref 135–145)
Total Bilirubin: 0.7 mg/dL (ref 0.2–1.2)
Total Protein: 7.4 g/dL (ref 6.0–8.3)

## 2015-10-22 LAB — CBC WITH DIFFERENTIAL/PLATELET
Basophils Absolute: 0 10*3/uL (ref 0.0–0.1)
Basophils Relative: 0.3 % (ref 0.0–3.0)
Eosinophils Absolute: 0.1 10*3/uL (ref 0.0–0.7)
Eosinophils Relative: 1.5 % (ref 0.0–5.0)
HCT: 41.3 % (ref 39.0–52.0)
Hemoglobin: 14.1 g/dL (ref 13.0–17.0)
Lymphocytes Relative: 40.6 % (ref 12.0–46.0)
Lymphs Abs: 2.4 10*3/uL (ref 0.7–4.0)
MCHC: 34.3 g/dL (ref 30.0–36.0)
MCV: 96.6 fl (ref 78.0–100.0)
Monocytes Absolute: 0.5 10*3/uL (ref 0.1–1.0)
Monocytes Relative: 7.9 % (ref 3.0–12.0)
Neutro Abs: 2.9 10*3/uL (ref 1.4–7.7)
Neutrophils Relative %: 49.7 % (ref 43.0–77.0)
Platelets: 179 10*3/uL (ref 150.0–400.0)
RBC: 4.27 Mil/uL (ref 4.22–5.81)
RDW: 14.9 % (ref 11.5–15.5)
WBC: 5.9 10*3/uL (ref 4.0–10.5)

## 2015-10-22 LAB — MICROALBUMIN / CREATININE URINE RATIO
Creatinine,U: 172.8 mg/dL
Microalb Creat Ratio: 0.6 mg/g (ref 0.0–30.0)
Microalb, Ur: 1 mg/dL (ref 0.0–1.9)

## 2015-10-22 LAB — HEMOGLOBIN A1C: Hgb A1c MFr Bld: 7.6 % — ABNORMAL HIGH (ref 4.6–6.5)

## 2015-10-22 MED ORDER — METFORMIN HCL ER 500 MG PO TB24: 500.0000 mg | ORAL_TABLET | Freq: Two times a day (BID) | ORAL | Status: DC

## 2015-10-22 MED ORDER — GLIMEPIRIDE 2 MG PO TABS: ORAL_TABLET | ORAL | Status: DC

## 2015-10-22 NOTE — Progress Notes (Signed)
Pre visit review using our clinic review tool, if applicable. No additional management support is needed unless otherwise documented below in the visit note. 

## 2015-10-22 NOTE — Progress Notes (Signed)
Subjective:    Patient ID: Todd [name], male    DOB: 1940/12/24, 75 y.o.   MRN: [id number]  HPI He is here for follow up.   Diabetes: He is taking his medication daily as prescribed. He is not compliant with a diabetic diet - likes his sweets and carbs. He is not exercising regularly. He monitors his sugars and they have been running 110-150 - he has two machines and they are about 30 points different. He checks his feet daily and denies foot lesions. He denies numbness/tingling in his feet.  He is up-to-date with an ophthalmology examination. His a1c in December was 7.2.    CAD, Hypertension: He is taking his medication daily. He is compliant with a low sodium diet.  He denies chest pain, palpitations, shortness of breath and regular headaches. He is not exercising regularly.  He does not monitor his blood pressure at home.    Hyperlipidemia: He is taking his medication daily. He is compliant with a low fat/cholesterol diet. He is not exercising regularly.    Medications and allergies reviewed with patient and updated if appropriate.  Patient Active Problem List   Diagnosis Date Noted  . Respiratory infection 08/26/2015  . 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 Non-compliant behavior 03/29/2015  . Hemorrhoids, internal, with bleeding 01/15/2015  . Bursitis of left shoulder 01/14/2015  . Hemorrhoid 07/06/2014  . Type 2 diabetes, controlled, with peripheral circulatory disorder ([place]) 12/05/2013  . PVD (peripheral vascular disease) ([place]) 11/12/2013  . Atrial fibrillation with RVR ([place]) 11/03/2013  . CAROTID ARTERY DISEASE 12/20/2009  . OVERWEIGHT/OBESITY 11/17/2008  . Hyperlipidemia 07/20/2008  . Essential hypertension 07/20/2008  . Coronary atherosclerosis 07/20/2008  . ALLERGIC RHINITIS 07/20/2008  . NEPHROLITHIASIS, HX OF 07/20/2008    Current Outpatient Prescriptions on File Prior to Visit  Medication Sig Dispense Refill  . ACCU-CHEK SMARTVIEW test strip Use to check blood  sugars twice a day Dx E11.9 100 each 3  . AMBULATORY NON FORMULARY MEDICATION Medication Name: Diltiazem cream ,apply to rectum Twice a day and after bowel movements 30 g 2  . Blood Glucose Monitoring Suppl (ACCU-CHEK NANO SMARTVIEW) w/Device KIT 1 Device by Does not apply route as needed. Use as directed to check blood sugars twice a day Dx e11.9 1 kit 0  . cetirizine (ZYRTEC) 10 MG tablet Take 10 mg by mouth daily as needed for allergies.     . Cholecalciferol (VITAMIN D-3) 5000 UNITS TABS Take 5,000 Units by mouth daily.     . fish oil-omega-3 fatty acids 1000 MG capsule Take 1 g by mouth 2 (two) times daily.     Marland Kitchen glimepiride (AMARYL) 2 MG tablet TAKE 1 TABLET (2 MG TOTAL) BY MOUTH DAILY BEFORE BREAKFAST. 90 tablet 0  . GLUCOSAMINE CHONDROITIN COMPLX PO Take 1 tablet by mouth 2 (two) times daily. Pt takes at night    . glucose 4 GM chewable tablet Chew 1 tablet by mouth as needed for low blood sugar.    . metFORMIN (GLUCOPHAGE-XR) 500 MG 24 hr tablet Take 1 tablet (500 mg total) by mouth 2 (two) times daily. 180 tablet 3  . metoprolol (LOPRESSOR) 50 MG tablet Take 1 tablet (50 mg total) by mouth 2 (two) times daily. 180 tablet 3  . nitroGLYCERIN (NITROSTAT) 0.4 MG SL tablet Place 1 tablet (0.4 mg total) under the tongue every 5 (five) minutes as needed for chest pain. 25 tablet 6  . omeprazole (PRILOSEC) 20 MG capsule Take 1 capsule (  20 mg total) by mouth 2 (two) times daily. (Patient taking differently: Take 20 mg by mouth daily. ) 180 capsule 3  . polycarbophil (FIBERCON) 625 MG tablet Take 625 mg by mouth 2 (two) times daily.     . Probiotic Product (PROBIOTIC DAILY PO) Take 1 tablet by mouth daily.    . simvastatin (ZOCOR) 40 MG tablet Take 1 tablet (40 mg total) by mouth every evening. 90 tablet 3  . XARELTO 20 MG TABS tablet Take 1 tablet (20 mg total) by mouth daily with supper. 90 tablet 3   No current facility-administered medications on file prior to visit.    Past Medical  History  Diagnosis Date  . HLD (hyperlipidemia)   . HTN (hypertension)   . Allergic rhinitis   . Nephrolithiasis   . PVD (peripheral vascular disease) ([facility])     Carotid stenosis  . Stroke ([place])   . Asthma   . Diabetes mellitus ([place])   . CAD (coronary artery disease)     a. 1999; s/p CABG: LIMA to LAD, SVG to OM1, SVG to OM2, SVG to RCA  b. Normal nuc 10/2013 (done because of new onset AF.)  . Atrial fibrillation with rapid ventricular response ([place])     a. newly diagnosed 11/03/13, spont conv to NSR, placed on xarelto.  Marland Kitchen Overweight(278.02)     Past Surgical History  Procedure Laterality Date  . Colonoscopy w/ polypectomy  2009    Dr Sharlett Iles  . 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 Carotid endarterectomy  1999  . Nasal sinus surgery    . Colonoscopy  2014    negative;Dr Sharlett Iles  . Hemorrhoid banding      Social History   Social History  . Marital Status: Married    Spouse Name: [name]  . Number of Children: 2  . Years of Education: N/A   Occupational History  . Sales Manager-retired    Social History Main Topics  . Smoking status: Never Smoker   . Smokeless tobacco: Never Used  . Alcohol Use: No  . Drug Use: No  . Sexual Activity: Not on file   Other Topics Concern  . Not on file   Social History Narrative    Family History  Problem Relation Age of Onset  . Asthma Sister   . Prostate cancer Brother   . Hemochromatosis Brother   . Heart attack Mother 18  . Colon cancer Brother 44  . Heart attack Brother 28  . Diabetes Father     IDDM  . Stroke Father 7  . Heart attack Brother 17    Review of Systems  Constitutional: Negative for fever, chills and fatigue.  Respiratory: Negative for cough, shortness of breath and wheezing.   Cardiovascular: Positive for palpitations (occasional heart flutters) and leg swelling. Negative for chest pain.  Neurological: Negative for light-headedness, numbness and headaches.       Objective:   Filed Vitals:    10/22/15 0800  BP: 130/80  Pulse: 71  Temp: 98.7 F (37.1 C)  Resp: 16   Filed Weights   10/22/15 0800  Weight: 215 lb (97.523 kg)   Body mass index is 32.7 kg/(m^2).   Physical Exam Constitutional: Appears well-developed and well-nourished. No distress.  Neck: Neck supple. No tracheal deviation present. No thyromegaly present.  No carotid bruit. No cervical adenopathy.   Cardiovascular: Normal rate, regular rhythm and normal heart sounds.   No murmur heard.  Trace nonpitting edema Pulmonary/Chest: Effort normal and  breath sounds normal. No respiratory distress. No wheezes.  Ext:  Feet examined: Varicose veins, no lesions, 1 + DP palpable pulses, monofilament done - normal sensation both feet     Assessment & Plan:   See Problem List for Assessment and Plan of chronic medical problems.  Follow up in 6 months

## 2015-10-22 NOTE — Assessment & Plan Note (Signed)
Last a1c 7.2 - recheck today Will have glucometer checked at pharmacy Foot exam done, eye exam almost due Stressed regular exercise Decrease sugars/carbs Continue current meds Follow up in 6 months

## 2015-10-22 NOTE — Assessment & Plan Note (Signed)
Lipid panel controlled in December Recheck at next appt Continue simvastatin 40 mg daily

## 2015-10-22 NOTE — Assessment & Plan Note (Signed)
BP controlled Continue current meds Start regular exercise

## 2015-10-22 NOTE — Assessment & Plan Note (Signed)
Asymptomatic Follows with cardiology Continue current meds Stressed regular exercise

## 2015-10-22 NOTE — Patient Instructions (Signed)
  We have reviewed your prior records including labs and tests today.  Test(s) ordered today. Your results will be released to [place] (or called to you) after review, usually within 72hours after test completion. If any changes need to be made, you will be notified at that same time.  All other Health Maintenance issues reviewed.   All recommended immunizations and age-appropriate screenings are up-to-date.  No immunizations administered today.   Medications reviewed and updated.  No changes recommended at this time.  Your prescription(s) have been submitted to your pharmacy. Please take as directed and contact our office if you believe you are having problem(s) with the medication(s).   Please schedule followup in 6 months

## 2015-10-23 ENCOUNTER — Other Ambulatory Visit: Payer: Self-pay | Admitting: Internal Medicine

## 2015-10-23 ENCOUNTER — Encounter: Payer: Self-pay | Admitting: Internal Medicine

## 2015-10-23 DIAGNOSIS — E1151 Type 2 diabetes mellitus with diabetic peripheral angiopathy without gangrene: Secondary | ICD-10-CM

## 2015-10-28 ENCOUNTER — Other Ambulatory Visit: Payer: Self-pay | Admitting: Internal Medicine

## 2015-11-04 ENCOUNTER — Encounter: Payer: Self-pay | Admitting: Internal Medicine

## 2015-11-04 ENCOUNTER — Ambulatory Visit (INDEPENDENT_AMBULATORY_CARE_PROVIDER_SITE_OTHER): Payer: PPO | Admitting: Internal Medicine

## 2015-11-04 VITALS — BP 140/74 | HR 68 | Ht 66.5 in | Wt 218.1 lb

## 2015-11-04 DIAGNOSIS — K648 Other hemorrhoids: Secondary | ICD-10-CM

## 2015-11-04 DIAGNOSIS — K601 Chronic anal fissure: Secondary | ICD-10-CM

## 2015-11-04 MED ORDER — AMBULATORY NON FORMULARY MEDICATION
Status: DC
Start: 2015-11-04 — End: 2016-04-27

## 2015-11-04 NOTE — Patient Instructions (Addendum)
  HEMORRHOID BANDING PROCEDURE    FOLLOW-UP CARE   1. The procedure you have had should have been relatively painless since the banding of the area involved does not have nerve endings and there is no pain sensation.  The rubber band cuts off the blood supply to the hemorrhoid and the band may fall off as soon as 48 hours after the banding (the band may occasionally be seen in the toilet bowl following a bowel movement). You may notice a temporary feeling of fullness in the rectum which should respond adequately to plain Tylenol or Motrin.  2. Following the banding, avoid strenuous exercise that evening and resume full activity the next day.  A sitz bath (soaking in a warm tub) or bidet is soothing, and can be useful for cleansing the area after bowel movements.     3. To avoid constipation, take two tablespoons of natural wheat bran, natural oat bran, flax, Benefiber or any over the counter fiber supplement and increase your water intake to 7-8 glasses daily.    4. Unless you have been prescribed anorectal medication, do not put anything inside your rectum for two weeks: No suppositories, enemas, fingers, etc.  5. Occasionally, you may have more bleeding than usual after the banding procedure.  This is often from the untreated hemorrhoids rather than the treated one.  Don't be concerned if there is a tablespoon or so of blood.  If there is more blood than this, lie flat with your bottom higher than your head and apply an ice pack to the area. If the bleeding does not stop within a half an hour or if you feel faint, call our office at [phone] or go to the emergency room.  6. Problems are not common; however, if there is a substantial amount of bleeding, severe pain, chills, fever or difficulty passing urine (very rare) or other problems, you should call us at [phone] or report to the nearest emergency room.  7. Do not stay seated continuously for more than 2-3 hours for a day or  two after the procedure.  Tighten your buttock muscles 10-15 times every two hours and take 10-15 deep breaths every 1-2 hours.  Do not spend more than a few minutes on the toilet if you cannot empty your bowel; instead re-visit the toilet at a later time.    We have sent the following medications to your pharmacy for you to pick up at your convenience: Diltiazem , re-start this 11/08/15.   Re-start your xarelto 11/10/15 per Dr Carlean Purl.    If you have bleeding in between bowel movements call us.    Follow up with [place] in 2 months, appointment made for May 16th at 10:30AM    I appreciate the opportunity to care for you. Silvano Rusk, MD, [facility]

## 2015-11-04 NOTE — Assessment & Plan Note (Signed)
RP, LL banded Resume xarelto 3/22

## 2015-11-04 NOTE — Assessment & Plan Note (Signed)
Restart diltiazem cream

## 2015-11-04 NOTE — Progress Notes (Signed)
   Subjective:    Patient ID: Todd [name], male    DOB: 1940-09-05, 75 y.o.   MRN: [id number] Cc: rectal pain and bleeding HPI  Here for f/u anal fissure and rectal bleeding Has had banding in 2016 - helped for several months returned  Out of diltiazem gel for a little while He held his Xarelto x 2 d PTA  Says pain is a little better bleeding the same Medications, allergies, past medical history, past surgical history, family history and social history are reviewed and updated in the EMR.  Review of Systems As above    Objective:   Physical Exam BP 140/74 mmHg  Pulse 68  Ht 5' 6.5" (1.689 m)  Wt 218 lb 2 oz (98.941 kg)  BMI 34.68 kg/m2   Anoscopy was performed with the patient in the left lateral decubitus position while a chaperone was present and revealed Gr 2 RP, LL internal hemorrhoids and small anterior fissure  PROCEDURE NOTE: The patient presents with symptomatic grade 2  hemorrhoids, requesting rubber band ligation of his/her hemorrhoidal disease.  All risks, benefits and alternative forms of therapy were described and informed consent was obtained.   The anorectum was pre-medicated with NTG 0.125% The decision was made to band the RP and LL internal hemorrhoid, and the [place]thren was used to perform band ligation without complication.  Digital anorectal examination was then performed to assure proper positioning of the band, and to adjust the banded tissue as required.  The patient was discharged home without pain or other issues.  Dietary and behavioral recommendations were given and along with follow-up instructions.     The following adjunctive treatments were recommended:  Hold Xarelto (previous clearance to hold in past few months and no new events) and restart 3/22  The patient will return in 2 months for  follow-up and possible additional banding as required. No complications were encountered and the patient tolerated the procedure well.       Assessment & Plan:   Hemorrhoids, internal, with bleeding RP, LL banded Resume xarelto 3/22   Chronic anal fissure Restart diltiazem cream

## 2015-11-22 ENCOUNTER — Encounter: Payer: Self-pay | Admitting: Internal Medicine

## 2015-11-22 NOTE — Telephone Encounter (Signed)
LOV notes indicated the pt is to stay on current meds for DM.

## 2015-11-24 DIAGNOSIS — H25013 Cortical age-related cataract, bilateral: Secondary | ICD-10-CM | POA: Diagnosis not present

## 2015-11-24 DIAGNOSIS — H35032 Hypertensive retinopathy, left eye: Secondary | ICD-10-CM | POA: Diagnosis not present

## 2015-11-24 DIAGNOSIS — H2513 Age-related nuclear cataract, bilateral: Secondary | ICD-10-CM | POA: Diagnosis not present

## 2015-11-24 DIAGNOSIS — H35031 Hypertensive retinopathy, right eye: Secondary | ICD-10-CM | POA: Diagnosis not present

## 2015-11-24 DIAGNOSIS — H1013 Acute atopic conjunctivitis, bilateral: Secondary | ICD-10-CM | POA: Diagnosis not present

## 2015-11-24 DIAGNOSIS — E119 Type 2 diabetes mellitus without complications: Secondary | ICD-10-CM | POA: Diagnosis not present

## 2016-01-04 ENCOUNTER — Ambulatory Visit (INDEPENDENT_AMBULATORY_CARE_PROVIDER_SITE_OTHER): Payer: PPO | Admitting: Internal Medicine

## 2016-01-04 ENCOUNTER — Encounter: Payer: Self-pay | Admitting: Internal Medicine

## 2016-01-04 VITALS — BP 126/66 | HR 70 | Ht 66.5 in | Wt 214.0 lb

## 2016-01-04 DIAGNOSIS — K648 Other hemorrhoids: Secondary | ICD-10-CM

## 2016-01-04 DIAGNOSIS — K601 Chronic anal fissure: Secondary | ICD-10-CM | POA: Diagnosis not present

## 2016-01-04 NOTE — Assessment & Plan Note (Signed)
Doing well RTC prn

## 2016-01-04 NOTE — Patient Instructions (Signed)
  Stop your diltiazem after feeling good for a month.   Follow up with Dr Carlean Purl as needed.    I appreciate the opportunity to care for you. Silvano Rusk, MD, [facility]

## 2016-01-04 NOTE — Progress Notes (Signed)
   Subjective:    Patient ID: Todd [name], male    DOB: 04-Aug-1941, 75 y.o.   MRN: [id number] Cc: f/u hemorrhoids and anal fissure HPI Here after banding and tx of fissure 11/05/2015. Had repeat baning of RP and LL columns of internal hemorrhoids and rerx diltiazem topical. He reports that he is doing well at this time w/o sxs.  Medications, allergies, past medical history, past surgical history, family history and social history are reviewed and updated in the EMR.  Review of Systems As above    Objective:   Physical Exam BP 126/66 mmHg  Pulse 70  Ht 5' 6.5" (1.689 m)  Wt 214 lb (97.07 kg)  BMI 34.03 kg/m2     Assessment & Plan:  Chronic anal fissure Stop diltiazem after he feels really well for 1 month Bowel management RTC prn  Hemorrhoids, internal, with bleeding Doing well RTC prn   Cc"Stacy Lorretta Harp, MD

## 2016-01-04 NOTE — Assessment & Plan Note (Signed)
Stop diltiazem after he feels really well for 1 month Bowel management RTC prn

## 2016-01-09 ENCOUNTER — Encounter: Payer: Self-pay | Admitting: Internal Medicine

## 2016-04-25 ENCOUNTER — Ambulatory Visit: Payer: PPO | Admitting: Internal Medicine

## 2016-04-27 ENCOUNTER — Other Ambulatory Visit: Payer: Self-pay | Admitting: Internal Medicine

## 2016-04-27 ENCOUNTER — Ambulatory Visit (INDEPENDENT_AMBULATORY_CARE_PROVIDER_SITE_OTHER): Payer: PPO | Admitting: Internal Medicine

## 2016-04-27 ENCOUNTER — Encounter: Payer: Self-pay | Admitting: Internal Medicine

## 2016-04-27 ENCOUNTER — Other Ambulatory Visit (INDEPENDENT_AMBULATORY_CARE_PROVIDER_SITE_OTHER): Payer: PPO

## 2016-04-27 VITALS — BP 138/94 | HR 80 | Temp 97.9°F | Resp 16 | Wt 216.0 lb

## 2016-04-27 DIAGNOSIS — E785 Hyperlipidemia, unspecified: Secondary | ICD-10-CM

## 2016-04-27 DIAGNOSIS — E1151 Type 2 diabetes mellitus with diabetic peripheral angiopathy without gangrene: Secondary | ICD-10-CM | POA: Diagnosis not present

## 2016-04-27 DIAGNOSIS — I4891 Unspecified atrial fibrillation: Secondary | ICD-10-CM | POA: Diagnosis not present

## 2016-04-27 DIAGNOSIS — I10 Essential (primary) hypertension: Secondary | ICD-10-CM

## 2016-04-27 DIAGNOSIS — I779 Disorder of arteries and arterioles, unspecified: Secondary | ICD-10-CM

## 2016-04-27 DIAGNOSIS — I2510 Atherosclerotic heart disease of native coronary artery without angina pectoris: Secondary | ICD-10-CM | POA: Diagnosis not present

## 2016-04-27 DIAGNOSIS — I739 Peripheral vascular disease, unspecified: Secondary | ICD-10-CM

## 2016-04-27 LAB — COMPREHENSIVE METABOLIC PANEL
ALT: 40 U/L (ref 0–53)
AST: 40 U/L — ABNORMAL HIGH (ref 0–37)
Albumin: 4.1 g/dL (ref 3.5–5.2)
Alkaline Phosphatase: 70 U/L (ref 39–117)
BUN: 19 mg/dL (ref 6–23)
CO2: 27 mEq/L (ref 19–32)
Calcium: 9.3 mg/dL (ref 8.4–10.5)
Chloride: 104 mEq/L (ref 96–112)
Creatinine, Ser: 0.95 mg/dL (ref 0.40–1.50)
GFR: 82.54 mL/min (ref >60.00)
Glucose, Bld: 149 mg/dL — ABNORMAL HIGH (ref 70–99)
Potassium: 4.3 mEq/L (ref 3.5–5.1)
Sodium: 138 mEq/L (ref 135–145)
Total Bilirubin: 0.7 mg/dL (ref 0.2–1.2)
Total Protein: 7.5 g/dL (ref 6.0–8.3)

## 2016-04-27 LAB — LIPID PANEL
Cholesterol: 140 mg/dL (ref 0–200)
HDL: 35.7 mg/dL — ABNORMAL LOW (ref >39.00)
LDL Cholesterol: 73 mg/dL (ref 0–99)
NonHDL: 104.59
Total CHOL/HDL Ratio: 4
Triglycerides: 158 mg/dL — ABNORMAL HIGH (ref 0.0–149.0)
VLDL: 31.6 mg/dL (ref 0.0–40.0)

## 2016-04-27 LAB — MAGNESIUM: Magnesium: 1.8 mg/dL (ref 1.5–2.5)

## 2016-04-27 LAB — HEMOGLOBIN A1C: Hgb A1c MFr Bld: 8 % — ABNORMAL HIGH (ref 4.6–6.5)

## 2016-04-27 MED ORDER — METFORMIN HCL ER 500 MG PO TB24
1000.0000 mg | ORAL_TABLET | Freq: Two times a day (BID) | ORAL | 3 refills | Status: DC
Start: 2016-04-27 — End: 2016-06-09

## 2016-04-27 MED ORDER — GLIMEPIRIDE 2 MG PO TABS: ORAL_TABLET | ORAL | 1 refills | Status: DC

## 2016-04-27 NOTE — Progress Notes (Signed)
Subjective:    Patient ID: Todd [name], male    DOB: [birth year], 75 y.o.   MRN: [id number]  HPI The patient is here for follow up.  Afib, CAD, Hypertension: He is taking his medication daily. He is compliant with a low sodium diet.  He denies chest pain, palpitations, edema, shortness of breath and regular headaches. He is not exercising regularly,but push mows his lawn.  He does monitor his blood pressure at home on occasion and it was last 120's/60's.    Diabetes: He is taking his medication daily as prescribed. He is fairly compliant with a diabetic diet. He is not exercising regularly. He monitors his sugars and they have been running 155,176,164. He checks his feet daily and denies foot lesions. He is up-to-date with an ophthalmology examination.   Hyperlipidemia: He is taking his medication daily. He is compliant with a low fat/cholesterol diet. He is not exercising regularly. He denies myalgias.    Medications and allergies reviewed with patient and updated if appropriate.  Patient Active Problem List   Diagnosis Date Noted  . 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 Hemorrhoids, internal, with bleeding 01/15/2015  . Bursitis of left shoulder 01/14/2015  . Type 2 diabetes, controlled, with peripheral circulatory disorder ([place]) 12/05/2013  . PVD (peripheral vascular disease) ([place]) 11/12/2013  . Atrial fibrillation with RVR ([place]) 11/03/2013  . CAROTID ARTERY DISEASE 12/20/2009  . OVERWEIGHT/OBESITY 11/17/2008  . Hyperlipidemia 07/20/2008  . Essential hypertension 07/20/2008  . Coronary atherosclerosis 07/20/2008  . ALLERGIC RHINITIS 07/20/2008  . NEPHROLITHIASIS, HX OF 07/20/2008    Current Outpatient Prescriptions on File Prior to Visit  Medication Sig Dispense Refill  . cetirizine (ZYRTEC) 10 MG tablet Take 10 mg by mouth daily as needed for allergies.     . Cholecalciferol (VITAMIN D-3) 5000 UNITS TABS Take 5,000 Units by mouth daily.     Marland Kitchen glimepiride (AMARYL) 2 MG tablet  TAKE 1 TABLET (2 MG TOTAL) BY MOUTH DAILY BEFORE BREAKFAST. 90 tablet 1  . GLUCOSAMINE CHONDROITIN COMPLX PO Take 1 tablet by mouth 2 (two) times daily. Pt takes at night    . glucose 4 GM chewable tablet Chew 1 tablet by mouth as needed for low blood sugar.    . metFORMIN (GLUCOPHAGE-XR) 500 MG 24 hr tablet Take 1 tablet (500 mg total) by mouth 2 (two) times daily. 180 tablet 3  . metoprolol (LOPRESSOR) 50 MG tablet Take 1 tablet (50 mg total) by mouth 2 (two) times daily. 180 tablet 3  . nitroGLYCERIN (NITROSTAT) 0.4 MG SL tablet Place 1 tablet (0.4 mg total) under the tongue every 5 (five) minutes as needed for chest pain. 25 tablet 6  . ONE TOUCH ULTRA TEST test strip TEST BLOOD SUGAR ONCE DAILY 100 each 1  . ONETOUCH DELICA LANCETS 99991111 MISC TEST BLOOD SUGAR ONCE DAILY 100 each 1  . polycarbophil (FIBERCON) 625 MG tablet Take 625 mg by mouth 2 (two) times daily.     . simvastatin (ZOCOR) 40 MG tablet Take 1 tablet (40 mg total) by mouth every evening. 90 tablet 3  . XARELTO 20 MG TABS tablet Take 1 tablet (20 mg total) by mouth daily with supper. 90 tablet 3   No current facility-administered medications on file prior to visit.     Past Medical History:  Diagnosis Date  . Allergic rhinitis   . Asthma   . Atrial fibrillation with rapid ventricular response ([place])    a. newly diagnosed 11/03/13, spont conv  to NSR, placed on xarelto.  Marland Kitchen CAD (coronary artery disease)    a. 1999; s/p CABG: LIMA to LAD, SVG to OM1, SVG to OM2, SVG to RCA  b. Normal nuc 10/2013 (done because of new onset AF.)  . Diabetes mellitus ([place])   . HLD (hyperlipidemia)   . HTN (hypertension)   . Nephrolithiasis   . Overweight(278.02)   . PVD (peripheral vascular disease) ([facility])    Carotid stenosis  . Stroke [facility])     Past Surgical History:  Procedure Laterality Date  . CAROTID ENDARTERECTOMY  1999  . COLONOSCOPY  2014   negative;Dr Sharlett Iles  . COLONOSCOPY W/ POLYPECTOMY  2009   Dr Sharlett Iles  . CORONARY  ARTERY BYPASS GRAFT  Aug.1999  . HEMORRHOID BANDING    . NASAL SINUS SURGERY      Social History   Social History  . Marital status: Married    Spouse name: [name]  . Number of children: 2  . Years of education: N/A   Occupational History  . Sales Manager-retired    Social History Main Topics  . Smoking status: Never Smoker  . Smokeless tobacco: Never Used  . Alcohol use No  . Drug use: No  . Sexual activity: Not on file   Other Topics Concern  . Not on file   Social History Narrative  . No narrative on file    Family History  Problem Relation Age of Onset  . Asthma Sister   . Prostate cancer Brother   . Hemochromatosis Brother   . Heart attack Mother 64  . Colon cancer Brother 48  . Heart attack Brother 60  . Diabetes Father     IDDM  . Stroke Father 57  . Heart attack Brother 47    Review of Systems  Constitutional: Negative for fever.  Respiratory: Positive for wheezing (allergies). Negative for cough and shortness of breath.   Cardiovascular: Positive for leg swelling (occasionally). Negative for chest pain and palpitations.  Neurological: Negative for dizziness, light-headedness, numbness and headaches.       Objective:   Vitals:   04/27/16 0848  BP: (!) 138/94  Pulse: 80  Resp: 16  Temp: 97.9 F (36.6 C)   Filed Weights   04/27/16 0848  Weight: 216 lb (98 kg)   Body mass index is 34.34 kg/m.   Physical Exam    Constitutional: Appears well-developed and well-nourished. No distress.  HENT:  Head: Normocephalic and atraumatic.  Neck: Neck supple. No tracheal deviation present. No thyromegaly present.  Cardiovascular: Normal rate, regular rhythm and normal heart sounds.   No murmur heard.  No carotid bruit  Pulmonary/Chest: Effort normal and breath sounds normal. No respiratory distress. No has no wheezes. No rales.  Musculoskeletal: trace edema.  Lymphadenopathy: No cervical adenopathy.  Skin: Skin is warm and dry. Not diaphoretic.    Psychiatric: Normal mood and affect. Behavior is normal.     Assessment & Plan:    See Problem List for Assessment and Plan of chronic medical problems.

## 2016-04-27 NOTE — Assessment & Plan Note (Signed)
No chest pain Encouraged increase in exercise and weight loss Continue current medications

## 2016-04-27 NOTE — Progress Notes (Signed)
Pre visit review using our clinic review tool, if applicable. No additional management support is needed unless otherwise documented below in the visit note. 

## 2016-04-27 NOTE — Assessment & Plan Note (Signed)
Check lipid panel.  Continue simvastatin 40 mg daily. 

## 2016-04-27 NOTE — Assessment & Plan Note (Signed)
Fairly compliant with diabetic diet Stressed weight loss and regular exercise Check a1c

## 2016-04-27 NOTE — Assessment & Plan Note (Signed)
BP elevated here today, but looks controlled typically He will check BP at home weekly Continue current medications

## 2016-04-27 NOTE — Patient Instructions (Addendum)

## 2016-04-27 NOTE — Assessment & Plan Note (Signed)
Asymptomatic Rate controlled On xarelto Follows with cardio Continue current medications

## 2016-05-18 ENCOUNTER — Telehealth: Payer: Self-pay | Admitting: Cardiology

## 2016-05-18 NOTE — Telephone Encounter (Signed)
Follow up       Patient calling the office for samples of medication:   1.  What medication and dosage are you requesting samples for?  xarelto 20mg   2.  Are you currently out of this medication? Pt is in the donut hole---almost out

## 2016-05-18 NOTE — Telephone Encounter (Signed)
Returned call to patient's wife, ok per DPR. Let her know Samples of Xarelto will be left at front desk to pick up. She said they will come by next week to get them. Medication Samples have been provided to the patient.  Drug name: Xarelto       Strength: 20 mg        Qty: 4 bottles (28 tablets)  LOT: [id number]  Exp.Date: 11-19   Also addressed the carotid dopplers. Do not see order for repeat carotids at this time. Patient has f/u appt with Dr Percival Spanish in December. Advised patient to address with Dr Percival Spanish at that time. She verbalized understanding.

## 2016-05-18 NOTE — Telephone Encounter (Signed)
New message      Calling to see if pt is due for a carotid doppler.  Please call

## 2016-06-08 DIAGNOSIS — H903 Sensorineural hearing loss, bilateral: Secondary | ICD-10-CM | POA: Diagnosis not present

## 2016-06-09 ENCOUNTER — Telehealth: Payer: Self-pay | Admitting: *Deleted

## 2016-06-09 MED ORDER — METFORMIN HCL ER 500 MG PO TB24: 1000.0000 mg | ORAL_TABLET | Freq: Two times a day (BID) | ORAL | 3 refills | Status: DC

## 2016-06-09 NOTE — Telephone Encounter (Signed)
Wife left msg on triage stating MD want pt to increase his metformin 2 pills twice a day. Blase Mess is needing updated rx. [facility]...Johny Chess

## 2016-06-29 ENCOUNTER — Telehealth: Payer: Self-pay | Admitting: *Deleted

## 2016-06-29 NOTE — Telephone Encounter (Signed)
Patient presented in office today, accompanied by wife. They requested samples of Xarelto as he is in the "donut hole." We do not have any Xarelto 20mg  samples today. Patient has enough medication to last through 07/15/16. Advised for them to call back next week to check back with [place]. They were fine with this plan.

## 2016-07-26 ENCOUNTER — Encounter: Payer: Self-pay | Admitting: Cardiology

## 2016-07-30 NOTE — Progress Notes (Signed)
HPI The patient presents for follow up of CAD and atrial fibrillation.  In March of 2015 he had a stress perfusion study demonstrating no evidence of ischemia or infarct with preserved ejection fraction.  Since I last saw him he has he has done well.  The patient denies any new symptoms such as chest discomfort, neck or arm discomfort. There has been no new shortness of breath, PND or orthopnea. There have been no reported palpitations, presyncope or syncope.    He walks for exercise.      Allergies  Allergen Reactions  . Amlodipine Besylate     Rash Because of a history of documented adverse serious drug reaction;Medi Alert bracelet  is recommended  . Ivp Dye [Iodinated Diagnostic Agents]     Rash Because of a history of documented adverse serious drug reaction;Medi Alert bracelet  is recommended    Current Outpatient Prescriptions  Medication Sig Dispense Refill  . cetirizine (ZYRTEC) 10 MG tablet Take 10 mg by mouth daily as needed for allergies.     . Cholecalciferol (VITAMIN D-3) 5000 UNITS TABS Take 5,000 Units by mouth daily.     Marland Kitchen glimepiride (AMARYL) 2 MG tablet TAKE 1 TABLET (2 MG TOTAL) BY MOUTH DAILY BEFORE BREAKFAST. 90 tablet 1  . GLUCOSAMINE CHONDROITIN COMPLX PO Take 1 tablet by mouth 2 (two) times daily. Pt takes at night    . glucose 4 GM chewable tablet Chew 1 tablet by mouth as needed for low blood sugar.    . metFORMIN (GLUCOPHAGE-XR) 500 MG 24 hr tablet Take 2 tablets (1,000 mg total) by mouth 2 (two) times daily. 360 tablet 3  . metoprolol (LOPRESSOR) 50 MG tablet Take 1 tablet (50 mg total) by mouth 2 (two) times daily. 180 tablet 3  . nitroGLYCERIN (NITROSTAT) 0.4 MG SL tablet Place 1 tablet (0.4 mg total) under the tongue every 5 (five) minutes as needed for chest pain. 25 tablet 6  . ONE TOUCH ULTRA TEST test strip TEST BLOOD SUGAR ONCE DAILY 100 each 1  . ONETOUCH DELICA LANCETS 99991111 MISC TEST BLOOD SUGAR ONCE DAILY 100 each 1  . polycarbophil (FIBERCON) 625  MG tablet Take 625 mg by mouth 2 (two) times daily.     . simvastatin (ZOCOR) 40 MG tablet Take 1 tablet (40 mg total) by mouth every evening. 90 tablet 3  . XARELTO 20 MG TABS tablet Take 1 tablet (20 mg total) by mouth daily with supper. 90 tablet 3   No current facility-administered medications for this visit.     Past Medical History:  Diagnosis Date  . Allergic rhinitis   . Asthma   . Atrial fibrillation with rapid ventricular response ([place])    a. newly diagnosed 11/03/13, spont conv to NSR, placed on xarelto.  Marland Kitchen CAD (coronary artery disease)    a. 1999; s/p CABG: LIMA to LAD, SVG to OM1, SVG to OM2, SVG to RCA  b. Normal nuc 10/2013 (done because of new onset AF.)  . Diabetes mellitus ([place])   . HLD (hyperlipidemia)   . HTN (hypertension)   . Nephrolithiasis   . Overweight(278.02)   . PVD (peripheral vascular disease) ([facility])    Carotid stenosis  . Stroke [facility])     Past Surgical History:  Procedure Laterality Date  . CAROTID ENDARTERECTOMY  1999  . COLONOSCOPY  2014   negative;Dr Sharlett Iles  . COLONOSCOPY W/ POLYPECTOMY  2009   Dr Sharlett Iles  . CORONARY ARTERY BYPASS GRAFT  Aug.1999  .  HEMORRHOID BANDING    . NASAL SINUS SURGERY      ROS:    As stated in the HPI and negative for all other systems.  PHYSICAL EXAM BP (!) 148/84   Pulse 66   Ht '5\' 8"'$  (1.727 m)   Wt 207 lb (93.9 kg)   BMI 31.47 kg/m  GENERAL:  Well appearing NECK:  No jugular venous distention, waveform within normal limits, carotid upstroke brisk and symmetric, no bruits, no thyromegaly LUNGS:  Clear to auscultation bilaterally CHEST:  Well healed sternotomy scar. HEART:  PMI not displaced or sustained,S1 and S2 within normal limits, no S3, no S4, no clicks, no rubs, no murmurs ABD:  Flat, positive bowel sounds normal in frequency in pitch, no bruits, no rebound, no guarding, no midline pulsatile mass, no hepatomegaly, no splenomegaly EXT:  2 plus pulses throughout, no edema, no cyanosis no  clubbing  Lab Results  Component Value Date   CHOL 140 04/27/2016   TRIG 158.0 (H) 04/27/2016   HDL 35.70 (L) 04/27/2016   LDLCALC 73 04/27/2016   Lab Results  Component Value Date   HGBA1C 8.0 (H) 04/27/2016    EKG:  Sinus rhythm, rate 66  axis within normal limits, intervals within normal limits, no acute ST-T wave changes.   07/31/2016  ASSESSMENT AND PLAN  ATRIAL FIB - The patient had had no symptomatic recurrence of this. Mr. Yussef Vonasek Rigor has a CHA2DS2 - VASc score of 6 he will remain on anticoagulatoin.   C A D -  The patient has no new sypmtoms since stress testing in 2015.Marland Kitchen  No further cardiovascular testing is indicated. We will continue with aggressive risk reduction and meds as listed.   CAROTID ARTERY DISEASE -  In Sept of last year he had sttable 40-59% RICA stenosis and 123456 LICA stenosis, s/p CEA with DPA. I will follow up in Sept 2018.    HYPERLIPIDEMIA -  Labs as below.  He will continue the meds as listed.  OVERWEIGHT/OBESITY -   He understands the need to lose weight.  He has lost a little.    HYPERTENSION - The blood pressure is slightly elevated today but this is unusual. No change in medications is indicated. We will continue with therapeutic lifestyle changes (TLC).  DM A1C was 8.0.  We talked about increasing the exercise to further manage this.  This is creeping up.

## 2016-07-31 ENCOUNTER — Encounter: Payer: Self-pay | Admitting: Cardiology

## 2016-07-31 ENCOUNTER — Ambulatory Visit (INDEPENDENT_AMBULATORY_CARE_PROVIDER_SITE_OTHER): Payer: PPO | Admitting: Cardiology

## 2016-07-31 VITALS — BP 148/84 | HR 66 | Ht 68.0 in | Wt 207.0 lb

## 2016-07-31 DIAGNOSIS — I480 Paroxysmal atrial fibrillation: Secondary | ICD-10-CM | POA: Diagnosis not present

## 2016-07-31 DIAGNOSIS — I739 Peripheral vascular disease, unspecified: Secondary | ICD-10-CM

## 2016-07-31 DIAGNOSIS — I10 Essential (primary) hypertension: Secondary | ICD-10-CM

## 2016-07-31 DIAGNOSIS — I2510 Atherosclerotic heart disease of native coronary artery without angina pectoris: Secondary | ICD-10-CM

## 2016-07-31 DIAGNOSIS — I779 Disorder of arteries and arterioles, unspecified: Secondary | ICD-10-CM | POA: Diagnosis not present

## 2016-07-31 MED ORDER — METOPROLOL TARTRATE 50 MG PO TABS: 50.0000 mg | ORAL_TABLET | Freq: Two times a day (BID) | ORAL | 3 refills | Status: DC

## 2016-07-31 MED ORDER — NITROGLYCERIN 0.4 MG SL SUBL: 0.4000 mg | SUBLINGUAL_TABLET | SUBLINGUAL | 3 refills | Status: DC | PRN

## 2016-07-31 MED ORDER — SIMVASTATIN 40 MG PO TABS: 40.0000 mg | ORAL_TABLET | Freq: Every evening | ORAL | 3 refills | Status: DC

## 2016-07-31 NOTE — Patient Instructions (Addendum)

## 2016-08-02 ENCOUNTER — Encounter: Payer: Self-pay | Admitting: Cardiology

## 2016-08-02 ENCOUNTER — Telehealth: Payer: Self-pay | Admitting: *Deleted

## 2016-08-02 NOTE — Telephone Encounter (Signed)
[facility]in from [facility] left a msg on the refill vm questioning if #25 of nitro will be a three month supply for the patient as they do not have a history of refilling this for him. She requests a call back at [phone] and requested that in the event that she is unavailable please leave a detailed vm including agents first and last name as it is [place]. Thanks, MI

## 2016-08-03 ENCOUNTER — Telehealth: Payer: Self-pay | Admitting: Cardiology

## 2016-08-03 ENCOUNTER — Ambulatory Visit: Payer: PPO | Admitting: Cardiology

## 2016-08-03 MED ORDER — NITROGLYCERIN 0.4 MG SL SUBL: 0.4000 mg | SUBLINGUAL_TABLET | SUBLINGUAL | 3 refills | Status: DC | PRN

## 2016-08-03 NOTE — Telephone Encounter (Signed)
Lattie Haw from [facility] calling in reference to pt nitrostat prescription. Would like a 90 day supply since they are the pt mail order.   Thanks!

## 2016-08-03 NOTE — Telephone Encounter (Signed)
Returned call to pharmacy and left message to disregard Rx and sent new NTG Rx for #[phone]

## 2016-08-17 ENCOUNTER — Other Ambulatory Visit: Payer: Self-pay | Admitting: Internal Medicine

## 2016-08-21 HISTORY — PX: INTRACAPSULAR CATARACT EXTRACTION: SHX361

## 2016-09-13 DIAGNOSIS — L821 Other seborrheic keratosis: Secondary | ICD-10-CM | POA: Diagnosis not present

## 2016-09-13 DIAGNOSIS — I872 Venous insufficiency (chronic) (peripheral): Secondary | ICD-10-CM | POA: Diagnosis not present

## 2016-10-11 ENCOUNTER — Encounter: Payer: Self-pay | Admitting: Family Medicine

## 2016-10-11 ENCOUNTER — Ambulatory Visit (INDEPENDENT_AMBULATORY_CARE_PROVIDER_SITE_OTHER): Payer: PPO | Admitting: Family Medicine

## 2016-10-11 VITALS — BP 140/70 | HR 109 | Temp 100.1°F | Ht 68.0 in | Wt 208.0 lb

## 2016-10-11 DIAGNOSIS — R509 Fever, unspecified: Secondary | ICD-10-CM

## 2016-10-11 DIAGNOSIS — J101 Influenza due to other identified influenza virus with other respiratory manifestations: Secondary | ICD-10-CM | POA: Diagnosis not present

## 2016-10-11 LAB — POCT INFLUENZA A/B
Influenza A, POC: POSITIVE — AB
Influenza B, POC: NEGATIVE

## 2016-10-11 MED ORDER — OSELTAMIVIR PHOSPHATE 75 MG PO CAPS: 75.0000 mg | ORAL_CAPSULE | Freq: Two times a day (BID) | ORAL | 0 refills | Status: DC

## 2016-10-11 NOTE — Progress Notes (Signed)
Pre visit review using our clinic review tool, if applicable. No additional management support is needed unless otherwise documented below in the visit note. 

## 2016-10-11 NOTE — Patient Instructions (Signed)

## 2016-10-11 NOTE — Progress Notes (Signed)
Subjective:     Patient ID: Todd [name], male   DOB: September 12, 1940, 76 y.o.   MRN: [id number]  HPI Patient's seen for acute visit. Onset yesterday of fever, chills, myalgias, headache, cough. Cough has been dry. Wife has similar symptoms. Denies any nausea or vomiting. Mild sore throat.  Past Medical History:  Diagnosis Date  . Allergic rhinitis   . Asthma   . Atrial fibrillation with rapid ventricular response ([place])    a. newly diagnosed 11/03/13, spont conv to NSR, placed on xarelto.  Marland Kitchen CAD (coronary artery disease)    a. 1999; s/p CABG: LIMA to LAD, SVG to OM1, SVG to OM2, SVG to RCA  b. Normal nuc 10/2013 (done because of new onset AF.)  . Diabetes mellitus ([place])   . HLD (hyperlipidemia)   . HTN (hypertension)   . Nephrolithiasis   . Overweight(278.02)   . PVD (peripheral vascular disease) ([facility])    Carotid stenosis  . Stroke [facility])    Past Surgical History:  Procedure Laterality Date  . CAROTID ENDARTERECTOMY  1999  . COLONOSCOPY  2014   negative;Dr Sharlett Iles  . COLONOSCOPY W/ POLYPECTOMY  2009   Dr Sharlett Iles  . CORONARY ARTERY BYPASS GRAFT  Aug.1999  . HEMORRHOID BANDING    . NASAL SINUS SURGERY      reports that he has never smoked. He has never used smokeless tobacco. He reports that he does not drink alcohol or use drugs. family history includes Asthma in his sister; Colon cancer (age of onset: 80) in his brother; Diabetes in his father; Heart attack (age of onset: 73) in his brother; Heart attack (age of onset: 60) in his brother and mother; Hemochromatosis in his brother; Prostate cancer in his brother; Stroke (age of onset: 18) in his father. Allergies  Allergen Reactions  . Amlodipine Besylate     Rash Because of a history of documented adverse serious drug reaction;Medi Alert bracelet  is recommended  . Ivp Dye [Iodinated Diagnostic Agents]     Rash Because of a history of documented adverse serious drug reaction;Medi Alert bracelet  is recommended     Review of  Systems  Constitutional: Positive for chills, fatigue and fever.  HENT: Positive for congestion and sore throat.   Respiratory: Positive for cough.        Objective:   Physical Exam  Constitutional: He appears well-developed and well-nourished.  HENT:  Right Ear: External ear normal.  Left Ear: External ear normal.  Mouth/Throat: Oropharynx is clear and moist.  Neck: Neck supple.  Cardiovascular: Normal rate and regular rhythm.   Pulmonary/Chest: Effort normal and breath sounds normal. No respiratory distress. He has no wheezes. He has no rales.       Assessment:     Acute febrile illness. Positive screen for influenza A    Plan:     -Tamiflu 75 mg twice daily for 5 days -Stay well-hydrated -Follow-up for any worsening or persistent symptoms  Eulas Post MD [facility]

## 2016-10-20 ENCOUNTER — Ambulatory Visit (INDEPENDENT_AMBULATORY_CARE_PROVIDER_SITE_OTHER)
Admission: RE | Admit: 2016-10-20 | Discharge: 2016-10-20 | Disposition: A | Discharge status: Home / Self Care | Payer: PPO | Source: Ambulatory Visit | Attending: Family | Admitting: Family

## 2016-10-20 ENCOUNTER — Encounter: Payer: Self-pay | Admitting: Family

## 2016-10-20 ENCOUNTER — Ambulatory Visit (INDEPENDENT_AMBULATORY_CARE_PROVIDER_SITE_OTHER): Payer: PPO | Admitting: Family

## 2016-10-20 DIAGNOSIS — R05 Cough: Secondary | ICD-10-CM

## 2016-10-20 DIAGNOSIS — R059 Cough, unspecified: Secondary | ICD-10-CM

## 2016-10-20 NOTE — Assessment & Plan Note (Signed)
Symptoms and exam consistent with resolving influenza. Concern for pneumonia with adventitious lung sounds noted on exam. Obtain x-ray. Continue management with over the counter medications as needed for symptom relief and supportive care pending x-ray results.

## 2016-10-20 NOTE — Progress Notes (Signed)
Subjective:    Patient ID: Todd [name], male    DOB: [birth year], 76 y.o.   MRN: [id number]  Chief Complaint  Patient presents with  . Cough    cough and congestion     HPI:  Todd [name] is a 76 y.o. male who  has a past medical history of Allergic rhinitis; Asthma; Atrial fibrillation with rapid ventricular response ([place]); CAD (coronary artery disease); Diabetes mellitus ([place]); HLD (hyperlipidemia); HTN (hypertension); Nephrolithiasis; Overweight(278.02); PVD (peripheral vascular disease) ([place]); and Stroke ([place]). and presents today for a follow up office visit.   Recently evaluated in the office with fever, chills, myalgias, headache, and cough. Influenza tested positive and was started on Tamiflu for 5 days. Reports taking the medication as prescribed and denies adverse side effects. Continue to experience associated symptoms of cough and congestion. Denies any fevers. He reports that he is feeling improved today. Does continue to cough.   Allergies  Allergen Reactions  . Amlodipine Besylate     Rash Because of a history of documented adverse serious drug reaction;Medi Alert bracelet  is recommended  . Ivp Dye [Iodinated Diagnostic Agents]     Rash Because of a history of documented adverse serious drug reaction;Medi Alert bracelet  is recommended      Outpatient Medications Prior to Visit  Medication Sig Dispense Refill  . cetirizine (ZYRTEC) 10 MG tablet Take 10 mg by mouth daily as needed for allergies.     . Cholecalciferol (VITAMIN D-3) 5000 UNITS TABS Take 5,000 Units by mouth daily.     Marland Kitchen glimepiride (AMARYL) 2 MG tablet TAKE 1 TABLET (2 MG TOTAL) BY MOUTH DAILY BEFORE BREAKFAST. 90 tablet 1  . GLUCOSAMINE CHONDROITIN COMPLX PO Take 1 tablet by mouth 2 (two) times daily. Pt takes at night    . glucose 4 GM chewable tablet Chew 1 tablet by mouth as needed for low blood sugar.    . metFORMIN (GLUCOPHAGE-XR) 500 MG 24 hr tablet Take 2 tablets (1,000 mg total) by mouth 2  (two) times daily. 360 tablet 3  . metoprolol (LOPRESSOR) 50 MG tablet Take 1 tablet (50 mg total) by mouth 2 (two) times daily. 180 tablet 3  . nitroGLYCERIN (NITROSTAT) 0.4 MG SL tablet Place 1 tablet (0.4 mg total) under the tongue every 5 (five) minutes as needed for chest pain. 100 tablet 3  . ONE TOUCH ULTRA TEST test strip TEST BLOOD SUGAR ONCE DAILY 100 each 2  . ONETOUCH DELICA LANCETS 99991111 MISC TEST BLOOD SUGAR ONCE DAILY 100 each 1  . polycarbophil (FIBERCON) 625 MG tablet Take 625 mg by mouth 2 (two) times daily.     . simvastatin (ZOCOR) 40 MG tablet Take 1 tablet (40 mg total) by mouth every evening. 90 tablet 3  . XARELTO 20 MG TABS tablet Take 1 tablet (20 mg total) by mouth daily with supper. 90 tablet 3  . oseltamivir (TAMIFLU) 75 MG capsule Take 1 capsule (75 mg total) by mouth 2 (two) times daily. 10 capsule 0   No facility-administered medications prior to visit.      Review of Systems  Constitutional: Negative for chills and fever.  HENT: Positive for congestion. Negative for ear pain, sinus pain, sinus pressure and sore throat.   Respiratory: Positive for cough. Negative for chest tightness and shortness of breath.   Neurological: Negative for headaches.      Objective:    BP 136/78 (BP Location: Left Arm, Patient Position: Sitting, Cuff Size: Large)  Pulse 93   Temp 98.7 F (37.1 C) (Oral)   Resp 16   Ht '5\' 8"'$  (1.727 m)   Wt 205 lb (93 kg)   SpO2 96%   BMI 31.17 kg/m  Nursing note and vital signs reviewed.  Physical Exam  Constitutional: He is oriented to person, place, and time. He appears well-developed and well-nourished.  Non-toxic appearance. He does not have a sickly appearance. He does not appear ill. No distress.  HENT:  Right Ear: Hearing, tympanic membrane, external ear and ear canal normal.  Left Ear: Hearing, tympanic membrane, external ear and ear canal normal.  Nose: Nose normal. Right sinus exhibits no maxillary sinus tenderness and no  frontal sinus tenderness. Left sinus exhibits no maxillary sinus tenderness and no frontal sinus tenderness.  Mouth/Throat: Uvula is midline, oropharynx is clear and moist and mucous membranes are normal.  Cardiovascular: Normal rate, regular rhythm, normal heart sounds and intact distal pulses.   Pulmonary/Chest: Effort normal. No respiratory distress. He has no wheezes. He has rales. He exhibits no tenderness.  Neurological: He is alert and oriented to person, place, and time.  Skin: Skin is warm and dry.  Psychiatric: He has a normal mood and affect. His behavior is normal. Judgment and thought content normal.       Assessment & Plan:   Problem List Items Addressed This Visit      Other   Cough    Symptoms and exam consistent with resolving influenza. Concern for pneumonia with adventitious lung sounds noted on exam. Obtain x-ray. Continue management with over the counter medications as needed for symptom relief and supportive care pending x-ray results.       Relevant Orders   DG Chest 2 View       I have discontinued Mr. Marini's oseltamivir. I am also having him maintain his polycarbophil, Vitamin D-3, cetirizine, glucose, GLUCOSAMINE CHONDROITIN COMPLX PO, XARELTO, ONETOUCH DELICA LANCETS 99991111, glimepiride, metFORMIN, simvastatin, metoprolol, nitroGLYCERIN, and ONE TOUCH ULTRA TEST.   Follow-up: Return if symptoms worsen or fail to improve.  Mauricio Po, FNP

## 2016-10-20 NOTE — Patient Instructions (Addendum)
Thank you for choosing [facility].  SUMMARY AND INSTRUCTIONS:  Medication:  Your prescription(s) have been submitted to your pharmacy or been printed and provided for you. Please take as directed and contact our office if you believe you are having problem(s) with the medication(s) or have any questions.  Follow up:  If your symptoms worsen or fail to improve, please contact our office for further instruction, or in case of emergency go directly to the emergency room at the closest medical facility.    General Recommendations:    Please drink plenty of fluids.  Get plenty of rest   Sleep in humidified air  Use saline nasal sprays  Netti pot   OTC Medications:  Decongestants - helps relieve congestion   Flonase (generic fluticasone) or Nasacort (generic triamcinolone) - please make sure to use the "cross-over" technique at a 45 degree angle towards the opposite eye as opposed to straight up the nasal passageway.   Sudafed (generic pseudoephedrine - Note this is the one that is available behind the pharmacy counter); Products with phenylephrine (-PE) may also be used but is often not as effective as pseudoephedrine.   If you have HIGH BLOOD PRESSURE - Coricidin HBP; AVOID any product that is -D as this contains pseudoephedrine which may increase your blood pressure.  Afrin (oxymetazoline) every 6-8 hours for up to 3 days.   Allergies - helps relieve runny nose, itchy eyes and sneezing   Claritin (generic loratidine), Allegra (fexofenidine), or Zyrtec (generic cyrterizine) for runny nose. These medications should not cause drowsiness.  Note - Benadryl (generic diphenhydramine) may be used however may cause drowsiness  Cough -   Delsym or Robitussin (generic dextromethorphan)  Expectorants - helps loosen mucus to ease removal   Mucinex (generic guaifenesin) as directed on the package.  Headaches / General Aches   Tylenol (generic acetaminophen) - DO NOT  EXCEED 3 grams (3,000 mg) in a 24 hour time period  Advil/Motrin (generic ibuprofen)   Sore Throat -   Salt water gargle   Chloraseptic (generic benzocaine) spray or lozenges / Sucrets (generic dyclonine)     

## 2016-10-23 ENCOUNTER — Telehealth: Payer: Self-pay | Admitting: Family

## 2016-10-23 NOTE — Telephone Encounter (Signed)
Spoke with [name] (spouse), she aware of test result.

## 2016-10-23 NOTE — Telephone Encounter (Signed)
Pts wife called checking on Chest Xray results. Please call them with the results. Thanks [employer]

## 2016-10-23 NOTE — Telephone Encounter (Signed)
Please inform patient that his chest x-rays showed no evidence of pneumonia and no further treatment is needed at this time unless symptoms worsen.

## 2016-10-25 ENCOUNTER — Encounter: Payer: Self-pay | Admitting: Internal Medicine

## 2016-10-25 ENCOUNTER — Ambulatory Visit (INDEPENDENT_AMBULATORY_CARE_PROVIDER_SITE_OTHER): Payer: PPO | Admitting: Internal Medicine

## 2016-10-25 ENCOUNTER — Other Ambulatory Visit (INDEPENDENT_AMBULATORY_CARE_PROVIDER_SITE_OTHER): Payer: PPO

## 2016-10-25 VITALS — BP 128/78 | HR 81 | Temp 98.1°F | Resp 16 | Wt 204.0 lb

## 2016-10-25 DIAGNOSIS — I10 Essential (primary) hypertension: Secondary | ICD-10-CM | POA: Diagnosis not present

## 2016-10-25 DIAGNOSIS — E7800 Pure hypercholesterolemia, unspecified: Secondary | ICD-10-CM

## 2016-10-25 DIAGNOSIS — E1151 Type 2 diabetes mellitus with diabetic peripheral angiopathy without gangrene: Secondary | ICD-10-CM

## 2016-10-25 LAB — COMPREHENSIVE METABOLIC PANEL
ALT: 22 U/L (ref 0–53)
AST: 31 U/L (ref 0–37)
Albumin: 4 g/dL (ref 3.5–5.2)
Alkaline Phosphatase: 63 U/L (ref 39–117)
BUN: 21 mg/dL (ref 6–23)
CO2: 24 mEq/L (ref 19–32)
Calcium: 9.9 mg/dL (ref 8.4–10.5)
Chloride: 105 mEq/L (ref 96–112)
Creatinine, Ser: 0.93 mg/dL (ref 0.40–1.50)
GFR: 84.48 mL/min (ref >60.00)
Glucose, Bld: 111 mg/dL — ABNORMAL HIGH (ref 70–99)
Potassium: 4.4 mEq/L (ref 3.5–5.1)
Sodium: 140 mEq/L (ref 135–145)
Total Bilirubin: 0.4 mg/dL (ref 0.2–1.2)
Total Protein: 8.1 g/dL (ref 6.0–8.3)

## 2016-10-25 LAB — MICROALBUMIN / CREATININE URINE RATIO
Creatinine,U: 255.6 mg/dL
Microalb Creat Ratio: 0.9 mg/g (ref 0.0–30.0)
Microalb, Ur: 2.2 mg/dL — ABNORMAL HIGH (ref 0.0–1.9)

## 2016-10-25 LAB — LIPID PANEL
Cholesterol: 112 mg/dL (ref 0–200)
HDL: 31.1 mg/dL — ABNORMAL LOW (ref >39.00)
LDL Cholesterol: 60 mg/dL (ref 0–99)
NonHDL: 81.28
Total CHOL/HDL Ratio: 4
Triglycerides: 106 mg/dL (ref 0.0–149.0)
VLDL: 21.2 mg/dL (ref 0.0–40.0)

## 2016-10-25 LAB — HEMOGLOBIN A1C: Hgb A1c MFr Bld: 7.1 % — ABNORMAL HIGH (ref 4.6–6.5)

## 2016-10-25 NOTE — Patient Instructions (Signed)

## 2016-10-25 NOTE — Assessment & Plan Note (Signed)
Check a1c Low sugar / carb diet Stressed regular exercise, weight loss  

## 2016-10-25 NOTE — Progress Notes (Signed)
Subjective:    Patient ID: Todd [name], male    DOB: 1941-04-16, 76 y.o.   MRN: [id number]  HPI The patient is here for follow up.  Diabetes: He is taking his medication daily as prescribed. We increased the metformin 6 months ago.  He is compliant with a diabetic diet. He is not exercising regularly - walks in the warmer weather. He monitors his sugars and they have been running 100-150 - usually 120-130's. He checks his feet daily and denies foot lesions. He is up-to-date with an ophthalmology examination.   Afib, CAD, Hypertension: He is taking his medication daily. He is compliant with a low sodium diet.  He denies chest pain, palpitations, edema, shortness of breath and regular headaches. He is not exercising regularly.  He does not monitor his blood pressure at home.    Hyperlipidemia: He is taking his medication daily. He is compliant with a low fat/cholesterol diet. He is not exercising regularly. He denies myalgias.    Medications and allergies reviewed with patient and updated if appropriate.  Patient Active Problem List   Diagnosis Date Noted  . 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 Hemorrhoids, internal, with bleeding 01/15/2015  . Bursitis of left shoulder 01/14/2015  . Type 2 diabetes, controlled, with peripheral circulatory disorder ([place]) 12/05/2013  . PVD (peripheral vascular disease) ([place]) 11/12/2013  . Atrial fibrillation with RVR ([place]) 11/03/2013  . Carotid artery disease ([place]) 12/20/2009  . OVERWEIGHT/OBESITY 11/17/2008  . Hyperlipidemia 07/20/2008  . Essential hypertension 07/20/2008  . Coronary atherosclerosis 07/20/2008  . ALLERGIC RHINITIS 07/20/2008  . NEPHROLITHIASIS, HX OF 07/20/2008    Current Outpatient Prescriptions on File Prior to Visit  Medication Sig Dispense Refill  . cetirizine (ZYRTEC) 10 MG tablet Take 10 mg by mouth daily as needed for allergies.     . Cholecalciferol (VITAMIN D-3) 5000 UNITS TABS Take 5,000 Units by mouth daily.     Marland Kitchen  glimepiride (AMARYL) 2 MG tablet TAKE 1 TABLET (2 MG TOTAL) BY MOUTH DAILY BEFORE BREAKFAST. 90 tablet 1  . GLUCOSAMINE CHONDROITIN COMPLX PO Take 1 tablet by mouth 2 (two) times daily. Pt takes at night    . glucose 4 GM chewable tablet Chew 1 tablet by mouth as needed for low blood sugar.    . metFORMIN (GLUCOPHAGE-XR) 500 MG 24 hr tablet Take 2 tablets (1,000 mg total) by mouth 2 (two) times daily. 360 tablet 3  . metoprolol (LOPRESSOR) 50 MG tablet Take 1 tablet (50 mg total) by mouth 2 (two) times daily. 180 tablet 3  . nitroGLYCERIN (NITROSTAT) 0.4 MG SL tablet Place 1 tablet (0.4 mg total) under the tongue every 5 (five) minutes as needed for chest pain. 100 tablet 3  . ONE TOUCH ULTRA TEST test strip TEST BLOOD SUGAR ONCE DAILY 100 each 2  . ONETOUCH DELICA LANCETS 99991111 MISC TEST BLOOD SUGAR ONCE DAILY 100 each 1  . polycarbophil (FIBERCON) 625 MG tablet Take 625 mg by mouth 2 (two) times daily.     . simvastatin (ZOCOR) 40 MG tablet Take 1 tablet (40 mg total) by mouth every evening. 90 tablet 3  . XARELTO 20 MG TABS tablet Take 1 tablet (20 mg total) by mouth daily with supper. 90 tablet 3   No current facility-administered medications on file prior to visit.     Past Medical History:  Diagnosis Date  . Allergic rhinitis   . Asthma   . Atrial fibrillation with rapid ventricular response ([place])  a. newly diagnosed 11/03/13, spont conv to NSR, placed on xarelto.  Marland Kitchen CAD (coronary artery disease)    a. 1999; s/p CABG: LIMA to LAD, SVG to OM1, SVG to OM2, SVG to RCA  b. Normal nuc 10/2013 (done because of new onset AF.)  . Diabetes mellitus ([place])   . HLD (hyperlipidemia)   . HTN (hypertension)   . Nephrolithiasis   . Overweight(278.02)   . PVD (peripheral vascular disease) ([facility])    Carotid stenosis  . Stroke [facility])     Past Surgical History:  Procedure Laterality Date  . CAROTID ENDARTERECTOMY  1999  . COLONOSCOPY  2014   negative;Dr Sharlett Iles  . COLONOSCOPY W/ POLYPECTOMY   2009   Dr Sharlett Iles  . CORONARY ARTERY BYPASS GRAFT  Aug.1999  . HEMORRHOID BANDING    . NASAL SINUS SURGERY      Social History   Social History  . Marital status: Married    Spouse name: [name]  . Number of children: 2  . Years of education: N/A   Occupational History  . Sales Manager-retired    Social History Main Topics  . Smoking status: Never Smoker  . Smokeless tobacco: Never Used  . Alcohol use No  . Drug use: No  . Sexual activity: Not Asked   Other Topics Concern  . None   Social History Narrative  . None    Family History  Problem Relation Age of Onset  . Prostate cancer Brother   . Hemochromatosis Brother   . Colon cancer Brother 30  . Heart attack Brother 62  . Asthma Sister   . Heart attack Mother 63  . Heart attack Brother 78  . Diabetes Father     IDDM  . Stroke Father 43    Review of Systems  Constitutional: Positive for appetite change (slightly decreased since flu) and fatigue (improving). Negative for chills and fever.  Respiratory: Positive for cough (residual from flu). Negative for shortness of breath and wheezing.   Cardiovascular: Negative for chest pain, palpitations and leg swelling.  Gastrointestinal: Negative for abdominal pain and diarrhea.  Neurological: Negative for light-headedness and headaches.       Objective:   Vitals:   10/25/16 0844  BP: 128/78  Pulse: 81  Resp: 16  Temp: 98.1 F (36.7 C)   Wt Readings from Last 3 Encounters:  10/25/16 204 lb (92.5 kg)  10/20/16 205 lb (93 kg)  10/11/16 208 lb (94.3 kg)   Body mass index is 31.02 kg/m.   Physical Exam    Constitutional: Appears well-developed and well-nourished. No distress.  HENT:  Head: Normocephalic and atraumatic.  Neck: Neck supple. No tracheal deviation present. No thyromegaly present.  No cervical lymphadenopathy Cardiovascular: Normal rate, regular rhythm and normal heart sounds.   No murmur heard. No carotid bruit .  No  edema Pulmonary/Chest: Effort normal and breath sounds normal. No respiratory distress. No has no wheezes. No rales.  Skin: Skin is warm and dry. Not diaphoretic.  Psychiatric: Normal mood and affect. Behavior is normal.      Assessment & Plan:    See Problem List for Assessment and Plan of chronic medical problems.

## 2016-10-25 NOTE — Progress Notes (Signed)
Pre visit review using our clinic review tool, if applicable. No additional management support is needed unless otherwise documented below in the visit note. 

## 2016-10-25 NOTE — Assessment & Plan Note (Signed)
Check lipid panel  Continue daily statin Regular exercise and healthy diet encouraged  

## 2016-10-25 NOTE — Assessment & Plan Note (Signed)
BP well controlled Current regimen effective and well tolerated Continue current medications at current doses cmp  

## 2016-11-02 ENCOUNTER — Other Ambulatory Visit: Payer: Self-pay | Admitting: Cardiology

## 2016-11-13 ENCOUNTER — Other Ambulatory Visit: Payer: Self-pay | Admitting: *Deleted

## 2016-11-13 MED ORDER — GLIMEPIRIDE 2 MG PO TABS: ORAL_TABLET | ORAL | 1 refills | Status: DC

## 2016-11-13 NOTE — Telephone Encounter (Signed)
Rec'd call pt wife states [facility] is [facility]. Verified chart pt is updated inform wife sending script to mail service as we speak...Todd [name]

## 2016-11-30 DIAGNOSIS — H35033 Hypertensive retinopathy, bilateral: Secondary | ICD-10-CM | POA: Diagnosis not present

## 2016-11-30 DIAGNOSIS — H25013 Cortical age-related cataract, bilateral: Secondary | ICD-10-CM | POA: Diagnosis not present

## 2016-11-30 DIAGNOSIS — E119 Type 2 diabetes mellitus without complications: Secondary | ICD-10-CM | POA: Diagnosis not present

## 2016-11-30 DIAGNOSIS — H2513 Age-related nuclear cataract, bilateral: Secondary | ICD-10-CM | POA: Diagnosis not present

## 2016-11-30 LAB — HM DIABETES EYE EXAM

## 2016-12-04 ENCOUNTER — Encounter: Payer: Self-pay | Admitting: Internal Medicine

## 2016-12-04 ENCOUNTER — Ambulatory Visit (INDEPENDENT_AMBULATORY_CARE_PROVIDER_SITE_OTHER): Payer: PPO | Admitting: Internal Medicine

## 2016-12-04 ENCOUNTER — Ambulatory Visit (INDEPENDENT_AMBULATORY_CARE_PROVIDER_SITE_OTHER)
Admission: RE | Admit: 2016-12-04 | Discharge: 2016-12-04 | Disposition: A | Discharge status: Home / Self Care | Payer: PPO | Source: Ambulatory Visit | Attending: Internal Medicine | Admitting: Internal Medicine

## 2016-12-04 VITALS — BP 138/80 | HR 83 | Temp 98.3°F | Resp 16 | Wt 202.0 lb

## 2016-12-04 DIAGNOSIS — R05 Cough: Secondary | ICD-10-CM | POA: Diagnosis not present

## 2016-12-04 DIAGNOSIS — R059 Cough, unspecified: Secondary | ICD-10-CM

## 2016-12-04 MED ORDER — ONETOUCH DELICA LANCETS 33G MISC: 3 refills | Status: DC

## 2016-12-04 MED ORDER — PREDNISONE 20 MG PO TABS: 40.0000 mg | ORAL_TABLET | Freq: Every day | ORAL | 0 refills | Status: DC

## 2016-12-04 MED ORDER — GLUCOSE BLOOD VI STRP: ORAL_STRIP | 2 refills | Status: DC

## 2016-12-04 NOTE — Progress Notes (Signed)
Subjective:    Patient ID: Todd [name], male    DOB: 28-Nov-1940, 76 y.o.   MRN: [id number]  HPI He is here for an acute visit.   Cough:  He has had a dry cough for the past 2 months.  It started after he had the flu.  It did get better when he got over the flu, but has been coughing daily since.  It is dry, but he feels like there is phlegm in his chest - he just can't get it up.  He hears wheezing at night when he sleeps.  He denies SOB< or other cold symptoms.  He otherwise feels well.  Dr Linna Darner told him at one point he had asthma.  Inhalers have not worked for him in the past.  He has allergies and is taking zyrtec.    He has diabetes and his sugars at home have been well controlled.     Medications and allergies reviewed with patient and updated if appropriate.  Patient Active Problem List   Diagnosis Date Noted  . 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 Hemorrhoids, internal, with bleeding 01/15/2015  . Bursitis of left shoulder 01/14/2015  . Type 2 diabetes, controlled, with peripheral circulatory disorder ([place]) 12/05/2013  . PVD (peripheral vascular disease) ([place]) 11/12/2013  . Atrial fibrillation with RVR ([place]) 11/03/2013  . Carotid artery disease ([place]) 12/20/2009  . OVERWEIGHT/OBESITY 11/17/2008  . Hyperlipidemia 07/20/2008  . Essential hypertension 07/20/2008  . Coronary atherosclerosis 07/20/2008  . ALLERGIC RHINITIS 07/20/2008  . NEPHROLITHIASIS, HX OF 07/20/2008    Current Outpatient Prescriptions on File Prior to Visit  Medication Sig Dispense Refill  . cetirizine (ZYRTEC) 10 MG tablet Take 10 mg by mouth daily as needed for allergies.     . Cholecalciferol (VITAMIN D-3) 5000 UNITS TABS Take 5,000 Units by mouth daily.     Marland Kitchen glimepiride (AMARYL) 2 MG tablet TAKE 1 TABLET (2 MG TOTAL) BY MOUTH DAILY BEFORE BREAKFAST. 90 tablet 1  . GLUCOSAMINE CHONDROITIN COMPLX PO Take 1 tablet by mouth 2 (two) times daily. Pt takes at night    . glucose 4 GM chewable tablet Chew  1 tablet by mouth as needed for low blood sugar.    . metFORMIN (GLUCOPHAGE-XR) 500 MG 24 hr tablet Take 2 tablets (1,000 mg total) by mouth 2 (two) times daily. 360 tablet 3  . metoprolol (LOPRESSOR) 50 MG tablet Take 1 tablet (50 mg total) by mouth 2 (two) times daily. 180 tablet 3  . nitroGLYCERIN (NITROSTAT) 0.4 MG SL tablet Place 1 tablet (0.4 mg total) under the tongue every 5 (five) minutes as needed for chest pain. 100 tablet 3  . ONE TOUCH ULTRA TEST test strip TEST BLOOD SUGAR ONCE DAILY 100 each 2  . ONETOUCH DELICA LANCETS 99991111 MISC TEST BLOOD SUGAR ONCE DAILY 100 each 1  . polycarbophil (FIBERCON) 625 MG tablet Take 625 mg by mouth 2 (two) times daily.     . simvastatin (ZOCOR) 40 MG tablet Take 1 tablet (40 mg total) by mouth every evening. 90 tablet 3  . XARELTO 20 MG TABS tablet Take 1 tablet by mouth daily with supper. 90 tablet 1   No current facility-administered medications on file prior to visit.     Past Medical History:  Diagnosis Date  . Allergic rhinitis   . Asthma   . Atrial fibrillation with rapid ventricular response ([place])    a. newly diagnosed 11/03/13, spont conv to NSR, placed on xarelto.  Marland Kitchen  CAD (coronary artery disease)    a. 1999; s/p CABG: LIMA to LAD, SVG to OM1, SVG to OM2, SVG to RCA  b. Normal nuc 10/2013 (done because of new onset AF.)  . Diabetes mellitus ([place])   . HLD (hyperlipidemia)   . HTN (hypertension)   . Nephrolithiasis   . Overweight(278.02)   . PVD (peripheral vascular disease) ([facility])    Carotid stenosis  . Stroke [facility])     Past Surgical History:  Procedure Laterality Date  . CAROTID ENDARTERECTOMY  1999  . COLONOSCOPY  2014   negative;Dr Sharlett Iles  . COLONOSCOPY W/ POLYPECTOMY  2009   Dr Sharlett Iles  . CORONARY ARTERY BYPASS GRAFT  Aug.1999  . HEMORRHOID BANDING    . NASAL SINUS SURGERY      Social History   Social History  . Marital status: Married    Spouse name: [name]  . Number of children: 2  . Years of education: N/A    Occupational History  . Sales Manager-retired    Social History Main Topics  . Smoking status: Never Smoker  . Smokeless tobacco: Never Used  . Alcohol use No  . Drug use: No  . Sexual activity: Not on file   Other Topics Concern  . Not on file   Social History Narrative  . No narrative on file    Family History  Problem Relation Age of Onset  . Prostate cancer Brother   . Hemochromatosis Brother   . Colon cancer Brother 20  . Heart attack Brother 19  . Asthma Sister   . Heart attack Mother 58  . Heart attack Brother 57  . Diabetes Father     IDDM  . Stroke Father 46    Review of Systems  Constitutional: Negative for appetite change, chills and fever.  HENT: Positive for voice change. Negative for congestion, ear pain, sinus pain and sore throat.   Respiratory: Positive for cough and wheezing. Negative for shortness of breath.   Gastrointestinal: Negative for nausea.       No gerd.   Neurological: Positive for light-headedness (with standing quickly).       Objective:   Vitals:   12/04/16 1138  BP: 138/80  Pulse: 83  Resp: 16  Temp: 98.3 F (36.8 C)   Filed Weights   12/04/16 1138  Weight: 202 lb (91.6 kg)   Body mass index is 30.71 kg/m.  Wt Readings from Last 3 Encounters:  12/04/16 202 lb (91.6 kg)  10/25/16 204 lb (92.5 kg)  10/20/16 205 lb (93 kg)     Physical Exam GENERAL APPEARANCE: Appears stated age, well appearing, NAD EYES: conjunctiva clear, no icterus HEENT: bilateral tympanic membranes and ear canals normal, oropharynx with no erythema, no thyromegaly, trachea midline, no cervical or supraclavicular lymphadenopathy LUNGS: Clear to auscultation without wheeze or crackles, unlabored breathing, good air entry bilaterally HEART: Normal S1,S2 without murmurs EXTREMITIES: Without clubbing, cyanosis, or edema        Assessment & Plan:   See Problem List for Assessment and Plan of chronic medical problems.

## 2016-12-04 NOTE — Assessment & Plan Note (Signed)
Dry cough, daily, persistent for two months since having the flu associated with wheeze, no SOB or other symptoms CXR today - last cxr showed fibrotic changes that are chronic - this was not mentioned in prior CXR Possible reactive airway disease Discussed inhaler - he has tried this in the past - it was not effective Will do oral steroids - discussed his sugars will go up - will increase glimepiride temporarily while on steroids / until sugars return to his baseline He will with concerns/no improvement

## 2016-12-04 NOTE — Patient Instructions (Signed)
Have a chest xray today.   Start prednisone daily for 5 days.     Call if no improvement

## 2016-12-04 NOTE — Progress Notes (Signed)
Pre visit review using our clinic review tool, if applicable. No additional management support is needed unless otherwise documented below in the visit note. 

## 2016-12-05 ENCOUNTER — Encounter: Payer: Self-pay | Admitting: Internal Medicine

## 2016-12-05 NOTE — Progress Notes (Signed)
Result Abstracted 

## 2016-12-06 ENCOUNTER — Telehealth: Payer: Self-pay | Admitting: Internal Medicine

## 2016-12-06 DIAGNOSIS — R05 Cough: Secondary | ICD-10-CM

## 2016-12-06 DIAGNOSIS — R059 Cough, unspecified: Secondary | ICD-10-CM

## 2016-12-06 DIAGNOSIS — R9389 Abnormal findings on diagnostic imaging of other specified body structures: Secondary | ICD-10-CM

## 2016-12-06 NOTE — Telephone Encounter (Signed)
Please call pt back with results from xray from Monday

## 2016-12-07 NOTE — Telephone Encounter (Signed)
Spoke with pts wife to inform her on x-ray results. Pt is on day 3 of prednisone and still coughing. She would like to have the referral placed to pulmonary. Once pt finishes the prednisone can he stop the extra Glimepiride?

## 2016-12-07 NOTE — Telephone Encounter (Signed)
Ref for pulm ordered.  Ok to stop extra glimepiride once sugars return to normal - he may need it for a few days after stopping the prednisone

## 2016-12-08 NOTE — Telephone Encounter (Signed)
LVM informing pt of MDs response. Informed pt it may be 2-3 days after that he will need to take the glimepiride once finishing the prednisone.

## 2016-12-18 ENCOUNTER — Encounter: Payer: Self-pay | Admitting: Pulmonary Disease

## 2016-12-18 ENCOUNTER — Ambulatory Visit (INDEPENDENT_AMBULATORY_CARE_PROVIDER_SITE_OTHER): Payer: PPO | Admitting: Pulmonary Disease

## 2016-12-18 VITALS — BP 124/80 | HR 84 | Ht 68.0 in | Wt 204.2 lb

## 2016-12-18 DIAGNOSIS — R0602 Shortness of breath: Secondary | ICD-10-CM

## 2016-12-18 DIAGNOSIS — J8410 Pulmonary fibrosis, unspecified: Secondary | ICD-10-CM | POA: Diagnosis not present

## 2016-12-18 LAB — NITRIC OXIDE: Nitric Oxide: 12

## 2016-12-18 MED ORDER — CHLORPHENIRAMINE MALEATE 4 MG PO TABS: 8.0000 mg | ORAL_TABLET | Freq: Three times a day (TID) | ORAL | 3 refills | Status: DC

## 2016-12-18 MED ORDER — FLUTICASONE PROPIONATE 50 MCG/ACT NA SUSP: 2.0000 | Freq: Every day | NASAL | 2 refills | Status: DC

## 2016-12-18 NOTE — Progress Notes (Signed)
Todd [name]    [id number]    1940/12/31  Primary Care Physician:Todd Lorretta Harp, MD  Referring Physician: Binnie Rail, MD [place], [place] [address]  Chief complaint:  Consult for evaluation of abnormal chest x-ray, cough  HPI: Todd [name] is a 76 year old with history of allergies, rhinitis, atrial fibrillation, hypertension, hyperlipidemia, diabetes. He had a confirmed episode of influenza in February and was sent for 2 weeks. He has a lingering nonproductive cough associated with occasional wheezing. He denies any dyspnea, sputum production, fevers, chills. He had been given a prednisone taper last month without any improvement in symptoms. He also had a chest x-ray which showed bibasilar fibrosis and he has been referred here for further evaluation  Pets: None Occupation: Retired, used to work in [employer] Exposures: Significant exposure to asbestos in previous line of work Smoking history: None  Outpatient Encounter Prescriptions as of 12/18/2016  Medication Sig  . cetirizine (ZYRTEC) 10 MG tablet Take 10 mg by mouth daily as needed for allergies.   . Cholecalciferol (VITAMIN D-3) 5000 UNITS TABS Take 5,000 Units by mouth daily.   Marland Kitchen glimepiride (AMARYL) 2 MG tablet TAKE 1 TABLET (2 MG TOTAL) BY MOUTH DAILY BEFORE BREAKFAST.  Marland Kitchen GLUCOSAMINE CHONDROITIN COMPLX PO Take 1 tablet by mouth 2 (two) times daily. Pt takes at night  . glucose 4 GM chewable tablet Chew 1 tablet by mouth as needed for low blood sugar.  Marland Kitchen glucose blood (ONE TOUCH ULTRA TEST) test strip TEST BLOOD SUGAR ONCE DAILY  . metFORMIN (GLUCOPHAGE-XR) 500 MG 24 hr tablet Take 2 tablets (1,000 mg total) by mouth 2 (two) times daily.  . metoprolol (LOPRESSOR) 50 MG tablet Take 1 tablet (50 mg total) by mouth 2 (two) times daily.  . nitroGLYCERIN (NITROSTAT) 0.4 MG SL tablet Place 1 tablet (0.4 mg total) under the tongue every 5 (five) minutes as needed for chest pain.  Marland Kitchen omeprazole (PRILOSEC) 20 MG capsule  Take 20 mg by mouth daily.  Todd [name] DELICA LANCETS 45G MISC TEST BLOOD SUGAR ONCE DAILY  . polycarbophil (FIBERCON) 625 MG tablet Take 625 mg by mouth 2 (two) times daily.   . simvastatin (ZOCOR) 40 MG tablet Take 1 tablet (40 mg total) by mouth every evening.  Todd [name] 20 MG TABS tablet Take 1 tablet by mouth daily with supper.  . [DISCONTINUED] predniSONE (DELTASONE) 20 MG tablet Take 2 tablets (40 mg total) by mouth daily with breakfast.   No facility-administered encounter medications on file as of 12/18/2016.     Allergies as of 12/18/2016 - Review Complete 12/18/2016  Allergen Reaction Noted  . Amlodipine besylate    . Ivp dye [iodinated diagnostic agents]      Past Medical History:  Diagnosis Date  . Allergic rhinitis   . Asthma   . Atrial fibrillation with rapid ventricular response (Todd [name])    a. newly diagnosed 11/03/13, spont conv to NSR, placed on xarelto.  Marland Kitchen CAD (coronary artery disease)    a. 1999; s/p CABG: LIMA to LAD, SVG to OM1, SVG to OM2, SVG to RCA  b. Normal nuc 10/2013 (done because of new onset AF.)  . Diabetes mellitus ([place])   . HLD (hyperlipidemia)   . HTN (hypertension)   . Nephrolithiasis   . Overweight(278.02)   . PVD (peripheral vascular disease) (H[facility])    Carotid stenosis  . Stroke [facility])     Past Surgical History:  Procedure Laterality Date  .  CAROTID ENDARTERECTOMY  1999  . COLONOSCOPY  2014   negative;Dr Sharlett Iles  . COLONOSCOPY W/ POLYPECTOMY  2009   Dr Sharlett Iles  . CORONARY ARTERY BYPASS GRAFT  Aug.1999  . HEMORRHOID BANDING    . NASAL SINUS SURGERY      Family History  Problem Relation Age of Onset  . Prostate cancer Brother   . Hemochromatosis Brother   . Colon cancer Brother 64  . Heart attack Brother 62  . Asthma Sister   . Heart attack Mother 71  . Heart attack Brother 63  . Diabetes Father     IDDM  . Stroke Father 14    Social History   Social History  . Marital status: Married    Spouse name: Todd [name]  . Number of  children: 2  . Years of education: N/A   Occupational History  . Sales Manager-retired    Social History Main Topics  . Smoking status: Never Smoker  . Smokeless tobacco: Never Used  . Alcohol use No  . Drug use: No  . Sexual activity: Not on file   Other Topics Concern  . Not on file   Social History Narrative  . No narrative on file    Review of systems: Review of Systems  Constitutional: Negative for fever and chills.  HENT: Negative.   Eyes: Negative for blurred vision.  Respiratory: as per HPI  Cardiovascular: Negative for chest pain and palpitations.  Gastrointestinal: Negative for vomiting, diarrhea, blood per rectum. Genitourinary: Negative for dysuria, urgency, frequency and hematuria.  Musculoskeletal: Negative for myalgias, back pain and joint pain.  Skin: Negative for itching and rash.  Neurological: Negative for dizziness, tremors, focal weakness, seizures and loss of consciousness.  Endo/Heme/Allergies: Negative for environmental allergies.  Psychiatric/Behavioral: Negative for depression, suicidal ideas and hallucinations.  All other systems reviewed and are negative.  Physical Exam: Blood pressure 124/80, pulse 84, height 5\' 8"  (1.727 m), weight 204 lb 3.2 oz (92.6 kg), SpO2 94 %. Gen:      No acute distress HEENT:  EOMI, sclera anicteric Neck:     No masses; no thyromegaly Lungs:   Mild bibasilar crackles, no wheeze; normal respiratory effort CV:         Regular rate and rhythm; no murmurs Abd:      + bowel sounds; soft, non-tender; no palpable masses, no distension Ext:    No edema; adequate peripheral perfusion Skin:      Warm and dry; no rash Neuro: alert and oriented x 3 Psych: normal mood and affect  Data Reviewed: Chest x-ray 12/04/16-stable interstitial opacities CT abdomen 11/20/13-mild reticular opacities at the bases I have reviewed all the images personally  FENO 12/18/16- 12  Assessment:  #1 Chronic cough Cough is likely  postinflammatory after recent episode of flu infection, exacerbated by postnasal drip and upper airway cough syndrome. Although he has a history of asthma, suspicion for this is low as the clinical picture does not fit,  FENO is low and he has not responded to prednisone therapy.  I'll treat postnasal drip with chlorpheniramine 8 mg 3 times daily and Flonase nasal spray.I educated him on behavioral changes to deal with cough including conscious suppression of the urge to cough, use of throat lozenges.  #2 Abnormal chest x-ray He has some mild interstitial opacities, possible fibrosis. His CT abdomen dating back to 2015 has reticular opacities at the bases. Given his asbestos exposure he may be developing lung fibrosis. We evaluate this by getting a high-resolution  CT of the chest. He also get a PFT before next visit.  Plan/Recommendations: - Chlorpheniramine 8 mg tid, flonase nasal spray - High res CT, PFTs  Marshell Garfinkel MD [facility] Pager [phone] 12/18/2016, 2:57 PM  CC: Todd Rail, MD

## 2016-12-18 NOTE — Patient Instructions (Signed)
Will start you on chlorpheniramine 8 mg 3 times daily and Flonase nasal spray We'll schedule you for high-resolution CT of the chest PFTs in 3 months with follow-up in clinic after PFTs

## 2017-01-02 ENCOUNTER — Ambulatory Visit (INDEPENDENT_AMBULATORY_CARE_PROVIDER_SITE_OTHER)
Admission: RE | Admit: 2017-01-02 | Discharge: 2017-01-02 | Disposition: A | Discharge status: Home / Self Care | Payer: PPO | Source: Ambulatory Visit | Attending: Pulmonary Disease | Admitting: Pulmonary Disease

## 2017-01-02 ENCOUNTER — Other Ambulatory Visit: Payer: PPO

## 2017-01-02 DIAGNOSIS — R062 Wheezing: Secondary | ICD-10-CM | POA: Diagnosis not present

## 2017-01-02 DIAGNOSIS — J8410 Pulmonary fibrosis, unspecified: Secondary | ICD-10-CM

## 2017-01-02 DIAGNOSIS — R05 Cough: Secondary | ICD-10-CM | POA: Diagnosis not present

## 2017-01-04 ENCOUNTER — Telehealth: Payer: Self-pay | Admitting: Pulmonary Disease

## 2017-01-04 DIAGNOSIS — J849 Interstitial pulmonary disease, unspecified: Secondary | ICD-10-CM

## 2017-01-04 NOTE — Telephone Encounter (Signed)
lmtcb x2 for pt's spouse.  

## 2017-01-04 NOTE — Telephone Encounter (Signed)
LMTCB

## 2017-01-04 NOTE — Telephone Encounter (Signed)
Patient wife called back - she can be reached at [phone] -pr

## 2017-01-04 NOTE — Telephone Encounter (Signed)
I called the home number but got the answering service. I left a message requesting a call back.  The CT scan shows lung scarring, fibrosis that is probably the result of asbestos exposure. Please order ILD panel serologies to be done to make sure he does not have other reasons for lung fibrosis. I will discuss results further at time of clinic visit.   PM

## 2017-01-04 NOTE — Telephone Encounter (Signed)
lmtcb x1 for pt's spouse.  

## 2017-01-04 NOTE — Telephone Encounter (Signed)
Spoke wit pt's wife and advised that Dr Vaughan Browner has not read the results yet but we will call them as soon as he does.  She verbalized understanding. Dr Vaughan Browner, please advise on CT results.

## 2017-01-08 NOTE — Telephone Encounter (Signed)
[phone] Spouse calling back

## 2017-01-08 NOTE — Telephone Encounter (Signed)
Spoke with Mrs. Mineo-aware of CT results and will have patient come by tomorrow to have labs drawn. Pt will keep appt for 03/20/17 for PFT and OV with PM to discuss further in detail.

## 2017-01-08 NOTE — Telephone Encounter (Signed)
lmomtcb x 2  

## 2017-01-09 ENCOUNTER — Other Ambulatory Visit (INDEPENDENT_AMBULATORY_CARE_PROVIDER_SITE_OTHER): Payer: PPO

## 2017-01-09 DIAGNOSIS — J849 Interstitial pulmonary disease, unspecified: Secondary | ICD-10-CM | POA: Diagnosis not present

## 2017-01-09 LAB — SEDIMENTATION RATE: Sed Rate: 42 mm/hr — ABNORMAL HIGH (ref 0–20)

## 2017-01-09 LAB — C-REACTIVE PROTEIN: CRP: 0.2 mg/dL — ABNORMAL LOW (ref 0.5–20.0)

## 2017-01-10 LAB — ANA, IFA COMPREHENSIVE PANEL
Anti Nuclear Antibody(ANA): NEGATIVE
ENA SM Ab Ser-aCnc: <1
SM/RNP: <1
SSA (Ro) (ENA) Antibody, IgG: <1
SSB (La) (ENA) Antibody, IgG: <1
Scleroderma (Scl-70) (ENA) Antibody, IgG: <1
ds DNA Ab: 1 IU/mL

## 2017-01-10 LAB — RHEUMATOID FACTOR: Rhuematoid fact SerPl-aCnc: <14 IU/mL (ref <14)

## 2017-01-10 LAB — SJOGRENS SYNDROME-A EXTRACTABLE NUCLEAR ANTIBODY: SSA (Ro) (ENA) Antibody, IgG: <1

## 2017-01-10 LAB — ANTI-DNA ANTIBODY, DOUBLE-STRANDED: ds DNA Ab: 1 IU/mL

## 2017-01-10 LAB — RNP ANTIBODY: Ribonucleic Protein(ENA) Antibody, IgG: <1

## 2017-01-10 LAB — ANTI-SCLERODERMA ANTIBODY: Scleroderma (Scl-70) (ENA) Antibody, IgG: <1

## 2017-01-10 LAB — JO-1 ANTIBODY-IGG: Jo-1 Antibody, IgG: <1

## 2017-01-10 LAB — CK TOTAL AND CKMB (NOT AT ARMC)
CK, MB: 1.1 ng/mL (ref 0.0–5.0)
Relative Index: 2.2 (ref 0.0–4.0)
Total CK: 51 U/L (ref 44–196)

## 2017-01-10 LAB — CENTROMERE ANTIBODIES: Centromere Ab Screen: <1

## 2017-01-10 LAB — ANCA SCREEN W REFLEX TITER: ANCA Screen: NEGATIVE

## 2017-01-10 LAB — CYCLIC CITRUL PEPTIDE ANTIBODY, IGG: Cyclic Citrullin Peptide Ab: <16 Units

## 2017-01-11 LAB — ALDOLASE: Aldolase: 4.1 U/L (ref <=8.1)

## 2017-01-16 LAB — HYPERSENSITIVITY PNUEMONITIS PROFILE

## 2017-01-17 LAB — MYOSITIS PANEL III
EJ*: NEGATIVE
Jo-1 (WB)*: NEGATIVE
Ku*: NEGATIVE
Mi-2 antibodies*: NEGATIVE
OJ*: NEGATIVE
PL-12*: NEGATIVE
PL-7*: NEGATIVE
PM-Scl 100*: NEGATIVE
PM-Scl 75*: POSITIVE — AB
RNP: 2.3 EU/ml
RO-52*: NEGATIVE
Signal Recognition Particle*: NEGATIVE

## 2017-01-17 LAB — ANA W/REFLEX: Anti Nuclear Antibody(ANA): NEGATIVE

## 2017-01-19 ENCOUNTER — Telehealth: Payer: Self-pay | Admitting: Pulmonary Disease

## 2017-01-19 NOTE — Telephone Encounter (Signed)
lmtcb X1 for pt/spouse

## 2017-01-22 NOTE — Telephone Encounter (Signed)
Patient wife returning call - she can be reached at [phone] -pr

## 2017-01-22 NOTE — Telephone Encounter (Signed)
Dr. Vaughan Browner has discussed results with pt's spouse via telephone. Nothing further needed.

## 2017-01-22 NOTE — Telephone Encounter (Signed)
lmtcb X2 for pt.  PM please advise on lab results.  Thanks!

## 2017-01-29 DIAGNOSIS — H2511 Age-related nuclear cataract, right eye: Secondary | ICD-10-CM | POA: Diagnosis not present

## 2017-01-29 DIAGNOSIS — H2513 Age-related nuclear cataract, bilateral: Secondary | ICD-10-CM | POA: Diagnosis not present

## 2017-01-29 DIAGNOSIS — I10 Essential (primary) hypertension: Secondary | ICD-10-CM | POA: Diagnosis not present

## 2017-01-29 DIAGNOSIS — H25013 Cortical age-related cataract, bilateral: Secondary | ICD-10-CM | POA: Diagnosis not present

## 2017-02-13 DIAGNOSIS — H2511 Age-related nuclear cataract, right eye: Secondary | ICD-10-CM | POA: Diagnosis not present

## 2017-02-27 DIAGNOSIS — H2512 Age-related nuclear cataract, left eye: Secondary | ICD-10-CM | POA: Diagnosis not present

## 2017-03-20 ENCOUNTER — Encounter: Payer: Self-pay | Admitting: Pulmonary Disease

## 2017-03-20 ENCOUNTER — Ambulatory Visit (INDEPENDENT_AMBULATORY_CARE_PROVIDER_SITE_OTHER): Payer: PPO | Admitting: Pulmonary Disease

## 2017-03-20 VITALS — BP 124/76 | HR 79 | Ht 68.0 in | Wt 202.0 lb

## 2017-03-20 DIAGNOSIS — R0602 Shortness of breath: Secondary | ICD-10-CM | POA: Diagnosis not present

## 2017-03-20 DIAGNOSIS — J8410 Pulmonary fibrosis, unspecified: Secondary | ICD-10-CM

## 2017-03-20 LAB — PULMONARY FUNCTION TEST
DL/VA % pred: 89 %
DL/VA: 3.95 ml/min/mmHg/L
DLCO cor % pred: 55 %
DLCO cor: 15.85 ml/min/mmHg
DLCO unc % pred: 56 %
DLCO unc: 15.89 ml/min/mmHg
FEF 25-75 Post: 2.94 L/sec
FEF 25-75 Pre: 2.59 L/sec
FEF2575-%Change-Post: 13 %
FEF2575-%Pred-Post: 147 %
FEF2575-%Pred-Pre: 130 %
FEV1-%Change-Post: 2 %
FEV1-%Pred-Post: 87 %
FEV1-%Pred-Pre: 85 %
FEV1-Post: 2.38 L
FEV1-Pre: 2.33 L
FEV1FVC-%Change-Post: 5 %
FEV1FVC-%Pred-Pre: 112 %
FEV6-%Change-Post: -3 %
FEV6-%Pred-Post: 77 %
FEV6-%Pred-Pre: 79 %
FEV6-Post: 2.73 L
FEV6-Pre: 2.82 L
FEV6FVC-%Change-Post: 0 %
FEV6FVC-%Pred-Post: 106 %
FEV6FVC-%Pred-Pre: 105 %
FVC-%Change-Post: -3 %
FVC-%Pred-Post: 72 %
FVC-%Pred-Pre: 75 %
FVC-Post: 2.74 L
FVC-Pre: 2.84 L
Post FEV1/FVC ratio: 87 %
Post FEV6/FVC ratio: 100 %
Pre FEV1/FVC ratio: 82 %
Pre FEV6/FVC Ratio: 99 %
RV % pred: 51 %
RV: 1.23 L
TLC % pred: 62 %
TLC: 4.02 L

## 2017-03-20 NOTE — Progress Notes (Signed)
Todd [name]    [id number]    [birth year]  Primary Care Physician:Burns, [name], MD  Referring Physician: Binnie Rail, MD [place],  [address]  Chief complaint:   Follow up for cough Pulmonary fibrosis, Asbestosis  HPI: Todd [name] is a 76 year old with history of allergies, rhinitis, atrial fibrillation, hypertension, hyperlipidemia, diabetes. He had a confirmed episode of influenza in February and was sent for 2 weeks. He has a lingering nonproductive cough associated with occasional wheezing. He denies any dyspnea, sputum production, fevers, chills. He had been given a prednisone taper last month without any improvement in symptoms. He also had a chest x-ray which showed bibasilar fibrosis and he has been referred here for further evaluation  Pets: None Occupation: Retired, used to work in [employer] Exposures: Significant exposure to asbestos in previous line of work Smoking history: None  Outpatient Encounter Prescriptions as of 03/20/2017  Medication Sig  . cetirizine (ZYRTEC) 10 MG tablet Take 10 mg by mouth daily as needed for allergies.   . Cholecalciferol (VITAMIN D-3) 5000 UNITS TABS Take 5,000 Units by mouth daily.   Marland Kitchen glimepiride (AMARYL) 2 MG tablet TAKE 1 TABLET (2 MG TOTAL) BY MOUTH DAILY BEFORE BREAKFAST.  Marland Kitchen glucose 4 GM chewable tablet Chew 1 tablet by mouth as needed for low blood sugar.  Marland Kitchen glucose blood (ONE TOUCH ULTRA TEST) test strip TEST BLOOD SUGAR ONCE DAILY  . metFORMIN (GLUCOPHAGE-XR) 500 MG 24 hr tablet Take 2 tablets (1,000 mg total) by mouth 2 (two) times daily.  . metoprolol (LOPRESSOR) 50 MG tablet Take 1 tablet (50 mg total) by mouth 2 (two) times daily.  . nitroGLYCERIN (NITROSTAT) 0.4 MG SL tablet Place 1 tablet (0.4 mg total) under the tongue every 5 (five) minutes as needed for chest pain.  Marland Kitchen omeprazole (PRILOSEC) 20 MG capsule Take 20 mg by mouth daily.  Todd [name] DELICA LANCETS 54Y MISC TEST BLOOD SUGAR ONCE DAILY  .  polycarbophil (FIBERCON) 625 MG tablet Take 625 mg by mouth 2 (two) times daily.   . simvastatin (ZOCOR) 40 MG tablet Take 1 tablet (40 mg total) by mouth every evening.  Todd [name] 20 MG TABS tablet Take 1 tablet by mouth daily with supper.  . [DISCONTINUED] fluticasone (FLONASE) 50 MCG/ACT nasal spray Place 2 sprays into both nostrils daily.  . [DISCONTINUED] GLUCOSAMINE CHONDROITIN COMPLX PO Take 1 tablet by mouth 2 (two) times daily. Pt takes at night  . [DISCONTINUED] chlorpheniramine (CHLOR-TRIMETON) 4 MG tablet Take 2 tablets (8 mg total) by mouth 3 (three) times daily. (Patient not taking: Reported on 03/20/2017)   No facility-administered encounter medications on file as of 03/20/2017.     Allergies as of 03/20/2017 - Review Complete 03/20/2017  Allergen Reaction Noted  . Amlodipine besylate    . Ivp dye [iodinated diagnostic agents]      Past Medical History:  Diagnosis Date  . Allergic rhinitis   . Asthma   . Atrial fibrillation with rapid ventricular response ([place])    a. newly diagnosed 11/03/13, spont conv to NSR, placed on xarelto.  Marland Kitchen CAD (coronary artery disease)    a. 1999; s/p CABG: LIMA to LAD, SVG to OM1, SVG to OM2, SVG to RCA  b. Normal nuc 10/2013 (done because of new onset AF.)  . Diabetes mellitus ([place])   . HLD (hyperlipidemia)   . HTN (hypertension)   . Nephrolithiasis   . Overweight(278.02)   . PVD (  peripheral vascular disease) ([place])    Carotid stenosis  . Stroke [facility])     Past Surgical History:  Procedure Laterality Date  . CAROTID ENDARTERECTOMY  1999  . COLONOSCOPY  2014   negative;Dr Sharlett Iles  . COLONOSCOPY W/ POLYPECTOMY  2009   Dr Sharlett Iles  . CORONARY ARTERY BYPASS GRAFT  Aug.1999  . HEMORRHOID BANDING    . NASAL SINUS SURGERY      Family History  Problem Relation Age of Onset  . Prostate cancer Brother   . Hemochromatosis Brother   . Colon cancer Brother 42  . Heart attack Brother 70  . Asthma Sister   . Heart attack Mother 20  .  Heart attack Brother 48  . Diabetes Father        IDDM  . Stroke Father 75    Social History   Social History  . Marital status: Married    Spouse name: [name]  . Number of children: 2  . Years of education: N/A   Occupational History  . Sales Manager-retired    Social History Main Topics  . Smoking status: Never Smoker  . Smokeless tobacco: Never Used  . Alcohol use No  . Drug use: No  . Sexual activity: Not on file   Other Topics Concern  . Not on file   Social History Narrative  . No narrative on file    Review of systems: Review of Systems  Constitutional: Negative for fever and chills.  HENT: Negative.   Eyes: Negative for blurred vision.  Respiratory: as per HPI  Cardiovascular: Negative for chest pain and palpitations.  Gastrointestinal: Negative for vomiting, diarrhea, blood per rectum. Genitourinary: Negative for dysuria, urgency, frequency and hematuria.  Musculoskeletal: Negative for myalgias, back pain and joint pain.  Skin: Negative for itching and rash.  Neurological: Negative for dizziness, tremors, focal weakness, seizures and loss of consciousness.  Endo/Heme/Allergies: Negative for environmental allergies.  Psychiatric/Behavioral: Negative for depression, suicidal ideas and hallucinations.  All other systems reviewed and are negative.  Physical Exam: Blood pressure 124/80, pulse 84, height 5\' 8"  (1.727 m), weight 204 lb 3.2 oz (92.6 kg), SpO2 94 %. Gen:      No acute distress HEENT:  EOMI, sclera anicteric Neck:     No masses; no thyromegaly Lungs:   Mild bibasilar crackles, no wheeze; normal respiratory effort CV:         Regular rate and rhythm; no murmurs Abd:      + bowel sounds; soft, non-tender; no palpable masses, no distension Ext:    No edema; adequate peripheral perfusion Skin:      Warm and dry; no rash Neuro: alert and oriented x 3 Psych: normal mood and affect  Data Reviewed: Chest x-ray 12/04/16-stable interstitial  opacities CT abdomen 11/20/13-mild reticular opacities at the bases CT high-resolution 01/02/17- subpleural reticulation, mild traction bronchiectasis, possible early honeycombing with basal gradient. I have reviewed all the images personally  FENO 12/18/16- 12  PFTs 03/20/17 FVC 2.74 (72%] FEV1 2.38 (87%) F/F 87 TLC 62% DLCO 56% Moderate restriction with severe diffusion defect.  ILD serologies 01/09/17- all negative except for mild elevation in SCL 75.  Assessment:  #1 Chronic cough Cough is likely postinflammatory after recent episode of flu infection, exacerbated by postnasal drip and upper airway cough syndrome. Although he has a history of asthma, suspicion for this is low as the clinical picture does not fit,  FENO is low and he has not responded to prednisone therapy.  Symptoms have  improved with chrlorpheniramine and flonase. He will continue to use these PRN.  #2 Pulmonary fibrosis, asbestos exposure. I reviewed his CT scan which shows pulmonary fibrosis in likely UIP pattern. Given his significant asbestos exposure this likely represent asbestosis. However there is a possibility that this could be idiopathic pulmonary fibrosis. We'll keep a close watch on him with a repeat high-resolution CT scan and pulmonary function tests. If there is progression of disease then we will discuss starting antifibrotic therapy.  Plan/Recommendations: - Follow up High res CT, PFTs  Marshell Garfinkel MD  Pulmonary and Critical Care Pager [phone] 03/20/2017, 10:13 AM  CC: Binnie Rail, MD

## 2017-03-20 NOTE — Progress Notes (Signed)
PFT done today. 

## 2017-03-20 NOTE — Patient Instructions (Signed)
We will schedule you for a follow-up CT high resolution and pulmonary function test around May 2019 Return to clinic after tests for further discussion Please call us sooner if there is any change in his symptoms

## 2017-03-22 DIAGNOSIS — S63654A Sprain of metacarpophalangeal joint of right ring finger, initial encounter: Secondary | ICD-10-CM | POA: Diagnosis not present

## 2017-03-22 DIAGNOSIS — S63656A Sprain of metacarpophalangeal joint of right little finger, initial encounter: Secondary | ICD-10-CM | POA: Diagnosis not present

## 2017-04-27 ENCOUNTER — Ambulatory Visit: Payer: PPO | Admitting: Internal Medicine

## 2017-04-30 NOTE — Progress Notes (Signed)
Subjective:    Patient ID: Todd [name], male    DOB: 28-Jul-1941, 76 y.o.   MRN: [id number]  HPI The patient is here for follow up.  CAD, Hypertension: He is taking his medication daily. He is compliant with a low sodium diet.  He denies chest pain, palpitations, shortness of breath and regular headaches. He is not exercising regularly.      Hyperlipidemia: He is taking his medication daily. He is compliant with a low fat/cholesterol diet. He is not exercising regularly. He denies myalgias.   Diabetes: He is taking his medication daily as prescribed. He is fairly compliant with a diabetic diet. He is exercising regularly. He monitors his sugars and they have been running 161, 148, 86, low 100's. He checks his feet daily and denies foot lesions. He is up-to-date with an ophthalmology examination.    Medications and allergies reviewed with patient and updated if appropriate.  Patient Active Problem List   Diagnosis Date Noted  . Cough 12/04/2016  . 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 Hemorrhoids, internal, with bleeding 01/15/2015  . Bursitis of left shoulder 01/14/2015  . Type 2 diabetes, controlled, with peripheral circulatory disorder ([place]) 12/05/2013  . PVD (peripheral vascular disease) ([place]) 11/12/2013  . Atrial fibrillation with RVR ([place]) 11/03/2013  . Carotid artery disease ([place]) 12/20/2009  . OVERWEIGHT/OBESITY 11/17/2008  . Hyperlipidemia 07/20/2008  . Essential hypertension 07/20/2008  . Coronary atherosclerosis 07/20/2008  . ALLERGIC RHINITIS 07/20/2008  . NEPHROLITHIASIS, HX OF 07/20/2008    Current Outpatient Prescriptions on File Prior to Visit  Medication Sig Dispense Refill  . cetirizine (ZYRTEC) 10 MG tablet Take 10 mg by mouth daily as needed for allergies.     . Cholecalciferol (VITAMIN D-3) 5000 UNITS TABS Take 5,000 Units by mouth daily.     Marland Kitchen glimepiride (AMARYL) 2 MG tablet TAKE 1 TABLET (2 MG TOTAL) BY MOUTH DAILY BEFORE BREAKFAST. 90 tablet 1  .  glucose 4 GM chewable tablet Chew 1 tablet by mouth as needed for low blood sugar.    Marland Kitchen glucose blood (ONE TOUCH ULTRA TEST) test strip TEST BLOOD SUGAR ONCE DAILY 100 each 2  . metFORMIN (GLUCOPHAGE-XR) 500 MG 24 hr tablet Take 2 tablets (1,000 mg total) by mouth 2 (two) times daily. 360 tablet 3  . metoprolol (LOPRESSOR) 50 MG tablet Take 1 tablet (50 mg total) by mouth 2 (two) times daily. 180 tablet 3  . nitroGLYCERIN (NITROSTAT) 0.4 MG SL tablet Place 1 tablet (0.4 mg total) under the tongue every 5 (five) minutes as needed for chest pain. 100 tablet 3  . omeprazole (PRILOSEC) 20 MG capsule Take 20 mg by mouth daily.    [name] DELICA LANCETS 99991111 MISC TEST BLOOD SUGAR ONCE DAILY 100 each 3  . polycarbophil (FIBERCON) 625 MG tablet Take 625 mg by mouth 2 (two) times daily.     . simvastatin (ZOCOR) 40 MG tablet Take 1 tablet (40 mg total) by mouth every evening. 90 tablet 3  . XARELTO 20 MG TABS tablet Take 1 tablet by mouth daily with supper. 90 tablet 1   No current facility-administered medications on file prior to visit.     Past Medical History:  Diagnosis Date  . Allergic rhinitis   . Asthma   . Atrial fibrillation with rapid ventricular response ([place])    a. newly diagnosed 11/03/13, spont conv to NSR, placed on xarelto.  Marland Kitchen CAD (coronary artery disease)    a. 1999; s/p CABG: LIMA  to LAD, SVG to OM1, SVG to OM2, SVG to RCA  b. Normal nuc 10/2013 (done because of new onset AF.)  . Diabetes mellitus ([place])   . HLD (hyperlipidemia)   . HTN (hypertension)   . Nephrolithiasis   . Overweight(278.02)   . PVD (peripheral vascular disease) ([facility])    Carotid stenosis  . Stroke [facility])     Past Surgical History:  Procedure Laterality Date  . CAROTID ENDARTERECTOMY  1999  . COLONOSCOPY  2014   negative;Dr Sharlett Iles  . COLONOSCOPY W/ POLYPECTOMY  2009   Dr Sharlett Iles  . CORONARY ARTERY BYPASS GRAFT  Aug.1999  . HEMORRHOID BANDING    . INTRACAPSULAR CATARACT EXTRACTION Bilateral 2018    . NASAL SINUS SURGERY      Social History   Social History  . Marital status: Married    Spouse name: [name]  . Number of children: 2  . Years of education: N/A   Occupational History  . Sales Manager-retired    Social History Main Topics  . Smoking status: Never Smoker  . Smokeless tobacco: Never Used  . Alcohol use No  . Drug use: No  . Sexual activity: Not Asked   Other Topics Concern  . None   Social History Narrative  . None    Family History  Problem Relation Age of Onset  . Prostate cancer Brother   . Hemochromatosis Brother   . Colon cancer Brother 31  . Heart attack Brother 75  . Asthma Sister   . Heart attack Mother 62  . Heart attack Brother 21  . Diabetes Father        IDDM  . Stroke Father 10    Review of Systems  Constitutional: Negative for chills and fever.  Respiratory: Negative for cough, shortness of breath and wheezing.   Cardiovascular: Positive for leg swelling ([place]). Negative for chest pain and palpitations.  Neurological: Positive for light-headedness (rare). Negative for dizziness and headaches.       Objective:   Vitals:   05/01/17 0827  BP: 132/82  Pulse: 67  Resp: 16  Temp: 98.6 F (37 C)  SpO2: 97%   Wt Readings from Last 3 Encounters:  05/01/17 203 lb (92.1 kg)  03/20/17 202 lb (91.6 kg)  12/18/16 204 lb 3.2 oz (92.6 kg)   Body mass index is 30.87 kg/m.   Physical Exam    Constitutional: Appears well-developed and well-nourished. No distress.  HENT:  Head: Normocephalic and atraumatic.  Neck: Neck supple. No tracheal deviation present. No thyromegaly present.  No cervical lymphadenopathy Cardiovascular: Normal rate, regular rhythm and normal heart sounds.   No murmur heard. No carotid bruit .  Trace b/l le edema Pulmonary/Chest: Effort normal and breath sounds normal. No respiratory distress. No has no wheezes. No rales.  Skin: Skin is warm and dry. Not diaphoretic.  Psychiatric: Normal mood and  affect. Behavior is normal.      Assessment & Plan:    See Problem List for Assessment and Plan of chronic medical problems.

## 2017-04-30 NOTE — Patient Instructions (Addendum)

## 2017-05-01 ENCOUNTER — Other Ambulatory Visit (INDEPENDENT_AMBULATORY_CARE_PROVIDER_SITE_OTHER): Payer: PPO

## 2017-05-01 ENCOUNTER — Ambulatory Visit (INDEPENDENT_AMBULATORY_CARE_PROVIDER_SITE_OTHER): Payer: PPO | Admitting: Internal Medicine

## 2017-05-01 ENCOUNTER — Encounter: Payer: Self-pay | Admitting: Internal Medicine

## 2017-05-01 VITALS — BP 132/82 | HR 67 | Temp 98.6°F | Resp 16 | Ht 68.0 in | Wt 203.0 lb

## 2017-05-01 DIAGNOSIS — E1151 Type 2 diabetes mellitus with diabetic peripheral angiopathy without gangrene: Secondary | ICD-10-CM | POA: Diagnosis not present

## 2017-05-01 DIAGNOSIS — I4891 Unspecified atrial fibrillation: Secondary | ICD-10-CM | POA: Diagnosis not present

## 2017-05-01 DIAGNOSIS — E7800 Pure hypercholesterolemia, unspecified: Secondary | ICD-10-CM | POA: Diagnosis not present

## 2017-05-01 DIAGNOSIS — I10 Essential (primary) hypertension: Secondary | ICD-10-CM

## 2017-05-01 DIAGNOSIS — I2510 Atherosclerotic heart disease of native coronary artery without angina pectoris: Secondary | ICD-10-CM

## 2017-05-01 LAB — CBC WITH DIFFERENTIAL/PLATELET
Basophils Absolute: 0.1 10*3/uL (ref 0.0–0.1)
Basophils Relative: 0.9 % (ref 0.0–3.0)
Eosinophils Absolute: 0.1 10*3/uL (ref 0.0–0.7)
Eosinophils Relative: 1.2 % (ref 0.0–5.0)
HCT: 40.7 % (ref 39.0–52.0)
Hemoglobin: 14 g/dL (ref 13.0–17.0)
Lymphocytes Relative: 45 % (ref 12.0–46.0)
Lymphs Abs: 2.6 10*3/uL (ref 0.7–4.0)
MCHC: 34.4 g/dL (ref 30.0–36.0)
MCV: 99.2 fl (ref 78.0–100.0)
Monocytes Absolute: 0.5 10*3/uL (ref 0.1–1.0)
Monocytes Relative: 9.2 % (ref 3.0–12.0)
Neutro Abs: 2.5 10*3/uL (ref 1.4–7.7)
Neutrophils Relative %: 43.7 % (ref 43.0–77.0)
Platelets: 170 10*3/uL (ref 150.0–400.0)
RBC: 4.1 Mil/uL — ABNORMAL LOW (ref 4.22–5.81)
RDW: 13.6 % (ref 11.5–15.5)
WBC: 5.7 10*3/uL (ref 4.0–10.5)

## 2017-05-01 LAB — COMPREHENSIVE METABOLIC PANEL
ALT: 21 U/L (ref 0–53)
AST: 22 U/L (ref 0–37)
Albumin: 4.1 g/dL (ref 3.5–5.2)
Alkaline Phosphatase: 55 U/L (ref 39–117)
BUN: 17 mg/dL (ref 6–23)
CO2: 26 mEq/L (ref 19–32)
Calcium: 9.5 mg/dL (ref 8.4–10.5)
Chloride: 106 mEq/L (ref 96–112)
Creatinine, Ser: 0.97 mg/dL (ref 0.40–1.50)
GFR: 80.36 mL/min (ref >60.00)
Glucose, Bld: 129 mg/dL — ABNORMAL HIGH (ref 70–99)
Potassium: 4.3 mEq/L (ref 3.5–5.1)
Sodium: 142 mEq/L (ref 135–145)
Total Bilirubin: 0.6 mg/dL (ref 0.2–1.2)
Total Protein: 7 g/dL (ref 6.0–8.3)

## 2017-05-01 LAB — LIPID PANEL
Cholesterol: 134 mg/dL (ref 0–200)
HDL: 33.1 mg/dL — ABNORMAL LOW (ref >39.00)
LDL Cholesterol: 66 mg/dL (ref 0–99)
NonHDL: 101.31
Total CHOL/HDL Ratio: 4
Triglycerides: 179 mg/dL — ABNORMAL HIGH (ref 0.0–149.0)
VLDL: 35.8 mg/dL (ref 0.0–40.0)

## 2017-05-01 LAB — TSH: TSH: 5.03 u[IU]/mL — ABNORMAL HIGH (ref 0.35–4.50)

## 2017-05-01 LAB — HEMOGLOBIN A1C: Hgb A1c MFr Bld: 7.2 % — ABNORMAL HIGH (ref 4.6–6.5)

## 2017-05-01 NOTE — Assessment & Plan Note (Signed)
Check a1c Eye exam up to date Low sugar / carb diet Stressed regular exercise, weight loss

## 2017-05-01 NOTE — Assessment & Plan Note (Signed)
No concerning symptoms Following with cardiology Continue current medications 

## 2017-05-01 NOTE — Assessment & Plan Note (Signed)
Check lipid panel, tsh, cmp  Continue daily statin Regular exercise and healthy diet encouraged  

## 2017-05-01 NOTE — Assessment & Plan Note (Signed)
BP well controlled Current regimen effective and well tolerated Continue current medications at current doses cmp  

## 2017-05-01 NOTE — Assessment & Plan Note (Signed)
Rate controlled On xarelto Check cbc

## 2017-05-07 ENCOUNTER — Other Ambulatory Visit: Payer: Self-pay | Admitting: Internal Medicine

## 2017-06-04 DIAGNOSIS — H2512 Age-related nuclear cataract, left eye: Secondary | ICD-10-CM | POA: Diagnosis not present

## 2017-07-05 ENCOUNTER — Other Ambulatory Visit: Payer: Self-pay | Admitting: Internal Medicine

## 2017-07-05 MED ORDER — ZOSTER VAC RECOMB ADJUVANTED 50 MCG/0.5ML IM SUSR: 0.5000 mL | Freq: Once | INTRAMUSCULAR | 0 refills | Status: AC

## 2017-07-05 NOTE — Progress Notes (Signed)
shin

## 2017-07-23 ENCOUNTER — Encounter: Payer: Self-pay | Admitting: Family

## 2017-07-23 ENCOUNTER — Ambulatory Visit: Payer: PPO | Admitting: Family

## 2017-07-23 VITALS — BP 160/90 | HR 68 | Temp 98.9°F | Ht 68.0 in | Wt 210.0 lb

## 2017-07-23 DIAGNOSIS — R05 Cough: Secondary | ICD-10-CM

## 2017-07-23 DIAGNOSIS — J209 Acute bronchitis, unspecified: Secondary | ICD-10-CM

## 2017-07-23 DIAGNOSIS — R059 Cough, unspecified: Secondary | ICD-10-CM

## 2017-07-23 MED ORDER — DOXYCYCLINE HYCLATE 100 MG PO TABS: 100.0000 mg | ORAL_TABLET | Freq: Two times a day (BID) | ORAL | 0 refills | Status: DC

## 2017-07-23 MED ORDER — PREDNISONE 20 MG PO TABS: 20.0000 mg | ORAL_TABLET | Freq: Every day | ORAL | 0 refills | Status: DC

## 2017-07-23 MED ORDER — ALBUTEROL SULFATE (2.5 MG/3ML) 0.083% IN NEBU
2.5000 mg | INHALATION_SOLUTION | Freq: Once | RESPIRATORY_TRACT | Status: AC
Start: 2017-07-23 — End: 2017-07-23
  Administered 2017-07-23: 2.5 mg via RESPIRATORY_TRACT

## 2017-07-23 NOTE — Patient Instructions (Signed)
Please watch your blood sugars with the daily prednisone. If you need to take extra Glimepiride while on this medication, it is fine. Increase fluids, rest and please follow-up if things are not better or worse.

## 2017-07-23 NOTE — Progress Notes (Signed)
Todd [name] is a 76 y.o. male with the following history as recorded in [employer]:  Patient Active Problem List   Diagnosis Date Noted  . Cough 12/04/2016  . 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 Hemorrhoids, internal, with bleeding 01/15/2015  . Bursitis of left shoulder 01/14/2015  . Type 2 diabetes, controlled, with peripheral circulatory disorder (Todd [name]) 12/05/2013  . PVD (peripheral vascular disease) (Todd [name]) 11/12/2013  . Atrial fibrillation with RVR (Todd [name]) 11/03/2013  . Carotid artery disease (Todd [name]) 12/20/2009  . OVERWEIGHT/OBESITY 11/17/2008  . Hyperlipidemia 07/20/2008  . Essential hypertension 07/20/2008  . Coronary atherosclerosis 07/20/2008  . ALLERGIC RHINITIS 07/20/2008  . NEPHROLITHIASIS, HX OF 07/20/2008    Current Outpatient Medications  Medication Sig Dispense Refill  . cetirizine (ZYRTEC) 10 MG tablet Take 10 mg by mouth daily as needed for allergies.     . Cholecalciferol (VITAMIN D-3) 5000 UNITS TABS Take 5,000 Units by mouth daily.     Marland Kitchen glimepiride (AMARYL) 2 MG tablet Take 1 tablet by mouth (2mg ) daily before breakfast 90 tablet 1  . glucose 4 GM chewable tablet Chew 1 tablet by mouth as needed for low blood sugar.    Marland Kitchen glucose blood (ONE TOUCH ULTRA TEST) test strip TEST BLOOD SUGAR ONCE DAILY 100 each 2  . metFORMIN (GLUCOPHAGE-XR) 500 MG 24 hr tablet Take 2 tablets (1,000 mg total) by mouth 2 times daily. 360 tablet 1  . metoprolol (LOPRESSOR) 50 MG tablet Take 1 tablet (50 mg total) by mouth 2 (two) times daily. 180 tablet 3  . nitroGLYCERIN (NITROSTAT) 0.4 MG SL tablet Place 1 tablet (0.4 mg total) under the tongue every 5 (five) minutes as needed for chest pain. 100 tablet 3  . omeprazole (PRILOSEC) 20 MG capsule Take 20 mg by mouth daily.    Todd [name] DELICA LANCETS 94B MISC TEST BLOOD SUGAR ONCE DAILY 100 each 3  . polycarbophil (FIBERCON) 625 MG tablet Take 625 mg by mouth 2 (two) times daily.     . simvastatin (ZOCOR) 40 MG tablet Take 1 tablet (40 mg total)  by mouth every evening. 90 tablet 3  . XARELTO 20 MG TABS tablet Take 1 tablet by mouth daily with supper. 90 tablet 1  . doxycycline (VIBRA-TABS) 100 MG tablet Take 1 tablet (100 mg total) by mouth 2 (two) times daily. 20 tablet 0  . predniSONE (DELTASONE) 20 MG tablet Take 1 tablet (20 mg total) by mouth daily with breakfast. 5 tablet 0   No current facility-administered medications for this visit.     Allergies: Amlodipine besylate and Ivp dye [iodinated diagnostic agents]  Past Medical History:  Diagnosis Date  . Allergic rhinitis   . Asthma   . Atrial fibrillation with rapid ventricular response ([name])    a. newly diagnosed 11/03/13, spont conv to NSR, placed on xarelto.  Marland Kitchen CAD (coronary artery disease)    a. 1999; s/p CABG: LIMA to LAD, SVG to OM1, SVG to OM2, SVG to RCA  b. Normal nuc 10/2013 (done because of new onset AF.)  . Diabetes mellitus ([name])   . HLD (hyperlipidemia)   . HTN (hypertension)   . Nephrolithiasis   . Overweight(278.02)   . PVD (peripheral vascular disease) (HCC)    Carotid stenosis  . Stroke [name])     Past Surgical History:  Procedure Laterality Date  . CAROTID ENDARTERECTOMY  1999  . COLONOSCOPY  2014   negative;Dr Sharlett Iles  . COLONOSCOPY W/ POLYPECTOMY  2009   Dr Sharlett Iles  .  CORONARY ARTERY BYPASS GRAFT  Aug.1999  . HEMORRHOID BANDING    . INTRACAPSULAR CATARACT EXTRACTION Bilateral 2018  . NASAL SINUS SURGERY      Family History  Problem Relation Age of Onset  . Prostate cancer Brother   . Hemochromatosis Brother   . Colon cancer Brother 97  . Heart attack Brother 13  . Asthma Sister   . Heart attack Mother 79  . Heart attack Brother 3  . Diabetes Father        IDDM  . Stroke Father 3    Social History   Tobacco Use  . Smoking status: Never Smoker  . Smokeless tobacco: Never Used  Substance Use Topics  . Alcohol use: No    Alcohol/week: 0.0 oz    Subjective:  3 day history of cough/ congestion; + Wheezing; denies any fever or  chest pain; has had multiple cases of bronchitis in the past; wife is sick with similar symptoms and currently on Doxycycline and steroid inhaler; Patient notes his blood pressure is always up at the office- he checks at home and is fine;   Objective:  Vitals:   07/23/17 1538  BP: (!) 160/90  Pulse: 68  Temp: 98.9 F (37.2 C)  SpO2: 98%  Weight: 210 lb (95.3 kg)  Height: 5\' 8"  (1.727 m)    General: Well developed, well nourished, in no acute distress  Skin : Warm and dry.  Head: Normocephalic and atraumatic  Eyes: Sclera and conjunctiva clear; pupils round and reactive to light; extraocular movements intact  Ears: External normal; canals clear; tympanic membranes normal  Oropharynx: Pink, supple. No suspicious lesions  Neck: Supple without thyromegaly, adenopathy  Lungs: Respirations unlabored; after nebulizer treatment, clear to auscultation bilaterally without wheeze, rales, rhonchi  CVS exam: normal rate and regular rhythm.  Neurologic: Alert and oriented; speech intact; face symmetrical; moves all extremities well; CNII-XII intact without focal deficit  Assessment:  1. Cough   2. Acute bronchitis, unspecified organism     Plan:  Albuterol nebulizer treatment given in office with good relief; Rx for Doxycycline 100 mg bid x 10 days; patient prefers not to use inhaler due to cost concerns- will treat with prednisone 20 mg qd x 5 days; he will monitor blood sugars and is aware that he can take extra Glimepiride if blood sugar increases; increases fluids, rest and follow-up worse, no better.   No Follow-up on file.  No orders of the defined types were placed in this encounter.   Requested Prescriptions   Signed Prescriptions Disp Refills  . doxycycline (VIBRA-TABS) 100 MG tablet 20 tablet 0    Sig: Take 1 tablet (100 mg total) by mouth 2 (two) times daily.  . predniSONE (DELTASONE) 20 MG tablet 5 tablet 0    Sig: Take 1 tablet (20 mg total) by mouth daily with breakfast.

## 2017-08-05 NOTE — Progress Notes (Signed)
HPI The patient presents for follow up of CAD and atrial fibrillation.  In March of 2015 he had a stress perfusion study demonstrating no evidence of ischemia or infarct with preserved ejection fraction.  Since I last saw him he has done well.  He is active.  The patient denies any new symptoms such as chest discomfort, neck or arm discomfort. There has been no new shortness of breath, PND or orthopnea. There have been no reported palpitations, presyncope or syncope.    Allergies  Allergen Reactions  . Amlodipine Besylate     Rash Because of a history of documented adverse serious drug reaction;Medi Alert bracelet  is recommended  . Ivp Dye [Iodinated Diagnostic Agents]     Rash Because of a history of documented adverse serious drug reaction;Medi Alert bracelet  is recommended    Current Outpatient Medications  Medication Sig Dispense Refill  . cetirizine (ZYRTEC) 10 MG tablet Take 10 mg by mouth daily as needed for allergies.     . Cholecalciferol (VITAMIN D-3) 5000 UNITS TABS Take 5,000 Units by mouth daily.     Marland Kitchen glimepiride (AMARYL) 2 MG tablet Take 1 tablet by mouth (2mg ) daily before breakfast 90 tablet 1  . glucose 4 GM chewable tablet Chew 1 tablet by mouth as needed for low blood sugar.    Marland Kitchen glucose blood (ONE TOUCH ULTRA TEST) test strip TEST BLOOD SUGAR ONCE DAILY 100 each 2  . metFORMIN (GLUCOPHAGE-XR) 500 MG 24 hr tablet Take 2 tablets (1,000 mg total) by mouth 2 times daily. 360 tablet 1  . metoprolol (LOPRESSOR) 50 MG tablet Take 1 tablet (50 mg total) by mouth 2 (two) times daily. 180 tablet 3  . nitroGLYCERIN (NITROSTAT) 0.4 MG SL tablet Place 1 tablet (0.4 mg total) under the tongue every 5 (five) minutes as needed for chest pain. 100 tablet 3  . omeprazole (PRILOSEC) 20 MG capsule Take 20 mg by mouth daily.    [name] DELICA LANCETS 78G MISC TEST BLOOD SUGAR ONCE DAILY 100 each 3  . polycarbophil (FIBERCON) 625 MG tablet Take 625 mg by mouth 2 (two) times  daily.     . simvastatin (ZOCOR) 40 MG tablet Take 1 tablet (40 mg total) by mouth every evening. 90 tablet 3  . XARELTO 20 MG TABS tablet Take 1 tablet by mouth daily with supper. 90 tablet 1   No current facility-administered medications for this visit.     Past Medical History:  Diagnosis Date  . Allergic rhinitis   . Asthma   . Atrial fibrillation with rapid ventricular response ([place])    a. newly diagnosed 11/03/13, spont conv to NSR, placed on xarelto.  Marland Kitchen CAD (coronary artery disease)    a. 1999; s/p CABG: LIMA to LAD, SVG to OM1, SVG to OM2, SVG to RCA  b. Normal nuc 10/2013 (done because of new onset AF.)  . Diabetes mellitus ([place])   . HLD (hyperlipidemia)   . HTN (hypertension)   . Nephrolithiasis   . Overweight(278.02)   . PVD (peripheral vascular disease) (HCC)    Carotid stenosis  . Stroke [facility])     Past Surgical History:  Procedure Laterality Date  . CAROTID ENDARTERECTOMY  1999  . COLONOSCOPY  2014   negative;Dr Sharlett Iles  . COLONOSCOPY W/ POLYPECTOMY  2009   Dr Sharlett Iles  . CORONARY ARTERY BYPASS GRAFT  Aug.1999  . HEMORRHOID BANDING    . INTRACAPSULAR CATARACT EXTRACTION Bilateral 2018  . NASAL SINUS SURGERY  ROS:     As stated in the HPI and negative for all other systems.    PHYSICAL EXAM BP 136/76 (BP Location: Left Arm, Patient Position: Sitting, Cuff Size: Large)   Pulse 73   Ht 5\' 8"  (1.727 m)   Wt 206 lb 3.2 oz (93.5 kg)   BMI 31.35 kg/m   GENERAL:  Well appearing NECK:  No jugular venous distention, waveform within normal limits, carotid upstroke brisk and symmetric, no bruits, no thyromegaly LUNGS:  Clear to auscultation bilaterally CHEST:  Unremarkable HEART:  PMI not displaced or sustained,S1 and S2 within normal limits, no S3, no S4, no clicks, no rubs, no murmurs ABD:  Flat, positive bowel sounds normal in frequency in pitch, no bruits, no rebound, no guarding, no midline pulsatile mass, no hepatomegaly, no splenomegaly EXT:  2 plus  pulses throughout, no edema, no cyanosis, no clubbing.    Lab Results  Component Value Date   CHOL 134 05/01/2017   TRIG 179.0 (H) 05/01/2017   HDL 33.10 (L) 05/01/2017   LDLCALC 66 05/01/2017   Lab Results  Component Value Date   HGBA1C 7.2 (H) 05/01/2017    EKG:  Sinus rhythm, rate 73 axis within normal limits, intervals within normal limits, no acute ST-T wave changes.   08/06/2017  ASSESSMENT AND PLAN  ATRIAL FIB - He does not notice this.  He tolerates anticoagulation. Mr. Shalev Helminiak Poteat has a CHA2DS2 - VASc score of 6 he will remain on anticoagulatoin.   C A D -  The patient has no new sypmtoms.  No further cardiovascular testing is indicated.  We will continue with aggressive risk reduction and meds as listed.  CAROTID ARTERY DISEASE -  In Sept of 2016  he had sttable 40-59% RICA stenosis and 6-30% LICA stenosis, s/p CEA with DPA.  He is overdue for follow up.  I will schedule this.   HYPERLIPIDEMIA -  His labs are as above.  Continue current therapy.   OVERWEIGHT/OBESITY -   I applaud some slight weight loss.    HYPERTENSION - The blood pressure is at target. No change in medications is indicated. We will continue with therapeutic lifestyle changes (TLC).  DM A1C continues to improve as above.

## 2017-08-06 ENCOUNTER — Ambulatory Visit: Payer: PPO | Admitting: Cardiology

## 2017-08-06 ENCOUNTER — Encounter: Payer: Self-pay | Admitting: Cardiology

## 2017-08-06 VITALS — BP 136/76 | HR 73 | Ht 68.0 in | Wt 206.2 lb

## 2017-08-06 DIAGNOSIS — E785 Hyperlipidemia, unspecified: Secondary | ICD-10-CM | POA: Diagnosis not present

## 2017-08-06 DIAGNOSIS — I2510 Atherosclerotic heart disease of native coronary artery without angina pectoris: Secondary | ICD-10-CM

## 2017-08-06 DIAGNOSIS — I6523 Occlusion and stenosis of bilateral carotid arteries: Secondary | ICD-10-CM | POA: Diagnosis not present

## 2017-08-06 DIAGNOSIS — I10 Essential (primary) hypertension: Secondary | ICD-10-CM | POA: Diagnosis not present

## 2017-08-06 DIAGNOSIS — I480 Paroxysmal atrial fibrillation: Secondary | ICD-10-CM

## 2017-08-06 NOTE — Patient Instructions (Signed)
Medication Instructions:  Continue current medications  If you need a refill on your cardiac medications before your next appointment, please call your pharmacy.  Labwork: None Ordered   Testing/Procedures: Your physician has requested that you have a carotid duplex. This test is an ultrasound of the carotid arteries in your neck. It looks at blood flow through these arteries that supply the brain with blood. Allow one hour for this exam. There are no restrictions or special instructions.  Follow-Up: Your physician wants you to follow-up in: 1 Year. You should receive a reminder letter in the mail two months in advance. If you do not receive a letter, please call our office [phone].    Thank you for choosing [facility]!!      

## 2017-08-07 DIAGNOSIS — G245 Blepharospasm: Secondary | ICD-10-CM | POA: Diagnosis not present

## 2017-08-13 ENCOUNTER — Encounter: Payer: Self-pay | Admitting: Internal Medicine

## 2017-08-13 ENCOUNTER — Ambulatory Visit: Payer: PPO | Admitting: Internal Medicine

## 2017-08-13 VITALS — BP 142/88 | HR 85 | Temp 98.1°F | Ht 68.0 in | Wt 206.0 lb

## 2017-08-13 DIAGNOSIS — E1151 Type 2 diabetes mellitus with diabetic peripheral angiopathy without gangrene: Secondary | ICD-10-CM

## 2017-08-13 DIAGNOSIS — L509 Urticaria, unspecified: Secondary | ICD-10-CM | POA: Diagnosis not present

## 2017-08-13 MED ORDER — PREDNISONE 10 MG PO TABS: ORAL_TABLET | ORAL | 0 refills | Status: DC

## 2017-08-13 NOTE — Progress Notes (Signed)
Subjective:    Patient ID: Todd [name], male    DOB: [birth year], 76 y.o.   MRN: [id number]  HPI He is here for an acute visit.  He has a rash.  The rash started about 3-4 weeks ago.  The rash is very itchy.  The rash covers his entire body.  He will get new lesions in the old ones will go away.  He is unsure of the exact cause.  He recently took doxycycline and prednisone, which were both started 12/3.  He is fairly confident he had the rash prior to starting those medications because the itching got better after being on the prednisone.  He thinks the rash is secondary to new flannel sheets.  He denies any other new products, medications or supplements.  He denies fevers, chills, cough, wheeze, shortness of breath, sore throat, difficulty swallowing, headaches and lightheadedness.  He is taking his Zyrtec daily.  He did take Tylenol PM and that seemed to help minimally with the itching.    Medications and allergies reviewed with patient and updated if appropriate.  Patient Active Problem List   Diagnosis Date Noted  . Cough 12/04/2016  . 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 Hemorrhoids, internal, with bleeding 01/15/2015  . Bursitis of left shoulder 01/14/2015  . Type 2 diabetes, controlled, with peripheral circulatory disorder ([place]) 12/05/2013  . PVD (peripheral vascular disease) ([place]) 11/12/2013  . Atrial fibrillation with RVR ([place]) 11/03/2013  . Carotid artery disease ([place]) 12/20/2009  . OVERWEIGHT/OBESITY 11/17/2008  . Hyperlipidemia 07/20/2008  . Essential hypertension 07/20/2008  . Coronary atherosclerosis 07/20/2008  . ALLERGIC RHINITIS 07/20/2008  . NEPHROLITHIASIS, HX OF 07/20/2008    Current Outpatient Medications on File Prior to Visit  Medication Sig Dispense Refill  . cetirizine (ZYRTEC) 10 MG tablet Take 10 mg by mouth daily as needed for allergies.     . Cholecalciferol (VITAMIN D-3) 5000 UNITS TABS Take 5,000 Units by mouth daily.     Marland Kitchen glimepiride (AMARYL) 2  MG tablet Take 1 tablet by mouth ('2mg'$ ) daily before breakfast 90 tablet 1  . glucose 4 GM chewable tablet Chew 1 tablet by mouth as needed for low blood sugar.    Marland Kitchen glucose blood (ONE TOUCH ULTRA TEST) test strip TEST BLOOD SUGAR ONCE DAILY 100 each 2  . metFORMIN (GLUCOPHAGE-XR) 500 MG 24 hr tablet Take 2 tablets (1,000 mg total) by mouth 2 times daily. 360 tablet 1  . metoprolol (LOPRESSOR) 50 MG tablet Take 1 tablet (50 mg total) by mouth 2 (two) times daily. 180 tablet 3  . nitroGLYCERIN (NITROSTAT) 0.4 MG SL tablet Place 1 tablet (0.4 mg total) under the tongue every 5 (five) minutes as needed for chest pain. 100 tablet 3  . omeprazole (PRILOSEC) 20 MG capsule Take 20 mg by mouth daily.    [name] DELICA LANCETS 99991111 MISC TEST BLOOD SUGAR ONCE DAILY 100 each 3  . polycarbophil (FIBERCON) 625 MG tablet Take 625 mg by mouth 2 (two) times daily.     . simvastatin (ZOCOR) 40 MG tablet Take 1 tablet (40 mg total) by mouth every evening. 90 tablet 3  . XARELTO 20 MG TABS tablet Take 1 tablet by mouth daily with supper. 90 tablet 1   No current facility-administered medications on file prior to visit.     Past Medical History:  Diagnosis Date  . Allergic rhinitis   . Asthma   . Atrial fibrillation with rapid ventricular response ([place])  a. newly diagnosed 11/03/13, spont conv to NSR, placed on xarelto.  Marland Kitchen CAD (coronary artery disease)    a. 1999; s/p CABG: LIMA to LAD, SVG to OM1, SVG to OM2, SVG to RCA  b. Normal nuc 10/2013 (done because of new onset AF.)  . Diabetes mellitus ([place])   . HLD (hyperlipidemia)   . HTN (hypertension)   . Nephrolithiasis   . Overweight(278.02)   . PVD (peripheral vascular disease) (HCC)    Carotid stenosis  . Stroke [facility])     Past Surgical History:  Procedure Laterality Date  . CAROTID ENDARTERECTOMY  1999  . COLONOSCOPY  2014   negative;Dr Sharlett Iles  . COLONOSCOPY W/ POLYPECTOMY  2009   Dr Sharlett Iles  . CORONARY ARTERY BYPASS GRAFT  Aug.1999  .  HEMORRHOID BANDING    . INTRACAPSULAR CATARACT EXTRACTION Bilateral 2018  . NASAL SINUS SURGERY      Social History   Socioeconomic History  . Marital status: Married    Spouse name: [name]  . Number of children: 2  . Years of education: None  . Highest education level: None  Social Needs  . Financial resource strain: None  . Food insecurity - worry: None  . Food insecurity - inability: None  . Transportation needs - medical: None  . Transportation needs - non-medical: None  Occupational History  . Occupation: Sales Manager-retired  Tobacco Use  . Smoking status: Never Smoker  . Smokeless tobacco: Never Used  Substance and Sexual Activity  . Alcohol use: No    Alcohol/week: 0.0 oz  . Drug use: No  . Sexual activity: None  Other Topics Concern  . None  Social History Narrative  . None    Family History  Problem Relation Age of Onset  . Prostate cancer Brother   . Hemochromatosis Brother   . Colon cancer Brother 25  . Heart attack Brother 79  . Asthma Sister   . Heart attack Mother 31  . Heart attack Brother 53  . Diabetes Father        IDDM  . Stroke Father 77    Review of Systems  Constitutional: Negative for chills and fever.  HENT: Negative for sore throat and trouble swallowing.   Respiratory: Negative for cough, shortness of breath and wheezing.   Skin: Positive for rash.  Neurological: Negative for light-headedness and headaches.       Objective:   Vitals:   08/13/17 1052  BP: (!) 142/88  Pulse: 85  Temp: 98.1 F (36.7 C)  SpO2: 98%   Wt Readings from Last 3 Encounters:  08/13/17 206 lb (93.4 kg)  08/06/17 206 lb 3.2 oz (93.5 kg)  07/23/17 210 lb (95.3 kg)   Body mass index is 31.32 kg/m.   Physical Exam  Constitutional: He is oriented to person, place, and time. He appears well-developed and well-nourished. No distress.  HENT:  Head: Normocephalic and atraumatic.  Eyes: Conjunctivae are normal.  Musculoskeletal: He exhibits no  edema.  Neurological: He is alert and oriented to person, place, and time.  Skin: Skin is warm and dry. Rash (Diffuse blotchy erythematous patches combined with erythematous papules--like lesions.  No open wounds, ulcers.  Rash covers arms, chest, back, neck and legs) noted. He is not diaphoretic.            Assessment & Plan:    See Problem List for Assessment and Plan of chronic medical problems.

## 2017-08-13 NOTE — Patient Instructions (Addendum)
Take zyrtec 10 mg daily.  You can take benadryl as needed.   Take the prednisone taper.    Hives Hives (urticaria) are itchy, red, swollen areas on your skin. Hives can appear on any part of your body and can vary in size. They can be as small as the tip of a pen or much larger. Hives often fade within 24 hours (acute hives). In other cases, new hives appear after old ones fade. This cycle can continue for several days or weeks (chronic hives). Hives result from your body's reaction to an irritant or to something that you are allergic to (trigger). When you are exposed to a trigger, your body releases a chemical (histamine) that causes redness, itching, and swelling. You can get hives immediately after being exposed to a trigger or hours later. Hives do not spread from person to person (are not contagious). Your hives may get worse with scratching, exercise, and emotional stress. What are the causes? Causes of this condition include:  Allergies to certain foods or ingredients.  Insect bites or stings.  Exposure to pollen or pet dander.  Contact with latex or chemicals.  Spending time in sunlight, heat, or cold (exposure).  Exercise.  Stress.  You can also get hives from some medical conditions and treatments. These include:  Viruses, including the common cold.  Bacterial infections, such as urinary tract infections and strep throat.  Disorders such as vasculitis, lupus, or thyroid disease.  Certain medications.  Allergy shots.  Blood transfusions.  Sometimes, the cause of hives is not known (idiopathic hives). What increases the risk? This condition is more likely to develop in:  Women.  People who have food allergies, especially to citrus fruits, milk, eggs, peanuts, tree nuts, or shellfish.  People who are allergic to: ? Medicines. ? Latex. ? Insects. ? Animals. ? Pollen.  People who have certain medical conditions, includinglupus or thyroid disease.  What  are the signs or symptoms? The main symptom of this condition is raised, itchyred or white bumps or patches on your skin. These areas may:  Become large and swollen (welts).  Change in shape and location, quickly and repeatedly.  Be separate hives or connect over a large area of skin.  Sting or become painful.  Turn white when pressed in the center (blanch).  In severe cases, yourhands, feet, and face may also become swollen. This may occur if hives develop deeper in your skin. How is this diagnosed? This condition is diagnosed based on your symptoms, medical history, and physical exam. Your skin, urine, or blood may be tested to find out what is causing your hives and to rule out other health issues. Your health care provider may also remove a small sample of skin from the affected area and examine it under a microscope (biopsy). How is this treated? Treatment depends on the severity of your condition. Your health care provider may recommend using cool, wet cloths (cool compresses) or taking cool showers to relieve itching. Hives are sometimes treated with medicines, including:  Antihistamines.  Corticosteroids.  Antibiotics.  An injectable medicine (omalizumab). Your health care provider may prescribe this if you have chronic idiopathic hives and you continue to have symptoms even after treatment with antihistamines.  Severe cases may require an emergency injection of adrenaline (epinephrine) to prevent a life-threatening allergic reaction (anaphylaxis). Follow these instructions at home: Medicines  Take or apply over-the-counter and prescription medicines only as told by your health care provider.  If you were prescribed an  antibiotic medicine, use it as told by your health care provider. Do not stop taking the antibiotic even if you start to feel better. Skin Care  Apply cool compresses to the affected areas.  Do not scratch or rub your skin. General instructions  Do  not take hot showers or baths. This can make itching worse.  Do not wear tight-fitting clothing.  Use sunscreen and wear protective clothing when you are outside.  Avoid any substances that cause your hives. Keep a journal to help you track what causes your hives. Write down: ? What medicines you take. ? What you eat and drink. ? What products you use on your skin.  Keep all follow-up visits as told by your health care provider. This is important. Contact a health care provider if:  Your symptoms are not controlled with medicine.  Your joints are painful or swollen. Get help right away if:  You have a fever.  You have pain in your abdomen.  Your tongue or lips are swollen.  Your eyelids are swollen.  Your chest or throat feels tight.  You have trouble breathing or swallowing. These symptoms may represent a serious problem that is an emergency. Do not wait to see if the symptoms will go away. Get medical help right away. Call your local emergency services (911 in the [place] Do not drive yourself to the hospital. This information is not intended to replace advice given to you by your health care provider. Make sure you discuss any questions you have with your health care provider. Document Released: 08/07/2005 Document Revised: 01/05/2016 Document Reviewed: 05/26/2015 Elsevier Interactive Patient Education  2018 [employer].

## 2017-08-13 NOTE — Assessment & Plan Note (Signed)
Allergic reaction-? cause Possibly related to new flannel sheets No other likely causes.  Rash seemed to start prior to taking the antibiotic and prednisone Prednisone taper Continue Zyrtec Take Benadryl as needed for itching Call if no improvement

## 2017-08-13 NOTE — Assessment & Plan Note (Signed)
Sugars have been well controlled They did increase slightly with his recent redness around and he will monitor closely Advised him to take an extra glimepiride if his sugars are elevated Call with any questions or concerns

## 2017-08-23 ENCOUNTER — Telehealth: Payer: Self-pay | Admitting: *Deleted

## 2017-08-23 ENCOUNTER — Ambulatory Visit (HOSPITAL_COMMUNITY)
Admission: RE | Admit: 2017-08-23 | Discharge: 2017-08-23 | Disposition: A | Discharge status: Home / Self Care | Payer: PPO | Source: Ambulatory Visit | Attending: Cardiovascular Disease | Admitting: Cardiovascular Disease

## 2017-08-23 DIAGNOSIS — I6523 Occlusion and stenosis of bilateral carotid arteries: Secondary | ICD-10-CM | POA: Diagnosis not present

## 2017-08-23 MED ORDER — OMEPRAZOLE 20 MG PO CPDR: 20.0000 mg | DELAYED_RELEASE_CAPSULE | Freq: Every day | ORAL | 0 refills | Status: DC

## 2017-08-23 MED ORDER — METOPROLOL TARTRATE 50 MG PO TABS: 50.0000 mg | ORAL_TABLET | Freq: Two times a day (BID) | ORAL | 3 refills | Status: DC

## 2017-08-23 MED ORDER — XARELTO 20 MG PO TABS: ORAL_TABLET | ORAL | 3 refills | Status: DC

## 2017-08-23 MED ORDER — SIMVASTATIN 40 MG PO TABS: 40.0000 mg | ORAL_TABLET | Freq: Every evening | ORAL | 3 refills | Status: DC

## 2017-08-23 NOTE — Telephone Encounter (Signed)
WIFE  AWARE RX E-SENT EXCEPT METFORMIN.   (SIMVSTATIN , METOPROLOL, #90 SUPPLY X3  OMEPRAZOLE x1

## 2017-09-12 DIAGNOSIS — L308 Other specified dermatitis: Secondary | ICD-10-CM | POA: Diagnosis not present

## 2017-09-19 DIAGNOSIS — Z961 Presence of intraocular lens: Secondary | ICD-10-CM | POA: Diagnosis not present

## 2017-09-19 DIAGNOSIS — H26492 Other secondary cataract, left eye: Secondary | ICD-10-CM | POA: Diagnosis not present

## 2017-10-17 ENCOUNTER — Other Ambulatory Visit: Payer: Self-pay | Admitting: Internal Medicine

## 2017-10-17 NOTE — Addendum Note (Signed)
Addended by: Therisa Doyne on: 10/17/2017 04:33 PM   Modules accepted: Orders

## 2017-10-26 NOTE — Progress Notes (Addendum)
Subjective:   Todd [name] is a 77 y.o. male who presents for Medicare Annual/Subsequent preventive examination.  Review of Systems:  No ROS.  Medicare Wellness Visit. Additional risk factors are reflected in the social history.  Cardiac Risk Factors include: advanced age (>27mn, >>59women);male gender;diabetes mellitus;dyslipidemia;hypertension Sleep patterns: gets up 1-2 times nightly to void and sleeps 5-6 hours nightly.   Home Safety/Smoke Alarms: Feels safe in home. Smoke alarms in place.  Living environment; residence and Firearm Safety: 1-story house/ trailer, no firearms Lives with wife, no needs for DME, good support system. Seat Belt Safety/Bike Helmet: Wears seat belt.   PSA-  Lab Results  Component Value Date   PSA 0.24 07/20/2008       Objective:    Vitals: BP (!) 148/84   Pulse 74   Temp 97.7 F (36.5 C)   Resp 18   Ht '5\' 8"'$  (1.727 m)   Wt 201 lb (91.2 kg)   SpO2 98%   BMI 30.56 kg/m   Body mass index is 30.56 kg/m.  Advanced Directives 10/29/2017 01/15/2015 11/11/2014 11/04/2013  Does Patient Have a Medical Advance Directive? Yes Yes No Patient has advance directive, copy not in chart  Type of Advance Directive HEt[place]Living will - - Living will  Does patient want to make changes to medical advance directive? - - - No change requested  Copy of [place] Chart? No - copy requested Yes - -  Pre-existing out of facility DNR order (yellow form or pink MOST form) - - - No    Tobacco Social History   Tobacco Use  Smoking Status Never Smoker  Smokeless Tobacco Never Used     Counseling given: Not Answered  Past Medical History:  Diagnosis Date  . Allergic rhinitis   . Asthma   . Atrial fibrillation with rapid ventricular response ([facility]    a. newly diagnosed 11/03/13, spont conv to NSR, placed on xarelto.  .Marland KitchenCAD (coronary artery disease)    a. 1999; s/p CABG: LIMA to LAD, SVG to OM1, SVG to OM2, SVG to RCA   b. Normal nuc 10/2013 (done because of new onset AF.)  . Diabetes mellitus ([facility]   . HLD (hyperlipidemia)   . HTN (hypertension)   . Nephrolithiasis   . Overweight(278.02)   . PVD (peripheral vascular disease) ([facility])    Carotid stenosis  . Stroke ([facility]    Past Surgical History:  Procedure Laterality Date  . CAROTID ENDARTERECTOMY  1999  . COLONOSCOPY  2014   negative;Dr PSharlett Iles . COLONOSCOPY W/ POLYPECTOMY  2009   Dr PSharlett Iles . CORONARY ARTERY BYPASS GRAFT  Aug.1999  . HEMORRHOID BANDING    . INTRACAPSULAR CATARACT EXTRACTION Bilateral 2018  . NASAL SINUS SURGERY     Family History  Problem Relation Age of Onset  . Prostate cancer Brother   . Hemochromatosis Brother   . Colon cancer Brother 798 . Heart attack Brother 532 . Asthma Sister   . Heart attack Mother 563 . Heart attack Brother 511 . Diabetes Father        IDDM  . Stroke Father 843  Social History   Socioeconomic History  . Marital status: Married    Spouse name: Todd [name] . Number of children: 2  . Years of education: None  . Highest education level: None  Social Needs  . Financial resource strain: Not hard at all  . Food insecurity -  worry: Never true  . Food insecurity - inability: Never true  . Transportation needs - medical: No  . Transportation needs - non-medical: No  Occupational History  . Occupation: Sales Manager-retired  Tobacco Use  . Smoking status: Never Smoker  . Smokeless tobacco: Never Used  Substance and Sexual Activity  . Alcohol use: No    Alcohol/week: 0.0 oz  . Drug use: No  . Sexual activity: Yes  Other Topics Concern  . None  Social History Narrative  . None    Outpatient Encounter Medications as of 10/29/2017  Medication Sig  . cetirizine (ZYRTEC) 10 MG tablet Take 10 mg by mouth daily as needed for allergies.   . Cholecalciferol (VITAMIN D-3) 5000 UNITS TABS Take 5,000 Units by mouth daily.   Marland Kitchen glimepiride (AMARYL) 2 MG tablet Take 1 tablet by mouth daily before  breakfast  . glucose 4 GM chewable tablet Chew 1 tablet by mouth as needed for low blood sugar.  Marland Kitchen glucose blood (ONE TOUCH ULTRA TEST) test strip Test blood sugar once daily  . metFORMIN (GLUCOPHAGE-XR) 500 MG 24 hr tablet Take 2 tablets (1,'000mg'$  total) by mouth twice a day  . metoprolol tartrate (LOPRESSOR) 50 MG tablet Take 1 tablet (50 mg total) by mouth 2 (two) times daily.  . nitroGLYCERIN (NITROSTAT) 0.4 MG SL tablet Place 1 tablet (0.4 mg total) under the tongue every 5 (five) minutes as needed for chest pain.  Marland Kitchen omeprazole (PRILOSEC) 20 MG capsule Take 1 capsule (20 mg total) by mouth daily.  [name] DELICA LANCETS 99991111 MISC TEST BLOOD SUGAR ONCE DAILY  . polycarbophil (FIBERCON) 625 MG tablet Take 625 mg by mouth 2 (two) times daily.   . simvastatin (ZOCOR) 40 MG tablet Take 1 tablet (40 mg total) by mouth every evening.  Alveda Reasons 20 MG TABS tablet Take 1 tablet by mouth daily with supper.  . [DISCONTINUED] predniSONE (DELTASONE) 10 MG tablet Take 4 tabs po qd x 3 days, then 3 tabs po qd x 3 days, then 2 tabs po qd x 3 days, then 1 tab po qd x 3 days (Patient not taking: Reported on 10/29/2017)   No facility-administered encounter medications on file as of 10/29/2017.     Activities of Daily Living In your present state of health, do you have any difficulty performing the following activities: 10/29/2017  Hearing? Y  Vision? N  Difficulty concentrating or making decisions? N  Walking or climbing stairs? N  Dressing or bathing? N  Doing errands, shopping? N  Preparing Food and eating ? N  Using the Toilet? N  In the past six months, have you accidently leaked urine? N  Do you have problems with loss of bowel control? N  Managing your Medications? N  Managing your Finances? N  Housekeeping or managing your Housekeeping? N  Some recent data might be hidden    Patient Care Team: Binnie Rail, MD as PCP - General (Internal Medicine) Gatha Mayer, MD as Consulting  Physician (Gastroenterology) Minus Breeding, MD as Consulting Physician (Cardiology)   Assessment:   This is a routine wellness examination for Todd [name]. Physical assessment deferred to PCP.   Exercise Activities and Dietary recommendations Current Exercise Habits: Home exercise routine, Time (Minutes): 40, Frequency (Times/Week): 3, Weekly Exercise (Minutes/Week): 120, Intensity: Mild, Exercise limited by: orthopedic condition(s) Diet (meal preparation, eat out, water intake, caffeinated beverages, dairy products, fruits and vegetables): in general, a "healthy" diet  , well balanced   Reviewed heart  healthy and diabetic diet, encouraged patient to increase daily water intake.  Goals      Patient Stated   . <enter goal here> (pt-stated)     Hx of exercise in gym; Walks out in yard Goal weight is 190;  Continue goal of no white; plant fat      Other   . Patient Stated     I would like to get back to going to the gym. I will just go to the gym 3 times a week.       Fall Risk Fall Risk  10/29/2017 05/01/2017 04/27/2016 01/15/2015 09/09/2014  Falls in the past year? No No No No No   Depression Screen PHQ 2/9 Scores 10/29/2017 05/01/2017 04/27/2016 01/15/2015  PHQ - 2 Score 0 0 0 0  PHQ- 9 Score 2 - - -    Cognitive Function MMSE - Mini Mental State Exam 01/15/2015  Not completed: Unable to complete       Ad8 score reviewed for issues:  Issues making decisions: no  Less interest in hobbies / activities: no  Repeats questions, stories (family complaining): no  Trouble using ordinary gadgets (microwave, computer, phone):no  Forgets the month or year: no  Mismanaging finances: no  Remembering appts: no  Daily problems with thinking and/or memory: no Ad8 score is= 0  Immunization History  Administered Date(s) Administered  . Influenza Split 07/06/2011, 05/15/2012  . Influenza Whole 05/12/2010  . Influenza, High Dose Seasonal PF 05/30/2017  . Influenza,inj,Quad PF,6+ Mos  05/21/2013  . Influenza-Unspecified 05/20/2014, 06/11/2015, 05/24/2016  . Pneumococcal Conjugate-13 01/15/2015  . Pneumococcal Polysaccharide-23 05/22/2005, 05/05/2010  . Tdap 12/03/2012  . Zoster 10/20/2010   Screening Tests Health Maintenance  Topic Date Due  . URINE MICROALBUMIN  10/25/2017  . HEMOGLOBIN A1C  10/29/2017  . COLONOSCOPY  11/11/2017  . OPHTHALMOLOGY EXAM  11/30/2017  . FOOT EXAM  10/30/2018  . TETANUS/TDAP  12/04/2022  . INFLUENZA VACCINE  Completed  . PNA vac Low Risk Adult  Completed      Plan:    Continue doing brain stimulating activities (puzzles, reading, adult coloring books, staying active) to keep memory sharp.   Continue to eat heart healthy diet (full of fruits, vegetables, whole grains, lean protein, water--limit salt, fat, and sugar intake) and increase physical activity as tolerated.   I have personally reviewed and noted the following in the patient's chart:   . Medical and social history . Use of alcohol, tobacco or illicit drugs  . Current medications and supplements . Functional ability and status . Nutritional status . Physical activity . Advanced directives . List of other physicians . Vitals . Screenings to include cognitive, depression, and falls . Referrals and appointments  In addition, I have reviewed and discussed with patient certain preventive protocols, quality metrics, and best practice recommendations. A written personalized care plan for preventive services as well as general preventive health recommendations were provided to patient.     Michiel Cowboy, RN  10/29/2017   Medical screening examination/treatment/procedure(s) were performed by non-physician practitioner and as supervising physician I was immediately available for consultation/collaboration. I agree with above. Binnie Rail, MD

## 2017-10-27 NOTE — Progress Notes (Signed)
Subjective:    Patient ID: Todd [name], male    DOB: 1941/06/15, 77 y.o.   MRN: [id number]  HPI He is here for a physical exam.   His sugars has been higher at home - today it was 200, 156.  He is doing good with his eating, but could do better.  He is not exercising.  He walked some when the weather was good.    He has no concerns.   He has had lingering cold symptoms, but they are mild.    Medications and allergies reviewed with patient and updated if appropriate.  Patient Active Problem List   Diagnosis Date Noted  . Hives 08/13/2017  . Cough 12/04/2016  . 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 Hemorrhoids, internal, with bleeding 01/15/2015  . Bursitis of left shoulder 01/14/2015  . Type 2 diabetes, controlled, with peripheral circulatory disorder ([place]) 12/05/2013  . PVD (peripheral vascular disease) ([place]) 11/12/2013  . Atrial fibrillation with RVR ([place]) 11/03/2013  . Carotid artery disease ([place]) 12/20/2009  . OVERWEIGHT/OBESITY 11/17/2008  . Hyperlipidemia 07/20/2008  . Essential hypertension 07/20/2008  . Coronary atherosclerosis 07/20/2008  . ALLERGIC RHINITIS 07/20/2008  . NEPHROLITHIASIS, HX OF 07/20/2008    Current Outpatient Medications on File Prior to Visit  Medication Sig Dispense Refill  . cetirizine (ZYRTEC) 10 MG tablet Take 10 mg by mouth daily as needed for allergies.     . Cholecalciferol (VITAMIN D-3) 5000 UNITS TABS Take 5,000 Units by mouth daily.     Marland Kitchen glimepiride (AMARYL) 2 MG tablet Take 1 tablet by mouth daily before breakfast 90 tablet 0  . glucose 4 GM chewable tablet Chew 1 tablet by mouth as needed for low blood sugar.    Marland Kitchen glucose blood (ONE TOUCH ULTRA TEST) test strip Test blood sugar once daily 100 each 1  . metFORMIN (GLUCOPHAGE-XR) 500 MG 24 hr tablet Take 2 tablets (1,000mg  total) by mouth twice a day 360 tablet 0  . metoprolol tartrate (LOPRESSOR) 50 MG tablet Take 1 tablet (50 mg total) by mouth 2 (two) times daily. 180 tablet 3  .  nitroGLYCERIN (NITROSTAT) 0.4 MG SL tablet Place 1 tablet (0.4 mg total) under the tongue every 5 (five) minutes as needed for chest pain. 100 tablet 3  . omeprazole (PRILOSEC) 20 MG capsule Take 1 capsule (20 mg total) by mouth daily. 90 capsule 0  . ONETOUCH DELICA LANCETS 67Y MISC TEST BLOOD SUGAR ONCE DAILY 100 each 3  . polycarbophil (FIBERCON) 625 MG tablet Take 625 mg by mouth 2 (two) times daily.     . simvastatin (ZOCOR) 40 MG tablet Take 1 tablet (40 mg total) by mouth every evening. 90 tablet 3  . XARELTO 20 MG TABS tablet Take 1 tablet by mouth daily with supper. 90 tablet 3   No current facility-administered medications on file prior to visit.     Past Medical History:  Diagnosis Date  . Allergic rhinitis   . Asthma   . Atrial fibrillation with rapid ventricular response ([place])    a. newly diagnosed 11/03/13, spont conv to NSR, placed on xarelto.  Marland Kitchen CAD (coronary artery disease)    a. 1999; s/p CABG: LIMA to LAD, SVG to OM1, SVG to OM2, SVG to RCA  b. Normal nuc 10/2013 (done because of new onset AF.)  . Diabetes mellitus ([place])   . HLD (hyperlipidemia)   . HTN (hypertension)   . Nephrolithiasis   . Overweight(278.02)   . PVD (peripheral vascular  disease) ([place])    Carotid stenosis  . Stroke [facility])     Past Surgical History:  Procedure Laterality Date  . CAROTID ENDARTERECTOMY  1999  . COLONOSCOPY  2014   negative;Dr Sharlett Iles  . COLONOSCOPY W/ POLYPECTOMY  2009   Dr Sharlett Iles  . CORONARY ARTERY BYPASS GRAFT  Aug.1999  . HEMORRHOID BANDING    . INTRACAPSULAR CATARACT EXTRACTION Bilateral 2018  . NASAL SINUS SURGERY      Social History   Socioeconomic History  . Marital status: Married    Spouse name: [name]  . Number of children: 2  . Years of education: None  . Highest education level: None  Social Needs  . Financial resource strain: Not hard at all  . Food insecurity - worry: Never true  . Food insecurity - inability: Never true  . Transportation needs -  medical: No  . Transportation needs - non-medical: No  Occupational History  . Occupation: Sales Manager-retired  Tobacco Use  . Smoking status: Never Smoker  . Smokeless tobacco: Never Used  Substance and Sexual Activity  . Alcohol use: No    Alcohol/week: 0.0 oz  . Drug use: No  . Sexual activity: Yes  Other Topics Concern  . None  Social History Narrative  . None    Family History  Problem Relation Age of Onset  . Prostate cancer Brother   . Hemochromatosis Brother   . Colon cancer Brother 35  . Heart attack Brother 17  . Asthma Sister   . Heart attack Mother 72  . Heart attack Brother 42  . Diabetes Father        IDDM  . Stroke Father 41    Review of Systems  Constitutional: Negative for chills and fever.  HENT: Positive for congestion. Negative for ear pain, sinus pain and sore throat.   Eyes: Negative for visual disturbance.  Respiratory: Positive for cough (dry). Negative for shortness of breath and wheezing.   Cardiovascular: Negative for chest pain, palpitations and leg swelling.  Gastrointestinal: Positive for anal bleeding. Negative for abdominal pain, blood in stool, constipation, diarrhea and nausea.       No gerd  Genitourinary: Negative for dysuria and hematuria.  Musculoskeletal: Positive for arthralgias (right groin pain - with walking). Negative for back pain.  Skin:       Dryness in ski - eczema  Neurological: Positive for dizziness (occasional). Negative for light-headedness and headaches.  Psychiatric/Behavioral: Negative for dysphoric mood. The patient is not nervous/anxious.        Objective:   Vitals:   10/29/17 0911  BP: (!) 148/84  Pulse: 74  Resp: 16  Temp: 97.7 F (36.5 C)  SpO2: 98%   Filed Weights   10/29/17 0911  Weight: 201 lb (91.2 kg)   Body mass index is 30.56 kg/m.  Wt Readings from Last 3 Encounters:  10/29/17 201 lb (91.2 kg)  10/29/17 201 lb (91.2 kg)  08/13/17 206 lb (93.4 kg)     Physical  Exam Constitutional: He appears well-developed and well-nourished. No distress.  HENT:  Head: Normocephalic and atraumatic.  Right Ear: External ear normal.  Left Ear: External ear normal.  Mouth/Throat: Oropharynx is clear and moist.  Normal ear canals and TM b/l  Eyes: Conjunctivae and EOM are normal.  Neck: Neck supple. No tracheal deviation present. No thyromegaly present.  No carotid bruit  Cardiovascular: Normal rate, regular rhythm, normal heart sounds and intact distal pulses.   No murmur heard. Pulmonary/Chest: Effort normal  and breath sounds normal. No respiratory distress. He has no wheezes. He has no rales.  Abdominal: Soft. He exhibits no distension. There is no tenderness.  Genitourinary: deferred  Musculoskeletal: He exhibits no edema.  Lymphadenopathy:   He has no cervical adenopathy.  Skin: Skin is warm and dry. He is not diaphoretic.  Psychiatric: He has a normal mood and affect. His behavior is normal.         Assessment & Plan:   Physical exam: Screening blood work ordered Immunizations  Discussed shingrix, others up to date Colonoscopy  Up to date  Eye exams   Up to date  EKG  Last done 07/2017 Exercise  Minimal, irregular - encouraged regular walking Weight    Advised weight loss Skin      no concerns Substance abuse   none  See Problem List for Assessment and Plan of chronic medical problems.   FU in 6 months

## 2017-10-29 ENCOUNTER — Encounter: Payer: Self-pay | Admitting: Internal Medicine

## 2017-10-29 ENCOUNTER — Ambulatory Visit (INDEPENDENT_AMBULATORY_CARE_PROVIDER_SITE_OTHER): Payer: PPO | Admitting: *Deleted

## 2017-10-29 ENCOUNTER — Ambulatory Visit (INDEPENDENT_AMBULATORY_CARE_PROVIDER_SITE_OTHER): Payer: PPO | Admitting: Internal Medicine

## 2017-10-29 ENCOUNTER — Other Ambulatory Visit (INDEPENDENT_AMBULATORY_CARE_PROVIDER_SITE_OTHER): Payer: PPO

## 2017-10-29 VITALS — BP 148/84 | HR 74 | Temp 97.7°F | Resp 16 | Ht 68.0 in | Wt 201.0 lb

## 2017-10-29 VITALS — BP 148/84 | HR 74 | Temp 97.7°F | Resp 18 | Ht 68.0 in | Wt 201.0 lb

## 2017-10-29 DIAGNOSIS — E1151 Type 2 diabetes mellitus with diabetic peripheral angiopathy without gangrene: Secondary | ICD-10-CM

## 2017-10-29 DIAGNOSIS — I4891 Unspecified atrial fibrillation: Secondary | ICD-10-CM

## 2017-10-29 DIAGNOSIS — Z1211 Encounter for screening for malignant neoplasm of colon: Secondary | ICD-10-CM

## 2017-10-29 DIAGNOSIS — I10 Essential (primary) hypertension: Secondary | ICD-10-CM

## 2017-10-29 DIAGNOSIS — L309 Dermatitis, unspecified: Secondary | ICD-10-CM | POA: Diagnosis not present

## 2017-10-29 DIAGNOSIS — Z0000 Encounter for general adult medical examination without abnormal findings: Secondary | ICD-10-CM

## 2017-10-29 DIAGNOSIS — E7849 Other hyperlipidemia: Secondary | ICD-10-CM

## 2017-10-29 DIAGNOSIS — R7989 Other specified abnormal findings of blood chemistry: Secondary | ICD-10-CM | POA: Diagnosis not present

## 2017-10-29 DIAGNOSIS — I2510 Atherosclerotic heart disease of native coronary artery without angina pectoris: Secondary | ICD-10-CM | POA: Diagnosis not present

## 2017-10-29 LAB — COMPREHENSIVE METABOLIC PANEL
ALT: 17 U/L (ref 0–53)
AST: 18 U/L (ref 0–37)
Albumin: 4.2 g/dL (ref 3.5–5.2)
Alkaline Phosphatase: 60 U/L (ref 39–117)
BUN: 24 mg/dL — ABNORMAL HIGH (ref 6–23)
CO2: 26 mEq/L (ref 19–32)
Calcium: 9.9 mg/dL (ref 8.4–10.5)
Chloride: 104 mEq/L (ref 96–112)
Creatinine, Ser: 0.9 mg/dL (ref 0.40–1.50)
GFR: 87.49 mL/min (ref >60.00)
Glucose, Bld: 124 mg/dL — ABNORMAL HIGH (ref 70–99)
Potassium: 4.3 mEq/L (ref 3.5–5.1)
Sodium: 141 mEq/L (ref 135–145)
Total Bilirubin: 0.6 mg/dL (ref 0.2–1.2)
Total Protein: 7.8 g/dL (ref 6.0–8.3)

## 2017-10-29 LAB — CBC WITH DIFFERENTIAL/PLATELET
Basophils Absolute: 0.1 10*3/uL (ref 0.0–0.1)
Basophils Relative: 0.8 % (ref 0.0–3.0)
Eosinophils Absolute: 0.1 10*3/uL (ref 0.0–0.7)
Eosinophils Relative: 0.9 % (ref 0.0–5.0)
HCT: 40.6 % (ref 39.0–52.0)
Hemoglobin: 14.2 g/dL (ref 13.0–17.0)
Lymphocytes Relative: 33.6 % (ref 12.0–46.0)
Lymphs Abs: 2.4 10*3/uL (ref 0.7–4.0)
MCHC: 34.9 g/dL (ref 30.0–36.0)
MCV: 98 fl (ref 78.0–100.0)
Monocytes Absolute: 0.6 10*3/uL (ref 0.1–1.0)
Monocytes Relative: 8.9 % (ref 3.0–12.0)
Neutro Abs: 4 10*3/uL (ref 1.4–7.7)
Neutrophils Relative %: 55.8 % (ref 43.0–77.0)
Platelets: 219 10*3/uL (ref 150.0–400.0)
RBC: 4.14 Mil/uL — ABNORMAL LOW (ref 4.22–5.81)
RDW: 14.5 % (ref 11.5–15.5)
WBC: 7.2 10*3/uL (ref 4.0–10.5)

## 2017-10-29 LAB — LIPID PANEL
Cholesterol: 128 mg/dL (ref 0–200)
HDL: 33.2 mg/dL — ABNORMAL LOW (ref >39.00)
LDL Cholesterol: 67 mg/dL (ref 0–99)
NonHDL: 94.61
Total CHOL/HDL Ratio: 4
Triglycerides: 140 mg/dL (ref 0.0–149.0)
VLDL: 28 mg/dL (ref 0.0–40.0)

## 2017-10-29 LAB — MICROALBUMIN / CREATININE URINE RATIO
Creatinine,U: 139.8 mg/dL
Microalb Creat Ratio: 1.2 mg/g (ref 0.0–30.0)
Microalb, Ur: 1.6 mg/dL (ref 0.0–1.9)

## 2017-10-29 LAB — T4, FREE: Free T4: 0.92 ng/dL (ref 0.60–1.60)

## 2017-10-29 LAB — HEMOGLOBIN A1C: Hgb A1c MFr Bld: 8.5 % — ABNORMAL HIGH (ref 4.6–6.5)

## 2017-10-29 LAB — TSH: TSH: 3.43 u[IU]/mL (ref 0.35–4.50)

## 2017-10-29 NOTE — Assessment & Plan Note (Signed)
Has seen derm Triamcinolone prn

## 2017-10-29 NOTE — Assessment & Plan Note (Signed)
Check lipid panel  Continue daily statin Regular exercise and healthy diet encouraged  

## 2017-10-29 NOTE — Assessment & Plan Note (Signed)
Check a1c, urine micro Low sugar / carb diet Stressed regular exercise, weight loss  

## 2017-10-29 NOTE — Assessment & Plan Note (Signed)
No chest pain, palps, sob Continue current medication Encouraged regular exercise

## 2017-10-29 NOTE — Assessment & Plan Note (Signed)
Asymptomatic  Rate controlled On xarelto Check cbc, cmp

## 2017-10-29 NOTE — Patient Instructions (Signed)
Continue doing brain stimulating activities (puzzles, reading, adult coloring books, staying active) to keep memory sharp.   Continue to eat heart healthy diet (full of fruits, vegetables, whole grains, lean protein, water--limit salt, fat, and sugar intake) and increase physical activity as tolerated.   Todd [name] , Thank you for taking time to come for your Medicare Wellness Visit. I appreciate your ongoing commitment to your health goals. Please review the following plan we discussed and let me know if I can assist you in the future.   These are the goals we discussed: Goals      Patient Stated   . <enter goal here> (pt-stated)     Hx of exercise in gym; Walks out in yard Goal weight is 190;  Continue goal of no white; plant fat      Other   . Patient Stated     I would like to get back to going to the gym. I will just go to the gym 3 times a week.       This is a list of the screening recommended for you and due dates:  Health Maintenance  Topic Date Due  . Complete foot exam   10/25/2017  . Urine Protein Check  10/25/2017  . Hemoglobin A1C  10/29/2017  . Colon Cancer Screening  11/11/2017  . Eye exam for diabetics  11/30/2017  . Tetanus Vaccine  12/04/2022  . Flu Shot  Completed  . Pneumonia vaccines  Completed

## 2017-10-29 NOTE — Assessment & Plan Note (Signed)
BP well controlled at home 130's/78ish Current regimen effective and well tolerated Continue current medications at current doses cmp

## 2017-10-29 NOTE — Patient Instructions (Signed)

## 2017-10-29 NOTE — Assessment & Plan Note (Signed)
Check tfts, thyroid ab

## 2017-10-30 LAB — THYROID ANTIBODIES
Thyroglobulin Ab: <1 IU/mL (ref <=1)
Thyroperoxidase Ab SerPl-aCnc: <1 IU/mL (ref <9)

## 2017-11-01 ENCOUNTER — Encounter: Payer: Self-pay | Admitting: Internal Medicine

## 2017-11-02 ENCOUNTER — Telehealth: Payer: Self-pay | Admitting: Internal Medicine

## 2017-11-02 NOTE — Telephone Encounter (Signed)
lvm for [name] (pt wife) to call back.

## 2017-11-02 NOTE — Telephone Encounter (Signed)
Copied from [place] [id number]. Topic: Quick Communication - See Telephone Encounter >> Nov 02, 2017  1:58 PM Aurelio Brash B wrote: CRM for notification. See Telephone encounter for:  PTs wife would like Dr Eilleen Kempf nurse to call her to discuss DM medications jardiance and farxiga 11/02/17.

## 2017-11-06 ENCOUNTER — Encounter: Payer: Self-pay | Admitting: Internal Medicine

## 2017-11-06 NOTE — Telephone Encounter (Signed)
His sugars are too high at home.  We can try Onglyza a pill you take once a day-I am unsure of the cost.  We could also try an injection once a week-Trulicity, Bydureon or ozempic are examples.  I do not know the cost until we sent them to the pharmacy.  He does need something other than what he is on.

## 2017-11-06 NOTE — Telephone Encounter (Signed)
Spoke with pts wife, Vania Rea is expensive and the [place] is not covered.  Blood sugar- today AM 130,  11/05/17 170am, 157 pm 11/04/17 143am, 150 pm 11/03/17 150am,. 122 pm 11/02/17 170am, 158 pm

## 2017-11-07 MED ORDER — SAXAGLIPTIN HCL 5 MG PO TABS: 5.0000 mg | ORAL_TABLET | Freq: Every day | ORAL | 5 refills | Status: DC

## 2017-11-07 NOTE — Telephone Encounter (Signed)
Spoke with pts wife, she would like the Onglyza sent to local pharmacy for a 30 day supply to see what the cost will be before sending to mail order.

## 2017-11-12 ENCOUNTER — Telehealth: Payer: Self-pay | Admitting: Emergency Medicine

## 2017-11-12 MED ORDER — SITAGLIPTIN PHOSPHATE 100 MG PO TABS: 100.0000 mg | ORAL_TABLET | Freq: Every day | ORAL | 1 refills | Status: DC

## 2017-11-12 NOTE — Telephone Encounter (Signed)
januvia sent to [facility]

## 2017-11-12 NOTE — Telephone Encounter (Signed)
Spoke with pts wife, advised that a PA was completed for Onglyza. Will cost $90 for 30 day supply. [place] would be an alternative. Please advise

## 2017-11-12 NOTE — Telephone Encounter (Signed)
PA has been approved through 08/20/18. Copay will be $90 at [facility] drug for 30 day supply.

## 2017-11-12 NOTE — Telephone Encounter (Addendum)
PA completed for Onglyza, awaiting response. Key [id number]

## 2017-11-12 NOTE — Telephone Encounter (Signed)
Copied from [place] [id number]. Topic: Quick Communication - See Telephone Encounter >> Nov 02, 2017  1:58 PM Aurelio Brash B wrote: CRM for notification. See Telephone encounter for:  PTs wife would like Dr Quay Burow nurse to call her to discuss DM medications jardiance and farxiga 11/02/17. >> Nov 12, 2017  4:02 PM Burnis Medin, NT wrote: Patient wife called back and wanted to see if the doctor could give her a call regarding patient's medications. Wife can be reached at [phone]

## 2017-11-13 DIAGNOSIS — G245 Blepharospasm: Secondary | ICD-10-CM | POA: Diagnosis not present

## 2017-11-13 NOTE — Telephone Encounter (Signed)
Spoke with pts wife to advise alternative has been sent to the pharmacy. She will contact us back if the copay is too expensive.

## 2017-11-14 DIAGNOSIS — M1611 Unilateral primary osteoarthritis, right hip: Secondary | ICD-10-CM | POA: Diagnosis not present

## 2017-11-15 DIAGNOSIS — M1611 Unilateral primary osteoarthritis, right hip: Secondary | ICD-10-CM | POA: Diagnosis not present

## 2017-11-27 ENCOUNTER — Other Ambulatory Visit: Payer: Self-pay | Admitting: Cardiology

## 2017-12-19 DIAGNOSIS — L308 Other specified dermatitis: Secondary | ICD-10-CM | POA: Diagnosis not present

## 2017-12-19 DIAGNOSIS — L820 Inflamed seborrheic keratosis: Secondary | ICD-10-CM | POA: Diagnosis not present

## 2017-12-24 DIAGNOSIS — L258 Unspecified contact dermatitis due to other agents: Secondary | ICD-10-CM | POA: Diagnosis not present

## 2017-12-26 ENCOUNTER — Other Ambulatory Visit: Payer: Self-pay | Admitting: Emergency Medicine

## 2017-12-26 NOTE — Telephone Encounter (Signed)
Received call from Pts wife. States she spoke to a friend that has diabetes and was told that she stopped taking Metformin to start Invokana and felt better on the medication. Would like to know if patient could stop one of the medications hes on and start Invokana. Please advise if they is something he can do.

## 2017-12-26 NOTE — Telephone Encounter (Signed)
We can try the invokana - it is pending.     He needs at least two medication at this time to control his sugars.  The more he exercises, weight he loses and compliance with a low sugar/carb diet the less medication he will need.   I would recommend continuing metformin unless he is having side effects.  We can discontinue one or both of his other meds but he has to do more with lifestyle.   Let me know  Have him f/u with me in 3 months - we can recheck his blood work and re-evaluate.   invokana pending.

## 2017-12-27 NOTE — Telephone Encounter (Signed)
Spoke with pts wife. Advised her on the note. States they will wait until office visit in Sept to check A1C again before changing medication.

## 2018-01-09 ENCOUNTER — Ambulatory Visit (INDEPENDENT_AMBULATORY_CARE_PROVIDER_SITE_OTHER)
Admission: RE | Admit: 2018-01-09 | Discharge: 2018-01-09 | Disposition: A | Discharge status: Home / Self Care | Payer: PPO | Source: Ambulatory Visit | Attending: Pulmonary Disease | Admitting: Pulmonary Disease

## 2018-01-09 DIAGNOSIS — J8410 Pulmonary fibrosis, unspecified: Secondary | ICD-10-CM

## 2018-01-15 ENCOUNTER — Encounter: Payer: Self-pay | Admitting: Pulmonary Disease

## 2018-01-15 ENCOUNTER — Ambulatory Visit (INDEPENDENT_AMBULATORY_CARE_PROVIDER_SITE_OTHER): Payer: PPO | Admitting: Pulmonary Disease

## 2018-01-15 ENCOUNTER — Ambulatory Visit: Payer: PPO | Admitting: Pulmonary Disease

## 2018-01-15 VITALS — BP 142/70 | HR 75 | Ht 67.5 in | Wt 196.6 lb

## 2018-01-15 DIAGNOSIS — R0602 Shortness of breath: Secondary | ICD-10-CM

## 2018-01-15 DIAGNOSIS — J8410 Pulmonary fibrosis, unspecified: Secondary | ICD-10-CM

## 2018-01-15 DIAGNOSIS — J849 Interstitial pulmonary disease, unspecified: Secondary | ICD-10-CM

## 2018-01-15 LAB — PULMONARY FUNCTION TEST
DL/VA % pred: 84 %
DL/VA: 3.69 ml/min/mmHg/L
DLCO unc % pred: 46 %
DLCO unc: 13.13 ml/min/mmHg
FEF 25-75 Post: 3.26 L/sec
FEF 25-75 Pre: 2.46 L/sec
FEF2575-%Change-Post: 32 %
FEF2575-%Pred-Post: 167 %
FEF2575-%Pred-Pre: 126 %
FEV1-%Change-Post: 7 %
FEV1-%Pred-Post: 86 %
FEV1-%Pred-Pre: 80 %
FEV1-Post: 2.33 L
FEV1-Pre: 2.18 L
FEV1FVC-%Change-Post: 3 %
FEV1FVC-%Pred-Pre: 112 %
FEV6-%Change-Post: 3 %
FEV6-%Pred-Post: 78 %
FEV6-%Pred-Pre: 76 %
FEV6-Post: 2.74 L
FEV6-Pre: 2.66 L
FEV6FVC-%Pred-Post: 107 %
FEV6FVC-%Pred-Pre: 107 %
FVC-%Change-Post: 3 %
FVC-%Pred-Post: 73 %
FVC-%Pred-Pre: 71 %
FVC-Post: 2.74 L
FVC-Pre: 2.66 L
Post FEV1/FVC ratio: 85 %
Post FEV6/FVC ratio: 100 %
Pre FEV1/FVC ratio: 82 %
Pre FEV6/FVC Ratio: 100 %
RV % pred: 68 %
RV: 1.64 L
TLC % pred: 71 %
TLC: 4.61 L

## 2018-01-15 NOTE — Progress Notes (Signed)
PFT done today. 

## 2018-01-15 NOTE — Progress Notes (Signed)
Clements Toro Platz    [id number]    [birth year]  Primary Care Physician:Burns, [name], MD  Referring Physician: Binnie Rail, MD [place], [place] [address]  Chief complaint:   Follow up for cough Pulmonary fibrosis, Asbestosis  HPI: Mr. Gadson is a 77 year old with history of allergies, rhinitis, atrial fibrillation, hypertension, hyperlipidemia, diabetes. He had a confirmed episode of influenza in February and was sent for 2 weeks. He has a lingering nonproductive cough associated with occasional wheezing. He denies any dyspnea, sputum production, fevers, chills. He had been given a prednisone taper last month without any improvement in symptoms. He also had a chest x-ray which showed bibasilar fibrosis and he has been referred here for further evaluation  Pets: None Occupation: Retired, used to work in [employer] Exposures: Significant exposure to asbestos in previous line of work Smoking history: None  Interim history States that breathing is doing well with no change.  He has mild dyspnea on exertion, cough which is nonproductive.  Outpatient Encounter Medications as of 01/15/2018  Medication Sig  . cetirizine (ZYRTEC) 10 MG tablet Take 10 mg by mouth daily as needed for allergies.   . Cholecalciferol (VITAMIN D-3) 5000 UNITS TABS Take 5,000 Units by mouth daily.   Marland Kitchen glimepiride (AMARYL) 2 MG tablet Take 1 tablet by mouth daily before breakfast  . glucose 4 GM chewable tablet Chew 1 tablet by mouth as needed for low blood sugar.  Marland Kitchen glucose blood (ONE TOUCH ULTRA TEST) test strip Test blood sugar once daily  . metFORMIN (GLUCOPHAGE-XR) 500 MG 24 hr tablet Take 2 tablets (1,000mg  total) by mouth twice a day  . metoprolol tartrate (LOPRESSOR) 50 MG tablet Take 1 tablet (50 mg total) by mouth 2 (two) times daily.  . nitroGLYCERIN (NITROSTAT) 0.4 MG SL tablet Place 1 tablet (0.4 mg total) under the tongue every 5 (five) minutes as needed for chest pain.  Marland Kitchen omeprazole  (PRILOSEC) 20 MG capsule Take 1 capsule by mouth daily (one time refill, primary need to continue)  . ONETOUCH DELICA LANCETS 31S MISC TEST BLOOD SUGAR ONCE DAILY  . polycarbophil (FIBERCON) 625 MG tablet Take 625 mg by mouth 2 (two) times daily.   . simvastatin (ZOCOR) 40 MG tablet Take 1 tablet (40 mg total) by mouth every evening.  . sitaGLIPtin (JANUVIA) 100 MG tablet Take 1 tablet (100 mg total) by mouth daily.  Marland Kitchen triamcinolone cream (KENALOG) 0.1 %   . XARELTO 20 MG TABS tablet Take 1 tablet by mouth daily with supper.   No facility-administered encounter medications on file as of 01/15/2018.     Allergies as of 01/15/2018 - Review Complete 01/15/2018  Allergen Reaction Noted  . Amlodipine besylate    . Ivp dye [iodinated diagnostic agents]      Past Medical History:  Diagnosis Date  . Allergic rhinitis   . Asthma   . Atrial fibrillation with rapid ventricular response ([facility])    a. newly diagnosed 11/03/13, spont conv to NSR, placed on xarelto.  Marland Kitchen CAD (coronary artery disease)    a. 1999; s/p CABG: LIMA to LAD, SVG to OM1, SVG to OM2, SVG to RCA  b. Normal nuc 10/2013 (done because of new onset AF.)  . Diabetes mellitus ([facility])   . HLD (hyperlipidemia)   . HTN (hypertension)   . Nephrolithiasis   . Overweight(278.02)   . PVD (peripheral vascular disease) ([facility])    Carotid stenosis  . Stroke (  [facility])     Past Surgical History:  Procedure Laterality Date  . CAROTID ENDARTERECTOMY  1999  . COLONOSCOPY  2014   negative;Dr Sharlett Iles  . COLONOSCOPY W/ POLYPECTOMY  2009   Dr Sharlett Iles  . CORONARY ARTERY BYPASS GRAFT  Aug.1999  . HEMORRHOID BANDING    . INTRACAPSULAR CATARACT EXTRACTION Bilateral 2018  . NASAL SINUS SURGERY      Family History  Problem Relation Age of Onset  . Prostate cancer Brother   . Hemochromatosis Brother   . Colon cancer Brother 40  . Heart attack Brother 34  . Asthma Sister   . Heart attack Mother 47  . Heart attack Brother 31  . Diabetes Father          IDDM  . Stroke Father 25    Social History   Socioeconomic History  . Marital status: Married    Spouse name: [name]  . Number of children: 2  . Years of education: Not on file  . Highest education level: Not on file  Occupational History  . Occupation: Press photographer Manager-retired  Social Needs  . Financial resource strain: Not hard at all  . Food insecurity:    Worry: Never true    Inability: Never true  . Transportation needs:    Medical: No    Non-medical: No  Tobacco Use  . Smoking status: Never Smoker  . Smokeless tobacco: Never Used  Substance and Sexual Activity  . Alcohol use: No    Alcohol/week: 0.0 oz  . Drug use: No  . Sexual activity: Yes  Lifestyle  . Physical activity:    Days per week: 3 days    Minutes per session: 50 min  . Stress: Not at all  Relationships  . Social connections:    Talks on phone: More than three times a week    Gets together: More than three times a week    Attends religious service: More than 4 times per year    Active member of club or organization: Yes    Attends meetings of clubs or organizations: More than 4 times per year    Relationship status: Married  . Intimate partner violence:    Fear of current or ex partner: No    Emotionally abused: No    Physically abused: No    Forced sexual activity: No  Other Topics Concern  . Not on file  Social History Narrative  . Not on file   Review of systems: Review of Systems  Constitutional: Negative for fever and chills.  HENT: Negative.   Eyes: Negative for blurred vision.  Respiratory: as per HPI  Cardiovascular: Negative for chest pain and palpitations.  Gastrointestinal: Negative for vomiting, diarrhea, blood per rectum. Genitourinary: Negative for dysuria, urgency, frequency and hematuria.  Musculoskeletal: Negative for myalgias, back pain and joint pain.  Skin: Negative for itching and rash.  Neurological: Negative for dizziness, tremors, focal weakness, seizures and  loss of consciousness.  Endo/Heme/Allergies: Negative for environmental allergies.  Psychiatric/Behavioral: Negative for depression, suicidal ideas and hallucinations.  All other systems reviewed and are negative.  Physical Exam: Blood pressure (!) 142/70, pulse 75, height 5' 7.5" (1.715 m), weight 196 lb 9.6 oz (89.2 kg), SpO2 100 %. Gen:      No acute distress HEENT:  EOMI, sclera anicteric Neck:     No masses; no thyromegaly Lungs:    Mild bibasilar crackles. CV:         Regular rate and rhythm; no murmurs Abd:      +  bowel sounds; soft, non-tender; no palpable masses, no distension Ext:    No edema; adequate peripheral perfusion Skin:      Warm and dry; no rash Neuro: alert and oriented x 3 Psych: normal mood and affect  Data Reviewed: Chest x-ray 12/04/16-stable interstitial opacities CT abdomen 11/20/13-mild reticular opacities at the bases CT high-resolution 01/02/17- subpleural reticulation, mild traction bronchiectasis, possible early honeycombing with basal gradient. 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  Unchanged since 2018. I have reviewed all the images personally  FENO 12/18/16- 12  PFTs  03/20/17- FVC 2.74 (72%], FEV1 2.38 (87%), F/F 87, TLC 62%, DLCO 56% Moderate restriction with severe diffusion defect.  01/15/2018-FVC 2.74 [73%], FEV1 2.33 [86%], F/F 85, TLC 71%, DLCO 46% Moderate restriction with severe diffusion defect  ILD serologies 01/09/17- all negative except for mild elevation in SCL 75.  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  Given his significant asbestos exposure this likely presents asbestosis. However there is a possibility that this could be idiopathic pulmonary fibrosis.  I have reviewed his most recent scan and PFTs which shows stability.  Follow-up in 6 months.  Check 6-minute walk test, repeat spirometry, diffusion capacity. If there is progression of disease then we will  discuss starting antifibrotic therapy.  Health maintenance 05/30/2017-influenza 01/15/2015-Prevnar 13 05/05/2010-Pneumovax  Plan/Recommendations: - 6-minute walk test - Repeat spirometry, diffusion capacity in 6 months  Marshell Garfinkel MD [facility] 01/15/2018, 10:19 AM  CC: Binnie Rail, MD

## 2018-01-15 NOTE — Patient Instructions (Signed)
Schedule you for a 6-minute walk test and spirometry, diffusion capacity in 6 months Follow-up in clinic after these tests.

## 2018-01-21 ENCOUNTER — Telehealth: Payer: Self-pay | Admitting: Internal Medicine

## 2018-01-21 NOTE — Telephone Encounter (Signed)
Copied from [facility] [id number]. Topic: Quick Communication - Rx Refill/Question >> Jan 21, 2018  3:56 PM Robina Ade, Helene Kelp D wrote: Medication: metFORMIN (GLUCOPHAGE-XR) 500 MG 24 hr tablet,glimepiride (AMARYL) 2 MG tablet,ONETOUCH DELICA LANCETS [id number] MISC  Has the patient contacted their pharmacy?Yes (Agent: If no, request that the patient contact the pharmacy for the refill.) (Agent: If yes, when and what did the pharmacy advise?)  Preferred Pharmacy (with phone number or street name): [facility] [place], [place]  Agent: Please be advised that RX refills may take up to 3 business days. We ask that you follow-up with your pharmacy.

## 2018-01-23 MED ORDER — ONETOUCH DELICA LANCETS 33G MISC: 3 refills | Status: DC

## 2018-01-23 MED ORDER — METFORMIN HCL ER 500 MG PO TB24: ORAL_TABLET | ORAL | 0 refills | Status: DC

## 2018-01-23 MED ORDER — GLIMEPIRIDE 2 MG PO TABS: ORAL_TABLET | ORAL | 0 refills | Status: DC

## 2018-01-23 NOTE — Telephone Encounter (Signed)
Refill request for the following medications:  LOV:   03/11/119 with Stacy Burns/ [facility]   Last refill:  Metformin :  10/17/17                  Amaryl 2 mg 10/17/17                  One touch lancets.  :  12/04/17   Pharmacy:   [facility]

## 2018-01-28 ENCOUNTER — Other Ambulatory Visit: Payer: Self-pay | Admitting: Internal Medicine

## 2018-02-01 ENCOUNTER — Other Ambulatory Visit: Payer: Self-pay | Admitting: Cardiology

## 2018-02-01 NOTE — Telephone Encounter (Signed)
Rx request sent to pharmacy.  

## 2018-03-06 ENCOUNTER — Telehealth: Payer: Self-pay | Admitting: Internal Medicine

## 2018-03-06 MED ORDER — METFORMIN HCL ER 500 MG PO TB24: 1000.0000 mg | ORAL_TABLET | Freq: Two times a day (BID) | ORAL | 0 refills | Status: DC

## 2018-03-06 NOTE — Telephone Encounter (Signed)
Keene Breath 03/06/2018 02:26 PM  Summary: Request for 30 day supply of medication    Patient's wife called to request a 30 day supply of medication, because they will be going out of town over the weekend and the mail order prescription will not reach them before they leave.  Please advise.  CB# [phone].

## 2018-03-06 NOTE — Addendum Note (Signed)
Addended by: Terence Lux B on: 03/06/2018 03:12 PM   Modules accepted: Orders

## 2018-03-06 NOTE — Telephone Encounter (Signed)
LVM informing pt I have sent RX to local pharmacy.

## 2018-04-15 ENCOUNTER — Other Ambulatory Visit: Payer: Self-pay | Admitting: Internal Medicine

## 2018-04-24 ENCOUNTER — Other Ambulatory Visit: Payer: Self-pay | Admitting: Internal Medicine

## 2018-05-01 NOTE — Patient Instructions (Addendum)
  Test(s) ordered today. Your results will be released to MyChart (or called to you) after review, usually within 72hours after test completion. If any changes need to be made, you will be notified at that same time.  Flu immunization administered today.    Medications reviewed and updated.  No changes recommended at this time.    Please followup in 6months   

## 2018-05-01 NOTE — Progress Notes (Signed)
Subjective:    Patient ID: Todd [name], male    DOB: [birth year], 77 y.o.   MRN: [id number]  HPI The patient is here for follow up.  CAD, Hypertension, Afib: He is taking his medication daily. He is compliant with a low sodium diet.  He does have some mild leg swelling on daily basis.  He does not elevate his legs and often has them hanging down for hours.  He denies chest pain, palpitations, shortness of breath and regular headaches. He is exercising minimally-he walks about once a week, but is active doing yard work..  He does not monitor his blood pressure at home.    Diabetes: He is taking his medication daily as prescribed. He is compliant with a diabetic diet. He is exercising minimally. He monitors his sugars and they have been running < 130. He checks his feet daily and denies foot lesions. He is up-to-date with an ophthalmology examination and has an appointment coming up next month..   Hyperlipidemia: He is taking his medication daily. He is compliant with a low fat/cholesterol diet. He is exercising minimally. He denies myalgias.    Medications and allergies reviewed with patient and updated if appropriate.  Patient Active Problem List   Diagnosis Date Noted  . Eczema 10/29/2017  . Hives 08/13/2017  . Cough 12/04/2016  . 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 Hemorrhoids, internal, with bleeding 01/15/2015  . Bursitis of left shoulder 01/14/2015  . Type 2 diabetes, controlled, with peripheral circulatory disorder ([facility]) 12/05/2013  . PVD (peripheral vascular disease) ([facility]) 11/12/2013  . Atrial fibrillation with RVR ([facility]) 11/03/2013  . Carotid artery disease ([facility]) 12/20/2009  . OVERWEIGHT/OBESITY 11/17/2008  . Hyperlipidemia 07/20/2008  . Essential hypertension 07/20/2008  . Coronary atherosclerosis 07/20/2008  . ALLERGIC RHINITIS 07/20/2008  . NEPHROLITHIASIS, HX OF 07/20/2008    Current Outpatient Medications on File Prior to Visit  Medication Sig Dispense Refill  .  cetirizine (ZYRTEC) 10 MG tablet Take 10 mg by mouth daily as needed for allergies.     . Cholecalciferol (VITAMIN D-3) 5000 UNITS TABS Take 5,000 Units by mouth daily.     Marland Kitchen glimepiride (AMARYL) 2 MG tablet Take 1 tablet by mouth daily before breakfast 90 tablet 1  . glucose 4 GM chewable tablet Chew 1 tablet by mouth as needed for low blood sugar.    Marland Kitchen glucose blood (ONE TOUCH ULTRA TEST) test strip Test blood sugar once daily 100 each 1  . metFORMIN (GLUCOPHAGE-XR) 500 MG 24 hr tablet Take 2 tablets (1,000 mg total) by mouth 2 (two) times daily with a meal. 120 tablet 0  . metoprolol tartrate (LOPRESSOR) 50 MG tablet Take 1 tablet (50 mg total) by mouth 2 (two) times daily. 180 tablet 3  . nitroGLYCERIN (NITROSTAT) 0.4 MG SL tablet Place 1 tablet (0.4 mg total) under the tongue every 5 (five) minutes as needed for chest pain. 100 tablet 3  . omeprazole (PRILOSEC) 20 MG capsule Take 1 capsule by mouth daily (one time refill, primary need to continue) 90 capsule 1  . ONETOUCH DELICA LANCETS 67E MISC TEST BLOOD SUGAR ONCE DAILY 100 each 3  . polycarbophil (FIBERCON) 625 MG tablet Take 625 mg by mouth 2 (two) times daily.     . simvastatin (ZOCOR) 40 MG tablet Take 1 tablet (40 mg total) by mouth every evening. 90 tablet 3  . sitaGLIPtin (JANUVIA) 100 MG tablet Take 1 tablet (100 mg total) by mouth daily. 90 tablet 1  .  triamcinolone cream (KENALOG) 0.1 %   3  . XARELTO 20 MG TABS tablet Take 1 tablet by mouth daily with supper. 90 tablet 3   No current facility-administered medications on file prior to visit.     Past Medical History:  Diagnosis Date  . Allergic rhinitis   . Asthma   . Atrial fibrillation with rapid ventricular response ([facility])    a. newly diagnosed 11/03/13, spont conv to NSR, placed on xarelto.  Marland Kitchen CAD (coronary artery disease)    a. 1999; s/p CABG: LIMA to LAD, SVG to OM1, SVG to OM2, SVG to RCA  b. Normal nuc 10/2013 (done because of new onset AF.)  . Diabetes mellitus  ([facility])   . HLD (hyperlipidemia)   . HTN (hypertension)   . Nephrolithiasis   . Overweight(278.02)   . PVD (peripheral vascular disease) ([facility])    Carotid stenosis  . Stroke [facility])     Past Surgical History:  Procedure Laterality Date  . CAROTID ENDARTERECTOMY  1999  . COLONOSCOPY  2014   negative;Dr Sharlett Iles  . COLONOSCOPY W/ POLYPECTOMY  2009   Dr Sharlett Iles  . CORONARY ARTERY BYPASS GRAFT  Aug.1999  . HEMORRHOID BANDING    . INTRACAPSULAR CATARACT EXTRACTION Bilateral 2018  . NASAL SINUS SURGERY      Social History   Socioeconomic History  . Marital status: Married    Spouse name: [name]  . Number of children: 2  . Years of education: Not on file  . Highest education level: Not on file  Occupational History  . Occupation: Press photographer Manager-retired  Social Needs  . Financial resource strain: Not hard at all  . Food insecurity:    Worry: Never true    Inability: Never true  . Transportation needs:    Medical: No    Non-medical: No  Tobacco Use  . Smoking status: Never Smoker  . Smokeless tobacco: Never Used  Substance and Sexual Activity  . Alcohol use: No    Alcohol/week: 0.0 standard drinks  . Drug use: No  . Sexual activity: Yes  Lifestyle  . Physical activity:    Days per week: 3 days    Minutes per session: 50 min  . Stress: Not at all  Relationships  . Social connections:    Talks on phone: More than three times a week    Gets together: More than three times a week    Attends religious service: More than 4 times per year    Active member of club or organization: Yes    Attends meetings of clubs or organizations: More than 4 times per year    Relationship status: Married  Other Topics Concern  . Not on file  Social History Narrative  . Not on file    Family History  Problem Relation Age of Onset  . Prostate cancer Brother   . Hemochromatosis Brother   . Colon cancer Brother 39  . Heart attack Brother 33  . Asthma Sister   . Heart attack Mother 23   . Heart attack Brother 65  . Diabetes Father        IDDM  . Stroke Father 29    Review of Systems  Constitutional: Negative for chills and fever.  Respiratory: Negative for cough, shortness of breath and wheezing.   Cardiovascular: Positive for leg swelling. Negative for chest pain and palpitations.  Neurological: Negative for light-headedness and headaches.       Objective:   Vitals:   05/02/18 0109  BP: 132/80  Pulse: 65  Resp: 16  Temp: 98.2 F (36.8 C)  SpO2: 96%   BP Readings from Last 3 Encounters:  05/02/18 132/80  01/15/18 (!) 142/70  10/29/17 (!) 148/84   Wt Readings from Last 3 Encounters:  05/02/18 198 lb (89.8 kg)  01/15/18 196 lb 9.6 oz (89.2 kg)  10/29/17 201 lb (91.2 kg)   Body mass index is 30.55 kg/m.   Physical Exam    Constitutional: Appears well-developed and well-nourished. No distress.  HENT:  Head: Normocephalic and atraumatic.  Neck: Neck supple. No tracheal deviation present. No thyromegaly present.  No cervical lymphadenopathy Cardiovascular: Normal rate, regular rhythm and normal heart sounds.   No murmur heard. No carotid bruit .  Trace b/l LE edema, varicose veins b/l ankles Pulmonary/Chest: Effort normal and breath sounds normal. No respiratory distress. No has no wheezes. No rales.  Skin: Skin is warm and dry. Not diaphoretic.  Psychiatric: Normal mood and affect. Behavior is normal.      Assessment & Plan:    See Problem List for Assessment and Plan of chronic medical problems.

## 2018-05-02 ENCOUNTER — Encounter: Payer: Self-pay | Admitting: Internal Medicine

## 2018-05-02 ENCOUNTER — Ambulatory Visit (INDEPENDENT_AMBULATORY_CARE_PROVIDER_SITE_OTHER): Payer: PPO | Admitting: Internal Medicine

## 2018-05-02 ENCOUNTER — Other Ambulatory Visit (INDEPENDENT_AMBULATORY_CARE_PROVIDER_SITE_OTHER): Payer: PPO

## 2018-05-02 VITALS — BP 132/80 | HR 65 | Temp 98.2°F | Resp 16 | Ht 67.5 in | Wt 198.0 lb

## 2018-05-02 DIAGNOSIS — I2510 Atherosclerotic heart disease of native coronary artery without angina pectoris: Secondary | ICD-10-CM | POA: Diagnosis not present

## 2018-05-02 DIAGNOSIS — I4891 Unspecified atrial fibrillation: Secondary | ICD-10-CM

## 2018-05-02 DIAGNOSIS — Z23 Encounter for immunization: Secondary | ICD-10-CM | POA: Diagnosis not present

## 2018-05-02 DIAGNOSIS — E1151 Type 2 diabetes mellitus with diabetic peripheral angiopathy without gangrene: Secondary | ICD-10-CM | POA: Diagnosis not present

## 2018-05-02 DIAGNOSIS — I10 Essential (primary) hypertension: Secondary | ICD-10-CM

## 2018-05-02 DIAGNOSIS — E7849 Other hyperlipidemia: Secondary | ICD-10-CM | POA: Diagnosis not present

## 2018-05-02 LAB — CBC WITH DIFFERENTIAL/PLATELET
Basophils Absolute: 0 10*3/uL (ref 0.0–0.1)
Basophils Relative: 0.3 % (ref 0.0–3.0)
Eosinophils Absolute: 0.1 10*3/uL (ref 0.0–0.7)
Eosinophils Relative: 1.2 % (ref 0.0–5.0)
HCT: 40.3 % (ref 39.0–52.0)
Hemoglobin: 14 g/dL (ref 13.0–17.0)
Lymphocytes Relative: 34.8 % (ref 12.0–46.0)
Lymphs Abs: 2.3 10*3/uL (ref 0.7–4.0)
MCHC: 34.7 g/dL (ref 30.0–36.0)
MCV: 98.9 fl (ref 78.0–100.0)
Monocytes Absolute: 0.6 10*3/uL (ref 0.1–1.0)
Monocytes Relative: 9.4 % (ref 3.0–12.0)
Neutro Abs: 3.6 10*3/uL (ref 1.4–7.7)
Neutrophils Relative %: 54.3 % (ref 43.0–77.0)
Platelets: 156 10*3/uL (ref 150.0–400.0)
RBC: 4.08 Mil/uL — ABNORMAL LOW (ref 4.22–5.81)
RDW: 14.4 % (ref 11.5–15.5)
WBC: 6.6 10*3/uL (ref 4.0–10.5)

## 2018-05-02 LAB — LIPID PANEL
Cholesterol: 115 mg/dL (ref 0–200)
HDL: 28.6 mg/dL — ABNORMAL LOW (ref >39.00)
LDL Cholesterol: 56 mg/dL (ref 0–99)
NonHDL: 86.38
Total CHOL/HDL Ratio: 4
Triglycerides: 151 mg/dL — ABNORMAL HIGH (ref 0.0–149.0)
VLDL: 30.2 mg/dL (ref 0.0–40.0)

## 2018-05-02 LAB — COMPREHENSIVE METABOLIC PANEL
ALT: 16 U/L (ref 0–53)
AST: 19 U/L (ref 0–37)
Albumin: 4.3 g/dL (ref 3.5–5.2)
Alkaline Phosphatase: 59 U/L (ref 39–117)
BUN: 27 mg/dL — ABNORMAL HIGH (ref 6–23)
CO2: 25 mEq/L (ref 19–32)
Calcium: 9.9 mg/dL (ref 8.4–10.5)
Chloride: 105 mEq/L (ref 96–112)
Creatinine, Ser: 1.15 mg/dL (ref 0.40–1.50)
GFR: 65.85 mL/min (ref >60.00)
Glucose, Bld: 108 mg/dL — ABNORMAL HIGH (ref 70–99)
Potassium: 4.2 mEq/L (ref 3.5–5.1)
Sodium: 140 mEq/L (ref 135–145)
Total Bilirubin: 0.7 mg/dL (ref 0.2–1.2)
Total Protein: 7.7 g/dL (ref 6.0–8.3)

## 2018-05-02 LAB — HEMOGLOBIN A1C: Hgb A1c MFr Bld: 6.8 % — ABNORMAL HIGH (ref 4.6–6.5)

## 2018-05-02 MED ORDER — BLOOD GLUCOSE MONITOR KIT: PACK | 0 refills | Status: DC

## 2018-05-02 NOTE — Assessment & Plan Note (Signed)
No chest pain, palpitations or shortness of breath Continue current medications Check CBC, CMP, lipid panel

## 2018-05-02 NOTE — Assessment & Plan Note (Signed)
Check lipid panel  Continue daily statin Regular exercise and healthy diet encouraged  

## 2018-05-02 NOTE — Assessment & Plan Note (Signed)
Check a1c Low sugar / carb diet Stressed regular exercise, weight loss  

## 2018-05-02 NOTE — Addendum Note (Signed)
Addended by: Delice Bison E on: 05/02/2018 12:50 PM   Modules accepted: Orders

## 2018-05-02 NOTE — Assessment & Plan Note (Signed)
BP well controlled Current regimen effective and well tolerated Continue current medications at current doses cmp  

## 2018-05-02 NOTE — Assessment & Plan Note (Signed)
No chest pain, palpitations or shortness of breath Rate controlled Continue current medications Check CBC, CMP, lipid panel

## 2018-06-05 DIAGNOSIS — H26491 Other secondary cataract, right eye: Secondary | ICD-10-CM | POA: Diagnosis not present

## 2018-07-02 ENCOUNTER — Other Ambulatory Visit: Payer: Self-pay | Admitting: Internal Medicine

## 2018-07-22 ENCOUNTER — Ambulatory Visit (INDEPENDENT_AMBULATORY_CARE_PROVIDER_SITE_OTHER): Payer: PPO | Admitting: *Deleted

## 2018-07-22 DIAGNOSIS — R0602 Shortness of breath: Secondary | ICD-10-CM | POA: Diagnosis not present

## 2018-07-22 NOTE — Progress Notes (Signed)
SIX MIN WALK 07/22/2018 03/20/2017  Medications Fibercon 625mg , Vitamin D3 125mg , Amaryl 2mg , Metformin 500mg , Lopressor 50mg , Prilosec 20mg  - taken at 0800 -  Supplimental Oxygen during Test? (L/min) No No  Laps 13 -  Partial Lap (in Meters) 2 -  Baseline BP (sitting) 138/80 -  Baseline Heartrate 72 -  Baseline Dyspnea (Borg Scale) 0.5 -  Baseline Fatigue (Borg Scale) 0 -  Baseline SPO2 97 -  BP (sitting) 144/82 -  Heartrate 94 -  Dyspnea (Borg Scale) 2 -  Fatigue (Borg Scale) 2 -  SPO2 94 -  BP (sitting) 142/82 -  Heartrate 77 -  SPO2 98 -  Stopped or Paused before Six Minutes No -  Interpretation (No Data) -  Distance Completed 444 -  Tech Comments: Total Meters Walked: 444 -

## 2018-07-23 ENCOUNTER — Ambulatory Visit (INDEPENDENT_AMBULATORY_CARE_PROVIDER_SITE_OTHER): Payer: PPO | Admitting: Pulmonary Disease

## 2018-07-23 ENCOUNTER — Encounter: Payer: Self-pay | Admitting: Pulmonary Disease

## 2018-07-23 ENCOUNTER — Ambulatory Visit: Payer: PPO | Admitting: Pulmonary Disease

## 2018-07-23 VITALS — BP 140/68 | HR 83 | Ht 67.0 in | Wt 203.0 lb

## 2018-07-23 DIAGNOSIS — J8410 Pulmonary fibrosis, unspecified: Secondary | ICD-10-CM

## 2018-07-23 DIAGNOSIS — J849 Interstitial pulmonary disease, unspecified: Secondary | ICD-10-CM

## 2018-07-23 DIAGNOSIS — R0602 Shortness of breath: Secondary | ICD-10-CM | POA: Diagnosis not present

## 2018-07-23 LAB — PULMONARY FUNCTION TEST
DL/VA % pred: 93 %
DL/VA: 4.09 ml/min/mmHg/L
DLCO unc % pred: 53 %
DLCO unc: 15.16 ml/min/mmHg
FEF 25-75 Pre: 2.5 L/sec
FEF2575-%Pred-Pre: 127 %
FEV1-%Pred-Pre: 78 %
FEV1-Pre: 2.1 L
FEV1FVC-%Pred-Pre: 115 %
FEV6-%Pred-Pre: 71 %
FEV6-Pre: 2.51 L
FEV6FVC-%Pred-Pre: 107 %
FVC-%Pred-Pre: 67 %
FVC-Pre: 2.51 L
Pre FEV1/FVC ratio: 84 %
Pre FEV6/FVC Ratio: 100 %

## 2018-07-23 NOTE — Progress Notes (Signed)
PFT completed today.  

## 2018-07-23 NOTE — Progress Notes (Signed)
Todd [name]    [id number]    1941/08/19  Primary Care Physician:Burns, [name], MD  Referring Physician: Binnie Rail, MD [place], [place] [id number]  Chief complaint:   Follow up for cough Pulmonary fibrosis, Asbestosis  HPI: [name] is a 77 year old with history of allergies, rhinitis, atrial fibrillation, hypertension, hyperlipidemia, diabetes. He had a confirmed episode of influenza in February and was sent for 2 weeks. He has a lingering nonproductive cough associated with occasional wheezing. He denies any dyspnea, sputum production, fevers, chills. He had been given a prednisone taper last month without any improvement in symptoms. He also had a chest x-ray which showed bibasilar fibrosis and he has been referred here for further evaluation  Pets: None Occupation: Retired, used to work in [employer] Exposures: Significant exposure to asbestos in previous line of work Smoking history: None  Interim history States his breathing is doing stable.  He remains active with walking and regular exercise. He has mild dyspnea on exertion.  No cough, sputum production, wheezing  Outpatient Encounter Medications as of 07/23/2018  Medication Sig  . blood glucose meter kit and supplies KIT Dispense based on patient and insurance preference. Use up to four times daily as directed. (FOR E11.9).  . cetirizine (ZYRTEC) 10 MG tablet Take 10 mg by mouth daily as needed for allergies.   . Cholecalciferol (VITAMIN D-3) 5000 UNITS TABS Take 5,000 Units by mouth daily.   Marland Kitchen glimepiride (AMARYL) 2 MG tablet Take 1 tablet by mouth daily before breakfast  . glucose 4 GM chewable tablet Chew 1 tablet by mouth as needed for low blood sugar.  Marland Kitchen glucose blood (ONE TOUCH ULTRA TEST) test strip Test blood sugar once daily  . metFORMIN (GLUCOPHAGE-XR) 500 MG 24 hr tablet Take 2 tablets (1,000 mg total) by mouth 2 (two) times daily with a meal.  . metFORMIN (GLUCOPHAGE-XR) 500 MG 24 hr tablet  Take 2 tablets (1,060m total) by mouth twice a day  . metoprolol tartrate (LOPRESSOR) 50 MG tablet Take 1 tablet (50 mg total) by mouth 2 (two) times daily.  . nitroGLYCERIN (NITROSTAT) 0.4 MG SL tablet Place 1 tablet (0.4 mg total) under the tongue every 5 (five) minutes as needed for chest pain.  .Marland Kitchenomeprazole (PRILOSEC) 20 MG capsule Take 1 capsule by mouth daily (one time refill, primary need to continue)  . ONETOUCH DELICA LANCETS 354GMISC TEST BLOOD SUGAR ONCE DAILY  . polycarbophil (FIBERCON) 625 MG tablet Take 625 mg by mouth 2 (two) times daily.   . simvastatin (ZOCOR) 40 MG tablet Take 1 tablet (40 mg total) by mouth every evening.  . sitaGLIPtin (JANUVIA) 100 MG tablet Take 1 tablet (100 mg total) by mouth daily.  .Marland Kitchentriamcinolone cream (KENALOG) 0.1 %   . XARELTO 20 MG TABS tablet Take 1 tablet by mouth daily with supper.   No facility-administered encounter medications on file as of 07/23/2018.    Physical Exam: Blood pressure 140/68, pulse 83, height 5' 7"  (1.702 m), weight 203 lb (92.1 kg), SpO2 99 %. Gen:      No acute distress HEENT:  EOMI, sclera anicteric Neck:     No masses; no thyromegaly Lungs:    Mild basal crackles. CV:         Regular rate and rhythm; no murmurs Abd:      + bowel sounds; soft, non-tender; no palpable masses, no distension Ext:    No edema;  adequate peripheral perfusion Skin:      Warm and dry; no rash Neuro: alert and oriented x 3 Psych: normal mood and affect  Data Reviewed: Imaging Chest x-ray 12/04/16-stable interstitial opacities CT abdomen 11/20/13-mild reticular opacities at the bases CT high-resolution 01/02/17- subpleural reticulation, mild traction bronchiectasis, possible early honeycombing with basal gradient. 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  Unchanged since 2018. I have reviewed all the images personally  PFTs  03/20/17- FVC 2.74 (72%], FEV1 2.38 (87%), F/F 87, TLC 62%, DLCO 56%   01/15/2018-FVC 2.74 [73%], FEV1  2.33 [86%], F/F 85, TLC 71%, DLCO 46%  07/23/2018-FVC 2.51 [69%), FEV1 2.10 [78%), DLCO 53% Mild restriction, moderate-severe diffusion defect.  FENO 12/18/16- 12  Labs ILD serologies 01/09/17- all negative except for mild elevation in SCL 75.  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  Given his significant asbestos exposure this likely presents asbestosis. However there is a possibility that this could be idiopathic pulmonary fibrosis.  I have reviewed his most recent scan and PFTs which shows stability of diffusion capacity although FVC is decreased  He could benefit from anti-fibrotic therapy.  We had an extensive discussion today about risk benefits of initiating therapy and he would like to defer for now. We will reevaluate in 6 months with high-res CT, spirometry and diffusion capacity and reevaluate.  Health maintenance 05/02/2018-influenza 01/15/2015-Prevnar 13 05/05/2010-Pneumovax  Plan/Recommendations: - High-res CT in 6 months - Repeat spirometry, diffusion capacity in 6 months  Marshell Garfinkel MD [facility] 07/23/2018, 9:44 AM  CC: Binnie Rail, MD

## 2018-07-23 NOTE — Patient Instructions (Signed)
I am glad you are doing well with regard to her breathing Continue to stay active with exercise We will schedule you for a high-resolution CT and a spirometry, diffusion capacity in 6 months.  Follow-up in clinic after these tests for review.

## 2018-07-24 ENCOUNTER — Other Ambulatory Visit: Payer: Self-pay | Admitting: Internal Medicine

## 2018-08-06 NOTE — Progress Notes (Signed)
HPI The patient presents for follow up of CAD and atrial fibrillation.  In March of 2015 he had a stress perfusion study demonstrating no evidence of ischemia or infarct with preserved ejection fraction.  Since I last saw him he is done well. The patient denies any new symptoms such as chest discomfort, neck or arm discomfort. There has been no new shortness of breath, PND or orthopnea. There have been no reported palpitations, presyncope or syncope.  He is active loading wood.  He has had some chronic shortness of breath and is being treated for interstitial lung disease but this is been baseline.   Allergies  Allergen Reactions  . Amlodipine Besylate     Rash Because of a history of documented adverse serious drug reaction;Medi Alert bracelet  is recommended  . Ivp Dye [Iodinated Diagnostic Agents]     Rash Because of a history of documented adverse serious drug reaction;Medi Alert bracelet  is recommended    Current Outpatient Medications  Medication Sig Dispense Refill  . blood glucose meter kit and supplies KIT Dispense based on patient and insurance preference. Use up to four times daily as directed. (FOR E11.9). 1 each 0  . cetirizine (ZYRTEC) 10 MG tablet Take 10 mg by mouth daily as needed for allergies.     . Cholecalciferol (VITAMIN D-3) 5000 UNITS TABS Take 5,000 Units by mouth daily.     Marland Kitchen glimepiride (AMARYL) 2 MG tablet Take 1 tablet by mouth daily before breakfast 90 tablet 1  . glucose 4 GM chewable tablet Chew 1 tablet by mouth as needed for low blood sugar.    Marland Kitchen glucose blood (ONE TOUCH ULTRA TEST) test strip Test blood sugar once daily 100 each 1  . metFORMIN (GLUCOPHAGE-XR) 500 MG 24 hr tablet Take 2 tablets (1,07m total) by mouth twice a day 360 tablet 1  . metoprolol tartrate (LOPRESSOR) 50 MG tablet Take 1 tablet (50 mg total) by mouth 2 (two) times daily. 180 tablet 3  . nitroGLYCERIN (NITROSTAT) 0.4 MG SL tablet Place 1 tablet (0.4 mg total) under the  tongue every 5 (five) minutes as needed for chest pain. 100 tablet 3  . omeprazole (PRILOSEC) 20 MG capsule Take 1 capsule by mouth daily (one time refill, primary need to continue) 90 capsule 1  . ONETOUCH DELICA LANCETS 391TMISC TEST BLOOD SUGAR ONCE DAILY 100 each 3  . polycarbophil (FIBERCON) 625 MG tablet Take 625 mg by mouth 2 (two) times daily.     . simvastatin (ZOCOR) 40 MG tablet Take 1 tablet (40 mg total) by mouth every evening. 90 tablet 3  . sitaGLIPtin (JANUVIA) 100 MG tablet Take 1 tablet (100 mg total) by mouth daily. 90 tablet 1  . triamcinolone cream (KENALOG) 0.1 %   3  . XARELTO 20 MG TABS tablet Take 1 tablet by mouth daily with supper. 90 tablet 3   No current facility-administered medications for this visit.     Past Medical History:  Diagnosis Date  . Allergic rhinitis   . Asthma   . Atrial fibrillation with rapid ventricular response ([place]    a. newly diagnosed 11/03/13, spont conv to NSR, placed on xarelto.  .Marland KitchenCAD (coronary artery disease)    a. 1999; s/p CABG: LIMA to LAD, SVG to OM1, SVG to OM2, SVG to RCA  b. Normal nuc 10/2013 (done because of new onset AF.)  . Diabetes mellitus ([facility]   . HLD (hyperlipidemia)   . HTN (hypertension)   .  Nephrolithiasis   . Overweight(278.02)   . PVD (peripheral vascular disease) (HCC)    Carotid stenosis  . Stroke [facility])     Past Surgical History:  Procedure Laterality Date  . CAROTID ENDARTERECTOMY  1999  . COLONOSCOPY  2014   negative;Dr Sharlett Iles  . COLONOSCOPY W/ POLYPECTOMY  2009   Dr Sharlett Iles  . CORONARY ARTERY BYPASS GRAFT  Aug.1999  . HEMORRHOID BANDING    . INTRACAPSULAR CATARACT EXTRACTION Bilateral 2018  . NASAL SINUS SURGERY      ROS:     As stated in the HPI and negative for all other systems.  PHYSICAL EXAM BP (!) 142/82   Pulse 72   Ht 5' 8"  (1.727 m)   Wt 201 lb 12.8 oz (91.5 kg)   BMI 30.68 kg/m   GENERAL:  Well appearing NECK:  No jugular venous distention, waveform within normal  limits, carotid upstroke brisk and symmetric, no bruits, no thyromegaly LUNGS:  Clear to auscultation bilaterally CHEST:  Unremarkable HEART:  PMI not displaced or sustained,S1 and S2 within normal limits, no S3, no S4, no clicks, no rubs, no murmurs ABD:  Flat, positive bowel sounds normal in frequency in pitch, no bruits, no rebound, no guarding, no midline pulsatile mass, no hepatomegaly, no splenomegaly EXT:  2 plus pulses throughout, no edema, no cyanosis no clubbing   Lab Results  Component Value Date   CHOL 115 05/02/2018   TRIG 151.0 (H) 05/02/2018   HDL 28.60 (L) 05/02/2018   LDLCALC 56 05/02/2018   Lab Results  Component Value Date   HGBA1C 6.8 (H) 05/02/2018    EKG:  Sinus rhythm, rate 72 axis within normal limits, intervals within normal limits, no acute ST-T wave changes.   08/08/2018  ASSESSMENT AND PLAN  ATRIAL FIB - He is not noticing any fibrillation.  He tolerates anticoagulation.Todd [name] has a CHA2DS2 - VASc score of 6 he will remain on anticoagulatoin.   CAD -  The patient has no new sypmtoms.  No further cardiovascular testing is indicated.  We will continue with aggressive risk reduction and meds as listed.  CAROTID ARTERY DISEASE -  He had mild plaque in Jan.  No further work up is indicated.    HYPERLIPIDEMIA -  LDL was 56 with an HDL of 28.  No change in therapy.  OVERWEIGHT/OBESITY -   He is gained a little weight back and we talked about this.   HYPERTENSION - The blood pressure is slightly elevated but it is 120s to 110s over 60s at home.  I will not make any change to his meds.   DM His A1c got up above 8 but it is now back down to 6.8.  He will continue with meds as listed.

## 2018-08-08 ENCOUNTER — Encounter: Payer: Self-pay | Admitting: Cardiology

## 2018-08-08 ENCOUNTER — Ambulatory Visit: Payer: PPO | Admitting: Cardiology

## 2018-08-08 VITALS — BP 142/82 | HR 72 | Ht 68.0 in | Wt 201.8 lb

## 2018-08-08 DIAGNOSIS — I2510 Atherosclerotic heart disease of native coronary artery without angina pectoris: Secondary | ICD-10-CM | POA: Diagnosis not present

## 2018-08-08 DIAGNOSIS — I480 Paroxysmal atrial fibrillation: Secondary | ICD-10-CM | POA: Diagnosis not present

## 2018-08-08 NOTE — Patient Instructions (Signed)

## 2018-08-26 ENCOUNTER — Other Ambulatory Visit: Payer: Self-pay | Admitting: *Deleted

## 2018-08-26 MED ORDER — OMEPRAZOLE 20 MG PO CPDR: 20.0000 mg | DELAYED_RELEASE_CAPSULE | Freq: Every day | ORAL | 3 refills | Status: DC

## 2018-08-26 MED ORDER — SIMVASTATIN 40 MG PO TABS: 40.0000 mg | ORAL_TABLET | Freq: Every evening | ORAL | 3 refills | Status: DC

## 2018-08-26 MED ORDER — METOPROLOL TARTRATE 50 MG PO TABS: 50.0000 mg | ORAL_TABLET | Freq: Two times a day (BID) | ORAL | 3 refills | Status: DC

## 2018-09-04 ENCOUNTER — Telehealth: Payer: Self-pay | Admitting: Cardiology

## 2018-09-04 MED ORDER — XARELTO 20 MG PO TABS: ORAL_TABLET | ORAL | 3 refills | Status: DC

## 2018-09-04 NOTE — Telephone Encounter (Signed)
Rx for Xarelto 20 mg sent to [facility] per fax request.

## 2018-09-10 IMAGING — DX DG CHEST 2V
2 series · 2 of 2 positions shown · non-contrast
Comparison: 10/20/2016 chest radiograph

CLINICAL DATA: 75 y/o  M; persistent dry cough.

EXAM:
CHEST  2 VIEW

[chest pa]
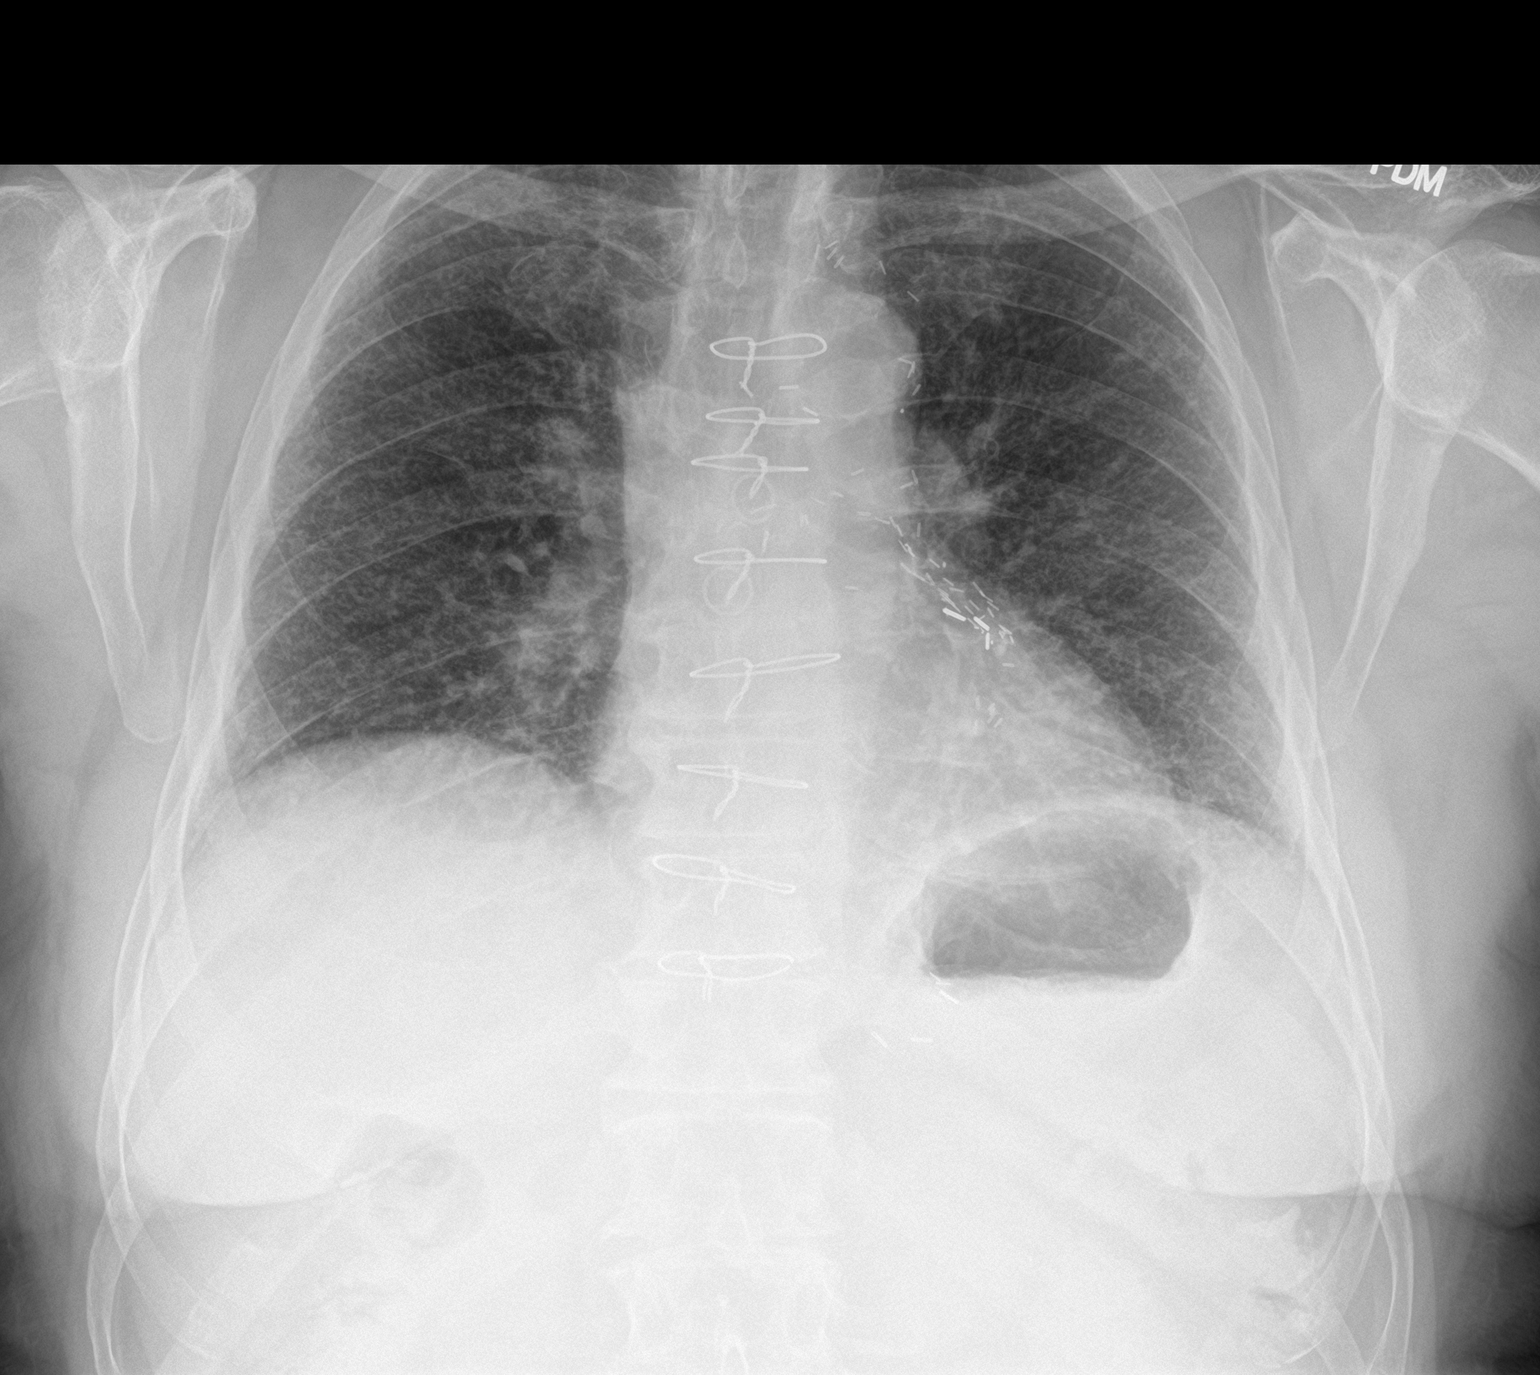

[chest lat]
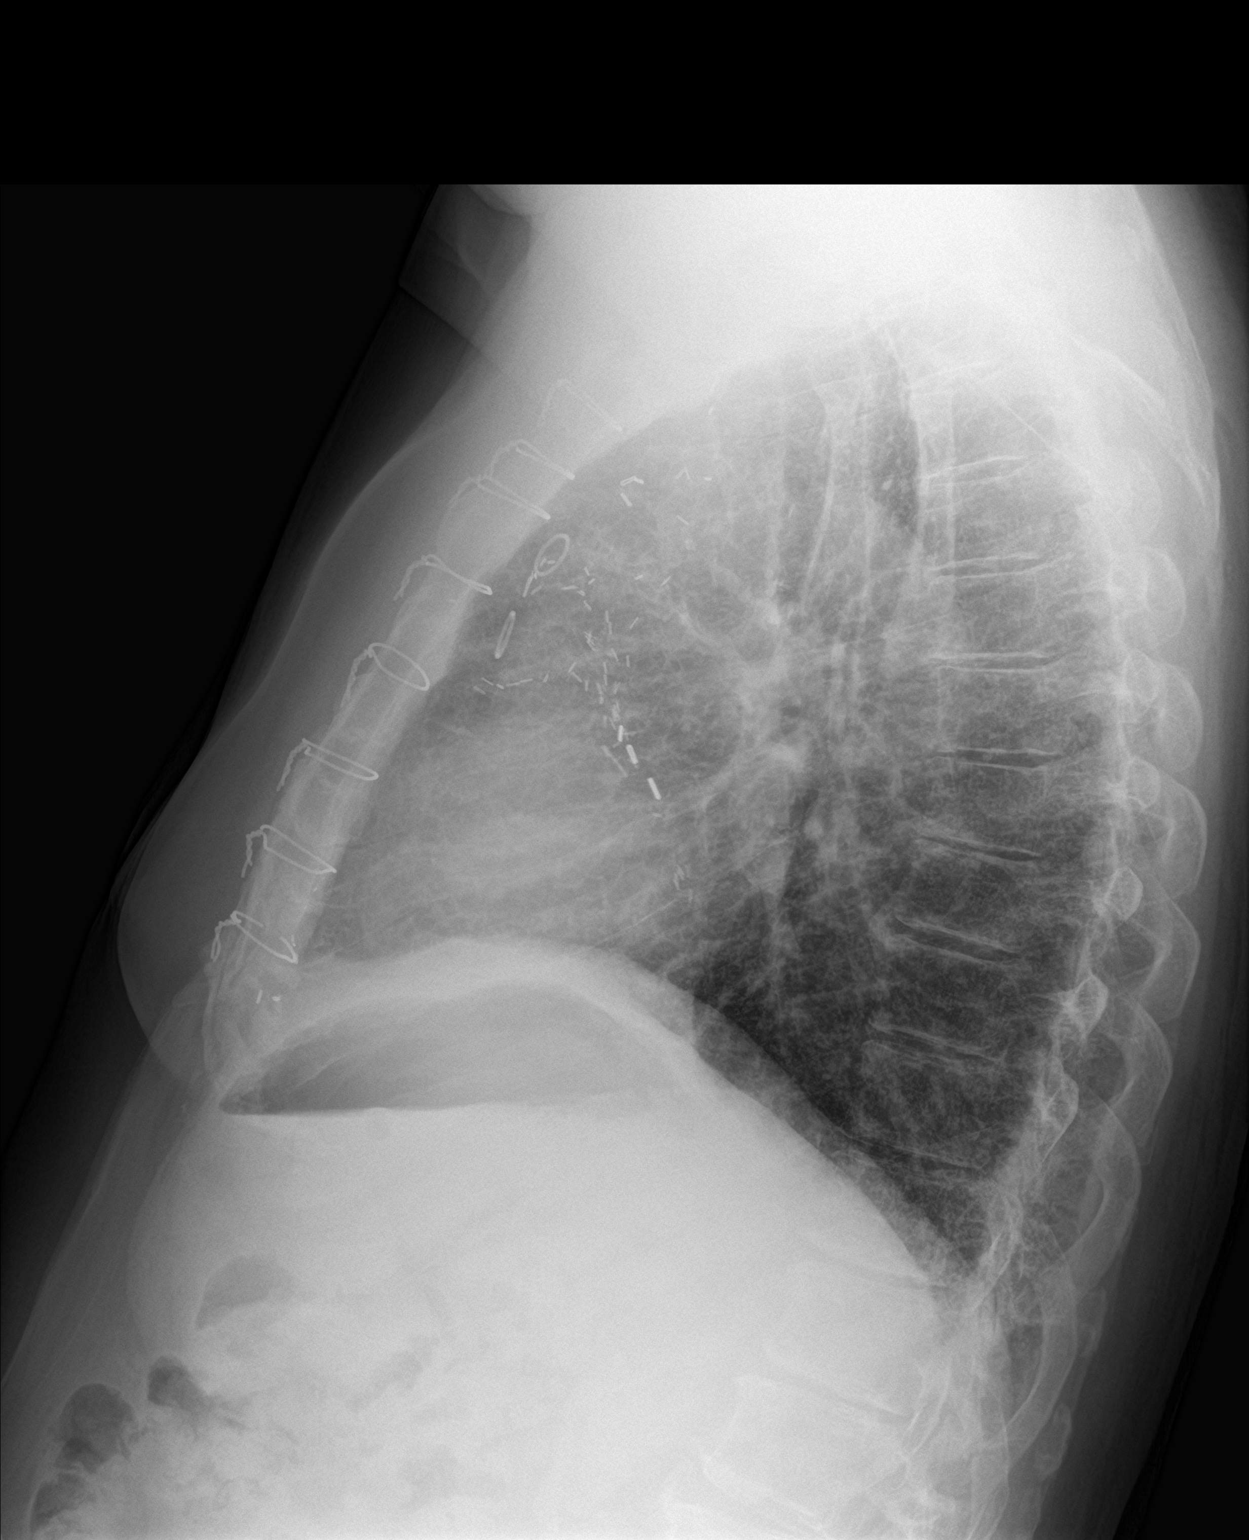

[2 of 2 positions shown; findings below may reference images not displayed]

FINDINGS: Stable cardiac silhouette. Status post CABG. Sternotomy wires are
aligned and intact. Surgical clips at the gastroesophageal junction.
Stable reticular markings in the lung may represent fibrosis or
chronic bronchitic changes. No focal consolidation, effusion, or
pneumothorax. Bones are unremarkable.
IMPRESSION: No acute process identified. Stable reticular markings in lungs may
represent fibrosis or chronic bronchitic changes.

By: [place]onaho Senti M.D.

## 2018-10-21 ENCOUNTER — Other Ambulatory Visit: Payer: Self-pay | Admitting: Pharmacist

## 2018-10-21 ENCOUNTER — Other Ambulatory Visit: Payer: Self-pay

## 2018-10-21 NOTE — Patient Outreach (Signed)
[place] [facility]) Care Management  [place]   10/21/2018  Raydan Schlabach [place]back 1941-02-26 [id number]  Reason for referral: Medication Assistance  Referral source: [facility] RN with [facility]TA C-SNP  Current insurance:[employer]  Unsuccessful telephone call attempt #1 to patient. HIPAA compliant voicemail left requesting a return call.  Spoke with spouse who will relay message to patient.   Plan:  I will make another outreach attempt to patient within 3-4 business days   Ralene Bathe, PharmD, [place] [phone]

## 2018-10-21 NOTE — Patient Outreach (Addendum)
  [facility]) Care Management Chronic Special Needs Program  10/21/2018  Name: Todd [name] DOB: June 10, 1941  MRN: [id number]  Todd [name] is enrolled in a chronic special needs plan for Diabetes.  Interdisciplinary Care Team meeting completed. The client's individualized care plan was developed based upon the completed health risk assessment.    Participants: Mahlon Gammon, RNCM; Peter Garter, RNCM; Thea Silversmith, [facility]  Plan:  . Send unsuccessful outreach letter with a copy of individualized care plan to client . Send individualized care plan to provider . Pharmacy referral based on self- report: sometimes goes without medications due to cost.  Chronic care management coordinator will attempt outreach in 2-4 months.   Thea Silversmith, RN, MSN, [place] [phone]

## 2018-10-22 ENCOUNTER — Other Ambulatory Visit: Payer: Self-pay

## 2018-10-22 MED ORDER — GLIMEPIRIDE 2 MG PO TABS: ORAL_TABLET | ORAL | 1 refills | Status: DC

## 2018-10-28 ENCOUNTER — Other Ambulatory Visit: Payer: Self-pay | Admitting: Pharmacist

## 2018-10-28 NOTE — Patient Outreach (Signed)
[place] [facility]) Care Management  [place]  10/28/2018  Todd [name] 1940/10/27 [id number]   Reason for call: Medication assistance  Unsuccessful telephone call attempt #2 to patient.   Unable to leave message  Plan:  I will make another outreach attempt to patient within 3-4 business days   Ralene Bathe, PharmD, [place] [phone]

## 2018-10-30 NOTE — Patient Instructions (Addendum)
Tests ordered today. Your results will be released to [place] (or called to you) after review, usually within 72hours after test completion. If any changes need to be made, you will be notified at that same time.  All other Health Maintenance issues reviewed.   All recommended immunizations and age-appropriate screenings are up-to-date or discussed.  No immunizations administered today.   Medications reviewed and updated.  Changes include :     Your prescription(s) have been submitted to your pharmacy. Please take as directed and contact our office if you believe you are having problem(s) with the medication(s).  A referral was ordered for   Please followup in 6 months     Health Maintenance, Male A healthy lifestyle and preventive care is important for your health and wellness. Ask your health care provider about what schedule of regular examinations is right for you. What should I know about weight and diet? Eat a Healthy Diet  Eat plenty of vegetables, fruits, whole grains, low-fat dairy products, and lean protein.  Do not eat a lot of foods high in solid fats, added sugars, or salt.  Maintain a Healthy Weight Regular exercise can help you achieve or maintain a healthy weight. You should:  Do at least 150 minutes of exercise each week. The exercise should increase your heart rate and make you sweat (moderate-intensity exercise).  Do strength-training exercises at least twice a week. Watch Your Levels of Cholesterol and Blood Lipids  Have your blood tested for lipids and cholesterol every 5 years starting at 76 years of age. If you are at high risk for heart disease, you should start having your blood tested when you are 76 years old. You may need to have your cholesterol levels checked more often if: ? Your lipid or cholesterol levels are high. ? You are older than 76 years of age. ? You are at high risk for heart disease. What should I know about cancer screening? Many  types of cancers can be detected early and may often be prevented. Lung Cancer  You should be screened every year for lung cancer if: ? You are a current smoker who has smoked for at least 30 years. ? You are a former smoker who has quit within the past 15 years.  Talk to your health care provider about your screening options, when you should start screening, and how often you should be screened. Colorectal Cancer  Routine colorectal cancer screening usually begins at 76 years of age and should be repeated every 5-10 years until you are 76 years old. You may need to be screened more often if early forms of precancerous polyps or small growths are found. Your health care provider may recommend screening at an earlier age if you have risk factors for colon cancer.  Your health care provider may recommend using home test kits to check for hidden blood in the stool.  A small camera at the end of a tube can be used to examine your colon (sigmoidoscopy or colonoscopy). This checks for the earliest forms of colorectal cancer. Prostate and Testicular Cancer  Depending on your age and overall health, your health care provider may do certain tests to screen for prostate and testicular cancer.  Talk to your health care provider about any symptoms or concerns you have about testicular or prostate cancer. Skin Cancer  Check your skin from head to toe regularly.  Tell your health care provider about any new moles or changes in moles, especially if: ? There  is a change in a mole's size, shape, or color. ? You have a mole that is larger than a pencil eraser.  Always use sunscreen. Apply sunscreen liberally and repeat throughout the day.  Protect yourself by wearing long sleeves, pants, a wide-brimmed hat, and sunglasses when outside. What should I know about heart disease, diabetes, and high blood pressure?  If you are 22-46 years of age, have your blood pressure checked every 3-5 years. If you are  67 years of age or older, have your blood pressure checked every year. You should have your blood pressure measured twice--once when you are at a hospital or clinic, and once when you are not at a hospital or clinic. Record the average of the two measurements. To check your blood pressure when you are not at a hospital or clinic, you can use: ? An automated blood pressure machine at a pharmacy. ? A home blood pressure monitor.  Talk to your health care provider about your target blood pressure.  If you are between 44-55 years old, ask your health care provider if you should take aspirin to prevent heart disease.  Have regular diabetes screenings by checking your fasting blood sugar level. ? If you are at a normal weight and have a low risk for diabetes, have this test once every three years after the age of 97. ? If you are overweight and have a high risk for diabetes, consider being tested at a younger age or more often.  A one-time screening for abdominal aortic aneurysm (AAA) by ultrasound is recommended for men aged 75-75 years who are current or former smokers. What should I know about preventing infection? Hepatitis B If you have a higher risk for hepatitis B, you should be screened for this virus. Talk with your health care provider to find out if you are at risk for hepatitis B infection. Hepatitis C Blood testing is recommended for:  Everyone born from [birth year] through 1963.  Anyone with known risk factors for hepatitis C. Sexually Transmitted Diseases (STDs)  You should be screened each year for STDs including gonorrhea and chlamydia if: ? You are sexually active and are younger than 76 years of age. ? You are older than 75 years of age and your health care provider tells you that you are at risk for this type of infection. ? Your sexual activity has changed since you were last screened and you are at an increased risk for chlamydia or gonorrhea. Ask your health care provider if you  are at risk.  Talk with your health care provider about whether you are at high risk of being infected with HIV. Your health care provider may recommend a prescription medicine to help prevent HIV infection. What else can I do?  Schedule regular health, dental, and eye exams.  Stay current with your vaccines (immunizations).  Do not use any tobacco products, such as cigarettes, chewing tobacco, and e-cigarettes. If you need help quitting, ask your health care provider.  Limit alcohol intake to no more than 2 drinks per day. One drink equals 12 ounces of beer, 5 ounces of wine, or 1 ounces of hard liquor.  Do not use street drugs.  Do not share needles.  Ask your health care provider for help if you need support or information about quitting drugs.  Tell your health care provider if you often feel depressed.  Tell your health care provider if you have ever been abused or do not feel safe at home. This  information is not intended to replace advice given to you by your health care provider. Make sure you discuss any questions you have with your health care provider. Document Released: 02/03/2008 Document Revised: 04/05/2016 Document Reviewed: 05/11/2015 Elsevier Interactive Patient Education  2019 [employer].

## 2018-10-30 NOTE — Progress Notes (Signed)
Subjective:    Patient ID: Todd [name], male    DOB: 01/07/1941, 78 y.o.   MRN: [id number]  HPI He is here for a physical exam.   Recently he was experiencing right leg pain.  It started on his anterior right lower leg and that lasted for couple of days and then went away.  He then had pain behind the knee, which again lasted for couple days and then the pain was in the calf and then the anterior ankle.  He decreased his walking and the pain has resolved and he denies any pain now.  He plans on starting to walk again.  He has no other concerns.  Both him and his wife have been eating more healthy and they are working on weight loss.  Medications and allergies reviewed with patient and updated if appropriate.  Patient Active Problem List   Diagnosis Date Noted  . Eczema 10/29/2017  . Hives 08/13/2017  . Cough 12/04/2016  . 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 Hemorrhoids, internal, with bleeding 01/15/2015  . Bursitis of left shoulder 01/14/2015  . Type 2 diabetes, controlled, with peripheral circulatory disorder ([place]) 12/05/2013  . PVD (peripheral vascular disease) ([employer]) 11/12/2013  . Atrial fibrillation with RVR ([place]) 11/03/2013  . Carotid artery disease ([place]) 12/20/2009  . OVERWEIGHT/OBESITY 11/17/2008  . Hyperlipidemia 07/20/2008  . Essential hypertension 07/20/2008  . Coronary atherosclerosis 07/20/2008  . ALLERGIC RHINITIS 07/20/2008  . NEPHROLITHIASIS, HX OF 07/20/2008    Current Outpatient Medications on File Prior to Visit  Medication Sig Dispense Refill  . blood glucose meter kit and supplies KIT Dispense based on patient and insurance preference. Use up to four times daily as directed. (FOR E11.9). 1 each 0  . cetirizine (ZYRTEC) 10 MG tablet Take 10 mg by mouth daily as needed for allergies.     . Cholecalciferol (VITAMIN D-3) 5000 UNITS TABS Take 5,000 Units by mouth daily.     Marland Kitchen glimepiride (AMARYL) 2 MG tablet Take 1 tablet (2 mg total) daily. 90 tablet 1  .  glucose 4 GM chewable tablet Chew 1 tablet by mouth as needed for low blood sugar.    Marland Kitchen glucose blood (ONE TOUCH ULTRA TEST) test strip Test blood sugar once daily 100 each 1  . metFORMIN (GLUCOPHAGE-XR) 500 MG 24 hr tablet Take 2 tablets (1,037m total) by mouth twice a day 360 tablet 1  . metoprolol tartrate (LOPRESSOR) 50 MG tablet Take 1 tablet (50 mg total) by mouth 2 (two) times daily. 180 tablet 3  . nitroGLYCERIN (NITROSTAT) 0.4 MG SL tablet Place 1 tablet (0.4 mg total) under the tongue every 5 (five) minutes as needed for chest pain. 100 tablet 3  . omeprazole (PRILOSEC) 20 MG capsule Take 1 capsule (20 mg total) by mouth daily. 90 capsule 3  . ONETOUCH DELICA LANCETS 319JMISC TEST BLOOD SUGAR ONCE DAILY 100 each 3  . polycarbophil (FIBERCON) 625 MG tablet Take 625 mg by mouth 2 (two) times daily.     . simvastatin (ZOCOR) 40 MG tablet Take 1 tablet (40 mg total) by mouth every evening. 90 tablet 3  . sitaGLIPtin (JANUVIA) 100 MG tablet Take 1 tablet (100 mg total) by mouth daily. 90 tablet 1  . triamcinolone cream (KENALOG) 0.1 %   3  . XARELTO 20 MG TABS tablet Take 1 tablet by mouth daily with supper. 90 tablet 3   No current facility-administered medications on file prior to visit.  Past Medical History:  Diagnosis Date  . Allergic rhinitis   . Asthma   . Atrial fibrillation with rapid ventricular response ([place])    a. newly diagnosed 11/03/13, spont conv to NSR, placed on xarelto.  Marland Kitchen CAD (coronary artery disease)    a. 1999; s/p CABG: LIMA to LAD, SVG to OM1, SVG to OM2, SVG to RCA  b. Normal nuc 10/2013 (done because of new onset AF.)  . Diabetes mellitus ([place])   . HLD (hyperlipidemia)   . HTN (hypertension)   . Nephrolithiasis   . Overweight(278.02)   . PVD (peripheral vascular disease) ([facility])    Carotid stenosis  . Stroke [facility])     Past Surgical History:  Procedure Laterality Date  . CAROTID ENDARTERECTOMY  1999  . COLONOSCOPY  2014   negative;Dr Sharlett Iles  .  COLONOSCOPY W/ POLYPECTOMY  2009   Dr Sharlett Iles  . CORONARY ARTERY BYPASS GRAFT  Aug.1999  . HEMORRHOID BANDING    . INTRACAPSULAR CATARACT EXTRACTION Bilateral 2018  . NASAL SINUS SURGERY      Social History   Socioeconomic History  . Marital status: Married    Spouse name: [name]  . Number of children: 2  . Years of education: Not on file  . Highest education level: Not on file  Occupational History  . Occupation: Press photographer Manager-retired  Social Needs  . Financial resource strain: Not hard at all  . Food insecurity:    Worry: Never true    Inability: Never true  . Transportation needs:    Medical: No    Non-medical: No  Tobacco Use  . Smoking status: Never Smoker  . Smokeless tobacco: Never Used  Substance and Sexual Activity  . Alcohol use: No    Alcohol/week: 0.0 standard drinks  . Drug use: No  . Sexual activity: Yes  Lifestyle  . Physical activity:    Days per week: 3 days    Minutes per session: 50 min  . Stress: Not at all  Relationships  . Social connections:    Talks on phone: More than three times a week    Gets together: More than three times a week    Attends religious service: More than 4 times per year    Active member of club or organization: Yes    Attends meetings of clubs or organizations: More than 4 times per year    Relationship status: Married  Other Topics Concern  . Not on file  Social History Narrative  . Not on file    Family History  Problem Relation Age of Onset  . Prostate cancer Brother   . Hemochromatosis Brother   . Colon cancer Brother 64  . Heart attack Brother 65  . Asthma Sister   . Heart attack Mother 87  . Heart attack Brother 42  . Diabetes Father        IDDM  . Stroke Father 65    Review of Systems  Constitutional: Negative for chills and fever.  HENT: Positive for voice change (Occasional).   Eyes: Negative for visual disturbance.  Respiratory: Positive for cough (Occasional). Negative for shortness of breath  and wheezing.   Cardiovascular: Positive for leg swelling. Negative for chest pain and palpitations.  Gastrointestinal: Positive for anal bleeding (Occasional, mild from hemorrhoids). Negative for abdominal pain, blood in stool, constipation, diarrhea and nausea.  Genitourinary: Negative for dysuria and hematuria.  Musculoskeletal: Positive for arthralgias (Fingers-arthritis).  Skin: Negative for color change and rash.  Eczema   Neurological: Negative for light-headedness and headaches.  Psychiatric/Behavioral: Negative for dysphoric mood. The patient is not nervous/anxious.        Objective:   Vitals:   10/31/18 1014  BP: 136/88  Pulse: 72  Resp: 17  Temp: 98.2 F (36.8 C)  SpO2: 98%   There were no vitals filed for this visit. Body mass index is 30.68 kg/m.  Wt Readings from Last 3 Encounters:  08/08/18 201 lb 12.8 oz (91.5 kg)  07/23/18 203 lb (92.1 kg)  05/02/18 198 lb (89.8 kg)     Physical Exam Constitutional: He appears well-developed and well-nourished. No distress.  HENT:  Head: Normocephalic and atraumatic.  Right Ear: External ear normal.  Left Ear: External ear normal.  Mouth/Throat: Oropharynx is clear and moist.  Normal ear canals and TM b/l  Eyes: Conjunctivae and EOM are normal.  Neck: Neck supple. No tracheal deviation present. No thyromegaly present. No carotid bruit  Cardiovascular: Normal rate, regular rhythm, normal heart sounds and intact distal pulses.   No murmur heard. Pulmonary/Chest: Effort normal and breath sounds normal. No respiratory distress. He has no wheezes. He has no rales.  Abdominal: Soft.  Ventral and umbilical hernia-dose reducible and nontender, he exhibits no distension. There is no tenderness.  Genitourinary: deferred  Musculoskeletal: He exhibits no edema.  Lymphadenopathy:   He has no cervical adenopathy.  Skin: Skin is warm and dry. He is not diaphoretic.  Psychiatric: He has a normal mood and affect. His behavior  is normal.         Assessment & Plan:   Physical exam: Screening blood work  ordered Immunizations  Up to date  Colonoscopy  Up to date  Eye exams    up-to-date EKG   07/2018 Exercise walking regularly-states that as the weather becomes nicer he will start walking more regularly Weight  -he is working on weight loss and encouraged him to continue his efforts Skin no concerns Substance abuse    none  See Problem List for Assessment and Plan of chronic medical problems.

## 2018-10-30 NOTE — Progress Notes (Addendum)
Subjective:   Todd [name] is a 78 y.o. male who presents for Medicare Annual/Subsequent preventive examination.  Review of Systems:  No ROS.  Medicare Wellness Visit. Additional risk factors are reflected in the social history.  Cardiac Risk Factors include: advanced age (>34mn, >>64women);diabetes mellitus;dyslipidemia;male gender;hypertension Sleep patterns: feels rested on waking, gets up 1-3 times nightly to void and sleeps 7 hours nightly.    Home Safety/Smoke Alarms: Feels safe in home. Smoke alarms in place.  Living environment; residence and Firearm Safety: 1-story house/ trailer. Lives with wife, no needs for DME, good support system, Seat Belt Safety/Bike Helmet: Wears seat belt.   PSA-  Lab Results  Component Value Date   PSA 0.24 07/20/2008       Objective:    Vitals: BP 136/88   Pulse 72   Temp 98.2 F (36.8 C)   Resp 17   Ht _0  (1.727 m)   Wt 199 lb (90.3 kg)   SpO2 98%   BMI 30.26 kg/m   Body mass index is 30.26 kg/m.  Advanced Directives 10/31/2018 10/21/2018 10/29/2017 01/15/2015 11/11/2014 11/04/2013  Does Patient Have a Medical Advance Directive? Yes Yes Yes Yes No Patient has advance directive, copy not in chart  Type of Advance Directive [place] Living will [place] will - - Living will  Does patient want to make changes to medical advance directive? - - - - - No change requested  Copy of [place] Chart? No - copy requested - No - copy requested Yes - -  Pre-existing out of facility DNR order (yellow form or pink MOST form) - - - - - No    Tobacco Social History   Tobacco Use  Smoking Status Never Smoker  Smokeless Tobacco Never Used     Counseling given: Not Answered  Past Medical History:  Diagnosis Date  . Allergic rhinitis   . Asthma   . [place]    a. newly diagnosed 11/03/13, spont conv to NSR, placed  on xarelto.  .Marland KitchenCAD (coronary artery disease)    a. 1999; s/p CABG: LIMA to LAD, SVG to OM1, SVG to OM2, SVG to RCA  b. Normal nuc 10/2013 (done because of new onset AF.)  . Diabetes mellitus ([place]   . HLD (hyperlipidemia)   . HTN (hypertension)   . Nephrolithiasis   . Overweight(278.02)   . PVD (peripheral vascular disease) ([facility])    Carotid stenosis  . Stroke ([facility]    Past Surgical History:  Procedure Laterality Date  . CAROTID ENDARTERECTOMY  1999  . COLONOSCOPY  2014   negative;Dr PSharlett Iles . COLONOSCOPY W/ POLYPECTOMY  2009   Dr PSharlett Iles . CORONARY ARTERY BYPASS GRAFT  Aug.1999  . HEMORRHOID BANDING    . INTRACAPSULAR CATARACT EXTRACTION Bilateral 2018  . NASAL SINUS SURGERY     Family History  Problem Relation Age of Onset  . Prostate cancer Brother   . Hemochromatosis Brother   . Colon cancer Brother 753 . Heart attack Brother 548 . Asthma Sister   . Heart attack Mother 548 . Heart attack Brother 570 . Diabetes Father        IDDM  . Stroke Father 835  Social History   Socioeconomic History  . Marital status: Married    Spouse name: JBethena Roys . Number of children: 2  . Years of education: Not  on file  . Highest education level: Not on file  Occupational History  . Occupation: Press photographer Manager-retired  Social Needs  . Financial resource strain: Not hard at all  . Food insecurity:    Worry: Never true    Inability: Never true  . Transportation needs:    Medical: No    Non-medical: No  Tobacco Use  . Smoking status: Never Smoker  . Smokeless tobacco: Never Used  Substance and Sexual Activity  . Alcohol use: No    Alcohol/week: 0.0 standard drinks  . Drug use: No  . Sexual activity: Yes  Lifestyle  . Physical activity:    Days per week: 3 days    Minutes per session: 50 min  . Stress: Not at all  Relationships  . Social connections:    Talks on phone: More than three times a week    Gets together: More than three times a week    Attends religious  service: More than 4 times per year    Active member of club or organization: Yes    Attends meetings of clubs or organizations: More than 4 times per year    Relationship status: Married  Other Topics Concern  . Not on file  Social History Narrative  . Not on file    Outpatient Encounter Medications as of 10/31/2018  Medication Sig  . Omega-3 Fatty Acids (FISH OIL PO) Take 1 tablet by mouth daily.  . blood glucose meter kit and supplies KIT Dispense based on patient and insurance preference. Use up to four times daily as directed. (FOR E11.9).  . cetirizine (ZYRTEC) 10 MG tablet Take 10 mg by mouth daily as needed for allergies.   . Cholecalciferol (VITAMIN D-3) 5000 UNITS TABS Take 5,000 Units by mouth daily.   Marland Kitchen glimepiride (AMARYL) 2 MG tablet Take 1 tablet (2 mg total) daily.  Marland Kitchen glucose 4 GM chewable tablet Chew 1 tablet by mouth as needed for low blood sugar.  Marland Kitchen glucose blood (ONE TOUCH ULTRA TEST) test strip Test blood sugar once daily  . metFORMIN (GLUCOPHAGE-XR) 500 MG 24 hr tablet Take 2 tablets (1,019m total) by mouth twice a day  . metoprolol tartrate (LOPRESSOR) 50 MG tablet Take 1 tablet (50 mg total) by mouth 2 (two) times daily.  . nitroGLYCERIN (NITROSTAT) 0.4 MG SL tablet Place 1 tablet (0.4 mg total) under the tongue every 5 (five) minutes as needed for chest pain.  .Marland Kitchenomeprazole (PRILOSEC) 20 MG capsule Take 1 capsule (20 mg total) by mouth daily.  .Glory RosebushDELICA LANCETS 395KMISC TEST BLOOD SUGAR ONCE DAILY  . polycarbophil (FIBERCON) 625 MG tablet Take 625 mg by mouth 2 (two) times daily.   . simvastatin (ZOCOR) 40 MG tablet Take 1 tablet (40 mg total) by mouth every evening.  . sitaGLIPtin (JANUVIA) 100 MG tablet Take 1 tablet (100 mg total) by mouth daily.  .Marland Kitchentriamcinolone cream (KENALOG) 0.1 %   . XARELTO 20 MG TABS tablet Take 1 tablet by mouth daily with supper.   No facility-administered encounter medications on file as of 10/31/2018.     Activities of  Daily Living In your present state of health, do you have any difficulty performing the following activities: 10/31/2018 10/21/2018  Hearing? N -  Vision? N -  Difficulty concentrating or making decisions? N N  Walking or climbing stairs? N -  Dressing or bathing? N N  Doing errands, shopping? N -  Preparing Food and eating ? N -  Using the Toilet? N -  In the past six months, have you accidently leaked urine? N -  Do you have problems with loss of bowel control? N -  Managing your Medications? N -  Managing your Finances? N N  Housekeeping or managing your Housekeeping? - N  Some recent data might be hidden    Patient Care Team: Binnie Rail, MD as PCP - General (Internal Medicine) Gatha Mayer, MD as Consulting Physician (Gastroenterology) Minus Breeding, MD as Consulting Physician (Cardiology) Luretha Rued, RN as [facility]   Assessment:   This is a routine wellness examination for [place]. Physical assessment deferred to PCP.  Exercise Activities and Dietary recommendations Current Exercise Habits: Home exercise routine, Type of exercise: walking, Time (Minutes): 35, Frequency (Times/Week): 3, Weekly Exercise (Minutes/Week): 105, Intensity: Mild, Exercise limited by: orthopedic condition(s)  Diet (meal preparation, eat out, water intake, caffeinated beverages, dairy products, fruits and vegetables): in general, a "healthy" diet  , well balanced   Reviewed heart healthy and diabetic diet. Encouraged patient to increase daily water and healthy fluid intake.  Goals      Patient Stated   . <enter goal here> (pt-stated)     Hx of exercise in gym; Walks out in yard Goal weight is 190;  Continue goal of no white; plant fat      Other   . Client understands the importance of follow-up with providers by attending scheduled visits    . Client verbalizes knowledge of Heart Attack self management skills within the next 6-9 months.    . Client  will not report change from baseline and no repeated symptoms of stroke with in the next 6-9 months     . Client will report no worsening of symptoms of Atrial Fibrillation within the next 6-9 months    . Client will report no worsening of symptoms related to heart disease within the next 6-9 months.     . Client will use Assistive Devices as needed and verbalize understanding of device use    . Client will verbalize knowledge of chronic lung disease as evidenced by no ED visits or Inpatient stays related to chronic lung disease     . Decrease the use of hospital emergency department related to diabetes within the next year     . HEMOGLOBIN A1C < 7.0    . Maintain timely refills of diabetic medication as prescribed within the year .    Marland Kitchen Obtain annual  Lipid Profile, LDL-C    . Obtain Annual Eye (retinal)  Exam     . Obtain Annual Foot Exam    . Obtain annual screen for micro albuminuria (urine) , nephropathy (kidney problems)    . Obtain Hemoglobin A1C at least 2 times per year    . Patient Stated     I would like to get back to going to the gym. I will just go to the gym 3 times a week.    . Visit Primary Care Provider or Endocrinologist at least 2 times per year        Fall Risk Fall Risk  10/31/2018 10/29/2017 05/01/2017 04/27/2016 01/15/2015  Falls in the past year? 0 No No No No  Number falls in past yr: 0 - - - -   Depression Screen PHQ 2/9 Scores 10/31/2018 10/29/2017 05/01/2017 04/27/2016  PHQ - 2 Score 0 0 0 0  PHQ- 9 Score - 2 - -    Cognitive Function  MMSE - Mini Mental State Exam 01/15/2015  Not completed: Unable to complete       Ad8 score reviewed for issues:  Issues making decisions: no  Less interest in hobbies / activities: no  Repeats questions, stories (family complaining): no  Trouble using ordinary gadgets (microwave, computer, phone):no  Forgets the month or year: no  Mismanaging finances: no  Remembering appts: no  Daily problems with thinking and/or  memory: no Ad8 score is= 0  Immunization History  Administered Date(s) Administered  . Influenza Split 07/06/2011, 05/15/2012  . Influenza Whole 05/12/2010  . Influenza, High Dose Seasonal PF 05/30/2017, 05/02/2018  . Influenza,inj,Quad PF,6+ Mos 05/21/2013  . Influenza-Unspecified 05/20/2014, 06/11/2015, 05/24/2016  . Pneumococcal Conjugate-13 01/15/2015  . Pneumococcal Polysaccharide-23 05/22/2005, 05/05/2010  . Tdap 12/03/2012  . Zoster 10/20/2010  . Zoster Recombinat (Shingrix) 12/19/2017, 03/04/2018   Screening Tests Health Maintenance  Topic Date Due  . OPHTHALMOLOGY EXAM  11/30/2017  . FOOT EXAM  10/30/2018  . URINE MICROALBUMIN  10/30/2018  . HEMOGLOBIN A1C  10/31/2018  . COLONOSCOPY  11/12/2022  . TETANUS/TDAP  12/04/2022  . INFLUENZA VACCINE  Completed  . PNA vac Low Risk Adult  Completed       Plan:     Reviewed health maintenance screenings with patient today and relevant education, vaccines, and/or referrals were provided.   Continue doing brain stimulating activities (puzzles, reading, adult coloring books, staying active) to keep memory sharp.   Continue to eat heart healthy diet (full of fruits, vegetables, whole grains, lean protein, water--limit salt, fat, and sugar intake) and increase physical activity as tolerated.  I have personally reviewed and noted the following in the patient's chart:   . Medical and social history . Use of alcohol, tobacco or illicit drugs  . Current medications and supplements . Functional ability and status . Nutritional status . Physical activity . Advanced directives . List of other physicians . Vitals . Screenings to include cognitive, depression, and falls . Referrals and appointments  In addition, I have reviewed and discussed with patient certain preventive protocols, quality metrics, and best practice recommendations. A written personalized care plan for preventive services as well as general preventive health  recommendations were provided to patient.     Michiel Cowboy, RN  10/31/2018   Medical screening examination/treatment/procedure(s) were performed by non-physician practitioner and as supervising physician I was immediately available for consultation/collaboration. I agree with above. Binnie Rail, MD

## 2018-10-31 ENCOUNTER — Ambulatory Visit (INDEPENDENT_AMBULATORY_CARE_PROVIDER_SITE_OTHER): Payer: HMO | Admitting: *Deleted

## 2018-10-31 ENCOUNTER — Other Ambulatory Visit: Payer: Self-pay | Admitting: Pharmacist

## 2018-10-31 ENCOUNTER — Ambulatory Visit: Payer: PPO | Admitting: Internal Medicine

## 2018-10-31 ENCOUNTER — Encounter: Payer: Self-pay | Admitting: Internal Medicine

## 2018-10-31 ENCOUNTER — Ambulatory Visit: Payer: Self-pay | Admitting: Pharmacist

## 2018-10-31 ENCOUNTER — Other Ambulatory Visit (INDEPENDENT_AMBULATORY_CARE_PROVIDER_SITE_OTHER): Payer: HMO

## 2018-10-31 ENCOUNTER — Ambulatory Visit (INDEPENDENT_AMBULATORY_CARE_PROVIDER_SITE_OTHER): Payer: HMO | Admitting: Internal Medicine

## 2018-10-31 ENCOUNTER — Other Ambulatory Visit: Payer: Self-pay

## 2018-10-31 VITALS — BP 136/88 | HR 72 | Temp 98.2°F | Resp 17 | Ht 68.0 in | Wt 199.0 lb

## 2018-10-31 VITALS — BP 136/88 | HR 72 | Temp 98.2°F | Resp 17 | Ht 68.0 in

## 2018-10-31 DIAGNOSIS — K429 Umbilical hernia without obstruction or gangrene: Secondary | ICD-10-CM

## 2018-10-31 DIAGNOSIS — K439 Ventral hernia without obstruction or gangrene: Secondary | ICD-10-CM | POA: Insufficient documentation

## 2018-10-31 DIAGNOSIS — Z0001 Encounter for general adult medical examination with abnormal findings: Secondary | ICD-10-CM

## 2018-10-31 DIAGNOSIS — I10 Essential (primary) hypertension: Secondary | ICD-10-CM | POA: Diagnosis not present

## 2018-10-31 DIAGNOSIS — I4891 Unspecified atrial fibrillation: Secondary | ICD-10-CM

## 2018-10-31 DIAGNOSIS — E7849 Other hyperlipidemia: Secondary | ICD-10-CM

## 2018-10-31 DIAGNOSIS — E1151 Type 2 diabetes mellitus with diabetic peripheral angiopathy without gangrene: Secondary | ICD-10-CM

## 2018-10-31 DIAGNOSIS — Z0000 Encounter for general adult medical examination without abnormal findings: Secondary | ICD-10-CM

## 2018-10-31 LAB — CBC WITH DIFFERENTIAL/PLATELET
Basophils Absolute: 0 10*3/uL (ref 0.0–0.1)
Basophils Relative: 0.3 % (ref 0.0–3.0)
Eosinophils Absolute: 0.1 10*3/uL (ref 0.0–0.7)
Eosinophils Relative: 1.2 % (ref 0.0–5.0)
HCT: 40.2 % (ref 39.0–52.0)
Hemoglobin: 13.8 g/dL (ref 13.0–17.0)
Lymphocytes Relative: 35.1 % (ref 12.0–46.0)
Lymphs Abs: 2.6 10*3/uL (ref 0.7–4.0)
MCHC: 34.4 g/dL (ref 30.0–36.0)
MCV: 100.2 fl — ABNORMAL HIGH (ref 78.0–100.0)
Monocytes Absolute: 0.7 10*3/uL (ref 0.1–1.0)
Monocytes Relative: 9.9 % (ref 3.0–12.0)
Neutro Abs: 3.9 10*3/uL (ref 1.4–7.7)
Neutrophils Relative %: 53.5 % (ref 43.0–77.0)
Platelets: 174 10*3/uL (ref 150.0–400.0)
RBC: 4.01 Mil/uL — ABNORMAL LOW (ref 4.22–5.81)
RDW: 14.3 % (ref 11.5–15.5)
WBC: 7.4 10*3/uL (ref 4.0–10.5)

## 2018-10-31 LAB — LIPID PANEL
Cholesterol: 101 mg/dL (ref 0–200)
HDL: 28.8 mg/dL — ABNORMAL LOW (ref >39.00)
LDL Cholesterol: 42 mg/dL (ref 0–99)
NonHDL: 71.75
Total CHOL/HDL Ratio: 3
Triglycerides: 147 mg/dL (ref 0.0–149.0)
VLDL: 29.4 mg/dL (ref 0.0–40.0)

## 2018-10-31 LAB — COMPREHENSIVE METABOLIC PANEL
ALT: 26 U/L (ref 0–53)
AST: 32 U/L (ref 0–37)
Albumin: 4.4 g/dL (ref 3.5–5.2)
Alkaline Phosphatase: 64 U/L (ref 39–117)
BUN: 29 mg/dL — ABNORMAL HIGH (ref 6–23)
CO2: 25 mEq/L (ref 19–32)
Calcium: 9.8 mg/dL (ref 8.4–10.5)
Chloride: 104 mEq/L (ref 96–112)
Creatinine, Ser: 1.05 mg/dL (ref 0.40–1.50)
GFR: 68.72 mL/min (ref >60.00)
Glucose, Bld: 84 mg/dL (ref 70–99)
Potassium: 4.2 mEq/L (ref 3.5–5.1)
Sodium: 138 mEq/L (ref 135–145)
Total Bilirubin: 0.6 mg/dL (ref 0.2–1.2)
Total Protein: 7.8 g/dL (ref 6.0–8.3)

## 2018-10-31 LAB — HEMOGLOBIN A1C: Hgb A1c MFr Bld: 7.2 % — ABNORMAL HIGH (ref 4.6–6.5)

## 2018-10-31 LAB — TSH: TSH: 4.25 u[IU]/mL (ref 0.35–4.50)

## 2018-10-31 NOTE — Patient Outreach (Signed)
[facility]) Care Management [place]  10/31/2018  Todd [name] Jaros 1941/04/26 [id number]  Reason for referral: Medication Assistance  Referral source: [facility] RN with [facility]  Current insurance:[employer]  Unsuccessful telephone call attempt #3 to patient. HIPAA compliant voicemail left requesting a return call  [place] case is being closed due to the following reasons:  We have been unable to establish and/or maintain contact with the patient.  I am happy to assist in the future if patient willing to engage with [facility].   Ralene Bathe, PharmD, [place] [phone]

## 2018-10-31 NOTE — Patient Instructions (Signed)
Continue doing brain stimulating activities (puzzles, reading, adult coloring books, staying active) to keep memory sharp.   Continue to eat heart healthy diet (full of fruits, vegetables, whole grains, lean protein, water--limit salt, fat, and sugar intake) and increase physical activity as tolerated.   Preventive Care 76 Years and Older, Male Preventive care refers to lifestyle choices and visits with your health care provider that can promote health and wellness. What does preventive care include?   A yearly physical exam. This is also called an annual well check.  Dental exams once or twice a year.  Routine eye exams. Ask your health care provider how often you should have your eyes checked.  Personal lifestyle choices, including: ? Daily care of your teeth and gums. ? Regular physical activity. ? Eating a healthy diet. ? Avoiding tobacco and drug use. ? Limiting alcohol use. ? Practicing safe sex. ? Taking low doses of aspirin every day. ? Taking vitamin and mineral supplements as recommended by your health care provider. What happens during an annual well check? The services and screenings done by your health care provider during your annual well check will depend on your age, overall health, lifestyle risk factors, and family history of disease. Counseling Your health care provider may ask you questions about your:  Alcohol use.  Tobacco use.  Drug use.  Emotional well-being.  Home and relationship well-being.  Sexual activity.  Eating habits.  History of falls.  Memory and ability to understand (cognition).  Work and work environment. Screening You may have the following tests or measurements:  Height, weight, and BMI.  Blood pressure.  Lipid and cholesterol levels. These may be checked every 5 years, or more frequently if you are over 50 years old.  Skin check.  Lung cancer screening. You may have this screening every year starting at age 55 if you  have a 30-pack-year history of smoking and currently smoke or have quit within the past 15 years.  Colorectal cancer screening. All adults should have this screening starting at age 50 and continuing until age 75. You will have tests every 1-10 years, depending on your results and the type of screening test. People at increased risk should start screening at an earlier age. Screening tests may include: ? Guaiac-based fecal occult blood testing. ? Fecal immunochemical test (FIT). ? Stool DNA test. ? Virtual colonoscopy. ? Sigmoidoscopy. During this test, a flexible tube with a tiny camera (sigmoidoscope) is used to examine your rectum and lower colon. The sigmoidoscope is inserted through your anus into your rectum and lower colon. ? Colonoscopy. During this test, a long, thin, flexible tube with a tiny camera (colonoscope) is used to examine your entire colon and rectum.  Prostate cancer screening. Recommendations will vary depending on your family history and other risks.  Hepatitis C blood test.  Hepatitis B blood test.  Sexually transmitted disease (STD) testing.  Diabetes screening. This is done by checking your blood sugar (glucose) after you have not eaten for a while (fasting). You may have this done every 1-3 years.  Abdominal aortic aneurysm (AAA) screening. You may need this if you are a current or former smoker.  Osteoporosis. You may be screened starting at age 70 if you are at high risk. Talk with your health care provider about your test results, treatment options, and if necessary, the need for more tests. Vaccines Your health care provider may recommend certain vaccines, such as:  Influenza vaccine. This is recommended every year.  Tetanus,   diphtheria, and acellular pertussis (Tdap, Td) vaccine. You may need a Td booster every 10 years.  Varicella vaccine. You may need this if you have not been vaccinated.  Zoster vaccine. You may need this after age 62.  Measles,  mumps, and rubella (MMR) vaccine. You may need at least one dose of MMR if you were born in 1957 or later. You may also need a second dose.  Pneumococcal 13-valent conjugate (PCV13) vaccine. One dose is recommended after age 9.  Pneumococcal polysaccharide (PPSV23) vaccine. One dose is recommended after age 48.  Meningococcal vaccine. You may need this if you have certain conditions.  Hepatitis A vaccine. You may need this if you have certain conditions or if you travel or work in places where you may be exposed to hepatitis A.  Hepatitis B vaccine. You may need this if you have certain conditions or if you travel or work in places where you may be exposed to hepatitis B.  Haemophilus influenzae type b (Hib) vaccine. You may need this if you have certain risk factors. Talk to your health care provider about which screenings and vaccines you need and how often you need them. This information is not intended to replace advice given to you by your health care provider. Make sure you discuss any questions you have with your health care provider. Document Released: 09/03/2015 Document Revised: 09/27/2017 Document Reviewed: 06/08/2015 Elsevier Interactive Patient Education  2019 [employer].

## 2018-10-31 NOTE — Assessment & Plan Note (Signed)
Nontender Monitor

## 2018-10-31 NOTE — Assessment & Plan Note (Signed)
Rate controlled, asymptomatic On Xarelto, metoprolol CBC, CMP

## 2018-10-31 NOTE — Assessment & Plan Note (Signed)
A1c, urine microalbumin Continue current medications-we will adjust if needed Stressed the importance of regular walking Continue weight loss efforts

## 2018-10-31 NOTE — Assessment & Plan Note (Signed)
Blood pressure controlled Continue current medications at current doses Working on weight loss Discussed the importance of regular exercise CMP

## 2018-10-31 NOTE — Assessment & Plan Note (Signed)
He does have discomfort sometimes Reducible, will monitor for now Discussed that if pain increases we may need to consider surgery-discussed symptoms of incarceration

## 2018-10-31 NOTE — Assessment & Plan Note (Addendum)
Check lipid panel, TSH, CMP Continue daily statin Regular exercise and healthy diet encouraged  

## 2018-12-23 ENCOUNTER — Other Ambulatory Visit: Payer: Self-pay

## 2018-12-23 NOTE — Patient Outreach (Signed)
[facility]) Care Management Chronic Special Needs Program  12/23/2018  Name: Todd [name] DOB: 1941/05/22  MRN: [id number]  Mr. Seng Larch is enrolled in a chronic special needs plan for Diabetes. Chronic Care Management Coordinator telephoned to follow up and review individualized care plan.  Introduced the chronic care management program, importance of client participation, and taking their care plan to all provider appointments and inpatient facilities.    Subjective: client spoke to [facility] briefly as he was outside completing some outside work around the house. In addition, she reports he is hard of hearing on the phone. Mr. Gobin request RNCM speak with Mrs. Cantara. She reports client has a history of diabetes; heart disease; lung disease; stroke; atrial fibrillation. Mrs. Nunn reports that client is also on Xarelto and while they are paying for the medication at this time, it is difficult to afford. She is receptive to pharmacy referral. She reports client is independent and remains active.    Goals Addressed            This Visit's Progress   . <enter goal here> (pt-stated)   On track    Hx of exercise in gym; Walks out in yard Goal weight is 190;  Continue goal of no white; plant fat    . Client understands the importance of follow-up with providers by attending scheduled visits   On track   . Client verbalizes knowledge of Heart Attack self management skills within the next 6-9 months.   On track   . Client will not report change from baseline and no repeated symptoms of stroke with in the next 6-9 months    On track   . Client will report no worsening of symptoms of Atrial Fibrillation within the next 6-9 months   On track   . Client will report no worsening of symptoms related to heart disease within the next 6-9 months.    On track   . COMPLETED: Client will use Assistive Devices as needed and verbalize understanding of device use       Verbalizes understanding  of glucose meter use.    . Client will verbalize knowledge of chronic lung disease as evidenced by no ED visits or Inpatient stays related to chronic lung disease    On track   . COMPLETED: Decrease the use of hospital emergency department related to diabetes within the next year        No history of hospitalizations in the past year.    Marland Kitchen HEMOGLOBIN A1C < 7.0       Glucose monitoring per provider recommendations Perform Quality checks on blood meter Eat Healthy Check feet daily Visit provider every 3-6 months as directed Hbg A1C level every 3-6 months. Eye Exam yearly    . Maintain timely refills of diabetic medication as prescribed within the year .   On track   . Obtain annual  Lipid Profile, LDL-C   On track   . Obtain Annual Eye (retinal)  Exam    On track   . Obtain Annual Foot Exam   On track   . Obtain annual screen for micro albuminuria (urine) , nephropathy (kidney problems)   On track   . Obtain Hemoglobin A1C at least 2 times per year   On track   . Patient Stated   On track    I would like to get back to going to the gym. I will just go to the gym 3 times a week.    Marland Kitchen  Visit Primary Care Provider or Endocrinologist at least 2 times per year    On track     Covid precautions discussed. Also reviewed 24 hour nurse advice contact number and encouraged client to call health care concierge for any benefit questions. Client also encouraged to contact RNCM as needed.    Plan:  Send successful outreach letter with a copy of their individualized care plan and Send individual care plan to provider. Chronic care management coordination will outreach in: 6-7 months.  Will refer client to:  Pharmacy   Thea Silversmith, RN, MSN, [place] [phone]

## 2018-12-24 ENCOUNTER — Other Ambulatory Visit: Payer: Self-pay | Admitting: Pharmacist

## 2018-12-24 NOTE — Patient Outreach (Signed)
[facility]) Care Management  [place]   12/24/2018  Todd [name] Sep 28, 1940 [id number]  Reason for referral: Medication Assistance with Xarelto  Referral source: [employer] [place] with [facility] Current insurance: [employer] C-[employer]  Noted recent referral for patient in March 2020 for medication assistance.  [facility] case closed due to inability to maintain contact with patient.   Outreach:  Successful telephone call with patient's spouse today.  HIPAA identifiers verified.   Subjective:  Spouse reports that she helps manage Mr. Terral's medications and that they both are taking Xarelto.  She reports that they both were in the coverage gap in 2019 and had to pay $500/ 90 day supply of medication.  She would like information for patient and herself on Xarelto patient assistance programs.   Objective: Lab Results  Component Value Date   CREATININE 1.05 10/31/2018   CREATININE 1.15 05/02/2018   CREATININE 0.90 10/29/2017    Lab Results  Component Value Date   HGBA1C 7.2 (H) 10/31/2018    Lipid Panel     Component Value Date/Time   CHOL 101 10/31/2018 1211   TRIG 147.0 10/31/2018 1211   TRIG 94 07/19/2006 0727   HDL 28.80 (L) 10/31/2018 1211   CHOLHDL 3 10/31/2018 1211   VLDL 29.4 10/31/2018 1211   LDLCALC 42 10/31/2018 1211    BP Readings from Last 3 Encounters:  10/31/18 136/88  10/31/18 136/88  08/08/18 (!) 142/82    Allergies  Allergen Reactions  . Amlodipine Besylate     Rash Because of a history of documented adverse serious drug reaction;Medi Alert bracelet  is recommended  . Ivp Dye [Iodinated Diagnostic Agents]     Rash Because of a history of documented adverse serious drug reaction;Medi Alert bracelet  is recommended    Medications Reviewed Today    Reviewed by Luretha Rued, RN (Registered Nurse) on 12/23/18 at 1355  Med List Status: <None>  Medication Order Taking? Sig Documenting Provider Last  Dose Status Informant  blood glucose meter kit and supplies KIT [id number]  Dispense based on patient and insurance preference. Use up to four times daily as directed. (FOR E11.9). Binnie Rail, MD  Active   cetirizine (ZYRTEC) 10 MG tablet [id number] Yes Take 10 mg by mouth daily as needed for allergies.  [provider] Taking Active Self  Cholecalciferol (VITAMIN D-3) 5000 UNITS TABS [id number] Yes Take 5,000 Units by mouth daily.  [provider] Taking Active Self           Med Note Anselm Lis Dec 23, 2018  1:52 PM) Takes 1000 units daily  glimepiride (AMARYL) 2 MG tablet [id number] Yes Take 1 tablet (2 mg total) daily. Binnie Rail, MD Taking Active   glucose 4 GM chewable tablet [id number] Yes Chew 1 tablet by mouth as needed for low blood sugar. [provider] Taking Active Self  glucose blood (ONE TOUCH ULTRA TEST) test strip [id number]  Test blood sugar once daily Burns, [name], MD  Active   metFORMIN (GLUCOPHAGE-XR) 500 MG 24 hr tablet [id number] Yes Take 2 tablets (1,'000mg'$  total) by mouth twice a day Binnie Rail, MD Taking Active   metoprolol tartrate (LOPRESSOR) 50 MG tablet [id number] Yes Take 1 tablet (50 mg total) by mouth 2 (two) times daily. Minus Breeding, MD Taking Active   nitroGLYCERIN (NITROSTAT) 0.4 MG SL tablet [id number]  Place 1 tablet (0.4 mg total) under the tongue  every 5 (five) minutes as needed for chest pain. Minus Breeding, MD  Active   Omega-3 Fatty Acids (FISH OIL PO) [id number] Yes Take 1 tablet by mouth daily. [provider] Taking Active Self  omeprazole (PRILOSEC) 20 MG capsule [id number] Yes Take 1 capsule (20 mg total) by mouth daily. Minus Breeding, MD Taking Active   [facility] LANCETS 65L [place] [id number]  TEST BLOOD SUGAR ONCE DAILY Binnie Rail, MD  Active   polycarbophil (FIBERCON) 625 MG tablet [id number] Yes Take 625 mg by mouth 2 (two) times daily.  [provider] Taking Active Self   simvastatin (ZOCOR) 40 MG tablet [id number] Yes Take 1 tablet (40 mg total) by mouth every evening. Minus Breeding, MD Taking Active   sitaGLIPtin (JANUVIA) 100 MG tablet [id number] No Take 1 tablet (100 mg total) by mouth daily.  Patient not taking:  Reported on 12/23/2018   Binnie Rail, MD Not Taking Active   triamcinolone cream (KENALOG) 0.1 % [id number] Yes  [provider] Taking Active   XARELTO 20 MG TABS tablet [id number] Yes Take 1 tablet by mouth daily with supper. Minus Breeding, MD Taking Active           Assessment: Spouse declined medication review at this time.    Medication Assistance Findings:  Medication assistance needs identified: Xarelto  Extra Help:  Not eligible for Extra Help Low Income Subsidy based on reported income and assets  Patient Assistance Programs: Xarelto made by [employer] and [place] requirement met: Yes o Out-of-pocket prescription expenditure met:   No (4% household income)) - Patient not eligible at this time to apply for program as he has not met out-of-pocket expenditure.  Reviewed program in detail with spouse.  Spouse will contact me later this year if patient is near meeting this.  We also discussed possibility of asking for samples at cardiology office.  No further questions at this time.  Plan: . Will close [facility] pharmacy case as no further medication needs identified at this time.  Am happy to assist in the future as needed.     Ralene Bathe, PharmD, [place] [phone]

## 2018-12-25 ENCOUNTER — Telehealth: Payer: Self-pay | Admitting: Cardiology

## 2018-12-25 NOTE — Telephone Encounter (Signed)
Pt's wife aware no  Xarelto samples available .Adonis Housekeeper

## 2018-12-25 NOTE — Telephone Encounter (Signed)
Patient calling the office for samples of medication:   1.  What medication and dosage are you requesting samples for? XARELTO 20 MG TABS tablet  2.  Are you currently out of this medication? no

## 2019-01-14 ENCOUNTER — Other Ambulatory Visit: Payer: Self-pay

## 2019-01-14 MED ORDER — METFORMIN HCL ER 500 MG PO TB24: ORAL_TABLET | ORAL | 1 refills | Status: DC

## 2019-01-14 MED ORDER — GLIMEPIRIDE 2 MG PO TABS: ORAL_TABLET | ORAL | 1 refills | Status: DC

## 2019-03-21 ENCOUNTER — Ambulatory Visit (INDEPENDENT_AMBULATORY_CARE_PROVIDER_SITE_OTHER)
Admission: RE | Admit: 2019-03-21 | Discharge: 2019-03-21 | Disposition: A | Discharge status: Home / Self Care | Payer: HMO | Source: Ambulatory Visit | Attending: Pulmonary Disease | Admitting: Pulmonary Disease

## 2019-03-21 ENCOUNTER — Other Ambulatory Visit: Payer: Self-pay

## 2019-03-21 DIAGNOSIS — J849 Interstitial pulmonary disease, unspecified: Secondary | ICD-10-CM

## 2019-03-21 DIAGNOSIS — J984 Other disorders of lung: Secondary | ICD-10-CM | POA: Diagnosis not present

## 2019-03-21 DIAGNOSIS — J8410 Pulmonary fibrosis, unspecified: Secondary | ICD-10-CM | POA: Diagnosis not present

## 2019-03-25 ENCOUNTER — Telehealth: Payer: Self-pay | Admitting: Pulmonary Disease

## 2019-03-25 NOTE — Telephone Encounter (Signed)
Pt. Returning your call.

## 2019-03-25 NOTE — Telephone Encounter (Signed)
Attempted to call pt to relay the results of the CT to him but unable to reach. Left message for pt to return call.

## 2019-03-25 NOTE — Telephone Encounter (Signed)
Please contact the patient let him know the CT results are as follows based off radiologist impression:  IMPRESSION: 1. 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 Findings are categorized as probable UIP per consensus guidelines: Diagnosis of Idiopathic Pulmonary Fibrosis: An Official ATS/ERS/JRS/ALAT Clinical Practice Guideline. [address], Iss 5, [id number], Apr 21 2017. 2. Pulmonary nodules are stable and considered benign. 3. Cholelithiasis. 4. Possible punctate left renal stone. 5.  Aortic atherosclerosis (ICD10-170.0).   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 This is stable. This was discussed by Dr. Vaughan Browner in December/2019.  This is what Dr. Vaughan Browner was recommending anti-fibrotic therapy for but the patient deferred at that time.  It does not appear that the patient has had his spirometry with DLCO yet.  I have routed this patient's information to [facility] to see if we can work to get him scheduled prior to the 04/02/2019 office visit with Dr. Vaughan Browner.  Please also route this telephone note to her.  If patient has additional questions or concerns regarding follow-up or scheduled appointments this can be discussed further on 04/02/2019 with Dr. Vaughan Browner.  If patient would like to have this discussed before 04/02/2019 please schedule a video visit with an APP.  Wyn Quaker, FNP

## 2019-03-25 NOTE — Telephone Encounter (Signed)
Called and spoke with patient and wife. They will keep appt on 04/02/19 and will await a possible call regarding the breathing test.   Nothing further needed at this time.

## 2019-03-25 NOTE — Telephone Encounter (Signed)
Pt calling requesting to know the results of the CT which was performed 7/31.   Aaron Edelman, please advise on this for pt. Thanks!

## 2019-03-26 ENCOUNTER — Other Ambulatory Visit: Payer: Self-pay | Admitting: Pulmonary Disease

## 2019-03-29 ENCOUNTER — Other Ambulatory Visit (HOSPITAL_COMMUNITY)
Admission: RE | Admit: 2019-03-29 | Discharge: 2019-03-29 | Disposition: A | Discharge status: Home / Self Care | Payer: HMO | Source: Ambulatory Visit | Attending: Pulmonary Disease | Admitting: Pulmonary Disease

## 2019-03-29 DIAGNOSIS — Z01812 Encounter for preprocedural laboratory examination: Secondary | ICD-10-CM | POA: Diagnosis not present

## 2019-03-29 DIAGNOSIS — Z20828 Contact with and (suspected) exposure to other viral communicable diseases: Secondary | ICD-10-CM | POA: Insufficient documentation

## 2019-03-29 LAB — SARS CORONAVIRUS 2 (TAT 6-24 HRS): SARS Coronavirus 2: NEGATIVE

## 2019-04-02 ENCOUNTER — Ambulatory Visit: Payer: HMO | Admitting: Pulmonary Disease

## 2019-04-02 ENCOUNTER — Encounter: Payer: Self-pay | Admitting: Pulmonary Disease

## 2019-04-02 ENCOUNTER — Other Ambulatory Visit: Payer: Self-pay

## 2019-04-02 ENCOUNTER — Ambulatory Visit (INDEPENDENT_AMBULATORY_CARE_PROVIDER_SITE_OTHER): Payer: HMO | Admitting: Pulmonary Disease

## 2019-04-02 DIAGNOSIS — J849 Interstitial pulmonary disease, unspecified: Secondary | ICD-10-CM

## 2019-04-02 LAB — PULMONARY FUNCTION TEST
DL/VA % pred: 92 %
DL/VA: 3.69 ml/min/mmHg/L
DLCO unc % pred: 60 %
DLCO unc: 13.8 ml/min/mmHg
FEF 25-75 Pre: 1.86 L/sec
FEF2575-%Pred-Pre: 97 %
FEV1-%Pred-Pre: 82 %
FEV1-Pre: 2.18 L
FEV1FVC-%Pred-Pre: 111 %
FEV6-%Pred-Pre: 78 %
FEV6-Pre: 2.7 L
FEV6FVC-%Pred-Pre: 107 %
FVC-%Pred-Pre: 72 %
FVC-Pre: 2.7 L
Pre FEV1/FVC ratio: 81 %
Pre FEV6/FVC Ratio: 100 %

## 2019-04-02 NOTE — Progress Notes (Signed)
Todd [name]    [id number]    [birth year]  Primary Care Physician:Burns, [name], MD  Referring Physician: Binnie Rail, MD [place],  [place] [address]  Chief complaint:   Follow up for cough Pulmonary fibrosis, Asbestosis  HPI: Todd [name] is a 78 year old with history of allergies, rhinitis, atrial fibrillation, hypertension, hyperlipidemia, diabetes. He had a confirmed episode of influenza in February and was sent for 2 weeks. He has a lingering nonproductive cough associated with occasional wheezing. He denies any dyspnea, sputum production, fevers, chills. He had been given a prednisone taper last month without any improvement in symptoms. 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  He remains active and is walking 3 miles on the treadmill several times a week.  He is here for review of his CT scan and PFTs.  Outpatient Encounter Medications as of 04/02/2019  Medication Sig  . blood glucose meter kit and supplies KIT Dispense based on patient and insurance preference. Use up to four times daily as directed. (FOR E11.9).  . cetirizine (ZYRTEC) 10 MG tablet Take 10 mg by mouth daily as needed for allergies.   . Cholecalciferol (VITAMIN D-3) 5000 UNITS TABS Take 5,000 Units by mouth daily.   Marland Kitchen glimepiride (AMARYL) 2 MG tablet Take 1 tablet (2 mg total) daily.  Marland Kitchen glucose 4 GM chewable tablet Chew 1 tablet by mouth as needed for low blood sugar.  Marland Kitchen glucose blood (ONE TOUCH ULTRA TEST) test strip Test blood sugar once daily  . metFORMIN (GLUCOPHAGE-XR) 500 MG 24 hr tablet Take 2 tablets (1,055m total) by mouth twice a day  . metoprolol tartrate (LOPRESSOR) 50 MG tablet Take 1 tablet (50 mg total) by  mouth 2 (two) times daily.  . nitroGLYCERIN (NITROSTAT) 0.4 MG SL tablet Place 1 tablet (0.4 mg total) under the tongue every 5 (five) minutes as needed for chest pain.  . Omega-3 Fatty Acids (FISH OIL PO) Take 1 tablet by mouth daily.  .Marland Kitchenomeprazole (PRILOSEC) 20 MG capsule Take 1 capsule (20 mg total) by mouth daily.  .Glory RosebushDELICA LANCETS 348AMISC TEST BLOOD SUGAR ONCE DAILY  . polycarbophil (FIBERCON) 625 MG tablet Take 625 mg by mouth 2 (two) times daily.   . simvastatin (ZOCOR) 40 MG tablet Take 1 tablet (40 mg total) by mouth every evening.  . sitaGLIPtin (JANUVIA) 100 MG tablet Take 1 tablet (100 mg total) by mouth daily.  .Marland Kitchentriamcinolone cream (KENALOG) 0.1 %   . XARELTO 20 MG TABS tablet Take 1 tablet by mouth daily with supper.   No facility-administered encounter medications on file as of 04/02/2019.    Physical Exam: Blood pressure 118/74, pulse 68, temperature 98 F (36.7 C), temperature source Oral, height 5' 8"  (1.727 m), weight 192 lb 9.6 oz (87.4 kg), SpO2 99 %. Gen:      No acute distress HEENT:  EOMI, sclera anicteric Neck:     No masses; no thyromegaly Lungs:    Clear to auscultation bilaterally; normal respiratory effort CV:         Regular rate and rhythm; no murmurs Abd:      + bowel sounds; soft, non-tender; no palpable masses, no  distension Ext:    No edema; adequate peripheral perfusion Skin:      Warm and dry; no rash Neuro: alert and oriented x 3 Psych: normal mood and affect  Data Reviewed: Imaging Chest x-ray 12/04/16-stable interstitial opacities CT abdomen 11/20/13-mild reticular opacities at the bases CT high-resolution 01/02/17- subpleural reticulation, mild traction bronchiectasis, possible early honeycombing with basal gradient. 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  Unchanged since 2018. I have reviewed all the images personally  PFTs  03/20/17- FVC 2.74 (72%], FEV1 2.38 (87%), F/F 87, TLC 62%, DLCO 15.89 (56%)   01/15/2018-FVC 2.74  [73%], FEV1 2.33 [86%], F/F 85, TLC 71%, DLCO 13.13 (46%)  07/23/2018-FVC 2.51 [69%), FEV1 2.10 [78%), DLCO 15.16 (53%) Mild restriction, moderate-severe diffusion defect.  04/02/2019 FVC 2.70 [72%], FEV1 2.18 [82%], F/F 81, DLCO 13.8 [60%] Moderate diffusion defect  FENO 12/18/16- 12  Labs ILD serologies 01/09/17- all negative except for mild elevation in SCL 75.  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 There is a possibility that this could be idiopathic pulmonary fibrosis but most CT scans and PFTs show stability.  We had several discussions with Todd [name] and his wife regarding anti-fibrotic therapy.  Given stability of pulmonary fibrosis and lack of symptoms we have decided to hold off treatment.  They are wary of possible side effects. Reevaluate in 1 year with CT scan and spirometry, diffusion capacity  Health maintenance 01/15/2015-Prevnar 13 05/05/2010-Pneumovax  Plan/Recommendations: - High-res CT in 12 months - Repeat spirometry, diffusion capacity in 12 months  Marshell Garfinkel MD  Pulmonary and Critical Care 04/02/2019, 9:39 AM  CC: Binnie Rail, MD

## 2019-04-02 NOTE — Patient Instructions (Signed)
I have reviewed the CT scan and PFTs which remained stable As you are doing well with regard to symptoms we will continue to watch this  Schedule a high-resolution CT and spirometry, diffusion capacity in 1 year Follow-up in clinic in 1 year

## 2019-04-02 NOTE — Progress Notes (Signed)
PFT completed today 04/02/19.  

## 2019-04-08 DIAGNOSIS — G245 Blepharospasm: Secondary | ICD-10-CM | POA: Diagnosis not present

## 2019-05-05 DIAGNOSIS — J61 Pneumoconiosis due to asbestos and other mineral fibers: Secondary | ICD-10-CM | POA: Insufficient documentation

## 2019-05-05 DIAGNOSIS — J8410 Pulmonary fibrosis, unspecified: Secondary | ICD-10-CM | POA: Insufficient documentation

## 2019-05-05 NOTE — Progress Notes (Signed)
Subjective:    Patient ID: Todd [name], male    DOB: 1941/06/20, 78 y.o.   MRN: [id number]  HPI The patient is here for follow up.  He is active, but not exercising regularly.    CAD, Afib, Hypertension: He is taking his medication daily. He is compliant with a low sodium diet.  He denies chest pain, palpitations, edema, shortness of breath and regular headaches.    Diabetes: He is taking his medication daily as prescribed. He is compliant with a diabetic diet. He checks his feet daily and denies foot lesions. He is up-to-date with an ophthalmology examination.   Hyperlipidemia: He is taking his medication daily. He is compliant with a low fat/cholesterol diet. He denies myalgias.   GERD:  He is taking his medication daily as prescribed.  He denies any GERD symptoms and feels his GERD is well controlled.    Medications and allergies reviewed with patient and updated if appropriate.  Patient Active Problem List   Diagnosis Date Noted  . Pulmonary fibrosis ([place]) 05/05/2019  . Asbestosis ([place]) 05/05/2019  . Ventral hernia without obstruction or gangrene 10/31/2018  . Umbilical hernia without obstruction or gangrene 10/31/2018  . Eczema 10/29/2017  . Hives 08/13/2017  . Cough 12/04/2016  . 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 Hemorrhoids, internal, with bleeding 01/15/2015  . Bursitis of left shoulder 01/14/2015  . Type 2 diabetes, controlled, with peripheral circulatory disorder ([place]) 12/05/2013  . PVD (peripheral vascular disease) ([place]) 11/12/2013  . Atrial fibrillation ([place]) 11/03/2013  . Carotid artery disease ([place]) 12/20/2009  . OVERWEIGHT/OBESITY 11/17/2008  . Hyperlipidemia 07/20/2008  . Essential hypertension 07/20/2008  . Coronary atherosclerosis 07/20/2008  . ALLERGIC RHINITIS 07/20/2008  . NEPHROLITHIASIS, HX OF 07/20/2008    Current Outpatient Medications on File Prior to Visit  Medication Sig Dispense Refill  . blood glucose meter kit and supplies KIT  Dispense based on patient and insurance preference. Use up to four times daily as directed. (FOR E11.9). 1 each 0  . cetirizine (ZYRTEC) 10 MG tablet Take 10 mg by mouth daily as needed for allergies.     . Cholecalciferol (VITAMIN D-3) 5000 UNITS TABS Take 5,000 Units by mouth daily.     Marland Kitchen glimepiride (AMARYL) 2 MG tablet Take 1 tablet (2 mg total) daily. 90 tablet 1  . glucose 4 GM chewable tablet Chew 1 tablet by mouth as needed for low blood sugar.    Marland Kitchen glucose blood (ONE TOUCH ULTRA TEST) test strip Test blood sugar once daily 100 each 1  . metFORMIN (GLUCOPHAGE-XR) 500 MG 24 hr tablet Take 2 tablets (1,050m total) by mouth twice a day 360 tablet 1  . metoprolol tartrate (LOPRESSOR) 50 MG tablet Take 1 tablet (50 mg total) by mouth 2 (two) times daily. 180 tablet 3  . nitroGLYCERIN (NITROSTAT) 0.4 MG SL tablet Place 1 tablet (0.4 mg total) under the tongue every 5 (five) minutes as needed for chest pain. 100 tablet 3  . Omega-3 Fatty Acids (FISH OIL PO) Take 1 tablet by mouth daily.    .Marland Kitchenomeprazole (PRILOSEC) 20 MG capsule Take 1 capsule (20 mg total) by mouth daily. 90 capsule 3  . ONETOUCH DELICA LANCETS 303YMISC TEST BLOOD SUGAR ONCE DAILY 100 each 3  . polycarbophil (FIBERCON) 625 MG tablet Take 625 mg by mouth 2 (two) times daily.     . simvastatin (ZOCOR) 40 MG tablet Take 1 tablet (40 mg total) by mouth every evening. 90 tablet 3  .  sitaGLIPtin (JANUVIA) 100 MG tablet Take 1 tablet (100 mg total) by mouth daily. 90 tablet 1  . triamcinolone cream (KENALOG) 0.1 %   3  . XARELTO 20 MG TABS tablet Take 1 tablet by mouth daily with supper. 90 tablet 3   No current facility-administered medications on file prior to visit.     Past Medical History:  Diagnosis Date  . Allergic rhinitis   . Asthma   . Atrial fibrillation with rapid ventricular response ([place])    a. newly diagnosed 11/03/13, spont conv to NSR, placed on xarelto.  Marland Kitchen CAD (coronary artery disease)    a. 1999; s/p CABG:  LIMA to LAD, SVG to OM1, SVG to OM2, SVG to RCA  b. Normal nuc 10/2013 (done because of new onset AF.)  . Diabetes mellitus ([place])   . HLD (hyperlipidemia)   . HTN (hypertension)   . Nephrolithiasis   . Overweight(278.02)   . PVD (peripheral vascular disease) ([facility])    Carotid stenosis  . Stroke [facility])     Past Surgical History:  Procedure Laterality Date  . CAROTID ENDARTERECTOMY  1999  . COLONOSCOPY  2014   negative;Dr Sharlett Iles  . COLONOSCOPY W/ POLYPECTOMY  2009   Dr Sharlett Iles  . CORONARY ARTERY BYPASS GRAFT  Aug.1999  . HEMORRHOID BANDING    . INTRACAPSULAR CATARACT EXTRACTION Bilateral 2018  . NASAL SINUS SURGERY      Social History   Socioeconomic History  . Marital status: Married    Spouse name: [name]  . Number of children: 2  . Years of education: Not on file  . Highest education level: Not on file  Occupational History  . Occupation: Press photographer Manager-retired  Social Needs  . Financial resource strain: Not hard at all  . Food insecurity    Worry: Never true    Inability: Never true  . Transportation needs    Medical: No    Non-medical: No  Tobacco Use  . Smoking status: Never Smoker  . Smokeless tobacco: Never Used  Substance and Sexual Activity  . Alcohol use: No    Alcohol/week: 0.0 standard drinks  . Drug use: No  . Sexual activity: Yes  Lifestyle  . Physical activity    Days per week: 3 days    Minutes per session: 50 min  . Stress: Not at all  Relationships  . Social connections    Talks on phone: More than three times a week    Gets together: More than three times a week    Attends religious service: More than 4 times per year    Active member of club or organization: Yes    Attends meetings of clubs or organizations: More than 4 times per year    Relationship status: Married  Other Topics Concern  . Not on file  Social History Narrative  . Not on file    Family History  Problem Relation Age of Onset  . Prostate cancer Brother   .  Hemochromatosis Brother   . Colon cancer Brother 69  . Heart attack Brother 27  . Asthma Sister   . Heart attack Mother 73  . Heart attack Brother 27  . Diabetes Father        IDDM  . Stroke Father 60    Review of Systems  Constitutional: Negative for chills and fever.  Respiratory: Positive for cough (phlegm in throat - chronic). Negative for shortness of breath and wheezing.   Cardiovascular: Positive for leg swelling (rare). Negative  for chest pain and palpitations.  Neurological: Negative for light-headedness and headaches.       Objective:   Vitals:   05/06/19 0829  BP: (!) 144/82  Pulse: 66  Resp: 16  Temp: 98.8 F (37.1 C)  SpO2: 97%   BP Readings from Last 3 Encounters:  05/06/19 (!) 144/82  04/02/19 118/74  10/31/18 136/88   Wt Readings from Last 3 Encounters:  05/06/19 194 lb (88 kg)  04/02/19 192 lb 9.6 oz (87.4 kg)  10/31/18 199 lb (90.3 kg)   Body mass index is 29.5 kg/m.   Physical Exam    Constitutional: Appears well-developed and well-nourished. No distress.  HENT:  Head: Normocephalic and atraumatic.  Neck: Neck supple. No tracheal deviation present. No thyromegaly present.  No cervical lymphadenopathy Cardiovascular: Normal rate, regular rhythm and normal heart sounds.   No murmur heard. No carotid bruit .  No edema Pulmonary/Chest: Effort normal and breath sounds normal. No respiratory distress. No has no wheezes. No rales.  Skin: Skin is warm and dry. Not diaphoretic.  Psychiatric: Normal mood and affect. Behavior is normal.      Assessment & Plan:    See Problem List for Assessment and Plan of chronic medical problems.

## 2019-05-05 NOTE — Patient Instructions (Addendum)
  Tests ordered today. Your results will be released to MyChart (or called to you) after review.  If any changes need to be made, you will be notified at that same time.    Medications reviewed and updated.  Changes include :   none     Please followup in 6 months   

## 2019-05-06 ENCOUNTER — Other Ambulatory Visit (INDEPENDENT_AMBULATORY_CARE_PROVIDER_SITE_OTHER): Payer: HMO

## 2019-05-06 ENCOUNTER — Other Ambulatory Visit: Payer: Self-pay

## 2019-05-06 ENCOUNTER — Encounter: Payer: Self-pay | Admitting: Internal Medicine

## 2019-05-06 ENCOUNTER — Ambulatory Visit (INDEPENDENT_AMBULATORY_CARE_PROVIDER_SITE_OTHER): Payer: HMO | Admitting: Internal Medicine

## 2019-05-06 VITALS — BP 144/82 | HR 66 | Temp 98.8°F | Resp 16 | Ht 68.0 in | Wt 194.0 lb

## 2019-05-06 DIAGNOSIS — I2510 Atherosclerotic heart disease of native coronary artery without angina pectoris: Secondary | ICD-10-CM | POA: Diagnosis not present

## 2019-05-06 DIAGNOSIS — I10 Essential (primary) hypertension: Secondary | ICD-10-CM

## 2019-05-06 DIAGNOSIS — K219 Gastro-esophageal reflux disease without esophagitis: Secondary | ICD-10-CM | POA: Diagnosis not present

## 2019-05-06 DIAGNOSIS — E1151 Type 2 diabetes mellitus with diabetic peripheral angiopathy without gangrene: Secondary | ICD-10-CM | POA: Diagnosis not present

## 2019-05-06 DIAGNOSIS — I4891 Unspecified atrial fibrillation: Secondary | ICD-10-CM | POA: Diagnosis not present

## 2019-05-06 DIAGNOSIS — E7849 Other hyperlipidemia: Secondary | ICD-10-CM

## 2019-05-06 LAB — CBC WITH DIFFERENTIAL/PLATELET
Basophils Absolute: 0 10*3/uL (ref 0.0–0.1)
Basophils Relative: 0.3 % (ref 0.0–3.0)
Eosinophils Absolute: 0.1 10*3/uL (ref 0.0–0.7)
Eosinophils Relative: 1.3 % (ref 0.0–5.0)
HCT: 40.2 % (ref 39.0–52.0)
Hemoglobin: 13.9 g/dL (ref 13.0–17.0)
Lymphocytes Relative: 35.6 % (ref 12.0–46.0)
Lymphs Abs: 2.1 10*3/uL (ref 0.7–4.0)
MCHC: 34.5 g/dL (ref 30.0–36.0)
MCV: 100.1 fl — ABNORMAL HIGH (ref 78.0–100.0)
Monocytes Absolute: 0.5 10*3/uL (ref 0.1–1.0)
Monocytes Relative: 8.7 % (ref 3.0–12.0)
Neutro Abs: 3.2 10*3/uL (ref 1.4–7.7)
Neutrophils Relative %: 54.1 % (ref 43.0–77.0)
Platelets: 153 10*3/uL (ref 150.0–400.0)
RBC: 4.01 Mil/uL — ABNORMAL LOW (ref 4.22–5.81)
RDW: 15.1 % (ref 11.5–15.5)
WBC: 5.9 10*3/uL (ref 4.0–10.5)

## 2019-05-06 LAB — COMPREHENSIVE METABOLIC PANEL
ALT: 21 U/L (ref 0–53)
AST: 25 U/L (ref 0–37)
Albumin: 4.3 g/dL (ref 3.5–5.2)
Alkaline Phosphatase: 67 U/L (ref 39–117)
BUN: 18 mg/dL (ref 6–23)
CO2: 27 mEq/L (ref 19–32)
Calcium: 9.9 mg/dL (ref 8.4–10.5)
Chloride: 105 mEq/L (ref 96–112)
Creatinine, Ser: 1.03 mg/dL (ref 0.40–1.50)
GFR: 70.16 mL/min (ref >60.00)
Glucose, Bld: 105 mg/dL — ABNORMAL HIGH (ref 70–99)
Potassium: 4.5 mEq/L (ref 3.5–5.1)
Sodium: 140 mEq/L (ref 135–145)
Total Bilirubin: 0.8 mg/dL (ref 0.2–1.2)
Total Protein: 7.6 g/dL (ref 6.0–8.3)

## 2019-05-06 LAB — LIPID PANEL
Cholesterol: 120 mg/dL (ref 0–200)
HDL: 35.4 mg/dL — ABNORMAL LOW (ref >39.00)
LDL Cholesterol: 56 mg/dL (ref 0–99)
NonHDL: 84.7
Total CHOL/HDL Ratio: 3
Triglycerides: 146 mg/dL (ref 0.0–149.0)
VLDL: 29.2 mg/dL (ref 0.0–40.0)

## 2019-05-06 LAB — MICROALBUMIN / CREATININE URINE RATIO
Creatinine,U: 88.4 mg/dL
Microalb Creat Ratio: 1.4 mg/g (ref 0.0–30.0)
Microalb, Ur: 1.2 mg/dL (ref 0.0–1.9)

## 2019-05-06 LAB — HEMOGLOBIN A1C: Hgb A1c MFr Bld: 6.7 % — ABNORMAL HIGH (ref 4.6–6.5)

## 2019-05-06 NOTE — Assessment & Plan Note (Signed)
Asymptomatic, rate controlled Continue xarelto, metoprolol Cbc, cmp

## 2019-05-06 NOTE — Assessment & Plan Note (Signed)
Check a1c Low sugar / carb diet Stressed regular exercise Continue metformin, glimepiride, [place]

## 2019-05-06 NOTE — Assessment & Plan Note (Signed)
GERD controlled Continue daily medication  

## 2019-05-06 NOTE — Assessment & Plan Note (Signed)
BP Readings from Last 3 Encounters:  05/06/19 (!) 144/82  04/02/19 118/74  10/31/18 136/88    BP well controlled Current regimen effective and well tolerated Continue current medications at current doses cmp

## 2019-05-06 NOTE — Assessment & Plan Note (Signed)
Check lipid panel  Continue daily statin Regular exercise and healthy diet encouraged  

## 2019-05-06 NOTE — Assessment & Plan Note (Signed)
No concerning symptoms of angina Continue increased activity Continue current medications Cbc, cmp, lipid

## 2019-05-07 ENCOUNTER — Telehealth: Payer: Self-pay | Admitting: Internal Medicine

## 2019-05-07 ENCOUNTER — Encounter: Payer: Self-pay | Admitting: Internal Medicine

## 2019-05-07 NOTE — Telephone Encounter (Signed)
Caller name: Dech,Judy Relation to pt: spouse Call back number: [phone]    Reason for call:  Spouse checking on the status of xarelto samples, patient daughter will be in town and would like to pick up today.spouse requesting a call back from [facility], please advise

## 2019-05-07 NOTE — Telephone Encounter (Signed)
Wife informed patient can come and pick up samples

## 2019-05-26 ENCOUNTER — Encounter: Payer: Self-pay | Admitting: Internal Medicine

## 2019-05-30 ENCOUNTER — Ambulatory Visit (INDEPENDENT_AMBULATORY_CARE_PROVIDER_SITE_OTHER): Payer: HMO

## 2019-05-30 DIAGNOSIS — Z23 Encounter for immunization: Secondary | ICD-10-CM

## 2019-06-09 DIAGNOSIS — G245 Blepharospasm: Secondary | ICD-10-CM | POA: Diagnosis not present

## 2019-06-09 DIAGNOSIS — H26491 Other secondary cataract, right eye: Secondary | ICD-10-CM | POA: Diagnosis not present

## 2019-06-25 ENCOUNTER — Other Ambulatory Visit: Payer: Self-pay

## 2019-06-25 MED ORDER — METOPROLOL TARTRATE 50 MG PO TABS: 50.0000 mg | ORAL_TABLET | Freq: Two times a day (BID) | ORAL | 1 refills | Status: DC

## 2019-06-25 MED ORDER — SIMVASTATIN 40 MG PO TABS: 40.0000 mg | ORAL_TABLET | Freq: Every evening | ORAL | 1 refills | Status: DC

## 2019-06-25 MED ORDER — OMEPRAZOLE 20 MG PO CPDR: 20.0000 mg | DELAYED_RELEASE_CAPSULE | Freq: Every day | ORAL | 1 refills | Status: DC

## 2019-06-26 ENCOUNTER — Other Ambulatory Visit: Payer: Self-pay

## 2019-06-26 ENCOUNTER — Ambulatory Visit: Payer: Self-pay

## 2019-06-26 MED ORDER — METFORMIN HCL ER 500 MG PO TB24: ORAL_TABLET | ORAL | 1 refills | Status: DC

## 2019-06-26 MED ORDER — GLIMEPIRIDE 2 MG PO TABS: ORAL_TABLET | ORAL | 1 refills | Status: DC

## 2019-07-15 DIAGNOSIS — H18413 Arcus senilis, bilateral: Secondary | ICD-10-CM | POA: Diagnosis not present

## 2019-07-15 DIAGNOSIS — E119 Type 2 diabetes mellitus without complications: Secondary | ICD-10-CM | POA: Diagnosis not present

## 2019-07-15 DIAGNOSIS — Z961 Presence of intraocular lens: Secondary | ICD-10-CM | POA: Diagnosis not present

## 2019-07-15 DIAGNOSIS — G245 Blepharospasm: Secondary | ICD-10-CM | POA: Diagnosis not present

## 2019-07-31 ENCOUNTER — Other Ambulatory Visit: Payer: Self-pay

## 2019-07-31 NOTE — Patient Outreach (Signed)
  [facility]) Care Management Chronic Special Needs Program  07/31/2019  Name: Todd [name] DOB: June 27, 1941  MRN: [id number]  [name] is enrolled in a Chronic Special Needs Plan. RNCM called to follow up and review individualized care plan. No answer. HIPPA compliant message left.   Plan: Chronic care management coordinator will attempt outreach within 2-3 weeks.  Thea Silversmith, RN, MSN, [place] [phone]

## 2019-08-04 ENCOUNTER — Ambulatory Visit: Payer: Self-pay

## 2019-08-07 DIAGNOSIS — Z7189 Other specified counseling: Secondary | ICD-10-CM | POA: Insufficient documentation

## 2019-08-07 NOTE — Progress Notes (Signed)
Cardiology Office Note   Date:  08/08/2019   ID:  [name] Todd, DOB 11/09/1940, MRN [id number]  PCP:  Binnie Rail, MD  Cardiologist:   No primary care provider on file.   Chief Complaint  Patient presents with  . Coronary Artery Disease      History of Present Illness: Todd [name] is a 78 y.o. male who presents for follow up of CAD and atrial fibrillation.  In March of 2015 he had a stress perfusion study demonstrating no evidence of ischemia or infarct with preserved ejection fraction.  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  He does activities for the church.  The patient denies any new symptoms such as chest discomfort, neck or arm discomfort. There has been no new shortness of breath, PND or orthopnea. There have been no reported palpitations, presyncope or syncope.    Past Medical History:  Diagnosis Date  . Allergic rhinitis   . Asthma   . Atrial fibrillation with rapid ventricular response ([place])    a. newly diagnosed 11/03/13, spont conv to NSR, placed on xarelto.  Marland Kitchen CAD (coronary artery disease)    a. 1999; s/p CABG: LIMA to LAD, SVG to OM1, SVG to OM2, SVG to RCA  b. Normal nuc 10/2013 (done because of new onset AF.)  . Diabetes mellitus ([place])   . HLD (hyperlipidemia)   . HTN (hypertension)   . Nephrolithiasis   . Overweight(278.02)   . PVD (peripheral vascular disease) ([facility])    Carotid stenosis  . Stroke [facility])     Past Surgical History:  Procedure Laterality Date  . CAROTID ENDARTERECTOMY  1999  . COLONOSCOPY  2014   negative;Dr Sharlett Iles  . COLONOSCOPY W/ POLYPECTOMY  2009   Dr Sharlett Iles  . CORONARY ARTERY BYPASS GRAFT  Aug.1999  . HEMORRHOID BANDING    . INTRACAPSULAR CATARACT EXTRACTION Bilateral 2018  . NASAL SINUS SURGERY       Current Outpatient Medications  Medication Sig Dispense Refill  . blood glucose meter kit and supplies KIT Dispense based on patient and insurance preference. Use up to four times daily as directed. (FOR  E11.9). 1 each 0  . cetirizine (ZYRTEC) 10 MG tablet Take 10 mg by mouth daily as needed for allergies.     . Cholecalciferol (VITAMIN D-3) 5000 UNITS TABS Take 5,000 Units by mouth daily.     Marland Kitchen glimepiride (AMARYL) 2 MG tablet Take 1 tablet (2 mg total) daily. 90 tablet 1  . glucose 4 GM chewable tablet Chew 1 tablet by mouth as needed for low blood sugar.    Marland Kitchen glucose blood (ONE TOUCH ULTRA TEST) test strip Test blood sugar once daily 100 each 1  . metFORMIN (GLUCOPHAGE-XR) 500 MG 24 hr tablet Take 2 tablets (1,065m total) by mouth twice a day 360 tablet 1  . metoprolol tartrate (LOPRESSOR) 50 MG tablet Take 1 tablet (50 mg total) by mouth 2 (two) times daily. 180 tablet 1  . nitroGLYCERIN (NITROSTAT) 0.4 MG SL tablet Place 1 tablet (0.4 mg total) under the tongue every 5 (five) minutes as needed for chest pain. 100 tablet 3  . Omega-3 Fatty Acids (FISH OIL PO) Take 1 tablet by mouth daily.    .Marland Kitchenomeprazole (PRILOSEC) 20 MG capsule Take 1 capsule (20 mg total) by mouth daily. 90 capsule 1  . ONETOUCH DELICA LANCETS 392KMISC TEST BLOOD SUGAR ONCE DAILY 100 each 3  . polycarbophil (FIBERCON) 625 MG tablet  Take 625 mg by mouth 2 (two) times daily.     . simvastatin (ZOCOR) 40 MG tablet Take 1 tablet (40 mg total) by mouth every evening. 90 tablet 1  . sitaGLIPtin (JANUVIA) 100 MG tablet Take 1 tablet (100 mg total) by mouth daily. 90 tablet 1  . triamcinolone cream (KENALOG) 0.1 %   3  . XARELTO 20 MG TABS tablet Take 1 tablet by mouth daily with supper. 14 tablet 0   No current facility-administered medications for this visit.    Allergies:   Amlodipine besylate and Ivp dye [iodinated diagnostic agents]    ROS:  Please see the history of present illness.   Otherwise, review of systems are positive for none.   All other systems are reviewed and negative.    PHYSICAL EXAM: VS:  BP (!) 143/84   Pulse 74   Ht 5' 8"  (1.727 m)   Wt 196 lb (88.9 kg)   SpO2 98%   BMI 29.80 kg/m  , BMI Body  mass index is 29.8 kg/m. GENERAL:  Well appearing NECK:  No jugular venous distention, waveform within normal limits, carotid upstroke brisk and symmetric, no bruits, no thyromegaly LUNGS:  Few basilar crackles.   BACK:  No CVA tenderness CHEST:  Well healed sternotomy scar. HEART:  PMI not displaced or sustained,S1 and S2 within normal limits, no S3, no S4, no clicks, no rubs, no murmurs ABD:  Flat, positive bowel sounds normal in frequency in pitch, no bruits, no rebound, no guarding, no midline pulsatile mass, no hepatomegaly, no splenomegaly EXT:  2 plus pulses throughout, no edema, no cyanosis no clubbing   EKG:  EKG is ordered today. The ekg ordered today demonstrates sinus rhythm, rate 74, axis within normal limits, intervals within normal limits, no acute ST-T wave changes.   Recent Labs: 10/31/2018: TSH 4.25 05/06/2019: ALT 21; BUN 18; Creatinine, Ser 1.03; Hemoglobin 13.9; Platelets 153.0; Potassium 4.5; Sodium 140    Lipid Panel    Component Value Date/Time   CHOL 120 05/06/2019 0853   TRIG 146.0 05/06/2019 0853   TRIG 94 07/19/2006 0727   HDL 35.40 (L) 05/06/2019 0853   CHOLHDL 3 05/06/2019 0853   VLDL 29.2 05/06/2019 0853   LDLCALC 56 05/06/2019 0853      Wt Readings from Last 3 Encounters:  08/08/19 196 lb (88.9 kg)  05/06/19 194 lb (88 kg)  04/02/19 192 lb 9.6 oz (87.4 kg)      Other studies Reviewed: Additional studies/ records that were reviewed today include: Labs. Review of the above records demonstrates:  Please see elsewhere in the note.     ASSESSMENT AND PLAN:  PAROXYSMAL ATRIAL FIB - The patient tolerates anticoagulation. [name] has a CHA2DS2 - VASc score of 6 he will remain on anticoagulatoin.  He probably has some paroxysms but he is not in this over time.  No change in therapy.  CAD -  The patient has no new sypmtoms.  No further cardiovascular testing is indicated.  We will continue with aggressive risk reduction and meds as  listed.  CAROTID ARTERY DISEASE -  He had mild plaque in  in Jan 2019.  No further testing at this time   HYPERLIPIDEMIA -  LDL was 56 with an HDL of 35 in September.  I reviewed these labs.  Continue current meds.   OVERWEIGHT/OBESITY -   His weight has come down a little bit.  He understands he needs to lose a little bit  more.  HYPERTENSION - He reports his blood pressure is always well controlled at home.  He is to have goals of 120s over 70s and he understands this and thinks that he is achieving that most of the time but will let me know.   DM A1c is down to 6.7.  Continue current therapy.  COVID EDUCATION: We talked about the vaccine and he thinks he would want this when it is his turn.  Current medicines are reviewed at length with the patient today.  The patient does not have concerns regarding medicines.  The following changes have been made:  no change  Labs/ tests ordered today include: None  Orders Placed This Encounter  Procedures  . EKG 12-Lead     Disposition:   FU with 12 months.     Signed, Minus Breeding, MD  08/08/2019 9:49 AM    [facility]

## 2019-08-08 ENCOUNTER — Other Ambulatory Visit: Payer: Self-pay

## 2019-08-08 ENCOUNTER — Encounter: Payer: Self-pay | Admitting: Cardiology

## 2019-08-08 ENCOUNTER — Ambulatory Visit: Payer: HMO | Admitting: Cardiology

## 2019-08-08 ENCOUNTER — Telehealth: Payer: Self-pay

## 2019-08-08 VITALS — BP 143/84 | HR 74 | Ht 68.0 in | Wt 196.0 lb

## 2019-08-08 DIAGNOSIS — E663 Overweight: Secondary | ICD-10-CM

## 2019-08-08 DIAGNOSIS — E669 Obesity, unspecified: Secondary | ICD-10-CM | POA: Diagnosis not present

## 2019-08-08 DIAGNOSIS — I10 Essential (primary) hypertension: Secondary | ICD-10-CM | POA: Diagnosis not present

## 2019-08-08 DIAGNOSIS — Z7189 Other specified counseling: Secondary | ICD-10-CM

## 2019-08-08 DIAGNOSIS — E118 Type 2 diabetes mellitus with unspecified complications: Secondary | ICD-10-CM

## 2019-08-08 DIAGNOSIS — I6529 Occlusion and stenosis of unspecified carotid artery: Secondary | ICD-10-CM | POA: Diagnosis not present

## 2019-08-08 DIAGNOSIS — E119 Type 2 diabetes mellitus without complications: Secondary | ICD-10-CM

## 2019-08-08 DIAGNOSIS — I2510 Atherosclerotic heart disease of native coronary artery without angina pectoris: Secondary | ICD-10-CM | POA: Diagnosis not present

## 2019-08-08 DIAGNOSIS — E785 Hyperlipidemia, unspecified: Secondary | ICD-10-CM

## 2019-08-08 DIAGNOSIS — Z7901 Long term (current) use of anticoagulants: Secondary | ICD-10-CM

## 2019-08-08 DIAGNOSIS — I480 Paroxysmal atrial fibrillation: Secondary | ICD-10-CM | POA: Diagnosis not present

## 2019-08-08 MED ORDER — XARELTO 20 MG PO TABS: ORAL_TABLET | ORAL | 0 refills | Status: DC

## 2019-08-08 NOTE — Patient Instructions (Signed)
Medication Instructions:  Your physician recommends that you continue on your current medications as directed. Please refer to the Current Medication list given to you today.  *If you need a refill on your cardiac medications before your next appointment, please call your pharmacy*  Lab Work: NONE If you have labs (blood work) drawn today and your tests are completely normal, you will receive your results only by: Marland Kitchen MyChart Message (if you have MyChart) OR . A paper copy in the mail If you have any lab test that is abnormal or we need to change your treatment, we will call you to review the results.  Testing/Procedures: NONE  Follow-Up: At [facility], you and your health needs are our priority.  As part of our continuing mission to provide you with exceptional heart care, we have created designated Provider Care Teams.  These Care Teams include your primary Cardiologist (physician) and Advanced Practice Providers (APPs -  Physician Assistants and Nurse Practitioners) who all work together to provide you with the care you need, when you need it.  Your next appointment:   12 month(s)  The format for your next appointment:   Either In Person or Virtual  Provider:   Minus Breeding, MD

## 2019-08-08 NOTE — Telephone Encounter (Signed)
Contacted pt to confirm that he would present for appt today. LMTCB. Pt arrived at the office shortly after

## 2019-08-19 ENCOUNTER — Other Ambulatory Visit: Payer: Self-pay

## 2019-08-19 NOTE — Patient Outreach (Signed)
  [facility]) Care Management Chronic Special Needs Program    08/19/2019  Name: Todd [name], DOB: 12/04/1940  MRN: [id number]   Mr. Benino Hed is enrolled in a chronic special needs plan. RNCM called to follow up and review individualized care plan. No answer. HIPAA compliant message left. 2nd outreach attempt.  Plan: Chronic care management coordinator will attempt outreach within 2-3 weeks.   Thea Silversmith, RN, MSN, [place] [phone]

## 2019-08-20 ENCOUNTER — Other Ambulatory Visit: Payer: Self-pay

## 2019-08-20 DIAGNOSIS — M79644 Pain in right finger(s): Secondary | ICD-10-CM | POA: Diagnosis not present

## 2019-08-20 DIAGNOSIS — M79641 Pain in right hand: Secondary | ICD-10-CM | POA: Diagnosis not present

## 2019-08-20 NOTE — Patient Outreach (Signed)
  [place] [facility]) Care Management Chronic Special Needs Program  08/20/2019  Name: Todd [name] DOB: 24-Oct-1940  MRN: [id number]  [name] is enrolled in a chronic special needs plan.RNCM returned call to follow up and review individualized care plan. No answer. HIPAA compliant message left. 3rd outreach attempt. Per policy and procedure, RNCM will update individualized care plan based on available data.   Goals Addressed            This Visit's Progress   . <enter goal here> (pt-stated)   On track    Hx of exercise in gym; Walks out in yard Goal weight is 190;  Continue goal of no white; plant fat    . Client understands the importance of follow-up with providers by attending scheduled visits   On track   . COMPLETED: Client verbalizes knowledge of Heart Attack self management skills within the next 6-9 months.       08/08/2019 cardiology visit.    . Client will not report change from baseline and no repeated symptoms of stroke with in the next 6-9 months    On track   . COMPLETED: Client will report no worsening of symptoms of Atrial Fibrillation within the next 6-9 months       08/08/2019 cardiology visit    . COMPLETED: Client will report no worsening of symptoms related to heart disease within the next 6-9 months.        08/08/2019 cardiology visit.    . COMPLETED: Client will verbalize knowledge of chronic lung disease as evidenced by no ED visits or Inpatient stays related to chronic lung disease        No ED visits.    . COMPLETED: HEMOGLOBIN A1C < 7       A1C 6.7 on 05/06/2019    . Maintain timely refills of diabetic medication as prescribed within the year .   On track   . COMPLETED: Obtain annual  Lipid Profile, LDL-C       Done 05/06/2019    . COMPLETED: Obtain Annual Eye (retinal)  Exam        Done 04/08/2019    . COMPLETED: Obtain Annual Foot Exam       Done 10/31/2018    . COMPLETED: Obtain annual screen for micro albuminuria (urine) ,  nephropathy (kidney problems)       Done 05/06/2019    . COMPLETED: Obtain Hemoglobin A1C at least 2 times per year       Done 10/31/2018 and 05/06/2019    . Patient Stated   On track    I would like to get back to going to the gym. I will just go to the gym 3 times a week.    . COMPLETED: Visit Primary Care Provider or Endocrinologist at least 2 times per year        Done 10/31/2018 and 05/06/2019       Plan: RNCM will send updated care plan to client; send updated care plan to primary care. RNCM will follow up based on tier level within 9-12 months or sooner as indicated.     Thea Silversmith, RN, MSN, [place] [phone]    .

## 2019-08-21 ENCOUNTER — Other Ambulatory Visit: Payer: Self-pay

## 2019-08-21 NOTE — Patient Outreach (Signed)
  [place] [facility]) Care Management Chronic Special Needs Program  08/21/2019  Name: Todd [name] DOB: Dec 27, 1940  MRN: [id number]  [name] is enrolled in a chronic special needs plan for Diabetes. Reviewed and updated care plan.  RNCM returned call to client's wife. Client hard of hearing and Todd [name] is designated party release. Todd [name] reports client has seen providers as scheduled. Denies any dificulty obtaining medications A1C 6.7 on 05/06/2019. Is without questions or concerns.   Goals Addressed            This Visit's Progress   . <enter goal here> (pt-stated)   On track    Goal weight is 190:  Continue to be as active as possible, continue to eat a nutritious well balanced diet.    . COMPLETED: Client understands the importance of follow-up with providers by attending scheduled visits   On track    Voiced understanding of importance of attending provider visits.    . COMPLETED: Client will not report change from baseline and no repeated symptoms of stroke with in the next 6-9 months        Denies any signs or symptoms.    . COMPLETED: Maintain timely refills of diabetic medication as prescribed within the year .       Denies any difficulty with obtaining medications.    . Patient Stated   On track    I would like to get back to going to the gym. I will just go to the gym 3 times a week.      Covid 19 precautions discussed. RNCM encouraged client to call  24 hour nurse advice line as needed. [employer] healthcare concierge is available for benefits questions. RNCM encouraged client/caregiver to call RNCM as needed.  Plan:  RNCM will follow up per tier level within 9-12 months.   Thea Silversmith, RN, MSN, [place] [phone]   .

## 2019-09-08 ENCOUNTER — Ambulatory Visit: Payer: Self-pay

## 2019-09-08 ENCOUNTER — Other Ambulatory Visit: Payer: Self-pay | Admitting: Cardiology

## 2019-09-08 NOTE — Telephone Encounter (Signed)
*  STAT* If patient is at the pharmacy, call can be transferred to refill team.   1. Which medications need to be refilled? (please list name of each medication and dose if known) XARELTO 20 MG TABS tablet  2. Which pharmacy/location (including street and city if local pharmacy) is medication to be sent to? [place], [place]  3. Do they need a 30 day or 90 day supply? 90 day

## 2019-09-09 ENCOUNTER — Other Ambulatory Visit: Payer: Self-pay

## 2019-09-09 DIAGNOSIS — M25531 Pain in right wrist: Secondary | ICD-10-CM | POA: Diagnosis not present

## 2019-09-09 MED ORDER — XARELTO 20 MG PO TABS: ORAL_TABLET | ORAL | 1 refills | Status: DC

## 2019-09-09 NOTE — Telephone Encounter (Signed)
Wife of the patient called and states she sent a request for both her and her husband to get their xarelto at [facility]. The wife states that her medication is ready  but her Husband's was not. They had both been getting their medication via mail order, but with how slow the mail has been they have decided to get it in person.    The patient only has two days left of medication

## 2019-09-11 ENCOUNTER — Other Ambulatory Visit: Payer: Self-pay | Admitting: Cardiology

## 2019-09-11 NOTE — Telephone Encounter (Signed)
 *  STAT* If patient is at the pharmacy, call can be transferred to refill team.   1. Which medications need to be refilled? (please list name of each medication and dose if known) XARELTO 20 MG TABS tablet  2. Which pharmacy/location (including street and city if local pharmacy) is medication to be sent to? [facility]  3. Do they need a 30 day or 90 day supply? 90 day   Completely out of medication at this time. This is the third request for the refill per patient

## 2019-09-11 NOTE — Telephone Encounter (Signed)
Please review for refill on Xarelto.

## 2019-10-09 ENCOUNTER — Other Ambulatory Visit: Payer: Self-pay | Admitting: Cardiology

## 2019-10-09 NOTE — Telephone Encounter (Signed)
New Message      *STAT* If patient is at the pharmacy, call can be transferred to refill team.   1. Which medications need to be refilled? (please list name of each medication and dose if known) simvastatin (ZOCOR) 40 MG tablet metoprolol tartrate (LOPRESSOR) 50 MG tablet omeprazole (PRILOSEC) 20 MG capsule      2. Which pharmacy/location (including street and city if local pharmacy) is medication to be sent to? [facility], [place]  3. Do they need a 30 day or 90 day supply? [place]

## 2019-10-10 ENCOUNTER — Telehealth: Payer: Self-pay | Admitting: Internal Medicine

## 2019-10-10 MED ORDER — OMEPRAZOLE 20 MG PO CPDR: 20.0000 mg | DELAYED_RELEASE_CAPSULE | Freq: Every day | ORAL | 2 refills | Status: DC

## 2019-10-10 MED ORDER — METFORMIN HCL ER 500 MG PO TB24: ORAL_TABLET | ORAL | 0 refills | Status: DC

## 2019-10-10 MED ORDER — GLIMEPIRIDE 2 MG PO TABS: ORAL_TABLET | ORAL | 0 refills | Status: DC

## 2019-10-10 MED ORDER — METOPROLOL TARTRATE 50 MG PO TABS: 50.0000 mg | ORAL_TABLET | Freq: Two times a day (BID) | ORAL | 2 refills | Status: DC

## 2019-10-10 MED ORDER — SIMVASTATIN 40 MG PO TABS: 40.0000 mg | ORAL_TABLET | Freq: Every evening | ORAL | 2 refills | Status: DC

## 2019-10-10 NOTE — Telephone Encounter (Signed)
Appt is needed.  30 day supply sent.

## 2019-10-10 NOTE — Telephone Encounter (Signed)
        1. Which medications need to be refilled? (please list name of each medication and dose if known)  glimepiride (AMARYL) 2 MG tablet metFORMIN (GLUCOPHAGE-XR) 500 MG 24 hr tablet  2. Which pharmacy/location (including street and city if local pharmacy) is medication to be sent to?[facility], [place]  3. Do they need a 30 day or 90 day supply? [place]

## 2019-10-10 NOTE — Telephone Encounter (Signed)
Follow up   Pts wife is calling back about refill    Please advise

## 2019-10-14 DIAGNOSIS — M79644 Pain in right finger(s): Secondary | ICD-10-CM | POA: Diagnosis not present

## 2019-10-21 DIAGNOSIS — G245 Blepharospasm: Secondary | ICD-10-CM | POA: Diagnosis not present

## 2019-11-04 NOTE — Progress Notes (Signed)
Subjective:    Patient ID: Todd [name], male    DOB: 09-20-1940, 79 y.o.   MRN: [id number]  HPI He is here for a physical exam.   Right hand pain and weakness.   He is following with orthopedics and has follow up next week.   He is not exercising regularly.  Sugar has been less than 150 - usually less than 120.    Medications and allergies reviewed with patient and updated if appropriate.  Patient Active Problem List   Diagnosis Date Noted  . Educated about COVID-19 virus infection 08/07/2019  . GERD (gastroesophageal reflux disease) 05/06/2019  . Pulmonary fibrosis ([place]) 05/05/2019  . Asbestosis ([place]) 05/05/2019  . Ventral hernia without obstruction or gangrene 10/31/2018  . Umbilical hernia without obstruction or gangrene 10/31/2018  . Eczema 10/29/2017  . Hives 08/13/2017  . Cough 12/04/2016  . 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 Hemorrhoids, internal, with bleeding 01/15/2015  . Bursitis of left shoulder 01/14/2015  . Type 2 diabetes, controlled, with peripheral circulatory disorder ([place]) 12/05/2013  . PVD (peripheral vascular disease) ([place]) 11/12/2013  . Atrial fibrillation ([place]) 11/03/2013  . Carotid artery disease ([place]) 12/20/2009  . OVERWEIGHT/OBESITY 11/17/2008  . Hyperlipidemia 07/20/2008  . Essential hypertension 07/20/2008  . Coronary atherosclerosis 07/20/2008  . ALLERGIC RHINITIS 07/20/2008  . NEPHROLITHIASIS, HX OF 07/20/2008    Current Outpatient Medications on File Prior to Visit  Medication Sig Dispense Refill  . blood glucose meter kit and supplies KIT Dispense based on patient and insurance preference. Use up to four times daily as directed. (FOR E11.9). 1 each 0  . cetirizine (ZYRTEC) 10 MG tablet Take 10 mg by mouth daily as needed for allergies.     . Cholecalciferol (VITAMIN D-3) 5000 UNITS TABS Take 5,000 Units by mouth daily.     Marland Kitchen glimepiride (AMARYL) 2 MG tablet Take 1 tablet (2 mg total) daily. 30 tablet 0  . glucose 4 GM chewable tablet  Chew 1 tablet by mouth as needed for low blood sugar.    Marland Kitchen glucose blood (ONE TOUCH ULTRA TEST) test strip Test blood sugar once daily 100 each 1  . metFORMIN (GLUCOPHAGE-XR) 500 MG 24 hr tablet Take 2 tablets (1,030m total) by mouth twice a day 60 tablet 0  . metoprolol tartrate (LOPRESSOR) 50 MG tablet Take 1 tablet (50 mg total) by mouth 2 (two) times daily. 180 tablet 2  . nitroGLYCERIN (NITROSTAT) 0.4 MG SL tablet Place 1 tablet (0.4 mg total) under the tongue every 5 (five) minutes as needed for chest pain. 100 tablet 3  . Omega-3 Fatty Acids (FISH OIL PO) Take 1 tablet by mouth daily.    .Marland Kitchenomeprazole (PRILOSEC) 20 MG capsule Take 1 capsule (20 mg total) by mouth daily. 90 capsule 2  . ONETOUCH DELICA LANCETS 382NMISC TEST BLOOD SUGAR ONCE DAILY 100 each 3  . polycarbophil (FIBERCON) 625 MG tablet Take 625 mg by mouth 2 (two) times daily.     . rivaroxaban (XARELTO) 20 MG TABS tablet TAKE 1 TABLET (20 MG TOTAL) BY MOUTH DAILY WITH SUPPER. 90 tablet 0  . simvastatin (ZOCOR) 40 MG tablet Take 1 tablet (40 mg total) by mouth every evening. 90 tablet 2  . sitaGLIPtin (JANUVIA) 100 MG tablet Take 1 tablet (100 mg total) by mouth daily. 90 tablet 1  . triamcinolone cream (KENALOG) 0.1 %   3   No current facility-administered medications on file prior to visit.    Past Medical  History:  Diagnosis Date  . Allergic rhinitis   . Asthma   . Atrial fibrillation with rapid ventricular response ([place])    a. newly diagnosed 11/03/13, spont conv to NSR, placed on xarelto.  Marland Kitchen CAD (coronary artery disease)    a. 1999; s/p CABG: LIMA to LAD, SVG to OM1, SVG to OM2, SVG to RCA  b. Normal nuc 10/2013 (done because of new onset AF.)  . Diabetes mellitus ([place])   . HLD (hyperlipidemia)   . HTN (hypertension)   . Nephrolithiasis   . Overweight(278.02)   . PVD (peripheral vascular disease) ([facility])    Carotid stenosis  . Stroke [facility])     Past Surgical History:  Procedure Laterality Date  . CAROTID  ENDARTERECTOMY  1999  . COLONOSCOPY  2014   negative;Dr Sharlett Iles  . COLONOSCOPY W/ POLYPECTOMY  2009   Dr Sharlett Iles  . CORONARY ARTERY BYPASS GRAFT  Aug.1999  . HEMORRHOID BANDING    . INTRACAPSULAR CATARACT EXTRACTION Bilateral 2018  . NASAL SINUS SURGERY      Social History   Socioeconomic History  . Marital status: Married    Spouse name: [name]  . Number of children: 2  . Years of education: Not on file  . Highest education level: Not on file  Occupational History  . Occupation: Sales Manager-retired  Tobacco Use  . Smoking status: Never Smoker  . Smokeless tobacco: Never Used  Substance and Sexual Activity  . Alcohol use: No    Alcohol/week: 0.0 standard drinks  . Drug use: No  . Sexual activity: Yes  Other Topics Concern  . Not on file  Social History Narrative  . Not on file   Social Determinants of Health   Financial Resource Strain:   . Difficulty of Paying Living Expenses:   Food Insecurity:   . Worried About Charity fundraiser in the Last Year:   . Arboriculturist in the Last Year:   Transportation Needs:   . Film/video editor (Medical):   Marland Kitchen Lack of Transportation (Non-Medical):   Physical Activity:   . Days of Exercise per Week:   . Minutes of Exercise per Session:   Stress:   . Feeling of Stress :   Social Connections:   . Frequency of Communication with Friends and Family:   . Frequency of Social Gatherings with Friends and Family:   . Attends Religious Services:   . Active Member of Clubs or Organizations:   . Attends Archivist Meetings:   Marland Kitchen Marital Status:     Family History  Problem Relation Age of Onset  . Prostate cancer Brother   . Hemochromatosis Brother   . Colon cancer Brother 50  . Heart attack Brother 39  . Asthma Sister   . Heart attack Mother 42  . Heart attack Brother 11  . Diabetes Father        IDDM  . Stroke Father 39    Review of Systems  Constitutional: Negative for chills and fever.  Eyes:  Negative for visual disturbance.  Respiratory: Positive for shortness of breath (with walking - chronic, no change). Negative for cough and wheezing.   Cardiovascular: Positive for leg swelling (rare). Negative for chest pain and palpitations.  Gastrointestinal: Negative for abdominal pain, blood in stool, constipation, diarrhea and nausea.       No gerd  Genitourinary: Negative for difficulty urinating, dysuria and hematuria.  Musculoskeletal: Positive for arthralgias (right hand pain, weakness). Negative for back pain.  Skin: Negative for color change and rash.  Neurological: Negative for light-headedness and headaches.  Hematological: Does not bruise/bleed easily.  Psychiatric/Behavioral: Negative for dysphoric mood. The patient is not nervous/anxious.        Objective:   Vitals:   11/05/19 0752  BP: 136/80  Pulse: 81  Resp: 16  Temp: 98.5 F (36.9 C)  SpO2: 99%   Filed Weights   11/05/19 0752  Weight: 195 lb 9.6 oz (88.7 kg)   Body mass index is 29.74 kg/m.  BP Readings from Last 3 Encounters:  11/05/19 136/80  08/08/19 (!) 143/84  05/06/19 (!) 144/82    Wt Readings from Last 3 Encounters:  11/05/19 195 lb 9.6 oz (88.7 kg)  08/08/19 196 lb (88.9 kg)  05/06/19 194 lb (88 kg)     Physical Exam Constitutional: He appears well-developed and well-nourished. No distress.  HENT:  Head: Normocephalic and atraumatic.  Right Ear: External ear normal.  Left Ear: External ear normal.  Mouth/Throat: Oropharynx is clear and moist.  Normal ear canals and TM b/l  Eyes: Conjunctivae and EOM are normal.  Neck: Neck supple. No tracheal deviation present. No thyromegaly present.  No carotid bruit  Cardiovascular: Normal rate, regular rhythm, normal heart sounds and intact distal pulses.   No murmur heard. Pulmonary/Chest: Effort normal and breath sounds normal. No respiratory distress. He has no wheezes. He has no rales.  Abdominal: Soft. He exhibits no distension. There is  no tenderness.  Genitourinary: deferred  Musculoskeletal: He exhibits no edema.  Lymphadenopathy:   He has no cervical adenopathy.  Skin: Skin is warm and dry. He is not diaphoretic.  Psychiatric: He has a normal mood and affect. His behavior is normal.         Assessment & Plan:   Physical exam: Screening blood work  ordered Immunizations  Up to date Colonoscopy   N/a due to age Eye exams   Up to date  Exercise   Not regular - walks in nicer weather Weight  Overweight - ok for age Substance abuse   none  See Problem List for Assessment and Plan of chronic medical problems.    This visit occurred during the SARS-CoV-2 public health emergency.  Safety protocols were in place, including screening questions prior to the visit, additional usage of staff PPE, and extensive cleaning of exam room while observing appropriate contact time as indicated for disinfecting solutions.

## 2019-11-04 NOTE — Patient Instructions (Addendum)
Blood work was ordered.    All other Health Maintenance issues reviewed.   All recommended immunizations and age-appropriate screenings are up-to-date or discussed.  No immunization administered today.   Medications reviewed and updated.  Changes include :   none    Please followup in 6 months    Health Maintenance, Male Adopting a healthy lifestyle and getting preventive care are important in promoting health and wellness. Ask your health care provider about:  The right schedule for you to have regular tests and exams.  Things you can do on your own to prevent diseases and keep yourself healthy. What should I know about diet, weight, and exercise? Eat a healthy diet   Eat a diet that includes plenty of vegetables, fruits, low-fat dairy products, and lean protein.  Do not eat a lot of foods that are high in solid fats, added sugars, or sodium. Maintain a healthy weight Body mass index (BMI) is a measurement that can be used to identify possible weight problems. It estimates body fat based on height and weight. Your health care provider can help determine your BMI and help you achieve or maintain a healthy weight. Get regular exercise Get regular exercise. This is one of the most important things you can do for your health. Most adults should:  Exercise for at least 150 minutes each week. The exercise should increase your heart rate and make you sweat (moderate-intensity exercise).  Do strengthening exercises at least twice a week. This is in addition to the moderate-intensity exercise.  Spend less time sitting. Even light physical activity can be beneficial. Watch cholesterol and blood lipids Have your blood tested for lipids and cholesterol at 77 years of age, then have this test every 5 years. You may need to have your cholesterol levels checked more often if:  Your lipid or cholesterol levels are high.  You are older than 77 years of age.  You are at high risk for  heart disease. What should I know about cancer screening? Many types of cancers can be detected early and may often be prevented. Depending on your health history and family history, you may need to have cancer screening at various ages. This may include screening for:  Colorectal cancer.  Prostate cancer.  Skin cancer.  Lung cancer. What should I know about heart disease, diabetes, and high blood pressure? Blood pressure and heart disease  High blood pressure causes heart disease and increases the risk of stroke. This is more likely to develop in people who have high blood pressure readings, are of African descent, or are overweight.  Talk with your health care provider about your target blood pressure readings.  Have your blood pressure checked: ? Every 3-5 years if you are 18-39 years of age. ? Every year if you are 40 years old or older.  If you are between the ages of 65 and 75 and are a current or former smoker, ask your health care provider if you should have a one-time screening for abdominal aortic aneurysm (AAA). Diabetes Have regular diabetes screenings. This checks your fasting blood sugar level. Have the screening done:  Once every three years after age 45 if you are at a normal weight and have a low risk for diabetes.  More often and at a younger age if you are overweight or have a high risk for diabetes. What should I know about preventing infection? Hepatitis B If you have a higher risk for hepatitis B, you should be screened for   this virus. Talk with your health care provider to find out if you are at risk for hepatitis B infection. Hepatitis C Blood testing is recommended for:  Everyone born from 1945 through 1965.  Anyone with known risk factors for hepatitis C. Sexually transmitted infections (STIs)  You should be screened each year for STIs, including gonorrhea and chlamydia, if: ? You are sexually active and are younger than 77 years of age. ? You are  older than 77 years of age and your health care provider tells you that you are at risk for this type of infection. ? Your sexual activity has changed since you were last screened, and you are at increased risk for chlamydia or gonorrhea. Ask your health care provider if you are at risk.  Ask your health care provider about whether you are at high risk for HIV. Your health care provider may recommend a prescription medicine to help prevent HIV infection. If you choose to take medicine to prevent HIV, you should first get tested for HIV. You should then be tested every 3 months for as long as you are taking the medicine. Follow these instructions at home: Lifestyle  Do not use any products that contain nicotine or tobacco, such as cigarettes, e-cigarettes, and chewing tobacco. If you need help quitting, ask your health care provider.  Do not use street drugs.  Do not share needles.  Ask your health care provider for help if you need support or information about quitting drugs. Alcohol use  Do not drink alcohol if your health care provider tells you not to drink.  If you drink alcohol: ? Limit how much you have to 0-2 drinks a day. ? Be aware of how much alcohol is in your drink. In the [place] one drink equals one 12 oz bottle of beer (355 mL), one 5 oz glass of wine (148 mL), or one 1 oz glass of hard liquor (44 mL). General instructions  Schedule regular health, dental, and eye exams.  Stay current with your vaccines.  Tell your health care provider if: ? You often feel depressed. ? You have ever been abused or do not feel safe at home. Summary  Adopting a healthy lifestyle and getting preventive care are important in promoting health and wellness.  Follow your health care provider's instructions about healthy diet, exercising, and getting tested or screened for diseases.  Follow your health care provider's instructions on monitoring your cholesterol and blood pressure. This  information is not intended to replace advice given to you by your health care provider. Make sure you discuss any questions you have with your health care provider. Document Revised: 07/31/2018 Document Reviewed: 07/31/2018 Elsevier Patient Education  2020 Elsevier Inc.  

## 2019-11-05 ENCOUNTER — Other Ambulatory Visit: Payer: Self-pay

## 2019-11-05 ENCOUNTER — Encounter: Payer: Self-pay | Admitting: Internal Medicine

## 2019-11-05 ENCOUNTER — Ambulatory Visit (INDEPENDENT_AMBULATORY_CARE_PROVIDER_SITE_OTHER): Payer: HMO | Admitting: Internal Medicine

## 2019-11-05 VITALS — BP 136/80 | HR 81 | Temp 98.5°F | Resp 16 | Ht 68.0 in | Wt 195.6 lb

## 2019-11-05 DIAGNOSIS — Z0000 Encounter for general adult medical examination without abnormal findings: Secondary | ICD-10-CM | POA: Diagnosis not present

## 2019-11-05 DIAGNOSIS — I4891 Unspecified atrial fibrillation: Secondary | ICD-10-CM | POA: Diagnosis not present

## 2019-11-05 DIAGNOSIS — K219 Gastro-esophageal reflux disease without esophagitis: Secondary | ICD-10-CM

## 2019-11-05 DIAGNOSIS — E1151 Type 2 diabetes mellitus with diabetic peripheral angiopathy without gangrene: Secondary | ICD-10-CM

## 2019-11-05 DIAGNOSIS — E7849 Other hyperlipidemia: Secondary | ICD-10-CM | POA: Diagnosis not present

## 2019-11-05 DIAGNOSIS — I10 Essential (primary) hypertension: Secondary | ICD-10-CM | POA: Diagnosis not present

## 2019-11-05 DIAGNOSIS — I2510 Atherosclerotic heart disease of native coronary artery without angina pectoris: Secondary | ICD-10-CM | POA: Diagnosis not present

## 2019-11-05 LAB — COMPREHENSIVE METABOLIC PANEL
ALT: 18 U/L (ref 0–53)
AST: 18 U/L (ref 0–37)
Albumin: 4 g/dL (ref 3.5–5.2)
Alkaline Phosphatase: 70 U/L (ref 39–117)
BUN: 17 mg/dL (ref 6–23)
CO2: 28 mEq/L (ref 19–32)
Calcium: 9.5 mg/dL (ref 8.4–10.5)
Chloride: 104 mEq/L (ref 96–112)
Creatinine, Ser: 1.04 mg/dL (ref 0.40–1.50)
GFR: 69.29 mL/min (ref >60.00)
Glucose, Bld: 171 mg/dL — ABNORMAL HIGH (ref 70–99)
Potassium: 4.4 mEq/L (ref 3.5–5.1)
Sodium: 139 mEq/L (ref 135–145)
Total Bilirubin: 0.7 mg/dL (ref 0.2–1.2)
Total Protein: 7.5 g/dL (ref 6.0–8.3)

## 2019-11-05 LAB — LIPID PANEL
Cholesterol: 115 mg/dL (ref 0–200)
HDL: 33 mg/dL — ABNORMAL LOW (ref >39.00)
LDL Cholesterol: 55 mg/dL (ref 0–99)
NonHDL: 81.5
Total CHOL/HDL Ratio: 3
Triglycerides: 131 mg/dL (ref 0.0–149.0)
VLDL: 26.2 mg/dL (ref 0.0–40.0)

## 2019-11-05 LAB — CBC WITH DIFFERENTIAL/PLATELET
Basophils Absolute: 0 10*3/uL (ref 0.0–0.1)
Basophils Relative: 0.2 % (ref 0.0–3.0)
Eosinophils Absolute: 0.1 10*3/uL (ref 0.0–0.7)
Eosinophils Relative: 1.1 % (ref 0.0–5.0)
HCT: 39.7 % (ref 39.0–52.0)
Hemoglobin: 13.8 g/dL (ref 13.0–17.0)
Lymphocytes Relative: 28.8 % (ref 12.0–46.0)
Lymphs Abs: 2.2 10*3/uL (ref 0.7–4.0)
MCHC: 34.7 g/dL (ref 30.0–36.0)
MCV: 101.2 fl — ABNORMAL HIGH (ref 78.0–100.0)
Monocytes Absolute: 0.8 10*3/uL (ref 0.1–1.0)
Monocytes Relative: 11.2 % (ref 3.0–12.0)
Neutro Abs: 4.4 10*3/uL (ref 1.4–7.7)
Neutrophils Relative %: 58.7 % (ref 43.0–77.0)
Platelets: 167 10*3/uL (ref 150.0–400.0)
RBC: 3.92 Mil/uL — ABNORMAL LOW (ref 4.22–5.81)
RDW: 14.2 % (ref 11.5–15.5)
WBC: 7.6 10*3/uL (ref 4.0–10.5)

## 2019-11-05 LAB — HEMOGLOBIN A1C: Hgb A1c MFr Bld: 8.3 % — ABNORMAL HIGH (ref 4.6–6.5)

## 2019-11-05 LAB — TSH: TSH: 4.35 u[IU]/mL (ref 0.35–4.50)

## 2019-11-05 NOTE — Assessment & Plan Note (Signed)
Chronic GERD controlled Continue daily medication-omeprazole 20 mg daily  

## 2019-11-05 NOTE — Assessment & Plan Note (Signed)
Chronic No concerning symptoms of angina Following with cardiology Continue current medications 

## 2019-11-05 NOTE — Assessment & Plan Note (Signed)
chronic Following with cardiology On Xarelto, metoprolol Rate controlled, asymptomatic CBC, CMP, TSH

## 2019-11-05 NOTE — Assessment & Plan Note (Signed)
Lab Results  Component Value Date   HGBA1C 6.7 (H) 05/06/2019   Chronic Check a1c Low sugar / carb diet Stressed regular exercise Continue current medications - ideally should be jardiance / farxiga but these are not covered by insurance

## 2019-11-05 NOTE — Assessment & Plan Note (Signed)
Chronic BP well controlled Current regimen effective and well tolerated Continue current medications at current doses cmp  

## 2019-11-05 NOTE — Assessment & Plan Note (Signed)
Chronic Check lipid panel  Continue daily statin Regular exercise and healthy diet encouraged  

## 2019-11-06 ENCOUNTER — Encounter: Payer: Self-pay | Admitting: Internal Medicine

## 2019-11-06 ENCOUNTER — Other Ambulatory Visit: Payer: Self-pay | Admitting: Internal Medicine

## 2019-11-06 MED ORDER — GLIMEPIRIDE 4 MG PO TABS: 4.0000 mg | ORAL_TABLET | Freq: Every day | ORAL | 1 refills | Status: DC

## 2019-11-11 DIAGNOSIS — M79644 Pain in right finger(s): Secondary | ICD-10-CM | POA: Diagnosis not present

## 2019-12-09 ENCOUNTER — Other Ambulatory Visit: Payer: Self-pay | Admitting: Internal Medicine

## 2019-12-09 DIAGNOSIS — M1811 Unilateral primary osteoarthritis of first carpometacarpal joint, right hand: Secondary | ICD-10-CM | POA: Diagnosis not present

## 2019-12-17 ENCOUNTER — Other Ambulatory Visit: Payer: Self-pay | Admitting: Internal Medicine

## 2020-01-08 DIAGNOSIS — M1811 Unilateral primary osteoarthritis of first carpometacarpal joint, right hand: Secondary | ICD-10-CM | POA: Diagnosis not present

## 2020-01-08 DIAGNOSIS — G5601 Carpal tunnel syndrome, right upper limb: Secondary | ICD-10-CM | POA: Diagnosis not present

## 2020-01-09 ENCOUNTER — Telehealth: Payer: Self-pay

## 2020-01-09 NOTE — Telephone Encounter (Signed)
   [facility] Pre-operative Risk Assessment     Request for surgical clearance:  1. What type of surgery is being performed? Right Endoscopic Carpal Tunnel Release    2. When is this surgery scheduled? TBD   3. What type of clearance is required (medical clearance vs. Pharmacy clearance to hold med vs. Both)? Pharmacy   4. Are there any medications that need to be held prior to surgery and how long? Xarelto    5. Practice name and name of physician performing surgery? [facility]  Attn: Todd [name]   6. What is the office phone number? [phone]   7.   What is the office fax number? [phone]  8.   Anesthesia type (None, local, MAC, general) ?

## 2020-01-13 NOTE — Telephone Encounter (Signed)
Primary Cardiologist:No primary care provider on file.  Chart reviewed as part of pre-operative protocol coverage. Because of Todd [name]'s past medical history and time since last visit, he/she will require a follow-up visit in order to better assess preoperative cardiovascular risk.  Pre-op covering staff: - Please schedule appointment and call patient to inform them. - Please contact requesting surgeon's office via preferred method (i.e, phone, fax) to inform them of need for appointment prior to surgery.  If applicable, this message will also be routed to pharmacy pool and/or primary cardiologist for input on holding anticoagulant/antiplatelet agent as requested below so that this information is available at time of patient's appointment.   Deberah Pelton, NP  01/13/2020, 9:32 AM

## 2020-01-13 NOTE — Progress Notes (Signed)
Cardiology Office Note:    Date:  01/21/2020   ID:  Todd [name], DOB 1941-02-27, MRN [id number]  PCP:  Binnie Rail, MD  Cardiologist:  Minus Breeding, MD  Electrophysiologist:  None   Referring MD: Binnie Rail, MD   Chief Complaint: pre-op evaluation   History of Present Illness:    Todd [name] is a 79 y.o. 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 hypertension, hyperlipidemia, diabetes mellitus who is followed by Dr. Percival Spanish and presents today for pre-op evaluation for carpal tunnel release.  Patient has a long history of CAD s/p remote CABG x4 in 1999 with LIMA to LAD, SVG to OM1, SVG to OM2, SVG to RCA. Most recent ischemic evaluation was a Myoview in 10/2013 which showed no evidence of ischemia or infarction. Most recent carotid ultrasound in 08/2017 showed mild stenosis of bilateral ICAs. Patient was last seen by Dr. Percival Spanish in 07/2019 at which time he was doing well. He was staying active and denied any cardiac symptoms. He was advised to follow-up in 1 year.   Patient also has a history of asbestos exposures and has known pulmonary fibrosis for which he follows with Dr. Vaughan Browner. Last high resolution CT in 02/2019 showed pulmonary parenchymal pattern of fibrosis unchanged from prior exams and stable pulmonary nodules.   Patient presents today for pre-op evaluation for upcoming right endoscopic carpal tunnel release. Patient doing well from a cardiac standpoint. He denies any chest pain. Has some mild shortness of breath with activities such as push mowing the lawn but states this does not limit him. He does not have to stop to catch his breath. He attributes this to his underlying lung disease and I agree. No orthopnea or PND. Occasional very mild ankle edema. No palpitations,  lightheadedness, dizziness, or near syncope/syncope. Still tolerating Xarelto well. He occasional notes a little blood on the toilet paper (normally this is if he has been constipated) but no other abnormal bleeding or bruising. No claudication.  Past Medical History:  Diagnosis Date  . Allergic rhinitis   . Asthma   . Atrial fibrillation with rapid ventricular response ([place])    a. newly diagnosed 11/03/13, spont conv to NSR, placed on xarelto.  Marland Kitchen CAD (coronary artery disease)    a. 1999; s/p CABG: LIMA to LAD, SVG to OM1, SVG to OM2, SVG to RCA  b. Normal nuc 10/2013 (done because of new onset AF.)  . Diabetes mellitus ([place])   . HLD (hyperlipidemia)   . HTN (hypertension)   . Nephrolithiasis   . Overweight(278.02)   . PVD (peripheral vascular disease) (HCC)    Carotid stenosis  . Stroke [facility])     Past Surgical History:  Procedure Laterality Date  . CAROTID ENDARTERECTOMY  1999  . COLONOSCOPY  2014   negative;Dr Sharlett Iles  . COLONOSCOPY W/ POLYPECTOMY  2009   Dr Sharlett Iles  . CORONARY ARTERY BYPASS GRAFT  Aug.1999  . HEMORRHOID BANDING    . INTRACAPSULAR CATARACT EXTRACTION Bilateral 2018  . NASAL SINUS SURGERY      Current Medications: Current Meds  Medication Sig  . blood glucose meter kit and supplies KIT Dispense based on patient and insurance preference. Use up to four times daily as directed. (FOR E11.9).  . cetirizine (ZYRTEC) 10 MG tablet Take 10 mg  by mouth daily as needed for allergies.   . Cholecalciferol (VITAMIN D-3) 5000 UNITS TABS Take 5,000 Units by mouth daily.   Marland Kitchen glimepiride (AMARYL) 2 MG tablet TAKE 1 TABLET BY MOUTH DAILY.  Marland Kitchen glimepiride (AMARYL) 4 MG tablet Take 1 tablet (4 mg total) by mouth daily before breakfast.  . glucose 4 GM chewable tablet Chew 1 tablet by mouth as needed for low blood sugar.  Marland Kitchen glucose blood (ONE TOUCH ULTRA TEST) test strip Test blood sugar once daily  . metFORMIN (GLUCOPHAGE-XR) 500 MG 24 hr tablet TAKE 2 TABLETS BY MOUTH TWICE  A DAY  . metoprolol tartrate (LOPRESSOR) 50 MG tablet Take 1 tablet (50 mg total) by mouth 2 (two) times daily.  . nitroGLYCERIN (NITROSTAT) 0.4 MG SL tablet Place 1 tablet (0.4 mg total) under the tongue every 5 (five) minutes as needed for chest pain.  . Omega-3 Fatty Acids (FISH OIL PO) Take 1 tablet by mouth daily.  Marland Kitchen omeprazole (PRILOSEC) 20 MG capsule Take 1 capsule (20 mg total) by mouth daily.  [name] DELICA LANCETS 23J MISC TEST BLOOD SUGAR ONCE DAILY  . polycarbophil (FIBERCON) 625 MG tablet Take 625 mg by mouth 2 (two) times daily.   . rivaroxaban (XARELTO) 20 MG TABS tablet TAKE 1 TABLET (20 MG TOTAL) BY MOUTH DAILY WITH SUPPER.  . simvastatin (ZOCOR) 40 MG tablet Take 1 tablet (40 mg total) by mouth every evening.  . sitaGLIPtin (JANUVIA) 100 MG tablet Take 1 tablet (100 mg total) by mouth daily.  Marland Kitchen triamcinolone cream (KENALOG) 0.1 %      Allergies:   Amlodipine besylate and Ivp dye [iodinated diagnostic agents]   Social History   Socioeconomic History  . Marital status: Married    Spouse name: [name]  . Number of children: 2  . Years of education: Not on file  . Highest education level: Not on file  Occupational History  . Occupation: Sales Manager-retired  Tobacco Use  . Smoking status: Never Smoker  . Smokeless tobacco: Never Used  Substance and Sexual Activity  . Alcohol use: No    Alcohol/week: 0.0 standard drinks  . Drug use: No  . Sexual activity: Yes  Other Topics Concern  . Not on file  Social History Narrative  . Not on file   Social Determinants of Health   Financial Resource Strain:   . Difficulty of Paying Living Expenses:   Food Insecurity:   . Worried About Charity fundraiser in the Last Year:   . Arboriculturist in the Last Year:   Transportation Needs:   . Film/video editor (Medical):   Marland Kitchen Lack of Transportation (Non-Medical):   Physical Activity:   . Days of Exercise per Week:   . Minutes of Exercise per Session:   Stress:   .  Feeling of Stress :   Social Connections:   . Frequency of Communication with Friends and Family:   . Frequency of Social Gatherings with Friends and Family:   . Attends Religious Services:   . Active Member of Clubs or Organizations:   . Attends Archivist Meetings:   Marland Kitchen Marital Status:      Family History: The patient's family history includes Asthma in his sister; Colon cancer (age of onset: 30) in his brother; Diabetes in his father; Heart attack (age of onset: 96) in his brother; Heart attack (age of onset: 26) in his brother and mother; Hemochromatosis in his brother; Prostate cancer in his  brother; Stroke (age of onset: 82) in his father.  ROS:   Please see the history of present illness.    All other systems reviewed and are negative.  EKGs/Labs/Other Studies Reviewed:    The following studies were reviewed today:  Echocardiogram 11/10/2013: Study Conclusions: - Left ventricle: The cavity size was normal. There was mild  concentric hypertrophy. Systolic function was normal. The  estimated ejection fraction was in the range of 55% to  60%. Wall motion was normal; there were no regional wall  motion abnormalities. Doppler parameters are consistent  with abnormal left ventricular relaxation (grade 1  diastolic dysfunction). Doppler parameters are consistent  with elevated ventricular end-diastolic filling pressure.  - Aortic valve: Mildly thickened leaflets. No significant  regurgitation.  - Ascending aorta: The ascending aorta was normal in size.  - Mitral valve: No significant regurgitation.  - Right ventricle: The cavity size was mildly dilated.  Systolic function was low normal.  - Atrial septum: No defect or patent foramen ovale was  identified.  - Pulmonic valve: No significant regurgitation.  - Pericardium, extracardiac: There was no pericardial  effusion.  _______________  Myoview 11/11/2013: Impression: Exercise Capacity:   Lexiscan with low level exercise. BP Response:  Normal blood pressure response. Clinical Symptoms:  Stomach Cramps ECG Impression:  No significant ST segment change suggestive of ischemia. Comparison with Prior Nuclear Study: No images to compare  Overall Impression:  Normal stress nuclear study. LV Ejection Fraction: 65%.  LV Wall Motion:  NL LV Function; NL Wall Motion _______________  Carotid Artery Dopplers 08/23/2017: Final Interpretation: - Right Carotid: There is evidence in the right ICA of a 1-39% stenosis.  Unable to reproduce previous velocities.  - Left Carotid: There is evidence in the left ICA of a 1-39% stenosis s/p  CEA.  - Vertebrals: Both vertebral arteries were patent with antegrade flow.  - Subclavians: Normal flow hemodynamics were seen in bilateral subclavian arteries.   EKG:  EKG ordered today. EKG personally reviewed and demonstrates normal sinus rhythm, rate 76 bpm, with 1st degree AV block and non-specific ST/T changes. No acute changes compared to prior tracings.   Recent Labs: 11/05/2019: ALT 18; BUN 17; Creatinine, Ser 1.04; Hemoglobin 13.8; Platelets 167.0; Potassium 4.4; Sodium 139; TSH 4.35  Recent Lipid Panel    Component Value Date/Time   CHOL 115 11/05/2019 0829   TRIG 131.0 11/05/2019 0829   TRIG 94 07/19/2006 0727   HDL 33.00 (L) 11/05/2019 0829   CHOLHDL 3 11/05/2019 0829   VLDL 26.2 11/05/2019 0829   LDLCALC 55 11/05/2019 0829    Physical Exam:    Vital Signs: BP (!) 149/79   Pulse 81   Ht 5' 8"  (1.727 m)   Wt 197 lb 3.2 oz (89.4 kg)   SpO2 98%   BMI 29.98 kg/m     Wt Readings from Last 3 Encounters:  01/21/20 197 lb 3.2 oz (89.4 kg)  11/05/19 195 lb 9.6 oz (88.7 kg)  08/08/19 196 lb (88.9 kg)     General: 79 y.o. male in no acute distress. HEENT: Normocephalic and atraumatic. Sclera clear. EOMs intact. Neck: Supple. No carotid bruits. No JVD. Heart: RRR. Distinct S1 and S2. No murmurs, gallops, or rubs. Radial pulses 2+ and  equal bilaterally. Lungs: No increased work of breathing. Course crackles noted in right lung base (sounds consistent with pulmonary fibrosis). No wheezes or rhonchi. Abdomen: Soft, non-distended, and non-tender to palpation.  Extremities: Trace edema of bilateral lower extremities. Skin: Warm and  dry. Neuro: No focal deficits. Psych: Normal affect. Responds appropriately.   Assessment:    1. Pre-op evaluation   2. Coronary artery disease involving native coronary artery of native heart without angina pectoris   3. Paroxysmal atrial fibrillation (HCC)   4. Bilateral carotid artery stenosis   5. Hypertension, unspecified type   6. Hyperlipidemia, unspecified hyperlipidemia type   7. Type 2 diabetes mellitus with complication, without long-term current use of insulin (HCC)     Plan:    Pre-Op - Patient scheduled for right endoscopic carpal tunnel release.  - Patient doing well from a cardiac standpoint. No active issues.  - Per Revised Cardiac Risk Index, considered moderate risk given history of CAD and stroke. However, endoscopic carpal tunnel surgery is generally a lower risk procedure. Able to complete >4.0 METS. Therefore, he would be at acceptable risk for the planned procedure without further cardiovascular testing. Per pharmacy and office protocol, OK to hold Xarelto for 1 day prior to procedure. This should be resumed as soon as possible in the post-operative period. I will route this recommendation to the requesting party via Epic fax function.  CAD  - History of remote CABG x4 in 1999.  - EKG shows no acute ischemic changes. - Stable. No angina.  - Continue statin. No aspirin due to need for anticoagulation.   Paroxysmal Atrial Fibrillation - Maintaining sinus rhythm.  - Continue Lopressor 36m twice daily.  - Continue chronic anticoagulation with Xarelto 248mdaily. Patient report he will probably reach donut hole next month. Provided patient assistance forms in the  office.  Carotid Artery Stenosis - History of left CEA in 1999.  - Most recent carotid dopplers in 08/2017 showed 1-39% stenosis of bilateral ICAs.  - Dr. HoPercival Spanishid not feel like any further follow-up studies were needed.   Pulmonary Fibrosis / Asbestos Exposure - Followed by Dr. MaVaughan Browner- Most recent high resolution CT in 02/2019 showed showed pulmonary parenchymal pattern of fibrosis unchanged from prior exams and stable pulmonary nodules.    Hypertension - BP mildly elevated at 149/79 today but states it is normally well controlled at home with in the 120's to low 130's/60's.  - Continue Lopressor as above.  - Patient to continue to monitor BP at home and let usKoreanow if consistently above goal of <130/80.  Hyperlipidemia - Lipid panel from 10/2019: Total Cholesterol 115, Triglycerides 131, HDL 33, LDL 55.  - LDL at goal <70 given CAD.  - Continue Simvastatin 4029maily.  - Labs followed by PCP.   Diabetes Mellitus - On Metformin, Glimepiride, and Januvia.  - Followed by PCP.  Disposition: Follow up in 1 year with Dr. HocPercival Spanish  Medication Adjustments/Labs and Tests Ordered: Current medicines are reviewed at length with the patient today.  Concerns regarding medicines are outlined above.  Orders Placed This Encounter  Procedures  . EKG 12-Lead   No orders of the defined types were placed in this encounter.   Patient Instructions  Medication Instructions:  Your physician recommends that you continue on your current medications as directed. Please refer to the Current Medication list given to you today.  HOLD Xarelto 1 day prior to your procedure.  *If you need a refill on your cardiac medications before your next appointment, please call your pharmacy*   Follow-Up: [facility] and your [facility] needs are our priority.  As part of our continuing mission to provide you with exceptional heart care, we have created designated Provider  Care Teams.  These Care  Teams include your primary Cardiologist (physician) and Advanced Practice Providers (APPs -  Physician Assistants and Nurse Practitioners) who all work together to provide you with the care you need, when you need it.  We recommend signing up for the patient portal called "MyChart".  Sign up information is provided on this After Visit Summary.  MyChart is used to connect with patients for Virtual Visits (Telemedicine).  Patients are able to view lab/test results, encounter notes, upcoming appointments, etc.  Non-urgent messages can be sent to your provider as well.   To learn more about what you can do with MyChart, go to [email or web address].    Your next appointment:   12 month(s)  The format for your next appointment:   In Person  Provider:   You may see Minus Breeding, MD or one of the following Advanced Practice Providers on your designated Care Team:    Rosaria Ferries, PA-C  Jory Sims, DNP, ANP  Sande Rives, PA-C    Other Instructions  You have been cleared for your procedure.  Please call our office 2 months in advance to schedule your follow-up appointment with Dr. Percival Spanish.  Please call our office if your blood pressure is staying above 130/80 consistently.     Signed, Darreld Mclean, PA-C  01/21/2020 9:45 AM    [place]

## 2020-01-13 NOTE — Telephone Encounter (Signed)
Forwarded via [employer] function to requesting party

## 2020-01-13 NOTE — Telephone Encounter (Signed)
Called and s/w wife (DPR).Appt scheduled Wednesday 01-21-2020 for clearance.

## 2020-01-13 NOTE — Telephone Encounter (Signed)
Patient with diagnosis of afib on Xarelto for anticoagulation.    Procedure: Right Endoscopic Carpal Tunnel Release   Date of procedure: TBD  CHADS2-VASc score of  7 (HTN, AGE, DM2, stroke/tia x 2, CAD, AGE)  CrCl 58 ml/min  Per office protocol, patient can hold Xarelto for 1 day prior to procedure.

## 2020-01-21 ENCOUNTER — Ambulatory Visit: Payer: HMO | Admitting: Student

## 2020-01-21 ENCOUNTER — Other Ambulatory Visit: Payer: Self-pay

## 2020-01-21 ENCOUNTER — Encounter: Payer: Self-pay | Admitting: Student

## 2020-01-21 VITALS — BP 149/79 | HR 81 | Ht 68.0 in | Wt 197.2 lb

## 2020-01-21 DIAGNOSIS — E785 Hyperlipidemia, unspecified: Secondary | ICD-10-CM | POA: Diagnosis not present

## 2020-01-21 DIAGNOSIS — E118 Type 2 diabetes mellitus with unspecified complications: Secondary | ICD-10-CM

## 2020-01-21 DIAGNOSIS — I480 Paroxysmal atrial fibrillation: Secondary | ICD-10-CM

## 2020-01-21 DIAGNOSIS — I6523 Occlusion and stenosis of bilateral carotid arteries: Secondary | ICD-10-CM

## 2020-01-21 DIAGNOSIS — I10 Essential (primary) hypertension: Secondary | ICD-10-CM | POA: Diagnosis not present

## 2020-01-21 DIAGNOSIS — Z01818 Encounter for other preprocedural examination: Secondary | ICD-10-CM | POA: Diagnosis not present

## 2020-01-21 DIAGNOSIS — I2510 Atherosclerotic heart disease of native coronary artery without angina pectoris: Secondary | ICD-10-CM | POA: Diagnosis not present

## 2020-01-21 NOTE — Patient Instructions (Signed)
Medication Instructions:  Your physician recommends that you continue on your current medications as directed. Please refer to the Current Medication list given to you today.  HOLD Xarelto 1 day prior to your procedure.  *If you need a refill on your cardiac medications before your next appointment, please call your pharmacy*   Follow-Up: At [facility], you and your health needs are our priority.  As part of our continuing mission to provide you with exceptional heart care, we have created designated Provider Care Teams.  These Care Teams include your primary Cardiologist (physician) and Advanced Practice Providers (APPs -  Physician Assistants and Nurse Practitioners) who all work together to provide you with the care you need, when you need it.  We recommend signing up for the patient portal called "MyChart".  Sign up information is provided on this After Visit Summary.  MyChart is used to connect with patients for Virtual Visits (Telemedicine).  Patients are able to view lab/test results, encounter notes, upcoming appointments, etc.  Non-urgent messages can be sent to your provider as well.   To learn more about what you can do with MyChart, go to [email or web address].    Your next appointment:   12 month(s)  The format for your next appointment:   In Person  Provider:   You may see Minus Breeding, MD or one of the following Advanced Practice Providers on your designated Care Team:    Rosaria Ferries, PA-C  Jory Sims, DNP, ANP  Sande Rives, PA-C    Other Instructions  You have been cleared for your procedure.  Please call our office 2 months in advance to schedule your follow-up appointment with Dr. Percival Spanish.  Please call our office if your blood pressure is staying above 130/80 consistently.

## 2020-01-23 DIAGNOSIS — G5601 Carpal tunnel syndrome, right upper limb: Secondary | ICD-10-CM | POA: Diagnosis not present

## 2020-01-27 DIAGNOSIS — G245 Blepharospasm: Secondary | ICD-10-CM | POA: Diagnosis not present

## 2020-01-27 DIAGNOSIS — H18413 Arcus senilis, bilateral: Secondary | ICD-10-CM | POA: Diagnosis not present

## 2020-01-27 DIAGNOSIS — Z961 Presence of intraocular lens: Secondary | ICD-10-CM | POA: Diagnosis not present

## 2020-01-27 DIAGNOSIS — E119 Type 2 diabetes mellitus without complications: Secondary | ICD-10-CM | POA: Diagnosis not present

## 2020-02-03 DIAGNOSIS — Z9889 Other specified postprocedural states: Secondary | ICD-10-CM | POA: Diagnosis not present

## 2020-02-09 ENCOUNTER — Other Ambulatory Visit: Payer: Self-pay | Admitting: Cardiology

## 2020-03-08 ENCOUNTER — Other Ambulatory Visit: Payer: Self-pay | Admitting: Internal Medicine

## 2020-03-30 ENCOUNTER — Other Ambulatory Visit: Payer: Self-pay

## 2020-03-30 ENCOUNTER — Ambulatory Visit (INDEPENDENT_AMBULATORY_CARE_PROVIDER_SITE_OTHER)
Admission: RE | Admit: 2020-03-30 | Discharge: 2020-03-30 | Disposition: A | Discharge status: Home / Self Care | Payer: HMO | Source: Ambulatory Visit | Attending: Pulmonary Disease | Admitting: Pulmonary Disease

## 2020-03-30 DIAGNOSIS — I2510 Atherosclerotic heart disease of native coronary artery without angina pectoris: Secondary | ICD-10-CM | POA: Diagnosis not present

## 2020-03-30 DIAGNOSIS — J849 Interstitial pulmonary disease, unspecified: Secondary | ICD-10-CM | POA: Diagnosis not present

## 2020-03-30 DIAGNOSIS — J8410 Pulmonary fibrosis, unspecified: Secondary | ICD-10-CM | POA: Diagnosis not present

## 2020-04-16 ENCOUNTER — Telehealth: Payer: Self-pay | Admitting: Cardiology

## 2020-04-16 NOTE — Telephone Encounter (Signed)
Patient calling the office for samples of medication:   1.  What medication and dosage are you requesting samples for? XARELTO 20 MG TABS tablet  2.  Are you currently out of this medication? Patient has 7 days left

## 2020-04-16 NOTE — Telephone Encounter (Signed)
Patient in need of samples of Xarelto 20mg . Samples placed at front desk for pick up.   4 Bottles given- LOT [id number] Exp: 02-23  Patients wife notified (ok per DPR), and verbalized understanding.

## 2020-04-19 ENCOUNTER — Encounter: Payer: Self-pay | Admitting: Pulmonary Disease

## 2020-04-19 ENCOUNTER — Ambulatory Visit: Payer: HMO | Admitting: Pulmonary Disease

## 2020-04-19 ENCOUNTER — Other Ambulatory Visit: Payer: Self-pay

## 2020-04-19 VITALS — BP 126/70 | HR 77 | Temp 97.2°F | Ht 68.0 in | Wt 196.6 lb

## 2020-04-19 DIAGNOSIS — J8410 Pulmonary fibrosis, unspecified: Secondary | ICD-10-CM | POA: Diagnosis not present

## 2020-04-19 DIAGNOSIS — J849 Interstitial pulmonary disease, unspecified: Secondary | ICD-10-CM | POA: Diagnosis not present

## 2020-04-19 NOTE — Patient Instructions (Signed)
Will get spirometry and diffusion capacity in 6 months Follow-up in clinic in 6 months.

## 2020-04-19 NOTE — Progress Notes (Signed)
Richards Pherigo Litz    [id number]    Feb 06, 1941  Primary Care Physician:Burns, [name], MD  Referring Physician: Binnie Rail, MD [place],  [place] [address]  Chief complaint:   Follow up for cough Pulmonary fibrosis, Asbestosis  HPI: Mr. Bontempo is a 79 year old with history of allergies, rhinitis, atrial fibrillation, hypertension, hyperlipidemia, diabetes. He had a confirmed episode of influenza in February and was sent for 2 weeks. He has a lingering nonproductive cough associated with occasional wheezing. He denies any dyspnea, sputum production, fevers, chills. He had been given a prednisone taper last month without any improvement in symptoms. He also had a chest x-ray which showed bibasilar fibrosis and he has been referred here for further evaluation  Pets: None Occupation: Retired, used to work in [employer] Exposures: Significant exposure to asbestos in previous line of work Smoking history: None  Interim history States that breathing is stable.  He remains active with walking, yard work with no impairment   Outpatient Encounter Medications as of 04/19/2020  Medication Sig  . blood glucose meter kit and supplies KIT Dispense based on patient and insurance preference. Use up to four times daily as directed. (FOR E11.9).  . cetirizine (ZYRTEC) 10 MG tablet Take 10 mg by mouth daily as needed for allergies.   . Cholecalciferol (VITAMIN D-3) 5000 UNITS TABS Take 5,000 Units by mouth daily.   Marland Kitchen glimepiride (AMARYL) 4 MG tablet Take 1 tablet (4 mg total) by mouth daily before breakfast.  . glucose 4 GM chewable tablet Chew 1 tablet by mouth as needed for low blood sugar.  Marland Kitchen glucose blood (ONE TOUCH ULTRA TEST) test strip Test blood sugar once daily  . metFORMIN (GLUCOPHAGE-XR) 500 MG 24 hr tablet TAKE 2 TABLETS BY MOUTH TWICE A DAY  . metoprolol tartrate (LOPRESSOR) 50 MG tablet Take 1 tablet (50 mg total) by mouth 2 (two) times daily.  . nitroGLYCERIN  (NITROSTAT) 0.4 MG SL tablet Place 1 tablet (0.4 mg total) under the tongue every 5 (five) minutes as needed for chest pain.  . Omega-3 Fatty Acids (FISH OIL PO) Take 1 tablet by mouth daily.  Marland Kitchen omeprazole (PRILOSEC) 20 MG capsule Take 1 capsule (20 mg total) by mouth daily.  [name] DELICA LANCETS 50P MISC TEST BLOOD SUGAR ONCE DAILY  . polycarbophil (FIBERCON) 625 MG tablet Take 625 mg by mouth 2 (two) times daily.   . simvastatin (ZOCOR) 40 MG tablet Take 1 tablet (40 mg total) by mouth every evening.  . triamcinolone cream (KENALOG) 0.1 %   . XARELTO 20 MG TABS tablet TAKE 1 TABLET BY MOUTH DAILY WITH SUPPER.  . [DISCONTINUED] glimepiride (AMARYL) 2 MG tablet TAKE 1 TABLET BY MOUTH DAILY. (Patient not taking: Reported on 04/19/2020)  . [DISCONTINUED] sitaGLIPtin (JANUVIA) 100 MG tablet Take 1 tablet (100 mg total) by mouth daily. (Patient not taking: Reported on 04/19/2020)   No facility-administered encounter medications on file as of 04/19/2020.   Physical Exam: Gen:      No acute distress HEENT:  EOMI, sclera anicteric Neck:     No masses; no thyromegaly Lungs:    Bibasal crackles CV:         Regular rate and rhythm; no murmurs Abd:      + bowel sounds; soft, non-tender; no palpable masses, no distension Ext:    No edema; adequate peripheral perfusion Skin:      Warm and dry; no rash Neuro:  alert and oriented x 3 Psych: normal mood and affect  Data Reviewed: Imaging Chest x-ray 12/04/16-stable interstitial opacities CT abdomen 11/20/13-mild reticular opacities at the bases CT high-resolution 01/02/17- subpleural reticulation, mild traction bronchiectasis, possible early honeycombing with basal gradient. 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  Slightly worse compared to 2018 High-res CT 03/21/2019-probable UIP High-res CT 03/30/2020-unchanged probable UIP fibrosis  PFTs  03/20/17- FVC 2.74 (72%], FEV1 2.38 (87%), F/F 87, TLC 62%, DLCO 15.89 (56%)  01/15/2018-FVC 2.74  [73%], FEV1 2.33 [86%], F/F 85, TLC 71%, DLCO 13.13 (46%)  07/23/2018-FVC 2.51 [69%), FEV1 2.10 [78%), DLCO 15.16 (53%) Mild restriction, moderate-severe diffusion defect.  04/02/2019 FVC 2.70 [72%], FEV1 2.18 [82%], F/F 81, DLCO 13.8 [60%] Moderate diffusion defect  FENO 12/18/16- 12  Labs ILD serologies 01/09/17- all negative except for mild elevation in SCL 75.  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 There is a possibility that this could be idiopathic pulmonary fibrosis but most CT scans and PFTs show stability.  We had several discussions with Mr. Lefeber and his wife regarding anti-fibrotic therapy.  Given stability of pulmonary fibrosis and lack of symptoms we have decided to hold off treatment.  They are wary of possible side effects. Reevaluate with spirometry, diffusion capacity in 6 months  Health maintenance 01/15/2015-Prevnar 13 05/05/2010-Pneumovax  Plan/Recommendations: - Repeat spirometry, diffusion capacity in 6 months  Marshell Garfinkel MD [facility] 04/19/2020, 3:21 PM  CC: Binnie Rail, MD

## 2020-05-04 DIAGNOSIS — H18413 Arcus senilis, bilateral: Secondary | ICD-10-CM | POA: Diagnosis not present

## 2020-05-04 DIAGNOSIS — Z961 Presence of intraocular lens: Secondary | ICD-10-CM | POA: Diagnosis not present

## 2020-05-04 DIAGNOSIS — H16223 Keratoconjunctivitis sicca, not specified as Sjogren's, bilateral: Secondary | ICD-10-CM | POA: Diagnosis not present

## 2020-05-04 DIAGNOSIS — G245 Blepharospasm: Secondary | ICD-10-CM | POA: Diagnosis not present

## 2020-05-06 DIAGNOSIS — Z09 Encounter for follow-up examination after completed treatment for conditions other than malignant neoplasm: Secondary | ICD-10-CM | POA: Diagnosis not present

## 2020-05-06 NOTE — Patient Instructions (Addendum)
  Blood work was ordered.       Medications reviewed and updated.  Changes include :   Stop the glimepiride and start rybelsus - take before breakfast.  After one month we can increase this to 7 mg but you will need to let me know so I can send a new prescription to your pharmacy.        Please followup in 6 months

## 2020-05-06 NOTE — Progress Notes (Signed)
Subjective:    Patient ID: Todd [name], male    DOB: [birth year], 79 y.o.   MRN: [id number]  HPI The patient is here for follow up of their chronic medical problems, including CAD, AFib, htn, DM, hyperlipidemia, GERD, pulmonary fibrosis  He is taking all of his medications as prescribed.    He is exercising irregularly.   He walks.   Knot under right arm.  No pain.  He feels like he just feels it once in a while.   Sugars usually < 150.    Medications and allergies reviewed with patient and updated if appropriate.  Patient Active Problem List   Diagnosis Date Noted  . Aortic atherosclerosis ([place]) 05/07/2020  . Educated about COVID-19 virus infection 08/07/2019  . GERD (gastroesophageal reflux disease) 05/06/2019  . Pulmonary fibrosis ([place]) 05/05/2019  . Asbestosis ([place]) 05/05/2019  . Ventral hernia without obstruction or gangrene 10/31/2018  . Umbilical hernia without obstruction or gangrene 10/31/2018  . Eczema 10/29/2017  . Hives 08/13/2017  . Cough 12/04/2016  . 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 Hemorrhoids, internal, with bleeding 01/15/2015  . Bursitis of left shoulder 01/14/2015  . Type 2 diabetes, controlled, with peripheral circulatory disorder ([place]) 12/05/2013  . PVD (peripheral vascular disease) ([place]) 11/12/2013  . Atrial fibrillation ([place]) 11/03/2013  . Carotid artery disease ([place]) 12/20/2009  . OVERWEIGHT/OBESITY 11/17/2008  . Hyperlipidemia 07/20/2008  . Essential hypertension 07/20/2008  . Coronary atherosclerosis 07/20/2008  . ALLERGIC RHINITIS 07/20/2008  . NEPHROLITHIASIS, HX OF 07/20/2008    Current Outpatient Medications on File Prior to Visit  Medication Sig Dispense Refill  . fexofenadine (ALLEGRA) 180 MG tablet Take 180 mg by mouth daily.    Marland Kitchen glimepiride (AMARYL) 4 MG tablet Take 1 tablet (4 mg total) by mouth daily before breakfast. 90 tablet 1  . glucose blood (ONE TOUCH ULTRA TEST) test strip Test blood sugar once daily 100 each 1  .  metFORMIN (GLUCOPHAGE-XR) 500 MG 24 hr tablet TAKE 2 TABLETS BY MOUTH TWICE A DAY 180 tablet 1  . metoprolol tartrate (LOPRESSOR) 50 MG tablet Take 1 tablet (50 mg total) by mouth 2 (two) times daily. 180 tablet 2  . nitroGLYCERIN (NITROSTAT) 0.4 MG SL tablet Place 1 tablet (0.4 mg total) under the tongue every 5 (five) minutes as needed for chest pain. 100 tablet 3  . Omega-3 Fatty Acids (FISH OIL PO) Take 1 tablet by mouth daily.    Marland Kitchen omeprazole (PRILOSEC) 20 MG capsule Take 1 capsule (20 mg total) by mouth daily. 90 capsule 2  . ONETOUCH DELICA LANCETS 45Y MISC TEST BLOOD SUGAR ONCE DAILY 100 each 3  . polycarbophil (FIBERCON) 625 MG tablet Take 625 mg by mouth 2 (two) times daily.     . simvastatin (ZOCOR) 40 MG tablet Take 1 tablet (40 mg total) by mouth every evening. 90 tablet 2  . triamcinolone cream (KENALOG) 0.1 %   3  . XARELTO 20 MG TABS tablet TAKE 1 TABLET BY MOUTH DAILY WITH SUPPER. 90 tablet 0   No current facility-administered medications on file prior to visit.    Past Medical History:  Diagnosis Date  . Allergic rhinitis   . Asthma   . Atrial fibrillation with rapid ventricular response ([place])    a. newly diagnosed 11/03/13, spont conv to NSR, placed on xarelto.  Marland Kitchen CAD (coronary artery disease)    a. 1999; s/p CABG: LIMA to LAD, SVG to OM1, SVG to OM2, SVG to RCA  b.  Normal nuc 10/2013 (done because of new onset AF.)  . Diabetes mellitus ([place])   . HLD (hyperlipidemia)   . HTN (hypertension)   . Nephrolithiasis   . Overweight(278.02)   . PVD (peripheral vascular disease) ([facility])    Carotid stenosis  . Stroke [facility])     Past Surgical History:  Procedure Laterality Date  . CAROTID ENDARTERECTOMY  1999  . COLONOSCOPY  2014   negative;Dr Sharlett Iles  . COLONOSCOPY W/ POLYPECTOMY  2009   Dr Sharlett Iles  . CORONARY ARTERY BYPASS GRAFT  Aug.1999  . HEMORRHOID BANDING    . INTRACAPSULAR CATARACT EXTRACTION Bilateral 2018  . NASAL SINUS SURGERY      Social History    Socioeconomic History  . Marital status: Married    Spouse name: [name]  . Number of children: 2  . Years of education: Not on file  . Highest education level: Not on file  Occupational History  . Occupation: Sales Manager-retired  Tobacco Use  . Smoking status: Never Smoker  . Smokeless tobacco: Never Used  Vaping Use  . Vaping Use: Never used  Substance and Sexual Activity  . Alcohol use: No    Alcohol/week: 0.0 standard drinks  . Drug use: No  . Sexual activity: Yes  Other Topics Concern  . Not on file  Social History Narrative  . Not on file   Social Determinants of Health   Financial Resource Strain:   . Difficulty of Paying Living Expenses: Not on file  Food Insecurity:   . Worried About Charity fundraiser in the Last Year: Not on file  . Ran Out of Food in the Last Year: Not on file  Transportation Needs:   . Lack of Transportation (Medical): Not on file  . Lack of Transportation (Non-Medical): Not on file  Physical Activity:   . Days of Exercise per Week: Not on file  . Minutes of Exercise per Session: Not on file  Stress:   . Feeling of Stress : Not on file  Social Connections:   . Frequency of Communication with Friends and Family: Not on file  . Frequency of Social Gatherings with Friends and Family: Not on file  . Attends Religious Services: Not on file  . Active Member of Clubs or Organizations: Not on file  . Attends Archivist Meetings: Not on file  . Marital Status: Not on file    Family History  Problem Relation Age of Onset  . Prostate cancer Brother   . Hemochromatosis Brother   . Colon cancer Brother 33  . Heart attack Brother 45  . Asthma Sister   . Heart attack Mother 29  . Heart attack Brother 64  . Diabetes Father        IDDM  . Stroke Father 47    Review of Systems  Constitutional: Negative for chills and fever.  Respiratory: Positive for cough (chronic), shortness of breath (occ, intermittent and transient  able to  walk ok) and wheezing (occ).   Cardiovascular: Positive for palpitations (occ, chronic, intermittent) and leg swelling (occ, mild). Negative for chest pain.  Neurological: Negative for dizziness, light-headedness and headaches.  Psychiatric/Behavioral: Negative for dysphoric mood. The patient is not nervous/anxious.        Objective:   Vitals:   05/07/20 0930  BP: 138/80  Pulse: 81  Temp: 98.6 F (37 C)  SpO2: 97%   BP Readings from Last 3 Encounters:  05/07/20 138/80  04/19/20 126/70  01/21/20 (!) 149/79  Wt Readings from Last 3 Encounters:  05/07/20 174 lb 6.4 oz (79.1 kg)  04/19/20 196 lb 9.6 oz (89.2 kg)  01/21/20 197 lb 3.2 oz (89.4 kg)   Body mass index is 26.52 kg/m.   Physical Exam    Constitutional: Appears well-developed and well-nourished. No distress.  HENT:  Head: Normocephalic and atraumatic.  Neck: Neck supple. No tracheal deviation present. No thyromegaly present.  No cervical lymphadenopathy Cardiovascular: Normal rate, regular rhythm and normal heart sounds.   No murmur heard. No carotid bruit .  No edema Pulmonary/Chest: Effort normal. No respiratory distress. No has no wheezes.  Chronic dry bibasilar crackles Skin: Skin is warm and dry. Not diaphoretic.  Psychiatric: Normal mood and affect. Behavior is normal.      Assessment & Plan:    See Problem List for Assessment and Plan of chronic medical problems.    This visit occurred during the SARS-CoV-2 public health emergency.  Safety protocols were in place, including screening questions prior to the visit, additional usage of staff PPE, and extensive cleaning of exam room while observing appropriate contact time as indicated for disinfecting solutions.

## 2020-05-07 ENCOUNTER — Encounter: Payer: Self-pay | Admitting: Internal Medicine

## 2020-05-07 ENCOUNTER — Other Ambulatory Visit: Payer: Self-pay

## 2020-05-07 ENCOUNTER — Ambulatory Visit (INDEPENDENT_AMBULATORY_CARE_PROVIDER_SITE_OTHER): Payer: HMO | Admitting: Internal Medicine

## 2020-05-07 VITALS — BP 138/80 | HR 81 | Temp 98.6°F | Wt 174.4 lb

## 2020-05-07 DIAGNOSIS — J8410 Pulmonary fibrosis, unspecified: Secondary | ICD-10-CM | POA: Diagnosis not present

## 2020-05-07 DIAGNOSIS — I10 Essential (primary) hypertension: Secondary | ICD-10-CM | POA: Diagnosis not present

## 2020-05-07 DIAGNOSIS — I700 Atherosclerosis of aorta: Secondary | ICD-10-CM

## 2020-05-07 DIAGNOSIS — I4891 Unspecified atrial fibrillation: Secondary | ICD-10-CM

## 2020-05-07 DIAGNOSIS — E1151 Type 2 diabetes mellitus with diabetic peripheral angiopathy without gangrene: Secondary | ICD-10-CM | POA: Diagnosis not present

## 2020-05-07 DIAGNOSIS — E7849 Other hyperlipidemia: Secondary | ICD-10-CM

## 2020-05-07 DIAGNOSIS — K219 Gastro-esophageal reflux disease without esophagitis: Secondary | ICD-10-CM | POA: Diagnosis not present

## 2020-05-07 DIAGNOSIS — I2510 Atherosclerotic heart disease of native coronary artery without angina pectoris: Secondary | ICD-10-CM | POA: Diagnosis not present

## 2020-05-07 MED ORDER — RYBELSUS 3 MG PO TABS: 3.0000 mg | ORAL_TABLET | Freq: Every day | ORAL | 5 refills | Status: DC

## 2020-05-07 NOTE — Assessment & Plan Note (Signed)
Chronic Check lipid panel  Continue daily statin Regular exercise and healthy diet encouraged  

## 2020-05-07 NOTE — Assessment & Plan Note (Signed)
Chronic Last A1c was not controlled Sugars at home have been better controlled He is walking, and but not on a regular basis-stressed regular exercise Check A1c Will discontinue glimepiride and start Rybelsus 3 mg daily-we will try to increase this to 70 mg in a month if he does well with that Continue Metformin

## 2020-05-07 NOTE — Assessment & Plan Note (Signed)
Chronic Has occasional shortness of breath, but nothing concerning Following with pulmonary

## 2020-05-07 NOTE — Assessment & Plan Note (Signed)
Chronic He denies reflux He burps a lot Sounds controlled - monitor burping Continue omeprazole daily

## 2020-05-07 NOTE — Assessment & Plan Note (Signed)
Chronic On simvastatin 40 mg daily - continue Check lipids, cmp Stressed healthy diet, regular exercise

## 2020-05-07 NOTE — Assessment & Plan Note (Signed)
Chronic Asymptomatic On Xarelto, metoprolol CBC, CMP

## 2020-05-07 NOTE — Assessment & Plan Note (Signed)
Chronic Following with cardiology No concerning symptoms Continue current medications

## 2020-05-07 NOTE — Assessment & Plan Note (Signed)
Chronic BP well controlled Current regimen effective and well tolerated Continue current medications at current doses cmp  

## 2020-05-08 LAB — COMPLETE METABOLIC PANEL WITH GFR
AG Ratio: 1.2 (calc) (ref 1.0–2.5)
ALT: 24 U/L (ref 9–46)
AST: 30 U/L (ref 10–35)
Albumin: 4.1 g/dL (ref 3.6–5.1)
Alkaline phosphatase (APISO): 67 U/L (ref 35–144)
BUN: 18 mg/dL (ref 7–25)
CO2: 24 mmol/L (ref 20–32)
Calcium: 9.3 mg/dL (ref 8.6–10.3)
Chloride: 109 mmol/L (ref 98–110)
Creat: 0.93 mg/dL (ref 0.70–1.18)
GFR, Est African American: 91 mL/min/{1.73_m2} (ref >=60)
GFR, Est Non African American: 79 mL/min/{1.73_m2} (ref >=60)
Globulin: 3.3 g/dL (calc) (ref 1.9–3.7)
Glucose, Bld: 120 mg/dL — ABNORMAL HIGH (ref 65–99)
Potassium: 4.6 mmol/L (ref 3.5–5.3)
Sodium: 142 mmol/L (ref 135–146)
Total Bilirubin: 0.5 mg/dL (ref 0.2–1.2)
Total Protein: 7.4 g/dL (ref 6.1–8.1)

## 2020-05-08 LAB — HEMOGLOBIN A1C
Hgb A1c MFr Bld: 8.7 % of total Hgb — ABNORMAL HIGH (ref <5.7)
Mean Plasma Glucose: 203 (calc)
eAG (mmol/L): 11.2 (calc)

## 2020-05-08 LAB — CBC WITH DIFFERENTIAL/PLATELET
Absolute Monocytes: 515 cells/uL (ref 200–950)
Basophils Absolute: 37 cells/uL (ref 0–200)
Basophils Relative: 0.6 %
Eosinophils Absolute: 87 cells/uL (ref 15–500)
Eosinophils Relative: 1.4 %
HCT: 38.9 % (ref 38.5–50.0)
Hemoglobin: 13.2 g/dL (ref 13.2–17.1)
Lymphs Abs: 1928 cells/uL (ref 850–3900)
MCH: 33.6 pg — ABNORMAL HIGH (ref 27.0–33.0)
MCHC: 33.9 g/dL (ref 32.0–36.0)
MCV: 99 fL (ref 80.0–100.0)
MPV: 10.9 fL (ref 7.5–12.5)
Monocytes Relative: 8.3 %
Neutro Abs: 3633 cells/uL (ref 1500–7800)
Neutrophils Relative %: 58.6 %
Platelets: 172 10*3/uL (ref 140–400)
RBC: 3.93 10*6/uL — ABNORMAL LOW (ref 4.20–5.80)
RDW: 14.7 % (ref 11.0–15.0)
Total Lymphocyte: 31.1 %
WBC: 6.2 10*3/uL (ref 3.8–10.8)

## 2020-05-08 LAB — MICROALBUMIN / CREATININE URINE RATIO
Creatinine, Urine: 160 mg/dL (ref 20–320)
Microalb Creat Ratio: 12 mcg/mg creat (ref <30)
Microalb, Ur: 1.9 mg/dL

## 2020-05-19 ENCOUNTER — Telehealth: Payer: Self-pay | Admitting: Internal Medicine

## 2020-05-19 ENCOUNTER — Ambulatory Visit: Payer: HMO

## 2020-05-19 DIAGNOSIS — Z20828 Contact with and (suspected) exposure to other viral communicable diseases: Secondary | ICD-10-CM | POA: Diagnosis not present

## 2020-05-19 NOTE — Telephone Encounter (Signed)
LVM FOR PT TO RTN MY CALL TO R/S APPT

## 2020-05-20 ENCOUNTER — Ambulatory Visit: Payer: Self-pay

## 2020-05-21 ENCOUNTER — Telehealth: Payer: Self-pay | Admitting: Internal Medicine

## 2020-05-21 NOTE — Telephone Encounter (Signed)
When did his symptoms start and what symptoms is he having?

## 2020-05-21 NOTE — Telephone Encounter (Signed)
Notified pt/wife w/MD response../lmb 

## 2020-05-21 NOTE — Telephone Encounter (Signed)
Symptoms started on Monday 9.27.21 Wednesday morning covid test, positive on 9.29.21  Worried about fibrosis of the lungs  Coughing today and Wednesday, fever 101.7, no appetite, Really sore due to excessive coughing  Please call [name]--

## 2020-05-21 NOTE — Telephone Encounter (Signed)
Patient's wife called and said that they found out last night that he was positive for Covid-19 and she was wondering if there was anything that they needed to do and she was also wondring if he could get the infusion.

## 2020-05-21 NOTE — Telephone Encounter (Signed)
Referred - someone will call him

## 2020-05-22 ENCOUNTER — Other Ambulatory Visit: Payer: Self-pay | Admitting: Primary Care

## 2020-05-22 DIAGNOSIS — U071 COVID-19: Secondary | ICD-10-CM

## 2020-05-22 NOTE — Progress Notes (Signed)
I connected by phone with Todd [name] on 05/22/2020 at 12:19 PM to discuss the potential use of a new treatment for mild to moderate COVID-19 viral infection in non-hospitalized patients.  This patient is a 79 y.o. male that meets the FDA criteria for Emergency Use Authorization of COVID monoclonal antibody casirivimab/imdevimab or bamlanivimab/eteseviamb.  Has a (+) direct SARS-CoV-2 viral test result  Has mild or moderate COVID-19   Is NOT hospitalized due to COVID-19  Is within 10 days of symptom onset  Has at least one of the high risk factor(s) for progression to severe COVID-19 and/or hospitalization as defined in EUA.  Specific high risk criteria : Older age (>/= 77 yo), Diabetes, Cardiovascular disease or hypertension and Chronic Lung Disease   I have spoken and communicated the following to the patient or parent/caregiver regarding COVID monoclonal antibody treatment:  1. FDA has authorized the emergency use for the treatment of mild to moderate COVID-19 in adults and pediatric patients with positive results of direct SARS-CoV-2 viral testing who are 77 years of age and older weighing at least 40 kg, and who are at high risk for progressing to severe COVID-19 and/or hospitalization.  2. The significant known and potential risks and benefits of COVID monoclonal antibody, and the extent to which such potential risks and benefits are unknown.  3. Information on available alternative treatments and the risks and benefits of those alternatives, including clinical trials.  4. Patients treated with COVID monoclonal antibody should continue to self-isolate and use infection control measures (e.g., wear mask, isolate, social distance, avoid sharing personal items, clean and disinfect "high touch" surfaces, and frequent handwashing) according to CDC guidelines.   5. The patient or parent/caregiver has the option to accept or refuse COVID monoclonal antibody treatment.  After reviewing  this information with the patient, the patient has agreed to receive one of the available covid 19 monoclonal antibodies and will be provided an appropriate fact sheet prior to infusion. Pleas Koch, NP 05/22/2020 12:19 PM

## 2020-05-23 ENCOUNTER — Other Ambulatory Visit (HOSPITAL_COMMUNITY): Payer: Self-pay

## 2020-05-23 ENCOUNTER — Ambulatory Visit (HOSPITAL_COMMUNITY)
Admission: RE | Admit: 2020-05-23 | Discharge: 2020-05-23 | Disposition: A | Discharge status: Home / Self Care | Payer: Medicare Other | Source: Ambulatory Visit | Attending: Pulmonary Disease | Admitting: Pulmonary Disease

## 2020-05-23 DIAGNOSIS — U071 COVID-19: Secondary | ICD-10-CM

## 2020-05-23 DIAGNOSIS — Z23 Encounter for immunization: Secondary | ICD-10-CM | POA: Insufficient documentation

## 2020-05-23 MED ORDER — EPINEPHRINE 0.3 MG/0.3ML IJ SOAJ: 0.3000 mg | Freq: Once | INTRAMUSCULAR | Status: DC | PRN

## 2020-05-23 MED ORDER — FAMOTIDINE IN NACL 20-0.9 MG/50ML-% IV SOLN: 20.0000 mg | Freq: Once | INTRAVENOUS | Status: DC | PRN

## 2020-05-23 MED ORDER — ALBUTEROL SULFATE HFA 108 (90 BASE) MCG/ACT IN AERS: 2.0000 | INHALATION_SPRAY | Freq: Once | RESPIRATORY_TRACT | Status: DC | PRN

## 2020-05-23 MED ORDER — DIPHENHYDRAMINE HCL 50 MG/ML IJ SOLN: 50.0000 mg | Freq: Once | INTRAMUSCULAR | Status: DC | PRN

## 2020-05-23 MED ORDER — METHYLPREDNISOLONE SODIUM SUCC 125 MG IJ SOLR: 125.0000 mg | Freq: Once | INTRAMUSCULAR | Status: DC | PRN

## 2020-05-23 MED ORDER — SODIUM CHLORIDE 0.9 % IV SOLN
1200.0000 mg | Freq: Once | INTRAVENOUS | Status: AC
  Administered 2020-05-23: 1200 mg via INTRAVENOUS

## 2020-05-23 MED ORDER — SODIUM CHLORIDE 0.9 % IV SOLN: INTRAVENOUS | Status: DC | PRN

## 2020-05-23 NOTE — Discharge Instructions (Signed)

## 2020-05-23 NOTE — Progress Notes (Signed)
  Diagnosis: COVID-19  Physician: Dr. Joya Gaskins  Procedure: Covid Infusion Clinic Med: casirivimab\imdevimab infusion - Provided patient with casirivimab\imdevimab fact sheet for patients, parents and caregivers prior to infusion.  Complications: No immediate complications noted.  Discharge: Discharged home   Todd [name] 05/23/2020

## 2020-05-27 ENCOUNTER — Other Ambulatory Visit: Payer: Self-pay | Admitting: Cardiology

## 2020-05-28 ENCOUNTER — Ambulatory Visit: Payer: Self-pay

## 2020-06-04 ENCOUNTER — Ambulatory Visit: Payer: Medicare Other

## 2020-06-07 ENCOUNTER — Other Ambulatory Visit: Payer: Self-pay | Admitting: Internal Medicine

## 2020-06-07 ENCOUNTER — Other Ambulatory Visit: Payer: Self-pay

## 2020-06-07 ENCOUNTER — Telehealth: Payer: Self-pay | Admitting: Internal Medicine

## 2020-06-07 MED ORDER — RYBELSUS 7 MG PO TABS: 7.0000 mg | ORAL_TABLET | Freq: Every day | ORAL | 1 refills | Status: DC

## 2020-06-07 NOTE — Patient Outreach (Signed)
Received a Pharmacy referral for a [facility] [employer] patient. The Primary Care Physician is using [facility] services.   I have sent an email referral to: Charlene Brooke  with [facility].

## 2020-06-07 NOTE — Patient Outreach (Signed)
[facility]) Care Management Chronic Special Needs Program  06/07/2020  Name: Todd [name] DOB: [birth year]  MRN: [id number]  [name] is enrolled in a chronic special needs plan for Diabetes. RNCM called to follow up, review and update care plan.  RNCM spoke with wife per client's request. Client hard of hearing and Todd [name] manages client with all telephonic calls.  Subjective: reports recently getting over Covid 19, but states client is better, still has a cough. Blood sugar 171 this morning, but she reports client "ate a lot of things he should not have"  on yesterday. Last A1C 8.7 on 04/27/20. Client on Rybelsus daily and continues to take Metformin 1019m twice a day.   Mrs. SLuckenreports client is on Xarelto and although they have been able to get the medicine, the cost is expensive and is agreeable to pharmacy referral to see if client is eligible for any assistance.   Goals Addressed              This Visit's Progress   .  COMPLETED: <enter goal here> (pt-stated)        Goal weight is 190:  Per wife, goal is met. Client weight 186 pounds. Continue to be as active as possible, continue to eat a nutritious well balanced diet.    .  COMPLETED: Client verbalizes knowledge of Heart Attack self management skills within the next 6-9 months.        Takes medications as prescribed. Attends provider visits as scheduled.    .  COMPLETED: Client will not report change from baseline and no repeated symptoms of stroke with in the next 6-9 months         Reports not signs or symptoms.     .  COMPLETED: Client will report no worsening of symptoms of Atrial Fibrillation within the next 6-9 months        No worsening of symptoms of atrial fibrillation. Upcoming cardiology visit January 2022.     .Marland Kitchen COMPLETED: Client will report no worsening of symptoms related to heart disease within the next 6-9 months.         Reports no worsening of symptoms related to heart  disease. Upcoming cardiology visit January 2022.     .Marland Kitchen COMPLETED: Client will verbalize knowledge of chronic lung disease as evidenced by no ED visits or Inpatient stays related to chronic lung disease         No ED visits or hospitalizations due to chronic lung disease.    .  COMPLETED: HEMOGLOBIN A1C < 7        A1C 8.7 on 04/27/20.   Continue diabetes self-management actions:  Glucose monitoring per provider recommendations  Eat Healthy: eat low carbohydrate, low salt meals; monitor the salt intake and avoid sugar sweetened drinks.  Visit provider every 3-6 months as directed  Hbg A1C level every 3-6 months.  Take medications as prescribed.  Attend provider visits as scheduled.     .  COMPLETED: Obtain Annual Foot Exam        Done 05/07/20 and 10/31/19.     .Marland Kitchen COMPLETED: Obtain annual screen for micro albuminuria (urine) , nephropathy (kidney problems)        Done 05/08/2019    .  COMPLETED: Obtain Hemoglobin A1C at least 2 times per year        8.7 on 04/27/20 and 8.3 on 11/05/19.     .Marland Kitchen COMPLETED: Patient Stated  I would like to get back to going to the gym. I will just go to the gym 3 times a week. Per wife -this is no longer a goal that client would like to achieve. Request to complete this goal.    .  Patient Stated: will report communication with pharmacist regarding ability to obtain medication        Medications reviewed. Discussed medication management RNCM made referral to pharmacist to see if there is any assistance available for Xarelto. Call provider and/or your RN care coordinator, if you are at any time unable to obtain any medications.    .  COMPLETED: Visit Primary Care Provider or Endocrinologist at least 2 times per year         Reports sees provider as least twice a year: 05/07/20 and 11/05/19.       Plan: referral sent to pharmacy to see if there is any assistance for Xarelto, send updated care plan to client; send updated care plan to primary care  provider; next outreach per tier level within the next 9-12 months.    Thea Silversmith, RN, MSN, [place] [phone]

## 2020-06-07 NOTE — Telephone Encounter (Signed)
Left message today on answering machine that medication had been sent in.

## 2020-06-07 NOTE — Telephone Encounter (Signed)
°  Spouse calling to report recent blood sugars. Patient has  reading between 82--172 Should he increase Semaglutide (RYBELSUS) 3 MG TABS? Patient has 2 pills remaining  Pharmacy:[facility] - [place], [place]

## 2020-06-07 NOTE — Telephone Encounter (Signed)
Yes - new dose send to pharmacy. - 7 mg daily

## 2020-06-08 ENCOUNTER — Ambulatory Visit (INDEPENDENT_AMBULATORY_CARE_PROVIDER_SITE_OTHER): Payer: HMO

## 2020-06-08 DIAGNOSIS — Z0000 Encounter for general adult medical examination without abnormal findings: Secondary | ICD-10-CM | POA: Diagnosis not present

## 2020-06-08 NOTE — Patient Instructions (Addendum)
Todd [name] , Thank you for taking time to come for your Medicare Wellness Visit. I appreciate your ongoing commitment to your health goals. Please review the following plan we discussed and let me know if I can assist you in the future.   Screening recommendations/referrals: Colonoscopy: no repeat due to age Recommended yearly ophthalmology/optometry visit for glaucoma screening and checkup Recommended yearly dental visit for hygiene and checkup  Vaccinations: Influenza vaccine: 05/30/2019 Pneumococcal vaccine: up to date Tdap vaccine: up to date Shingles vaccine: up to date   Covid-19: up to date  Advanced directives: Please bring a copy of your health care power of attorney and living will to the office at your convenience.  Conditions/risks identified: Yes; Reviewed health maintenance screenings with patient today and relevant education, vaccines, and/or referrals were provided. Please continue to do your personal lifestyle choices by: daily care of teeth and gums, regular physical activity (goal should be 5 days a week for 30 minutes), eat a healthy diet, avoid tobacco and drug use, limiting any alcohol intake, taking a low-dose aspirin (if not allergic or have been advised by your provider otherwise) and taking vitamins and minerals as recommended by your provider. Continue doing brain stimulating activities (puzzles, reading, adult coloring books, staying active) to keep memory sharp. Continue to eat heart healthy diet (full of fruits, vegetables, whole grains, lean protein, water--limit salt, fat, and sugar intake) and increase physical activity as tolerated.  Next appointment: Please schedule your next Medicare Wellness Visit with your Nurse Health Advisor in 1 year by calling [phone]. Preventive Care 9 Years and Older, Male Preventive care refers to lifestyle choices and visits with your health care provider that can promote health and wellness. What does preventive care  include?  A yearly physical exam. This is also called an annual well check.  Dental exams once or twice a year.  Routine eye exams. Ask your health care provider how often you should have your eyes checked.  Personal lifestyle choices, including:  Daily care of your teeth and gums.  Regular physical activity.  Eating a healthy diet.  Avoiding tobacco and drug use.  Limiting alcohol use.  Practicing safe sex.  Taking low doses of aspirin every day.  Taking vitamin and mineral supplements as recommended by your health care provider. What happens during an annual well check? The services and screenings done by your health care provider during your annual well check will depend on your age, overall health, lifestyle risk factors, and family history of disease. Counseling  Your health care provider may ask you questions about your:  Alcohol use.  Tobacco use.  Drug use.  Emotional well-being.  Home and relationship well-being.  Sexual activity.  Eating habits.  History of falls.  Memory and ability to understand (cognition).  Work and work Statistician. Screening  You may have the following tests or measurements:  Height, weight, and BMI.  Blood pressure.  Lipid and cholesterol levels. These may be checked every 5 years, or more frequently if you are over 42 years old.  Skin check.  Lung cancer screening. You may have this screening every year starting at age 45 if you have a 30-pack-year history of smoking and currently smoke or have quit within the past 15 years.  Fecal occult blood test (FOBT) of the stool. You may have this test every year starting at age 40.  Flexible sigmoidoscopy or colonoscopy. You may have a sigmoidoscopy every 5 years or a colonoscopy every 10 years starting at  age 34.  Prostate cancer screening. Recommendations will vary depending on your family history and other risks.  Hepatitis C blood test.  Hepatitis B blood  test.  Sexually transmitted disease (STD) testing.  Diabetes screening. This is done by checking your blood sugar (glucose) after you have not eaten for a while (fasting). You may have this done every 1-3 years.  Abdominal aortic aneurysm (AAA) screening. You may need this if you are a current or former smoker.  Osteoporosis. You may be screened starting at age 76 if you are at high risk. Talk with your health care provider about your test results, treatment options, and if necessary, the need for more tests. Vaccines  Your health care provider may recommend certain vaccines, such as:  Influenza vaccine. This is recommended every year.  Tetanus, diphtheria, and acellular pertussis (Tdap, Td) vaccine. You may need a Td booster every 10 years.  Zoster vaccine. You may need this after age 63.  Pneumococcal 13-valent conjugate (PCV13) vaccine. One dose is recommended after age 67.  Pneumococcal polysaccharide (PPSV23) vaccine. One dose is recommended after age 9. Talk to your health care provider about which screenings and vaccines you need and how often you need them. This information is not intended to replace advice given to you by your health care provider. Make sure you discuss any questions you have with your health care provider. Document Released: 09/03/2015 Document Revised: 04/26/2016 Document Reviewed: 06/08/2015 Elsevier Interactive Patient Education  2017 [place] Prevention in the Home Falls can cause injuries. They can happen to people of all ages. There are many things you can do to make your home safe and to help prevent falls. What can I do on the outside of my home?  Regularly fix the edges of walkways and driveways and fix any cracks.  Remove anything that might make you trip as you walk through a door, such as a raised step or threshold.  Trim any bushes or trees on the path to your home.  Use bright outdoor lighting.  Clear any walking paths of  anything that might make someone trip, such as rocks or tools.  Regularly check to see if handrails are loose or broken. Make sure that both sides of any steps have handrails.  Any raised decks and porches should have guardrails on the edges.  Have any leaves, snow, or ice cleared regularly.  Use sand or salt on walking paths during winter.  Clean up any spills in your garage right away. This includes oil or grease spills. What can I do in the bathroom?  Use night lights.  Install grab bars by the toilet and in the tub and shower. Do not use towel bars as grab bars.  Use non-skid mats or decals in the tub or shower.  If you need to sit down in the shower, use a plastic, non-slip stool.  Keep the floor dry. Clean up any water that spills on the floor as soon as it happens.  Remove soap buildup in the tub or shower regularly.  Attach bath mats securely with double-sided non-slip rug tape.  Do not have throw rugs and other things on the floor that can make you trip. What can I do in the bedroom?  Use night lights.  Make sure that you have a light by your bed that is easy to reach.  Do not use any sheets or blankets that are too big for your bed. They should not hang down onto the  floor.  Have a firm chair that has side arms. You can use this for support while you get dressed.  Do not have throw rugs and other things on the floor that can make you trip. What can I do in the kitchen?  Clean up any spills right away.  Avoid walking on wet floors.  Keep items that you use a lot in easy-to-reach places.  If you need to reach something above you, use a strong step stool that has a grab bar.  Keep electrical cords out of the way.  Do not use floor polish or wax that makes floors slippery. If you must use wax, use non-skid floor wax.  Do not have throw rugs and other things on the floor that can make you trip. What can I do with my stairs?  Do not leave any items on the  stairs.  Make sure that there are handrails on both sides of the stairs and use them. Fix handrails that are broken or loose. Make sure that handrails are as long as the stairways.  Check any carpeting to make sure that it is firmly attached to the stairs. Fix any carpet that is loose or worn.  Avoid having throw rugs at the top or bottom of the stairs. If you do have throw rugs, attach them to the floor with carpet tape.  Make sure that you have a light switch at the top of the stairs and the bottom of the stairs. If you do not have them, ask someone to add them for you. What else can I do to help prevent falls?  Wear shoes that:  Do not have high heels.  Have rubber bottoms.  Are comfortable and fit you well.  Are closed at the toe. Do not wear sandals.  If you use a stepladder:  Make sure that it is fully opened. Do not climb a closed stepladder.  Make sure that both sides of the stepladder are locked into place.  Ask someone to hold it for you, if possible.  Clearly mark and make sure that you can see:  Any grab bars or handrails.  First and last steps.  Where the edge of each step is.  Use tools that help you move around (mobility aids) if they are needed. These include:  Canes.  Walkers.  Scooters.  Crutches.  Turn on the lights when you go into a dark area. Replace any light bulbs as soon as they burn out.  Set up your furniture so you have a clear path. Avoid moving your furniture around.  If any of your floors are uneven, fix them.  If there are any pets around you, be aware of where they are.  Review your medicines with your doctor. Some medicines can make you feel dizzy. This can increase your chance of falling. Ask your doctor what other things that you can do to help prevent falls. This information is not intended to replace advice given to you by your health care provider. Make sure you discuss any questions you have with your health care  provider. Document Released: 06/03/2009 Document Revised: 01/13/2016 Document Reviewed: 09/11/2014 Elsevier Interactive Patient Education  2017 [employer].

## 2020-06-08 NOTE — Progress Notes (Signed)
I connected with Todd [name] today by telephone and verified that I am speaking with the correct person using two identifiers. Location patient: home Location provider: work Persons participating in the virtual visit: Mase Dhondt, wife and Lisette Abu, LPN  I discussed the limitations, risks, security and privacy concerns of performing an evaluation and management service by telephone and the availability of in person appointments. I also discussed with the patient that there may be a patient responsible charge related to this service. The patient expressed understanding and verbally consented to this telephonic visit.    Interactive audio and video telecommunications were attempted between this provider and patient, however failed, due to patient having technical difficulties OR patient did not have access to video capability.  We continued and completed visit with audio only.  Some vital signs may be absent or patient reported.   Time Spent with patient on telephone encounter: 20 minutes  Subjective:   Todd [name] is a 79 y.o. male who presents for Medicare Annual/Subsequent preventive examination.  Review of Systems    NO ROS. Medicare Wellness Visit. Cardiac Risk Factors include: advanced age (>28men, >16 women);family history of premature cardiovascular disease;dyslipidemia;hypertension;male gender     Objective:    There were no vitals filed for this visit. There is no height or weight on file to calculate BMI.  Advanced Directives 06/08/2020 06/07/2020 12/23/2018 10/31/2018 10/21/2018 10/29/2017 01/15/2015  Does Patient Have a Medical Advance Directive? Yes Yes Yes Yes Yes Yes Yes  Type of Paramedic of [place];Living will [place];Living will [place];Living will [place];Living will Living will [place];Living will -  Does patient want to make changes to medical advance  directive? No - Patient declined No - Patient declined No - Patient declined - - - -  Copy of [place] in Chart? No - copy requested - - No - copy requested - No - copy requested Yes  Pre-existing out of facility DNR order (yellow form or pink MOST form) - - - - - - -    Current Medications (verified) Outpatient Encounter Medications as of 06/08/2020  Medication Sig  . fexofenadine (ALLEGRA) 180 MG tablet Take 180 mg by mouth daily.  Marland Kitchen glucose blood (ONE TOUCH ULTRA TEST) test strip Test blood sugar once daily  . metFORMIN (GLUCOPHAGE-XR) 500 MG 24 hr tablet TAKE 2 TABLETS BY MOUTH TWICE A DAY  . metoprolol tartrate (LOPRESSOR) 50 MG tablet Take 1 tablet (50 mg total) by mouth 2 (two) times daily.  . nitroGLYCERIN (NITROSTAT) 0.4 MG SL tablet Place 1 tablet (0.4 mg total) under the tongue every 5 (five) minutes as needed for chest pain.  . Omega-3 Fatty Acids (FISH OIL PO) Take 1 tablet by mouth daily.  Marland Kitchen omeprazole (PRILOSEC) 20 MG capsule Take 1 capsule (20 mg total) by mouth daily.  [name] DELICA LANCETS 19E MISC TEST BLOOD SUGAR ONCE DAILY  . Semaglutide (RYBELSUS) 7 MG TABS Take 7 mg by mouth daily before breakfast.  . simvastatin (ZOCOR) 40 MG tablet Take 1 tablet (40 mg total) by mouth every evening.  . triamcinolone cream (KENALOG) 0.1 %   . polycarbophil (FIBERCON) 625 MG tablet Take 625 mg by mouth 2 (two) times daily.  (Patient not taking: Reported on 06/08/2020)  . XARELTO 20 MG TABS tablet TAKE 1 TABLET BY MOUTH DAILY WITH SUPPER.   No facility-administered encounter medications on file as of 06/08/2020.  Allergies (verified) Amlodipine besylate and Ivp dye [iodinated diagnostic agents]   History: Past Medical History:  Diagnosis Date  . Allergic rhinitis   . Asthma   . Atrial fibrillation with rapid ventricular response ([place])    a. newly diagnosed 11/03/13, spont conv to NSR, placed on xarelto.  Marland Kitchen CAD (coronary artery disease)    a. 1999; s/p  CABG: LIMA to LAD, SVG to OM1, SVG to OM2, SVG to RCA  b. Normal nuc 10/2013 (done because of new onset AF.)  . Diabetes mellitus ([place])   . HLD (hyperlipidemia)   . HTN (hypertension)   . Nephrolithiasis   . Overweight(278.02)   . PVD (peripheral vascular disease) (H[facility])    Carotid stenosis  . Stroke [facility])    Past Surgical History:  Procedure Laterality Date  . CAROTID ENDARTERECTOMY  1999  . COLONOSCOPY  2014   negative;Dr Sharlett Iles  . COLONOSCOPY W/ POLYPECTOMY  2009   Dr Sharlett Iles  . CORONARY ARTERY BYPASS GRAFT  Aug.1999  . HEMORRHOID BANDING    . INTRACAPSULAR CATARACT EXTRACTION Bilateral 2018  . NASAL SINUS SURGERY     Family History  Problem Relation Age of Onset  . Prostate cancer Brother   . Hemochromatosis Brother   . Colon cancer Brother 59  . Heart attack Brother 13  . Asthma Sister   . Heart attack Mother 67  . Heart attack Brother 5  . Diabetes Father        IDDM  . Stroke Father 68   Social History   Socioeconomic History  . Marital status: Married    Spouse name: Todd [name]  . Number of children: 2  . Years of education: Not on file  . Highest education level: Not on file  Occupational History  . Occupation: Sales Manager-retired  Tobacco Use  . Smoking status: Never Smoker  . Smokeless tobacco: Never Used  Vaping Use  . Vaping Use: Never used  Substance and Sexual Activity  . Alcohol use: No    Alcohol/week: 0.0 standard drinks  . Drug use: No  . Sexual activity: Yes  Other Topics Concern  . Not on file  Social History Narrative  . Not on file   Social Determinants of Health   Financial Resource Strain: Low Risk   . Difficulty of Paying Living Expenses: Not hard at all  Food Insecurity: No Food Insecurity  . Worried About Charity fundraiser in the Last Year: Never true  . Ran Out of Food in the Last Year: Never true  Transportation Needs: No Transportation Needs  . Lack of Transportation (Medical): No  . Lack of Transportation  (Non-Medical): No  Physical Activity: Sufficiently Active  . Days of Exercise per Week: 5 days  . Minutes of Exercise per Session: 30 min  Stress: No Stress Concern Present  . Feeling of Stress : Not at all  Social Connections: Socially Integrated  . Frequency of Communication with Friends and Family: More than three times a week  . Frequency of Social Gatherings with Friends and Family: Once a week  . Attends Religious Services: More than 4 times per year  . Active Member of Clubs or Organizations: No  . Attends Archivist Meetings: More than 4 times per year  . Marital Status: Married    Tobacco Counseling Counseling given: Not Answered   Clinical Intake:  Pre-visit preparation completed: Yes  Pain : No/denies pain     Nutritional Risks: None Diabetes: No  How often do  you need to have someone help you when you read instructions, pamphlets, or other written materials from your doctor or pharmacy?: 1 - Never What is the last grade level you completed in school?: HSG  Diabetic? no  Interpreter Needed?: No  Information entered by :: Keyanah Kozicki N. Audrie Kuri, LPN   Activities of Daily Living In your present state of health, do you have any difficulty performing the following activities: 06/08/2020 06/07/2020  Hearing? N Y  Vision? N N  Difficulty concentrating or making decisions? N N  Walking or climbing stairs? N N  Dressing or bathing? N N  Doing errands, shopping? N N  Preparing Food and eating ? N N  Using the Toilet? N N  In the past six months, have you accidently leaked urine? N -  Do you have problems with loss of bowel control? N -  Managing your Medications? N N  Comment - wife fills Advertising account executive your Finances? N N  Housekeeping or managing your Housekeeping? N -  Some recent data might be hidden    Patient Care Team: Binnie Rail, MD as PCP - General (Internal Medicine) Minus Breeding, MD as PCP - Cardiology  (Cardiology) Gatha Mayer, MD as Consulting Physician (Gastroenterology) Minus Breeding, MD as Consulting Physician (Cardiology) Luretha Rued, RN as [place] any recent [place] you may have received from other than [facility] providers in the past year (date may be approximate).     Assessment:   This is a routine wellness examination for Todd [name].  Hearing/Vision screen No exam data present  Dietary issues and exercise activities discussed: Current Exercise Habits: Home exercise routine, Type of exercise: walking, Time (Minutes): 30, Frequency (Times/Week): 5, Weekly Exercise (Minutes/Week): 150, Intensity: Moderate, Exercise limited by: respiratory conditions(s);cardiac condition(s)  Goals    .  <enter goal here> (pt-stated)      Goal weight is 190:  Continue to be as active as possible, continue to eat a nutritious well balanced diet.    .  Patient Stated      I would like to get back to going to the gym. I will just go to the gym 3 times a week.      Depression Screen PHQ 2/9 Scores 06/08/2020 05/07/2020 05/06/2019 10/31/2018 10/29/2017 05/01/2017 04/27/2016  PHQ - 2 Score 0 0 0 0 0 0 0  PHQ- 9 Score - - - - 2 - -    Fall Risk Fall Risk  06/08/2020 11/05/2019 10/31/2018 10/29/2017 05/01/2017  Falls in the past year? 0 0 0 No No  Number falls in past yr: 0 0 0 - -  Injury with Fall? 0 0 - - -  Risk for fall due to : No Fall Risks - - - -  Follow up Falls evaluation completed - - - -    Any stairs in or around the home? No  If so, are there any without handrails? No  Home free of loose throw rugs in walkways, pet beds, electrical cords, etc? Yes  Adequate lighting in your home to reduce risk of falls? Yes   ASSISTIVE DEVICES UTILIZED TO PREVENT FALLS:  Life alert? No  Use of a cane, walker or w/c? No  Grab bars in the bathroom? No  Shower chair or bench in shower? No  Elevated toilet seat or a handicapped toilet? No    TIMED UP AND GO:  Was the test performed? No .  Length of time  to ambulate 10 feet: 0 sec.   Gait steady and fast without use of assistive device  Cognitive Function: MMSE - Mini Mental State Exam 01/15/2015  Not completed: Unable to complete        Immunizations Immunization History  Administered Date(s) Administered  . Fluad Quad(high Dose 65+) 05/30/2019  . Influenza Split 07/06/2011, 05/15/2012  . Influenza Whole 05/12/2010  . Influenza, High Dose Seasonal PF 05/30/2017, 05/02/2018  . Influenza,inj,Quad PF,6+ Mos 05/21/2013  . Influenza-Unspecified 05/20/2014, 06/11/2015, 05/24/2016  . PFIZER SARS-COV-2 Vaccination 10/13/2019, 11/03/2019  . Pneumococcal Conjugate-13 01/15/2015  . Pneumococcal Polysaccharide-23 05/22/2005, 05/05/2010  . Tdap 12/03/2012  . Zoster 10/20/2010  . Zoster Recombinat (Shingrix) 12/19/2017, 03/04/2018    TDAP status: Up to date Flu Vaccine status: Up to date Pneumococcal vaccine status: Up to date Covid-19 vaccine status: Completed vaccines  Qualifies for Shingles Vaccine? Yes   Zostavax completed Yes   Shingrix Completed?: Yes  Screening Tests Health Maintenance  Topic Date Due  . OPHTHALMOLOGY EXAM  11/30/2017  . FOOT EXAM  10/31/2019  . INFLUENZA VACCINE  03/21/2020  . Hepatitis C Screening  05/07/2069 (Originally 1941-08-21)  . HEMOGLOBIN A1C  11/04/2020  . URINE MICROALBUMIN  05/07/2021  . TETANUS/TDAP  12/04/2022  . COVID-19 Vaccine  Completed  . PNA vac Low Risk Adult  Completed    Health Maintenance  Health Maintenance Due  Topic Date Due  . OPHTHALMOLOGY EXAM  11/30/2017  . FOOT EXAM  10/31/2019  . INFLUENZA VACCINE  03/21/2020    Colorectal cancer screening: No longer required.   Lung Cancer Screening: (Low Dose CT Chest recommended if Age 21-80 years, 30 pack-year currently smoking OR have quit w/in 15years.) does not qualify.   Lung Cancer Screening Referral: no  Additional Screening:  Hepatitis C  Screening: does not qualify; Completed no  Vision Screening: Recommended annual ophthalmology exams for early detection of glaucoma and other disorders of the eye. Is the patient up to date with their annual eye exam?  Yes  Who is the provider or what is the name of the office in which the patient attends annual eye exams? Vevelyn Royals, MD If pt is not established with a provider, would they like to be referred to a provider to establish care? No .   Dental Screening: Recommended annual dental exams for proper oral hygiene  Community Resource Referral / Chronic Care Management: CRR required this visit?  No   CCM required this visit?  No      Plan:     I have personally reviewed and noted the following in the patient's chart:   . Medical and social history . Use of alcohol, tobacco or illicit drugs  . Current medications and supplements . Functional ability and status . Nutritional status . Physical activity . Advanced directives . List of other physicians . Hospitalizations, surgeries, and ER visits in previous 12 months . Vitals . Screenings to include cognitive, depression, and falls . Referrals and appointments  In addition, I have reviewed and discussed with patient certain preventive protocols, quality metrics, and best practice recommendations. A written personalized care plan for preventive services as well as general preventive health recommendations were provided to patient.     Sheral Flow, LPN   [id number]   Nurse Notes:  Patient is cogitatively intact. There were no vitals filed for this visit. There is no height or weight on file to calculate BMI. Patient stated that he has no issues with gait or balance; does  not use any assistive devices.

## 2020-06-09 ENCOUNTER — Encounter: Payer: Self-pay | Admitting: Internal Medicine

## 2020-06-09 DIAGNOSIS — H18513 Endothelial corneal dystrophy, bilateral: Secondary | ICD-10-CM | POA: Diagnosis not present

## 2020-06-09 DIAGNOSIS — H16223 Keratoconjunctivitis sicca, not specified as Sjogren's, bilateral: Secondary | ICD-10-CM | POA: Diagnosis not present

## 2020-06-09 DIAGNOSIS — H26491 Other secondary cataract, right eye: Secondary | ICD-10-CM | POA: Diagnosis not present

## 2020-06-09 DIAGNOSIS — H18413 Arcus senilis, bilateral: Secondary | ICD-10-CM | POA: Diagnosis not present

## 2020-06-10 MED ORDER — GLIPIZIDE 5 MG PO TABS: 5.0000 mg | ORAL_TABLET | Freq: Two times a day (BID) | ORAL | 3 refills | Status: DC

## 2020-06-13 MED ORDER — FLUTICASONE-SALMETEROL 100-50 MCG/DOSE IN AEPB: 1.0000 | INHALATION_SPRAY | Freq: Two times a day (BID) | RESPIRATORY_TRACT | 3 refills | Status: DC

## 2020-06-13 NOTE — Addendum Note (Signed)
Addended by: Binnie Rail on: 06/13/2020 05:22 PM   Modules accepted: Orders

## 2020-06-15 ENCOUNTER — Encounter: Payer: Self-pay | Admitting: Internal Medicine

## 2020-06-17 ENCOUNTER — Ambulatory Visit: Payer: Self-pay

## 2020-06-19 ENCOUNTER — Other Ambulatory Visit: Payer: Self-pay | Admitting: Internal Medicine

## 2020-06-19 DIAGNOSIS — I10 Essential (primary) hypertension: Secondary | ICD-10-CM

## 2020-06-21 ENCOUNTER — Telehealth: Payer: Self-pay | Admitting: Pharmacist

## 2020-06-21 ENCOUNTER — Encounter: Payer: Self-pay | Admitting: Internal Medicine

## 2020-06-21 ENCOUNTER — Telehealth: Payer: HMO | Admitting: Internal Medicine

## 2020-06-21 ENCOUNTER — Telehealth: Payer: Self-pay | Admitting: Internal Medicine

## 2020-06-21 ENCOUNTER — Telehealth: Payer: Self-pay | Admitting: Pulmonary Disease

## 2020-06-21 DIAGNOSIS — R059 Cough, unspecified: Secondary | ICD-10-CM

## 2020-06-21 MED ORDER — PREDNISONE 20 MG PO TABS: 40.0000 mg | ORAL_TABLET | Freq: Every day | ORAL | 0 refills | Status: DC

## 2020-06-21 NOTE — Progress Notes (Signed)
Patient has [employer]TA insurance and reports copay for Xarelto is cost prohibitive at this time.  Reviewed application process for [employer] patient assistance program. Patient meets income/out of pocket spend criteria for the program. Patient will provide proof of income, out of pocket spend report, and will sign application. Will collaborate with prescriber for the provider portion of application. Once completed, application will be submitted via Fax   Patient assistance program Fax number: [phone]  Patient's wife is bringing income/OOP costs to office this week and will sign application then.  Charlton Haws, [facility]

## 2020-06-21 NOTE — Telephone Encounter (Signed)
Send in a prescription for prednisone 40 mg a day for 5 days

## 2020-06-21 NOTE — Telephone Encounter (Signed)
Patient's spouse, Judy(DPR) is aware of below recommendations and voiced her understanding.  Rx for prednisone has been sent to preferred pharmacy. Nothing further needed.

## 2020-06-21 NOTE — Telephone Encounter (Signed)
Spoke to pt's wife. Pt had covid on 05/19/20 and received antibody infusion 05/23/20. Pt continues to have non prod cough, chest congestion that he cant break loose. Some SOB on exertion, sinus drainage (? Color), occas wheezing, difficulty sleeping due to cough. Pt's PCP gave him a sample of Breztri that he is currently using instead of Advair. Pt taking Mucinex DM one tablet a day. Please advise.

## 2020-06-21 NOTE — Progress Notes (Signed)
  Chronic Care Management   Note  06/21/2020 Name: Todd [name] MRN: [id number] DOB: 24-Aug-1940  Todd [name] is a 79 y.o. year old male who is a primary care patient of Burns, [name], MD. I reached out to [place] by phone today in response to a referral sent by Todd [name]'s PCP, Binnie Rail, MD.   Todd [name] was given information about Chronic Care Management services today including:  1. CCM service includes personalized support from designated clinical staff supervised by his physician, including individualized plan of care and coordination with other care providers 2. 24/7 contact phone numbers for assistance for urgent and routine care needs. 3. Service will only be billed when office clinical staff spend 20 minutes or more in a month to coordinate care. 4. Only one practitioner may furnish and bill the service in a calendar month. 5. The patient may stop CCM services at any time (effective at the end of the month) by phone call to the office staff.   Patient agreed to services and verbal consent obtained.   Follow up plan:   Carley Perdue [employer] Scheduler

## 2020-07-02 ENCOUNTER — Other Ambulatory Visit: Payer: Self-pay | Admitting: Cardiology

## 2020-07-05 NOTE — Telephone Encounter (Signed)
Rx has been sent to the pharmacy electronically. ° °

## 2020-07-26 NOTE — Telephone Encounter (Signed)
Patient was denied patient assistance due to failure to meet 4% out of pocket spend on prescription drugs in 2021. His household would need to spend >$975 to qualify.

## 2020-07-28 ENCOUNTER — Encounter: Payer: Self-pay | Admitting: Internal Medicine

## 2020-07-28 ENCOUNTER — Telehealth (INDEPENDENT_AMBULATORY_CARE_PROVIDER_SITE_OTHER): Payer: HMO | Admitting: Internal Medicine

## 2020-07-28 DIAGNOSIS — J3489 Other specified disorders of nose and nasal sinuses: Secondary | ICD-10-CM | POA: Diagnosis not present

## 2020-07-28 DIAGNOSIS — E1151 Type 2 diabetes mellitus with diabetic peripheral angiopathy without gangrene: Secondary | ICD-10-CM

## 2020-07-28 DIAGNOSIS — R059 Cough, unspecified: Secondary | ICD-10-CM

## 2020-07-28 MED ORDER — ALBUTEROL SULFATE HFA 108 (90 BASE) MCG/ACT IN AERS: 2.0000 | INHALATION_SPRAY | Freq: Four times a day (QID) | RESPIRATORY_TRACT | 0 refills | Status: DC | PRN

## 2020-07-28 MED ORDER — AZITHROMYCIN 250 MG PO TABS: ORAL_TABLET | ORAL | 0 refills | Status: DC

## 2020-07-28 MED ORDER — FLUTICASONE PROPIONATE 50 MCG/ACT NA SUSP: 2.0000 | Freq: Every day | NASAL | 6 refills | Status: DC

## 2020-07-28 NOTE — Assessment & Plan Note (Signed)
Acute Has been coughing for the most part since his COVID-19 infection the end of September, but it did get worse after he started having sinus drainage that has been persistent and excessive ?  Sinus infection which we will treat with Flonase and Z-Pak.  He will continue the Allegra We will also try albuterol inhaler every 6 hours as needed Last month he was on oral prednisone and that may have helped some, but it did elevate his sugars so we will try to avoid oral steroids if possible If the above does not work we may need to consider oral steroids

## 2020-07-28 NOTE — Progress Notes (Signed)
Virtual Visit via Telephone Note  I connected with Todd [name] on 07/28/20 at  9:30 AM EST by telephone and verified that I am speaking with the correct person using two identifiers.   I discussed the limitations of evaluation and management by telemedicine and the availability of in person appointments. The patient expressed understanding and agreed to proceed.  Present for the visit:  Myself, Dr Billey Gosling, Todd [name], his wife Todd [name] is also on the line..  The patient is currently at home and I am in the office.    No referring provider.    History of Present Illness: This is an acute visit for cold symptoms  His cough has not left since having covid.  He has been having drainage and he feels that is causing most of his cough.  He feels if he can stop the drainage the cough will improve.  He states postnasal drainage.  He denies any sinus pain, ear pain, sore throat or fevers.  Does have an occasional wheeze, which is not necessarily new for him.  He denies any shortness of breath.  He does have some headaches intermittently and feels is related to coughing.  He is coughing almost constantly.  Over-the-counter Mucinex DM and Delsym are not helping.  He is taking Allegra daily.  He states overall he feels okay is just this persistent cough.  He is not able to bring anything up when he coughs.  He did go on steroids just over a month ago and he did not feel that that helped his cough, but his wife thinks it may have.  He had Covid the end of September and had the antibody infusion 10/3.  He has pulmonary fibrosis and asbestosis.  Review of Systems  Constitutional: Negative for fever.  HENT: Negative for ear pain, sinus pain and sore throat.        PND  Respiratory: Positive for cough and wheezing (occ). Negative for shortness of breath.   Neurological: Positive for headaches.      Social History   Socioeconomic History  . Marital status: Married    Spouse name: Todd [name]  . Number of  children: 2  . Years of education: Not on file  . Highest education level: Not on file  Occupational History  . Occupation: Sales Manager-retired  Tobacco Use  . Smoking status: Never Smoker  . Smokeless tobacco: Never Used  Vaping Use  . Vaping Use: Never used  Substance and Sexual Activity  . Alcohol use: No    Alcohol/week: 0.0 standard drinks  . Drug use: No  . Sexual activity: Yes  Other Topics Concern  . Not on file  Social History Narrative  . Not on file   Social Determinants of Health   Financial Resource Strain: Low Risk   . Difficulty of Paying Living Expenses: Not hard at all  Food Insecurity: No Food Insecurity  . Worried About Charity fundraiser in the Last Year: Never true  . Ran Out of Food in the Last Year: Never true  Transportation Needs: No Transportation Needs  . Lack of Transportation (Medical): No  . Lack of Transportation (Non-Medical): No  Physical Activity: Sufficiently Active  . Days of Exercise per Week: 5 days  . Minutes of Exercise per Session: 30 min  Stress: No Stress Concern Present  . Feeling of Stress : Not at all  Social Connections: Socially Integrated  . Frequency of Communication with Friends and Family: More than three times a  week  . Frequency of Social Gatherings with Friends and Family: Once a week  . Attends Religious Services: More than 4 times per year  . Active Member of Clubs or Organizations: No  . Attends Archivist Meetings: More than 4 times per year  . Marital Status: Married     Observations/Objective:    Assessment and Plan:  See Problem List for Assessment and Plan of chronic medical problems.   Follow Up Instructions:    I discussed the assessment and treatment plan with the patient. The patient was provided an opportunity to ask questions and all were answered. The patient agreed with the plan and demonstrated an understanding of the instructions.   The patient was advised to call back or  seek an in-person evaluation if the symptoms worsen or if the condition fails to improve as anticipated.  Time spent on telephone call -13 minutes.  Binnie Rail, MD

## 2020-07-28 NOTE — Assessment & Plan Note (Signed)
Chronic Sugars have been well controlled at home We will try to avoid oral steroids for his current symptoms of possible Continue glipizide 5 mg twice daily, Metformin XR 1000 mg twice daily

## 2020-07-28 NOTE — Assessment & Plan Note (Signed)
Acute He has significant sinus drainage that is causing a significant cough Intermittent headaches-he thinks they are related to coughing, but no fever or sinus pain He is taking Allegra daily ?  Possible sinus infection Will start Z-Pak, Flonase-hopefully this will stop some of the drainage which will improve his cough Can continue any over-the-counter medications such as Mucinex and cough syrup

## 2020-08-05 ENCOUNTER — Other Ambulatory Visit: Payer: Self-pay

## 2020-08-05 NOTE — Patient Outreach (Signed)
  [facility]) Care Management Chronic Special Needs Program    08/05/2020  Name: Todd [name], DOB: 1941-01-25  MRN: [id number]   [name] is enrolled in a chronic special needs plan for Diabetes. [facility] will continue to provide services for this member through 08/20/2020. The [facility] Care Management Team will assume care 08/21/2020.   Thea Silversmith, RN, MSN, [place] [phone]

## 2020-08-10 ENCOUNTER — Telehealth: Payer: Self-pay | Admitting: Pharmacist

## 2020-08-10 DIAGNOSIS — H26491 Other secondary cataract, right eye: Secondary | ICD-10-CM | POA: Diagnosis not present

## 2020-08-10 DIAGNOSIS — E119 Type 2 diabetes mellitus without complications: Secondary | ICD-10-CM | POA: Diagnosis not present

## 2020-08-10 DIAGNOSIS — G245 Blepharospasm: Secondary | ICD-10-CM | POA: Diagnosis not present

## 2020-08-10 DIAGNOSIS — Z961 Presence of intraocular lens: Secondary | ICD-10-CM | POA: Diagnosis not present

## 2020-08-10 NOTE — Progress Notes (Signed)
Chronic Care Management Pharmacy Assistant   Name: Todd [name]  MRN: [id number] DOB: November 16, 1940  Reason for Encounter: Initial Questions   PCP : [name], MD  Allergies:   Allergies  Allergen Reactions  . Amlodipine Besylate     Rash Because of a history of documented adverse serious drug reaction;Medi Alert bracelet  is recommended  . Ivp Dye [Iodinated Diagnostic Agents]     Rash Because of a history of documented adverse serious drug reaction;Medi Alert bracelet  is recommended    Medications: Outpatient Encounter Medications as of 08/10/2020  Medication Sig  . albuterol (VENTOLIN HFA) 108 (90 Base) MCG/ACT inhaler Inhale 2 puffs into the lungs every 6 (six) hours as needed for wheezing or shortness of breath.  Marland Kitchen azithromycin (ZITHROMAX) 250 MG tablet Take two tabs the first day and then one tab daily for four days  . fexofenadine (ALLEGRA) 180 MG tablet Take 180 mg by mouth daily.  . fluticasone (FLONASE) 50 MCG/ACT nasal spray Place 2 sprays into both nostrils daily.  . Fluticasone-Salmeterol (ADVAIR) 100-50 MCG/DOSE AEPB Inhale 1 puff into the lungs 2 (two) times daily.  Marland Kitchen glipiZIDE (GLUCOTROL) 5 MG tablet Take 1 tablet (5 mg total) by mouth 2 (two) times daily before a meal.  . glucose blood (ONE TOUCH ULTRA TEST) test strip Test blood sugar once daily  . metFORMIN (GLUCOPHAGE-XR) 500 MG 24 hr tablet TAKE 2 TABLETS BY MOUTH TWICE A DAY  . metoprolol tartrate (LOPRESSOR) 50 MG tablet TAKE 1 TABLET BY MOUTH 2 TIMES DAILY.  . nitroGLYCERIN (NITROSTAT) 0.4 MG SL tablet Place 1 tablet (0.4 mg total) under the tongue every 5 (five) minutes as needed for chest pain.  . Omega-3 Fatty Acids (FISH OIL PO) Take 1 tablet by mouth daily.  Marland Kitchen omeprazole (PRILOSEC) 20 MG capsule TAKE 1 CAPSULE BY MOUTH DAILY.  Marland Kitchen ONETOUCH DELICA LANCETS 33G MISC TEST BLOOD SUGAR ONCE DAILY  . simvastatin (ZOCOR) 40 MG tablet TAKE 1 TABLET BY MOUTH EVERY EVENING.  Marland Kitchen triamcinolone cream (KENALOG)  0.1 %   . XARELTO 20 MG TABS tablet TAKE 1 TABLET BY MOUTH DAILY WITH SUPPER.   No facility-administered encounter medications on file as of 08/10/2020.    Current Diagnosis: Patient Active Problem List   Diagnosis Date Noted  . Sinus drainage 07/28/2020  . Aortic atherosclerosis (HCC) 05/07/2020  . Educated about COVID-19 virus infection 08/07/2019  . GERD (gastroesophageal reflux disease) 05/06/2019  . Pulmonary fibrosis (HCC) 05/05/2019  . Asbestosis (HCC) 05/05/2019  . Ventral hernia without obstruction or gangrene 10/31/2018  . Umbilical hernia without obstruction or gangrene 10/31/2018  . Eczema 10/29/2017  . Hives 08/13/2017  . Cough 12/04/2016  . 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 Hemorrhoids, internal, with bleeding 01/15/2015  . Bursitis of left shoulder 01/14/2015  . Type 2 diabetes, controlled, with peripheral circulatory disorder (HCC) 12/05/2013  . PVD (peripheral vascular disease) (HCC) 11/12/2013  . Atrial fibrillation (HCC) 11/03/2013  . Carotid artery disease (HCC) 12/20/2009  . OVERWEIGHT/OBESITY 11/17/2008  . Hyperlipidemia 07/20/2008  . Essential hypertension 07/20/2008  . Coronary atherosclerosis 07/20/2008  . ALLERGIC RHINITIS 07/20/2008  . NEPHROLITHIASIS, HX OF 07/20/2008    Goals Addressed   None     Follow-Up:  Pharmacist Review   Have you seen any other providers since your last visit? Spoke with patient wife who states that the patient last saw Dr. Lawerance Bach on 12/8  Any changes in your medications or health? The patient wife states  that Dr. Quay Burow started him on an inhaler for his cough  Any side effects from any medications? The wife states that as far as she knows he has no side affects from any medications  Do you have an symptoms or problems not managed by your medications? The wife states that the patient does not have any symptoms or problems that are not managed by medications  Any concerns about your health right now? The patient wife  states that the only concern is the patients persistent cough that he has had since September after he has covid. She states that she was concerned because he did not ever have a chest xray done to see if it was pneumonia  Has your provider asked that you check blood pressure, blood sugar, or follow special diet at home? The wife states that the patient does not check blood pressure or sugar regularly but it is being control with medications  Do you get any type of exercise on a regular basis? The wife states that the patient does walk on occassions when possible  Can you think of a goal you would like to reach for your health? The wife states that the patient would like to lose a few more pounds  Do you have any problems getting your medications? The patient wife states that they have no problem with getting their medications from the pharmacy  Is there anything that you would like to discuss during the appointment? The patient wife states that he may want to discuss this persistent cough that he can't seem to get rid of.  Please bring medications and supplements to appointment   Wendy Poet, [place]

## 2020-08-10 NOTE — Chronic Care Management (AMB) (Signed)
Chronic Care Management Pharmacy  Name: Todd [name]  MRN: [id number] DOB: [birth year]   Chief Complaint/ HPI  Todd [name],  79 y.o. , male presents for his Initial CCM visit with the clinical pharmacist In office.  PCP : Binnie Rail, MD Patient Care Team: Binnie Rail, MD as PCP - General (Internal Medicine) Minus Breeding, MD as PCP - Cardiology (Cardiology) Gatha Mayer, MD as Consulting Physician (Gastroenterology) Minus Breeding, MD as Consulting Physician (Cardiology) Luretha Rued, RN as [place] [facility] [place], Peterson Ao, MD as Consulting Physician (Ophthalmology) Darleen Crocker, MD as Consulting Physician (Ophthalmology) Charlton Haws, [facility] as Pharmacist (Pharmacist)  Patient's chronic conditions include: Hypertension, Hyperlipidemia, Diabetes, Atrial Fibrillation, Coronary Artery Disease, GERD and Allergic Rhinitis, PVD, pulmonary fibrosis, hemorrhoids  Office Visits: 07/28/20 Dr Todd [name] VV: sinus infection, rx'd albuterol, zpak and flonase. 06/09/20 pt message: stopped Rybelsus due to cost, switched to glipizide.  05/07/20 Dr Todd [name] OV: chronic f/u. A1c up, switched glimepiride to Rybelsus 3 mg.  Consult Visit: 05/23/20 Regeneron infusion (covid-19)  04/19/20 Dr Todd [name] (pulmonary): f/u for ILD/pulmonary fibrosis d/t asbestos. Decided to hold off treatment w/ anti-fibrotics d/t stable disease, lack of symptoms.   Subjective: Patient lives at home with wife. He works outside often. He has a persistent cough since recovering from Covid in September. His wife organizes his medications for him.  Objective: Allergies  Allergen Reactions  . Amlodipine Besylate     Rash Because of a history of documented adverse serious drug reaction;Medi Alert bracelet  is recommended  . Ivp Dye [Iodinated Diagnostic Agents]     Rash Because of a history of documented adverse serious drug reaction;Medi Alert bracelet  is recommended     Medications: Outpatient Encounter Medications as of 08/11/2020  Medication Sig  . albuterol (VENTOLIN HFA) 108 (90 Base) MCG/ACT inhaler Inhale 2 puffs into the lungs every 6 (six) hours as needed for wheezing or shortness of breath.  . cholecalciferol (VITAMIN D3) 25 MCG (1000 UNIT) tablet Take 1,000 Units by mouth daily.  . fexofenadine (ALLEGRA) 180 MG tablet Take 180 mg by mouth daily.  . fluticasone (FLONASE) 50 MCG/ACT nasal spray Place 2 sprays into both nostrils daily.  Marland Kitchen glipiZIDE (GLUCOTROL) 5 MG tablet Take 1 tablet (5 mg total) by mouth 2 (two) times daily before a meal.  . glucose blood (ONE TOUCH ULTRA TEST) test strip Test blood sugar once daily  . metFORMIN (GLUCOPHAGE-XR) 500 MG 24 hr tablet TAKE 2 TABLETS BY MOUTH TWICE A DAY  . metoprolol tartrate (LOPRESSOR) 50 MG tablet TAKE 1 TABLET BY MOUTH 2 TIMES DAILY.  . nitroGLYCERIN (NITROSTAT) 0.4 MG SL tablet Place 1 tablet (0.4 mg total) under the tongue every 5 (five) minutes as needed for chest pain.  . Omega-3 Fatty Acids (FISH OIL PO) Take 1 tablet by mouth daily.  Marland Kitchen omeprazole (PRILOSEC) 20 MG capsule TAKE 1 CAPSULE BY MOUTH DAILY.  Marland Kitchen ONETOUCH DELICA LANCETS 10C MISC TEST BLOOD SUGAR ONCE DAILY  . simvastatin (ZOCOR) 40 MG tablet TAKE 1 TABLET BY MOUTH EVERY EVENING.  Marland Kitchen triamcinolone cream (KENALOG) 0.1 %   . XARELTO 20 MG TABS tablet TAKE 1 TABLET BY MOUTH DAILY WITH SUPPER.  . [DISCONTINUED] azithromycin (ZITHROMAX) 250 MG tablet Take two tabs the first day and then one tab daily for four days  . [DISCONTINUED] Fluticasone-Salmeterol (ADVAIR) 100-50 MCG/DOSE AEPB Inhale 1 puff into the lungs 2 (two) times daily. (Patient not taking: Reported on 08/12/2020)  No facility-administered encounter medications on file as of 08/11/2020.    Wt Readings from Last 3 Encounters:  05/07/20 174 lb 6.4 oz (79.1 kg)  04/19/20 196 lb 9.6 oz (89.2 kg)  01/21/20 197 lb 3.2 oz (89.4 kg)    Lab Results  Component Value Date    CREATININE 0.93 05/07/2020   BUN 18 05/07/2020   GFR 69.29 11/05/2019   GFRNONAA 79 05/07/2020   GFRAA 91 05/07/2020   NA 142 05/07/2020   K 4.6 05/07/2020   CALCIUM 9.3 05/07/2020   CO2 24 05/07/2020     Current Diagnosis/Assessment:    Goals Addressed            This Visit's Progress   . Pharmacy Care Plan       CARE PLAN ENTRY (see longitudinal plan of care for additional care plan information)  Current Barriers:  . Chronic Disease Management support, education, and care coordination needs related to Hypertension, Hyperlipidemia, Diabetes, and Atrial Fibrillation   Hypertension / Atrial fibrillation BP Readings from Last 3 Encounters:  05/23/20 131/81  05/07/20 138/80  04/19/20 126/70   Pulse Readings from Last 3 Encounters:  05/23/20 90  05/07/20 81  04/19/20 77 .  Pharmacist Clinical Goal(s): o Over the next 90 days, patient will work with PharmD and providers to maintain BP goal <130/80 and HR < 110 . Current regimen:  o Metoprolol tartrate 50 mg BID o Xarelto 20 mg daily . Interventions: o Discussed BP goals and benefits of medications for prevention of heart attack / stroke . Patient self care activities - Over the next 90 days, patient will: o Check BP 1-2 times weekly, document, and provide at future appointments o Ensure daily salt intake < 2300 mg/day  Hyperlipidemia / CAD Lab Results  Component Value Date/Time   LDLCALC 55 11/05/2019 08:29 AM .  Pharmacist Clinical Goal(s): o Over the next 90 days, patient will work with PharmD and providers to maintain LDL goal < 70 . Current regimen:  o Simvastatin 40 mg daily o OTC omega-3 fatty acids  . Interventions: o Discussed cholesterol goals and benefits of medications for prevention of heart attack / stroke . Patient self care activities - Over the next 90 days, patient will: o Continue current medications  Diabetes Lab Results  Component Value Date/Time   HGBA1C 8.7 (H) 05/07/2020 10:22 AM    HGBA1C 8.3 (H) 11/05/2019 08:29 AM .  Pharmacist Clinical Goal(s): o Over the next 90 days, patient will work with PharmD and providers to achieve A1c goal <7% . Current regimen:  o Metformin ER 500 mg - 2 tab twice a day o Glipizide 5 mg twice a day . Interventions: o Discussed importance of maintaining sugars at goal to prevent complications of diabetes including kidney damage, retinal damage, and cardiovascular disease o Recommend to pursue patient assistance for Rybelsus. Once approved, switch glipizide to Rybelsus . Patient self care activities - Over the next 30 days, patient will: o Check blood sugar in the morning before eating or drinking, document, and provide at future appointments o Contact provider with any episodes of hypoglycemia  Medication management . Pharmacist Clinical Goal(s): o Over the next 90 days, patient will work with PharmD and providers to maintain optimal medication adherence . Current pharmacy: [facility] . Interventions o Comprehensive medication review performed. o Continue current medication management strategy . Patient self care activities - Over the next 90 days, patient will: o Focus on medication adherence by pill box o Take  medications as prescribed o Report any questions or concerns to PharmD and/or provider(s)  Initial goal documentation       AFIB   Patient is currently rate controlled. Office heart rates are  Pulse Readings from Last 3 Encounters:  05/23/20 90  05/07/20 81  04/19/20 77   CHA2DS2-VASc Score = 5  The patient's score is based upon: CHF History: No HTN History: Yes Diabetes History: Yes Stroke History: No Vascular Disease History: Yes Age Score: 2 Gender Score: 0  BP goal is:  <130/80  Office blood pressures are  BP Readings from Last 3 Encounters:  05/23/20 131/81  05/07/20 138/80  04/19/20 126/70   Patient checks BP at home 1-2x per week Patient home BP readings are ranging: "normal"  Patient has  failed these meds in past: n/a Patient is currently controlled on the following medications:  Marland Kitchen Metoprolol tartrate 50 mg BID . Xarelto 20 mg daily  We discussed:  Benefits of medications; BP and HR goals; pt denies side effects or bleeding issues; Xarelto cost is high in [place]ut hole but he did not qualify for patient assistance  Plan  Continue current medications   Hyperlipidemia    LDL goal < 70 Atherosclerosis PVD  Last lipids Lab Results  Component Value Date   CHOL 115 11/05/2019   HDL 33.00 (L) 11/05/2019   LDLCALC 55 11/05/2019   TRIG 131.0 11/05/2019   CHOLHDL 3 11/05/2019   Hepatic Function Latest Ref Rng & Units 05/07/2020 11/05/2019 05/06/2019  Total Protein 6.1 - 8.1 g/dL 7.4 7.5 7.6  Albumin 3.5 - 5.2 g/dL - 4.0 4.3  AST 10 - 35 U/L 30 18 25   ALT 9 - 46 U/L 24 18 21   Alk Phosphatase 39 - 117 U/L - 70 67  Total Bilirubin 0.2 - 1.2 mg/dL 0.5 0.7 0.8  Bilirubin, Direct <=0.2 mg/dL - - -     The ASCVD Risk score ([place]., et al., 2013) failed to calculate for the following reasons:   The valid total cholesterol range is 130 to 320 mg/dL   Patient has failed these meds in past: n/a Patient is currently controlled on the following medications:  . Simvastatin 40 mg daily . Nitroglycerin 0.4 mg SL prn - never used . OTC omega-3 fatty acids   We discussed:  diet and exercise extensively; Cholesterol goals; benefits of statin for ASCVD risk reduction  Plan  Continue current medications  Diabetes   A1c goal <7%  Recent Relevant Labs: Lab Results  Component Value Date/Time   HGBA1C 8.7 (H) 05/07/2020 10:22 AM   HGBA1C 8.3 (H) 11/05/2019 08:29 AM   GFR 69.29 11/05/2019 08:29 AM   GFR 70.16 05/06/2019 08:53 AM   MICROALBUR 1.9 05/07/2020 10:24 AM   MICROALBUR 1.2 05/06/2019 08:53 AM    Last diabetic Eye exam:  Lab Results  Component Value Date/Time   HMDIABEYEEXA No Retinopathy 11/30/2016 12:00 AM   HMDIABEYEEXA No Retinopathy 11/30/2016 12:00 AM    HMDIABEYEEXA No Retinopathy 11/30/2016 12:00 AM    Last diabetic Foot exam: No results found for: HMDIABFOOTEX   Checking BG: Daily  Fasting BG: 140-190  Patient has failed these meds in past: glimepride (2016-2021), Rybelsus, Januvia, Onglyza Patient is currently uncontrolled on the following medications: Marland Kitchen Metformin ER 500 mg - 2 tab BID . Glipizide 5 mg BID . Testing supplies  We discussed: diet and exercise extensively; BG and A1c goals; pt could not afford Rybelsus previously, however Rybelsus would be a better  option for him for CV risk reduction and weight loss effects; pt should qualify for [employer] patient assistance and is willing to switch glipizide to Rybelsus  Plan  Recommend to pursue patient assistance for Rybelsus - once approved, switch glipizide to Rybelsus   Pulmonary Fibrosis   Last spirometry score 01/15/2018: -FEV1 86% predicted -FEV1/FVC 0.85 -FEV1 % change 7% post-bronchodilator  Patient has failed these meds in past: Advair Patient is currently controlled on the following medications:  . Albuterol HFA prn  Using maintenance inhaler regularly? No Frequency of rescue inhaler use:  daily  We discussed:  proper inhaler technique - pt demonstrated technique in office, he coughs immediately after administering the dose and reports that always happens; discussed importance of holding breath for at least 5 seconds after inhaling dose -of note pt reports he has no issues with breathing, just persistent cough. He has not noticed improvement with inhalers, but as above this may be because he is not absorbing most of the dose due to coughing -may consider a Respimat inhaler in future if needed, the mist may be more tolerable  Plan  Use proper inhaler technique including holding breath after inhaling dose  Allergic rhinitis   Patient has failed these meds in past: n/a Patient is currently controlled on the following medications:  . Fexofenadine 180 mg  daily . Fluticasone nasal spray PRN  We discussed:  Pt believes nasal drainage is main contributor to his coughing issues; advised to use Flonase on a daily basis to help reduce drainage  Plan  Continue current medications  Use flonase daily  GERD   Patient has failed these meds in past: n/a Patient is currently controlled on the following medications:  . Omeprazole 20 mg daily  We discussed: Pt denies heartburn/reflux but he did have issues swallowing previously and PPI helps with this  Plan  Continue current medications  Vaccines   Reviewed and discussed patient's vaccination history.    Immunization History  Administered Date(s) Administered  . Fluad Quad(high Dose 65+) 05/30/2019  . Influenza Split 07/06/2011, 05/15/2012  . Influenza Whole 05/12/2010  . Influenza, High Dose Seasonal PF 05/30/2017, 05/02/2018, 06/12/2020  . Influenza,inj,Quad PF,6+ Mos 05/21/2013  . Influenza-Unspecified 05/20/2014, 06/11/2015, 05/24/2016  . PFIZER SARS-COV-2 Vaccination 10/13/2019, 11/03/2019  . Pneumococcal Conjugate-13 01/15/2015  . Pneumococcal Polysaccharide-23 05/22/2005, 05/05/2010  . Tdap 12/03/2012  . Zoster 10/20/2010  . Zoster Recombinat (Shingrix) 12/19/2017, 03/04/2018    Plan  Recommended patient receive covid booster at least 90 days after antibody infusion (mid-January)  Medication Management   Patient's preferred pharmacy is:  [facility], [address] [place] [address] Phone: [phone] Fax: [phone]  Uses pill box? Yes Pt endorses 100% compliance  We discussed: Current pharmacy is preferred with insurance plan and patient is satisfied with pharmacy services  Plan  Continue current medication management strategy    Follow up: 3 month phone visit  Charlene Brooke, PharmD, BCACP Clinical Pharmacist [facility] at [facility] [phone]

## 2020-08-11 ENCOUNTER — Ambulatory Visit: Payer: HMO | Admitting: Pharmacist

## 2020-08-11 ENCOUNTER — Other Ambulatory Visit: Payer: Self-pay

## 2020-08-11 DIAGNOSIS — I10 Essential (primary) hypertension: Secondary | ICD-10-CM

## 2020-08-11 DIAGNOSIS — I4891 Unspecified atrial fibrillation: Secondary | ICD-10-CM

## 2020-08-11 DIAGNOSIS — E1151 Type 2 diabetes mellitus with diabetic peripheral angiopathy without gangrene: Secondary | ICD-10-CM

## 2020-08-11 DIAGNOSIS — E7849 Other hyperlipidemia: Secondary | ICD-10-CM

## 2020-08-12 ENCOUNTER — Telehealth: Payer: Self-pay | Admitting: Pulmonary Disease

## 2020-08-12 DIAGNOSIS — J849 Interstitial pulmonary disease, unspecified: Secondary | ICD-10-CM

## 2020-08-12 MED ORDER — PREDNISONE 20 MG PO TABS: 20.0000 mg | ORAL_TABLET | Freq: Every day | ORAL | 0 refills | Status: DC

## 2020-08-12 NOTE — Telephone Encounter (Signed)
I spoke with the pt's spouse and notified of response per Dr Vaughan Browner  She verbalized understanding  Rx for pred was sent and I have ordered CT and scheduled sooner ov and PFT

## 2020-08-12 NOTE — Telephone Encounter (Signed)
We will need to evaluate if the COVID-19 infection has made his interstitial lung disease worse Please order high-res CT and PFTs and follow-up in clinic sooner than scheduled at next available  Order prednisone 20 mg a day until he can be seen in clinic

## 2020-08-12 NOTE — Telephone Encounter (Signed)
Spoke with the pt's spouse [name]  She states that the pt is still having cough since had covid end of Sept 2021  He is not coughing up anything and the cough is worse at night and keeps him awake  He is not having any SOB at this time, but does have some wheezing  He is not having any sore throat, f/c/s, aches  We had called in pred 06/21/20 and this helped minimally  His PCP prescribed zpack 2-3 wks ago and this did not help  He has stopped the mucinex that he had been on and has tried otc cough syrups without relief  Next ov here with PFT 10/12/20  Please advise, thanks!

## 2020-08-12 NOTE — Telephone Encounter (Signed)
ATC pt. Line rang several times without VM. WCB.

## 2020-08-12 NOTE — Patient Instructions (Addendum)
Visit Information  Phone number for Pharmacist: [phone]  Thank you for meeting with me to discuss your medications! I look forward to working with you to achieve your health care goals. Below is a summary of what we talked about during the visit:  Goals Addressed            This Visit's Progress   . Pharmacy Care Plan       CARE PLAN ENTRY (see longitudinal plan of care for additional care plan information)  Current Barriers:  . Chronic Disease Management support, education, and care coordination needs related to Hypertension, Hyperlipidemia, Diabetes, and Atrial Fibrillation   Hypertension / Atrial fibrillation BP Readings from Last 3 Encounters:  05/23/20 131/81  05/07/20 138/80  04/19/20 126/70   Pulse Readings from Last 3 Encounters:  05/23/20 90  05/07/20 81  04/19/20 77 .  Pharmacist Clinical Goal(s): o Over the next 90 days, patient will work with PharmD and providers to maintain BP goal <130/80 and HR < 110 . Current regimen:  o Metoprolol tartrate 50 mg BID o Xarelto 20 mg daily . Interventions: o Discussed BP goals and benefits of medications for prevention of heart attack / stroke . Patient self care activities - Over the next 90 days, patient will: o Check BP 1-2 times weekly, document, and provide at future appointments o Ensure daily salt intake < 2300 mg/day  Hyperlipidemia / CAD Lab Results  Component Value Date/Time   LDLCALC 55 11/05/2019 08:29 AM .  Pharmacist Clinical Goal(s): o Over the next 90 days, patient will work with PharmD and providers to maintain LDL goal < 70 . Current regimen:  o Simvastatin 40 mg daily o OTC omega-3 fatty acids  . Interventions: o Discussed cholesterol goals and benefits of medications for prevention of heart attack / stroke . Patient self care activities - Over the next 90 days, patient will: o Continue current medications  Diabetes Lab Results  Component Value Date/Time   HGBA1C 8.7 (H) 05/07/2020 10:22  AM   HGBA1C 8.3 (H) 11/05/2019 08:29 AM .  Pharmacist Clinical Goal(s): o Over the next 90 days, patient will work with PharmD and providers to achieve A1c goal <7% . Current regimen:  o Metformin ER 500 mg - 2 tab twice a day o Glipizide 5 mg twice a day . Interventions: o Discussed importance of maintaining sugars at goal to prevent complications of diabetes including kidney damage, retinal damage, and cardiovascular disease o Recommend to pursue patient assistance for Rybelsus. Once approved, switch glipizide to Rybelsus . Patient self care activities - Over the next 30 days, patient will: o Check blood sugar in the morning before eating or drinking, document, and provide at future appointments o Contact provider with any episodes of hypoglycemia  Medication management . Pharmacist Clinical Goal(s): o Over the next 90 days, patient will work with PharmD and providers to maintain optimal medication adherence . Current pharmacy: [facility] . Interventions o Comprehensive medication review performed. o Continue current medication management strategy . Patient self care activities - Over the next 90 days, patient will: o Focus on medication adherence by pill box o Take medications as prescribed o Report any questions or concerns to PharmD and/or provider(s)  Initial goal documentation      Todd [name] was given information about Chronic Care Management services today including:  1. CCM service includes personalized support from designated clinical staff supervised by his physician, including individualized plan of care and coordination with other care providers 2.  24/7 contact phone numbers for assistance for urgent and routine care needs. 3. Standard insurance, coinsurance, copays and deductibles apply for chronic care management only during months in which we provide at least 20 minutes of these services. Most insurances cover these services at 100%, however patients may be  responsible for any copay, coinsurance and/or deductible if applicable. This service may help you avoid the need for more expensive face-to-face services. 4. Only one practitioner may furnish and bill the service in a calendar month. 5. The patient may stop CCM services at any time (effective at the end of the month) by phone call to the office staff.  Patient agreed to services and verbal consent obtained.   The patient verbalized understanding of instructions, educational materials, and care plan provided today and agreed to receive a mailed copy of patient instructions, educational materials, and care plan.  Telephone follow up appointment with pharmacy team member scheduled for: 3 months  Charlene Brooke, PharmD, BCACP Clinical Pharmacist [facility] at [facility] [phone]  Carbohydrate Counting for Diabetes Mellitus, Adult  Carbohydrate counting is a method of keeping track of how many carbohydrates you eat. Eating carbohydrates naturally increases the amount of sugar (glucose) in the blood. Counting how many carbohydrates you eat helps keep your blood glucose within normal limits, which helps you manage your diabetes (diabetes mellitus). It is important to know how many carbohydrates you can safely have in each meal. This is different for every person. A diet and nutrition specialist (registered dietitian) can help you make a meal plan and calculate how many carbohydrates you should have at each meal and snack. Carbohydrates are found in the following foods:  Grains, such as breads and cereals.  Dried beans and soy products.  Starchy vegetables, such as potatoes, peas, and corn.  Fruit and fruit juices.  Milk and yogurt.  Sweets and snack foods, such as cake, cookies, candy, chips, and soft drinks. How do I count carbohydrates? There are two ways to count carbohydrates in food. You can use either of the methods or a combination of both. Reading "Nutrition  Facts" on packaged food The "Nutrition Facts" list is included on the labels of almost all packaged foods and beverages in the [place] It includes:  The serving size.  Information about nutrients in each serving, including the grams (g) of carbohydrate per serving. To use the "Nutrition Facts":  Decide how many servings you will have.  Multiply the number of servings by the number of carbohydrates per serving.  The resulting number is the total amount of carbohydrates that you will be having. Learning standard serving sizes of other foods When you eat carbohydrate foods that are not packaged or do not include "Nutrition Facts" on the label, you need to measure the servings in order to count the amount of carbohydrates:  Measure the foods that you will eat with a food scale or measuring cup, if needed.  Decide how many standard-size servings you will eat.  Multiply the number of servings by 15. Most carbohydrate-rich foods have about 15 g of carbohydrates per serving. ? For example, if you eat 8 oz (170 g) of strawberries, you will have eaten 2 servings and 30 g of carbohydrates (2 servings x 15 g = 30 g).  For foods that have more than one food mixed, such as soups and casseroles, you must count the carbohydrates in each food that is included. The following list contains standard serving sizes of common carbohydrate-rich foods. Each of  these servings has about 15 g of carbohydrates:   hamburger bun or  English muffin.   oz (15 mL) syrup.   oz (14 g) jelly.  1 slice of bread.  1 six-inch tortilla.  3 oz (85 g) cooked rice or pasta.  4 oz (113 g) cooked dried beans.  4 oz (113 g) starchy vegetable, such as peas, corn, or potatoes.  4 oz (113 g) hot cereal.  4 oz (113 g) mashed potatoes or  of a large baked potato.  4 oz (113 g) canned or frozen fruit.  4 oz (120 mL) fruit juice.  4-6 crackers.  6 chicken nuggets.  6 oz (170 g) unsweetened dry cereal.  6 oz (170  g) plain fat-free yogurt or yogurt sweetened with artificial sweeteners.  8 oz (240 mL) milk.  8 oz (170 g) fresh fruit or one small piece of fruit.  24 oz (680 g) popped popcorn. Example of carbohydrate counting Sample meal  3 oz (85 g) chicken breast.  6 oz (170 g) brown rice.  4 oz (113 g) corn.  8 oz (240 mL) milk.  8 oz (170 g) strawberries with sugar-free whipped topping. Carbohydrate calculation 1. Identify the foods that contain carbohydrates: ? Rice. ? Corn. ? Milk. ? Strawberries. 2. Calculate how many servings you have of each food: ? 2 servings rice. ? 1 serving corn. ? 1 serving milk. ? 1 serving strawberries. 3. Multiply each number of servings by 15 g: ? 2 servings rice x 15 g = 30 g. ? 1 serving corn x 15 g = 15 g. ? 1 serving milk x 15 g = 15 g. ? 1 serving strawberries x 15 g = 15 g. 4. Add together all of the amounts to find the total grams of carbohydrates eaten: ? 30 g + 15 g + 15 g + 15 g = 75 g of carbohydrates total. Summary  Carbohydrate counting is a method of keeping track of how many carbohydrates you eat.  Eating carbohydrates naturally increases the amount of sugar (glucose) in the blood.  Counting how many carbohydrates you eat helps keep your blood glucose within normal limits, which helps you manage your diabetes.  A diet and nutrition specialist (registered dietitian) can help you make a meal plan and calculate how many carbohydrates you should have at each meal and snack. This information is not intended to replace advice given to you by your health care provider. Make sure you discuss any questions you have with your health care provider. Document Revised: 03/01/2017 Document Reviewed: 01/19/2016 Elsevier Patient Education  [place].

## 2020-08-21 NOTE — Progress Notes (Signed)
Cardiology Office Note   Date:  08/23/2020   ID:  Todd DoffingJames [name], DOB 10/21/1940, MRN [id number]  PCP:  Pincus SanesBurns, Stacy J, MD  Cardiologist:   Rollene RotundaJames Hatsumi Steinhart, MD   Chief Complaint  Patient presents with  . Coronary Artery Disease      History of Present Illness: Todd DoffingJames R Todd [name] is a 80 y.o. male who presents for follow up of CAD and atrial fibrillation.  In March of 2015 he had a stress perfusion study demonstrating no evidence of ischemia or infarct with preserved ejection fraction.  He was seen preop in June prior to carpal tunnel release.    Since I last saw him he did not have Covid despite getting fully vaccinated.  He actually got the immunoglobulin.  He has had chronic persistent nonproductive cough.  He has not been particularly short of breath.  I did review pulmonary notes and they are planning a high resolution CT and pulmonary function testing.  He has not had any cardiovascular symptoms.  He still does his yard work.  He is not cutting wood anymore.  He denies cardiovascular symptoms such as chest pressure, neck or arm discomfort.  Has had no new shortness of breath, PND or orthopnea.  Has had no new palpitations, presyncope or syncope.   Past Medical History:  Diagnosis Date  . Allergic rhinitis   . Asthma   . Atrial fibrillation with rapid ventricular response (HCC)    a. newly diagnosed 11/03/13, spont conv to NSR, placed on xarelto.  Marland Kitchen. CAD (coronary artery disease)    a. 1999; s/p CABG: LIMA to LAD, SVG to OM1, SVG to OM2, SVG to RCA  b. Normal nuc 10/2013 (done because of new onset AF.)  . Diabetes mellitus (HCC)   . HLD (hyperlipidemia)   . HTN (hypertension)   . Nephrolithiasis   . Overweight(278.02)   . PVD (peripheral vascular disease) (HCC)    Carotid stenosis  . Stroke [facility](HCC)     Past Surgical History:  Procedure Laterality Date  . CAROTID ENDARTERECTOMY  1999  . COLONOSCOPY  2014   negative;Dr Jarold MottoPatterson  . COLONOSCOPY W/ POLYPECTOMY  2009   Dr  Jarold MottoPatterson  . CORONARY ARTERY BYPASS GRAFT  Aug.1999  . HEMORRHOID BANDING    . INTRACAPSULAR CATARACT EXTRACTION Bilateral 2018  . NASAL SINUS SURGERY       Current Outpatient Medications  Medication Sig Dispense Refill  . albuterol (VENTOLIN HFA) 108 (90 Base) MCG/ACT inhaler Inhale 2 puffs into the lungs every 6 (six) hours as needed for wheezing or shortness of breath. 8 g 0  . cholecalciferol (VITAMIN D3) 25 MCG (1000 UNIT) tablet Take 1,000 Units by mouth daily.    . fexofenadine (ALLEGRA) 180 MG tablet Take 180 mg by mouth daily.    . fluticasone (FLONASE) 50 MCG/ACT nasal spray Place 2 sprays into both nostrils daily. 16 g 6  . glipiZIDE (GLUCOTROL) 5 MG tablet Take 1 tablet (5 mg total) by mouth 2 (two) times daily before a meal. 60 tablet 3  . glucose blood (ONE TOUCH ULTRA TEST) test strip Test blood sugar once daily 100 each 1  . metFORMIN (GLUCOPHAGE-XR) 500 MG 24 hr tablet TAKE 2 TABLETS BY MOUTH TWICE A DAY 180 tablet 1  . metoprolol tartrate (LOPRESSOR) 50 MG tablet TAKE 1 TABLET BY MOUTH 2 TIMES DAILY. 180 tablet 2  . nitroGLYCERIN (NITROSTAT) 0.4 MG SL tablet Place 1 tablet (0.4 mg total) under the tongue every 5 (five)  minutes as needed for chest pain. 100 tablet 3  . Omega-3 Fatty Acids (FISH OIL PO) Take 1 tablet by mouth daily.    Marland Kitchen omeprazole (PRILOSEC) 20 MG capsule TAKE 1 CAPSULE BY MOUTH DAILY. 90 capsule 2  . ONETOUCH DELICA LANCETS 33G MISC TEST BLOOD SUGAR ONCE DAILY 100 each 3  . predniSONE (DELTASONE) 20 MG tablet Take 1 tablet (20 mg total) by mouth daily with breakfast. 30 tablet 0  . simvastatin (ZOCOR) 40 MG tablet TAKE 1 TABLET BY MOUTH EVERY EVENING. 90 tablet 2  . triamcinolone cream (KENALOG) 0.1 %   3  . XARELTO 20 MG TABS tablet TAKE 1 TABLET BY MOUTH DAILY WITH SUPPER. 90 tablet 0   No current facility-administered medications for this visit.    Allergies:   Amlodipine besylate and Ivp dye [iodinated diagnostic agents]    ROS:  Please see the  history of present illness.   Otherwise, review of systems are positive for none.   All other systems are reviewed and negative.    PHYSICAL EXAM: VS:  BP (!) 148/80   Pulse 71   Ht 5\' 8"  (1.727 m)   Wt 185 lb 9.6 oz (84.2 kg)   BMI 28.22 kg/m  , BMI Body mass index is 28.22 kg/m. GENERAL:  Well appearing NECK:  No jugular venous distention, waveform within normal limits, carotid upstroke brisk and symmetric, no bruits, no thyromegaly LUNGS:  Clear to auscultation bilaterally CHEST:  Diffuse fine crackles HEART:  PMI not displaced or sustained,S1 and S2 within normal limits, no S3, no S4, no clicks, no rubs, no murmurs ABD:  Flat, positive bowel sounds normal in frequency in pitch, no bruits, no rebound, no guarding, no midline pulsatile mass, no hepatomegaly, no splenomegaly, umbilical hernia EXT:  2 plus pulses throughout, no edema, no cyanosis no clubbing     EKG:  EKG is  ordered today. The ekg ordered today demonstrates sinus rhythm, rate 71, axis within normal limits, intervals within normal limits, no acute ST-T wave changes.   Recent Labs: 11/05/2019: TSH 4.35 05/07/2020: ALT 24; BUN 18; Creat 0.93; Hemoglobin 13.2; Platelets 172; Potassium 4.6; Sodium 142    Lipid Panel    Component Value Date/Time   CHOL 115 11/05/2019 0829   TRIG 131.0 11/05/2019 0829   TRIG 94 07/19/2006 0727   HDL 33.00 (L) 11/05/2019 0829   CHOLHDL 3 11/05/2019 0829   VLDL 26.2 11/05/2019 0829   LDLCALC 55 11/05/2019 0829      Wt Readings from Last 3 Encounters:  08/23/20 185 lb 9.6 oz (84.2 kg)  05/07/20 174 lb 6.4 oz (79.1 kg)  04/19/20 196 lb 9.6 oz (89.2 kg)      Other studies Reviewed: Additional studies/ records that were reviewed today include: Pulmonary notes. Review of the above records demonstrates:  Please see elsewhere in the note.     ASSESSMENT AND PLAN:  PAROXYSMAL ATRIAL FIB - The patient tolerates anticoagulation. Mr. Jaylin Blane Fitzwater has a CHA2DS2 - VASc score of  6.   He has not had any symptomatic tachypalpitations.  He tolerates anticoagulation.  No change in therapy.  CAD -  The patient has no new sypmtoms.  No further cardiovascular testing is indicated.  We will continue with aggressive risk reduction and meds as listed.  CAROTID ARTERY DISEASE -  He had mild plaque in  in Jan 2019.  No further imaging.   HYPERLIPIDEMIA -  LDL was 55 with an HDL of 33.  He will continue on the meds as listed.   OVERWEIGHT/OBESITY -   He did lose weight with Covid.  I encourage more continued gradual weight loss.   HYPERTENSION - He reports his blood pressure is very mildly elevated but this is unusual.  No change in therapy.   DM A1c is up to 8.7 which was up from 6.7.  However, he has been on steroids with pulmonary complaints.   COUGH He has a dry nonproductive cough.  He does have scheduled high-resolution CT and pulmonary function testing and I will defer to his pulmonary doctors.  Current medicines are reviewed at length with the patient today.  The patient does not have concerns regarding medicines.  The following changes have been made:  None  Labs/ tests ordered today include:   None  Orders Placed This Encounter  Procedures  . EKG 12-Lead     Disposition:   FU with 12 months.     Signed, Rollene Rotunda, MD  08/23/2020 12:02 PM    [facility]

## 2020-08-23 ENCOUNTER — Other Ambulatory Visit: Payer: Self-pay

## 2020-08-23 ENCOUNTER — Ambulatory Visit: Payer: HMO | Admitting: Cardiology

## 2020-08-23 ENCOUNTER — Encounter: Payer: Self-pay | Admitting: Cardiology

## 2020-08-23 VITALS — BP 148/80 | HR 71 | Ht 68.0 in | Wt 185.6 lb

## 2020-08-23 DIAGNOSIS — E785 Hyperlipidemia, unspecified: Secondary | ICD-10-CM

## 2020-08-23 DIAGNOSIS — I10 Essential (primary) hypertension: Secondary | ICD-10-CM | POA: Diagnosis not present

## 2020-08-23 DIAGNOSIS — I2510 Atherosclerotic heart disease of native coronary artery without angina pectoris: Secondary | ICD-10-CM | POA: Diagnosis not present

## 2020-08-23 DIAGNOSIS — I480 Paroxysmal atrial fibrillation: Secondary | ICD-10-CM | POA: Diagnosis not present

## 2020-08-23 NOTE — Patient Instructions (Signed)
Medication Instructions:  No changes *If you need a refill on your cardiac medications before your next appointment, please call your pharmacy*  Lab Work: None ordered this visit  Testing/Procedures: None ordered this visit  Follow-Up: At [facility], you and your health needs are our priority.  As part of our continuing mission to provide you with exceptional heart care, we have created designated Provider Care Teams.  These Care Teams include your primary Cardiologist (physician) and Advanced Practice Providers (APPs -  Physician Assistants and Nurse Practitioners) who all work together to provide you with the care you need, when you need it.  Your next appointment:   12 month(s)  You will receive a reminder letter in the mail two months in advance. If you don't receive a letter, please call our office to schedule the follow-up appointment.  The format for your next appointment:   In Person  Provider:   Raymont Hochrein, MD  

## 2020-08-24 ENCOUNTER — Telehealth: Payer: Self-pay | Admitting: Cardiology

## 2020-08-24 NOTE — Telephone Encounter (Signed)
*  STAT* If patient is at the pharmacy, call can be transferred to refill team.   1. Which medications need to be refilled? (please list name of each medication and dose if known) XARELTO 20 MG TABS tablet metoprolol tartrate (LOPRESSOR) 50 MG tablet simvastatin (ZOCOR) 40 MG tablet 2. Which pharmacy/location (including street and city if local pharmacy) is medication to be sent to? [facility] - [place], [place] - [address]  3. Do they need a 30 day or 90 day supply? 90 day   Pt is out of Xarelto

## 2020-08-25 ENCOUNTER — Telehealth: Payer: Self-pay

## 2020-08-25 MED ORDER — RIVAROXABAN 20 MG PO TABS
20.0000 mg | ORAL_TABLET | Freq: Every day | ORAL | 1 refills | Status: DC
Start: 2020-08-25 — End: 2021-02-24

## 2020-08-25 NOTE — Telephone Encounter (Signed)
Prescription refill request for Xarelto received.  Indication:atrial fibrillation Last office visit:1/22  hochrein Weight:84.2 kg Age:80 Scr:0.93 9/21 CrCl:79.22 ml/min  Prescription refilled

## 2020-08-27 ENCOUNTER — Other Ambulatory Visit: Payer: Self-pay

## 2020-08-31 ENCOUNTER — Other Ambulatory Visit: Payer: Self-pay

## 2020-08-31 ENCOUNTER — Other Ambulatory Visit: Payer: Self-pay | Admitting: *Deleted

## 2020-08-31 ENCOUNTER — Ambulatory Visit (INDEPENDENT_AMBULATORY_CARE_PROVIDER_SITE_OTHER): Payer: HMO | Admitting: Pulmonary Disease

## 2020-08-31 DIAGNOSIS — J849 Interstitial pulmonary disease, unspecified: Secondary | ICD-10-CM

## 2020-08-31 LAB — PULMONARY FUNCTION TEST
DL/VA % pred: 109 %
DL/VA: 4.33 ml/min/mmHg/L
DLCO cor % pred: 88 %
DLCO cor: 20.59 ml/min/mmHg
DLCO unc % pred: 88 %
DLCO unc: 20.59 ml/min/mmHg
FEF 25-75 Post: 3.5 L/sec
FEF 25-75 Pre: 3.15 L/sec
FEF2575-%Change-Post: 11 %
FEF2575-%Pred-Post: 180 %
FEF2575-%Pred-Pre: 162 %
FEV1-%Change-Post: 1 %
FEV1-%Pred-Post: 82 %
FEV1-%Pred-Pre: 81 %
FEV1-Post: 2.25 L
FEV1-Pre: 2.22 L
FEV1FVC-%Change-Post: 3 %
FEV1FVC-%Pred-Pre: 119 %
FEV6-%Change-Post: -1 %
FEV6-%Pred-Post: 70 %
FEV6-%Pred-Pre: 72 %
FEV6-Post: 2.52 L
FEV6-Pre: 2.57 L
FEV6FVC-%Pred-Post: 107 %
FEV6FVC-%Pred-Pre: 107 %
FVC-%Change-Post: -1 %
FVC-%Pred-Post: 65 %
FVC-%Pred-Pre: 67 %
FVC-Post: 2.52 L
FVC-Pre: 2.57 L
Post FEV1/FVC ratio: 89 %
Post FEV6/FVC ratio: 100 %
Pre FEV1/FVC ratio: 87 %
Pre FEV6/FVC Ratio: 100 %
RV % pred: 61 %
RV: 1.54 L
TLC % pred: 69 %
TLC: 4.64 L

## 2020-08-31 NOTE — Progress Notes (Signed)
Full PFT performed today. °

## 2020-09-02 ENCOUNTER — Ambulatory Visit
Admission: RE | Admit: 2020-09-02 | Discharge: 2020-09-02 | Disposition: A | Discharge status: Home / Self Care | Payer: HMO | Source: Ambulatory Visit | Attending: Pulmonary Disease | Admitting: Pulmonary Disease

## 2020-09-02 ENCOUNTER — Other Ambulatory Visit: Payer: Self-pay | Admitting: Internal Medicine

## 2020-09-02 DIAGNOSIS — J849 Interstitial pulmonary disease, unspecified: Secondary | ICD-10-CM

## 2020-09-02 DIAGNOSIS — I2510 Atherosclerotic heart disease of native coronary artery without angina pectoris: Secondary | ICD-10-CM | POA: Diagnosis not present

## 2020-09-02 DIAGNOSIS — I700 Atherosclerosis of aorta: Secondary | ICD-10-CM | POA: Diagnosis not present

## 2020-09-02 DIAGNOSIS — J479 Bronchiectasis, uncomplicated: Secondary | ICD-10-CM | POA: Diagnosis not present

## 2020-09-07 ENCOUNTER — Ambulatory Visit: Payer: HMO | Admitting: Pulmonary Disease

## 2020-09-07 ENCOUNTER — Other Ambulatory Visit: Payer: Self-pay

## 2020-09-07 ENCOUNTER — Ambulatory Visit (INDEPENDENT_AMBULATORY_CARE_PROVIDER_SITE_OTHER): Payer: HMO | Admitting: Pulmonary Disease

## 2020-09-07 ENCOUNTER — Encounter: Payer: Self-pay | Admitting: Pulmonary Disease

## 2020-09-07 VITALS — BP 142/66 | HR 73 | Temp 98.3°F | Ht 68.0 in | Wt 187.0 lb

## 2020-09-07 DIAGNOSIS — J8410 Pulmonary fibrosis, unspecified: Secondary | ICD-10-CM | POA: Diagnosis not present

## 2020-09-07 DIAGNOSIS — J849 Interstitial pulmonary disease, unspecified: Secondary | ICD-10-CM | POA: Diagnosis not present

## 2020-09-07 MED ORDER — PREDNISONE 10 MG PO TABS: ORAL_TABLET | ORAL | 0 refills | Status: DC

## 2020-09-07 NOTE — Progress Notes (Signed)
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 is a 80 year old with history of allergies, rhinitis, atrial fibrillation, hypertension, hyperlipidemia, diabetes. He had a confirmed episode of influenza in February and was sent for 2 weeks. He has a lingering nonproductive cough associated with occasional wheezing. He denies any dyspnea, sputum production, fevers, chills. He had been given a prednisone taper last month without any improvement in symptoms. 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  He was sick for about a week.  Treated with outpatient monoclonal antibody therapy and did not require hospitalization  His cough has worsened since his COVID-19 infection.  We will put him on prednisone at 20 mg in December with improvement Continues to have mild GERD symptoms, on Prilosec once a day  He is here for review of CT and PFTs.   Outpatient Encounter Medications as of 09/07/2020  Medication Sig  . albuterol (VENTOLIN HFA) 108 (90 Base) MCG/ACT inhaler Inhale 2 puffs into the lungs every 6 (six) hours as needed for wheezing or shortness of breath.  . cholecalciferol (VITAMIN D3) 25 MCG (1000 UNIT) tablet Take 1,000 Units by mouth daily.  . fexofenadine (ALLEGRA) 180 MG tablet Take 180 mg by mouth daily.  . fluticasone (FLONASE) 50 MCG/ACT nasal spray Place 2 sprays into both nostrils daily.  Marland Kitchen glipiZIDE (GLUCOTROL) 5 MG tablet Take 1 tablet (5 mg total) by mouth 2  (two) times daily before a meal.  . glucose blood (ONE TOUCH ULTRA TEST) test strip Test blood sugar once daily  . metFORMIN (GLUCOPHAGE-XR) 500 MG 24 hr tablet TAKE 2 TABLETS BY MOUTH TWICE A DAY  . metoprolol tartrate (LOPRESSOR) 50 MG tablet TAKE 1 TABLET BY MOUTH 2 TIMES DAILY.  . nitroGLYCERIN (NITROSTAT) 0.4 MG SL tablet Place 1 tablet (0.4 mg total) under the tongue every 5 (five) minutes as needed for chest pain.  . Omega-3 Fatty Acids (FISH OIL PO) Take 1 tablet by mouth daily.  Marland Kitchen omeprazole (PRILOSEC) 20 MG capsule TAKE 1 CAPSULE BY MOUTH DAILY.  Marland Kitchen ONETOUCH DELICA LANCETS 51W MISC TEST BLOOD SUGAR ONCE DAILY  . predniSONE (DELTASONE) 20 MG tablet Take 1 tablet (20 mg total) by mouth daily with breakfast.  . rivaroxaban (XARELTO) 20 MG TABS tablet Take 1 tablet (20 mg total) by mouth daily with supper.  . simvastatin (ZOCOR) 40 MG tablet TAKE 1 TABLET BY MOUTH EVERY EVENING.  Marland Kitchen triamcinolone cream (KENALOG) 0.1 %    No facility-administered encounter medications on file as of 09/07/2020.   Physical Exam: Blood pressure (!) 142/66, pulse 73, temperature 98.3 F (36.8 C), temperature source Skin, height 5\' 8"  (1.727 m), weight 187 lb (84.8 kg), SpO2 97 %. Gen:      No acute distress HEENT:  EOMI, sclera anicteric Neck:     No masses; no thyromegaly Lungs:    Bibasal crackles CV:         Regular rate and rhythm; no murmurs Abd:      +  bowel sounds; soft, non-tender; no palpable masses, no distension Ext:    No edema; adequate peripheral perfusion Skin:      Warm and dry; no rash Neuro: alert and oriented x 3 Psych: normal mood and affect  Data Reviewed: Imaging Chest x-ray 12/04/16-stable interstitial opacities CT abdomen 11/20/13-mild reticular opacities at the bases CT high-resolution 01/02/17- subpleural reticulation, mild traction bronchiectasis, possible early honeycombing with basal gradient. 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  Slightly worse compared to  2018 High-res CT 03/21/2019-probable UIP High-res CT 03/30/2020-unchanged probable UIP fibrosis High-res CT 09/02/2020- unchanged probable UIP fibrosis I have reviewed the images personally  PFTs  03/20/17- FVC 2.74 (72%], FEV1 2.38 (87%), F/F 87, TLC 62%, DLCO 15.89 (56%)  01/15/2018-FVC 2.74 [73%], FEV1 2.33 [86%], F/F 85, TLC 71%, DLCO 13.13 (46%)  07/23/2018-FVC 2.51 [69%), FEV1 2.10 [78%), DLCO 15.16 (53%) Mild restriction, moderate-severe diffusion defect.  04/02/2019 FVC 2.70 [72%], FEV1 2.18 [82%], F/F 81, DLCO 13.8 [60%] Moderate diffusion defect  08/31/2020 FVC 2.52 [65%], FEV1 2.25 [82%], F/F 89, TLC 4.64 [69%], DLCO 20.59 [88%] Mild restriction.  Improvement in diffusion capacity  FENO 12/18/16- 12  Labs ILD serologies 01/09/17- all negative except for mild elevation in SCL 75.  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 There is a possibility that this could be idiopathic pulmonary fibrosis but most CT scans and PFTs show stability.  We had several discussions with Mr. Gilreath and his wife regarding anti-fibrotic therapy.  Given stability of pulmonary fibrosis and lack of symptoms we have decided to hold off treatment.  They are wary of possible side effects.  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  He had mild symptoms which did not require hospitalization Follow-up CT and PFTs reviewed which thankfully did not show any worsening of fibrosis or lung capacity.  Actually his diffusion capacity has improved compared to prior tests  Chronic cough Has responded to prednisone.  Start tapering prednisone by 5 mg every week Continue Prilosec for GERD.  I have asked him to increase Prilosec dose to 20 mg twice daily for couple of weeks.  Health maintenance 01/15/2015-Prevnar 13 05/05/2010-Pneumovax  Plan/Recommendations: - Taper prednisone by 5 mg  every week - Prilosec dose to 20 mg twice daily for 2 weeks - Follow-up in 6 months.  Marshell Garfinkel MD [facility] 09/07/2020, 10:14 AM  CC: Binnie Rail, MD

## 2020-09-07 NOTE — Patient Instructions (Addendum)
I am glad you are doing well and the cough is improved Start tapering prednisone to 15 mg for a week, then 10 mg for a week then 5 mg for a week and stop thereafter I have reviewed the CT scan and PFTs which show stable lung function  Follow-up in 6 months.

## 2020-10-08 ENCOUNTER — Telehealth: Payer: Self-pay | Admitting: Pharmacist

## 2020-10-11 NOTE — Progress Notes (Signed)
Chronic Care Management Pharmacy Assistant   Name: Todd [name]  MRN: [id number] DOB: 03-27-1941  Reason for Encounter: Diabetic Adherence Call   PCP : Binnie Rail, MD  Allergies:   Allergies  Allergen Reactions  . Amlodipine Besylate     Rash Because of a history of documented adverse serious drug reaction;Medi Alert bracelet  is recommended  . Ivp Dye [Iodinated Diagnostic Agents]     Rash Because of a history of documented adverse serious drug reaction;Medi Alert bracelet  is recommended    Medications: Outpatient Encounter Medications as of 10/08/2020  Medication Sig  . albuterol (VENTOLIN HFA) 108 (90 Base) MCG/ACT inhaler Inhale 2 puffs into the lungs every 6 (six) hours as needed for wheezing or shortness of breath.  . cholecalciferol (VITAMIN D3) 25 MCG (1000 UNIT) tablet Take 1,000 Units by mouth daily.  . fexofenadine (ALLEGRA) 180 MG tablet Take 180 mg by mouth daily.  . fluticasone (FLONASE) 50 MCG/ACT nasal spray Place 2 sprays into both nostrils daily.  Marland Kitchen glipiZIDE (GLUCOTROL) 5 MG tablet Take 1 tablet (5 mg total) by mouth 2 (two) times daily before a meal.  . glucose blood (ONE TOUCH ULTRA TEST) test strip Test blood sugar once daily  . metFORMIN (GLUCOPHAGE-XR) 500 MG 24 hr tablet TAKE 2 TABLETS BY MOUTH TWICE A DAY  . metoprolol tartrate (LOPRESSOR) 50 MG tablet TAKE 1 TABLET BY MOUTH 2 TIMES DAILY.  . nitroGLYCERIN (NITROSTAT) 0.4 MG SL tablet Place 1 tablet (0.4 mg total) under the tongue every 5 (five) minutes as needed for chest pain.  . Omega-3 Fatty Acids (FISH OIL PO) Take 1 tablet by mouth daily.  Marland Kitchen omeprazole (PRILOSEC) 20 MG capsule TAKE 1 CAPSULE BY MOUTH DAILY.  Marland Kitchen ONETOUCH DELICA LANCETS 57W MISC TEST BLOOD SUGAR ONCE DAILY  . predniSONE (DELTASONE) 10 MG tablet 15mg x's1 week,10mg x's1week,5mg x's 1 week, stop  . predniSONE (DELTASONE) 20 MG tablet Take 1 tablet (20 mg total) by mouth daily with breakfast.  . rivaroxaban (XARELTO) 20 MG TABS  tablet Take 1 tablet (20 mg total) by mouth daily with supper.  . simvastatin (ZOCOR) 40 MG tablet TAKE 1 TABLET BY MOUTH EVERY EVENING.  Marland Kitchen triamcinolone cream (KENALOG) 0.1 %    No facility-administered encounter medications on file as of 10/08/2020.    Current Diagnosis: Patient Active Problem List   Diagnosis Date Noted  . Sinus drainage 07/28/2020  . Aortic atherosclerosis ([place]) 05/07/2020  . Educated about COVID-19 virus infection 08/07/2019  . GERD (gastroesophageal reflux disease) 05/06/2019  . Pulmonary fibrosis ([place]) 05/05/2019  . Asbestosis ([place]) 05/05/2019  . Ventral hernia without obstruction or gangrene 10/31/2018  . Umbilical hernia without obstruction or gangrene 10/31/2018  . Eczema 10/29/2017  . Hives 08/13/2017  . Cough 12/04/2016  . 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 Hemorrhoids, internal, with bleeding 01/15/2015  . Bursitis of left shoulder 01/14/2015  . Type 2 diabetes, controlled, with peripheral circulatory disorder ([place]) 12/05/2013  . PVD (peripheral vascular disease) ([place]) 11/12/2013  . Atrial fibrillation ([place]) 11/03/2013  . Carotid artery disease ([place]) 12/20/2009  . OVERWEIGHT/OBESITY 11/17/2008  . Hyperlipidemia 07/20/2008  . Essential hypertension 07/20/2008  . Coronary atherosclerosis 07/20/2008  . ALLERGIC RHINITIS 07/20/2008  . NEPHROLITHIASIS, HX OF 07/20/2008    Goals Addressed   None     Follow-Up:  Pharmacist Review    Recent Relevant Labs: Lab Results  Component Value Date/Time   HGBA1C 8.7 (H) 05/07/2020 10:22 AM   HGBA1C 8.3 (H) 11/05/2019  08:29 AM   MICROALBUR 1.9 05/07/2020 10:24 AM   MICROALBUR 1.2 05/06/2019 08:53 AM    Kidney Function Lab Results  Component Value Date/Time   CREATININE 0.93 05/07/2020 10:22 AM   CREATININE 1.04 11/05/2019 08:29 AM   CREATININE 1.03 05/06/2019 08:53 AM   CREATININE 1.23 (H) 08/03/2015 09:03 AM   GFR 69.29 11/05/2019 08:29 AM   GFRNONAA 79 05/07/2020 10:22 AM   GFRAA 91 05/07/2020  10:22 AM    . Current antihyperglycemic regimen: The patient takes Metformin and Glipizide for his diabetes  . What recent interventions/DTPs have been made to improve glycemic control: The patient is to exercise and watch his diet  . Have there been any recent hospitalizations or ED visits since last visit with CPP? The patient has not been to the hospital or ED  . Patient denies hypoglycemic symptoms . Patient wife reports that blood sugar has been over 200 at times hyperglycemic symptoms, light headed when getting up  . How often are you checking your blood sugar?  The patient wife states that he checks blood sugar daily  . What are your blood sugars ranging? The patient wife states that his blood sugars range between 158-203 o Fasting: This morning 180 o Before meals: NA o After meals: NA o Bedtime: NA . During the week, how often does your blood glucose drop below 70? The patient wife states that he has not had any readings below 69  . Are you checking your feet daily/regularly? The patient wife states that the patient has some swelling in legs and ankles    Adherence Review: Is the patient currently on a STATIN medication? Yes, simvastatin Is the patient currently on ACE/ARB medication? No Does the patient have >5 day gap between last estimated fill dates? No   Wendy Poet, Clinical Pharmacist Assistant [facility] [phone]    Time spent:20

## 2020-10-12 ENCOUNTER — Ambulatory Visit: Payer: HMO | Admitting: Pulmonary Disease

## 2020-10-26 ENCOUNTER — Telehealth: Payer: Self-pay | Admitting: Pharmacist

## 2020-10-26 NOTE — Progress Notes (Addendum)
° ° °  Chronic Care Management Pharmacy Assistant   Name: Todd [name]  MRN: [id number] DOB: 12-03-1940   Reason for Encounter: PAP    Medications: Outpatient Encounter Medications as of 10/26/2020  Medication Sig   albuterol (VENTOLIN HFA) 108 (90 Base) MCG/ACT inhaler Inhale 2 puffs into the lungs every 6 (six) hours as needed for wheezing or shortness of breath.   cholecalciferol (VITAMIN D3) 25 MCG (1000 UNIT) tablet Take 1,000 Units by mouth daily.   fexofenadine (ALLEGRA) 180 MG tablet Take 180 mg by mouth daily.   fluticasone (FLONASE) 50 MCG/ACT nasal spray Place 2 sprays into both nostrils daily.   glipiZIDE (GLUCOTROL) 5 MG tablet Take 1 tablet (5 mg total) by mouth 2 (two) times daily before a meal.   glucose blood (ONE TOUCH ULTRA TEST) test strip Test blood sugar once daily   metFORMIN (GLUCOPHAGE-XR) 500 MG 24 hr tablet TAKE 2 TABLETS BY MOUTH TWICE A DAY   metoprolol tartrate (LOPRESSOR) 50 MG tablet TAKE 1 TABLET BY MOUTH 2 TIMES DAILY.   nitroGLYCERIN (NITROSTAT) 0.4 MG SL tablet Place 1 tablet (0.4 mg total) under the tongue every 5 (five) minutes as needed for chest pain.   Omega-3 Fatty Acids (FISH OIL PO) Take 1 tablet by mouth daily.   omeprazole (PRILOSEC) 20 MG capsule TAKE 1 CAPSULE BY MOUTH DAILY.   ONETOUCH DELICA LANCETS 14E MISC TEST BLOOD SUGAR ONCE DAILY   predniSONE (DELTASONE) 10 MG tablet 15mg x's1 week,10mg x's1week,5mg x's 1 week, stop   predniSONE (DELTASONE) 20 MG tablet Take 1 tablet (20 mg total) by mouth daily with breakfast.   rivaroxaban (XARELTO) 20 MG TABS tablet Take 1 tablet (20 mg total) by mouth daily with supper.   simvastatin (ZOCOR) 40 MG tablet TAKE 1 TABLET BY MOUTH EVERY EVENING.   triamcinolone cream (KENALOG) 0.1 %    No facility-administered encounter medications on file as of 10/26/2020.   A call was made to [place] to check on the status of [name] patient assistance application for [employer]. I spoke with the representative who  said that only page 1 of the application was received and there was no proof of income, application needs to be re-faxed.  [place] [phone]

## 2020-11-04 ENCOUNTER — Other Ambulatory Visit: Payer: Self-pay | Admitting: Internal Medicine

## 2020-11-05 NOTE — Telephone Encounter (Signed)
Re-Faxed application to [employer] on 11/04/20. Will follow up in 3-5 business days.

## 2020-11-07 NOTE — Progress Notes (Signed)
Subjective:    Patient ID: Todd [name], male    DOB: 09-03-1940, 80 y.o.   MRN: [id number]  HPI He is here for a physical exam.   He has been on prednisone for his lungs.  This has elevated sugars.   Sugars prior to prednisone 150-160.  With the prednisone it was in the 200's - highest 236.    He is not exercising.    BP controlled at home   Medications and allergies reviewed with patient and updated if appropriate.  Patient Active Problem List   Diagnosis Date Noted  . Sinus drainage 07/28/2020  . Aortic atherosclerosis ([place]) 05/07/2020  . Educated about COVID-19 virus infection 08/07/2019  . GERD (gastroesophageal reflux disease) 05/06/2019  . Pulmonary fibrosis ([place]) 05/05/2019  . Asbestosis ([place]) 05/05/2019  . Ventral hernia without obstruction or gangrene 10/31/2018  . Umbilical hernia without obstruction or gangrene 10/31/2018  . Eczema 10/29/2017  . Hives 08/13/2017  . Cough 12/04/2016  . 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 Hemorrhoids, internal, with bleeding 01/15/2015  . Bursitis of left shoulder 01/14/2015  . Type 2 diabetes, controlled, with peripheral circulatory disorder ([place]) 12/05/2013  . PVD (peripheral vascular disease) ([place]) 11/12/2013  . Atrial fibrillation ([place]) 11/03/2013  . Carotid artery disease ([place]) 12/20/2009  . OVERWEIGHT/OBESITY 11/17/2008  . Hyperlipidemia 07/20/2008  . Essential hypertension 07/20/2008  . Coronary atherosclerosis 07/20/2008  . ALLERGIC RHINITIS 07/20/2008  . NEPHROLITHIASIS, HX OF 07/20/2008    Current Outpatient Medications on File Prior to Visit  Medication Sig Dispense Refill  . albuterol (VENTOLIN HFA) 108 (90 Base) MCG/ACT inhaler Inhale 2 puffs into the lungs every 6 (six) hours as needed for wheezing or shortness of breath. 8 g 0  . cholecalciferol (VITAMIN D3) 25 MCG (1000 UNIT) tablet Take 1,000 Units by mouth daily.    . fexofenadine (ALLEGRA) 180 MG tablet Take 180 mg by mouth daily.    . fluticasone  (FLONASE) 50 MCG/ACT nasal spray Place 2 sprays into both nostrils daily. 16 g 6  . glipiZIDE (GLUCOTROL) 5 MG tablet TAKE 1 TABLET BY MOUTH 2 TIMES DAILY BEFORE A MEAL. 60 tablet 3  . glucose blood (ONE TOUCH ULTRA TEST) test strip Test blood sugar once daily 100 each 1  . metFORMIN (GLUCOPHAGE-XR) 500 MG 24 hr tablet TAKE 2 TABLETS BY MOUTH TWICE A DAY 180 tablet 1  . metoprolol tartrate (LOPRESSOR) 50 MG tablet TAKE 1 TABLET BY MOUTH 2 TIMES DAILY. 180 tablet 2  . nitroGLYCERIN (NITROSTAT) 0.4 MG SL tablet Place 1 tablet (0.4 mg total) under the tongue every 5 (five) minutes as needed for chest pain. 100 tablet 3  . Omega-3 Fatty Acids (FISH OIL PO) Take 1 tablet by mouth daily.    Marland Kitchen omeprazole (PRILOSEC) 20 MG capsule TAKE 1 CAPSULE BY MOUTH DAILY. 90 capsule 2  . ONETOUCH DELICA LANCETS 99991111 MISC TEST BLOOD SUGAR ONCE DAILY 100 each 3  . rivaroxaban (XARELTO) 20 MG TABS tablet Take 1 tablet (20 mg total) by mouth daily with supper. 90 tablet 1  . simvastatin (ZOCOR) 40 MG tablet TAKE 1 TABLET BY MOUTH EVERY EVENING. 90 tablet 2  . triamcinolone cream (KENALOG) 0.1 %   3   No current facility-administered medications on file prior to visit.    Past Medical History:  Diagnosis Date  . Allergic rhinitis   . Asthma   . Atrial fibrillation with rapid ventricular response ([place])    a. newly diagnosed 11/03/13, spont conv  to NSR, placed on xarelto.  Marland Kitchen CAD (coronary artery disease)    a. 1999; s/p CABG: LIMA to LAD, SVG to OM1, SVG to OM2, SVG to RCA  b. Normal nuc 10/2013 (done because of new onset AF.)  . Diabetes mellitus ([place])   . HLD (hyperlipidemia)   . HTN (hypertension)   . Nephrolithiasis   . Overweight(278.02)   . PVD (peripheral vascular disease) ([facility])    Carotid stenosis  . Stroke [facility])     Past Surgical History:  Procedure Laterality Date  . CAROTID ENDARTERECTOMY  1999  . COLONOSCOPY  2014   negative;Dr Sharlett Iles  . COLONOSCOPY W/ POLYPECTOMY  2009   Dr Sharlett Iles  .  CORONARY ARTERY BYPASS GRAFT  Aug.1999  . HEMORRHOID BANDING    . INTRACAPSULAR CATARACT EXTRACTION Bilateral 2018  . NASAL SINUS SURGERY      Social History   Socioeconomic History  . Marital status: Married    Spouse name: [name]  . Number of children: 2  . Years of education: Not on file  . Highest education level: Not on file  Occupational History  . Occupation: Sales Manager-retired  Tobacco Use  . Smoking status: Never Smoker  . Smokeless tobacco: Never Used  Vaping Use  . Vaping Use: Never used  Substance and Sexual Activity  . Alcohol use: No    Alcohol/week: 0.0 standard drinks  . Drug use: No  . Sexual activity: Yes  Other Topics Concern  . Not on file  Social History Narrative  . Not on file   Social Determinants of Health   Financial Resource Strain: Low Risk   . Difficulty of Paying Living Expenses: Not hard at all  Food Insecurity: No Food Insecurity  . Worried About Charity fundraiser in the Last Year: Never true  . Ran Out of Food in the Last Year: Never true  Transportation Needs: No Transportation Needs  . Lack of Transportation (Medical): No  . Lack of Transportation (Non-Medical): No  Physical Activity: Sufficiently Active  . Days of Exercise per Week: 5 days  . Minutes of Exercise per Session: 30 min  Stress: No Stress Concern Present  . Feeling of Stress : Not at all  Social Connections: Socially Integrated  . Frequency of Communication with Friends and Family: More than three times a week  . Frequency of Social Gatherings with Friends and Family: Once a week  . Attends Religious Services: More than 4 times per year  . Active Member of Clubs or Organizations: No  . Attends Archivist Meetings: More than 4 times per year  . Marital Status: Married    Family History  Problem Relation Age of Onset  . Prostate cancer Brother   . Hemochromatosis Brother   . Colon cancer Brother 4  . Heart attack Brother 62  . Asthma Sister   .  Heart attack Mother 88  . Heart attack Brother 64  . Diabetes Father        IDDM  . Stroke Father 66    Review of Systems  Constitutional: Negative for chills and fever.  Eyes: Negative for visual disturbance.  Respiratory: Negative for cough, shortness of breath and wheezing.   Cardiovascular: Positive for leg swelling (LLE - mild). Negative for chest pain and palpitations.  Gastrointestinal: Negative for abdominal pain, blood in stool, constipation, diarrhea and nausea.       Gerd sometimes  Genitourinary: Positive for frequency (at night - empties bladder). Negative for dysuria  and hematuria.  Musculoskeletal: Positive for arthralgias.  Skin: Negative for rash.  Neurological: Negative for light-headedness and headaches.  Psychiatric/Behavioral: Negative for dysphoric mood. The patient is not nervous/anxious.        Objective:   Vitals:   11/08/20 0906 11/08/20 1005  BP: (!) 150/82 (!) 144/84  Pulse: 78   Temp: 98.4 F (36.9 C)   SpO2: 98%    Filed Weights   11/08/20 0906  Weight: 190 lb (86.2 kg)   Body mass index is 28.89 kg/m.  BP Readings from Last 3 Encounters:  11/08/20 (!) 144/84  09/07/20 (!) 142/66  08/23/20 (!) 148/80    Wt Readings from Last 3 Encounters:  11/08/20 190 lb (86.2 kg)  09/07/20 187 lb (84.8 kg)  08/23/20 185 lb 9.6 oz (84.2 kg)     Physical Exam Constitutional: He appears well-developed and well-nourished. No distress.  HENT:  Head: Normocephalic and atraumatic.  Right Ear: External ear normal.  Left Ear: External ear normal.  Mouth/Throat: Oropharynx is clear and moist.  Normal ear canals and TM b/l  Eyes: Conjunctivae and EOM are normal.  Neck: Neck supple. No tracheal deviation present. No thyromegaly present.  No carotid bruit  Cardiovascular: Normal rate, regular rhythm, normal heart sounds and intact distal pulses.   No murmur heard. Pulmonary/Chest: Effort normal. No respiratory distress. He has no wheezes.  Bibasilar  dry crackles.  Abdominal: Soft.  Ventral hernia.  He exhibits no distension. There is no tenderness.  Genitourinary: deferred  Musculoskeletal: He exhibits no edema.  Lymphadenopathy:   He has no cervical adenopathy.  Skin: Skin is warm and dry. He is not diaphoretic.  Psychiatric: He has a normal mood and affect. His behavior is normal.         Assessment & Plan:   Physical exam: Screening blood work  ordered Immunizations  had covid booster, others up to date Colonoscopy   n/a Eye exams   Up to date  Exercise   none Weight   Encouraged weight loss Substance abuse   none  See Problem List for Assessment and Plan of chronic medical problems.   This visit occurred during the SARS-CoV-2 public health emergency.  Safety protocols were in place, including screening questions prior to the visit, additional usage of staff PPE, and extensive cleaning of exam room while observing appropriate contact time as indicated for disinfecting solutions.

## 2020-11-07 NOTE — Patient Instructions (Addendum)
Blood work was ordered.     Medications changes include :   none    Please followup in 3 months    Health Maintenance, Male Adopting a healthy lifestyle and getting preventive care are important in promoting health and wellness. Ask your health care provider about:  The right schedule for you to have regular tests and exams.  Things you can do on your own to prevent diseases and keep yourself healthy. What should I know about diet, weight, and exercise? Eat a healthy diet  Eat a diet that includes plenty of vegetables, fruits, low-fat dairy products, and lean protein.  Do not eat a lot of foods that are high in solid fats, added sugars, or sodium.   Maintain a healthy weight Body mass index (BMI) is a measurement that can be used to identify possible weight problems. It estimates body fat based on height and weight. Your health care provider can help determine your BMI and help you achieve or maintain a healthy weight. Get regular exercise Get regular exercise. This is one of the most important things you can do for your health. Most adults should:  Exercise for at least 150 minutes each week. The exercise should increase your heart rate and make you sweat (moderate-intensity exercise).  Do strengthening exercises at least twice a week. This is in addition to the moderate-intensity exercise.  Spend less time sitting. Even light physical activity can be beneficial. Watch cholesterol and blood lipids Have your blood tested for lipids and cholesterol at 78 years of age, then have this test every 5 years. You may need to have your cholesterol levels checked more often if:  Your lipid or cholesterol levels are high.  You are older than 78 years of age.  You are at high risk for heart disease. What should I know about cancer screening? Many types of cancers can be detected early and may often be prevented. Depending on your health history and family history, you may need to  have cancer screening at various ages. This may include screening for:  Colorectal cancer.  Prostate cancer.  Skin cancer.  Lung cancer. What should I know about heart disease, diabetes, and high blood pressure? Blood pressure and heart disease  High blood pressure causes heart disease and increases the risk of stroke. This is more likely to develop in people who have high blood pressure readings, are of African descent, or are overweight.  Talk with your health care provider about your target blood pressure readings.  Have your blood pressure checked: ? Every 3-5 years if you are 70-96 years of age. ? Every year if you are 44 years old or older.  If you are between the ages of 38 and 48 and are a current or former smoker, ask your health care provider if you should have a one-time screening for abdominal aortic aneurysm (AAA). Diabetes Have regular diabetes screenings. This checks your fasting blood sugar level. Have the screening done:  Once every three years after age 35 if you are at a normal weight and have a low risk for diabetes.  More often and at a younger age if you are overweight or have a high risk for diabetes. What should I know about preventing infection? Hepatitis B If you have a higher risk for hepatitis B, you should be screened for this virus. Talk with your health care provider to find out if you are at risk for hepatitis B infection. Hepatitis C Blood testing is recommended for:  Everyone born from [birth year] through 1963.  Anyone with known risk factors for hepatitis C. Sexually transmitted infections (STIs)  You should be screened each year for STIs, including gonorrhea and chlamydia, if: ? You are sexually active and are younger than 78 years of age. ? You are older than 78 years of age and your health care provider tells you that you are at risk for this type of infection. ? Your sexual activity has changed since you were last screened, and you are at  increased risk for chlamydia or gonorrhea. Ask your health care provider if you are at risk.  Ask your health care provider about whether you are at high risk for HIV. Your health care provider may recommend a prescription medicine to help prevent HIV infection. If you choose to take medicine to prevent HIV, you should first get tested for HIV. You should then be tested every 3 months for as long as you are taking the medicine. Follow these instructions at home: Lifestyle  Do not use any products that contain nicotine or tobacco, such as cigarettes, e-cigarettes, and chewing tobacco. If you need help quitting, ask your health care provider.  Do not use street drugs.  Do not share needles.  Ask your health care provider for help if you need support or information about quitting drugs. Alcohol use  Do not drink alcohol if your health care provider tells you not to drink.  If you drink alcohol: ? Limit how much you have to 0-2 drinks a day. ? Be aware of how much alcohol is in your drink. In the [place] one drink equals one 12 oz bottle of beer (355 mL), one 5 oz glass of wine (148 mL), or one 1 oz glass of hard liquor (44 mL). General instructions  Schedule regular health, dental, and eye exams.  Stay current with your vaccines.  Tell your health care provider if: ? You often feel depressed. ? You have ever been abused or do not feel safe at home. Summary  Adopting a healthy lifestyle and getting preventive care are important in promoting health and wellness.  Follow your health care provider's instructions about healthy diet, exercising, and getting tested or screened for diseases.  Follow your health care provider's instructions on monitoring your cholesterol and blood pressure. This information is not intended to replace advice given to you by your health care provider. Make sure you discuss any questions you have with your health care provider. Document Revised: 07/31/2018  Document Reviewed: 07/31/2018 Elsevier Patient Education  2021 [employer].

## 2020-11-08 ENCOUNTER — Other Ambulatory Visit: Payer: Self-pay

## 2020-11-08 ENCOUNTER — Ambulatory Visit (INDEPENDENT_AMBULATORY_CARE_PROVIDER_SITE_OTHER): Payer: HMO | Admitting: Internal Medicine

## 2020-11-08 ENCOUNTER — Encounter: Payer: Self-pay | Admitting: Internal Medicine

## 2020-11-08 VITALS — BP 144/84 | HR 78 | Temp 98.4°F | Ht 68.0 in | Wt 190.0 lb

## 2020-11-08 DIAGNOSIS — I2510 Atherosclerotic heart disease of native coronary artery without angina pectoris: Secondary | ICD-10-CM | POA: Diagnosis not present

## 2020-11-08 DIAGNOSIS — Z0000 Encounter for general adult medical examination without abnormal findings: Secondary | ICD-10-CM | POA: Diagnosis not present

## 2020-11-08 DIAGNOSIS — J8410 Pulmonary fibrosis, unspecified: Secondary | ICD-10-CM | POA: Diagnosis not present

## 2020-11-08 DIAGNOSIS — E7849 Other hyperlipidemia: Secondary | ICD-10-CM

## 2020-11-08 DIAGNOSIS — K219 Gastro-esophageal reflux disease without esophagitis: Secondary | ICD-10-CM | POA: Diagnosis not present

## 2020-11-08 DIAGNOSIS — I4891 Unspecified atrial fibrillation: Secondary | ICD-10-CM | POA: Diagnosis not present

## 2020-11-08 DIAGNOSIS — E1151 Type 2 diabetes mellitus with diabetic peripheral angiopathy without gangrene: Secondary | ICD-10-CM | POA: Diagnosis not present

## 2020-11-08 DIAGNOSIS — I700 Atherosclerosis of aorta: Secondary | ICD-10-CM

## 2020-11-08 DIAGNOSIS — I10 Essential (primary) hypertension: Secondary | ICD-10-CM | POA: Diagnosis not present

## 2020-11-08 LAB — LIPID PANEL
Cholesterol: 132 mg/dL (ref 0–200)
HDL: 37.1 mg/dL — ABNORMAL LOW (ref >39.00)
LDL Cholesterol: 65 mg/dL (ref 0–99)
NonHDL: 94.75
Total CHOL/HDL Ratio: 4
Triglycerides: 150 mg/dL — ABNORMAL HIGH (ref 0.0–149.0)
VLDL: 30 mg/dL (ref 0.0–40.0)

## 2020-11-08 LAB — CBC WITH DIFFERENTIAL/PLATELET
Basophils Absolute: 0 10*3/uL (ref 0.0–0.1)
Basophils Relative: 0.4 % (ref 0.0–3.0)
Eosinophils Absolute: 0.1 10*3/uL (ref 0.0–0.7)
Eosinophils Relative: 1.7 % (ref 0.0–5.0)
HCT: 39.8 % (ref 39.0–52.0)
Hemoglobin: 13.8 g/dL (ref 13.0–17.0)
Lymphocytes Relative: 33.9 % (ref 12.0–46.0)
Lymphs Abs: 2.1 10*3/uL (ref 0.7–4.0)
MCHC: 34.8 g/dL (ref 30.0–36.0)
MCV: 99 fl (ref 78.0–100.0)
Monocytes Absolute: 0.5 10*3/uL (ref 0.1–1.0)
Monocytes Relative: 8.1 % (ref 3.0–12.0)
Neutro Abs: 3.5 10*3/uL (ref 1.4–7.7)
Neutrophils Relative %: 55.9 % (ref 43.0–77.0)
Platelets: 167 10*3/uL (ref 150.0–400.0)
RBC: 4.02 Mil/uL — ABNORMAL LOW (ref 4.22–5.81)
RDW: 14 % (ref 11.5–15.5)
WBC: 6.3 10*3/uL (ref 4.0–10.5)

## 2020-11-08 LAB — COMPREHENSIVE METABOLIC PANEL
ALT: 14 U/L (ref 0–53)
AST: 20 U/L (ref 0–37)
Albumin: 4.1 g/dL (ref 3.5–5.2)
Alkaline Phosphatase: 66 U/L (ref 39–117)
BUN: 16 mg/dL (ref 6–23)
CO2: 26 mEq/L (ref 19–32)
Calcium: 9.3 mg/dL (ref 8.4–10.5)
Chloride: 104 mEq/L (ref 96–112)
Creatinine, Ser: 0.83 mg/dL (ref 0.40–1.50)
GFR: 84.26 mL/min (ref >60.00)
Glucose, Bld: 165 mg/dL — ABNORMAL HIGH (ref 70–99)
Potassium: 4.3 mEq/L (ref 3.5–5.1)
Sodium: 139 mEq/L (ref 135–145)
Total Bilirubin: 0.7 mg/dL (ref 0.2–1.2)
Total Protein: 7.3 g/dL (ref 6.0–8.3)

## 2020-11-08 LAB — TSH: TSH: 4.03 u[IU]/mL (ref 0.35–4.50)

## 2020-11-08 LAB — HEMOGLOBIN A1C: Hgb A1c MFr Bld: 10.1 % — ABNORMAL HIGH (ref 4.6–6.5)

## 2020-11-08 NOTE — Assessment & Plan Note (Signed)
Chronic No symptoms suggestive of angina Following with cardiology Continue current medications

## 2020-11-08 NOTE — Assessment & Plan Note (Signed)
Chronic Sugars have not been well controlled He is not exercising, but eats fairly well Check A1c Continue Metformin 1000 mg twice daily, glipizide 5 mg twice daily Pharmacist is trying to get patient assistance for [employer] Stressed regular exercise, weight loss and diabetic diet

## 2020-11-08 NOTE — Assessment & Plan Note (Signed)
Chronic Asymptomatic Continue Xarelto and metoprolol CBC, TSH, CMP

## 2020-11-08 NOTE — Assessment & Plan Note (Signed)
Chronic GERD controlled Continue omeprazole 20 mg daily  

## 2020-11-08 NOTE — Assessment & Plan Note (Signed)
Chronic LDL has been at goal Continue simvastatin 40 mg daily Stressed healthy diet, regular exercise

## 2020-11-08 NOTE — Assessment & Plan Note (Signed)
Chronic Check lipid panel, cmp  Continue simvastatin 40 mg daily Regular exercise and healthy diet encouraged

## 2020-11-08 NOTE — Assessment & Plan Note (Signed)
Chronic Blood pressure well controlled at home, but elevated here Advised that he bring his blood pressure cuff to his next doctor's appointment to make sure it is accurate We will hold off on changes since BP is well controlled at home Continue metoprolol 50 mg twice daily

## 2020-11-08 NOTE — Assessment & Plan Note (Signed)
Chronic Following with pulmonary 

## 2020-11-09 DIAGNOSIS — H18413 Arcus senilis, bilateral: Secondary | ICD-10-CM | POA: Diagnosis not present

## 2020-11-09 DIAGNOSIS — H26491 Other secondary cataract, right eye: Secondary | ICD-10-CM | POA: Diagnosis not present

## 2020-11-09 DIAGNOSIS — G245 Blepharospasm: Secondary | ICD-10-CM | POA: Diagnosis not present

## 2020-11-09 DIAGNOSIS — Z961 Presence of intraocular lens: Secondary | ICD-10-CM | POA: Diagnosis not present

## 2020-11-10 ENCOUNTER — Telehealth: Payer: Self-pay | Admitting: Pharmacist

## 2020-11-10 NOTE — Progress Notes (Signed)
A call was made to [facility] to check on the status of patient's patient assistance form for [employer]. Spoke with representative Engineer, mining) who stated that the patient application was still missing SS# and proof of income.  Wendy Poet, [place] [phone]   Time spent:60 minutes, Telephone call to [facility], reviewing chart notes and documentation

## 2020-11-11 NOTE — Telephone Encounter (Signed)
Obtained social security number from patient's wife and re-faxed [employer] application to [employer]. Will await response.

## 2020-11-24 ENCOUNTER — Telehealth: Payer: HMO

## 2020-11-24 NOTE — Progress Notes (Deleted)
Chronic Care Management Pharmacy Note  11/24/2020 Name:  Todd [name] MRN:  [id number] DOB:  [birth year]  Subjective: Todd [name] is an 80 y.o. year old male who is a primary patient of Burns, [name], MD.  The CCM team was consulted for assistance with disease management and care coordination needs.    Engaged with patient by telephone for follow up visit in response to provider referral for pharmacy case management and/or care coordination services.   Consent to Services:  The patient was given information about Chronic Care Management services, agreed to services, and gave verbal consent prior to initiation of services.  Please see initial visit note for detailed documentation.   Patient Care Team: Binnie Rail, MD as PCP - General (Internal Medicine) Minus Breeding, MD as PCP - Cardiology (Cardiology) Gatha Mayer, MD as Consulting Physician (Gastroenterology) Minus Breeding, MD as Consulting Physician (Cardiology) Vevelyn Royals, MD as Consulting Physician (Ophthalmology) Darleen Crocker, MD as Consulting Physician (Ophthalmology) Charlton Haws, [facility] as Pharmacist (Pharmacist)  Recent office visits: 11/08/20 Dr Quay Burow OV: CPE. A1c up to 10.1. Need Rybelsus approved via PAP.  Recent consult visits: [facility] visits: None in previous 6 months  Objective:  Lab Results  Component Value Date   CREATININE 0.83 11/08/2020   BUN 16 11/08/2020   GFR 84.26 11/08/2020   GFRNONAA 79 05/07/2020   GFRAA 91 05/07/2020   NA 139 11/08/2020   K 4.3 11/08/2020   CALCIUM 9.3 11/08/2020   CO2 26 11/08/2020   GLUCOSE 165 (H) 11/08/2020    Lab Results  Component Value Date/Time   HGBA1C 10.1 (H) 11/08/2020 10:11 AM   HGBA1C 8.7 (H) 05/07/2020 10:22 AM   GFR 84.26 11/08/2020 10:11 AM   GFR 69.29 11/05/2019 08:29 AM   MICROALBUR 1.9 05/07/2020 10:24 AM   MICROALBUR 1.2 05/06/2019 08:53 AM    Last diabetic Eye exam:  Lab Results  Component Value Date/Time    HMDIABEYEEXA No Retinopathy 11/30/2016 12:00 AM   HMDIABEYEEXA No Retinopathy 11/30/2016 12:00 AM   HMDIABEYEEXA No Retinopathy 11/30/2016 12:00 AM    Last diabetic Foot exam: No results found for: HMDIABFOOTEX   Lab Results  Component Value Date   CHOL 132 11/08/2020   HDL 37.10 (L) 11/08/2020   LDLCALC 65 11/08/2020   TRIG 150.0 (H) 11/08/2020   CHOLHDL 4 11/08/2020    Hepatic Function Latest Ref Rng & Units 11/08/2020 05/07/2020 11/05/2019  Total Protein 6.0 - 8.3 g/dL 7.3 7.4 7.5  Albumin 3.5 - 5.2 g/dL 4.1 - 4.0  AST 0 - 37 U/L _0 ALT 0 - 53 U/L _1 Alk Phosphatase 39 - 117 U/L 66 - 70  Total Bilirubin 0.2 - 1.2 mg/dL 0.7 0.5 0.7  Bilirubin, Direct <=0.2 mg/dL - - -    Lab Results  Component Value Date/Time   TSH 4.03 11/08/2020 10:11 AM   TSH 4.35 11/05/2019 08:29 AM   FREET4 0.92 10/29/2017 09:50 AM    CBC Latest Ref Rng & Units 11/08/2020 05/07/2020 11/05/2019  WBC 4.0 - 10.5 K/uL 6.3 6.2 7.6  Hemoglobin 13.0 - 17.0 g/dL 13.8 13.2 13.8  Hematocrit 39.0 - 52.0 % 39.8 38.9 39.7  Platelets 150.0 - 400.0 K/uL 167.0 172 167.0    No results found for: VD25OH  Clinical ASCVD: {YES/NO:21197} The 10-year ASCVD risk score Mikey Bussing DC Jr., et al., 2013) is: 58.9%   Values used to calculate the score:  Age: 65 years     Sex: Male     Is Non-Hispanic African American: No     Diabetic: Yes     Tobacco smoker: No     Systolic Blood Pressure: 573 mmHg     Is BP treated: Yes     HDL Cholesterol: 37.1 mg/dL     Total Cholesterol: 132 mg/dL    Depression screen [facility] 2/9 06/08/2020 06/07/2020 05/07/2020  Decreased Interest 0 (No Data) 0  Down, Depressed, Hopeless 0 - 0  PHQ - 2 Score 0 - 0  Altered sleeping - - -  Tired, decreased energy - - -  Change in appetite - - -  Feeling bad or failure about yourself  - - -  Trouble concentrating - - -  Moving slowly or fidgety/restless - - -  Suicidal thoughts - - -  PHQ-9 Score - - -  Difficult doing work/chores - -  -  Some recent data might be hidden     ***Other: (CHADS2VASc if Afib, MMRC or CAT for COPD, ACT, DEXA)  Social History   Tobacco Use  Smoking Status Never Smoker  Smokeless Tobacco Never Used   BP Readings from Last 3 Encounters:  11/08/20 (!) 144/84  09/07/20 (!) 142/66  08/23/20 (!) 148/80   Pulse Readings from Last 3 Encounters:  11/08/20 78  09/07/20 73  08/23/20 71   Wt Readings from Last 3 Encounters:  11/08/20 190 lb (86.2 kg)  09/07/20 187 lb (84.8 kg)  08/23/20 185 lb 9.6 oz (84.2 kg)   BMI Readings from Last 3 Encounters:  11/08/20 28.89 kg/m  09/07/20 28.43 kg/m  08/23/20 28.22 kg/m    Assessment/Interventions: Review of patient past medical history, allergies, medications, health status, including review of consultants reports, laboratory and other test data, was performed as part of comprehensive evaluation and provision of chronic care management services.   SDOH:  (Social Determinants of Health) assessments and interventions performed: {yes/no:20286}  SDOH Screenings   Alcohol Screen: Low Risk   . Last Alcohol Screening Score (AUDIT): 0  Depression (PHQ2-9): Low Risk   . PHQ-2 Score: 0  Financial Resource Strain: Low Risk   . Difficulty of Paying Living Expenses: Not hard at all  Food Insecurity: No Food Insecurity  . Worried About Charity fundraiser in the Last Year: Never true  . Ran Out of Food in the Last Year: Never true  Housing: Low Risk   . Last Housing Risk Score: 0  Physical Activity: Sufficiently Active  . Days of Exercise per Week: 5 days  . Minutes of Exercise per Session: 30 min  Social Connections: Socially Integrated  . Frequency of Communication with Friends and Family: More than three times a week  . Frequency of Social Gatherings with Friends and Family: Once a week  . Attends Religious Services: More than 4 times per year  . Active Member of Clubs or Organizations: No  . Attends Archivist Meetings: More than  4 times per year  . Marital Status: Married  Stress: No Stress Concern Present  . Feeling of Stress : Not at all  Tobacco Use: Low Risk   . Smoking Tobacco Use: Never Smoker  . Smokeless Tobacco Use: Never Used  Transportation Needs: No Transportation Needs  . Lack of Transportation (Medical): No  . Lack of Transportation (Non-Medical): No    CCM Care Plan  Allergies  Allergen Reactions  . Amlodipine Besylate     Rash Because of a  history of documented adverse serious drug reaction;Medi Alert bracelet  is recommended  . Ivp Dye [Iodinated Diagnostic Agents]     Rash Because of a history of documented adverse serious drug reaction;Medi Alert bracelet  is recommended    Medications Reviewed Today    Reviewed by Binnie Rail, MD (Physician) on 11/08/20 at [phone]  Med List Status: <None>  Medication Order Taking? Sig Documenting Provider Last Dose Status Informant  albuterol (VENTOLIN HFA) 108 (90 Base) MCG/ACT inhaler [id number]  Inhale 2 puffs into the lungs every 6 (six) hours as needed for wheezing or shortness of breath. Binnie Rail, MD  Active   cholecalciferol (VITAMIN D3) 25 MCG (1000 UNIT) tablet [id number]  Take 1,000 Units by mouth daily. [provider]  Active   fexofenadine (ALLEGRA) 180 MG tablet [id number]  Take 180 mg by mouth daily. [provider]  Active   fluticasone (FLONASE) 50 MCG/ACT nasal spray [id number]  Place 2 sprays into both nostrils daily. Binnie Rail, MD  Active   glipiZIDE (GLUCOTROL) 5 MG tablet [id number]  TAKE 1 TABLET BY MOUTH 2 TIMES DAILY BEFORE A MEAL. Binnie Rail, MD  Active   glucose blood (ONE TOUCH ULTRA TEST) test strip [id number]  Test blood sugar once daily Binnie Rail, MD  Active   metFORMIN (GLUCOPHAGE-XR) 500 MG 24 hr tablet [id number]  TAKE 2 TABLETS BY MOUTH TWICE A DAY Burns, [name], MD  Active   metoprolol tartrate (LOPRESSOR) 50 MG tablet [id number]  TAKE 1 TABLET BY MOUTH 2 TIMES DAILY. Minus Breeding, MD   Active   nitroGLYCERIN (NITROSTAT) 0.4 MG SL tablet [id number]  Place 1 tablet (0.4 mg total) under the tongue every 5 (five) minutes as needed for chest pain. Minus Breeding, MD  Active   Omega-3 Fatty Acids (FISH OIL PO) [id number]  Take 1 tablet by mouth daily. [provider]  Active Self  omeprazole (PRILOSEC) 20 MG capsule [id number]  TAKE 1 CAPSULE BY MOUTH DAILY. Minus Breeding, MD  Active   [facility] DELICA LANCETS 96Q [place] [id number]  TEST BLOOD SUGAR ONCE DAILY Binnie Rail, MD  Active   predniSONE (DELTASONE) 20 MG tablet [id number]  Take 1 tablet (20 mg total) by mouth daily with breakfast. Mannam, Praveen, MD  Active   rivaroxaban (XARELTO) 20 MG TABS tablet [id number]  Take 1 tablet (20 mg total) by mouth daily with supper. Minus Breeding, MD  Active   simvastatin (ZOCOR) 40 MG tablet [id number]  TAKE 1 TABLET BY MOUTH EVERY EVENING. Minus Breeding, MD  Active   triamcinolone cream (KENALOG) 0.1 % [id number]   [provider]  Active           Patient Active Problem List   Diagnosis Date Noted  . Sinus drainage 07/28/2020  . Aortic atherosclerosis ([place]) 05/07/2020  . Educated about COVID-19 virus infection 08/07/2019  . GERD (gastroesophageal reflux disease) 05/06/2019  . Pulmonary fibrosis ([place]) 05/05/2019  . Asbestosis ([place]) 05/05/2019  . Ventral hernia without obstruction or gangrene 10/31/2018  . Umbilical hernia without obstruction or gangrene 10/31/2018  . Eczema 10/29/2017  . Hives 08/13/2017  . Cough 12/04/2016  . 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 Hemorrhoids, internal, with bleeding 01/15/2015  . Bursitis of left shoulder 01/14/2015  . Type 2 diabetes, controlled, with peripheral circulatory disorder ([place]) 12/05/2013  . PVD (peripheral vascular disease) ([place]) 11/12/2013  . Atrial fibrillation ([place]) 11/03/2013  . Carotid artery disease ([place]) 12/20/2009  . OVERWEIGHT/OBESITY 11/17/2008  .  Hyperlipidemia 07/20/2008  . Essential hypertension  07/20/2008  . Coronary atherosclerosis 07/20/2008  . ALLERGIC RHINITIS 07/20/2008  . NEPHROLITHIASIS, HX OF 07/20/2008    Immunization History  Administered Date(s) Administered  . Fluad Quad(high Dose 65+) 05/30/2019  . Influenza Split 07/06/2011, 05/15/2012  . Influenza Whole 05/12/2010  . Influenza, High Dose Seasonal PF 05/30/2017, 05/02/2018, 06/12/2020  . Influenza,inj,Quad PF,6+ Mos 05/21/2013  . Influenza-Unspecified 05/20/2014, 06/11/2015, 05/24/2016  . PFIZER(Purple Top)SARS-COV-2 Vaccination 10/13/2019, 11/03/2019, 10/12/2020  . Pneumococcal Conjugate-13 01/15/2015  . Pneumococcal Polysaccharide-23 05/22/2005, 05/05/2010  . Tdap 12/03/2012  . Zoster 10/20/2010  . Zoster Recombinat (Shingrix) 12/19/2017, 03/04/2018    Conditions to be addressed/monitored:  {USCCMDZASSESSMENTOPTIONS:23563}  There are no care plans that you recently modified to display for this patient.    Medication Assistance: {MEDASSISTANCEINFO:25044}  Patient's preferred pharmacy is:  [facility], [place] [address] Phone: [phone] Fax: [phone]  Uses pill box? {Yes or If no, why not?:20788} Pt endorses ***% compliance  We discussed: {Pharmacy options:24294} Patient decided to: {US Pharmacy Plan:23885}  Care Plan and Follow Up Patient Decision:  {FOLLOWUP:24991}  Plan: {CM FOLLOW UP PLAN:25073}  ***

## 2020-11-24 NOTE — Telephone Encounter (Signed)
Received message from [facility] that Rybelsus is approved through 07/20/2020. Medication has been shipped to office and should arrive within 10-14 business days.

## 2020-12-06 ENCOUNTER — Other Ambulatory Visit: Payer: Self-pay | Admitting: Internal Medicine

## 2020-12-08 ENCOUNTER — Telehealth: Payer: Self-pay

## 2020-12-08 NOTE — Telephone Encounter (Signed)
Called and left message for patient today that his Rybelsus arrived today.  Medication has been left up front for him to pick up at his leisure.

## 2020-12-15 ENCOUNTER — Telehealth: Payer: Self-pay | Admitting: Pharmacist

## 2020-12-15 NOTE — Telephone Encounter (Signed)
Received call from patient's wife [name] asking if Rybelsus has come in yet. Informed her that we did receive Rybelsus 7 mg on 4/20 and it is ready for her in front office.  She reports pt has been on Rybelsus 3 mg (samples) for about 3 weeks and BG has improved remarkably. She reports he has even had some < 100.   To avoid hypoglycemia risk when Rybelsus is increased, advised patient to stop glipizide when he starts Rybelsus 7 mg. Continue to monitor BG daily.  Pt's wife voiced understanding.

## 2020-12-16 ENCOUNTER — Telehealth: Payer: Self-pay | Admitting: Internal Medicine

## 2020-12-16 NOTE — Telephone Encounter (Signed)
Patients wife calling, states something about the medicine you got for them and theres two different dosages and she doesn't know why. Wife unable to tell me name of medication.

## 2020-12-16 NOTE — Telephone Encounter (Signed)
Patient reports [facility] has sent 1 box of Rybelsus 3 mg and 2 boxes of Rybelsus 7 mg. This appears to be a mistake on [employer]' part as he has already started the 3 mg. Advised patient to skip to the 7 mg once he finishes 30 days of 3 mg. Reiterated for patient to stop glipizide once he starts Rybelsus 7 mg given recent BG ~100. Pt's wife voiced understanding.

## 2020-12-20 ENCOUNTER — Telehealth: Payer: HMO

## 2020-12-23 ENCOUNTER — Telehealth: Payer: Self-pay | Admitting: Pharmacist

## 2020-12-23 DIAGNOSIS — E1151 Type 2 diabetes mellitus with diabetic peripheral angiopathy without gangrene: Secondary | ICD-10-CM

## 2020-12-23 NOTE — Progress Notes (Addendum)
Chronic Care Management Pharmacy Assistant   Name: Todd [name]  MRN: [id number] DOB: Jan 25, 1941    Reason for Encounter:Diabetic Disease State Call   Conditions to be addressed/monitored: DMII   Recent office visits:  11/08/20 Dr. Quay Burow   Recent consult visits:  None ID  Hospital visits:  None in previous 6 months  Medications: Outpatient Encounter Medications as of 12/23/2020  Medication Sig   albuterol (VENTOLIN HFA) 108 (90 Base) MCG/ACT inhaler Inhale 2 puffs into the lungs every 6 (six) hours as needed for wheezing or shortness of breath.   cholecalciferol (VITAMIN D3) 25 MCG (1000 UNIT) tablet Take 1,000 Units by mouth daily.   fexofenadine (ALLEGRA) 180 MG tablet Take 180 mg by mouth daily.   fluticasone (FLONASE) 50 MCG/ACT nasal spray Place 2 sprays into both nostrils daily.   glipiZIDE (GLUCOTROL) 5 MG tablet TAKE 1 TABLET BY MOUTH 2 TIMES DAILY BEFORE A MEAL. (Patient taking differently: END DATE: 12/21/20 when Rybelsus increases to 7 mg.)   glucose blood (ONE TOUCH ULTRA TEST) test strip Test blood sugar once daily   metFORMIN (GLUCOPHAGE-XR) 500 MG 24 hr tablet TAKE 2 TABLETS BY MOUTH TWICE A DAY   metoprolol tartrate (LOPRESSOR) 50 MG tablet TAKE 1 TABLET BY MOUTH 2 TIMES DAILY.   nitroGLYCERIN (NITROSTAT) 0.4 MG SL tablet Place 1 tablet (0.4 mg total) under the tongue every 5 (five) minutes as needed for chest pain.   Omega-3 Fatty Acids (FISH OIL PO) Take 1 tablet by mouth daily.   omeprazole (PRILOSEC) 20 MG capsule TAKE 1 CAPSULE BY MOUTH DAILY.   ONETOUCH DELICA LANCETS 23N MISC TEST BLOOD SUGAR ONCE DAILY   rivaroxaban (XARELTO) 20 MG TABS tablet Take 1 tablet (20 mg total) by mouth daily with supper.   Semaglutide (RYBELSUS) 7 MG TABS Take 1 tablet by mouth daily.   simvastatin (ZOCOR) 40 MG tablet TAKE 1 TABLET BY MOUTH EVERY EVENING.   triamcinolone cream (KENALOG) 0.1 %    No facility-administered encounter medications on file as of 12/23/2020.       Recent Relevant Labs: Lab Results  Component Value Date/Time   HGBA1C 10.1 (H) 11/08/2020 10:11 AM   HGBA1C 8.7 (H) 05/07/2020 10:22 AM   MICROALBUR 1.9 05/07/2020 10:24 AM   MICROALBUR 1.2 05/06/2019 08:53 AM    Kidney Function Lab Results  Component Value Date/Time   CREATININE 0.83 11/08/2020 10:11 AM   CREATININE 0.93 05/07/2020 10:22 AM   CREATININE 1.04 11/05/2019 08:29 AM   CREATININE 1.23 (H) 08/03/2015 09:03 AM   GFR 84.26 11/08/2020 10:11 AM   GFRNONAA 79 05/07/2020 10:22 AM   GFRAA 91 05/07/2020 10:22 AM    Current antihyperglycemic regimen:  Metformin 1000 mg 2 tab twice daily, Rybelsus 7 mg daily Patient has stopped glipizide as instructed by pharmacist.  What recent interventions/DTPs have been made to improve glycemic control:  Stresses exercise, weight loss and diabetic diet per, Dr. Quay Burow Have there been any recent hospitalizations or ED visits since last visit with CPP? None ID  Patient denies any low readings and hypoglycemic symptoms  Patient denies any high readings and hyperglycemic symptoms  How often are you checking your blood sugar? Patient wife states that he checks blood sugar once a day  What are your blood sugars ranging? 97-134  Fasting:  Before meals: Blood sugar today was 102 After meals:  Bedtime:   During the week, how often does your blood glucose drop below 70? Patient has not readings below  70  Are you checking your feet daily/regularly? Wife states that patient has some swelling in ankles and that he says that his feet are numb  Adherence Review: Is the patient currently on a STATIN medication? Simvastatin Is the patient currently on ACE/ARB medication? No Does the patient have >5 day gap between last estimated fill dates? Yes  Star Rating Drugs: Metformin 12/06/20 90 ds Simvastatin 09/13/20 90 ds (has some on hand)  Todd [name] Clinical Pharmacist Assistant [phone]  Time spent:30

## 2020-12-24 MED ORDER — RYBELSUS 7 MG PO TABS: 1.0000 | ORAL_TABLET | Freq: Every day | ORAL | 3 refills | Status: DC

## 2020-12-24 NOTE — Telephone Encounter (Signed)
Patient has increased Rybelsus to 7 mg and stopped glipizide. Fasting blood sugars are much improved in 97-134 range.

## 2020-12-24 NOTE — Addendum Note (Signed)
Addended by: Charlton Haws on: 12/24/2020 10:18 AM   Modules accepted: Orders

## 2021-01-05 ENCOUNTER — Telehealth: Payer: Self-pay | Admitting: Internal Medicine

## 2021-01-05 DIAGNOSIS — E1151 Type 2 diabetes mellitus with diabetic peripheral angiopathy without gangrene: Secondary | ICD-10-CM

## 2021-01-05 NOTE — Telephone Encounter (Signed)
Message left for patient today to return call to clinic. If she calls back please see what specific questions so I can get them answered.

## 2021-01-05 NOTE — Telephone Encounter (Signed)
   Spouse calling with questions regarding Semaglutide (RYBELSUS) 7 MG TABS She states blood sugar has been higher than normal (178 this morning)

## 2021-01-06 NOTE — Telephone Encounter (Signed)
Patients wife called and said that the patients sugar has went up since taking the 7mg . She said that it was good when on the 3mg  and taking Glipizide 5mg . She said that her question was why would his sugars ben going up since being on the 7mg ? She said she can be reached at [phone]. Please advise

## 2021-01-06 NOTE — Telephone Encounter (Signed)
There because he stopped the glipizide.  We can increase in the Rybelsus to 14 mg and that should hopefully help lower his sugars.  They should let us know when he needs a refill.

## 2021-01-06 NOTE — Telephone Encounter (Signed)
Spoke with [name] and asked her to provide glucose readings:  5/4: 102 5/6: 170 5/7: 134 5/8: 162 5/9: 164 5:10: 130 5/11: 148 5/12: 148 5/13: 140 5/16: 170 5/17: 174 5/15: 179  Please advise.

## 2021-01-07 NOTE — Telephone Encounter (Signed)
With patient assistance does this automatically increase after a month from 7 mg to 14 mg?

## 2021-01-07 NOTE — Telephone Encounter (Signed)
Patient assistance will not automatically increase from 7 to 14 mg. I will send them a new Rx for the 14 mg so they can ship that. In the meantime he can take 2 of the 7 mg tablets a day. I believe he got 2 boxes of the 7 mg so he should have plenty.

## 2021-01-07 NOTE — Telephone Encounter (Signed)
Great thanks.   I let him know.

## 2021-01-10 MED ORDER — RYBELSUS 14 MG PO TABS: 14.0000 mg | ORAL_TABLET | Freq: Every day | ORAL | 0 refills | Status: DC

## 2021-01-10 NOTE — Addendum Note (Signed)
Addended by: Charlton Haws on: 01/10/2021 10:17 AM   Modules accepted: Orders

## 2021-01-24 ENCOUNTER — Telehealth: Payer: Self-pay | Admitting: Pharmacist

## 2021-01-24 NOTE — Telephone Encounter (Signed)
[facility] for shipment status. Via Automated response system, there is not a ship date for Rybelsus 14 mg. Faxed prescription again (3rd time).  Todd [name], please call [employer] and speak to a representative about shipping Rybelsus 14 mg ASAP. Also, please call [phone] to see if you can get a voucher for Rybelsus 14 mg since PAP shipment has been delayed.

## 2021-01-24 NOTE — Progress Notes (Addendum)
    Chronic Care Management Pharmacy Assistant   Name: Todd [name]  MRN: [id number] DOB: 11-Apr-1941    Medications: Outpatient Encounter Medications as of 01/24/2021  Medication Sig   albuterol (VENTOLIN HFA) 108 (90 Base) MCG/ACT inhaler Inhale 2 puffs into the lungs every 6 (six) hours as needed for wheezing or shortness of breath.   cholecalciferol (VITAMIN D3) 25 MCG (1000 UNIT) tablet Take 1,000 Units by mouth daily.   fexofenadine (ALLEGRA) 180 MG tablet Take 180 mg by mouth daily.   fluticasone (FLONASE) 50 MCG/ACT nasal spray Place 2 sprays into both nostrils daily.   glucose blood (ONE TOUCH ULTRA TEST) test strip Test blood sugar once daily   metFORMIN (GLUCOPHAGE-XR) 500 MG 24 hr tablet TAKE 2 TABLETS BY MOUTH TWICE A DAY   metoprolol tartrate (LOPRESSOR) 50 MG tablet TAKE 1 TABLET BY MOUTH 2 TIMES DAILY.   nitroGLYCERIN (NITROSTAT) 0.4 MG SL tablet Place 1 tablet (0.4 mg total) under the tongue every 5 (five) minutes as needed for chest pain.   Omega-3 Fatty Acids (FISH OIL PO) Take 1 tablet by mouth daily.   omeprazole (PRILOSEC) 20 MG capsule TAKE 1 CAPSULE BY MOUTH DAILY.   ONETOUCH DELICA LANCETS 99991111 MISC TEST BLOOD SUGAR ONCE DAILY   rivaroxaban (XARELTO) 20 MG TABS tablet Take 1 tablet (20 mg total) by mouth daily with supper.   Semaglutide (RYBELSUS) 14 MG TABS Take 14 mg by mouth daily. Via [employer] patient assistance   simvastatin (ZOCOR) 40 MG tablet TAKE 1 TABLET BY MOUTH EVERY EVENING.   triamcinolone cream (KENALOG) 0.1 %    No facility-administered encounter medications on file as of 01/24/2021.    Pharmacist Review  A call was made to [facility] to check on the status of the patient shipment for Rybelsus 14 mg. The Representative Judeen Hammans stated that the application was processed on 01/11/21, and it takes 7-14 business days for delivery. Delivery will include 4 boxes to be delivered to Dr. Quay Burow office. Also spoke with Representative Gregary Signs who states that they  have no emergency vouchers for the medication Rybelsus, patient will have to wait for delivery.   [place] Pharmacist Assistant [phone]

## 2021-01-24 NOTE — Telephone Encounter (Signed)
Patients wife called and was wondering if the medication has been order and if so if it was at the office to be picked up. She said that he will run out at the end of this week. She can be reached at [phone]. Please advise

## 2021-01-25 NOTE — Telephone Encounter (Signed)
1 box (#30) of Rybelsus 7 mg is available for patient to pick up to tide him over until 14 mg arrives.  Samples placed in front office cabinet for patient to pick up.

## 2021-02-07 ENCOUNTER — Telehealth: Payer: Self-pay

## 2021-02-07 ENCOUNTER — Ambulatory Visit: Payer: HMO | Admitting: Internal Medicine

## 2021-02-07 NOTE — Telephone Encounter (Signed)
Patient contacted regarding patient samples of Rybelsus being sent to the office today.

## 2021-02-08 NOTE — Patient Instructions (Addendum)
   Your a1c is 6.8%    Medications changes include :   none     Please followup in 6 months

## 2021-02-08 NOTE — Progress Notes (Signed)
Subjective:    Patient ID: Todd [name], male    DOB: [birth year], 80 y.o.   MRN: [id number]  HPI The patient is here for follow up of their chronic medical problems, including CAD, Afib, htn, DM, hld, gerd, pulm fibrosis  His sugars were very uncontrolled at his last visit.  He had stopped the Rybelsus because he was not able to afford it and he has since then been restarted on it.  Sugars at home are better- 133, 180, 132   He is less hungry - started after covid. Weight loss in past three months has been 18 lbs.  He is not exercising.     Medications and allergies reviewed with patient and updated if appropriate.  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   Hemorrhoids, internal, with bleeding 01/15/2015   Bursitis of left shoulder 01/14/2015   Type 2 diabetes, controlled, with peripheral circulatory disorder ([place]) 12/05/2013   PVD (peripheral vascular disease) ([place]) 11/12/2013   Atrial fibrillation ([place]) 11/03/2013   Carotid artery disease ([place]) 12/20/2009   OVERWEIGHT/OBESITY 11/17/2008   Hyperlipidemia 07/20/2008   Essential hypertension 07/20/2008   Coronary atherosclerosis 07/20/2008   ALLERGIC RHINITIS 07/20/2008   NEPHROLITHIASIS, HX OF 07/20/2008    Current Outpatient Medications on File Prior to Visit  Medication Sig Dispense Refill   albuterol (VENTOLIN HFA) 108 (90 Base) MCG/ACT inhaler Inhale 2 puffs into the lungs every 6 (six) hours as needed for wheezing or shortness of breath. 8 g 0    cholecalciferol (VITAMIN D3) 25 MCG (1000 UNIT) tablet Take 1,000 Units by mouth daily.     fexofenadine (ALLEGRA) 180 MG tablet Take 180 mg by mouth daily.     fluticasone (FLONASE) 50 MCG/ACT nasal spray Place 2 sprays into both nostrils daily. 16 g 6   glucose blood (ONE TOUCH ULTRA TEST) test strip Test blood sugar once daily 100 each 1   metFORMIN (GLUCOPHAGE-XR) 500 MG 24 hr tablet TAKE 2 TABLETS BY MOUTH TWICE A DAY 180 tablet 1   metoprolol tartrate (LOPRESSOR) 50 MG tablet TAKE 1 TABLET BY MOUTH 2 TIMES DAILY. 180 tablet 2   nitroGLYCERIN (NITROSTAT) 0.4 MG SL tablet Place 1 tablet (0.4 mg total) under the tongue every 5 (five) minutes as needed for chest pain. 100 tablet 3   Omega-3 Fatty Acids (FISH OIL PO) Take 1 tablet by mouth daily.     omeprazole (PRILOSEC) 20 MG capsule TAKE 1 CAPSULE BY MOUTH DAILY. 90 capsule 2   ONETOUCH DELICA LANCETS 06C MISC TEST BLOOD SUGAR ONCE DAILY 100 each 3   rivaroxaban (XARELTO) 20 MG TABS tablet Take 1 tablet (20 mg total) by mouth daily with supper. 90 tablet 1   Semaglutide (RYBELSUS) 14 MG TABS Take 14 mg by mouth daily. Via [employer] patient assistance 120 tablet 0   simvastatin (ZOCOR) 40 MG tablet TAKE 1 TABLET BY MOUTH EVERY EVENING. 90 tablet 2   triamcinolone cream (KENALOG) 0.1 %   3   No current facility-administered medications on file prior to visit.  Past Medical History:  Diagnosis Date   Allergic rhinitis    Asthma    Atrial fibrillation with rapid ventricular response ([place])    a. newly diagnosed 11/03/13, spont conv to NSR, placed on xarelto.   CAD (coronary artery disease)    a. 1999; s/p CABG: LIMA to LAD, SVG to OM1, SVG to OM2, SVG to RCA  b. Normal nuc 10/2013 (done because of new onset AF.)   Diabetes mellitus ([place])    HLD (hyperlipidemia)    HTN (hypertension)    Nephrolithiasis    Overweight(278.02)    PVD (peripheral vascular disease) ([place])    Carotid stenosis   Stroke [facility])     Past Surgical History:   Procedure Laterality Date   CAROTID ENDARTERECTOMY  1999   COLONOSCOPY  2014   negative;Dr Sharlett Iles   COLONOSCOPY W/ POLYPECTOMY  2009   Dr Sharlett Iles   CORONARY ARTERY BYPASS GRAFT  Aug.1999   HEMORRHOID BANDING     INTRACAPSULAR CATARACT EXTRACTION Bilateral 2018   NASAL SINUS SURGERY      Social History   Socioeconomic History   Marital status: Married    Spouse name: [name]   Number of children: 2   Years of education: Not on file   Highest education level: Not on file  Occupational History   Occupation: Press photographer Manager-retired  Tobacco Use   Smoking status: Never   Smokeless tobacco: Never  Vaping Use   Vaping Use: Never used  Substance and Sexual Activity   Alcohol use: No    Alcohol/week: 0.0 standard drinks   Drug use: No   Sexual activity: Yes  Other Topics Concern   Not on file  Social History Narrative   Not on file   Social Determinants of Health   Financial Resource Strain: Low Risk    Difficulty of Paying Living Expenses: Not hard at all  Food Insecurity: No Food Insecurity   Worried About Charity fundraiser in the Last Year: Never true   [place] in the Last Year: Never true  Transportation Needs: No Transportation Needs   Lack of Transportation (Medical): No   Lack of Transportation (Non-Medical): No  Physical Activity: Sufficiently Active   Days of Exercise per Week: 5 days   Minutes of Exercise per Session: 30 min  Stress: No Stress Concern Present   Feeling of Stress : Not at all  Social Connections: Socially Integrated   Frequency of Communication with Friends and Family: More than three times a week   Frequency of Social Gatherings with Friends and Family: Once a week   Attends Religious Services: More than 4 times per year   Active Member of [employer] or Organizations: No   Attends Music therapist: More than 4 times per year   Marital Status: Married    Family History  Problem Relation Age of Onset   Prostate cancer  Brother    Hemochromatosis Brother    Colon cancer Brother 25   Heart attack Brother 56   Asthma Sister    Heart attack Mother 65   Heart attack Brother 21   Diabetes Father        IDDM   Stroke Father 51    Review of Systems  Constitutional:  Negative for fever.  Respiratory:  Negative for cough, shortness of breath and wheezing.   Cardiovascular:  Negative for chest pain, palpitations and leg swelling.  Neurological:  Negative for light-headedness and headaches.  Objective:   Vitals:   02/09/21 1048  BP: 140/84  Pulse: 78  Temp: 98 F (36.7 C)  SpO2: 98%   BP Readings from Last 3 Encounters:  02/09/21 140/84  11/08/20 (!) 144/84  09/07/20 (!) 142/66   Wt Readings from Last 3 Encounters:  02/09/21 172 lb (78 kg)  11/08/20 190 lb (86.2 kg)  09/07/20 187 lb (84.8 kg)   Body mass index is 26.15 kg/m.   Physical Exam    Constitutional: Appears well-developed and well-nourished. No distress.  HENT:  Head: Normocephalic and atraumatic.  Neck: Neck supple. No tracheal deviation present. No thyromegaly present.  No cervical lymphadenopathy Cardiovascular: Normal rate, regular rhythm and normal heart sounds.   No murmur heard. No carotid bruit .  No edema Pulmonary/Chest: Effort normal and breath sounds normal. No respiratory distress. No has no wheezes. No rales.  Skin: Skin is warm and dry. Not diaphoretic.  Psychiatric: Normal mood and affect. Behavior is normal.      Assessment & Plan:    See Problem List for Assessment and Plan of chronic medical problems.    This visit occurred during the SARS-CoV-2 public health emergency.  Safety protocols were in place, including screening questions prior to the visit, additional usage of staff PPE, and extensive cleaning of exam room while observing appropriate contact time as indicated for disinfecting solutions.

## 2021-02-09 ENCOUNTER — Ambulatory Visit (INDEPENDENT_AMBULATORY_CARE_PROVIDER_SITE_OTHER): Payer: HMO | Admitting: Internal Medicine

## 2021-02-09 ENCOUNTER — Other Ambulatory Visit: Payer: Self-pay

## 2021-02-09 ENCOUNTER — Encounter: Payer: Self-pay | Admitting: Internal Medicine

## 2021-02-09 VITALS — BP 140/84 | HR 78 | Temp 98.0°F | Ht 68.0 in | Wt 172.0 lb

## 2021-02-09 DIAGNOSIS — I10 Essential (primary) hypertension: Secondary | ICD-10-CM | POA: Diagnosis not present

## 2021-02-09 DIAGNOSIS — I700 Atherosclerosis of aorta: Secondary | ICD-10-CM | POA: Diagnosis not present

## 2021-02-09 DIAGNOSIS — K219 Gastro-esophageal reflux disease without esophagitis: Secondary | ICD-10-CM

## 2021-02-09 DIAGNOSIS — I4891 Unspecified atrial fibrillation: Secondary | ICD-10-CM

## 2021-02-09 DIAGNOSIS — E1151 Type 2 diabetes mellitus with diabetic peripheral angiopathy without gangrene: Secondary | ICD-10-CM | POA: Diagnosis not present

## 2021-02-09 DIAGNOSIS — E7849 Other hyperlipidemia: Secondary | ICD-10-CM

## 2021-02-09 LAB — POCT GLYCOSYLATED HEMOGLOBIN (HGB A1C): Hemoglobin A1C: 6.8 % — AB (ref 4.0–5.6)

## 2021-02-09 NOTE — Assessment & Plan Note (Signed)
Chronic Continue simvastatin 40 mg daily LDL has been well controlled

## 2021-02-09 NOTE — Assessment & Plan Note (Signed)
Chronic BP well controlled at home, slightly higher here Continue metoprolol 50 mg bid

## 2021-02-09 NOTE — Assessment & Plan Note (Addendum)
Chronic Lab Results  Component Value Date   HGBA1C 10.1 (H) 11/08/2020   Not well controlled Restarted on Rybelsus at his last visit and we have increased-he is tolerating this well, except it is decreasing his appetite  -- monitor his weight at home closely - if he continues to lose weight - we may need to dec the rybelsus - I think this is decreasing his appetite Sugars at home controlled A1c today - 6.8%

## 2021-02-09 NOTE — Assessment & Plan Note (Signed)
Chronic Continue simvastatin 40 mg daily LDL has been at goal Discussed the importance of healthy diet and regular exercise

## 2021-02-15 DIAGNOSIS — I10 Essential (primary) hypertension: Secondary | ICD-10-CM | POA: Diagnosis not present

## 2021-02-15 DIAGNOSIS — G245 Blepharospasm: Secondary | ICD-10-CM | POA: Diagnosis not present

## 2021-02-15 DIAGNOSIS — Z961 Presence of intraocular lens: Secondary | ICD-10-CM | POA: Diagnosis not present

## 2021-02-15 DIAGNOSIS — H18513 Endothelial corneal dystrophy, bilateral: Secondary | ICD-10-CM | POA: Diagnosis not present

## 2021-02-15 DIAGNOSIS — H16223 Keratoconjunctivitis sicca, not specified as Sjogren's, bilateral: Secondary | ICD-10-CM | POA: Diagnosis not present

## 2021-02-15 DIAGNOSIS — H26491 Other secondary cataract, right eye: Secondary | ICD-10-CM | POA: Diagnosis not present

## 2021-02-15 DIAGNOSIS — E119 Type 2 diabetes mellitus without complications: Secondary | ICD-10-CM | POA: Diagnosis not present

## 2021-02-15 DIAGNOSIS — H18413 Arcus senilis, bilateral: Secondary | ICD-10-CM | POA: Diagnosis not present

## 2021-02-22 ENCOUNTER — Telehealth: Payer: Self-pay | Admitting: Cardiology

## 2021-02-22 NOTE — Telephone Encounter (Signed)
*  STAT* If patient is at the pharmacy, call can be transferred to refill team.   1. Which medications need to be refilled? (please list name of each medication and dose if known) rivaroxaban (XARELTO) 20 MG TABS tablet  2. Which pharmacy/location (including street and city if local pharmacy) is medication to be sent to? [facility] [place]  3. Do they need a 30 day or 90 day supply? 90 day   Patient runs out Friday night. [facility] [place].

## 2021-02-24 ENCOUNTER — Telehealth: Payer: Self-pay

## 2021-02-24 MED ORDER — RIVAROXABAN 20 MG PO TABS
20.0000 mg | ORAL_TABLET | Freq: Every day | ORAL | 1 refills | Status: DC
Start: 2021-02-24 — End: 2021-08-01

## 2021-02-24 NOTE — Telephone Encounter (Signed)
Prescription refill request for Xarelto received.  Indication: Atrial fib Last office visit: 08/23/20  Hochrein MD Weight: 84.2kg Age: 80 Scr: 0.83 on 11/08/20 CrCl: 84.2  Based on above findings Xarelto 20mg  once daily is the appropriate dose.  Refill approved,

## 2021-02-28 ENCOUNTER — Other Ambulatory Visit: Payer: Self-pay | Admitting: Cardiology

## 2021-02-28 ENCOUNTER — Other Ambulatory Visit: Payer: Self-pay | Admitting: Internal Medicine

## 2021-03-14 ENCOUNTER — Ambulatory Visit: Payer: HMO | Admitting: Pulmonary Disease

## 2021-03-14 ENCOUNTER — Encounter: Payer: Self-pay | Admitting: Pulmonary Disease

## 2021-03-14 ENCOUNTER — Other Ambulatory Visit: Payer: Self-pay

## 2021-03-14 VITALS — BP 122/72 | HR 87 | Temp 98.2°F | Ht 68.0 in | Wt 170.0 lb

## 2021-03-14 DIAGNOSIS — J849 Interstitial pulmonary disease, unspecified: Secondary | ICD-10-CM | POA: Diagnosis not present

## 2021-03-14 DIAGNOSIS — J8410 Pulmonary fibrosis, unspecified: Secondary | ICD-10-CM | POA: Diagnosis not present

## 2021-03-14 NOTE — Progress Notes (Signed)
Brashaun Saltzman Barberi    [id number]    July 16, 1941  Primary Care Physician:Burns, [name], MD  Referring Physician: Binnie Rail, MD [place],  [place] [address]  Chief complaint:   Follow up for cough Pulmonary fibrosis, Asbestosis Post COVID-19 in September 2021  HPI: Mr. Canty is a 80 year old with history of allergies, rhinitis, atrial fibrillation, hypertension, hyperlipidemia, diabetes, GERD Follow-up for stable asbestosis, chronic cough, COVID-19 infection in September 2021  Developed COVID-19 in September 2021.  He was sick for about a week.  Treated with outpatient monoclonal antibody therapy and did not require hospitalization His cough has worsened since his COVID-19 infection.  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  Overall cough improved though he has had an increase in symptoms after he moved to lawn outside 1 week ago  States that he feels well 99% of the time  Complains of intermittent hoarseness of voice   Outpatient Encounter Medications as of 03/14/2021  Medication Sig   cholecalciferol (VITAMIN D3) 25 MCG (1000 UNIT) tablet Take 1,000 Units by mouth daily.   fexofenadine (ALLEGRA) 180 MG tablet Take 180 mg by mouth daily.   fluticasone (FLONASE) 50 MCG/ACT nasal spray Place 2 sprays into both nostrils daily.   glucose blood (ONE TOUCH ULTRA TEST) test strip Test blood sugar once daily   metFORMIN (GLUCOPHAGE-XR) 500 MG 24 hr tablet TAKE 2 TABLETS BY MOUTH TWICE A DAY   metoprolol tartrate (LOPRESSOR) 50 MG tablet TAKE 1 TABLET BY MOUTH 2 TIMES DAILY.   nitroGLYCERIN (NITROSTAT) 0.4 MG SL tablet Place 1 tablet (0.4 mg total) under the tongue every 5 (five) minutes as needed for chest pain.   Omega-3 Fatty Acids (FISH OIL  PO) Take 1 tablet by mouth daily.   omeprazole (PRILOSEC) 20 MG capsule TAKE 1 CAPSULE BY MOUTH DAILY.   ONETOUCH DELICA LANCETS 99991111 MISC TEST BLOOD SUGAR ONCE DAILY   rivaroxaban (XARELTO) 20 MG TABS tablet Take 1 tablet (20 mg total) by mouth daily with supper.   Semaglutide (RYBELSUS) 14 MG TABS Take 14 mg by mouth daily. Via [employer] patient assistance   simvastatin (ZOCOR) 40 MG tablet TAKE 1 TABLET BY MOUTH EVERY EVENING.   triamcinolone cream (KENALOG) 0.1 %    [DISCONTINUED] albuterol (VENTOLIN HFA) 108 (90 Base) MCG/ACT inhaler Inhale 2 puffs into the lungs every 6 (six) hours as needed for wheezing or shortness of breath.   No facility-administered encounter medications on file as of 03/14/2021.   Physical Exam: Blood pressure 122/72, pulse 87, temperature 98.2 F (36.8 C), temperature source Oral, height '5\' 8"'$  (1.727 m), weight 170 lb (77.1 kg), SpO2 96 %. Gen:      No acute distress HEENT:  EOMI, sclera anicteric Neck:     No masses; no thyromegaly Lungs:    Clear to auscultation bilaterally; normal respiratory effort CV:         Regular rate and rhythm; no murmurs Abd:      + bowel sounds; soft, non-tender; no palpable masses, no distension Ext:    No edema; adequate peripheral perfusion Skin:      Warm and dry; no rash Neuro: alert and oriented x 3 Psych: normal mood and affect   Data  Reviewed: Imaging Chest x-ray 12/04/16-stable interstitial opacities CT abdomen 11/20/13-mild reticular opacities at the bases CT high-resolution 01/02/17- subpleural reticulation, mild traction bronchiectasis, possible early honeycombing with basal gradient. 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  Slightly worse compared to 2018 High-res CT 03/21/2019-probable UIP High-res CT 03/30/2020-unchanged probable UIP fibrosis High-res CT 09/02/2020- unchanged probable UIP fibrosis I have reviewed the images personally  PFTs  03/20/17- FVC 2.74 (72%], FEV1 2.38 (87%), F/F 87, TLC 62%, DLCO  15.89 (56%)  01/15/2018-FVC 2.74 [73%], FEV1 2.33 [86%], F/F 85, TLC 71%, DLCO 13.13 (46%)  07/23/2018-FVC 2.51 [69%), FEV1 2.10 [78%), DLCO 15.16 (53%) Mild restriction, moderate-severe diffusion defect.  04/02/2019 FVC 2.70 [72%], FEV1 2.18 [82%], F/F 81, DLCO 13.8 [60%] Moderate diffusion defect  08/31/2020 FVC 2.52 [65%], FEV1 2.25 [82%], F/F 89, TLC 4.64 [69%], DLCO 20.59 [88%] Mild restriction.  Improvement in diffusion capacity  FENO 12/18/16- 12  Labs ILD serologies 01/09/17- all negative except for mild elevation in SCL 75.  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 There is a possibility that this could be idiopathic pulmonary fibrosis but most CT scans and PFTs show stability.  We had several discussions with Mr. Cota and his wife regarding anti-fibrotic therapy.  Given stability of pulmonary fibrosis and lack of symptoms we have decided to hold off treatment.  They are wary of possible side effects.  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  He had mild symptoms which did not require hospitalization Follow-up CT and PFTs reviewed which thankfully did not show any worsening of fibrosis or lung capacity.  Actually his diffusion capacity has improved compared to prior tests  Chronic cough, hoarseness Continue Prilosec for GERD.  Increase Prilosec dose to 20 mg twice daily for couple of weeks.  If hoarseness of voice persists then consider ENT consult  Health maintenance 01/15/2015-Prevnar 13 05/05/2010-Pneumovax  Plan/Recommendations: - Prilosec dose to 20 mg twice daily - High-resolution CT in 6 months - Follow-up in 6 months.  Marshell Garfinkel MD [facility] 03/14/2021, 11:57 AM  CC: Binnie Rail, MD

## 2021-03-14 NOTE — Patient Instructions (Signed)
Increase Prilosec dose to 20 mg twice daily. Schedule high-resolution CT in 6 months Follow-up in clinic after scan

## 2021-03-14 NOTE — Addendum Note (Signed)
Addended by: Valerie Salts on: 03/14/2021 12:29 PM   Modules accepted: Orders

## 2021-03-15 ENCOUNTER — Telehealth: Payer: Self-pay | Admitting: Pulmonary Disease

## 2021-03-15 MED ORDER — OMEPRAZOLE 20 MG PO CPDR
20.0000 mg | DELAYED_RELEASE_CAPSULE | Freq: Two times a day (BID) | ORAL | 3 refills | Status: DC
Start: 2021-03-15 — End: 2021-05-23

## 2021-03-15 NOTE — Telephone Encounter (Signed)
Patient's wife is aware 

## 2021-03-15 NOTE — Telephone Encounter (Signed)
Prescription sent into the pharmacy

## 2021-03-17 ENCOUNTER — Telehealth: Payer: Self-pay | Admitting: Pharmacist

## 2021-03-17 NOTE — Progress Notes (Signed)
    Chronic Care Management Pharmacy Assistant   Name: SAAJAN BAGNOLI  MRN: [id number] DOB: 08-Jul-1941  error

## 2021-03-18 ENCOUNTER — Telehealth: Payer: HMO

## 2021-03-18 ENCOUNTER — Telehealth: Payer: Self-pay | Admitting: Pharmacist

## 2021-03-18 NOTE — Progress Notes (Deleted)
Chronic Care Management Pharmacy Note  03/18/2021 Name:  Todd [name] MRN:  [id number] DOB:  [birth year]  Summary: ***  Recommendations/Changes made from today's visit: ***  Plan: ***   Subjective: Todd [name] is an 80 y.o. year old male who is a primary patient of Burns, [name], MD.  The CCM team was consulted for assistance with disease management and care coordination needs.    Engaged with patient by telephone for follow up visit in response to provider referral for pharmacy case management and/or care coordination services.   Consent to Services:  The patient was given information about Chronic Care Management services, agreed to services, and gave verbal consent prior to initiation of services.  Please see initial visit note for detailed documentation.   Patient Care Team: Binnie Rail, MD as PCP - General (Internal Medicine) Minus Breeding, MD as PCP - Cardiology (Cardiology) Gatha Mayer, MD as Consulting Physician (Gastroenterology) Minus Breeding, MD as Consulting Physician (Cardiology) Vevelyn Royals, MD as Consulting Physician (Ophthalmology) Darleen Crocker, MD as Consulting Physician (Ophthalmology) Charlton Haws, [facility] as Pharmacist (Pharmacist)  Recent office visits: 02/09/21 Dr Quay Burow OV: chronic f/u; lost 18 lbs; A1c 6.8%, improved. 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  Hospital visits: None in previous 6 months   Objective:  Lab Results  Component Value Date   CREATININE 0.83 11/08/2020   BUN 16 11/08/2020   GFR 84.26 11/08/2020   GFRNONAA 79 05/07/2020   GFRAA 91 05/07/2020   NA 139 11/08/2020   K 4.3 11/08/2020   CALCIUM 9.3 11/08/2020   CO2 26 11/08/2020   GLUCOSE 165 (H) 11/08/2020    Lab Results  Component Value Date/Time   HGBA1C 6.8 (A) 02/09/2021 11:08 AM   HGBA1C 10.1 (H) 11/08/2020 10:11 AM    HGBA1C 8.7 (H) 05/07/2020 10:22 AM   GFR 84.26 11/08/2020 10:11 AM   GFR 69.29 11/05/2019 08:29 AM   MICROALBUR 1.9 05/07/2020 10:24 AM   MICROALBUR 1.2 05/06/2019 08:53 AM    Last diabetic Eye exam:  Lab Results  Component Value Date/Time   HMDIABEYEEXA No Retinopathy 11/30/2016 12:00 AM   HMDIABEYEEXA No Retinopathy 11/30/2016 12:00 AM   HMDIABEYEEXA No Retinopathy 11/30/2016 12:00 AM    Last diabetic Foot exam: No results found for: HMDIABFOOTEX   Lab Results  Component Value Date   CHOL 132 11/08/2020   HDL 37.10 (L) 11/08/2020   LDLCALC 65 11/08/2020   TRIG 150.0 (H) 11/08/2020   CHOLHDL 4 11/08/2020    Hepatic Function Latest Ref Rng & Units 11/08/2020 05/07/2020 11/05/2019  Total Protein 6.0 - 8.3 g/dL 7.3 7.4 7.5  Albumin 3.5 - 5.2 g/dL 4.1 - 4.0  AST 0 - 37 U/L 20 30 18   ALT 0 - 53 U/L 14 24 18   Alk Phosphatase 39 - 117 U/L 66 - 70  Total Bilirubin 0.2 - 1.2 mg/dL 0.7 0.5 0.7  Bilirubin, Direct <=0.2 mg/dL - - -    Lab Results  Component Value Date/Time   TSH 4.03 11/08/2020 10:11 AM   TSH 4.35 11/05/2019 08:29 AM   FREET4 0.92 10/29/2017 09:50 AM    CBC Latest Ref Rng & Units 11/08/2020 05/07/2020 11/05/2019  WBC 4.0 - 10.5 K/uL 6.3 6.2 7.6  Hemoglobin 13.0 - 17.0 g/dL 13.8 13.2 13.8  Hematocrit 39.0 - 52.0 % 39.8 38.9 39.7  Platelets 150.0 -  400.0 K/uL 167.0 172 167.0    No results found for: VD25OH  Clinical ASCVD: Yes  The 10-year ASCVD risk score Mikey Bussing DC Jr., et al., 2013) is: 50.9%   Values used to calculate the score:     Age: 81 years     Sex: Male     Is Non-Hispanic African American: No     Diabetic: Yes     Tobacco smoker: No     Systolic Blood Pressure: 333 mmHg     Is BP treated: Yes     HDL Cholesterol: 37.1 mg/dL     Total Cholesterol: 132 mg/dL    Depression screen [facility] 2/9 02/09/2021 06/08/2020 06/07/2020  Decreased Interest 0 0 (No Data)  Down, Depressed, Hopeless 0 0 -  PHQ - 2 Score 0 0 -  Altered sleeping - - -  Tired,  decreased energy - - -  Change in appetite - - -  Feeling bad or failure about yourself  - - -  Trouble concentrating - - -  Moving slowly or fidgety/restless - - -  Suicidal thoughts - - -  PHQ-9 Score - - -  Difficult doing work/chores - - -  Some recent data might be hidden    CHA2DS2-VASc Score = 5  The patient's score is based upon: CHF History: No HTN History: Yes Diabetes History: Yes Stroke History: No Vascular Disease History: Yes Age Score: 2 Gender Score: 0   {Confirm score is correct.  If not, click here to update score.  REFRESH note. :[id number]}  Social History   Tobacco Use  Smoking Status Never  Smokeless Tobacco Never   BP Readings from Last 3 Encounters:  03/14/21 122/72  02/09/21 140/84  11/08/20 (!) 144/84   Pulse Readings from Last 3 Encounters:  03/14/21 87  02/09/21 78  11/08/20 78   Wt Readings from Last 3 Encounters:  03/14/21 170 lb (77.1 kg)  02/09/21 172 lb (78 kg)  11/08/20 190 lb (86.2 kg)   BMI Readings from Last 3 Encounters:  03/14/21 25.85 kg/m  02/09/21 26.15 kg/m  11/08/20 28.89 kg/m    Assessment/Interventions: Review of patient past medical history, allergies, medications, health status, including review of consultants reports, laboratory and other test data, was performed as part of comprehensive evaluation and provision of chronic care management services.   SDOH:  (Social Determinants of Health) assessments and interventions performed: Yes  SDOH Screenings   Alcohol Screen: Low Risk    Last Alcohol Screening Score (AUDIT): 0  Depression (PHQ2-9): Low Risk    PHQ-2 Score: 0  Financial Resource Strain: Low Risk    Difficulty of Paying Living Expenses: Not hard at all  Food Insecurity: No Food Insecurity   Worried About Charity fundraiser in the Last Year: Never true   Ran Out of Food in the Last Year: Never true  Housing: Low Risk    Last Housing Risk Score: 0  Physical Activity: Sufficiently Active   Days of  Exercise per Week: 5 days   Minutes of Exercise per Session: 30 min  Social Connections: Engineer, building services of Communication with Friends and Family: More than three times a week   Frequency of Social Gatherings with Friends and Family: Once a week   Attends Religious Services: More than 4 times per year   Active Member of [employer] or Organizations: No   Attends Music therapist: More than 4 times per year   Marital Status: Married  Stress:  No Stress Concern Present   Feeling of Stress : Not at all  Tobacco Use: Low Risk    Smoking Tobacco Use: Never   Smokeless Tobacco Use: Never  Transportation Needs: No Transportation Needs   Lack of Transportation (Medical): No   Lack of Transportation (Non-Medical): No    CCM Care Plan  Allergies  Allergen Reactions   Amlodipine Besylate     Rash Because of a history of documented adverse serious drug reaction;Medi Alert bracelet  is recommended   Ivp Dye [Iodinated Diagnostic Agents]     Rash Because of a history of documented adverse serious drug reaction;Medi Alert bracelet  is recommended    Medications Reviewed Today     Reviewed by Valerie Salts, CMA (Certified Medical Assistant) on 03/14/21 at 1154  Med List Status: <None>   Medication Order Taking? Sig Documenting Provider Last Dose Status Informant  cholecalciferol (VITAMIN D3) 25 MCG (1000 UNIT) tablet [id number] Yes Take 1,000 Units by mouth daily. [provider] Taking Active   fexofenadine (ALLEGRA) 180 MG tablet [id number] Yes Take 180 mg by mouth daily. [provider] Taking Active   fluticasone (FLONASE) 50 MCG/ACT nasal spray [id number] Yes Place 2 sprays into both nostrils daily. Binnie Rail, MD Taking Active   glucose blood (ONE TOUCH ULTRA TEST) test strip [id number] Yes Test blood sugar once daily Burns, [name], MD Taking Active   metFORMIN (GLUCOPHAGE-XR) 500 MG 24 hr tablet [id number] Yes TAKE 2 TABLETS BY MOUTH TWICE A  DAY Burns, [name], MD Taking Active   metoprolol tartrate (LOPRESSOR) 50 MG tablet [id number] Yes TAKE 1 TABLET BY MOUTH 2 TIMES DAILY. Minus Breeding, MD Taking Active   nitroGLYCERIN (NITROSTAT) 0.4 MG SL tablet [id number] Yes Place 1 tablet (0.4 mg total) under the tongue every 5 (five) minutes as needed for chest pain. Minus Breeding, MD Taking Active   Omega-3 Fatty Acids (FISH OIL PO) [id number] Yes Take 1 tablet by mouth daily. [provider] Taking Active Self  omeprazole (PRILOSEC) 20 MG capsule [id number] Yes TAKE 1 CAPSULE BY MOUTH DAILY. Minus Breeding, MD Taking Active   [facility] LANCETS [address] [place] [id number] Yes TEST BLOOD SUGAR ONCE DAILY Burns, [name], MD Taking Active   rivaroxaban (XARELTO) 20 MG TABS tablet [id number] Yes Take 1 tablet (20 mg total) by mouth daily with supper. Minus Breeding, MD Taking Active   Semaglutide [facility]) 14 MG TABS [id number] Yes Take 14 mg by mouth daily. Via [employer] patient assistance Binnie Rail, MD Taking Active   simvastatin (ZOCOR) 40 MG tablet [id number] Yes TAKE 1 TABLET BY MOUTH EVERY EVENING. 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[place]) 05/05/2019   Asbestosis ([place]) 05/05/2019   Ventral hernia without obstruction or gangrene 56/15/3794   Umbilical hernia without obstruction or gangrene 10/31/2018   Eczema 10/29/2017   Hives 08/13/2017   Cough 12/04/2016   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   Hemorrhoids, internal, with bleeding 01/15/2015   Bursitis of left shoulder 01/14/2015   Type 2 diabetes, controlled, with peripheral circulatory disorder ([place]) 12/05/2013   PVD  (peripheral vascular disease) ([place])  11/12/2013   Atrial fibrillation ([place]) 11/03/2013   Carotid artery disease ([place]) 12/20/2009   OVERWEIGHT/OBESITY 11/17/2008   Hyperlipidemia 07/20/2008   Essential hypertension 07/20/2008   Coronary atherosclerosis 07/20/2008   ALLERGIC RHINITIS 07/20/2008   NEPHROLITHIASIS, HX OF 07/20/2008    Immunization History  Administered Date(s) Administered   Fluad Quad(high Dose 65+) 05/30/2019   Influenza Split 07/06/2011, 05/15/2012   Influenza Whole 05/12/2010   Influenza, High Dose Seasonal PF 05/30/2017, 05/02/2018, 06/12/2020   Influenza,inj,Quad PF,6+ Mos 05/21/2013   Influenza-Unspecified 05/20/2014, 06/11/2015, 05/24/2016   PFIZER(Purple Top)SARS-COV-2 Vaccination 10/13/2019, 11/03/2019, 10/12/2020   Pneumococcal Conjugate-13 01/15/2015   Pneumococcal Polysaccharide-23 05/22/2005, 05/05/2010   Tdap 12/03/2012   Zoster Recombinat (Shingrix) 12/19/2017, 03/04/2018   Zoster, Live 10/20/2010    Conditions to be addressed/monitored:  Hypertension, Hyperlipidemia, Diabetes, Atrial Fibrillation, Coronary Artery Disease, and Asthma  There are no care plans that you recently modified to display for this patient.    Medication Assistance: {MEDASSISTANCEINFO:25044}  Compliance/Adherence/Medication fill history: Care Gaps: Covid booster (due 02/09/21) Foot exam (due 10/31/19) Eye exam (due 11/30/17)  Star-Rating Drugs: Simvastatin - LF 12/21/20 Metformin - LF 02/28/21 x 30 ds  Patient's preferred pharmacy is:  [place], [address] [place] [address] Phone: [phone] Fax: [phone]  [id number] [place] [place]: [phone]: [phone] Uses pill box? {Yes or If no, why not?:20788} Pt endorses ***% compliance  We discussed: {Pharmacy options:24294} Patient decided  to: {US Pharmacy Plan:23885}  Care Plan and Follow Up Patient Decision:  {FOLLOWUP:24991}  Plan: {CM FOLLOW UP PPRFF:63846} ***    Current Barriers:  {pharmacybarriers:24917}  Pharmacist Clinical Goal(s):  Patient will {PHARMACYGOALCHOICES:24921} through collaboration with PharmD and provider.   Interventions: 1:1 collaboration with BBinnie Rail MD regarding development and update of comprehensive plan of care as evidenced by provider attestation and co-signature Inter-disciplinary care team collaboration (see longitudinal plan of care) Comprehensive medication review performed; medication list updated in electronic medical record  AFIB / Hypertension    Patient is currently rate controlled Patient checks BP at home 1-2x per week Patient home BP readings are ranging: "normal"   Patient has failed these meds in past: n/a Patient is currently controlled on the following medications: Metoprolol tartrate 50 mg BID Xarelto 20 mg daily   We discussed:  Benefits of medications; BP and HR goals; pt denies side effects or bleeding issues; Xarelto cost is high in donut hole but he did not qualify for patient assistance   Plan: Continue current medications   Hyperlipidemia    LDL goal < 70 Hx Atherosclerosis, PVD   Patient has failed these meds in past: n/a Patient is currently controlled on the following medications: Simvastatin 40 mg daily Nitroglycerin 0.4 mg SL prn - never used OTC omega-3 fatty acids    We discussed:  diet and exercise extensively; Cholesterol goals; benefits of statin for ASCVD risk reduction   Plan: Continue current medications   Diabetes    A1c goal <7%  Checking BG: Daily  Fasting BG: 140-190   Patient has failed these meds in past: glimepride (2016-2021), Rybelsus, Januvia, Onglyza Patient is currently uncontrolled on the following medications: Metformin ER 500 mg - 2 tab BID Rybelsus 14 mg daily Testing supplies   We discussed: diet  and exercise extensively; BG and A1c goals; pt could not afford Rybelsus  previously, however Rybelsus would be a better option for him for CV risk reduction and weight loss effects; pt should qualify for [employer] patient assistance and is willing to switch glipizide to Rybelsus   Plan: Recommend to continue current medication   Pulmonary Fibrosis    Last spirometry score 01/15/2018: -FEV1 86% predicted -FEV1/FVC 0.85 -FEV1 % change 7% post-bronchodilator   Patient has failed these meds in past: Advair Patient is currently controlled on the following medications: Albuterol HFA prn   Using maintenance inhaler regularly? No Frequency of rescue inhaler use:  daily   We discussed:  proper inhaler technique - pt demonstrated technique in office, he coughs immediately after administering the dose and reports that always happens; discussed importance of holding breath for at least 5 seconds after inhaling dose -of note pt reports he has no issues with breathing, just persistent cough. He has not noticed improvement with inhalers, but as above this may be because he is not absorbing most of the dose due to coughing -may consider a Respimat inhaler in future if needed, the mist may be more tolerable   Plan Use proper inhaler technique including holding breath after inhaling dose   Allergic rhinitis    Patient has failed these meds in past: n/a Patient is currently controlled on the following medications: Fexofenadine 180 mg daily Fluticasone nasal spray PRN   We discussed:  Pt believes nasal drainage is main contributor to his coughing issues; advised to use Flonase on a daily basis to help reduce drainage   Plan Continue current medications  Use flonase daily   GERD    Patient has failed these meds in past: n/a Patient is currently controlled on the following medications: Omeprazole 20 mg daily   We discussed: Pt denies heartburn/reflux but he did have issues swallowing previously  and PPI helps with this   Plan: Continue current medications    Patient Goals/Self-Care Activities Patient will:  - {pharmacypatientgoals:24919}

## 2021-03-18 NOTE — Telephone Encounter (Signed)
  Chronic Care Management   Outreach Note  03/18/2021 Name: Todd [name] MRN: [id number] DOB: 15-Jul-1941  Referred by: Binnie Rail, MD  Patient had a phone appointment scheduled with clinical pharmacist today.  An unsuccessful telephone outreach was attempted today. The patient was referred to the pharmacist for assistance with medications, care management and care coordination.   Patient will NOT be penalized in any way for missing a CCM appointment. The no-show fee does not apply.  If possible, a message was left to return call to: [phone] or to [facility]: [place], PharmD, Para [place], CPP Clinical Pharmacist [facility] [phone]

## 2021-03-24 ENCOUNTER — Other Ambulatory Visit: Payer: Self-pay | Admitting: Cardiology

## 2021-03-28 ENCOUNTER — Encounter: Payer: Self-pay | Admitting: Family Medicine

## 2021-03-28 ENCOUNTER — Ambulatory Visit: Payer: Self-pay

## 2021-03-28 ENCOUNTER — Ambulatory Visit (INDEPENDENT_AMBULATORY_CARE_PROVIDER_SITE_OTHER): Payer: HMO

## 2021-03-28 ENCOUNTER — Other Ambulatory Visit: Payer: Self-pay

## 2021-03-28 ENCOUNTER — Ambulatory Visit: Payer: HMO | Admitting: Family Medicine

## 2021-03-28 VITALS — BP 124/82 | HR 85 | Ht 68.0 in | Wt 170.0 lb

## 2021-03-28 DIAGNOSIS — M25511 Pain in right shoulder: Secondary | ICD-10-CM

## 2021-03-28 DIAGNOSIS — M12811 Other specific arthropathies, not elsewhere classified, right shoulder: Secondary | ICD-10-CM | POA: Diagnosis not present

## 2021-03-28 DIAGNOSIS — M19011 Primary osteoarthritis, right shoulder: Secondary | ICD-10-CM | POA: Diagnosis not present

## 2021-03-28 DIAGNOSIS — M12819 Other specific arthropathies, not elsewhere classified, unspecified shoulder: Secondary | ICD-10-CM | POA: Insufficient documentation

## 2021-03-28 DIAGNOSIS — M19019 Primary osteoarthritis, unspecified shoulder: Secondary | ICD-10-CM | POA: Insufficient documentation

## 2021-03-28 NOTE — Progress Notes (Signed)
[place] Phone: [phone] Subjective:    I'm seeing this patient by the request  of:  Binnie Rail, MD  CC: Right shoulder.  [id number]  Todd [name] is a 80 y.o. male coming in with complaint of R shoulder pain going on for almost a year. 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 Patient had [place] last September  Lost a lot of weight and states he has been hurting ever since. Not needing to take anything for pain.Locates pain to top and side of shoulder.         Past Medical History:  Diagnosis Date   Allergic rhinitis    Asthma    Atrial fibrillation with rapid ventricular response ([place])    a. newly diagnosed 11/03/13, spont conv to NSR, placed on xarelto.   CAD (coronary artery disease)    a. 1999; s/p CABG: LIMA to LAD, SVG to OM1, SVG to OM2, SVG to RCA  b. Normal nuc 10/2013 (done because of new onset AF.)   Diabetes mellitus ([place])    HLD (hyperlipidemia)    HTN (hypertension)    Nephrolithiasis    Overweight(278.02)    PVD (peripheral vascular disease) ([place])    Carotid stenosis   Stroke [facility])    Past Surgical History:  Procedure Laterality Date   CAROTID ENDARTERECTOMY  1999   COLONOSCOPY  2014   negative;Dr Sharlett Iles   COLONOSCOPY W/ POLYPECTOMY  2009   Dr Sharlett Iles   CORONARY ARTERY BYPASS GRAFT  Aug.1999   HEMORRHOID BANDING     INTRACAPSULAR CATARACT EXTRACTION Bilateral 2018   NASAL SINUS SURGERY     Social History   Socioeconomic History   Marital status: Married    Spouse name: [name]   Number of children: 2   Years of education: Not on file   Highest education level: Not on file  Occupational History   Occupation: Press photographer Manager-retired  Tobacco Use   Smoking status: Never   Smokeless tobacco: Never  Vaping Use   Vaping Use: Never used   Substance and Sexual Activity   Alcohol use: No    Alcohol/week: 0.0 standard drinks   Drug use: No   Sexual activity: Yes  Other Topics Concern   Not on file  Social History Narrative   Not on file   Social Determinants of Health   Financial Resource Strain: Low Risk    Difficulty of Paying Living Expenses: Not hard at all  Food Insecurity: No Food Insecurity   Worried About Charity fundraiser in the Last Year: Never true   [place] in the Last Year: Never true  Transportation Needs: No Transportation Needs   Lack of Transportation (Medical): No   Lack of Transportation (Non-Medical): No  Physical Activity: Sufficiently Active   Days of Exercise per Week: 5 days   Minutes of Exercise per Session: 30 min  Stress: No Stress Concern Present   Feeling of Stress : Not at all  Social Connections: Socially Integrated   Frequency of Communication with Friends and Family: More than three times a week   Frequency of Social Gatherings with Friends and Family: Once a week   Attends Religious Services: More than 4 times per year   Active Member of Clubs or Organizations: No  Attends Music therapist: More than 4 times per year   Marital Status: Married   Allergies  Allergen Reactions   Amlodipine Besylate     Rash Because of a history of documented adverse serious drug reaction;Medi Alert bracelet  is recommended   Ivp Dye [Iodinated Diagnostic Agents]     Rash Because of a history of documented adverse serious drug reaction;Medi Alert bracelet  is recommended   Family History  Problem Relation Age of Onset   Prostate cancer Brother    Hemochromatosis Brother    Colon cancer Brother 3   Heart attack Brother 74   Asthma Sister    Heart attack Mother 25   Heart attack Brother 13   Diabetes Father        IDDM   Stroke Father 34    Current Outpatient Medications (Endocrine & Metabolic):    metFORMIN (GLUCOPHAGE-XR) 500 MG 24 hr tablet, TAKE 2 TABLETS  BY MOUTH TWICE A DAY   Semaglutide (RYBELSUS) 14 MG TABS, Take 14 mg by mouth daily. Via [employer] patient assistance  Current Outpatient Medications (Cardiovascular):    metoprolol tartrate (LOPRESSOR) 50 MG tablet, TAKE 1 TABLET BY MOUTH 2 TIMES DAILY.   nitroGLYCERIN (NITROSTAT) 0.4 MG SL tablet, Place 1 tablet (0.4 mg total) under the tongue every 5 (five) minutes as needed for chest pain.   simvastatin (ZOCOR) 40 MG tablet, TAKE 1 TABLET BY MOUTH EVERY EVENING.  Current Outpatient Medications (Respiratory):    fexofenadine (ALLEGRA) 180 MG tablet, Take 180 mg by mouth daily.   fluticasone (FLONASE) 50 MCG/ACT nasal spray, Place 2 sprays into both nostrils daily.   Current Outpatient Medications (Hematological):    rivaroxaban (XARELTO) 20 MG TABS tablet, Take 1 tablet (20 mg total) by mouth daily with supper.  Current Outpatient Medications (Other):    cholecalciferol (VITAMIN D3) 25 MCG (1000 UNIT) tablet, Take 1,000 Units by mouth daily.   glucose blood (ONE TOUCH ULTRA TEST) test strip, Test blood sugar once daily   Omega-3 Fatty Acids (FISH OIL PO), Take 1 tablet by mouth daily.   omeprazole (PRILOSEC) 20 MG capsule, Take 1 capsule (20 mg total) by mouth 2 (two) times daily.   ONETOUCH DELICA LANCETS 99991111 MISC, TEST BLOOD SUGAR ONCE DAILY   triamcinolone cream (KENALOG) 0.1 %,    Reviewed prior external information including notes and imaging from  primary care provider As well as notes that were available from care everywhere and other healthcare systems.  Past medical history, social, surgical and family history all reviewed in electronic medical record.  No pertanent information unless stated regarding to the chief complaint.   Review of Systems:  No headache, visual changes, nausea, vomiting, diarrhea, constipation, dizziness, abdominal pain, skin rash, fevers, chills, night sweats, weight loss, swollen lymph nodes, body aches, joint swelling, chest pain, shortness of  breath, mood changes. POSITIVE muscle aches  Objective  Blood pressure 124/82, pulse 85, height '5\' 8"'$  (1.727 m), weight 170 lb (77.1 kg), SpO2 98 %.   General: No apparent distress alert and oriented x3 mood and affect normal, dressed appropriately.  HEENT: Pupils equal, extraocular movements intact  Respiratory: Patient's speak in full sentences and does not appear short of breath patient does have a small cough noted though. Cardiovascular: No lower extremity edema, non tender, no erythema  Gait normal with good balance and coordination.  MSK: Right shoulder exam shows the patient does have some decreased range of motion in internal and external.  Patient  does have weakness of the rotator cuff with 3 out of 5 strength compared to the contralateral side.  Patient does have arthritic changes of multiple other joints.  Limited muscular skeletal ultrasound was performed and interpreted by Hulan Saas, M  Limited ultrasound shows the patient does have severe arthritic changes of the acromioclavicular joint with effusion noted.  Patient also has what appears to be a chronic rotator cuff tear noted.  Mild narrowing of the glenohumeral joint noted. Impression: Rotator cuff arthropathy with acromioclavicular arthritis  Procedure: Real-time Ultrasound Guided Injection of right glenohumeral joint Device: GE Logiq Q7  Ultrasound guided injection is preferred based studies that show increased duration, increased effect, greater accuracy, decreased procedural pain, increased response rate with ultrasound guided versus blind injection.  Verbal informed consent obtained.  Time-out conducted.  Noted no overlying erythema, induration, or other signs of local infection.  Skin prepped in a sterile fashion.  Local anesthesia: Topical Ethyl chloride.  With sterile technique and under real time ultrasound guidance:  Joint visualized.  23g 1  inch needle inserted posterior approach. Pictures taken for needle  placement. Patient did have injection of  2 cc of 0.5% Marcaine, and 1.0 cc of Kenalog 40 mg/dL. Completed without difficulty  Pain immediately resolved suggesting accurate placement of the medication.  Advised to call if fevers/chills, erythema, induration, drainage, or persistent bleeding.  Impression: Technically successful ultrasound guided injection.  Procedure: Real-time Ultrasound Guided Injection of right AC joint  Device: GE Logiq Q7 Ultrasound guided injection is preferred based studies that show increased duration, increased effect, greater accuracy, decreased procedural pain, increased response rate, and decreased cost with ultrasound guided versus blind injection.  Verbal informed consent obtained.  Time-out conducted.  Noted no overlying erythema, induration, or other signs of local infection.  Skin prepped in a sterile fashion.  Local anesthesia: Topical Ethyl chloride.  With sterile technique and under real time ultrasound guidance: With a 25-gauge half inch needle injected with 0.5 cc of 0.5% Marcaine and 0.5 cc of Kenalog 40 mg/mL into the acromioclavicular joint from a superior Completed without difficulty  Pain immediately resolved suggesting accurate placement of the medication.  Advised to call if fevers/chills, erythema, induration, drainage, or persistent bleeding.  Impression: Technically successful ultrasound guided injection.   Impression and Recommendations:     The above documentation has been reviewed and is accurate and complete Lyndal Pulley, DO

## 2021-03-28 NOTE — Assessment & Plan Note (Addendum)
Chronic issue with exacerbation.  Patient given injection today and tolerated the procedure well.  Patient will monitor the symptoms noted at this time.  Patient has had difficulty previously.  Patient will get x-rays.  Follow-up with me again in 6 to 8 weeks.  Due to patient's other comorbidities he is likely not a surgical candidate and do not feel advanced imaging is warranted.

## 2021-03-28 NOTE — Assessment & Plan Note (Signed)
Patient given injection and tolerated the procedure very well.  Discussed icing regimen and home exercises.  Patient has many other comorbidities of wanting to feel better.  Patient would not want to have any surgical intervention.  We will get x-rays to further evaluate but hopefully patient responds well.  Follow-up again in 6 to 8 weeks.  After the injection already had improvement in range of motion.

## 2021-03-28 NOTE — Patient Instructions (Addendum)
Good to see you  Xray on your way out  Ice 20 minutes 2 times daily. Usually after activity and before bed. Exercises given today  Injections given today  See me again in 6-8 weeks

## 2021-04-12 ENCOUNTER — Telehealth: Payer: Self-pay | Admitting: Pharmacist

## 2021-04-12 NOTE — Progress Notes (Signed)
    Chronic Care Management Pharmacy Assistant   Name: Todd [name]  MRN: [id number] DOB: 01/01/1941   Reason for Encounter: Rybelsus Refill Form for [employer]    Recent office visits:  None since last CCM outreach.  Recent consult visits:  03/28/21- Dr. Hulan Saas- Sports medicine. Patient presented for right shoulder pain. Patient given cortisone injection in office. Will follow up in 6-8 weeks. No medication changes.  Hospital visits:  None in previous 6 months  Medications: Outpatient Encounter Medications as of 04/12/2021  Medication Sig   cholecalciferol (VITAMIN D3) 25 MCG (1000 UNIT) tablet Take 1,000 Units by mouth daily.   fexofenadine (ALLEGRA) 180 MG tablet Take 180 mg by mouth daily.   fluticasone (FLONASE) 50 MCG/ACT nasal spray Place 2 sprays into both nostrils daily.   glucose blood (ONE TOUCH ULTRA TEST) test strip Test blood sugar once daily   metFORMIN (GLUCOPHAGE-XR) 500 MG 24 hr tablet TAKE 2 TABLETS BY MOUTH TWICE A DAY   metoprolol tartrate (LOPRESSOR) 50 MG tablet TAKE 1 TABLET BY MOUTH 2 TIMES DAILY.   nitroGLYCERIN (NITROSTAT) 0.4 MG SL tablet Place 1 tablet (0.4 mg total) under the tongue every 5 (five) minutes as needed for chest pain.   Omega-3 Fatty Acids (FISH OIL PO) Take 1 tablet by mouth daily.   omeprazole (PRILOSEC) 20 MG capsule Take 1 capsule (20 mg total) by mouth 2 (two) times daily.   ONETOUCH DELICA LANCETS 99991111 MISC TEST BLOOD SUGAR ONCE DAILY   rivaroxaban (XARELTO) 20 MG TABS tablet Take 1 tablet (20 mg total) by mouth daily with supper.   Semaglutide (RYBELSUS) 14 MG TABS Take 14 mg by mouth daily. Via [employer] patient assistance   simvastatin (ZOCOR) 40 MG tablet TAKE 1 TABLET BY MOUTH EVERY EVENING.   triamcinolone cream (KENALOG) 0.1 %    No facility-administered encounter medications on file as of 04/12/2021.   Refill form for Rybelsus has been completed and sent to Charlene Brooke for signature and to fax to [facility].     Charlene Brooke, CPP notified  Margaretmary Dys, [place] Pharmacy Assistant [phone]

## 2021-04-14 NOTE — Progress Notes (Signed)
Subjective:    Patient ID: Todd [name], male    DOB: 01-Oct-1940, 80 y.o.   MRN: [id number]  HPI The patient is here for an acute visit.   Weight loss - likely a combination from covid - he still does not have all his taste back and rybelsus.  Weight loss did not start until after he was put on rybelsus.    [place]  - PND and cough - flonase, mucinex d and delsym, allegra   Sugar  126-140.    Medications and allergies reviewed with patient and updated if appropriate.  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   Hemorrhoids, internal, with bleeding 01/15/2015   Bursitis of left shoulder 01/14/2015   Type 2 diabetes, controlled, with peripheral circulatory disorder ([place]) 12/05/2013   PVD (peripheral vascular disease) ([place]) 11/12/2013   Atrial fibrillation ([place]) 11/03/2013   Carotid artery disease ([place]) 12/20/2009   OVERWEIGHT/OBESITY 11/17/2008   Hyperlipidemia 07/20/2008   Essential hypertension 07/20/2008   Coronary atherosclerosis 07/20/2008   ALLERGIC RHINITIS 07/20/2008   NEPHROLITHIASIS, HX OF 07/20/2008    Current Outpatient Medications on File Prior to Visit  Medication Sig Dispense Refill   cholecalciferol (VITAMIN D3) 25 MCG (1000 UNIT) tablet Take 1,000 Units by mouth daily.     fexofenadine (ALLEGRA) 180 MG tablet Take 180 mg by mouth daily.     fluticasone (FLONASE) 50 MCG/ACT nasal spray Place 2 sprays into  both nostrils daily. 16 g 6   glucose blood (ONE TOUCH ULTRA TEST) test strip Test blood sugar once daily 100 each 1   metFORMIN (GLUCOPHAGE-XR) 500 MG 24 hr tablet TAKE 2 TABLETS BY MOUTH TWICE A DAY 120 tablet 1   metoprolol tartrate (LOPRESSOR) 50 MG tablet TAKE 1 TABLET BY MOUTH 2 TIMES DAILY. 180 tablet 2   nitroGLYCERIN (NITROSTAT) 0.4 MG SL tablet Place 1 tablet (0.4 mg total) under the tongue every 5 (five) minutes as needed for chest pain. 100 tablet 3   Omega-3 Fatty Acids (FISH OIL PO) Take 1 tablet by mouth daily.     omeprazole (PRILOSEC) 20 MG capsule Take 1 capsule (20 mg total) by mouth 2 (two) times daily. 180 capsule 3   ONETOUCH DELICA LANCETS 99991111 MISC TEST BLOOD SUGAR ONCE DAILY 100 each 3   rivaroxaban (XARELTO) 20 MG TABS tablet Take 1 tablet (20 mg total) by mouth daily with supper. 90 tablet 1   Semaglutide (RYBELSUS) 14 MG TABS Take 14 mg by mouth daily. Via [employer] patient assistance 120 tablet 0   simvastatin (ZOCOR) 40 MG tablet TAKE 1 TABLET BY MOUTH EVERY EVENING. 90 tablet 2   triamcinolone cream (KENALOG) 0.1 %   3   No current facility-administered medications on file prior to visit.    Past Medical History:  Diagnosis Date   Allergic rhinitis    Asthma    Atrial fibrillation with rapid ventricular response ([place])    a. newly diagnosed 11/03/13, spont conv to NSR, placed  on xarelto.   CAD (coronary artery disease)    a. 1999; s/p CABG: LIMA to LAD, SVG to OM1, SVG to OM2, SVG to RCA  b. Normal nuc 10/2013 (done because of new onset AF.)   Diabetes mellitus ([place])    HLD (hyperlipidemia)    HTN (hypertension)    Nephrolithiasis    Overweight(278.02)    PVD (peripheral vascular disease) ([place])    Carotid stenosis   Stroke [facility])     Past Surgical History:  Procedure Laterality Date   CAROTID ENDARTERECTOMY  1999   COLONOSCOPY  2014   negative;Dr Sharlett Iles   COLONOSCOPY W/ POLYPECTOMY  2009   Dr Sharlett Iles   CORONARY ARTERY BYPASS GRAFT  Aug.1999    HEMORRHOID BANDING     INTRACAPSULAR CATARACT EXTRACTION Bilateral 2018   NASAL SINUS SURGERY      Social History   Socioeconomic History   Marital status: Married    Spouse name: [name]   Number of children: 2   Years of education: Not on file   Highest education level: Not on file  Occupational History   Occupation: Press photographer Manager-retired  Tobacco Use   Smoking status: Never   Smokeless tobacco: Never  Vaping Use   Vaping Use: Never used  Substance and Sexual Activity   Alcohol use: No    Alcohol/week: 0.0 standard drinks   Drug use: No   Sexual activity: Yes  Other Topics Concern   Not on file  Social History Narrative   Not on file   Social Determinants of Health   Financial Resource Strain: Low Risk    Difficulty of Paying Living Expenses: Not hard at all  Food Insecurity: No Food Insecurity   Worried About Charity fundraiser in the Last Year: Never true   [place] in the Last Year: Never true  Transportation Needs: No Transportation Needs   Lack of Transportation (Medical): No   Lack of Transportation (Non-Medical): No  Physical Activity: Sufficiently Active   Days of Exercise per Week: 5 days   Minutes of Exercise per Session: 30 min  Stress: No Stress Concern Present   Feeling of Stress : Not at all  Social Connections: Socially Integrated   Frequency of Communication with Friends and Family: More than three times a week   Frequency of Social Gatherings with Friends and Family: Once a week   Attends Religious Services: More than 4 times per year   Active Member of [employer] or Organizations: No   Attends Music therapist: More than 4 times per year   Marital Status: Married    Family History  Problem Relation Age of Onset   Prostate cancer Brother    Hemochromatosis Brother    Colon cancer Brother 43   Heart attack Brother 73   Asthma Sister    Heart attack Mother 91   Heart attack Brother 61   Diabetes Father        IDDM   Stroke  Father 6    Review of Systems  Constitutional:  Negative for fever.  HENT:  Positive for congestion (clear) and postnasal drip. Negative for sinus pain and sore throat.        Lump in throat - can't get it up or down  Respiratory:  Positive for cough.   Gastrointestinal:        Gerd controlled       Objective:   Vitals:   04/15/21 1112  BP: 128/70  Pulse: 81  Temp: 98.7 F (37.1 C)  SpO2: 99%   BP Readings from Last 3 Encounters:  04/15/21 128/70  03/28/21 124/82  03/14/21 122/72   Wt Readings from Last 3 Encounters:  04/15/21 168 lb (76.2 kg)  03/28/21 170 lb (77.1 kg)  03/14/21 170 lb (77.1 kg)   Body mass index is 25.54 kg/m.   Physical Exam    Constitutional: Appears well-developed and well-nourished. No distress.  Head: Normocephalic and atraumatic.  Neck: Neck supple. No tracheal deviation present. No thyromegaly present.  No cervical lymphadenopathy Cardiovascular: Normal rate, regular rhythm and normal heart sounds.  No murmur heard. No carotid bruit .  No edema Pulmonary/Chest: Effort normal and breath sounds normal. No respiratory distress. No has no wheezes. No rales.  Skin: Skin is warm and dry. Not diaphoretic.  Psychiatric: Normal mood and affect. Behavior is normal.       Assessment & Plan:    See Problem List for Assessment and Plan of chronic medical problems.    This visit occurred during the SARS-CoV-2 public health emergency.  Safety protocols were in place, including screening questions prior to the visit, additional usage of staff PPE, and extensive cleaning of exam room while observing appropriate contact time as indicated for disinfecting solutions.

## 2021-04-14 NOTE — Telephone Encounter (Signed)
Pt has appt with PCP 8/26. Will wait to send order form in case Rybelsus is reduced back to 7 mg in light of weight loss.

## 2021-04-15 ENCOUNTER — Encounter: Payer: Self-pay | Admitting: Internal Medicine

## 2021-04-15 ENCOUNTER — Ambulatory Visit (INDEPENDENT_AMBULATORY_CARE_PROVIDER_SITE_OTHER): Payer: HMO | Admitting: Internal Medicine

## 2021-04-15 ENCOUNTER — Other Ambulatory Visit: Payer: Self-pay

## 2021-04-15 DIAGNOSIS — R634 Abnormal weight loss: Secondary | ICD-10-CM

## 2021-04-15 DIAGNOSIS — E1151 Type 2 diabetes mellitus with diabetic peripheral angiopathy without gangrene: Secondary | ICD-10-CM

## 2021-04-15 MED ORDER — GLIPIZIDE 5 MG PO TABS: 5.0000 mg | ORAL_TABLET | Freq: Every day | ORAL | 5 refills | Status: DC

## 2021-04-15 MED ORDER — LEVOCETIRIZINE DIHYDROCHLORIDE 5 MG PO TABS: 5.0000 mg | ORAL_TABLET | Freq: Every evening | ORAL | 5 refills | Status: DC

## 2021-04-15 MED ORDER — RYBELSUS 3 MG PO TABS: 3.0000 mg | ORAL_TABLET | Freq: Every day | ORAL | 5 refills | Status: AC

## 2021-04-15 NOTE — Assessment & Plan Note (Signed)
Chronic Lab Results  Component Value Date   HGBA1C 6.8 (A) 02/09/2021   Well controlled Has lost a lot of weight on rybelsus and has no appetite -- decrease rybelsus to 3 mg, restart glipizide 5 mg and continue metformin xr 1000 mg bid

## 2021-04-15 NOTE — Assessment & Plan Note (Signed)
Acute 22 lb weight loss since 3/22 He started rybelsus in 3/22 and that is the cause Decrease rybelsus to 3 mg - that should help with appetite Monitor weight

## 2021-04-15 NOTE — Patient Instructions (Addendum)
     Medications changes include :   stop otc allergy medication ( allegra).  Start xyzal at night.  Decrease rybelsus to 3 mg and restart glipizide 5 mg daily.     Your prescription(s) have been submitted to your pharmacy. Please take as directed and contact our office if you believe you are having problem(s) with the medication(s).   Monitor your sugars

## 2021-04-19 ENCOUNTER — Other Ambulatory Visit: Payer: Self-pay | Admitting: Internal Medicine

## 2021-05-03 ENCOUNTER — Telehealth: Payer: Self-pay | Admitting: Pharmacist

## 2021-05-03 NOTE — Progress Notes (Addendum)
Chronic Care Management Pharmacy Assistant   Name: Todd [name]  MRN: [id number] DOB: [birth year]   Reason for Encounter: Disease State   Conditions to be addressed/monitored: DMII   Recent office visits:  05/06/21 Binnie Rail, MD (PCP) Cough, med changes: prednisone 10 mg  04/15/21 Binnie Rail, MD (PCP) Weight loss  Med changes: Start xyzal at bedtime, decrease rybelsus to 3 mg, and restart glipizide 5 mg daily  02/09/21 Binnie Rail, MD (PCP) Diabetes, no med changes  Recent consult visits:  03/28/21 Lyndal Pulley, DO-Sports Medicine (Acute pain of right shoulder)    03/14/21 Marshell Garfinkel, MD-Pulmonary Disease (hoarseness) Med changes: Prilosec dose to 20 mg twice daily   Hospital visits:  None in previous 6 months  Medications: Outpatient Encounter Medications as of 05/03/2021  Medication Sig   cholecalciferol (VITAMIN D3) 25 MCG (1000 UNIT) tablet Take 1,000 Units by mouth daily.   fluticasone (FLONASE) 50 MCG/ACT nasal spray Place 2 sprays into both nostrils daily.   glipiZIDE (GLUCOTROL) 5 MG tablet Take 1 tablet (5 mg total) by mouth daily before breakfast.   glucose blood (ONE TOUCH ULTRA TEST) test strip Test blood sugar once daily   levocetirizine (XYZAL) 5 MG tablet Take 1 tablet (5 mg total) by mouth every evening.   metFORMIN (GLUCOPHAGE-XR) 500 MG 24 hr tablet TAKE 2 TABLETS BY MOUTH TWICE A DAY   metoprolol tartrate (LOPRESSOR) 50 MG tablet TAKE 1 TABLET BY MOUTH 2 TIMES DAILY.   nitroGLYCERIN (NITROSTAT) 0.4 MG SL tablet Place 1 tablet (0.4 mg total) under the tongue every 5 (five) minutes as needed for chest pain.   Omega-3 Fatty Acids (FISH OIL PO) Take 1 tablet by mouth daily.   omeprazole (PRILOSEC) 20 MG capsule Take 1 capsule (20 mg total) by mouth 2 (two) times daily.   ONETOUCH DELICA LANCETS 99991111 MISC TEST BLOOD SUGAR ONCE DAILY   rivaroxaban (XARELTO) 20 MG TABS tablet Take 1 tablet (20 mg total) by mouth daily with supper.   Semaglutide  (RYBELSUS) 3 MG TABS Take 3 mg by mouth daily.   simvastatin (ZOCOR) 40 MG tablet TAKE 1 TABLET BY MOUTH EVERY EVENING.   triamcinolone cream (KENALOG) 0.1 %    No facility-administered encounter medications on file as of 05/03/2021.     Recent Relevant Labs: Lab Results  Component Value Date/Time   HGBA1C 6.8 (A) 02/09/2021 11:08 AM   HGBA1C 10.1 (H) 11/08/2020 10:11 AM   HGBA1C 8.7 (H) 05/07/2020 10:22 AM   MICROALBUR 1.9 05/07/2020 10:24 AM   MICROALBUR 1.2 05/06/2019 08:53 AM    Kidney Function Lab Results  Component Value Date/Time   CREATININE 0.83 11/08/2020 10:11 AM   CREATININE 0.93 05/07/2020 10:22 AM   CREATININE 1.04 11/05/2019 08:29 AM   CREATININE 1.23 (H) 08/03/2015 09:03 AM   GFR 84.26 11/08/2020 10:11 AM   GFRNONAA 79 05/07/2020 10:22 AM   GFRAA 91 05/07/2020 10:22 AM     Contacted patient on 05/03/21 to discuss diabetes disease state.   Current antihyperglycemic regimen:  Metformin 500 mg 2 tab twice daily,  Rybelsus 3 mg daily (Patient has another week left) Glipizide 5 mg daily   Patient verbally confirms he is taking the above medications as directed. Yes  What diet changes have been made to improve diabetes control?Patient has not made any new changes to diet  What recent interventions/DTPs have been made to improve glycemic control:  decrease rybelsus to 3 mg, restart glipizide 5 mg  and continue metformin xr 1000 mg bid, per Dr. Quay Burow 04/15/21  Have there been any recent hospitalizations or ED visits since last visit with CPP? No  Patient denies hypoglycemic symptoms, including None  Patient reports hyperglycemic reading because he is on prednisone  How often are you checking your blood sugar? once daily  What are your blood sugars ranging? 106-190 Before meals: 191  During the week, how often does your blood glucose drop below 70? Never  Are you checking your feet daily/regularly? No  Adherence Review: Is the patient currently on a  STATIN medication? Yes Is the patient currently on ACE/ARB medication? No Does the patient have >5 day gap between last estimated fill dates? No  Care Gaps: Annual wellness visit in last year? Yes Most Recent BP reading:128/70 04/15/21  If Diabetic: Most recent A1C reading:10.1 11/08/20 Last eye exam / retinopathy screening:11/30/16 Last diabetic foot exam:10/31/18  Star Rating Drugs:  Medication:  Last Fill: Day Supply Metformin 500 mg 04/20/21 30 Simvastatin 40 mg 03/24/21  90 Rybelsus 3 mg 06/07/20 30   PCP appointment on 08/03/21    [place] Pharmacist Assistant [phone]

## 2021-05-04 ENCOUNTER — Encounter: Payer: Self-pay | Admitting: Internal Medicine

## 2021-05-05 ENCOUNTER — Telehealth: Payer: Self-pay | Admitting: Internal Medicine

## 2021-05-05 DIAGNOSIS — R059 Cough, unspecified: Secondary | ICD-10-CM

## 2021-05-05 DIAGNOSIS — Z87442 Personal history of urinary calculi: Secondary | ICD-10-CM

## 2021-05-05 NOTE — Telephone Encounter (Signed)
Appointment made for tomorrow

## 2021-05-05 NOTE — Telephone Encounter (Signed)
ENT referral order for Dr. Benjamine Mola  If he is concerned that the change in bowel movements is related to the Rybelsus he can stop it temporarily.  He has been on the medication for a long time so this has not been a problem up until now I doubt it is from the Rybelsus.  His cough could be related to a lung infection or heartburn.  Does he want to come in for a visit or do a virtual visit?  Does he want to see pulmonary?

## 2021-05-05 NOTE — Progress Notes (Signed)
Subjective:    Patient ID: Todd [name], male    DOB: 1941-07-21, 80 y.o.   MRN: [id number]  This visit occurred during the SARS-CoV-2 public health emergency.  Safety protocols were in place, including screening questions prior to the visit, additional usage of staff PPE, and extensive cleaning of exam room while observing appropriate contact time as indicated for disinfecting solutions.    HPI The patient is here for an acute visit.   Cough productive of clear sputum-  he has a chronic cough -episodic coughing that can last 2-3 min - clear, thick mucus.  He denies shortness of breath, wheeze or chest tightness.  He denies other cold symptoms or fevers.   Change in BM's - has had some incontinence with diarrhea.  He feels like he is finished gets up but not finished.  Typically has diarrhea. Goes 1-2 times a day.  No abdominal pain   Medications and allergies reviewed with patient and updated if appropriate.  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[place]) 05/05/2019   Asbestosis ([place]) 05/05/2019   Ventral hernia without obstruction or gangrene [id number]   Umbilical hernia without obstruction or gangrene 10/31/2018   Eczema 10/29/2017   Hives 08/13/2017   Cough 12/04/2016   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   Hemorrhoids, internal, with bleeding 01/15/2015   Bursitis of left shoulder 01/14/2015   Type 2 diabetes, controlled, with peripheral circulatory disorder ([place]) 12/05/2013   PVD (peripheral vascular disease) ([place]) 11/12/2013   Atrial fibrillation ([place]) 11/03/2013   Carotid artery disease ([place]) 12/20/2009   OVERWEIGHT/OBESITY 11/17/2008   Hyperlipidemia  07/20/2008   Essential hypertension 07/20/2008   Coronary atherosclerosis 07/20/2008   ALLERGIC RHINITIS 07/20/2008   NEPHROLITHIASIS, HX OF 07/20/2008    Current Outpatient Medications on File Prior to Visit  Medication Sig Dispense Refill   cholecalciferol (VITAMIN D3) 25 MCG (1000 UNIT) tablet Take 1,000 Units by mouth daily.     fluticasone (FLONASE) 50 MCG/ACT nasal spray Place 2 sprays into both nostrils daily. 16 g 6   glipiZIDE (GLUCOTROL) 5 MG tablet Take 1 tablet (5 mg total) by mouth daily before breakfast. 30 tablet 5   glucose blood (ONE TOUCH ULTRA TEST) test strip Test blood sugar once daily 100 each 1   levocetirizine (XYZAL) 5 MG tablet Take 1 tablet (5 mg total) by mouth every evening. 30 tablet 5   metFORMIN (GLUCOPHAGE-XR) 500 MG 24 hr tablet TAKE 2 TABLETS BY MOUTH TWICE A DAY 120 tablet 1   metoprolol tartrate (LOPRESSOR) 50 MG tablet TAKE 1 TABLET BY MOUTH 2 TIMES DAILY. 180 tablet 2   nitroGLYCERIN (NITROSTAT) 0.4 MG SL tablet Place 1 tablet (0.4 mg total) under the tongue every 5 (five) minutes as needed for chest pain. 100 tablet 3   Omega-3 Fatty Acids (FISH OIL PO) Take 1 tablet by mouth daily.     omeprazole (PRILOSEC) 20 MG capsule Take 1 capsule (20 mg total) by mouth 2 (two) times daily. 180 capsule 3   ONETOUCH DELICA LANCETS 99991111 MISC TEST BLOOD SUGAR ONCE DAILY 100 each 3   rivaroxaban (XARELTO) 20 MG TABS tablet Take 1 tablet (20 mg total) by mouth daily with supper. 90 tablet  1   Semaglutide (RYBELSUS) 3 MG TABS Take 3 mg by mouth daily. 30 tablet 5   simvastatin (ZOCOR) 40 MG tablet TAKE 1 TABLET BY MOUTH EVERY EVENING. 90 tablet 2   triamcinolone cream (KENALOG) 0.1 %   3   No current facility-administered medications on file prior to visit.    Past Medical History:  Diagnosis Date   Allergic rhinitis    Asthma    Atrial fibrillation with rapid ventricular response ([place])    a. newly diagnosed 11/03/13, spont conv to NSR, placed on xarelto.   CAD  (coronary artery disease)    a. 1999; s/p CABG: LIMA to LAD, SVG to OM1, SVG to OM2, SVG to RCA  b. Normal nuc 10/2013 (done because of new onset AF.)   Diabetes mellitus ([place])    HLD (hyperlipidemia)    HTN (hypertension)    Nephrolithiasis    Overweight(278.02)    PVD (peripheral vascular disease) ([place])    Carotid stenosis   Stroke [facility])     Past Surgical History:  Procedure Laterality Date   CAROTID ENDARTERECTOMY  1999   COLONOSCOPY  2014   negative;Dr Sharlett Iles   COLONOSCOPY W/ POLYPECTOMY  2009   Dr Sharlett Iles   CORONARY ARTERY BYPASS GRAFT  Aug.1999   HEMORRHOID BANDING     INTRACAPSULAR CATARACT EXTRACTION Bilateral 2018   NASAL SINUS SURGERY      Social History   Socioeconomic History   Marital status: Married    Spouse name: [name]   Number of children: 2   Years of education: Not on file   Highest education level: Not on file  Occupational History   Occupation: Press photographer Manager-retired  Tobacco Use   Smoking status: Never   Smokeless tobacco: Never  Vaping Use   Vaping Use: Never used  Substance and Sexual Activity   Alcohol use: No    Alcohol/week: 0.0 standard drinks   Drug use: No   Sexual activity: Yes  Other Topics Concern   Not on file  Social History Narrative   Not on file   Social Determinants of Health   Financial Resource Strain: Low Risk    Difficulty of Paying Living Expenses: Not hard at all  Food Insecurity: No Food Insecurity   Worried About Charity fundraiser in the Last Year: Never true   [place] in the Last Year: Never true  Transportation Needs: No Transportation Needs   Lack of Transportation (Medical): No   Lack of Transportation (Non-Medical): No  Physical Activity: Sufficiently Active   Days of Exercise per Week: 5 days   Minutes of Exercise per Session: 30 min  Stress: No Stress Concern Present   Feeling of Stress : Not at all  Social Connections: Socially Integrated   Frequency of Communication with Friends and  Family: More than three times a week   Frequency of Social Gatherings with Friends and Family: Once a week   Attends Religious Services: More than 4 times per year   Active Member of [employer] or Organizations: No   Attends Music therapist: More than 4 times per year   Marital Status: Married    Family History  Problem Relation Age of Onset   Prostate cancer Brother    Hemochromatosis Brother    Colon cancer Brother 87   Heart attack Brother 50   Asthma Sister    Heart attack Mother 60   Heart attack Brother 57   Diabetes Father  IDDM   Stroke Father 65    Review of Systems  Constitutional:  Negative for chills and fever.  HENT:  Negative for congestion, sinus pressure and sinus pain.   Respiratory:  Positive for cough. Negative for chest tightness, shortness of breath and wheezing.       Objective:   Vitals:   05/06/21 1530  BP: 130/78  Pulse: 67  Temp: 98.7 F (37.1 C)  SpO2: 96%   BP Readings from Last 3 Encounters:  05/06/21 130/78  04/15/21 128/70  03/28/21 124/82   Wt Readings from Last 3 Encounters:  05/06/21 168 lb (76.2 kg)  04/15/21 168 lb (76.2 kg)  03/28/21 170 lb (77.1 kg)   Body mass index is 25.54 kg/m.   Physical Exam    Constitutional: Appears well-developed and well-nourished. No distress.  Head: Normocephalic and atraumatic.  Neck: Neck supple. No tracheal deviation present. No thyromegaly present.  No cervical lymphadenopathy Cardiovascular: Normal rate, regular rhythm and normal heart sounds.  No murmur heard. No edema Pulmonary/Chest: Effort normal and breath sounds normal. No respiratory distress. No has no wheezes. Dry bibasilar R > L rales.  Skin: Skin is warm and dry. Not diaphoretic.        Assessment & Plan:    See Problem List for Assessment and Plan of chronic medical problems.

## 2021-05-05 NOTE — Telephone Encounter (Signed)
Patient daughter calling in about possible side effects of Semaglutide (RYBELSUS) 3 MG TABS   Says patient is having Bms on himself bc he is not able to make it to the bathroom in time  Also wants to speak w/ provider about consistent cough patient has had for almost a year.. says he is coughing up clear foam  Would like for patient to have referral sent to new ENT as his current ENT provider is retiring  Please call [phone]

## 2021-05-06 ENCOUNTER — Ambulatory Visit (INDEPENDENT_AMBULATORY_CARE_PROVIDER_SITE_OTHER): Payer: HMO

## 2021-05-06 ENCOUNTER — Other Ambulatory Visit: Payer: Self-pay

## 2021-05-06 ENCOUNTER — Encounter: Payer: Self-pay | Admitting: Internal Medicine

## 2021-05-06 ENCOUNTER — Ambulatory Visit (INDEPENDENT_AMBULATORY_CARE_PROVIDER_SITE_OTHER): Payer: HMO | Admitting: Internal Medicine

## 2021-05-06 VITALS — BP 130/78 | HR 67 | Temp 98.7°F | Ht 68.0 in | Wt 168.0 lb

## 2021-05-06 DIAGNOSIS — R058 Other specified cough: Secondary | ICD-10-CM

## 2021-05-06 DIAGNOSIS — R059 Cough, unspecified: Secondary | ICD-10-CM | POA: Diagnosis not present

## 2021-05-06 DIAGNOSIS — Z23 Encounter for immunization: Secondary | ICD-10-CM | POA: Diagnosis not present

## 2021-05-06 DIAGNOSIS — R159 Full incontinence of feces: Secondary | ICD-10-CM

## 2021-05-06 MED ORDER — PREDNISONE 10 MG PO TABS: ORAL_TABLET | ORAL | 0 refills | Status: DC

## 2021-05-06 NOTE — Assessment & Plan Note (Signed)
Acute States diarrhea 1-2 times a day that results in fecal incontinence Often he will go to the bathroom and think he is done, but when he gets up he has not done and has no control No concerning symptoms of abdominal pain, blood in the stool Did have some weight loss in the past few months, which was related to the Rybelsus-weight stable now since decreasing dose Trial of daily fiber supplement-Metamucil or Benefiber powder If this is not helpful we will consider GI referral May need to consider holding metformin

## 2021-05-06 NOTE — Patient Instructions (Addendum)
  Have a chest xray today  ENT - Dr Benjamine Mola  Medications changes include :   Try metamucil or benefiber daily.  Prednisone taper for cough.    Your prescription(s) have been submitted to your pharmacy. Please take as directed and contact our office if you believe you are having problem(s) with the medication(s).

## 2021-05-06 NOTE — Assessment & Plan Note (Signed)
Chronic in nature He has a episodic productive cough of white/clear thick sputum without wheezing, shortness of breath or chest tightness.  No fever or other cold symptoms Will check chest x-ray, but I expect it to be without acute infection-this is a chronic issue and likely related to his chronic lung disease Prednisone in the past has helped-trial of prednisone taper 40 mg x 3 days and tapering by 10 mg every 3 days He will update me on how much this helps next week May need to see pulmonary

## 2021-05-22 ENCOUNTER — Encounter: Payer: Self-pay | Admitting: Internal Medicine

## 2021-05-22 NOTE — Progress Notes (Signed)
Subjective:    Patient ID: Todd [name], male    DOB: [birth year], 80 y.o.   MRN: [id number]  This visit occurred during the SARS-CoV-2 public health emergency.  Safety protocols were in place, including screening questions prior to the visit, additional usage of staff PPE, and extensive cleaning of exam room while observing appropriate contact time as indicated for disinfecting solutions.    HPI The patient is here for an acute visit.   Still has a cough - - still have drainage. The steroids helped about 60%.  Still with drainage.  He is taking the Xyzal daily.  He did use the Flonase, but ran out of it-cough is not much changed except for after steroids.  He does not think that he has GERD, but is not sure.  At his last visit I did refer him to ENT-his appointment is not until November.   His daughter is concerned because he stumbles a lot.  He does not fall.  If he walk straight he is fine, but if he turns he ends up stumbling and almost losing his balance.  The other day he was doing the lawn and he did the push mower and then got on the riding mower after a while of being on the riding mower he found himself leaning towards the right and he had a hard time getting back up straight.  He has a sensation of a ball in the plantar surface of his right foot-near the ball of his foot.  He feels like his feet swells at times.  He denies any numbness, tingling or pain in his feet.  He has good sensation.  For the past 2 weeks or so he has not felt well.  He feels very tired.  Sugars at home 90's-116  Medications and allergies reviewe controlled d with patient and updated if appropriate.  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[place]) 05/05/2019   Asbestosis ([place]) 05/05/2019   Ventral hernia without obstruction or gangrene 19/14/7829   Umbilical hernia without obstruction or gangrene 10/31/2018   Eczema 10/29/2017   Hives 08/13/2017   Cough 12/04/2016   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   Hemorrhoids, internal, with bleeding 01/15/2015   Bursitis of left shoulder 01/14/2015   Type 2 diabetes, controlled, with peripheral circulatory disorder ([place]) 12/05/2013   PVD (peripheral vascular disease) ([place]) 11/12/2013   Atrial fibrillation ([place]) 11/03/2013   Carotid artery disease ([place]) 12/20/2009   OVERWEIGHT/OBESITY 11/17/2008   Hyperlipidemia 07/20/2008   Essential hypertension 07/20/2008   Coronary atherosclerosis 07/20/2008   ALLERGIC RHINITIS 07/20/2008   NEPHROLITHIASIS, HX OF 07/20/2008    Current Outpatient Medications on File Prior to Visit  Medication Sig Dispense Refill   cholecalciferol (VITAMIN D3) 25 MCG (1000 UNIT) tablet Take 1,000 Units by mouth daily.     fluticasone (FLONASE) 50 MCG/ACT nasal spray Place 2 sprays into both nostrils daily. 16 g 6   glipiZIDE (GLUCOTROL) 5 MG tablet Take 1 tablet (5 mg total) by mouth daily before breakfast. 30 tablet 5   glucose blood (ONE TOUCH ULTRA TEST) test strip Test blood sugar once daily 100 each 1  levocetirizine (XYZAL) 5 MG tablet Take 1 tablet (5 mg total) by mouth every evening. 30 tablet 5   metFORMIN (GLUCOPHAGE-XR) 500 MG 24 hr tablet TAKE 2 TABLETS BY MOUTH TWICE A DAY 120 tablet 1   metoprolol tartrate (LOPRESSOR) 50 MG tablet TAKE 1 TABLET BY MOUTH 2 TIMES DAILY. 180 tablet 2   nitroGLYCERIN (NITROSTAT) 0.4 MG SL tablet Place 1 tablet (0.4 mg total) under the tongue every 5 (five) minutes as needed for chest pain. 100 tablet 3   Omega-3 Fatty Acids (FISH OIL PO) Take 1 tablet by mouth daily.     omeprazole (PRILOSEC) 20 MG  capsule Take 1 capsule (20 mg total) by mouth 2 (two) times daily. 180 capsule 3   ONETOUCH DELICA LANCETS 74B MISC TEST BLOOD SUGAR ONCE DAILY 100 each 3   predniSONE (DELTASONE) 10 MG tablet Take 4 tabs po qd x 3 days, then 3 tabs po qd x 3 days, then 2 tabs po qd x 3 days, then 1 tab po qd x 3 days 30 tablet 0   rivaroxaban (XARELTO) 20 MG TABS tablet Take 1 tablet (20 mg total) by mouth daily with supper. 90 tablet 1   simvastatin (ZOCOR) 40 MG tablet TAKE 1 TABLET BY MOUTH EVERY EVENING. 90 tablet 2   triamcinolone cream (KENALOG) 0.1 %   3   No current facility-administered medications on file prior to visit.    Past Medical History:  Diagnosis Date   Allergic rhinitis    Asthma    Atrial fibrillation with rapid ventricular response ([place])    a. newly diagnosed 11/03/13, spont conv to NSR, placed on xarelto.   CAD (coronary artery disease)    a. 1999; s/p CABG: LIMA to LAD, SVG to OM1, SVG to OM2, SVG to RCA  b. Normal nuc 10/2013 (done because of new onset AF.)   Diabetes mellitus ([place])    HLD (hyperlipidemia)    HTN (hypertension)    Nephrolithiasis    Overweight(278.02)    PVD (peripheral vascular disease) ([place])    Carotid stenosis   Stroke [facility])     Past Surgical History:  Procedure Laterality Date   CAROTID ENDARTERECTOMY  1999   COLONOSCOPY  2014   negative;Dr Sharlett Iles   COLONOSCOPY W/ POLYPECTOMY  2009   Dr Sharlett Iles   CORONARY ARTERY BYPASS GRAFT  Aug.1999   HEMORRHOID BANDING     INTRACAPSULAR CATARACT EXTRACTION Bilateral 2018   NASAL SINUS SURGERY      Social History   Socioeconomic History   Marital status: Married    Spouse name: [name]   Number of children: 2   Years of education: Not on file   Highest education level: Not on file  Occupational History   Occupation: Press photographer Manager-retired  Tobacco Use   Smoking status: Never   Smokeless tobacco: Never  Vaping Use   Vaping Use: Never used  Substance and Sexual Activity   Alcohol use: No     Alcohol/week: 0.0 standard drinks   Drug use: No   Sexual activity: Yes  Other Topics Concern   Not on file  Social History Narrative   Not on file   Social Determinants of Health   Financial Resource Strain: Low Risk    Difficulty of Paying Living Expenses: Not hard at all  Food Insecurity: No Food Insecurity   Worried About Charity fundraiser in the Last Year: Never true   [place] in the Last Year: Never true  Transportation Needs: No Data processing manager (Medical): No   Lack of Transportation (Non-Medical): No  Physical Activity: Sufficiently Active   Days of Exercise per Week: 5 days   Minutes of Exercise per Session: 30 min  Stress: No Stress Concern Present   Feeling of Stress : Not at all  Social Connections: Socially Integrated   Frequency of Communication with Friends and Family: More than three times a week   Frequency of Social Gatherings with Friends and Family: Once a week   Attends Religious Services: More than 4 times per year   Active Member of [employer] or Organizations: No   Attends Music therapist: More than 4 times per year   Marital Status: Married    Family History  Problem Relation Age of Onset   Prostate cancer Brother    Hemochromatosis Brother    Colon cancer Brother 42   Heart attack Brother 17   Asthma Sister    Heart attack Mother 75   Heart attack Brother 23   Diabetes Father        IDDM   Stroke Father 45    Review of Systems  Constitutional:  Negative for fever.  Respiratory:  Positive for cough (dry). Negative for shortness of breath.   Cardiovascular:  Negative for chest pain, palpitations and leg swelling.  Neurological:  Negative for light-headedness and headaches.      Objective:   Vitals:   05/23/21 1421  BP: 124/80  Pulse: 77  Temp: 98.9 F (37.2 C)  SpO2: 96%   BP Readings from Last 3 Encounters:  05/23/21 124/80  05/06/21 130/78  04/15/21 128/70   Wt Readings from  Last 3 Encounters:  05/23/21 168 lb (76.2 kg)  05/06/21 168 lb (76.2 kg)  04/15/21 168 lb (76.2 kg)   Body mass index is 25.54 kg/m.   Physical Exam    Constitutional: Appears well-developed and well-nourished. No distress.  Head: Normocephalic and atraumatic.  Neck: Neck supple. No tracheal deviation present. No thyromegaly present.  No cervical lymphadenopathy Cardiovascular: Normal rate, regular rhythm and normal heart sounds.  No murmur heard. No carotid bruit .  Trace swelling right dorsal surface of foot edema Pulmonary/Chest: Effort normal and breath sounds normal. No respiratory distress. No has no wheezes. No rales. Abdomen: Soft, nontender, nondistended Musculoskeletal: No tenderness dorsal/plantar aspect of right foot-no swelling or mass plantar surface of right foot. Skin: Skin is warm and dry. Not diaphoretic.  Psychiatric: Normal mood and affect. Behavior is normal.   Diabetic Foot Exam - Simple   Simple Foot Form Diabetic Foot exam was performed with the following findings: Yes 05/23/2021  7:17 PM  Visual Inspection No deformities, no ulcerations, no other skin breakdown bilaterally: Yes Sensation Testing Intact to touch and monofilament testing bilaterally: Yes Pulse Check Posterior Tibialis and Dorsalis pulse intact bilaterally: Yes Comments        Assessment & Plan:    See Problem List for Assessment and Plan of chronic medical problems.

## 2021-05-23 ENCOUNTER — Other Ambulatory Visit: Payer: Self-pay

## 2021-05-23 ENCOUNTER — Ambulatory Visit (INDEPENDENT_AMBULATORY_CARE_PROVIDER_SITE_OTHER): Payer: HMO | Admitting: Internal Medicine

## 2021-05-23 ENCOUNTER — Encounter: Payer: Self-pay | Admitting: Internal Medicine

## 2021-05-23 VITALS — BP 124/80 | HR 77 | Temp 98.9°F | Ht 68.0 in | Wt 168.0 lb

## 2021-05-23 DIAGNOSIS — R053 Chronic cough: Secondary | ICD-10-CM

## 2021-05-23 DIAGNOSIS — E1151 Type 2 diabetes mellitus with diabetic peripheral angiopathy without gangrene: Secondary | ICD-10-CM | POA: Diagnosis not present

## 2021-05-23 DIAGNOSIS — I10 Essential (primary) hypertension: Secondary | ICD-10-CM | POA: Diagnosis not present

## 2021-05-23 DIAGNOSIS — R2681 Unsteadiness on feet: Secondary | ICD-10-CM | POA: Insufficient documentation

## 2021-05-23 DIAGNOSIS — K219 Gastro-esophageal reflux disease without esophagitis: Secondary | ICD-10-CM

## 2021-05-23 DIAGNOSIS — R5383 Other fatigue: Secondary | ICD-10-CM | POA: Diagnosis not present

## 2021-05-23 LAB — CBC WITH DIFFERENTIAL/PLATELET
Basophils Absolute: 0 10*3/uL (ref 0.0–0.1)
Basophils Relative: 0.3 % (ref 0.0–3.0)
Eosinophils Absolute: 0 10*3/uL (ref 0.0–0.7)
Eosinophils Relative: 0.4 % (ref 0.0–5.0)
HCT: 37.4 % — ABNORMAL LOW (ref 39.0–52.0)
Hemoglobin: 13 g/dL (ref 13.0–17.0)
Lymphocytes Relative: 35.2 % (ref 12.0–46.0)
Lymphs Abs: 2.7 10*3/uL (ref 0.7–4.0)
MCHC: 34.6 g/dL (ref 30.0–36.0)
MCV: 104.4 fl — ABNORMAL HIGH (ref 78.0–100.0)
Monocytes Absolute: 1.2 10*3/uL — ABNORMAL HIGH (ref 0.1–1.0)
Monocytes Relative: 15.5 % — ABNORMAL HIGH (ref 3.0–12.0)
Neutro Abs: 3.7 10*3/uL (ref 1.4–7.7)
Neutrophils Relative %: 48.6 % (ref 43.0–77.0)
Platelets: 126 10*3/uL — ABNORMAL LOW (ref 150.0–400.0)
RBC: 3.59 Mil/uL — ABNORMAL LOW (ref 4.22–5.81)
RDW: 16.1 % — ABNORMAL HIGH (ref 11.5–15.5)
WBC: 7.7 10*3/uL (ref 4.0–10.5)

## 2021-05-23 LAB — COMPREHENSIVE METABOLIC PANEL
ALT: 11 U/L (ref 0–53)
AST: 17 U/L (ref 0–37)
Albumin: 3.8 g/dL (ref 3.5–5.2)
Alkaline Phosphatase: 64 U/L (ref 39–117)
BUN: 16 mg/dL (ref 6–23)
CO2: 26 mEq/L (ref 19–32)
Calcium: 9.5 mg/dL (ref 8.4–10.5)
Chloride: 104 mEq/L (ref 96–112)
Creatinine, Ser: 0.8 mg/dL (ref 0.40–1.50)
GFR: 84.88 mL/min (ref >60.00)
Glucose, Bld: 90 mg/dL (ref 70–99)
Potassium: 4.3 mEq/L (ref 3.5–5.1)
Sodium: 139 mEq/L (ref 135–145)
Total Bilirubin: 0.7 mg/dL (ref 0.2–1.2)
Total Protein: 7.2 g/dL (ref 6.0–8.3)

## 2021-05-23 LAB — VITAMIN B12: Vitamin B-12: 99 pg/mL — ABNORMAL LOW (ref 211–911)

## 2021-05-23 LAB — HEMOGLOBIN A1C: Hgb A1c MFr Bld: 7.2 % — ABNORMAL HIGH (ref 4.6–6.5)

## 2021-05-23 LAB — TSH: TSH: 4.45 u[IU]/mL (ref 0.35–5.50)

## 2021-05-23 MED ORDER — OMEPRAZOLE 40 MG PO CPDR: 40.0000 mg | DELAYED_RELEASE_CAPSULE | Freq: Two times a day (BID) | ORAL | 5 refills | Status: DC

## 2021-05-23 NOTE — Assessment & Plan Note (Signed)
Chronic Sugars well controlled at home We will hold the Rybelsus for 1 month to see if his energy level improves as well as his appetite Continue glipizide 5 mg daily and metformin XR 500 mg-2 tabs twice daily Check A1c today Follow-up in 2 months

## 2021-05-23 NOTE — Assessment & Plan Note (Signed)
Acute Experiencing fatigue for a couple of weeks or so No obvious cause Check CBC, CMP, A1c, TSH, B12 Will hold the Rybelsus for 1 month to see if that helps since this has caused significant decreased appetite and weight loss Will consider further adjustment of medication depending on response to above

## 2021-05-23 NOTE — Patient Instructions (Addendum)
   Blood work was ordered.    Medications changes include :   increase the omeprazole 40 mg to twice a day.  Hold the Rybelsus for one month to see how you feel.     Your prescription(s) have been submitted to your pharmacy. Please take as directed and contact our office if you believe you are having problem(s) with the medication(s).   A referral was ordered for neurology.    [facility] Located in: [facility] Address: [address], [place], [place] [address] Phone: [phone]

## 2021-05-23 NOTE — Assessment & Plan Note (Signed)
Acute Gait is unsteady-typically only when he turns, not when he walks straight Denies any true lightheadedness or dizziness ?  Vestibular in nature Will refer to neurology for further evaluation He has been taking his Xarelto 20 mg daily so I do not think he is at risk for stroke the symptoms are not consistent with stroke

## 2021-05-23 NOTE — Assessment & Plan Note (Signed)
Chronic Likely multifactorial He is following with pulmonary and will see him early next year Started Xyzal-not much improvement in cough Cough improved after prednisone ?  GERD contributing-increased omeprazole to 40 mg twice daily Has appointment with ENT

## 2021-05-23 NOTE — Assessment & Plan Note (Signed)
Chronic ?  GERD truly controlled or not Referral ordered for ENT at his last visit-they will see if he can get in sooner at [facility] Will increase omeprazole to 40 mg twice daily to see if that helps with his cough

## 2021-05-23 NOTE — Assessment & Plan Note (Signed)
Chronic Blood pressure well controlled CMP Continue metoprolol 50 mg twice daily 

## 2021-05-24 ENCOUNTER — Encounter: Payer: Self-pay | Admitting: Neurology

## 2021-05-24 ENCOUNTER — Encounter: Payer: Self-pay | Admitting: Internal Medicine

## 2021-05-24 DIAGNOSIS — Z961 Presence of intraocular lens: Secondary | ICD-10-CM | POA: Diagnosis not present

## 2021-05-24 DIAGNOSIS — E538 Deficiency of other specified B group vitamins: Secondary | ICD-10-CM | POA: Insufficient documentation

## 2021-05-24 DIAGNOSIS — H18413 Arcus senilis, bilateral: Secondary | ICD-10-CM | POA: Diagnosis not present

## 2021-05-24 DIAGNOSIS — G245 Blepharospasm: Secondary | ICD-10-CM | POA: Diagnosis not present

## 2021-05-24 DIAGNOSIS — H16223 Keratoconjunctivitis sicca, not specified as Sjogren's, bilateral: Secondary | ICD-10-CM | POA: Diagnosis not present

## 2021-05-25 NOTE — Progress Notes (Signed)
[place] Phone: [phone] Subjective:   [name], am serving as a scribe for Dr. Hulan Saas.  This visit occurred during the SARS-CoV-2 public health emergency.  Safety protocols were in place, including screening questions prior to the visit, additional usage of staff PPE, and extensive cleaning of exam room while observing appropriate contact time as indicated for disinfecting solutions.   I'm seeing this patient by the request  of:  Binnie Rail, MD  CC: Shoulder pain follow-up  [id number]  03/28/2021 Chronic issue with exacerbation.  Patient given injection today and tolerated the procedure well.  Patient will monitor the symptoms noted at this time.  Patient has had difficulty previously.  Patient will get x-rays.  Follow-up with me again in 6 to 8 weeks.  Due to patient's other comorbidities he is likely not a surgical candidate and do not feel advanced imaging is warranted.  Patient given injection and tolerated the procedure very well.  Discussed icing regimen and home exercises.  Patient has many other comorbidities of wanting to feel better.  Patient would not want to have any surgical intervention.  We will get x-rays to further evaluate but hopefully patient responds well.  Follow-up again in 6 to 8 weeks.  After the injection already had improvement in range of motion.  Update 05/26/2021 Todd [name] Todd [name] is a 80 y.o. male coming in with complaint of R shoulder pain. Patient states that his pain decreased somewhat. Having hard time lifting jug of milk.  Patient states that overall does think it is better.  Has had improvement in range of motion but continues to have the weakness.  Not giving him any difficulty at night.  No radiation down the arm.     Past Medical History:  Diagnosis Date   Allergic rhinitis    Asthma    Atrial fibrillation with rapid ventricular response ([place])    a. newly  diagnosed 11/03/13, spont conv to NSR, placed on xarelto.   CAD (coronary artery disease)    a. 1999; s/p CABG: LIMA to LAD, SVG to OM1, SVG to OM2, SVG to RCA  b. Normal nuc 10/2013 (done because of new onset AF.)   Diabetes mellitus ([place])    HLD (hyperlipidemia)    HTN (hypertension)    Nephrolithiasis    Overweight(278.02)    PVD (peripheral vascular disease) ([place])    Carotid stenosis   Stroke [facility])    Past Surgical History:  Procedure Laterality Date   CAROTID ENDARTERECTOMY  1999   COLONOSCOPY  2014   negative;Dr Sharlett Iles   COLONOSCOPY W/ POLYPECTOMY  2009   Dr Sharlett Iles   CORONARY ARTERY BYPASS GRAFT  Aug.1999   HEMORRHOID BANDING     INTRACAPSULAR CATARACT EXTRACTION Bilateral 2018   NASAL SINUS SURGERY     Social History   Socioeconomic History   Marital status: Married    Spouse name: [name]   Number of children: 2   Years of education: Not on file   Highest education level: Not on file  Occupational History   Occupation: Sales Manager-retired  Tobacco Use   Smoking status: Never   Smokeless tobacco: Never  Vaping Use   Vaping Use: Never used  Substance and Sexual Activity   Alcohol use: No    Alcohol/week: 0.0 standard drinks   Drug use: No   Sexual activity: Yes  Other Topics Concern   Not on file  Social History  Narrative   Not on file   Social Determinants of Health   Financial Resource Strain: Low Risk    Difficulty of Paying Living Expenses: Not hard at all  Food Insecurity: No Food Insecurity   Worried About Charity fundraiser in the Last Year: Never true   [place] in the Last Year: Never true  Transportation Needs: No Transportation Needs   Lack of Transportation (Medical): No   Lack of Transportation (Non-Medical): No  Physical Activity: Sufficiently Active   Days of Exercise per Week: 5 days   Minutes of Exercise per Session: 30 min  Stress: No Stress Concern Present   Feeling of Stress : Not at all  Social Connections: Socially  Integrated   Frequency of Communication with Friends and Family: More than three times a week   Frequency of Social Gatherings with Friends and Family: Once a week   Attends Religious Services: More than 4 times per year   Active Member of [employer] or Organizations: No   Attends Music therapist: More than 4 times per year   Marital Status: Married   Allergies  Allergen Reactions   Amlodipine Besylate     Rash Because of a history of documented adverse serious drug reaction;Medi Alert bracelet  is recommended   Ivp Dye [Iodinated Diagnostic Agents]     Rash Because of a history of documented adverse serious drug reaction;Medi Alert bracelet  is recommended   Family History  Problem Relation Age of Onset   Prostate cancer Brother    Hemochromatosis Brother    Colon cancer Brother 26   Heart attack Brother 31   Asthma Sister    Heart attack Mother 71   Heart attack Brother 31   Diabetes Father        IDDM   Stroke Father 63    Current Outpatient Medications (Endocrine & Metabolic):    glipiZIDE (GLUCOTROL) 5 MG tablet, Take 1 tablet (5 mg total) by mouth daily before breakfast.   metFORMIN (GLUCOPHAGE-XR) 500 MG 24 hr tablet, TAKE 2 TABLETS BY MOUTH TWICE A DAY  Current Outpatient Medications (Cardiovascular):    metoprolol tartrate (LOPRESSOR) 50 MG tablet, TAKE 1 TABLET BY MOUTH 2 TIMES DAILY.   nitroGLYCERIN (NITROSTAT) 0.4 MG SL tablet, Place 1 tablet (0.4 mg total) under the tongue every 5 (five) minutes as needed for chest pain.   simvastatin (ZOCOR) 40 MG tablet, TAKE 1 TABLET BY MOUTH EVERY EVENING.  Current Outpatient Medications (Respiratory):    fluticasone (FLONASE) 50 MCG/ACT nasal spray, Place 2 sprays into both nostrils daily.   levocetirizine (XYZAL) 5 MG tablet, Take 1 tablet (5 mg total) by mouth every evening.   Current Outpatient Medications (Hematological):    rivaroxaban (XARELTO) 20 MG TABS tablet, Take 1 tablet (20 mg total) by mouth daily  with supper.  Current Outpatient Medications (Other):    cholecalciferol (VITAMIN D3) 25 MCG (1000 UNIT) tablet, Take 1,000 Units by mouth daily.   glucose blood (ONE TOUCH ULTRA TEST) test strip, Test blood sugar once daily   Omega-3 Fatty Acids (FISH OIL PO), Take 1 tablet by mouth daily.   omeprazole (PRILOSEC) 40 MG capsule, Take 1 capsule (40 mg total) by mouth 2 (two) times daily.   ONETOUCH DELICA LANCETS 24Q MISC, TEST BLOOD SUGAR ONCE DAILY   triamcinolone cream (KENALOG) 0.1 %,      Review of Systems:  No headache, visual changes, nausea, vomiting, diarrhea, constipation, dizziness, abdominal pain, skin rash,  fevers, chills, night sweats, weight loss, swollen lymph nodes, body aches, joint swelling, chest pain, shortness of breath, mood changes.   Objective  Blood pressure 118/76, pulse 89, height 5\' 8"  (1.727 m), weight 171 lb (77.6 kg), SpO2 98 %.   General: No apparent distress alert and oriented x3 mood and affect normal, dressed appropriately.  HEENT: Pupils equal, extraocular movements intact  Respiratory: Patient's speak in full sentences and does not appear short of breath  Cardiovascular: No lower extremity edema, non tender, no erythema  Gait normal with good balance and coordination.  MSK: Right shoulder exam does have a high riding humeral area.  Mild atrophy noted compared to the contralateral side.  Rotator cuff strength 4 out of 5 compared to the contralateral side.  Does have limited internal and external range of motion. Arthritic changes of multiple joints   Impression and Recommendations:     The above documentation has been reviewed and is accurate and complete Lyndal Pulley, DO

## 2021-05-26 ENCOUNTER — Other Ambulatory Visit: Payer: Self-pay

## 2021-05-26 ENCOUNTER — Ambulatory Visit (INDEPENDENT_AMBULATORY_CARE_PROVIDER_SITE_OTHER): Payer: HMO | Admitting: Family Medicine

## 2021-05-26 ENCOUNTER — Ambulatory Visit: Payer: Self-pay

## 2021-05-26 ENCOUNTER — Encounter: Payer: Self-pay | Admitting: Family Medicine

## 2021-05-26 ENCOUNTER — Ambulatory Visit (INDEPENDENT_AMBULATORY_CARE_PROVIDER_SITE_OTHER): Payer: HMO

## 2021-05-26 VITALS — BP 118/76 | HR 89 | Ht 68.0 in | Wt 171.0 lb

## 2021-05-26 DIAGNOSIS — M12811 Other specific arthropathies, not elsewhere classified, right shoulder: Secondary | ICD-10-CM | POA: Diagnosis not present

## 2021-05-26 DIAGNOSIS — M25511 Pain in right shoulder: Secondary | ICD-10-CM | POA: Diagnosis not present

## 2021-05-26 DIAGNOSIS — E538 Deficiency of other specified B group vitamins: Secondary | ICD-10-CM

## 2021-05-26 MED ORDER — CYANOCOBALAMIN 1000 MCG/ML IJ SOLN
1000.0000 ug | Freq: Once | INTRAMUSCULAR | Status: AC
  Administered 2021-05-26: 1000 ug via INTRAMUSCULAR

## 2021-05-26 NOTE — Progress Notes (Signed)
Pt given weekly W29 w/o any complications.

## 2021-05-26 NOTE — Patient Instructions (Signed)
Good to see you! Better than last time Continue exercises See me again 8-10 weeks if you need an injection Otherwise see me when you need me

## 2021-05-26 NOTE — Assessment & Plan Note (Signed)
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  Patient does not want any surgical intervention.  Does respond fairly well to the injection.  We will continue to monitor.  Patient can increase activity as tolerated.  Worsening pain I would consider injections again every 2 to 3 months if necessary.  Patient will follow up again in 2 to 3 months for further evaluation and treatment

## 2021-06-02 ENCOUNTER — Other Ambulatory Visit: Payer: Self-pay

## 2021-06-02 ENCOUNTER — Ambulatory Visit (INDEPENDENT_AMBULATORY_CARE_PROVIDER_SITE_OTHER): Payer: HMO

## 2021-06-02 DIAGNOSIS — E538 Deficiency of other specified B group vitamins: Secondary | ICD-10-CM

## 2021-06-02 MED ORDER — CYANOCOBALAMIN 1000 MCG/ML IJ SOLN
1000.0000 ug | Freq: Once | INTRAMUSCULAR | Status: AC
  Administered 2021-06-02: 1000 ug via INTRAMUSCULAR

## 2021-06-02 NOTE — Progress Notes (Signed)
B12 given w/o any complications. 

## 2021-06-08 ENCOUNTER — Other Ambulatory Visit: Payer: Self-pay | Admitting: Internal Medicine

## 2021-06-09 ENCOUNTER — Ambulatory Visit (INDEPENDENT_AMBULATORY_CARE_PROVIDER_SITE_OTHER): Payer: HMO

## 2021-06-09 ENCOUNTER — Other Ambulatory Visit: Payer: Self-pay

## 2021-06-09 DIAGNOSIS — E538 Deficiency of other specified B group vitamins: Secondary | ICD-10-CM

## 2021-06-09 MED ORDER — CYANOCOBALAMIN 1000 MCG/ML IJ SOLN
1000.0000 ug | Freq: Once | INTRAMUSCULAR | Status: AC
  Administered 2021-06-09: 1000 ug via INTRAMUSCULAR

## 2021-06-13 DIAGNOSIS — H26491 Other secondary cataract, right eye: Secondary | ICD-10-CM | POA: Diagnosis not present

## 2021-06-13 DIAGNOSIS — H16223 Keratoconjunctivitis sicca, not specified as Sjogren's, bilateral: Secondary | ICD-10-CM | POA: Diagnosis not present

## 2021-06-13 DIAGNOSIS — H18413 Arcus senilis, bilateral: Secondary | ICD-10-CM | POA: Diagnosis not present

## 2021-06-13 DIAGNOSIS — G245 Blepharospasm: Secondary | ICD-10-CM | POA: Diagnosis not present

## 2021-06-15 NOTE — Progress Notes (Signed)
B12 given. Please co-sign.  Charli Liberatore, CMA 

## 2021-06-16 ENCOUNTER — Ambulatory Visit: Payer: HMO

## 2021-06-16 ENCOUNTER — Ambulatory Visit (INDEPENDENT_AMBULATORY_CARE_PROVIDER_SITE_OTHER): Payer: HMO

## 2021-06-16 ENCOUNTER — Other Ambulatory Visit: Payer: Self-pay

## 2021-06-16 DIAGNOSIS — E538 Deficiency of other specified B group vitamins: Secondary | ICD-10-CM

## 2021-06-16 MED ORDER — CYANOCOBALAMIN 1000 MCG/ML IJ SOLN
1000.0000 ug | Freq: Once | INTRAMUSCULAR | Status: AC
  Administered 2021-06-16: 1000 ug via INTRAMUSCULAR

## 2021-06-16 NOTE — Progress Notes (Signed)
Pt given B12 w/o any complications. °

## 2021-06-22 ENCOUNTER — Ambulatory Visit: Payer: HMO | Admitting: Pulmonary Disease

## 2021-06-22 ENCOUNTER — Other Ambulatory Visit: Payer: Self-pay

## 2021-06-22 ENCOUNTER — Encounter: Payer: Self-pay | Admitting: Pulmonary Disease

## 2021-06-22 VITALS — BP 138/82 | HR 84 | Temp 98.4°F | Ht 68.0 in | Wt 175.0 lb

## 2021-06-22 DIAGNOSIS — R053 Chronic cough: Secondary | ICD-10-CM | POA: Insufficient documentation

## 2021-06-22 DIAGNOSIS — J3489 Other specified disorders of nose and nasal sinuses: Secondary | ICD-10-CM | POA: Diagnosis not present

## 2021-06-22 DIAGNOSIS — J8410 Pulmonary fibrosis, unspecified: Secondary | ICD-10-CM | POA: Diagnosis not present

## 2021-06-22 DIAGNOSIS — Z87891 Personal history of nicotine dependence: Secondary | ICD-10-CM | POA: Diagnosis not present

## 2021-06-22 DIAGNOSIS — J849 Interstitial pulmonary disease, unspecified: Secondary | ICD-10-CM

## 2021-06-22 NOTE — Progress Notes (Addendum)
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  He was sick for about a week.  Treated with outpatient monoclonal antibody therapy and did not require hospitalization His cough has worsened since his COVID-19 infection.  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  There was finding of crackles on auscultation and he was advised to follow-up with pulmonary.  Outpatient Encounter Medications as of 06/22/2021  Medication Sig   cholecalciferol (VITAMIN D3) 25 MCG (1000 UNIT) tablet Take 1,000 Units by mouth daily.   fluticasone (FLONASE) 50 MCG/ACT nasal spray Place 2 sprays into both nostrils daily.   glipiZIDE (GLUCOTROL) 5 MG tablet Take 1 tablet (5 mg total) by mouth daily before breakfast.   glucose blood (ONE TOUCH ULTRA TEST) test strip Test blood sugar once daily   levocetirizine (XYZAL) 5 MG tablet Take 1 tablet (5 mg total) by mouth every evening.   metFORMIN (GLUCOPHAGE-XR) 500 MG 24 hr tablet TAKE 2 TABLETS BY MOUTH TWICE A DAY   metoprolol tartrate (LOPRESSOR) 50 MG tablet TAKE 1 TABLET BY MOUTH 2 TIMES  DAILY.   nitroGLYCERIN (NITROSTAT) 0.4 MG SL tablet Place 1 tablet (0.4 mg total) under the tongue every 5 (five) minutes as needed for chest pain.   Omega-3 Fatty Acids (FISH OIL PO) Take 1 tablet by mouth daily.   omeprazole (PRILOSEC) 40 MG capsule Take 1 capsule (40 mg total) by mouth 2 (two) times daily.   ONETOUCH DELICA LANCETS 63W MISC TEST BLOOD SUGAR ONCE DAILY   rivaroxaban (XARELTO) 20 MG TABS tablet Take 1 tablet (20 mg total) by mouth daily with supper.   simvastatin (ZOCOR) 40 MG tablet TAKE 1 TABLET BY MOUTH EVERY EVENING.   triamcinolone cream (KENALOG) 0.1 %    No facility-administered encounter medications on file as of 06/22/2021.   Physical Exam: Blood pressure 138/82, pulse 84, temperature 98.4 F (36.9 C), temperature source Oral, height 5\' 8"  (1.727 m), weight 175 lb (79.4 kg), SpO2 96 %. Gen:      No acute distress HEENT:  EOMI, sclera anicteric Neck:     No masses; no thyromegaly Lungs:    Bibasal crackles CV:         Regular rate and rhythm; no murmurs Abd:      + bowel sounds; soft, non-tender; no palpable masses, no distension Ext:    No edema; adequate peripheral perfusion Skin:      Warm and dry; no rash Neuro: alert and oriented x 3 Psych: normal mood and affect   Data Reviewed: Imaging Chest x-ray 12/04/16-stable interstitial opacities CT abdomen 11/20/13-mild reticular opacities at the bases CT high-resolution 01/02/17- subpleural reticulation, mild  traction bronchiectasis, possible early honeycombing with basal gradient. 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  Slightly worse compared to 2018 High-res CT 03/21/2019-probable UIP High-res CT 03/30/2020-unchanged probable UIP fibrosis High-res CT 09/02/2020- unchanged probable UIP fibrosis I have reviewed the images personally  PFTs  03/20/17- FVC 2.74 (72%], FEV1 2.38 (87%), F/F 87, TLC 62%, DLCO 15.89 (56%)  01/15/2018-FVC 2.74 [73%], FEV1 2.33 [86%], F/F 85, TLC 71%, DLCO 13.13  (46%)  07/23/2018-FVC 2.51 [69%), FEV1 2.10 [78%), DLCO 15.16 (53%) Mild restriction, moderate-severe diffusion defect.  04/02/2019 FVC 2.70 [72%], FEV1 2.18 [82%], F/F 81, DLCO 13.8 [60%] Moderate diffusion defect  08/31/2020 FVC 2.52 [65%], FEV1 2.25 [82%], F/F 89, TLC 4.64 [69%], DLCO 20.59 [88%] Mild restriction.  Improvement in diffusion capacity  FENO 12/18/16- 12  Labs ILD serologies 01/09/17- all negative except for mild elevation in SCL 75.  Assessment:  Pulmonary fibrosis, asbestos exposure. 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 There is a possibility that this could be idiopathic pulmonary fibrosis but most CT scans and PFTs show stability.  We had several discussions with Todd [name] and his wife regarding anti-fibrotic therapy.  Given stability of pulmonary fibrosis and lack of symptoms we have decided to hold off treatment.  They are wary of possible side effects.  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  He had mild symptoms which did not require hospitalization Follow-up CT and PFTs reviewed which thankfully did not show any worsening of fibrosis or lung capacity.  Actually his diffusion capacity has improved compared to prior tests  Chronic cough, hoarseness Continue Prilosec for GERD.   Health maintenance 01/15/2015-Prevnar 13 05/05/2010-Pneumovax  Plan/Recommendations: Follow-up high-resolution CT and PFTs  Marshell Garfinkel MD [facility] 06/22/2021, 4:19 PM  CC: Binnie Rail, MD

## 2021-06-22 NOTE — Patient Instructions (Signed)
We will get a high-res CT and PFTs for reevaluation of your lungs Follow-up in clinic in 1 to 2 months.

## 2021-06-23 ENCOUNTER — Ambulatory Visit (INDEPENDENT_AMBULATORY_CARE_PROVIDER_SITE_OTHER): Payer: HMO

## 2021-06-23 DIAGNOSIS — E538 Deficiency of other specified B group vitamins: Secondary | ICD-10-CM | POA: Diagnosis not present

## 2021-06-23 MED ORDER — CYANOCOBALAMIN 1000 MCG/ML IJ SOLN
1000.0000 ug | Freq: Once | INTRAMUSCULAR | Status: AC
  Administered 2021-06-23: 1000 ug via INTRAMUSCULAR

## 2021-06-23 NOTE — Progress Notes (Signed)
Weekly B12 given w/o any complications.

## 2021-06-29 ENCOUNTER — Ambulatory Visit (INDEPENDENT_AMBULATORY_CARE_PROVIDER_SITE_OTHER): Payer: HMO

## 2021-06-29 ENCOUNTER — Other Ambulatory Visit: Payer: Self-pay

## 2021-06-29 DIAGNOSIS — E538 Deficiency of other specified B group vitamins: Secondary | ICD-10-CM

## 2021-06-29 MED ORDER — CYANOCOBALAMIN 1000 MCG/ML IJ SOLN
1000.0000 ug | Freq: Once | INTRAMUSCULAR | Status: AC
  Administered 2021-06-29: 1000 ug via INTRAMUSCULAR

## 2021-06-29 NOTE — Progress Notes (Signed)
B12 administered via IM R Deltoid tolerated well.

## 2021-06-30 ENCOUNTER — Ambulatory Visit
Admission: RE | Admit: 2021-06-30 | Discharge: 2021-06-30 | Disposition: A | Discharge status: Home / Self Care | Payer: HMO | Source: Ambulatory Visit | Attending: Pulmonary Disease | Admitting: Pulmonary Disease

## 2021-06-30 DIAGNOSIS — J8410 Pulmonary fibrosis, unspecified: Secondary | ICD-10-CM | POA: Diagnosis not present

## 2021-06-30 DIAGNOSIS — J479 Bronchiectasis, uncomplicated: Secondary | ICD-10-CM | POA: Diagnosis not present

## 2021-06-30 DIAGNOSIS — J849 Interstitial pulmonary disease, unspecified: Secondary | ICD-10-CM

## 2021-06-30 DIAGNOSIS — I2510 Atherosclerotic heart disease of native coronary artery without angina pectoris: Secondary | ICD-10-CM | POA: Diagnosis not present

## 2021-07-04 ENCOUNTER — Telehealth: Payer: Self-pay | Admitting: Pharmacist

## 2021-07-04 NOTE — Progress Notes (Signed)
    Chronic Care Management Pharmacy Assistant   Name: LECIL TAPP  MRN: [id number] DOB: 11-07-1940  Patient is currently enrolled in [employer] patient assistance program for the medication Rybelsus until date: 08/20/21 Patient wife stated that he is no longer on Rybelsus and would not need to re-enroll in patient assistance for the next calendar year.  [facility] Pharmacist Assistant [phone]

## 2021-07-06 ENCOUNTER — Other Ambulatory Visit: Payer: Self-pay | Admitting: Internal Medicine

## 2021-07-07 ENCOUNTER — Ambulatory Visit (INDEPENDENT_AMBULATORY_CARE_PROVIDER_SITE_OTHER): Payer: HMO

## 2021-07-07 ENCOUNTER — Other Ambulatory Visit: Payer: Self-pay

## 2021-07-07 ENCOUNTER — Telehealth: Payer: Self-pay | Admitting: Pulmonary Disease

## 2021-07-07 DIAGNOSIS — E538 Deficiency of other specified B group vitamins: Secondary | ICD-10-CM

## 2021-07-07 MED ORDER — CYANOCOBALAMIN 1000 MCG/ML IJ SOLN
1000.0000 ug | Freq: Once | INTRAMUSCULAR | Status: AC
  Administered 2021-07-07: 1000 ug via INTRAMUSCULAR

## 2021-07-07 NOTE — Telephone Encounter (Signed)
PM please advise on the results of the CT scan.  thanks

## 2021-07-07 NOTE — Telephone Encounter (Signed)
Called both numbers that were provided and LMTCB on both since no answer.

## 2021-07-07 NOTE — Progress Notes (Signed)
B12 given and tolerated well °

## 2021-07-07 NOTE — Telephone Encounter (Signed)
I called but got a voicemail and left message requesting callback  CT scan does show mild worsening of the scarring.  Make an appointment at next available to discuss CT scan results and next steps

## 2021-07-07 NOTE — Telephone Encounter (Signed)
Called and spoke with patient's wife. She verbalized understanding.   Nothing further needed at time of call.  

## 2021-07-13 ENCOUNTER — Other Ambulatory Visit: Payer: Self-pay

## 2021-07-13 ENCOUNTER — Ambulatory Visit (INDEPENDENT_AMBULATORY_CARE_PROVIDER_SITE_OTHER): Payer: HMO

## 2021-07-13 DIAGNOSIS — E538 Deficiency of other specified B group vitamins: Secondary | ICD-10-CM | POA: Diagnosis not present

## 2021-07-13 MED ORDER — CYANOCOBALAMIN 1000 MCG/ML IJ SOLN
1000.0000 ug | Freq: Once | INTRAMUSCULAR | Status: AC
  Administered 2021-07-13: 1000 ug via INTRAMUSCULAR

## 2021-07-13 NOTE — Progress Notes (Signed)
Pt was given B12 w/o any complications. 

## 2021-07-19 ENCOUNTER — Encounter: Payer: Self-pay | Admitting: Pulmonary Disease

## 2021-07-19 ENCOUNTER — Other Ambulatory Visit: Payer: Self-pay

## 2021-07-19 ENCOUNTER — Ambulatory Visit (INDEPENDENT_AMBULATORY_CARE_PROVIDER_SITE_OTHER): Payer: HMO | Admitting: Pulmonary Disease

## 2021-07-19 VITALS — BP 124/68 | HR 75 | Temp 97.9°F | Ht 68.0 in | Wt 175.8 lb

## 2021-07-19 DIAGNOSIS — J849 Interstitial pulmonary disease, unspecified: Secondary | ICD-10-CM | POA: Diagnosis not present

## 2021-07-19 DIAGNOSIS — J8410 Pulmonary fibrosis, unspecified: Secondary | ICD-10-CM

## 2021-07-19 NOTE — Progress Notes (Signed)
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  He was sick for about a week.  Treated with outpatient monoclonal antibody therapy and did not require hospitalization His cough has worsened since his COVID-19 infection.  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  Started on Tessalon which did not help  Here for review of follow-up CT  Outpatient Encounter Medications as of 07/19/2021  Medication Sig   cholecalciferol (VITAMIN D3) 25 MCG (1000 UNIT) tablet Take 1,000 Units by mouth daily.   ELDERBERRY PO Take by mouth daily.   fluticasone (FLONASE) 50 MCG/ACT nasal spray Place 2 sprays into both nostrils daily.   glipiZIDE (GLUCOTROL) 5 MG tablet Take 1 tablet (5 mg total) by mouth daily before breakfast.   levocetirizine (XYZAL) 5 MG tablet Take 1 tablet (5 mg total) by mouth every evening.   metFORMIN (GLUCOPHAGE-XR) 500 MG 24 hr tablet TAKE 2 TABLETS BY MOUTH TWICE A DAY   metoprolol tartrate (LOPRESSOR) 50 MG tablet TAKE 1 TABLET BY MOUTH 2 TIMES DAILY.   nitroGLYCERIN (NITROSTAT) 0.4 MG SL tablet Place 1 tablet (0.4 mg total) under the tongue every  5 (five) minutes as needed for chest pain.   Omega-3 Fatty Acids (FISH OIL PO) Take 1 tablet by mouth daily.   omeprazole (PRILOSEC) 40 MG capsule Take 1 capsule (40 mg total) by mouth 2 (two) times daily.   ONETOUCH DELICA LANCETS 09F MISC TEST BLOOD SUGAR ONCE DAILY   ONETOUCH VERIO test strip USE UP TO 4 TIMES A DAY AS DIRECTED   rivaroxaban (XARELTO) 20 MG TABS tablet Take 1 tablet (20 mg total) by mouth daily with supper.   simvastatin (ZOCOR) 40 MG tablet TAKE 1 TABLET BY MOUTH EVERY EVENING.   triamcinolone cream (KENALOG) 0.1 %    No facility-administered encounter medications on file as of 07/19/2021.   Physical Exam: Blood pressure 124/68, pulse 75, temperature 97.9 F (36.6 C), temperature source Oral, height 5\' 8"  (1.727 m), weight 175 lb 12.8 oz (79.7 kg), SpO2 99 %. Gen:      No acute distress HEENT:  EOMI, sclera anicteric Neck:     No masses; no thyromegaly Lungs:    Clear to auscultation bilaterally; normal respiratory effort CV:         Regular rate and rhythm; no murmurs Abd:      + bowel sounds; soft, non-tender; no palpable masses, no distension Ext:    No edema; adequate peripheral perfusion Skin:      Warm and dry; no rash Neuro: alert and oriented x 3 Psych: normal mood and affect   Data Reviewed: Imaging Chest x-ray 12/04/16-stable interstitial opacities CT abdomen 11/20/13-mild reticular opacities at the bases CT high-resolution  01/02/17- subpleural reticulation, mild traction bronchiectasis, possible early honeycombing with basal gradient. 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  Slightly worse compared to 2018 High-res CT 03/21/2019-probable UIP High-res CT 03/30/2020-unchanged probable UIP fibrosis High-res CT 09/02/2020- unchanged probable UIP fibrosis High-res CT 06/30/2021-slight worsening of fibrosis and probable UIP pattern I have reviewed the images personally  PFTs  03/20/17- FVC 2.74 (72%], FEV1 2.38 (87%), F/F 87, TLC 62%, DLCO 15.89  (56%)  01/15/2018-FVC 2.74 [73%], FEV1 2.33 [86%], F/F 85, TLC 71%, DLCO 13.13 (46%)  07/23/2018-FVC 2.51 [69%), FEV1 2.10 [78%), DLCO 15.16 (53%) Mild restriction, moderate-severe diffusion defect.  04/02/2019 FVC 2.70 [72%], FEV1 2.18 [82%], F/F 81, DLCO 13.8 [60%] Moderate diffusion defect  08/31/2020 FVC 2.52 [65%], FEV1 2.25 [82%], F/F 89, TLC 4.64 [69%], DLCO 20.59 [88%] Mild restriction.  Improvement in diffusion capacity  FENO 12/18/16- 12  Labs ILD serologies 01/09/17- all negative except for mild elevation in SCL 75.  Assessment:  Pulmonary fibrosis, asbestos exposure. 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  He had mild symptoms which did not require hospitalization  Chronic cough, hoarseness Continue Prilosec for GERD.  Continue Tessalon  Health maintenance 01/15/2015-Prevnar 13 05/05/2010-Pneumovax  Plan/Recommendations: Initiate Esbriet PFTs and follow-up in 1 month  Marshell Garfinkel MD [facility] 07/19/2021, 10:25 AM  CC: Binnie Rail, MD

## 2021-07-19 NOTE — Patient Instructions (Signed)
I have reviewed your CT scan which does show worsening scarring in the lung Based on our discussion today we will start you on a medication called Esbriet Follow-up in 1 month after lung function test

## 2021-07-20 ENCOUNTER — Other Ambulatory Visit (HOSPITAL_COMMUNITY): Payer: Self-pay

## 2021-07-20 ENCOUNTER — Telehealth: Payer: Self-pay

## 2021-07-20 NOTE — Telephone Encounter (Signed)
Submitted Patient Assistance Application to [employer] for ESBRIET along with provider portion, PA and income documents. Will update patient when we receive a response.  Fax# [phone] Phone# [phone] 

## 2021-07-20 NOTE — Telephone Encounter (Signed)
Received notification from [facility] regarding a prior authorization for ESBRIET. Authorization has been APPROVED from 07/20/2021 to 07/20/2022.   Per test claim, copay for 30 days supply is $1,632.75  Patient can fill through [facility]: [phone]   Authorization #  [id number]  Will complete PAP documents and await PA approval letter.

## 2021-07-20 NOTE — Telephone Encounter (Signed)
Patient Advocate Encounter   Received notification from the proivder that prior authorization for Esbriet 267mg  tabs is required by his/her insurance [employer] D.   PA submitted on 07/20/21  Key#:  [id number]  Status is pending    [facility] will continue to follow:  Patient Advocate Fax:  [phone]

## 2021-07-26 ENCOUNTER — Telehealth: Payer: Self-pay | Admitting: *Deleted

## 2021-07-26 NOTE — Chronic Care Management (AMB) (Signed)
  Chronic Care Management   Note  07/26/2021 Name: EULON ALLNUTT MRN: [id number] DOB: [birth year]  Elyn Aquas Mattila is a 80 y.o. year old male who is a primary care patient of Quay Burow, [name], MD and is actively engaged with the care management team. I reached out to [facility] by phone today to assist with scheduling an initial visit with the RN Case Manager  Follow up plan: Telephone appointment with care management team member scheduled for:07/28/21  [facility]  Direct Dial: [phone]

## 2021-07-28 ENCOUNTER — Ambulatory Visit: Payer: HMO | Admitting: *Deleted

## 2021-07-28 ENCOUNTER — Other Ambulatory Visit: Payer: Self-pay | Admitting: Internal Medicine

## 2021-07-28 DIAGNOSIS — J8410 Pulmonary fibrosis, unspecified: Secondary | ICD-10-CM

## 2021-07-28 DIAGNOSIS — E1151 Type 2 diabetes mellitus with diabetic peripheral angiopathy without gangrene: Secondary | ICD-10-CM

## 2021-07-28 NOTE — Chronic Care Management (AMB) (Signed)
Chronic Care Management   CCM RN Visit Note  07/28/2021 Name: Todd [name] MRN: [id number] DOB: [birth year]  Subjective: Todd [name] is a 80 y.o. year old male who is a primary care patient of Burns, [name], MD. The care management team was consulted for assistance with disease management and care coordination needs.    Engaged with patient and his spouse Todd [name], on [facility] DPR by telephone for initial visit in response to provider referral for case management and/or care coordination services.   Consent to Services:  The patient was given information about Chronic Care Management services, agreed to services, and gave verbal consent 06/21/20 prior to initiation of services.  Please see initial visit note for detailed documentation.   Patient agreed to services and verbal consent obtained.   Assessment: Review of patient past medical history, allergies, medications, health status, including review of consultants reports, laboratory and other test data, was performed as part of comprehensive evaluation and provision of chronic care management services.   SDOH (Social Determinants of Health) assessments and interventions performed:  SDOH Interventions    Flowsheet Row Most Recent Value  SDOH Interventions   Food Insecurity Interventions Intervention Not Indicated  Housing Interventions Intervention Not Indicated  [Single Family home x 47 years]  Transportation Interventions Intervention Not Indicated  [Patient drives self]       CCM Care Plan Allergies  Allergen Reactions   Amlodipine Besylate     Rash Because of a history of documented adverse serious drug reaction;Medi Alert bracelet  is recommended   Ivp Dye [Iodinated Diagnostic Agents]     Rash Because of a history of documented adverse serious drug reaction;Medi Alert bracelet  is recommended    Outpatient Encounter Medications as of 07/28/2021  Medication Sig   cholecalciferol (VITAMIN D3) 25 MCG (1000 UNIT) tablet Take 1,000  Units by mouth daily.   ELDERBERRY PO Take by mouth daily.   fluticasone (FLONASE) 50 MCG/ACT nasal spray Place 2 sprays into both nostrils daily.   glipiZIDE (GLUCOTROL) 5 MG tablet Take 1 tablet (5 mg total) by mouth daily before breakfast.   levocetirizine (XYZAL) 5 MG tablet Take 1 tablet (5 mg total) by mouth every evening.   metFORMIN (GLUCOPHAGE-XR) 500 MG 24 hr tablet TAKE 2 TABLETS BY MOUTH TWICE A DAY   metoprolol tartrate (LOPRESSOR) 50 MG tablet TAKE 1 TABLET BY MOUTH 2 TIMES DAILY.   nitroGLYCERIN (NITROSTAT) 0.4 MG SL tablet Place 1 tablet (0.4 mg total) under the tongue every 5 (five) minutes as needed for chest pain.   Omega-3 Fatty Acids (FISH OIL PO) Take 1 tablet by mouth daily.   omeprazole (PRILOSEC) 40 MG capsule Take 1 capsule (40 mg total) by mouth 2 (two) times daily.   ONETOUCH DELICA LANCETS 09O MISC TEST BLOOD SUGAR ONCE DAILY   ONETOUCH VERIO test strip USE UP TO 4 TIMES A DAY AS DIRECTED   rivaroxaban (XARELTO) 20 MG TABS tablet Take 1 tablet (20 mg total) by mouth daily with supper.   simvastatin (ZOCOR) 40 MG tablet TAKE 1 TABLET BY MOUTH EVERY EVENING.   triamcinolone cream (KENALOG) 0.1 %    [DISCONTINUED] metFORMIN (GLUCOPHAGE-XR) 500 MG 24 hr tablet TAKE 2 TABLETS BY MOUTH TWICE A DAY   No facility-administered encounter medications on file as of 07/28/2021.   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   Hemorrhoids, internal, with bleeding 01/15/2015   Bursitis of left shoulder 01/14/2015   Type 2 diabetes, controlled, with peripheral circulatory disorder ([place]) 12/05/2013   PVD (peripheral vascular disease) ([place]) 11/12/2013   Atrial fibrillation ([place]) 11/03/2013   Carotid artery disease ([place]) 12/20/2009   OVERWEIGHT/OBESITY 11/17/2008   Hyperlipidemia 07/20/2008   Essential hypertension 07/20/2008   Coronary atherosclerosis 07/20/2008   ALLERGIC RHINITIS 07/20/2008   NEPHROLITHIASIS, HX OF 07/20/2008   Conditions to be addressed/monitored:  COPD/ pulmonary fibrosis and DMII  Care Plan : RN Care Manager Plan of Care  Updates made by Knox Royalty, RN since 07/28/2021 12:00 AM     Problem: Chronic Disease Management Needs   Priority: High     Long-Range Goal: Development of Plan of Care for long term chronic disease management   Start Date: 07/28/2021  Expected End Date: 07/28/2022  Priority: High  Note:   Current Barriers:  Chronic Disease Management support and education needs related to 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   Interventions: 1:1 collaboration with primary care provider regarding development and update of comprehensive plan of care as evidenced by provider attestation and co-signature Inter-disciplinary care team collaboration (see longitudinal plan of care) Evaluation of current treatment plan related to  self management and patient's adherence to plan as established by  provider  COPD/ Pulmonary Fibrosis: (Status: New goal.) Long Term Goal  Reviewed general medications with patient, including use of prescribed maintenance and rescue inhalers, and provided instruction on medication management and the importance of adherence Advised patient to self assesses COPD action plan zone and make appointment with provider if in the yellow zone for 48 hours without improvement Provided education about and advised patient to utilize infection prevention strategies to reduce risk of respiratory infection Discussed the importance of adequate rest and management of fatigue with COPD Assessed social determinant of health barriers Assessed pain levels; discussed pain management around reported chest discomfort due to chronic cough post- COVID infection; patient reports low level, manageable pain and discomfort today Confirmed no new/ recent falls: patient does not use assistive devices Confirmed no medication concerns today: patient currently in donut hole, but has applied for PAP with cost of Xarelto- did not qualify for PAP; wife assists patient in filling of weekly pill box, patient otherwise is independent in medication management and reports adherence to taking all medications as prescribed  Diabetes:  (Status: New goal.) Long Term Goal   Lab Results  Component Value Date   HGBA1C 7.2 (H) 05/23/2021  Assessed patient's understanding of A1c goal: <7% Provided education to patient about basic DM disease process; Reviewed prescribed diet with patient  low carbohydrate, low sugar, low salt; Counseled on importance of regular laboratory monitoring as prescribed;        Discussed plans with patient for ongoing care management follow up and provided patient with direct contact information for care management team;      Reviewed scheduled/upcoming provider appointments including: 08/02/21- AWE; 08/03/21- PCP; 08/24/21- pulmonary provider/ PFT testing;  08/25/21- cardiology provider;  08/30/21 neurology/ new patient appointment- vestibular evaluation; ;         Review of patient status, including review of consultants reports, relevant laboratory and other test results, and medications completed;       Confirmed patient monitors blood sugars at home 3 times per week fasting; reports general ranges consistently between 120-140 Assessed patient's understanding of meaning/ significance of A1-C values: fair baseline understanding of same- reviewed individual historical A1-C trends and provided education around correlation of A1-C value to blood sugar levels at home over 3 months Provided printed educational material and Living Well with Diabetes booklet; encouraged patient's review of same  Patient Goals/Self-Care Activities: As evidenced by review of EHR, collaboration with care team, and patient reporting during CCM RN CM outreach, Patient Devanta will: Take medications as prescribed Attend all scheduled provider appointments Call pharmacy for medication refills Call provider office for new concerns or questions Continue to check fasting (first thing in the morning, before eating) blood sugars at home every other day Continue to follow heart healthy, low salt, carbohydrate-modified, low sugar diet Keep up the great work preventing falls Review enclosed educational material       Plan: Telephone follow up appointment with care management team member scheduled for:  Friday, September 02, 2020 at 2:15 pm The patient has been provided with contact information for the care management team and has been advised to call with any health related questions or concerns  Oneta Rack, RN, BSN, [place] [phone]: direct office

## 2021-07-28 NOTE — Patient Instructions (Signed)
Visit Information   Todd [name] and Todd [name], thank you for taking time to talk with me today. Please don't hesitate to contact me if I can be of assistance to you before our next scheduled telephone appointment.  Below are the goals we discussed today:  Patient Self-Care Activities: Patient Todd [name] will: Take medications as prescribed Attend all scheduled provider appointments Call pharmacy for medication refills Call provider office for new concerns or questions Continue to check fasting (first thing in the morning, before eating) blood sugars at home every other day Continue to follow heart healthy, low salt, carbohydrate-modified, low sugar diet Keep up the great work preventing falls Review enclosed educational material   Our next scheduled telephone follow up visit/ appointment with care management team member is scheduled on:  Friday, September 02 2021 at 2:15 pm  If you need to cancel or re-schedule our visit, please call [phone] and our care guide team will be happy to assist you.   I look forward to hearing about your progress.   Oneta Rack, RN, BSN, [name] [phone]: direct office  If you are experiencing a Mental Health or [address] or need someone to talk to, please  call the Suicide and Crisis Lifeline: 988 call the [place] National Suicide Prevention Lifeline: 706-753-3024 or TTY: 516-005-7409 TTY 604-342-2146) to talk to a trained counselor call 1-800-273-TALK (toll free, 24 hour hotline) go to [facility] [address] [place] [phone]) call 911   Diabetes Mellitus and Standards of [place] with and managing diabetes (diabetes mellitus) can be complicated. Your diabetes treatment may be managed by a team of health care providers, including: A physician who specializes in diabetes (endocrinologist). You might also have visits with a  nurse practitioner or physician assistant. Nurses. A registered dietitian. A certified diabetes care and education specialist. An exercise specialist. A pharmacist. An eye doctor. A foot specialist (podiatrist). A dental care provider. A primary care provider. A mental health care provider. How to manage your diabetes You can do many things to successfully manage your diabetes. Your health care providers will follow guidelines to help you get the best quality of care. Here are general guidelines for your diabetes management plan. Your health care providers may give you more specific instructions. Physical exams When you are diagnosed with diabetes, and each year after that, your health care provider will ask about your medical and family history. You will have a physical exam, which may include: Measuring your height, weight, and body mass index (BMI). Checking your blood pressure. This will be done at every routine medical visit. Your target blood pressure may vary depending on your medical conditions, your age, and other factors. A thyroid exam. A skin exam. Screening for nerve damage (peripheral neuropathy). This may include checking the pulse in your legs and feet and the level of sensation in your hands and feet. A foot exam to inspect the structure and skin of your feet, including checking for cuts, bruises, redness, blisters, sores, or other problems. Screening for blood vessel (vascular) problems. This may include checking the pulse in your legs and feet and checking your temperature. Blood tests Depending on your treatment plan and your personal needs, you may have the following tests: Hemoglobin A1C (HbA1C). This test provides information about blood sugar (glucose) control over the previous 2-3 months. It is used to adjust your treatment plan, if needed. This test will be done:  At least 2 times a year, if you are meeting your treatment goals. 4 times a year, if you are not  meeting your treatment goals or if your goals have changed. Lipid testing, including total cholesterol, LDL and HDL cholesterol, and triglyceride levels. The goal for LDL is less than 100 mg/dL (5.5 mmol/L). If you are at high risk for complications, the goal is less than 70 mg/dL (3.9 mmol/L). The goal for HDL is 40 mg/dL (2.2 mmol/L) or higher for men, and 50 mg/dL (2.8 mmol/L) or higher for women. An HDL cholesterol of 60 mg/dL (3.3 mmol/L) or higher gives some protection against heart disease. The goal for triglycerides is less than 150 mg/dL (8.3 mmol/L). Liver function tests. Kidney function tests. Thyroid function tests.  Dental and eye exams  Visit your dentist two times a year. If you have type 1 diabetes, your health care provider may recommend an eye exam within 5 years after you are diagnosed, and then once a year after your first exam. For children with type 1 diabetes, the health care provider may recommend an eye exam when your child is age 10 or older and has had diabetes for 3-5 years. After the first exam, your child should get an eye exam once a year. If you have type 2 diabetes, your health care provider may recommend an eye exam as soon as you are diagnosed, and then every 1-2 years after your first exam. Immunizations A yearly flu (influenza) vaccine is recommended annually for everyone 6 months or older. This is especially important if you have diabetes. The pneumonia (pneumococcal) vaccine is recommended for everyone 2 years or older who has diabetes. If you are age 86 or older, you may get the pneumonia vaccine as a series of two separate shots. The hepatitis B vaccine is recommended for adults shortly after being diagnosed with diabetes. Adults and children with diabetes should receive all other vaccines according to age-specific recommendations from the Centers for Disease Control and Prevention (CDC). Mental and emotional health Screening for symptoms of eating  disorders, anxiety, and depression is recommended at the time of diagnosis and after as needed. If your screening shows that you have symptoms, you may need more evaluation. You may work with a mental health care provider. Follow these instructions at home: Treatment plan You will monitor your blood glucose levels and may give yourself insulin. Your treatment plan will be reviewed at every medical visit. You and your health care provider will discuss: How you are taking your medicines, including insulin. Any side effects you have. Your blood glucose level target goals. How often you monitor your blood glucose level. Lifestyle habits, such as activity level and tobacco, alcohol, and substance use. Education Your health care provider will assess how well you are monitoring your blood glucose levels and whether you are taking your insulin and medicines correctly. He or she may refer you to: A certified diabetes care and education specialist to manage your diabetes throughout your life, starting at diagnosis. A registered dietitian who can create and review your personal nutrition plan. An exercise specialist who can discuss your activity level and exercise plan. General instructions Take over-the-counter and prescription medicines only as told by your health care provider. Keep all follow-up visits. This is important. Where to find support There are many diabetes support networks, including: American Diabetes Association (ADA): diabetes.org Defeat Diabetes Foundation: defeatdiabetes.org Where to find more information American Diabetes Association (ADA): www.diabetes.org Association of Diabetes Care & Education Specialists (ADCES):  diabeteseducator.org International Diabetes Federation (IDF): https://www.munoz-bell.org/ Summary Managing diabetes (diabetes mellitus) can be complicated. Your diabetes treatment may be managed by a team of health care providers. Your health care providers follow guidelines to help  you get the best quality care. You should have physical exams, blood tests, blood pressure monitoring, immunizations, and screening tests regularly. Stay updated on how to manage your diabetes. Your health care providers may also give you more specific instructions based on your individual health. This information is not intended to replace advice given to you by your health care provider. Make sure you discuss any questions you have with your health care provider. 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   Interventions: 1:1 collaboration with primary care provider regarding development and update of comprehensive plan of care as evidenced by provider attestation and co-signature Inter-disciplinary care team collaboration (see longitudinal plan of care) Evaluation of current treatment plan related to  self management and patient's adherence to plan as established by provider  COPD/ Pulmonary Fibrosis: (Status: New goal.) Long Term Goal  Reviewed general medications with patient, including use of prescribed  maintenance and rescue inhalers, and provided instruction on medication management and the importance of adherence Advised patient to self assesses COPD action plan zone and make appointment with provider if in the yellow zone for 48 hours without improvement Provided education about and advised patient to utilize infection prevention strategies to reduce risk of respiratory infection Discussed the importance of adequate rest and management of fatigue with COPD Assessed social determinant of health barriers Assessed pain levels; discussed pain management around reported chest discomfort due to chronic cough post- COVID infection; patient reports low level, manageable pain and discomfort today Confirmed no new/ recent falls: patient does not use assistive devices Confirmed no medication concerns today: patient currently in donut hole, but has applied for PAP with cost of Xarelto- did not qualify for PAP; wife assists patient in filling of weekly pill box, patient otherwise is independent in medication management and reports adherence to taking all medications as prescribed  Diabetes:  (Status: New goal.) Long Term Goal   Lab Results  Component Value Date   HGBA1C 7.2 (H) 05/23/2021  Assessed patient's understanding of A1c goal: <7% Provided education to patient about basic DM disease process; Reviewed prescribed diet with patient low carbohydrate, low sugar, low salt; Counseled on importance of regular laboratory monitoring as prescribed;        Discussed plans with patient for ongoing care management follow up and provided patient with direct contact information for care management team;      Reviewed scheduled/upcoming  provider appointments including: 08/02/21- AWE; 08/03/21- PCP; 08/24/21- pulmonary provider/ PFT testing;  08/25/21- cardiology provider; 08/30/21 neurology/ new patient appointment- vestibular evaluation; ;         Review of patient status, including review of consultants reports,  relevant laboratory and other test results, and medications completed;       Confirmed patient monitors blood sugars at home 3 times per week fasting; reports general ranges consistently between 120-140 Assessed patient's understanding of meaning/ significance of A1-C values: fair baseline understanding of same- reviewed individual historical A1-C trends and provided education around correlation of A1-C value to blood sugar levels at home over 3 months Provided printed educational material and Living Well with Diabetes booklet; encouraged patient's review of same  Patient Goals/Self-Care Activities: As evidenced by review of EHR, collaboration with care team, and patient reporting during CCM RN CM outreach, Patient Todd will: Take medications as prescribed Attend all scheduled provider appointments Call pharmacy for medication refills Call provider office for new concerns or questions Continue to check fasting (first thing in the morning, before eating) blood sugars at home every other day Continue to follow heart healthy, low salt, carbohydrate-modified, low sugar diet Keep up the great work preventing falls Review enclosed educational material       Consent to CCM Services: Todd [name] was given information about Chronic Care Management services 06/21/20 including:  CCM service includes personalized support from designated clinical staff supervised by his physician, including individualized plan of care and coordination with other care providers 24/7 contact phone numbers for assistance for urgent and routine care needs. Service will only be billed when office clinical staff spend 20 minutes or more in a month to coordinate care. Only one practitioner may furnish and bill the service in a calendar month. The patient may stop CCM services at any time (effective at the end of the month) by phone call to the office staff. The patient will be responsible for cost sharing (co-pay) of up to 20%  of the service fee (after annual deductible is met).  Patient agreed to services and verbal consent obtained.   The patient/ his spouse verbalized understanding of instructions, educational materials, and care plan provided today and agreed to receive a mailed copy of patient instructions, educational materials, and care plan.  Telephone follow up appointment with care management team member scheduled for:  Friday, September 02 2021 at 2:15 pm The patient has been provided with contact information for the care management team and has been advised to call with any health related questions or concerns

## 2021-08-01 ENCOUNTER — Other Ambulatory Visit: Payer: Self-pay | Admitting: Cardiology

## 2021-08-01 NOTE — Telephone Encounter (Signed)
Xarelto 20 mg refill request received. Pt is 80 years old, weight- 79.7 kg, Crea- 0.8 on 05/23/21, last seen by Dr. Percival Spanish on 09/02/20, Diagnosis- PAF, CrCl- 85.79; Dose is appropriate based on dosing criteria. Will send in refill to requested pharmacy.

## 2021-08-01 NOTE — Telephone Encounter (Signed)
Received a fax from [place] regarding an approval for [place] patient assistance from 08/01/21 until patient no longer meets eligibility for program . He will need to complete onboarding call with [facility] PAP counselor first. [facility] will reach out to him 5-7 business days to schedule first shipment  [place] number: [phone] [facility]: [phone]  Knox Saliva, PharmD, MPH, BCPS Clinical Pharmacist (Rheumatology and Pulmonology)

## 2021-08-02 ENCOUNTER — Other Ambulatory Visit: Payer: Self-pay

## 2021-08-02 ENCOUNTER — Encounter: Payer: Self-pay | Admitting: Internal Medicine

## 2021-08-02 ENCOUNTER — Ambulatory Visit (INDEPENDENT_AMBULATORY_CARE_PROVIDER_SITE_OTHER): Payer: HMO

## 2021-08-02 DIAGNOSIS — Z0000 Encounter for general adult medical examination without abnormal findings: Secondary | ICD-10-CM

## 2021-08-02 NOTE — Progress Notes (Signed)
I connected with Todd [name] today by telephone and verified that I am speaking with the correct person using two identifiers. Location patient: home Location provider: work Persons participating in the virtual visit: patient, Todd [name] (wife) and provider.   I discussed the limitations, risks, security and privacy concerns of performing an evaluation and management service by telephone and the availability of in person appointments. I also discussed with the patient that there may be a patient responsible charge related to this service. The patient expressed understanding and verbally consented to this telephonic visit.    Interactive audio and video telecommunications were attempted between this provider and patient, however failed, due to patient having technical difficulties OR patient did not have access to video capability.  We continued and completed visit with audio only.  Some vital signs may be absent or patient reported.   Time Spent with patient on telephone encounter: 40 minutes  Subjective:   Todd [name] is a 80 y.o. male who presents for Medicare Annual/Subsequent preventive examination.  Review of Systems     Cardiac Risk Factors include: advanced age (>73mn, >>10women);family history of premature cardiovascular disease;diabetes mellitus;dyslipidemia;hypertension;male gender     Objective:    Today's Vitals   08/02/21 1104  PainSc: 0-No pain   There is no height or weight on file to calculate BMI.  Advanced Directives 08/02/2021 06/08/2020 06/07/2020 12/23/2018 10/31/2018 10/21/2018 10/29/2017  Does Patient Have a Medical Advance Directive? Yes Yes Yes Yes Yes Yes Yes  Type of AParamedicof AGarretsonLiving will HTees TohLiving will HSmiths StationLiving will HPawneeLiving will HGrand TerraceLiving will Living will H[place]Living will  Does patient want to  make changes to medical advance directive? No - Patient declined No - Patient declined No - Patient declined No - Patient declined - - -  Copy of HMacedoniain Chart? No - copy requested No - copy requested - - No - copy requested - No - copy requested  Pre-existing out of facility DNR order (yellow form or pink MOST form) - - - - - - -    Current Medications (verified) Outpatient Encounter Medications as of 08/02/2021  Medication Sig   cholecalciferol (VITAMIN D3) 25 MCG (1000 UNIT) tablet Take 1,000 Units by mouth daily.   ELDERBERRY PO Take by mouth daily.   fluticasone (FLONASE) 50 MCG/ACT nasal spray Place 2 sprays into both nostrils daily.   glipiZIDE (GLUCOTROL) 5 MG tablet Take 1 tablet (5 mg total) by mouth daily before breakfast.   levocetirizine (XYZAL) 5 MG tablet Take 1 tablet (5 mg total) by mouth every evening.   metFORMIN (GLUCOPHAGE-XR) 500 MG 24 hr tablet TAKE 2 TABLETS BY MOUTH TWICE A DAY   metoprolol tartrate (LOPRESSOR) 50 MG tablet TAKE 1 TABLET BY MOUTH 2 TIMES DAILY.   nitroGLYCERIN (NITROSTAT) 0.4 MG SL tablet Place 1 tablet (0.4 mg total) under the tongue every 5 (five) minutes as needed for chest pain.   Omega-3 Fatty Acids (FISH OIL PO) Take 1 tablet by mouth daily.   omeprazole (PRILOSEC) 40 MG capsule Take 1 capsule (40 mg total) by mouth 2 (two) times daily.   ONETOUCH DELICA LANCETS 399991111MISC TEST BLOOD SUGAR ONCE DAILY   ONETOUCH VERIO test strip USE UP TO 4 TIMES A DAY AS DIRECTED   rivaroxaban (XARELTO) 20 MG TABS tablet TAKE 1 TABLET BY MOUTH DAILY WITH SUPPER   simvastatin (ZOCOR)  40 MG tablet TAKE 1 TABLET BY MOUTH EVERY EVENING.   triamcinolone cream (KENALOG) 0.1 %    No facility-administered encounter medications on file as of 08/02/2021.    Allergies (verified) Amlodipine besylate and Ivp dye [iodinated diagnostic agents]   History: Past Medical History:  Diagnosis Date   Allergic rhinitis    Asthma    Atrial fibrillation  with rapid ventricular response ([name])    a. newly diagnosed 11/03/13, spont conv to NSR, placed on xarelto.   CAD (coronary artery disease)    a. 1999; s/p CABG: LIMA to LAD, SVG to OM1, SVG to OM2, SVG to RCA  b. Normal nuc 10/2013 (done because of new onset AF.)   Diabetes mellitus ([place])    HLD (hyperlipidemia)    HTN (hypertension)    Nephrolithiasis    Overweight(278.02)    PVD (peripheral vascular disease) ([name])    Carotid stenosis   Stroke [name])    Past Surgical History:  Procedure Laterality Date   CAROTID ENDARTERECTOMY  1999   COLONOSCOPY  2014   negative;Dr Sharlett Iles   COLONOSCOPY W/ POLYPECTOMY  2009   Dr Sharlett Iles   CORONARY ARTERY BYPASS GRAFT  Aug.1999   HEMORRHOID BANDING     INTRACAPSULAR CATARACT EXTRACTION Bilateral 2018   NASAL SINUS SURGERY     Family History  Problem Relation Age of Onset   Prostate cancer Brother    Hemochromatosis Brother    Colon cancer Brother 26   Heart attack Brother 2   Asthma Sister    Heart attack Mother 26   Heart attack Brother 35   Diabetes Father        IDDM   Stroke Father 47   Social History   Socioeconomic History   Marital status: Married    Spouse name: Todd [name]   Number of children: 2   Years of education: Not on file   Highest education level: Not on file  Occupational History   Occupation: Press photographer Manager-retired  Tobacco Use   Smoking status: Never   Smokeless tobacco: Never  Vaping Use   Vaping Use: Never used  Substance and Sexual Activity   Alcohol use: No    Alcohol/week: 0.0 standard drinks   Drug use: No   Sexual activity: Yes  Other Topics Concern   Not on file  Social History Narrative   Not on file   Social Determinants of Health   Financial Resource Strain: Low Risk    Difficulty of Paying Living Expenses: Not hard at all  Food Insecurity: No Food Insecurity   Worried About Charity fundraiser in the Last Year: Never true   [place] in the Last Year: Never true  Transportation  Needs: No Transportation Needs   Lack of Transportation (Medical): No   Lack of Transportation (Non-Medical): No  Physical Activity: Inactive   Days of Exercise per Week: 0 days   Minutes of Exercise per Session: 0 min  Stress: No Stress Concern Present   Feeling of Stress : Not at all  Social Connections: Socially Integrated   Frequency of Communication with Friends and Family: More than three times a week   Frequency of Social Gatherings with Friends and Family: Once a week   Attends Religious Services: More than 4 times per year   Active Member of [employer] or Organizations: No   Attends Archivist Meetings: 1 to 4 times per year   Marital Status: Married    Tobacco Counseling Counseling given:  Not Answered   Clinical Intake:  Pre-visit preparation completed: Yes  Pain : No/denies pain Pain Score: 0-No pain     Nutritional Risks: None Diabetes: Yes CBG done?: No Did pt. bring in CBG monitor from home?: No  How often do you need to have someone help you when you read instructions, pamphlets, or other written materials from your doctor or pharmacy?: 1 - Never What is the last grade level you completed in school?: High School Graduate  Diabetic? yes  Interpreter Needed?: No  Information entered by :: Lisette Abu, LPN   Activities of Daily Living In your present state of health, do you have any difficulty performing the following activities: 08/02/2021 07/07/2021  Hearing? N N  Vision? N N  Difficulty concentrating or making decisions? N N  Walking or climbing stairs? N N  Dressing or bathing? N N  Doing errands, shopping? N N  Preparing Food and eating ? N -  Using the Toilet? N -  In the past six months, have you accidently leaked urine? N -  Do you have problems with loss of bowel control? N -  Managing your Medications? N -  Managing your Finances? N -  Housekeeping or managing your Housekeeping? N -  Some recent data might be hidden     Patient Care Team: Binnie Rail, MD as PCP - General (Internal Medicine) Minus Breeding, MD as PCP - Cardiology (Cardiology) Gatha Mayer, MD as Consulting Physician (Gastroenterology) Minus Breeding, MD as Consulting Physician (Cardiology) Vevelyn Royals, MD as Consulting Physician (Ophthalmology) Darleen Crocker, MD as Consulting Physician (Ophthalmology) Charlton Haws, [facility] as Pharmacist (Pharmacist) Knox Royalty, RN as Case Manager Vevelyn Royals, MD as Consulting Physician (Ophthalmology)  Indicate any recent Medical Services you may have received from other than [facility] providers in the past year (date may be approximate).     Assessment:   This is a routine wellness examination for Todd [name].  Hearing/Vision screen Hearing Screening - Comments:: Patient has difficulty hearing and wears hearing aids. Vision Screening - Comments:: Patient does not wear any corrective lenses. Eye exam done by: Vevelyn Royals.  Dietary issues and exercise activities discussed: Current Exercise Habits: The patient does not participate in regular exercise at present, Exercise limited by: cardiac condition(s)   Goals Addressed   None   Depression Screen PHQ 2/9 Scores 08/02/2021 02/09/2021 06/08/2020 05/07/2020 05/06/2019 10/31/2018 10/29/2017  PHQ - 2 Score 0 0 0 0 0 0 0  PHQ- 9 Score - - - - - - 2    Fall Risk Fall Risk  08/02/2021 07/28/2021 02/09/2021 06/08/2020 11/05/2019  Falls in the past year? 0 0 0 0 0  Number falls in past yr: 0 0 0 0 0  Injury with Fall? 0 0 0 0 0  Comment - N/A- no falls reported - - -  Risk for fall due to : No Fall Risks No Fall Risks - No Fall Risks -  Follow up Falls prevention discussed Falls prevention discussed - Falls evaluation completed -    FALL RISK PREVENTION PERTAINING TO THE HOME:  Any stairs in or around the home? No  If so, are there any without handrails? No  Home free of loose throw rugs in walkways, pet beds, electrical  cords, etc? Yes  Adequate lighting in your home to reduce risk of falls? Yes   ASSISTIVE DEVICES UTILIZED TO PREVENT FALLS:  Life alert? No  Use of a cane, walker or w/c? No  Grab  bars in the bathroom? Yes  Shower chair or bench in shower? Yes  Elevated toilet seat or a handicapped toilet? Yes   TIMED UP AND GO:  Was the test performed? No .  Length of time to ambulate 10 feet: n/a sec.   Cognitive Function: Not able to complete. MMSE - Mini Mental State Exam 01/15/2015  Not completed: Unable to complete        Immunizations Immunization History  Administered Date(s) Administered   Fluad Quad(high Dose 65+) 05/30/2019, 05/06/2021   Influenza Split 07/06/2011, 05/15/2012   Influenza Whole 05/12/2010   Influenza, High Dose Seasonal PF 05/30/2017, 05/02/2018, 06/12/2020   Influenza,inj,Quad PF,6+ Mos 05/21/2013   Influenza-Unspecified 05/20/2014, 06/11/2015, 05/24/2016   PFIZER(Purple Top)SARS-COV-2 Vaccination 10/13/2019, 11/03/2019, 10/12/2020   Pneumococcal Conjugate-13 01/15/2015   Pneumococcal Polysaccharide-23 05/22/2005, 05/05/2010   Tdap 12/03/2012   Zoster Recombinat (Shingrix) 12/19/2017, 03/04/2018   Zoster, Live 10/20/2010    TDAP status: Up to date  Flu Vaccine status: Up to date  Pneumococcal vaccine status: Up to date  Covid-19 vaccine status: Completed vaccines  Qualifies for Shingles Vaccine? Yes   Zostavax completed Yes   Shingrix Completed?: Yes  Screening Tests Health Maintenance  Topic Date Due   OPHTHALMOLOGY EXAM  11/30/2017   COVID-19 Vaccine (4 - Booster for Pfizer series) 12/07/2020   URINE MICROALBUMIN  05/07/2021   Hepatitis C Screening  05/07/2069 (Originally 01/27/1959)   HEMOGLOBIN A1C  11/21/2021   FOOT EXAM  05/23/2022   TETANUS/TDAP  12/04/2022   Pneumonia Vaccine 43+ Years old  Completed   INFLUENZA VACCINE  Completed   Zoster Vaccines- Shingrix  Completed   HPV VACCINES  Aged Out    Health Maintenance  Health  Maintenance Due  Topic Date Due   OPHTHALMOLOGY EXAM  11/30/2017   COVID-19 Vaccine (4 - Booster for Pfizer series) 12/07/2020   URINE MICROALBUMIN  05/07/2021    Colorectal cancer screening: No longer required.   Lung Cancer Screening: (Low Dose CT Chest recommended if Age 35-80 years, 30 pack-year currently smoking OR have quit w/in 15years.) does not qualify.   Lung Cancer Screening Referral: no  Additional Screening:  Hepatitis C Screening: does qualify; Completed yes  Vision Screening: Recommended annual ophthalmology exams for early detection of glaucoma and other disorders of the eye. Is the patient up to date with their annual eye exam?  Yes  Who is the provider or what is the name of the office in which the patient attends annual eye exams? Vevelyn Royals, MD. If pt is not established with a provider, would they like to be referred to a provider to establish care? No .   Dental Screening: Recommended annual dental exams for proper oral hygiene  Community Resource Referral / Chronic Care Management: CRR required this visit?  No   CCM required this visit?  No      Plan:     I have personally reviewed and noted the following in the patient's chart:   Medical and social history Use of alcohol, tobacco or illicit drugs  Current medications and supplements including opioid prescriptions. Patient is not currently taking opioid prescriptions. Functional ability and status Nutritional status Physical activity Advanced directives List of other physicians Hospitalizations, surgeries, and ER visits in previous 12 months Vitals Screenings to include cognitive, depression, and falls Referrals and appointments  In addition, I have reviewed and discussed with patient certain preventive protocols, quality metrics, and best practice recommendations. A written personalized care plan for preventive services as  well as general preventive health recommendations were provided to  patient.     Sheral Flow, LPN   [id number]   Nurse Notes:  Not able to check cognitive status. There were no vitals filed for this visit. There is no height or weight on file to calculate BMI. Hearing Screening - Comments:: Patient has difficulty hearing and wears hearing aids. Vision Screening - Comments:: Patient does not wear any corrective lenses. Eye exam done by: Vevelyn Royals.

## 2021-08-02 NOTE — Patient Instructions (Addendum)
°  Blood work was ordered.     Medications changes include :   decrease omeprazole to once a day.  Start pepcid 40 mg in evening.   After a couple of weeks take the omeprazole every other day and slowly taper.    Your prescription(s) have been submitted to your pharmacy. Please take as directed and contact our office if you believe you are having problem(s) with the medication(s).    Please followup in 6 months

## 2021-08-02 NOTE — Progress Notes (Signed)
Subjective:    Patient ID: Todd [name], male    DOB: [birth year], 80 y.o.   MRN: [id number]  This visit occurred during the SARS-CoV-2 public health emergency.  Safety protocols were in place, including screening questions prior to the visit, additional usage of staff PPE, and extensive cleaning of exam room while observing appropriate contact time as indicated for disinfecting solutions.     HPI The patient is here for follow up of their chronic medical problems, including CAD, Afib, htn, DM, hld, gerd, pulm fibrosis , chronic cough, B12 def.  He is here today with his wife.  Increased omeprazole at his last visit for cough - had ENT appt - said nothing was wrong.  No change with increased omeprazole.    Cough is worse with cold air, taking deep breaths/exhaling.  He has good days and bad days.  Stopped rybelsus due to  weight loss - he is eating more. His weight is stable. Sugars are good at home.    Has been getting B12 injections -he does feel better and has more energy.  He is taking oral B12.  Medications and allergies reviewed with patient and updated if appropriate.  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   Sinus drainage 07/28/2020   Aortic atherosclerosis ([place]) 05/07/2020   GERD (gastroesophageal reflux disease) 05/06/2019   Pulmonary fibrosis ([place]) 05/05/2019   Asbestosis ([place]) 05/05/2019   Ventral hernia without obstruction or gangrene 72/62/0355   Umbilical hernia without obstruction or gangrene 10/31/2018   Eczema 10/29/2017   Hives 08/13/2017   Cough 12/04/2016   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   Hemorrhoids, internal, with bleeding 01/15/2015   Bursitis of left shoulder 01/14/2015   Type 2  diabetes, controlled, with peripheral circulatory disorder ([place]) 12/05/2013   PVD (peripheral vascular disease) ([place]) 11/12/2013   Atrial fibrillation ([place]) 11/03/2013   Carotid artery disease ([place]) 12/20/2009   OVERWEIGHT/OBESITY 11/17/2008   Hyperlipidemia 07/20/2008   Essential hypertension 07/20/2008   Coronary atherosclerosis 07/20/2008   ALLERGIC RHINITIS 07/20/2008   NEPHROLITHIASIS, HX OF 07/20/2008    Current Outpatient Medications on File Prior to Visit  Medication Sig Dispense Refill   benzonatate (TESSALON) 100 MG capsule TAKE 2 CAPSULES BY MOUTH 3 TIMES DAILY AS NEEDED FOR COUGH.     cholecalciferol (VITAMIN D3) 25 MCG (1000 UNIT) tablet Take 1,000 Units by mouth daily.     ELDERBERRY PO Take by mouth daily.     fluticasone (FLONASE) 50 MCG/ACT nasal spray Place 2 sprays into both nostrils daily. 16 g 6   glipiZIDE (GLUCOTROL) 5 MG tablet Take 1 tablet (5 mg total) by mouth daily before breakfast. 30 tablet 5   levocetirizine (XYZAL) 5 MG tablet Take 1 tablet (5 mg total) by mouth every evening. 30 tablet 5   metFORMIN (GLUCOPHAGE-XR) 500 MG 24 hr tablet TAKE 2 TABLETS BY MOUTH TWICE A DAY 120 tablet 1   metoprolol tartrate (LOPRESSOR) 50 MG tablet TAKE 1 TABLET BY MOUTH 2 TIMES DAILY. 180 tablet 2   nitroGLYCERIN (NITROSTAT) 0.4 MG SL tablet Place 1 tablet (0.4 mg total) under the tongue every 5 (five) minutes as needed for chest pain. 100 tablet 3   Omega-3 Fatty Acids (FISH OIL  PO) Take 1 tablet by mouth daily.     omeprazole (PRILOSEC) 40 MG capsule Take 1 capsule (40 mg total) by mouth 2 (two) times daily. 60 capsule 5   ONETOUCH DELICA LANCETS 26S MISC TEST BLOOD SUGAR ONCE DAILY 100 each 3   ONETOUCH VERIO test strip USE UP TO 4 TIMES A DAY AS DIRECTED 100 strip 0   rivaroxaban (XARELTO) 20 MG TABS tablet TAKE 1 TABLET BY MOUTH DAILY WITH SUPPER 30 tablet 1   simvastatin (ZOCOR) 40 MG tablet TAKE 1 TABLET BY MOUTH EVERY EVENING. 90 tablet 2   triamcinolone cream  (KENALOG) 0.1 %   3   No current facility-administered medications on file prior to visit.    Past Medical History:  Diagnosis Date   Allergic rhinitis    Asthma    Atrial fibrillation with rapid ventricular response ([place])    a. newly diagnosed 11/03/13, spont conv to NSR, placed on xarelto.   CAD (coronary artery disease)    a. 1999; s/p CABG: LIMA to LAD, SVG to OM1, SVG to OM2, SVG to RCA  b. Normal nuc 10/2013 (done because of new onset AF.)   Diabetes mellitus ([place])    HLD (hyperlipidemia)    HTN (hypertension)    Nephrolithiasis    Overweight(278.02)    PVD (peripheral vascular disease) ([place])    Carotid stenosis   Stroke [facility])     Past Surgical History:  Procedure Laterality Date   CAROTID ENDARTERECTOMY  1999   COLONOSCOPY  2014   negative;Dr Sharlett Iles   COLONOSCOPY W/ POLYPECTOMY  2009   Dr Sharlett Iles   CORONARY ARTERY BYPASS GRAFT  Aug.1999   HEMORRHOID BANDING     INTRACAPSULAR CATARACT EXTRACTION Bilateral 2018   NASAL SINUS SURGERY      Social History   Socioeconomic History   Marital status: Married    Spouse name: [name]   Number of children: 2   Years of education: Not on file   Highest education level: Not on file  Occupational History   Occupation: Press photographer Manager-retired  Tobacco Use   Smoking status: Never   Smokeless tobacco: Never  Vaping Use   Vaping Use: Never used  Substance and Sexual Activity   Alcohol use: No    Alcohol/week: 0.0 standard drinks   Drug use: No   Sexual activity: Yes  Other Topics Concern   Not on file  Social History Narrative   Not on file   Social Determinants of Health   Financial Resource Strain: Low Risk    Difficulty of Paying Living Expenses: Not hard at all  Food Insecurity: No Food Insecurity   Worried About Charity fundraiser in the Last Year: Never true   [place] in the Last Year: Never true  Transportation Needs: No Transportation Needs   Lack of Transportation (Medical): No   Lack of  Transportation (Non-Medical): No  Physical Activity: Inactive   Days of Exercise per Week: 0 days   Minutes of Exercise per Session: 0 min  Stress: No Stress Concern Present   Feeling of Stress : Not at all  Social Connections: Socially Integrated   Frequency of Communication with Friends and Family: More than three times a week   Frequency of Social Gatherings with Friends and Family: Once a week   Attends Religious Services: More than 4 times per year   Active Member of [employer] or Organizations: No   Attends Archivist Meetings: 1 to 4 times  per year   Marital Status: Married    Family History  Problem Relation Age of Onset   Prostate cancer Brother    Hemochromatosis Brother    Colon cancer Brother 18   Heart attack Brother 20   Asthma Sister    Heart attack Mother 58   Heart attack Brother 71   Diabetes Father        IDDM   Stroke Father 73    Review of Systems  Constitutional:  Negative for fever.  Respiratory:  Positive for cough and wheezing (only w/ cough). Negative for shortness of breath.   Cardiovascular:  Negative for chest pain, palpitations and leg swelling.  Neurological:  Negative for light-headedness and headaches.      Objective:   Vitals:   08/03/21 0831  BP: 126/74  Pulse: 68  Temp: 98.5 F (36.9 C)  SpO2: 97%   BP Readings from Last 3 Encounters:  08/03/21 126/74  07/19/21 124/68  06/22/21 138/82   Wt Readings from Last 3 Encounters:  08/03/21 173 lb (78.5 kg)  07/19/21 175 lb 12.8 oz (79.7 kg)  06/22/21 175 lb (79.4 kg)   Body mass index is 26.3 kg/m.   Physical Exam    Constitutional: Appears well-developed and well-nourished. No distress.  HENT:  Head: Normocephalic and atraumatic.  Neck: Neck supple. No tracheal deviation present. No thyromegaly present.  No cervical lymphadenopathy Cardiovascular: Normal rate, regular rhythm and normal heart sounds.   No murmur heard. No carotid bruit .  No edema Pulmonary/Chest:  Effort normal and breath sounds normal. No respiratory distress. No has no wheezes. No rales.  Skin: Skin is warm and dry. Not diaphoretic.  Psychiatric: Normal mood and affect. Behavior is normal.      Assessment & Plan:    See Problem List for Assessment and Plan of chronic medical problems.

## 2021-08-02 NOTE — Progress Notes (Signed)
error 

## 2021-08-03 ENCOUNTER — Ambulatory Visit (INDEPENDENT_AMBULATORY_CARE_PROVIDER_SITE_OTHER): Payer: HMO | Admitting: Internal Medicine

## 2021-08-03 VITALS — BP 126/74 | HR 68 | Temp 98.5°F | Ht 68.0 in | Wt 173.0 lb

## 2021-08-03 DIAGNOSIS — I2510 Atherosclerotic heart disease of native coronary artery without angina pectoris: Secondary | ICD-10-CM

## 2021-08-03 DIAGNOSIS — E7849 Other hyperlipidemia: Secondary | ICD-10-CM | POA: Diagnosis not present

## 2021-08-03 DIAGNOSIS — E538 Deficiency of other specified B group vitamins: Secondary | ICD-10-CM | POA: Diagnosis not present

## 2021-08-03 DIAGNOSIS — E1151 Type 2 diabetes mellitus with diabetic peripheral angiopathy without gangrene: Secondary | ICD-10-CM

## 2021-08-03 DIAGNOSIS — K219 Gastro-esophageal reflux disease without esophagitis: Secondary | ICD-10-CM | POA: Diagnosis not present

## 2021-08-03 DIAGNOSIS — R053 Chronic cough: Secondary | ICD-10-CM

## 2021-08-03 DIAGNOSIS — I10 Essential (primary) hypertension: Secondary | ICD-10-CM | POA: Diagnosis not present

## 2021-08-03 DIAGNOSIS — I4891 Unspecified atrial fibrillation: Secondary | ICD-10-CM | POA: Diagnosis not present

## 2021-08-03 LAB — CBC WITH DIFFERENTIAL/PLATELET
Basophils Absolute: 0 10*3/uL (ref 0.0–0.1)
Basophils Relative: 0.5 % (ref 0.0–3.0)
Eosinophils Absolute: 0.1 10*3/uL (ref 0.0–0.7)
Eosinophils Relative: 2 % (ref 0.0–5.0)
HCT: 37.3 % — ABNORMAL LOW (ref 39.0–52.0)
Hemoglobin: 12.6 g/dL — ABNORMAL LOW (ref 13.0–17.0)
Lymphocytes Relative: 38.3 % (ref 12.0–46.0)
Lymphs Abs: 2.1 10*3/uL (ref 0.7–4.0)
MCHC: 33.7 g/dL (ref 30.0–36.0)
MCV: 102.2 fl — ABNORMAL HIGH (ref 78.0–100.0)
Monocytes Absolute: 0.5 10*3/uL (ref 0.1–1.0)
Monocytes Relative: 9.1 % (ref 3.0–12.0)
Neutro Abs: 2.8 10*3/uL (ref 1.4–7.7)
Neutrophils Relative %: 50.1 % (ref 43.0–77.0)
Platelets: 175 10*3/uL (ref 150.0–400.0)
RBC: 3.65 Mil/uL — ABNORMAL LOW (ref 4.22–5.81)
RDW: 14.9 % (ref 11.5–15.5)
WBC: 5.6 10*3/uL (ref 4.0–10.5)

## 2021-08-03 LAB — COMPREHENSIVE METABOLIC PANEL
ALT: 10 U/L (ref 0–53)
AST: 17 U/L (ref 0–37)
Albumin: 4 g/dL (ref 3.5–5.2)
Alkaline Phosphatase: 53 U/L (ref 39–117)
BUN: 22 mg/dL (ref 6–23)
CO2: 27 mEq/L (ref 19–32)
Calcium: 9.6 mg/dL (ref 8.4–10.5)
Chloride: 107 mEq/L (ref 96–112)
Creatinine, Ser: 0.83 mg/dL (ref 0.40–1.50)
GFR: 83.83 mL/min (ref >60.00)
Glucose, Bld: 142 mg/dL — ABNORMAL HIGH (ref 70–99)
Potassium: 4.6 mEq/L (ref 3.5–5.1)
Sodium: 141 mEq/L (ref 135–145)
Total Bilirubin: 0.6 mg/dL (ref 0.2–1.2)
Total Protein: 7.6 g/dL (ref 6.0–8.3)

## 2021-08-03 LAB — HEMOGLOBIN A1C: Hgb A1c MFr Bld: 7.5 % — ABNORMAL HIGH (ref 4.6–6.5)

## 2021-08-03 LAB — VITAMIN B12: Vitamin B-12: 567 pg/mL (ref 211–911)

## 2021-08-03 MED ORDER — OMEPRAZOLE 40 MG PO CPDR
40.0000 mg | DELAYED_RELEASE_CAPSULE | Freq: Every day | ORAL | 5 refills | Status: DC
Start: 2021-08-03 — End: 2022-05-08

## 2021-08-03 MED ORDER — FAMOTIDINE 40 MG PO TABS: 40.0000 mg | ORAL_TABLET | Freq: Every day | ORAL | 1 refills | Status: DC

## 2021-08-03 NOTE — Assessment & Plan Note (Signed)
Chronic Blood pressure well controlled CMP Continue metoprolol 50 mg twice daily 

## 2021-08-03 NOTE — Assessment & Plan Note (Addendum)
Chronic-diagnosed 05/2021 Has been doing B12 injections for 2 months He is taking B12 orally daily-5000 mcg Can hold off on B12 injections and see how he does with oral supplementation only Check B12 level and monitor

## 2021-08-03 NOTE — Assessment & Plan Note (Addendum)
Chronic Likely multifactorial Following with pulmonary for his pulmonary fibrosis-they did not feel that this is a primary pulmonary issue Increased omeprazole to 40 mg twice daily last visit-no help so we will discontinue in the event that the omeprazole is contributing to the cough Saw ENT-they did not see any cause-exam was normal No improvement with antihistamine

## 2021-08-03 NOTE — Assessment & Plan Note (Addendum)
Chronic GERD controlled Taking omeprazole 40 mg twice daily-increasing this at his last visit did not improve his cough and his exam by ENT was normal We will decrease omeprazole to 40 mg once daily and add Pepcid 40 mg at night Slowly I have asked him after a couple of weeks to taper off the omeprazole-there is a small potential that that is causing the cough May need to increase Pepcid to twice daily-advised them to update me with questions or concerns

## 2021-08-03 NOTE — Assessment & Plan Note (Addendum)
Chronic Discontinued Rybelsus as his last visit due to decreased appetite/weight loss Continue metformin XR 1000 mg twice daily and glipizide 5 mg daily before breakfast Check A1c Stressed diabetic diet and regular exercise

## 2021-08-03 NOTE — Assessment & Plan Note (Signed)
Chronic Following with cardiology On Xarelto, metoprolol, simvastatin

## 2021-08-03 NOTE — Assessment & Plan Note (Signed)
Chronic Asymptomatic On Xarelto and metoprolol Following with cardiology CBC, CMP

## 2021-08-03 NOTE — Assessment & Plan Note (Addendum)
Chronic Regular exercise and healthy diet encouraged Continue simvastatin 40 mg daily

## 2021-08-04 ENCOUNTER — Other Ambulatory Visit: Payer: Self-pay | Admitting: Internal Medicine

## 2021-08-04 MED ORDER — GLIPIZIDE 5 MG PO TABS: 5.0000 mg | ORAL_TABLET | Freq: Two times a day (BID) | ORAL | 1 refills | Status: DC

## 2021-08-09 ENCOUNTER — Telehealth: Payer: Self-pay | Admitting: Pulmonary Disease

## 2021-08-09 DIAGNOSIS — J849 Interstitial pulmonary disease, unspecified: Secondary | ICD-10-CM

## 2021-08-09 DIAGNOSIS — J8410 Pulmonary fibrosis, unspecified: Secondary | ICD-10-CM

## 2021-08-09 DIAGNOSIS — Z5181 Encounter for therapeutic drug level monitoring: Secondary | ICD-10-CM

## 2021-08-09 DIAGNOSIS — Z7189 Other specified counseling: Secondary | ICD-10-CM

## 2021-08-10 MED ORDER — PIRFENIDONE 267 MG PO TABS: ORAL_TABLET | ORAL | 0 refills | Status: DC

## 2021-08-10 MED ORDER — PIRFENIDONE 267 MG PO TABS: 801.0000 mg | ORAL_TABLET | Freq: Three times a day (TID) | ORAL | 4 refills | Status: DC

## 2021-08-10 NOTE — Telephone Encounter (Signed)
Subjective:  Patient's wife, [name], called today by [facility]monary pharmacy team for Esbriet new start.   Patient was last seen by Dr. Vaughan Browner on 07/19/21.  Patient has ILD with progressive phenotype.  Patient reports significant cough - has tried all of the recommendations from Dr. Vaughan Browner. She states that patient may cough until chest hurts and is sometimes intermittent. States the cough started after he had COVID.  History of elevated LFTs: No History of diarrhea, nausea, vomiting: No  Objective: Allergies  Allergen Reactions   Amlodipine Besylate     Rash Because of a history of documented adverse serious drug reaction;Medi Alert bracelet  is recommended   Ivp Dye [Iodinated Diagnostic Agents]     Rash Because of a history of documented adverse serious drug reaction;Medi Alert bracelet  is recommended    Outpatient Encounter Medications as of 08/09/2021  Medication Sig   benzonatate (TESSALON) 100 MG capsule TAKE 2 CAPSULES BY MOUTH 3 TIMES DAILY AS NEEDED FOR COUGH.   cholecalciferol (VITAMIN D3) 25 MCG (1000 UNIT) tablet Take 1,000 Units by mouth daily.   ELDERBERRY PO Take by mouth daily.   famotidine (PEPCID) 40 MG tablet Take 1 tablet (40 mg total) by mouth daily.   fluticasone (FLONASE) 50 MCG/ACT nasal spray Place 2 sprays into both nostrils daily.   glipiZIDE (GLUCOTROL) 5 MG tablet Take 1 tablet (5 mg total) by mouth 2 (two) times daily before a meal.   levocetirizine (XYZAL) 5 MG tablet Take 1 tablet (5 mg total) by mouth every evening.   metFORMIN (GLUCOPHAGE-XR) 500 MG 24 hr tablet TAKE 2 TABLETS BY MOUTH TWICE A DAY   metoprolol tartrate (LOPRESSOR) 50 MG tablet TAKE 1 TABLET BY MOUTH 2 TIMES DAILY.   nitroGLYCERIN (NITROSTAT) 0.4 MG SL tablet Place 1 tablet (0.4 mg total) under the tongue every 5 (five) minutes as needed for chest pain.   Omega-3 Fatty Acids (FISH OIL PO) Take 1 tablet by mouth daily.   omeprazole (PRILOSEC) 40 MG capsule Take 1 capsule (40 mg  total) by mouth daily.   ONETOUCH DELICA LANCETS 50D MISC TEST BLOOD SUGAR ONCE DAILY   ONETOUCH VERIO test strip USE UP TO 4 TIMES A DAY AS DIRECTED   rivaroxaban (XARELTO) 20 MG TABS tablet TAKE 1 TABLET BY MOUTH DAILY WITH SUPPER   simvastatin (ZOCOR) 40 MG tablet TAKE 1 TABLET BY MOUTH EVERY EVENING.   triamcinolone cream (KENALOG) 0.1 %    No facility-administered encounter medications on file as of 08/09/2021.     Immunization History  Administered Date(s) Administered   Fluad Quad(high Dose 65+) 05/30/2019, 05/06/2021   Influenza Split 07/06/2011, 05/15/2012   Influenza Whole 05/12/2010   Influenza, High Dose Seasonal PF 05/30/2017, 05/02/2018, 06/12/2020   Influenza,inj,Quad PF,6+ Mos 05/21/2013   Influenza-Unspecified 05/20/2014, 06/11/2015, 05/24/2016   PFIZER(Purple Top)SARS-COV-2 Vaccination 10/13/2019, 11/03/2019, 10/12/2020   Pneumococcal Conjugate-13 01/15/2015   Pneumococcal Polysaccharide-23 05/22/2005, 05/05/2010   Tdap 12/03/2012   Zoster Recombinat (Shingrix) 12/19/2017, 03/04/2018   Zoster, Live 10/20/2010    PFT's TLC  Date Value Ref Range Status  08/31/2020 4.64 L Final    CMP     Component Value Date/Time   NA 141 08/03/2021 0935   K 4.6 08/03/2021 0935   CL 107 08/03/2021 0935   CO2 27 08/03/2021 0935   GLUCOSE 142 (H) 08/03/2021 0935   BUN 22 08/03/2021 0935   CREATININE 0.83 08/03/2021 0935   CREATININE 0.93 05/07/2020 1022   CALCIUM 9.6 08/03/2021 0935  PROT 7.6 08/03/2021 0935   ALBUMIN 4.0 08/03/2021 0935   AST 17 08/03/2021 0935   ALT 10 08/03/2021 0935   ALKPHOS 53 08/03/2021 0935   BILITOT 0.6 08/03/2021 0935   GFRNONAA 79 05/07/2020 1022   GFRAA 91 05/07/2020 1022    CBC    Component Value Date/Time   WBC 5.6 08/03/2021 0935   RBC 3.65 (L) 08/03/2021 0935   HGB 12.6 (L) 08/03/2021 0935   HCT 37.3 (L) 08/03/2021 0935   PLT 175.0 08/03/2021 0935   MCV 102.2 (H) 08/03/2021 0935   MCH 33.6 (H) 05/07/2020 1022   MCHC 33.7  08/03/2021 0935   RDW 14.9 08/03/2021 0935   LYMPHSABS 2.1 08/03/2021 0935   MONOABS 0.5 08/03/2021 0935   EOSABS 0.1 08/03/2021 0935   BASOSABS 0.0 08/03/2021 0935    LFT's Hepatic Function Latest Ref Rng & Units 08/03/2021 05/23/2021 11/08/2020  Total Protein 6.0 - 8.3 g/dL 7.6 7.2 7.3  Albumin 3.5 - 5.2 g/dL 4.0 3.8 4.1  AST 0 - 37 U/L 17 17 20   ALT 0 - 53 U/L 10 11 14   Alk Phosphatase 39 - 117 U/L 53 64 66  Total Bilirubin 0.2 - 1.2 mg/dL 0.6 0.7 0.7  Bilirubin, Direct <=0.2 mg/dL - - -    HRCT (07/01/21) - Redemonstrated moderate pulmonary fibrosis in a pattern with apical to basal gradient, featuring irregular peripheral interstitial opacity, septal thickening, traction bronchiectasis and subpleural bronchiolectasis, but without evidence of honeycombing. Fibrotic findings are slightly worsened in comparison to immediate prior examination dated 09/02/2020. 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  Patient verbalized understanding. Patient education handout provided.   Goals of Therapy: Will not stop or reverse the progression of ILD. It will slow the progression of ILD.   Dosing: Starting dose will be Esbriet 267 mg 1 tablet three times daily for 7 days, then 2 tablets three times daily for 7 days, then 3 tablets three times daily.  Maintenance dose will be 801 mg 1 tablet three times daily if tolerated.  Stressed the importance of taking with meals and space at least 5-6 hours apart to minimize stomach upset.   Adverse Effects: Nausea, vomiting, diarrhea, weight loss Abdominal pain GERD Sun sensitivity/rash - patient advised to wear sunscreen when exposed to sunlight Dizziness Fatigue  Monitoring: Monitor for diarrhea, nausea and vomiting, GI  perforation, hepatotoxicity  Monitor LFTs - baseline, monthly for first 6 months, then every 3 months routinely CBC w differential at baseline and every 3 months routinely  Access: Approval of Esbriet through: patient assistance Rx for maintenance dose sent to: [place] [facility], dispensing) for [place]: [phone] First shipment scheduled to arrive to patient's home on 08/26/21  Medication Reconciliation A drug regimen assessment was performed, including review of allergies, interactions, disease-state management, dosing and immunization history. Medications were reviewed with the patient, including name, instructions, indication, goals of therapy, potential side effects, importance of adherence, and safe use.  Immunizations Patient is indicated for the influenzae, pneumonia, and shingles vaccinations. Patient has received 3 COVID19 vaccines.  This appointment required 6 minutes of patient care (this includes precharting, chart review, review of results, face-to-face care, etc.).  F/u appointment and PFTs with Dr. Vaughan Browner are scheduled for 08/24/21.  Thank you for involving pharmacy to assist in providing this patient's care.    Knox Saliva,  PharmD, MPH, BCPS Clinical Pharmacist (Rheumatology and Pulmonology)

## 2021-08-11 ENCOUNTER — Encounter: Payer: Self-pay | Admitting: Pulmonary Disease

## 2021-08-11 NOTE — Telephone Encounter (Signed)
PM please advise. Thanks   Todd [name], [name] Pool Phone Number: [phone]   Is there anything else my father can try for this horrific cough he still has? Those little Pearl pills are not working. He is miserable with this on going cough. We have tried so many things. Nothing works. Is there not some other medication on the market that he could try? He hasnt started the other medicine for the pulmonary fibrosis because it took forever to get approved. Now we are looking at it shipping on Jan 6.  Thank you for your time.

## 2021-08-12 MED ORDER — HYDROCODONE BIT-HOMATROP MBR 5-1.5 MG/5ML PO SOLN: 5.0000 mL | Freq: Four times a day (QID) | ORAL | 0 refills | Status: DC | PRN

## 2021-08-12 NOTE — Telephone Encounter (Signed)
I called in a prescription for hycodan cough syrup to help him with cough over the holidays. I will re assess at time of return visit the first week of January

## 2021-08-23 NOTE — Progress Notes (Signed)
Cardiology Office Note   Date:  08/25/2021   ID:  Todd [name], DOB Dec 30, 1940, MRN [id number]  PCP:  Binnie Rail, MD  Cardiologist:   Minus Breeding, MD   Chief Complaint  Patient presents with   Coronary Artery Disease      History of Present Illness: Todd [name] is a 81 y.o. male who presents for follow up of CAD and atrial fibrillation.  In March of 2015 he had a stress perfusion study demonstrating no evidence of ischemia or infarct with preserved ejection fraction.    Since I last saw him he has had no new cardiac complaints.  Unfortunately he still has his chronic cough following COVID a couple of years ago.  He gets out and he does some activities but he stops because his nose is running and he has his cough.  He does not feel particularly short of breath and is not describing any chest pressure, neck or arm discomfort.  He is not having any palpitations, presyncope or syncope.  He has no PND or orthopnea.   Past Medical History:  Diagnosis Date   Allergic rhinitis    Asthma    Atrial fibrillation with rapid ventricular response ([place])    a. newly diagnosed 11/03/13, spont conv to NSR, placed on xarelto.   CAD (coronary artery disease)    a. 1999; s/p CABG: LIMA to LAD, SVG to OM1, SVG to OM2, SVG to RCA  b. Normal nuc 10/2013 (done because of new onset AF.)   Diabetes mellitus ([place])    HLD (hyperlipidemia)    HTN (hypertension)    Nephrolithiasis    Overweight(278.02)    PVD (peripheral vascular disease) ([place])    Carotid stenosis   Stroke [facility])     Past Surgical History:  Procedure Laterality Date   CAROTID ENDARTERECTOMY  1999   COLONOSCOPY  2014   negative;Dr Sharlett Iles   COLONOSCOPY W/ POLYPECTOMY  2009   Dr Sharlett Iles   CORONARY ARTERY BYPASS GRAFT  Aug.1999   HEMORRHOID BANDING     INTRACAPSULAR CATARACT EXTRACTION Bilateral 2018   NASAL SINUS SURGERY       Current Outpatient Medications  Medication Sig Dispense Refill   albuterol (VENTOLIN  HFA) 108 (90 Base) MCG/ACT inhaler Inhale 2 puffs into the lungs every 6 (six) hours as needed for wheezing or shortness of breath. 8 g 6   benzonatate (TESSALON) 100 MG capsule TAKE 2 CAPSULES BY MOUTH 3 TIMES DAILY AS NEEDED FOR COUGH.     cholecalciferol (VITAMIN D3) 25 MCG (1000 UNIT) tablet Take 1,000 Units by mouth daily.     ELDERBERRY PO Take by mouth daily.     famotidine (PEPCID) 40 MG tablet Take 1 tablet (40 mg total) by mouth daily. 90 tablet 1   fluticasone (FLONASE) 50 MCG/ACT nasal spray Place 2 sprays into both nostrils daily. 16 g 6   glipiZIDE (GLUCOTROL) 5 MG tablet Take 1 tablet (5 mg total) by mouth 2 (two) times daily before a meal. 180 tablet 1   HYDROcodone bit-homatropine (HYCODAN) 5-1.5 MG/5ML syrup Take 5 mLs by mouth every 6 (six) hours as needed for cough. 240 mL 0   levocetirizine (XYZAL) 5 MG tablet Take 1 tablet (5 mg total) by mouth every evening. 30 tablet 5   metFORMIN (GLUCOPHAGE-XR) 500 MG 24 hr tablet TAKE 2 TABLETS BY MOUTH TWICE A DAY 120 tablet 1   Omega-3 Fatty Acids (FISH OIL PO) Take 1 tablet by mouth daily.  omeprazole (PRILOSEC) 40 MG capsule Take 1 capsule (40 mg total) by mouth daily. 30 capsule 5   ONETOUCH DELICA LANCETS 23R MISC TEST BLOOD SUGAR ONCE DAILY 100 each 3   ONETOUCH VERIO test strip USE UP TO 4 TIMES A DAY AS DIRECTED 100 strip 0   [START ON 08/26/2021] Pirfenidone (ESBRIET) 267 MG TABS Take 1 tab three times daily for 7 days, then 2 tabs three times daily for 7 days, then 3 tabs three times daily thereafter. 207 tablet 0   [START ON 09/19/2021] Pirfenidone (ESBRIET) 267 MG TABS Take 3 tablets (801 mg total) by mouth with breakfast, with lunch, and with evening meal. 270 tablet 4   triamcinolone cream (KENALOG) 0.1 %   3   metoprolol tartrate (LOPRESSOR) 50 MG tablet Take 1 tablet (50 mg total) by mouth 2 (two) times daily. 60 tablet 11   nitroGLYCERIN (NITROSTAT) 0.4 MG SL tablet Place 1 tablet (0.4 mg total) under the tongue every 5  (five) minutes as needed for chest pain. 100 tablet 3   rivaroxaban (XARELTO) 20 MG TABS tablet Take 1 tablet (20 mg total) by mouth daily with supper. 30 tablet 1   simvastatin (ZOCOR) 40 MG tablet Take 1 tablet (40 mg total) by mouth every evening. 90 tablet 2   No current facility-administered medications for this visit.    Allergies:   Amlodipine besylate and Ivp dye [iodinated contrast media]    ROS:  Please see the history of present illness.   Otherwise, review of systems are positive for none.   All other systems are reviewed and negative.    PHYSICAL EXAM: VS:  BP 138/68    Pulse 77    Ht 5\' 8"  (1.727 m)    Wt 178 lb 3.2 oz (80.8 kg)    SpO2 97%    BMI 27.10 kg/m  , BMI Body mass index is 27.1 kg/m. GENERAL:  Well appearing NECK:  No jugular venous distention, waveform within normal limits, carotid upstroke brisk and symmetric, no bruits, no thyromegaly LUNGS: Decreased breath sounds bilaterally with few expiratory wheezes CHEST:  Well healed sternotomy scar. HEART:  PMI not displaced or sustained,S1 and S2 within normal limits, no S3, no S4, no clicks, no rubs, no murmurs ABD:  Flat, positive bowel sounds normal in frequency in pitch, no bruits, no rebound, no guarding, no midline pulsatile mass, no hepatomegaly, no splenomegaly EXT:  2 plus pulses throughout, no edema, no cyanosis no clubbing   EKG:  EKG is  ordered today. The ekg ordered today demonstrates sinus rhythm, rate 77, axis within normal limits, intervals within normal limits, no acute ST-T ,wave changes, PVCs   Recent Labs: 05/23/2021: TSH 4.45 08/03/2021: ALT 10; BUN 22; Creatinine, Ser 0.83; Hemoglobin 12.6; Platelets 175.0; Potassium 4.6; Sodium 141    Lipid Panel    Component Value Date/Time   CHOL 132 11/08/2020 1011   TRIG 150.0 (H) 11/08/2020 1011   TRIG 94 07/19/2006 0727   HDL 37.10 (L) 11/08/2020 1011   CHOLHDL 4 11/08/2020 1011   VLDL 30.0 11/08/2020 1011   LDLCALC 65 11/08/2020 1011       Wt Readings from Last 3 Encounters:  08/25/21 178 lb 3.2 oz (80.8 kg)  08/24/21 177 lb (80.3 kg)  08/03/21 173 lb (78.5 kg)      Other studies Reviewed: Additional studies/ records that were reviewed today include: Labs. Review of the above records demonstrates:  Please see elsewhere in the note.  ASSESSMENT AND PLAN:  PAROXYSMAL ATRIAL FIB - The patient tolerates anticoagulation. Todd [name] has a CHA2DS2 - VASc score of 6.    There are   CAD -  The patient has no new sypmtoms.  No further cardiovascular testing is indicated.  We will continue with aggressive risk reduction and meds as listed.  CAROTID ARTERY DISEASE -  He had mild plaque in  in Jan 2019.  No further imaging.   HYPERLIPIDEMIA -  LDL was 65 with an HDL of 37.  No change in therapy.    OVERWEIGHT/OBESITY -    He said he was actually taken off Rybelsus because he lost too much weight.   HYPERTENSION - He reports his blood pressure is at target.  No change in therapy.   DM A1c is up to 7.5.  It is bounced around because he needed steroids intermittently.  Again he was taken off of Rybelsus.  I will defer to Binnie Rail, MD consideration of [place].   Current medicines are reviewed at length with the patient today.  The patient does not have concerns regarding medicines.  The following changes have been made:  None  Labs/ tests ordered today include:   None  Orders Placed This Encounter  Procedures   EKG 12-Lead     Disposition:   FU with 12 months.     Signed, Minus Breeding, MD  08/25/2021 10:50 AM    [place]

## 2021-08-24 ENCOUNTER — Other Ambulatory Visit: Payer: Self-pay

## 2021-08-24 ENCOUNTER — Ambulatory Visit (INDEPENDENT_AMBULATORY_CARE_PROVIDER_SITE_OTHER): Payer: HMO | Admitting: Pulmonary Disease

## 2021-08-24 ENCOUNTER — Encounter: Payer: Self-pay | Admitting: Pulmonary Disease

## 2021-08-24 ENCOUNTER — Ambulatory Visit: Payer: HMO | Admitting: Pulmonary Disease

## 2021-08-24 VITALS — BP 134/80 | HR 82 | Temp 97.6°F | Ht 68.0 in | Wt 177.0 lb

## 2021-08-24 DIAGNOSIS — Z5181 Encounter for therapeutic drug level monitoring: Secondary | ICD-10-CM | POA: Diagnosis not present

## 2021-08-24 DIAGNOSIS — J849 Interstitial pulmonary disease, unspecified: Secondary | ICD-10-CM

## 2021-08-24 DIAGNOSIS — J8410 Pulmonary fibrosis, unspecified: Secondary | ICD-10-CM

## 2021-08-24 LAB — PULMONARY FUNCTION TEST
DL/VA % pred: 111 %
DL/VA: 4.41 ml/min/mmHg/L
DLCO cor % pred: 61 %
DLCO cor: 14.33 ml/min/mmHg
DLCO unc % pred: 58 %
DLCO unc: 13.45 ml/min/mmHg
FEF 25-75 Post: 1.45 L/sec
FEF 25-75 Pre: 2.07 L/sec
FEF2575-%Change-Post: -29 %
FEF2575-%Pred-Post: 76 %
FEF2575-%Pred-Pre: 109 %
FEV1-%Change-Post: -6 %
FEV1-%Pred-Post: 56 %
FEV1-%Pred-Pre: 60 %
FEV1-Post: 1.53 L
FEV1-Pre: 1.64 L
FEV1FVC-%Change-Post: -12 %
FEV1FVC-%Pred-Pre: 119 %
FEV6-%Change-Post: 6 %
FEV6-%Pred-Post: 57 %
FEV6-%Pred-Pre: 54 %
FEV6-Post: 2.03 L
FEV6-Pre: 1.9 L
FEV6FVC-%Pred-Post: 107 %
FEV6FVC-%Pred-Pre: 107 %
FVC-%Change-Post: 6 %
FVC-%Pred-Post: 53 %
FVC-%Pred-Pre: 50 %
FVC-Post: 2.03 L
FVC-Pre: 1.9 L
Post FEV1/FVC ratio: 75 %
Post FEV6/FVC ratio: 100 %
Pre FEV1/FVC ratio: 86 %
Pre FEV6/FVC Ratio: 100 %
RV % pred: 14 %
RV: 0.37 L
TLC % pred: 41 %
TLC: 2.78 L

## 2021-08-24 MED ORDER — ALBUTEROL SULFATE HFA 108 (90 BASE) MCG/ACT IN AERS: 2.0000 | INHALATION_SPRAY | Freq: Four times a day (QID) | RESPIRATORY_TRACT | 6 refills | Status: DC | PRN

## 2021-08-24 NOTE — Progress Notes (Signed)
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 is a 81 year old with history of allergies, rhinitis, atrial fibrillation, hypertension, hyperlipidemia, diabetes, GERD Follow-up for stable asbestosis, chronic cough, COVID-19 infection in September 2021  Developed COVID-19 in September 2021.  He was sick for about a week.  Treated with outpatient monoclonal antibody therapy and did not require hospitalization His cough has worsened since his COVID-19 infection.  He was put on prednisone for a month with slow taper with improvement in symptoms Continues to have persistent cough.  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  He has a shipment coming in later this week  Complains of persistent cough, intermittent hoarseness of voice He was given a codeine cough syrup from [facility] office which helped somewhat.  He is now on Prilosec in the morning and Pepcid at night  Outpatient Encounter Medications as of 08/24/2021  Medication Sig   benzonatate (TESSALON) 100 MG capsule TAKE 2 CAPSULES BY MOUTH 3 TIMES DAILY AS NEEDED FOR COUGH.   cholecalciferol (VITAMIN D3) 25 MCG (1000 UNIT) tablet Take 1,000 Units by mouth daily.   ELDERBERRY PO Take by mouth daily.   famotidine (PEPCID) 40 MG tablet Take 1 tablet (40 mg total) by mouth daily.   fluticasone (FLONASE) 50 MCG/ACT nasal spray Place 2 sprays into both nostrils daily.   glipiZIDE (GLUCOTROL) 5 MG tablet Take 1 tablet (5 mg total) by mouth 2 (two) times daily before a meal.   HYDROcodone  bit-homatropine (HYCODAN) 5-1.5 MG/5ML syrup Take 5 mLs by mouth every 6 (six) hours as needed for cough.   levocetirizine (XYZAL) 5 MG tablet Take 1 tablet (5 mg total) by mouth every evening.   metFORMIN (GLUCOPHAGE-XR) 500 MG 24 hr tablet TAKE 2 TABLETS BY MOUTH TWICE A DAY   metoprolol tartrate (LOPRESSOR) 50 MG tablet TAKE 1 TABLET BY MOUTH 2 TIMES DAILY.   nitroGLYCERIN (NITROSTAT) 0.4 MG SL tablet Place 1 tablet (0.4 mg total) under the tongue every 5 (five) minutes as needed for chest pain.   Omega-3 Fatty Acids (FISH OIL PO) Take 1 tablet by mouth daily.   omeprazole (PRILOSEC) 40 MG capsule Take 1 capsule (40 mg total) by mouth daily.   ONETOUCH DELICA LANCETS 46K MISC TEST BLOOD SUGAR ONCE DAILY   ONETOUCH VERIO test strip USE UP TO 4 TIMES A DAY AS DIRECTED   rivaroxaban (XARELTO) 20 MG TABS tablet TAKE 1 TABLET BY MOUTH DAILY WITH SUPPER   simvastatin (ZOCOR) 40 MG tablet TAKE 1 TABLET BY MOUTH EVERY EVENING.   triamcinolone cream (KENALOG) 0.1 %    [START ON 08/26/2021] Pirfenidone (ESBRIET) 267 MG TABS Take 1 tab three times daily for 7 days, then 2 tabs three times daily for 7 days, then 3 tabs three times daily thereafter. (Patient not taking: Reported on 08/24/2021)   [START ON 09/19/2021] Pirfenidone (ESBRIET) 267 MG TABS Take 3 tablets (801 mg total) by mouth with breakfast, with lunch, and with evening meal. (Patient not taking: Reported on 08/24/2021)  No facility-administered encounter medications on file as of 08/24/2021.   Physical Exam: Gen:      No acute distress HEENT:  EOMI, sclera anicteric Neck:     No masses; no thyromegaly Lungs:    Clear to auscultation bilaterally; normal respiratory effort CV:         Regular rate and rhythm; no murmurs Abd:      + bowel sounds; soft, non-tender; no palpable masses, no distension Ext:    No edema; adequate peripheral perfusion Skin:      Warm and dry; no rash Neuro: alert and oriented x 3 Psych: normal mood and affect   Data  Reviewed: Imaging Chest x-ray 12/04/16-stable interstitial opacities CT abdomen 11/20/13-mild reticular opacities at the bases CT high-resolution 01/02/17- subpleural reticulation, mild traction bronchiectasis, possible early honeycombing with basal gradient. 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  Slightly worse compared to 2018 High-res CT 03/21/2019-probable UIP High-res CT 03/30/2020-unchanged probable UIP fibrosis High-res CT 09/02/2020- unchanged probable UIP fibrosis High-res CT 06/30/2021-slight worsening of fibrosis and probable UIP pattern I have reviewed the images personally  PFTs  03/20/17- FVC 2.74 (72%], FEV1 2.38 (87%), F/F 87, TLC 62%, DLCO 15.89 (56%)  01/15/2018-FVC 2.74 [73%], FEV1 2.33 [86%], F/F 85, TLC 71%, DLCO 13.13 (46%)  07/23/2018-FVC 2.51 [69%), FEV1 2.10 [78%), DLCO 15.16 (53%) Mild restriction, moderate-severe diffusion defect.  04/02/2019 FVC 2.70 [72%], FEV1 2.18 [82%], F/F 81, DLCO 13.8 [60%] Moderate diffusion defect  08/31/2020 FVC 2.52 [65%], FEV1 2.25 [82%], F/F 89, TLC 4.64 [69%], DLCO 20.59 [88%] Mild restriction.  Improvement in diffusion capacity  08/24/2021 FVC 2.03 [53%], FEV1 1.53 [56%], F/F 75, TLC 2.78 [41%], DLCO 13.45 [58%] Severe restriction, moderate diffusion defect  FENO 12/18/16- 12  Labs ILD serologies 01/09/17- all negative except for mild elevation in SCL 75.  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  PFTs today show significant worsening of restriction and diffusion impairment.  He has been approved for Esbriet and is due to get a shipment on 1/6 Recent LFTs are normal.  Recheck LFTs in 1 month  Chronic cough Likely secondary to pulmonary fibrosis He has been treated adequately for GERD He has some  improvement with codeine cough syrup.  He reports that he felt better after albuterol inhaler which was given with the PFTs today.  We will give him a prescription for albuterol as needed  Consider thalidomide for treatment of IPF related cough he continues to have persistent symptoms.  But data for those is not strong.  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  He had mild symptoms which did not require hospitalization   Health maintenance 01/15/2015-Prevnar 13 05/05/2010-Pneumovax  Plan/Recommendations: Initiate Esbriet Follow-up hepatic panel  Marshell Garfinkel MD [facility] 08/24/2021, 10:26 AM  CC: Binnie Rail, MD

## 2021-08-24 NOTE — Progress Notes (Signed)
Full PFT performed today. °

## 2021-08-24 NOTE — Patient Instructions (Signed)
I am glad the cough is improved Continue the cough medication and antiacid medication as tolerated We will call in an albuterol as needed prescription Start the antiscarring medication once he received that Please come in for lab tests in 1 month as we need to check your liver panel Follow-up in clinic in 2 months

## 2021-08-24 NOTE — Patient Instructions (Signed)
Full PFT performed today. °

## 2021-08-25 ENCOUNTER — Ambulatory Visit (INDEPENDENT_AMBULATORY_CARE_PROVIDER_SITE_OTHER): Payer: HMO | Admitting: Cardiology

## 2021-08-25 ENCOUNTER — Encounter: Payer: Self-pay | Admitting: Cardiology

## 2021-08-25 VITALS — BP 138/68 | HR 77 | Ht 68.0 in | Wt 178.2 lb

## 2021-08-25 DIAGNOSIS — I4891 Unspecified atrial fibrillation: Secondary | ICD-10-CM

## 2021-08-25 DIAGNOSIS — I2510 Atherosclerotic heart disease of native coronary artery without angina pectoris: Secondary | ICD-10-CM

## 2021-08-25 DIAGNOSIS — I10 Essential (primary) hypertension: Secondary | ICD-10-CM | POA: Diagnosis not present

## 2021-08-25 DIAGNOSIS — E118 Type 2 diabetes mellitus with unspecified complications: Secondary | ICD-10-CM | POA: Diagnosis not present

## 2021-08-25 DIAGNOSIS — E785 Hyperlipidemia, unspecified: Secondary | ICD-10-CM | POA: Diagnosis not present

## 2021-08-25 MED ORDER — RIVAROXABAN 20 MG PO TABS: 20.0000 mg | ORAL_TABLET | Freq: Every day | ORAL | 1 refills | Status: DC

## 2021-08-25 MED ORDER — SIMVASTATIN 40 MG PO TABS: 40.0000 mg | ORAL_TABLET | Freq: Every evening | ORAL | 2 refills | Status: DC

## 2021-08-25 MED ORDER — METOPROLOL TARTRATE 50 MG PO TABS: 50.0000 mg | ORAL_TABLET | Freq: Two times a day (BID) | ORAL | 11 refills | Status: DC

## 2021-08-25 MED ORDER — NITROGLYCERIN 0.4 MG SL SUBL: 0.4000 mg | SUBLINGUAL_TABLET | SUBLINGUAL | 3 refills | Status: DC | PRN

## 2021-08-25 NOTE — Patient Instructions (Signed)
Medication Instructions:  Your Physician recommend you continue on your current medication as directed.    *If you need a refill on your cardiac medications before your next appointment, please call your pharmacy*   Follow-Up: At [facility], you and your health needs are our priority.  As part of our continuing mission to provide you with exceptional heart care, we have created designated Provider Care Teams.  These Care Teams include your primary Cardiologist (physician) and Advanced Practice Providers (APPs -  Physician Assistants and Nurse Practitioners) who all work together to provide you with the care you need, when you need it.  We recommend signing up for the patient portal called "MyChart".  Sign up information is provided on this After Visit Summary.  MyChart is used to connect with patients for Virtual Visits (Telemedicine).  Patients are able to view lab/test results, encounter notes, upcoming appointments, etc.  Non-urgent messages can be sent to your provider as well.   To learn more about what you can do with MyChart, go to [email or web address].    Your next appointment:   1 year(s)  The format for your next appointment:   In Person  Provider:   Minus Breeding, MD

## 2021-08-30 ENCOUNTER — Ambulatory Visit: Payer: HMO | Admitting: Neurology

## 2021-08-30 DIAGNOSIS — G245 Blepharospasm: Secondary | ICD-10-CM | POA: Diagnosis not present

## 2021-08-30 DIAGNOSIS — H26491 Other secondary cataract, right eye: Secondary | ICD-10-CM | POA: Diagnosis not present

## 2021-08-30 DIAGNOSIS — Z961 Presence of intraocular lens: Secondary | ICD-10-CM | POA: Diagnosis not present

## 2021-08-30 DIAGNOSIS — H18413 Arcus senilis, bilateral: Secondary | ICD-10-CM | POA: Diagnosis not present

## 2021-09-02 ENCOUNTER — Ambulatory Visit (INDEPENDENT_AMBULATORY_CARE_PROVIDER_SITE_OTHER): Payer: HMO | Admitting: *Deleted

## 2021-09-02 DIAGNOSIS — J8410 Pulmonary fibrosis, unspecified: Secondary | ICD-10-CM

## 2021-09-02 DIAGNOSIS — E1151 Type 2 diabetes mellitus with diabetic peripheral angiopathy without gangrene: Secondary | ICD-10-CM

## 2021-09-02 NOTE — Chronic Care Management (AMB) (Signed)
Chronic Care Management   CCM RN Visit Note  09/02/2021 Name: Todd [name] MRN: [id number] DOB: [birth year]  Subjective: Todd [name] is a 81 y.o. year old male who is a primary care patient of Burns, [name], MD. The care management team was consulted for assistance with disease management and care coordination needs.    Engaged with patient and his spouse Todd [name], on [facility] DPR by telephone for follow up visit in response to provider referral for case management and/or care coordination services.   Consent to Services:  The patient was given information about Chronic Care Management services, agreed to services, and gave verbal consent prior to initiation of services.  Please see initial visit note for detailed documentation.  Patient agreed to services and verbal consent obtained.   Assessment: Review of patient past medical history, allergies, medications, health status, including review of consultants reports, laboratory and other test data, was performed as part of comprehensive evaluation and provision of chronic care management services.   SDOH (Social Determinants of Health) assessments and interventions performed:  SDOH Interventions    Flowsheet Row Most Recent Value  SDOH Interventions   Food Insecurity Interventions Intervention Not Indicated  [Continues to deny food insecurity]  Transportation Interventions Intervention Not Indicated  [patient continues to drive self]      CCM Care Plan Allergies  Allergen Reactions   Amlodipine Besylate     Rash Because of a history of documented adverse serious drug reaction;Medi Alert bracelet  is recommended   Ivp Dye [Iodinated Contrast Media]     Rash Because of a history of documented adverse serious drug reaction;Medi Alert bracelet  is recommended   Outpatient Encounter Medications as of 09/02/2021  Medication Sig   albuterol (VENTOLIN HFA) 108 (90 Base) MCG/ACT inhaler Inhale 2 puffs into the lungs every 6 (six) hours as  needed for wheezing or shortness of breath.   benzonatate (TESSALON) 100 MG capsule TAKE 2 CAPSULES BY MOUTH 3 TIMES DAILY AS NEEDED FOR COUGH.   cholecalciferol (VITAMIN D3) 25 MCG (1000 UNIT) tablet Take 1,000 Units by mouth daily.   ELDERBERRY PO Take by mouth daily.   famotidine (PEPCID) 40 MG tablet Take 1 tablet (40 mg total) by mouth daily.   fluticasone (FLONASE) 50 MCG/ACT nasal spray Place 2 sprays into both nostrils daily.   glipiZIDE (GLUCOTROL) 5 MG tablet Take 1 tablet (5 mg total) by mouth 2 (two) times daily before a meal.   HYDROcodone bit-homatropine (HYCODAN) 5-1.5 MG/5ML syrup Take 5 mLs by mouth every 6 (six) hours as needed for cough.   levocetirizine (XYZAL) 5 MG tablet Take 1 tablet (5 mg total) by mouth every evening.   metFORMIN (GLUCOPHAGE-XR) 500 MG 24 hr tablet TAKE 2 TABLETS BY MOUTH TWICE A DAY   metoprolol tartrate (LOPRESSOR) 50 MG tablet Take 1 tablet (50 mg total) by mouth 2 (two) times daily.   nitroGLYCERIN (NITROSTAT) 0.4 MG SL tablet Place 1 tablet (0.4 mg total) under the tongue every 5 (five) minutes as needed for chest pain.   Omega-3 Fatty Acids (FISH OIL PO) Take 1 tablet by mouth daily.   omeprazole (PRILOSEC) 40 MG capsule Take 1 capsule (40 mg total) by mouth daily.   ONETOUCH DELICA LANCETS 26R MISC TEST BLOOD SUGAR ONCE DAILY   ONETOUCH VERIO test strip USE UP TO 4 TIMES A DAY AS DIRECTED   Pirfenidone (ESBRIET) 267 MG TABS Take 1 tab three times daily for 7 days, then 2 tabs three times  daily for 7 days, then 3 tabs three times daily thereafter.   [START ON 09/19/2021] Pirfenidone (ESBRIET) 267 MG TABS Take 3 tablets (801 mg total) by mouth with breakfast, with lunch, and with evening meal.   rivaroxaban (XARELTO) 20 MG TABS tablet Take 1 tablet (20 mg total) by mouth daily with supper.   simvastatin (ZOCOR) 40 MG tablet Take 1 tablet (40 mg total) by mouth every evening.   triamcinolone cream (KENALOG) 0.1 %    No facility-administered encounter  medications on file as of 09/02/2021.   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   Sinus drainage 07/28/2020   Aortic atherosclerosis ([place]) 05/07/2020   GERD (gastroesophageal reflux disease) 05/06/2019   Pulmonary fibrosis ([place]) 05/05/2019   Asbestosis ([place]) 05/05/2019   Ventral hernia without obstruction or gangrene 11/91/4782   Umbilical hernia without obstruction or gangrene 10/31/2018   Eczema 10/29/2017   Hives 08/13/2017   Cough 12/04/2016   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   Hemorrhoids, internal, with bleeding 01/15/2015   Bursitis of left shoulder 01/14/2015   Type 2 diabetes, controlled, with peripheral circulatory disorder ([place]) 12/05/2013   PVD (peripheral vascular disease) ([place]) 11/12/2013   Atrial fibrillation ([place]) 11/03/2013   Carotid artery disease ([place]) 12/20/2009   OVERWEIGHT/OBESITY 11/17/2008   Hyperlipidemia 07/20/2008   Essential hypertension 07/20/2008   Coronary atherosclerosis 07/20/2008   ALLERGIC RHINITIS 07/20/2008   NEPHROLITHIASIS, HX OF 07/20/2008   Conditions to be addressed/monitored:  COPD and DMII  Care Plan : RN Care Manager Plan of Care  Updates made by Knox Royalty, RN since 09/02/2021 12:00 AM     Problem: Chronic Disease Management Needs   Priority: High     Long-Range Goal: Development of Plan of Care for long term chronic disease management   Start Date: 07/28/2021  Expected End Date: 07/28/2022  Priority: High  Note:   Current Barriers:  Chronic Disease Management support and education needs related to COPD/ pulmonary fibrosis and DMII 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   Interventions: 1:1 collaboration with primary care provider regarding development and update of comprehensive plan of care as evidenced by provider attestation and co-signature Inter-disciplinary care team collaboration (see longitudinal plan of care) Evaluation of current treatment plan related to  self management and patient's adherence to plan as established by provider SDOH updated- no concerns identified Pain assessment updated: patient denies acute/ chronic pain Falls assessment updated: continues to deny falls; does not use assistive devices Confirmed no current medications concerns: discussed/ reviewed recently added Esbriet-- patient confirms he is taking  COPD/ Pulmonary Fibrosis: (Status: Goal on Track (progressing): YES.) Long Term Goal  Advised patient to self assesses COPD action plan zone and make appointment with provider if in the yellow zone for 48 hours without improvement Provided education about and advised patient to utilize infection prevention strategies to reduce risk of respiratory infection Discussed the importance of adequate rest and  management of fatigue with COPD Reviewed recent pulmonary office visit 08/24/20-- continues to report ongoing severe cough, states "they have given me nothing to help, I have to live with it, I guess;" confirms he is taking Esbriet as prescribed-- although he and his spouse report difference in opinion regarding when patient should have increased Esbriet dosing: patient started higher dose (week # 2) today-- spouse thinks he should have waited to start until tomorrow; patient denies side effects from [place], but wonders if this new medication may be increasing his blood sugars: he has  noticed a slight increase in fasting blood sugars since he began taking: looked up side effects form Esbriet-- could not find evidence that increased blood sugars are a typical side effect; encouraged to discuss with pulmonary provider at next office visit   Diabetes:  (Status: Goal on Track (progressing): YES.) Long Term Goal   Lab Results  Component Value Date   HGBA1C 7.5 (H) 08/03/2021  Reviewed prescribed diet with patient low carbohydrate, low sugar, low salt; Counseled on importance of regular laboratory monitoring as prescribed;        Discussed plans with patient for ongoing care management follow up and provided patient with direct contact information for care management team;      Reviewed scheduled/upcoming provider appointments including: 10/1321- pulmonary provider;  02/07/22- PCP ;         Review of patient status, including review of consultants reports, relevant laboratory and other test results, and medications completed;       Confirmed patient continues to monitor blood sugars at home 3 times per week fasting; reports general ranges consistently between 120-140; states blood sugars had been much less over last few weeks prior to starting [place], between 108-113; states they have now increased back to 120-140's, which was what patient originally reported at initial visit as his baseline trends at home Confirmed no recent changes to diabetes medications, endorses adherence to all prescribed medications Reinforced previously provided education around diabetic diet: patient continues trying to follow; reports intermittent decreased appetite, states "does the best" he can to follow dietary recommendations   Patient Goals/Self-Care Activities: As evidenced by review of EHR, collaboration with care team, and patient reporting during CCM RN CM outreach,  Patient Todd [name] will: Take medications as prescribed Attend all scheduled provider appointments Call pharmacy for medication  refills Call provider office for new concerns or questions Continue to check fasting (first thing in the morning, before eating) blood sugars at home every other day Continue to follow heart healthy, low salt, carbohydrate-modified, low sugar diet Keep up the great work preventing falls    Plan: Telephone follow up appointment with care management team member scheduled for:  Thursday, November 03, 2021 at 3:00 pm The patient has been provided with contact information for the care management team and has been advised to call with any health related questions or concerns  Oneta Rack, RN, BSN, [place] [phone]: direct office

## 2021-09-02 NOTE — Patient Instructions (Addendum)
Visit Information  Mutasim, and [name], thank you for taking time to talk with me today. Please don't hesitate to contact me if I can be of assistance to you before our next scheduled telephone appointment.  Below are the goals we discussed today:  Patient Self-Care Activities: Patient Todd [name] will: Take medications as prescribed Attend all scheduled provider appointments Call pharmacy for medication refills Call provider office for new concerns or questions Continue to check fasting (first thing in the morning, before eating) blood sugars at home every other day Continue to follow heart healthy, low salt, carbohydrate-modified, low sugar diet Keep up the great work preventing falls  Our next scheduled telephone follow up visit/ appointment with care management team member is scheduled on:  Thursday, November 03, 2021 at 3:00 pm  If you need to cancel or re-schedule our visit, please call [phone] and our care guide team will be happy to assist you.   I look forward to hearing about your progress.   Oneta Rack, RN, BSN, [place] [phone]: direct office  If you are experiencing a Mental Health or [place] or need someone to talk to, please  call the Suicide and Crisis Lifeline: 988 call the [place] National Suicide Prevention Lifeline: 607-749-7311 or TTY: (415) 334-0096 TTY 731 511 7207) to talk to a trained counselor call 1-800-273-TALK (toll free, 24 hour hotline) go to [facility] [address], [place] [phone]) call 911   Patient verbalizes understanding of instructions and care plan provided today and agrees to view in [place]. Active MyChart status confirmed with patient/ spouse  Cough, Adult Coughing is a reflex that clears your throat and your airways (respiratory system). Coughing helps to heal and protect your lungs. It is normal to cough  occasionally, but a cough that happens with other symptoms or lasts a long time may be a sign of a condition that needs treatment. An acute cough may only last 2-3 weeks, while a chronic cough may last 8 or more weeks. Coughing is commonly caused by: Infection of the respiratory systemby viruses or bacteria. Breathing in substances that irritate your lungs. Allergies. Asthma. Mucus that runs down the back of your throat (postnasal drip). Smoking. Acid backing up from the stomach into the esophagus (gastroesophageal reflux). Certain medicines. Chronic lung problems. Other medical conditions such as heart failure or a blood clot in the lung (pulmonary embolism). Follow these instructions at home: Medicines Take over-the-counter and prescription medicines only as told by your health care provider. Talk with your health care provider before you take a cough suppressant medicine. Lifestyle  Avoid cigarette smoke. Do not use any products that contain nicotine or tobacco, such as cigarettes, e-cigarettes, and chewing tobacco. If you need help quitting, ask your health care provider. Drink enough fluid to keep your urine pale yellow. Avoid caffeine. Do not drink alcohol if your health care provider tells you not to drink. General instructions  Pay close attention to changes in your cough. Tell your health care provider about them. Always cover your mouth when you cough. Avoid things that make you cough, such as perfume, candles, cleaning products, or campfire or tobacco smoke. If the air is dry, use a cool mist vaporizer or humidifier in your bedroom or your home to help loosen secretions. If your cough is worse at night, try to sleep in a semi-upright position. Rest as needed. Keep all follow-up visits as told by your health care  provider. This is important. Contact a health care provider if you: Have new symptoms. Cough up pus. Have a cough that does not get better after 2-3 weeks or gets  worse. Cannot control your cough with cough suppressant medicines and you are losing sleep. Have pain that gets worse or pain that is not helped with medicine. Have a fever. Have unexplained weight loss. Have night sweats. Get help right away if: You cough up blood. You have difficulty breathing. Your heartbeat is very fast. These symptoms may represent a serious problem that is an emergency. Do not wait to see if the symptoms will go away. Get medical help right away. Call your local emergency services (911 in the [place] Do not drive yourself to the hospital. Summary Coughing is a reflex that clears your throat and your airways. It is normal to cough occasionally, but a cough that happens with other symptoms or lasts a long time may be a sign of a condition that needs treatment. Take over-the-counter and prescription medicines only as told by your health care provider. Always cover your mouth when you cough. Contact a health care provider if you have new symptoms or a cough that does not get better after 2-3 weeks or gets worse. This information is not intended to replace advice given to you by your health care provider. Make sure you discuss any questions you have with your health care provider. Document Revised: 08/26/2018 Document Reviewed: 08/26/2018 Elsevier Patient Education  [place].

## 2021-09-06 ENCOUNTER — Ambulatory Visit: Payer: HMO | Admitting: Pulmonary Disease

## 2021-09-09 DIAGNOSIS — L570 Actinic keratosis: Secondary | ICD-10-CM | POA: Diagnosis not present

## 2021-09-09 DIAGNOSIS — B078 Other viral warts: Secondary | ICD-10-CM | POA: Diagnosis not present

## 2021-09-09 DIAGNOSIS — L820 Inflamed seborrheic keratosis: Secondary | ICD-10-CM | POA: Diagnosis not present

## 2021-09-09 DIAGNOSIS — X32XXXD Exposure to sunlight, subsequent encounter: Secondary | ICD-10-CM | POA: Diagnosis not present

## 2021-09-20 DIAGNOSIS — Z7984 Long term (current) use of oral hypoglycemic drugs: Secondary | ICD-10-CM | POA: Diagnosis not present

## 2021-09-20 DIAGNOSIS — E1169 Type 2 diabetes mellitus with other specified complication: Secondary | ICD-10-CM | POA: Diagnosis not present

## 2021-09-20 DIAGNOSIS — J449 Chronic obstructive pulmonary disease, unspecified: Secondary | ICD-10-CM

## 2021-09-26 ENCOUNTER — Other Ambulatory Visit: Payer: Self-pay | Admitting: Internal Medicine

## 2021-10-06 ENCOUNTER — Telehealth: Payer: Self-pay

## 2021-10-06 NOTE — Chronic Care Management (AMB) (Signed)
Chronic Care Management Pharmacy Assistant   Name: GERSHON DEWAN  MRN: [id number] DOB: [birth year]  Todd [name] is an 81 y.o. year old male who presents for his follow-up CCM visit with the clinical pharmacist.  Reason for Encounter: Disease State-General    Recent office visits:  08/03/21 Binnie Rail, MD-PCP (Atherosclerosis of native coronary artery of native heart without angina pectoris) Blood work was ordered.  Medications changes include :   decrease omeprazole to once a day.  Start pepcid 40 mg in evening. After a couple of weeks take the omeprazole every other day and slowly taper.    Recent consult visits:  08/25/21 Minus Breeding, MD-Cardiology (Atrial Fibrillation) No orders or med changes  08/24/21 Mannam, Hart Robinsons, MD-Pulmonary Disease (Pulmonary fibrosis) Orders: Hepatic function panel, Med changes:   Hospital visits:  None since last coordination call  Medications: Outpatient Encounter Medications as of 10/06/2021  Medication Sig   albuterol (VENTOLIN HFA) 108 (90 Base) MCG/ACT inhaler Inhale 2 puffs into the lungs every 6 (six) hours as needed for wheezing or shortness of breath.   benzonatate (TESSALON) 100 MG capsule TAKE 2 CAPSULES BY MOUTH 3 TIMES DAILY AS NEEDED FOR COUGH.   cholecalciferol (VITAMIN D3) 25 MCG (1000 UNIT) tablet Take 1,000 Units by mouth daily.   ELDERBERRY PO Take by mouth daily.   famotidine (PEPCID) 40 MG tablet Take 1 tablet (40 mg total) by mouth daily.   fluticasone (FLONASE) 50 MCG/ACT nasal spray Place 2 sprays into both nostrils daily.   glipiZIDE (GLUCOTROL) 5 MG tablet Take 1 tablet (5 mg total) by mouth 2 (two) times daily before a meal.   HYDROcodone bit-homatropine (HYCODAN) 5-1.5 MG/5ML syrup Take 5 mLs by mouth every 6 (six) hours as needed for cough.   levocetirizine (XYZAL) 5 MG tablet TAKE 1 TABLET BY MOUTH EVERY EVENING.   metFORMIN (GLUCOPHAGE-XR) 500 MG 24 hr tablet TAKE 2 TABLETS BY MOUTH TWICE A DAY   metoprolol  tartrate (LOPRESSOR) 50 MG tablet Take 1 tablet (50 mg total) by mouth 2 (two) times daily.   nitroGLYCERIN (NITROSTAT) 0.4 MG SL tablet Place 1 tablet (0.4 mg total) under the tongue every 5 (five) minutes as needed for chest pain.   Omega-3 Fatty Acids (FISH OIL PO) Take 1 tablet by mouth daily.   omeprazole (PRILOSEC) 40 MG capsule Take 1 capsule (40 mg total) by mouth daily.   ONETOUCH DELICA LANCETS 99991111 MISC TEST BLOOD SUGAR ONCE DAILY   ONETOUCH VERIO test strip USE UP TO 4 TIMES A DAY AS DIRECTED   Pirfenidone (ESBRIET) 267 MG TABS Take 1 tab three times daily for 7 days, then 2 tabs three times daily for 7 days, then 3 tabs three times daily thereafter.   Pirfenidone (ESBRIET) 267 MG TABS Take 3 tablets (801 mg total) by mouth with breakfast, with lunch, and with evening meal.   rivaroxaban (XARELTO) 20 MG TABS tablet Take 1 tablet (20 mg total) by mouth daily with supper.   simvastatin (ZOCOR) 40 MG tablet Take 1 tablet (40 mg total) by mouth every evening.   triamcinolone cream (KENALOG) 0.1 %    No facility-administered encounter medications on file as of 10/06/2021.   Have you had any problems recently with your health?Spoke with patient wife who states that patient still has a persistent cough and will be going this week to the doctors to see if it may be coming from allergies  Have you had any problems with your pharmacy?She  states that patient does not have any problems with getting medications from the pharmacy  What issues or side effects are you having with your medications? She states that patient is not having any side effects that she knows of  What would you like me to pass along to Rodena Goldmann for them to help you with? She states that the patient is doing good  What can we do to take care of you better? She states that patient does not need anything at this time  Care Gaps: Colonoscopy-NA Diabetic Foot Exam-05/23/21 Ophthalmology-NA Dexa Scan - NA Annual Well  Visit -  Micro albumin-05/07/20 Hemoglobin A1c- 08/03/21  Star Rating Drugs: Simvastatin 40 mg-last fill 09/07/21 90 ds Metformin 500 mg-last fill 08/30/21 30 ds  Ethelene Hal Clinical Pharmacist Assistant [phone]

## 2021-10-19 DIAGNOSIS — R0981 Nasal congestion: Secondary | ICD-10-CM | POA: Diagnosis not present

## 2021-10-19 DIAGNOSIS — J309 Allergic rhinitis, unspecified: Secondary | ICD-10-CM | POA: Diagnosis not present

## 2021-10-19 DIAGNOSIS — J329 Chronic sinusitis, unspecified: Secondary | ICD-10-CM | POA: Diagnosis not present

## 2021-10-19 DIAGNOSIS — J301 Allergic rhinitis due to pollen: Secondary | ICD-10-CM | POA: Diagnosis not present

## 2021-10-19 DIAGNOSIS — R059 Cough, unspecified: Secondary | ICD-10-CM | POA: Diagnosis not present

## 2021-10-29 ENCOUNTER — Other Ambulatory Visit: Payer: Self-pay | Admitting: Internal Medicine

## 2021-10-31 ENCOUNTER — Other Ambulatory Visit: Payer: Self-pay

## 2021-10-31 ENCOUNTER — Encounter: Payer: Self-pay | Admitting: Pulmonary Disease

## 2021-10-31 ENCOUNTER — Ambulatory Visit (INDEPENDENT_AMBULATORY_CARE_PROVIDER_SITE_OTHER): Payer: HMO | Admitting: Pulmonary Disease

## 2021-10-31 VITALS — BP 138/80 | HR 85 | Temp 98.2°F | Ht 68.0 in | Wt 175.4 lb

## 2021-10-31 DIAGNOSIS — Z5181 Encounter for therapeutic drug level monitoring: Secondary | ICD-10-CM | POA: Diagnosis not present

## 2021-10-31 DIAGNOSIS — J8410 Pulmonary fibrosis, unspecified: Secondary | ICD-10-CM | POA: Diagnosis not present

## 2021-10-31 LAB — COMPREHENSIVE METABOLIC PANEL
ALT: 12 U/L (ref 0–53)
AST: 19 U/L (ref 0–37)
Albumin: 4 g/dL (ref 3.5–5.2)
Alkaline Phosphatase: 73 U/L (ref 39–117)
BUN: 24 mg/dL — ABNORMAL HIGH (ref 6–23)
CO2: 27 mEq/L (ref 19–32)
Calcium: 9.8 mg/dL (ref 8.4–10.5)
Chloride: 105 mEq/L (ref 96–112)
Creatinine, Ser: 0.86 mg/dL (ref 0.40–1.50)
GFR: 82.79 mL/min (ref >60.00)
Glucose, Bld: 184 mg/dL — ABNORMAL HIGH (ref 70–99)
Potassium: 4.6 mEq/L (ref 3.5–5.1)
Sodium: 138 mEq/L (ref 135–145)
Total Bilirubin: 0.5 mg/dL (ref 0.2–1.2)
Total Protein: 7.1 g/dL (ref 6.0–8.3)

## 2021-10-31 MED ORDER — HYDROCODONE BIT-HOMATROP MBR 5-1.5 MG/5ML PO SOLN: 5.0000 mL | Freq: Four times a day (QID) | ORAL | 0 refills | Status: DC | PRN

## 2021-10-31 NOTE — Progress Notes (Signed)
? ?      Todd [name]    [id number]    Dec 27, 1940 ? ?Primary Care Physician:Burns, [name], MD ? ?Referring Physician: Binnie Rail, MD ?[place],  [place] [address] ? 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 ?HPI: ?Todd [name] is a 81 year old with history of allergies, rhinitis, atrial fibrillation, hypertension, hyperlipidemia, diabetes, GERD ?Follow-up for stable asbestosis, chronic cough, COVID-19 infection in September 2021 ? ?Developed COVID-19 in September 2021.  He was sick for about a week.  Treated with outpatient monoclonal antibody therapy and did not require hospitalization ?His cough has worsened since his COVID-19 infection.  He was put on prednisone for a month with slow taper with improvement in symptoms ?Continues to have persistent cough.  ENT examination in 2022 was normal ? ?Pets: None ?Occupation: Retired, used to work in [employer] ?Exposures: Significant exposure to asbestos in previous line of work ?Smoking history: None ? ?Interim history ?Started Esbriet in January 2023.  So far he is tolerating it well except for occasional diarrhea ? ?Complains of persistent cough, intermittent hoarseness of voice ?Cough improves with Hycodan.  Tessalon does not help ? ?Outpatient Encounter Medications as of 10/31/2021  ?Medication Sig  ? albuterol (VENTOLIN HFA) 108 (90 Base) MCG/ACT inhaler Inhale 2 puffs into the lungs every 6 (six) hours as needed for wheezing or shortness of breath.  ? cholecalciferol (VITAMIN D3) 25 MCG (1000 UNIT) tablet Take 1,000 Units by mouth daily.  ? ELDERBERRY PO Take by mouth daily.  ? famotidine (PEPCID) 40 MG tablet Take 1 tablet (40 mg total) by mouth daily.  ? fluticasone (FLONASE) 50 MCG/ACT nasal spray Place 2 sprays into both nostrils daily.  ? glipiZIDE (GLUCOTROL) 5 MG tablet Take 1 tablet (5 mg total) by mouth 2 (two) times daily before a meal.  ? HYDROcodone bit-homatropine  (HYCODAN) 5-1.5 MG/5ML syrup Take 5 mLs by mouth every 6 (six) hours as needed for cough.  ? levocetirizine (XYZAL) 5 MG tablet TAKE 1 TABLET BY MOUTH EVERY EVENING.  ? metFORMIN (GLUCOPHAGE-XR) 500 MG 24 hr tablet TAKE 2 TABLETS BY MOUTH TWICE A DAY  ? metoprolol tartrate (LOPRESSOR) 50 MG tablet Take 1 tablet (50 mg total) by mouth 2 (two) times daily.  ? nitroGLYCERIN (NITROSTAT) 0.4 MG SL tablet Place 1 tablet (0.4 mg total) under the tongue every 5 (five) minutes as needed for chest pain.  ? Omega-3 Fatty Acids (FISH OIL PO) Take 1 tablet by mouth daily.  ? omeprazole (PRILOSEC) 40 MG capsule Take 1 capsule (40 mg total) by mouth daily.  ? ONETOUCH DELICA LANCETS 69C MISC TEST BLOOD SUGAR ONCE DAILY  ? ONETOUCH VERIO test strip USE UP TO 4 TIMES A DAY AS DIRECTED  ? Pirfenidone (ESBRIET) 267 MG TABS Take 1 tab three times daily for 7 days, then 2 tabs three times daily for 7 days, then 3 tabs three times daily thereafter.  ? Pirfenidone (ESBRIET) 267 MG TABS Take 3 tablets (801 mg total) by mouth with breakfast, with lunch, and with evening meal.  ? rivaroxaban (XARELTO) 20 MG TABS tablet Take 1 tablet (20 mg total) by mouth daily with supper.  ? simvastatin (ZOCOR) 40 MG tablet Take 1 tablet (40 mg total) by mouth every evening.  ? triamcinolone cream (KENALOG) 0.1 %   ? [DISCONTINUED] benzonatate (TESSALON) 100 MG capsule TAKE 2 CAPSULES BY MOUTH 3 TIMES DAILY AS NEEDED FOR COUGH.  ? ?  No facility-administered encounter medications on file as of 10/31/2021.  ? ?Physical Exam: ?Blood pressure 138/80, pulse 85, temperature 98.2 ?F (36.8 ?C), temperature source Oral, height '5\' 8"'$  (1.727 m), weight 175 lb 6.4 oz (79.6 kg), SpO2 97 %. ?Gen:      No acute distress ?HEENT:  EOMI, sclera anicteric ?Neck:     No masses; no thyromegaly ?Lungs:    Clear to auscultation bilaterally; normal respiratory effort ?CV:         Regular rate and rhythm; no murmurs ?Abd:      + bowel sounds; soft, non-tender; no palpable masses, no  distension ?Ext:    No edema; adequate peripheral perfusion ?Skin:      Warm and dry; no rash ?Neuro: alert and oriented x 3 ?Psych: normal mood and affect  ? ?Data Reviewed: ?Imaging ?Chest x-ray 12/04/16-stable interstitial opacities ?CT abdomen 11/20/13-mild reticular opacities at the bases ?CT high-resolution 01/02/17- subpleural reticulation, mild traction bronchiectasis, possible early honeycombing with basal gradient. ?CT high-resolution 01/09/18-probable UIP fibrosis.  Slightly worse compared to 2018 ?High-res CT 03/21/2019-probable UIP ?High-res CT 03/30/2020-unchanged probable UIP fibrosis ?High-res CT 09/02/2020- unchanged probable UIP fibrosis ?High-res CT 06/30/2021-slight worsening of fibrosis and probable UIP pattern ?I have reviewed the images personally ? ?PFTs  ?03/20/17- FVC 2.74 (72%], FEV1 2.38 (87%), F/F 87, TLC 62%, DLCO 15.89 (56%) ? ?01/15/2018-FVC 2.74 [73%], FEV1 2.33 [86%], F/F 85, TLC 71%, DLCO 13.13 (46%) ? ?07/23/2018-FVC 2.51 [69%), FEV1 2.10 [78%), DLCO 15.16 (53%) ?Mild restriction, moderate-severe diffusion defect. ? ?04/02/2019 ?FVC 2.70 [72%], FEV1 2.18 [82%], F/F 81, DLCO 13.8 [60%] ?Moderate diffusion defect ? ?08/31/2020 ?FVC 2.52 [65%], FEV1 2.25 [82%], F/F 89, TLC 4.64 [69%], DLCO 20.59 [88%] ?Mild restriction.  Improvement in diffusion capacity ? ?08/24/2021 ?FVC 2.03 [53%], FEV1 1.53 [56%], F/F 75, TLC 2.78 [41%], DLCO 13.45 [58%] ?Severe restriction, moderate diffusion defect ? ?FENO 12/18/16- 12 ? ?Labs ?ILD serologies 01/09/17- all negative except for mild elevation in SCL 75. ? ?Assessment:  ?Progressive pulmonary fibrosis, asbestos exposure. 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  PFTs today show significant worsening of restriction and diffusion impairment. ? ?Started Esbriet in January 2023 ?Monitor LFTs ? ?Chronic  cough ?Likely secondary to pulmonary fibrosis ?He has been treated adequately for GERD ?He has some improvement with codeine cough syrup.  He reports that he felt better after albuterol inhaler which was given with the PFTs today.  We will give him a prescription for albuterol as needed ? ?Consider thalidomide for treatment of IPF related cough he continues to have persistent symptoms.  But data for those is not strong. ? ?Post COVID-19 ?Has made a good recovery.  He had mild symptoms which did not require hospitalization ? ?Health maintenance ?01/15/2015-Prevnar 13 ?05/05/2010-Pneumovax ? ?Plan/Recommendations: ?Continue Esbriet ?Follow-up hepatic panel ?Hycodan for cough ? ?Marshell Garfinkel MD ?[facility] ?10/31/2021, 10:16 AM ? ?CC: Binnie Rail, MD ? ? ?

## 2021-10-31 NOTE — Patient Instructions (Signed)
We will get comprehensive metabolic panel today for monitoring and check monthly labs ?Continue Esbriet ?I will call in a refill for Hycodan cough syrup ? ?Follow-up in 3 months ? ? ?

## 2021-10-31 NOTE — Addendum Note (Signed)
Addended by: Elton Sin on: 10/31/2021 11:23 AM ? ? Modules accepted: Orders ? ?

## 2021-11-03 ENCOUNTER — Ambulatory Visit (INDEPENDENT_AMBULATORY_CARE_PROVIDER_SITE_OTHER): Payer: HMO | Admitting: *Deleted

## 2021-11-03 DIAGNOSIS — E1151 Type 2 diabetes mellitus with diabetic peripheral angiopathy without gangrene: Secondary | ICD-10-CM

## 2021-11-03 DIAGNOSIS — J8410 Pulmonary fibrosis, unspecified: Secondary | ICD-10-CM

## 2021-11-03 NOTE — Chronic Care Management (AMB) (Signed)
Chronic Care Management   CCM RN Visit Note  11/03/2021 Name: Todd [name] MRN: [id number] DOB: 12/30/1940  Subjective: Todd [name] is a 81 y.o. year old male who is a primary care patient of Burns, [name], MD. The care management team was consulted for assistance with disease management and care coordination needs.    Engaged with patient by telephone for follow up visit in response to provider referral for case management and/or care coordination services.   Consent to Services:  The patient was given information about Chronic Care Management services, agreed to services, and gave verbal consent prior to initiation of services.  Please see initial visit note for detailed documentation.  Patient agreed to services and verbal consent obtained.   Assessment: Review of patient past medical history, allergies, medications, health status, including review of consultants reports, laboratory and other test data, was performed as part of comprehensive evaluation and provision of chronic care management services.   SDOH (Social Determinants of Health) assessments and interventions performed:  SDOH Interventions    Flowsheet Row Most Recent Value  SDOH Interventions   Food Insecurity Interventions Intervention Not Indicated  [Continues to deny food insecurity]  Transportation Interventions Intervention Not Indicated  [Patient continues to drive self]      CCM Care Plan  Allergies  Allergen Reactions   Amlodipine Besylate     Rash Because of a history of documented adverse serious drug reaction;Medi Alert bracelet  is recommended   Ivp Dye [Iodinated Contrast Media]     Rash Because of a history of documented adverse serious drug reaction;Medi Alert bracelet  is recommended   Outpatient Encounter Medications as of 11/03/2021  Medication Sig   albuterol (VENTOLIN HFA) 108 (90 Base) MCG/ACT inhaler Inhale 2 puffs into the lungs every 6 (six) hours as needed for wheezing or shortness of  breath.   cholecalciferol (VITAMIN D3) 25 MCG (1000 UNIT) tablet Take 1,000 Units by mouth daily.   ELDERBERRY PO Take by mouth daily.   famotidine (PEPCID) 40 MG tablet Take 1 tablet (40 mg total) by mouth daily.   fluticasone (FLONASE) 50 MCG/ACT nasal spray Place 2 sprays into both nostrils daily.   glipiZIDE (GLUCOTROL) 5 MG tablet Take 1 tablet (5 mg total) by mouth 2 (two) times daily before a meal.   HYDROcodone bit-homatropine (HYCODAN) 5-1.5 MG/5ML syrup Take 5 mLs by mouth every 6 (six) hours as needed for cough.   levocetirizine (XYZAL) 5 MG tablet TAKE 1 TABLET BY MOUTH EVERY EVENING.   metFORMIN (GLUCOPHAGE-XR) 500 MG 24 hr tablet TAKE 2 TABLETS BY MOUTH TWICE A DAY   metoprolol tartrate (LOPRESSOR) 50 MG tablet Take 1 tablet (50 mg total) by mouth 2 (two) times daily.   nitroGLYCERIN (NITROSTAT) 0.4 MG SL tablet Place 1 tablet (0.4 mg total) under the tongue every 5 (five) minutes as needed for chest pain.   Omega-3 Fatty Acids (FISH OIL PO) Take 1 tablet by mouth daily.   omeprazole (PRILOSEC) 40 MG capsule Take 1 capsule (40 mg total) by mouth daily.   ONETOUCH DELICA LANCETS 99991111 MISC TEST BLOOD SUGAR ONCE DAILY   ONETOUCH VERIO test strip USE UP TO 4 TIMES A DAY AS DIRECTED   Pirfenidone (ESBRIET) 267 MG TABS Take 1 tab three times daily for 7 days, then 2 tabs three times daily for 7 days, then 3 tabs three times daily thereafter.   Pirfenidone (ESBRIET) 267 MG TABS Take 3 tablets (801 mg total) by mouth with breakfast,  with lunch, and with evening meal.   rivaroxaban (XARELTO) 20 MG TABS tablet Take 1 tablet (20 mg total) by mouth daily with supper.   simvastatin (ZOCOR) 40 MG tablet Take 1 tablet (40 mg total) by mouth every evening.   triamcinolone cream (KENALOG) 0.1 %    No facility-administered encounter medications on file as of 11/03/2021.   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   Sinus drainage 07/28/2020   Aortic atherosclerosis ([place]) 05/07/2020   GERD (gastroesophageal reflux disease) 05/06/2019   Pulmonary fibrosis ([place]) 05/05/2019   Asbestosis ([place]) 05/05/2019   Ventral hernia without obstruction or gangrene [id number]   Umbilical hernia without obstruction or gangrene 10/31/2018   Eczema 10/29/2017   Hives 08/13/2017   Cough 12/04/2016   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   Hemorrhoids, internal, with bleeding 01/15/2015   Bursitis of left shoulder 01/14/2015   Type 2 diabetes, controlled, with peripheral circulatory disorder ([place]) 12/05/2013   PVD (peripheral vascular disease) ([place]) 11/12/2013   Atrial fibrillation ([place]) 11/03/2013   Carotid artery disease ([place]) 12/20/2009   OVERWEIGHT/OBESITY 11/17/2008   Hyperlipidemia 07/20/2008   Essential hypertension 07/20/2008   Coronary atherosclerosis 07/20/2008   ALLERGIC RHINITIS 07/20/2008   NEPHROLITHIASIS, HX OF 07/20/2008   Conditions to be addressed/monitored:  COPD/ pulmonary fibrosis and DMII  Care Plan : RN Care Manager Plan of Care  Updates made by Knox Royalty, RN since 11/03/2021 12:00 AM     Problem: Chronic Disease Management Needs   Priority: High     Long-Range Goal: Development of Plan of Care for long term chronic disease management   Start Date: 07/28/2021  Expected End Date: 07/28/2022  Priority: High  Note:   Current Barriers:  Chronic Disease Management support and education needs related to COPD/ pulmonary fibrosis and DMII Ongoing chronic respiratory issues post- COVID infection- followed by pulmonary provider for pulmonary fibrosis/ COPD/ chronic severe cough post- COVID  RNCM Clinical Goal(s):  Patient will demonstrate ongoing health management independence as evidenced by adherence to  plan of care for DMII/ pulmonary fibrosis        through collaboration with RN Care manager, provider, and care team.   Interventions: 1:1 collaboration with primary care provider regarding development and update of comprehensive plan of care as evidenced by provider attestation and co-signature Inter-disciplinary care team collaboration (see longitudinal plan of care) Evaluation of current treatment plan related to  self management and patient's adherence to plan as established by provider 11/03/21: Review of patient status, including review of consultants reports, relevant laboratory and other test results, and medications completed SDOH updated: no new/ unmet concerns identified Pain assessment updated: continues to deny acute/ chronic pain Falls assessment updated: continues to deny new/ recent falls x 12 months- does not use assistive devices; reports "feels steady" on feet with ambulation;  positive reinforcement provided with encouragement to continue efforts at fall prevention; previously provided education around fall risks/ prevention reinforced Medications discussed: reports continues to self-manage medications, with minimal assistance from his spouse; denies current concerns/ issues/ questions around medications; endorses adherence to taking all medications as prescribed Reviewed upcoming scheduled provider appointments: 01/24/22- PCP; 01/24/22- pulmonary provider; patient's spouse confirms they are aware of all and have plans to attend as  scheduled Discussed plans with patient for ongoing care management follow up and provided patient with direct contact information for care management team     COPD/ Pulmonary Fibrosis: (Status: 11/03/21: Goal on Track (progressing): YES.) Long Term Goal  Advised patient to self assesses COPD action plan zone and make appointment with provider if in the yellow zone for 48 hours without improvement Advised patient to engage in light exercise as tolerated  3-5 days a week to aid in the the management of COPD Discussed the importance of adequate rest and management of fatigue with COPD Reviewed recent pulmonary office visit 10/31/21-- continues to report ongoing severe cough, confirmed PFT's completed; confirmed no changes to medications- patient's spouse reports patient was provided refill for cough medicine; taking, but "helps minimally"  Confirmed continues taking pirfenidone as instructed Confirmed continues occasionally using rescue inhaler, "about 2-4 times per week" Confirmed attended ENT provider office visit for evaluation of ongoing cough; spouse reports "they told us everything looked fine and good from an ENT standpoint"  Diabetes:  (Status: 11/03/21: Goal on Track (progressing): YES.) Long Term Goal   Lab Results  Component Value Date   HGBA1C 7.5 (H) 08/03/2021  Reviewed prescribed diet with patient low carbohydrate, low sugar, low salt; Counseled on importance of regular laboratory monitoring as prescribed;        Confirmed patient continues to monitor blood sugars at home 3 times per week fasting; reports general ranges consistently between 120-140; states blood sugars "all about the same;" he provides several recent values today, all between 120-140, with one isolated higher value of "151" Confirmed no low blood sugars; confirmed no signs/ symptoms hypoglycemia Reinforced previously provided education around signs/ symptoms hypoglycemia along with corresponding action plan; patient and spouse/ caregiver will benefit form ongoing reinforcement/ suppor/ and education Confirmed no recent changes to diabetes medications, endorses adherence to all prescribed medications Reinforced previously provided education around diabetic diet: patient continues trying to follow; reports intermittent decreased appetite, states "does the best" he can to follow dietary recommendations   Patient Goals/Self-Care Activities: As evidenced by review of EHR,  collaboration with care team, and patient reporting during CCM RN CM outreach,  Patient Meziah will: Take medications as prescribed Attend all scheduled provider appointments Call pharmacy for medication refills Call provider office for new concerns or questions Continue to check fasting (first thing in the morning, before eating) blood sugars at home every other day Continue to follow heart healthy, low salt, carbohydrate-modified, low sugar diet Keep up the great work preventing falls    Plan: Telephone follow up appointment with care management team member scheduled for: Monday, February 13, 2022 at 3:00 pm The patient has been provided with contact information for the care management team and has been advised to call with any health related questions or concerns  Oneta Rack, RN, BSN, [place] [phone]: direct office

## 2021-11-17 ENCOUNTER — Encounter: Payer: Self-pay | Admitting: Internal Medicine

## 2021-11-18 DIAGNOSIS — E1151 Type 2 diabetes mellitus with diabetic peripheral angiopathy without gangrene: Secondary | ICD-10-CM

## 2021-12-06 ENCOUNTER — Other Ambulatory Visit (INDEPENDENT_AMBULATORY_CARE_PROVIDER_SITE_OTHER): Payer: HMO

## 2021-12-06 DIAGNOSIS — Z5181 Encounter for therapeutic drug level monitoring: Secondary | ICD-10-CM

## 2021-12-06 DIAGNOSIS — Z961 Presence of intraocular lens: Secondary | ICD-10-CM | POA: Diagnosis not present

## 2021-12-06 DIAGNOSIS — G245 Blepharospasm: Secondary | ICD-10-CM | POA: Diagnosis not present

## 2021-12-06 DIAGNOSIS — H18413 Arcus senilis, bilateral: Secondary | ICD-10-CM | POA: Diagnosis not present

## 2021-12-06 DIAGNOSIS — H26491 Other secondary cataract, right eye: Secondary | ICD-10-CM | POA: Diagnosis not present

## 2021-12-06 LAB — COMPREHENSIVE METABOLIC PANEL
ALT: 13 U/L (ref 0–53)
AST: 20 U/L (ref 0–37)
Albumin: 4.1 g/dL (ref 3.5–5.2)
Alkaline Phosphatase: 78 U/L (ref 39–117)
BUN: 26 mg/dL — ABNORMAL HIGH (ref 6–23)
CO2: 28 mEq/L (ref 19–32)
Calcium: 9.6 mg/dL (ref 8.4–10.5)
Chloride: 104 mEq/L (ref 96–112)
Creatinine, Ser: 0.82 mg/dL (ref 0.40–1.50)
GFR: 83.93 mL/min (ref >60.00)
Glucose, Bld: 161 mg/dL — ABNORMAL HIGH (ref 70–99)
Potassium: 4.3 mEq/L (ref 3.5–5.1)
Sodium: 138 mEq/L (ref 135–145)
Total Bilirubin: 0.5 mg/dL (ref 0.2–1.2)
Total Protein: 7.3 g/dL (ref 6.0–8.3)

## 2021-12-06 LAB — HEPATIC FUNCTION PANEL
ALT: 13 U/L (ref 0–53)
AST: 20 U/L (ref 0–37)
Albumin: 4.1 g/dL (ref 3.5–5.2)
Alkaline Phosphatase: 78 U/L (ref 39–117)
Bilirubin, Direct: 0.1 mg/dL (ref 0.0–0.3)
Total Bilirubin: 0.5 mg/dL (ref 0.2–1.2)
Total Protein: 7.3 g/dL (ref 6.0–8.3)

## 2021-12-08 ENCOUNTER — Telehealth: Payer: Self-pay | Admitting: Pulmonary Disease

## 2021-12-08 NOTE — Telephone Encounter (Signed)
Called and spoke with pt's spouse Todd [name] who states that pt might be having a reaction to the [place]. She states when pt goes outside, he dresses like it is winter outside as he wears long sleeves, gloves on his hands, and also a hat that has a part on it that hangs down to help protect his neck. ? ?Todd [name] states that pt does not put any sunscreen on when he goes outside, just wears all the clothing.  She states for a little over a week now, pt has been having problems with his hands and arms being extremely swollen and states that they are also very itchy. Also states that his head has been very itchy as well. ? ?She said that this has been going on for a little over a week now. Pt has been using cortisone to see if that would help, which it does help some but does not completely alleviate all of the itching nor does it help with the swelling. ? ?Pt and Todd [name] want to know what might be able to be recommended due to all that is currently happening. ? ?Dr. Vaughan Browner, please advise. ?

## 2021-12-09 NOTE — Telephone Encounter (Signed)
Called and spoke with [name] letting her know the info per Dr. Vaughan Browner and she verbalized understanding. Appt for pt has been scheduled with Dr. Vaughan Browner 4/26. Nothing further needed. ?

## 2021-12-09 NOTE — Telephone Encounter (Signed)
Agree that this could be reaction from [place] as it does cause sun sensitivity.  Please stop Esbriet and make appointment at next available to discuss alternatives. ?

## 2021-12-14 ENCOUNTER — Ambulatory Visit: Payer: HMO | Admitting: Pulmonary Disease

## 2021-12-14 ENCOUNTER — Encounter: Payer: Self-pay | Admitting: Pulmonary Disease

## 2021-12-14 ENCOUNTER — Other Ambulatory Visit: Payer: Self-pay | Admitting: Cardiology

## 2021-12-14 VITALS — BP 138/80 | HR 92 | Temp 98.0°F | Ht 68.0 in | Wt 175.4 lb

## 2021-12-14 DIAGNOSIS — E785 Hyperlipidemia, unspecified: Secondary | ICD-10-CM

## 2021-12-14 DIAGNOSIS — I2510 Atherosclerotic heart disease of native coronary artery without angina pectoris: Secondary | ICD-10-CM

## 2021-12-14 DIAGNOSIS — Z5181 Encounter for therapeutic drug level monitoring: Secondary | ICD-10-CM

## 2021-12-14 DIAGNOSIS — I4891 Unspecified atrial fibrillation: Secondary | ICD-10-CM

## 2021-12-14 DIAGNOSIS — E118 Type 2 diabetes mellitus with unspecified complications: Secondary | ICD-10-CM

## 2021-12-14 DIAGNOSIS — I10 Essential (primary) hypertension: Secondary | ICD-10-CM

## 2021-12-14 DIAGNOSIS — J849 Interstitial pulmonary disease, unspecified: Secondary | ICD-10-CM

## 2021-12-14 NOTE — Progress Notes (Signed)
? ?      Todd [name]    [id number]    10-25-1940 ? ?Primary Care Physician:Burns, [name], MD ? ?Referring Physician: Binnie Rail, MD ?[place] [address] ? 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 ?HPI: ?Todd [name] is a 81 year old with history of allergies, rhinitis, atrial fibrillation, hypertension, hyperlipidemia, diabetes, GERD ?Follow-up for stable asbestosis, chronic cough, COVID-19 infection in September 2021 ? ?Developed COVID-19 in September 2021.  He was sick for about a week.  Treated with outpatient monoclonal antibody therapy and did not require hospitalization ?His cough has worsened since his COVID-19 infection.  He was put on prednisone for a month with slow taper with improvement in symptoms ?Continues to have persistent cough.  ENT examination in 2022 was normal ? ?Pets: None ?Occupation: Retired, used to work in [employer] ?Exposures: Significant exposure to asbestos in previous line of work ?Smoking history: None ? ?Interim history ?Started Esbriet in January 2023. ?This month he developed significant sunburn when he started going out into his garden and had to stop the Esbriet.  Symptoms improved after stopping Esbriet. ? ?Outpatient Encounter Medications as of 12/14/2021  ?Medication Sig  ? albuterol (VENTOLIN HFA) 108 (90 Base) MCG/ACT inhaler Inhale 2 puffs into the lungs every 6 (six) hours as needed for wheezing or shortness of breath.  ? cholecalciferol (VITAMIN D3) 25 MCG (1000 UNIT) tablet Take 1,000 Units by mouth daily.  ? ELDERBERRY PO Take by mouth daily.  ? famotidine (PEPCID) 40 MG tablet Take 1 tablet (40 mg total) by mouth daily.  ? fluticasone (FLONASE) 50 MCG/ACT nasal spray Place 2 sprays into both nostrils daily.  ? glipiZIDE (GLUCOTROL) 5 MG tablet Take 1 tablet (5 mg total) by mouth 2 (two) times daily before a meal.  ? HYDROcodone bit-homatropine (HYCODAN) 5-1.5 MG/5ML syrup Take  5 mLs by mouth every 6 (six) hours as needed for cough.  ? levocetirizine (XYZAL) 5 MG tablet TAKE 1 TABLET BY MOUTH EVERY EVENING.  ? metFORMIN (GLUCOPHAGE-XR) 500 MG 24 hr tablet TAKE 2 TABLETS BY MOUTH TWICE A DAY  ? metoprolol tartrate (LOPRESSOR) 50 MG tablet Take 1 tablet (50 mg total) by mouth 2 (two) times daily.  ? nitroGLYCERIN (NITROSTAT) 0.4 MG SL tablet Place 1 tablet (0.4 mg total) under the tongue every 5 (five) minutes as needed for chest pain.  ? Omega-3 Fatty Acids (FISH OIL PO) Take 1 tablet by mouth daily.  ? omeprazole (PRILOSEC) 40 MG capsule Take 1 capsule (40 mg total) by mouth daily.  ? ONETOUCH DELICA LANCETS 09G MISC TEST BLOOD SUGAR ONCE DAILY  ? ONETOUCH VERIO test strip USE UP TO 4 TIMES A DAY AS DIRECTED  ? rivaroxaban (XARELTO) 20 MG TABS tablet TAKE 1 TABLET BY MOUTH DAILY WITH SUPPER  ? simvastatin (ZOCOR) 40 MG tablet Take 1 tablet (40 mg total) by mouth every evening.  ? triamcinolone cream (KENALOG) 0.1 %   ? [DISCONTINUED] Pirfenidone (ESBRIET) 267 MG TABS Take 1 tab three times daily for 7 days, then 2 tabs three times daily for 7 days, then 3 tabs three times daily thereafter.  ? [DISCONTINUED] Pirfenidone (ESBRIET) 267 MG TABS Take 3 tablets (801 mg total) by mouth with breakfast, with lunch, and with evening meal.  ? ?No facility-administered encounter medications on file as of 12/14/2021.  ? ?Physical Exam: ?Blood pressure 138/80, pulse 92, temperature 98 ?F (36.7 ?C), height  $'5\' 8"'C$  (1.727 m), weight 175 lb 6.4 oz (79.6 kg), SpO2 93 %. ?Gen:      No acute distress ?HEENT:  EOMI, sclera anicteric ?Neck:     No masses; no thyromegaly ?Lungs:    Bibasal crackles ?CV:         Regular rate and rhythm; no murmurs ?Abd:      + bowel sounds; soft, non-tender; no palpable masses, no distension ?Ext:    No edema; adequate peripheral perfusion ?Skin:      Warm and dry; no rash ?Neuro: alert and oriented x 3 ?Psych: normal mood and affect  ? ?Data Reviewed: ?Imaging ?Chest x-ray  12/04/16-stable interstitial opacities ?CT abdomen 11/20/13-mild reticular opacities at the bases ?CT high-resolution 01/02/17- subpleural reticulation, mild traction bronchiectasis, possible early honeycombing with basal gradient. ?CT high-resolution 01/09/18-probable UIP fibrosis.  Slightly worse compared to 2018 ?High-res CT 03/21/2019-probable UIP ?High-res CT 03/30/2020-unchanged probable UIP fibrosis ?High-res CT 09/02/2020- unchanged probable UIP fibrosis ?High-res CT 06/30/2021-slight worsening of fibrosis and probable UIP pattern ?I have reviewed the images personally ? ?PFTs  ?03/20/17- FVC 2.74 (72%], FEV1 2.38 (87%), F/F 87, TLC 62%, DLCO 15.89 (56%) ? ?01/15/2018-FVC 2.74 [73%], FEV1 2.33 [86%], F/F 85, TLC 71%, DLCO 13.13 (46%) ? ?07/23/2018-FVC 2.51 [69%), FEV1 2.10 [78%), DLCO 15.16 (53%) ?Mild restriction, moderate-severe diffusion defect. ? ?04/02/2019 ?FVC 2.70 [72%], FEV1 2.18 [82%], F/F 81, DLCO 13.8 [60%] ?Moderate diffusion defect ? ?08/31/2020 ?FVC 2.52 [65%], FEV1 2.25 [82%], F/F 89, TLC 4.64 [69%], DLCO 20.59 [88%] ?Mild restriction.  Improvement in diffusion capacity ? ?08/24/2021 ?FVC 2.03 [53%], FEV1 1.53 [56%], F/F 75, TLC 2.78 [41%], DLCO 13.45 [58%] ?Severe restriction, moderate diffusion defect ? ?FENO 12/18/16- 12 ? ?Labs ?ILD serologies 01/09/17- all negative except for mild elevation in SCL 75. ? ?Assessment:  ?Progressive pulmonary fibrosis, asbestos exposure. 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  PFTs today show significant worsening of restriction and diffusion impairment. ? ?Started Esbriet in January 2023 but we had to stop it this month as he developed significant sunburn ?LFTs are normal ? ?We discussed alternate medications but he wants to take a break from therapy for now. ?Return to clinic in 4 months. ? ?Chronic  cough ?Likely secondary to pulmonary fibrosis ?He has been treated adequately for GERD ?He has some improvement with codeine cough syrup.  He reports that he felt better after albuterol inhaler which was given with the PFTs today.  We will give him a prescription for albuterol as needed ? ?Consider thalidomide for treatment of IPF related cough he continues to have persistent symptoms.  But data for those is not strong. ? ?Post COVID-19 ?Has made a good recovery.  He had mild symptoms which did not require hospitalization ? ?Health maintenance ?01/15/2015-Prevnar 13 ?05/05/2010-Pneumovax ? ?Plan/Recommendations: ?Discontinue Esbriet ?Return to clinic in 4 months ?Hycodan for cough ? ?Marshell Garfinkel MD ?[facility] ?12/14/2021, 3:00 PM ? ?CC: Binnie Rail, MD ? ? ?

## 2021-12-14 NOTE — Progress Notes (Signed)
Esbriet medication supply received. ? ?[place] and discontinued Esbriet rx. Patient removed from [facility] pt assistance enrollment ? ?Knox Saliva, PharmD, MPH, BCPS ?Clinical Pharmacist (Rheumatology and Pulmonology) ?

## 2021-12-14 NOTE — Telephone Encounter (Signed)
Prescription refill request for Xarelto received.  ?Indication:Afib ?Last office visit:1/23 ?Weight:79.6 kg ?Age:80 ?Scr:0.8 ?CrCl:85.68 ml/min ? ?Prescription refilled ? ?

## 2021-12-14 NOTE — Patient Instructions (Signed)
I am glad you are better after stopping the Esbriet ?Bili get in touch with pharmacy and discontinue the order ?We will cancel your June appointment and reschedule for August 2023. ?

## 2022-01-03 ENCOUNTER — Other Ambulatory Visit: Payer: Self-pay | Admitting: Internal Medicine

## 2022-01-19 ENCOUNTER — Other Ambulatory Visit: Payer: Self-pay | Admitting: Internal Medicine

## 2022-01-19 MED ORDER — GABAPENTIN 100 MG PO CAPS: 100.0000 mg | ORAL_CAPSULE | Freq: Every day | ORAL | 3 refills | Status: DC

## 2022-01-25 ENCOUNTER — Ambulatory Visit: Payer: HMO | Admitting: Internal Medicine

## 2022-01-25 ENCOUNTER — Encounter: Payer: Self-pay | Admitting: Internal Medicine

## 2022-01-25 ENCOUNTER — Telehealth: Payer: Self-pay

## 2022-01-25 VITALS — BP 120/60 | HR 94 | Ht 68.0 in | Wt 174.0 lb

## 2022-01-25 DIAGNOSIS — R053 Chronic cough: Secondary | ICD-10-CM

## 2022-01-25 DIAGNOSIS — R194 Change in bowel habit: Secondary | ICD-10-CM | POA: Diagnosis not present

## 2022-01-25 DIAGNOSIS — Z7901 Long term (current) use of anticoagulants: Secondary | ICD-10-CM

## 2022-01-25 DIAGNOSIS — K648 Other hemorrhoids: Secondary | ICD-10-CM | POA: Diagnosis not present

## 2022-01-25 NOTE — Telephone Encounter (Signed)
[facility] Pre-operative Risk Assessment     Request for surgical clearance:     Endoscopy Procedure  What type of surgery is being performed?     Colonoscopy  When is this surgery scheduled?     03-01-2022  What type of clearance is required ?   Pharmacy  Are there any medications that need to be held prior to surgery and how long? The day before and day of (7-11 and 7-12)  Practice name and name of physician performing surgery?      [facility]  What is your office phone and fax number?      Phone- [phone]  [phone]  Anesthesia type (None, local, MAC, general) ?       MAC

## 2022-01-25 NOTE — Patient Instructions (Signed)
If you are age 79 or older, your body mass index should be between 23-30. Your Body mass index is 26.46 kg/m. If this is out of the aforementioned range listed, please consider follow up with your Primary Care Provider.  If you are age 60 or younger, your body mass index should be between 19-25. Your Body mass index is 26.46 kg/m. If this is out of the aformentioned range listed, please consider follow up with your Primary Care Provider.   ________________________________________________________  The [facility] providers would like to encourage you to use [facility] to communicate with providers for non-urgent requests or questions.  Due to long hold times on the telephone, sending your provider a message by [facility] may be a faster and more efficient way to get a response.  Please allow 48 business hours for a response.  Please remember that this is for non-urgent requests.  _______________________________________________________  Todd [name] have been scheduled for a colonoscopy. Please follow written instructions given to you at your visit today.  Please pick up your prep supplies at the pharmacy within the next 1-3 days. If you use inhalers (even only as needed), please bring them with you on the day of your procedure.  You will be contacted by our office prior to your procedure for directions on holding your Xarelto.  If you do not hear from our office 2 week prior to your scheduled procedure, please call [phone] to discuss.  Thank you,  Dr. Silvano Rusk

## 2022-01-25 NOTE — Progress Notes (Signed)
Todd [name] 81 y.o. 1941/07/26 [id number]  Assessment & Plan:   Encounter Diagnoses  Name Primary?   Change in bowel habits Yes   Internal hemorrhoids with complication    Chronic cough    Long term current use of anticoagulant    Evaluate his signs and sxs w/ colonoscopy Consider hemorrhoid banding Keep possibility of some pancreatic insufficiency in mimd but the loos orange/yellow stools are intermittent  Hold Xarelto day before and day of procedure.Clarify w/ cardiology this is ok.  The risks and benefits as well as alternatives of endoscopic procedure(s) have been discussed and reviewed. All questions answered. The patient agrees to proceed.  [id number], Todd Lick, MD   Subjective:   Chief Complaint:change in bowel habits  HPI 81 yo wm w/ prior hx hemorrhoids and andl fissure last seen 2017 - he has intermittent orange or yellow stools when BM's are loose at times. He has some staining of underwear with feces intermittently. BM's are also more normal at times. No bleeding and no clear hx of hemorrhoids prolapsing. He thinks these BM changes have been for 1 year,  Normal colonoscopy 2014 Dr. Sharlett Iles  GI ROS o/w negative.  He has pulmonary fibrosis but says no activity-limiting dyspnea. He has had a cough x 2 years since Covid. Has seen ENT, pulmonary and was recently started on gabapentin by Dr. Quay Burow to see if that will help. Allergies  Allergen Reactions   Amlodipine Besylate     Rash Because of a history of documented adverse serious drug reaction;Medi Alert bracelet  is recommended   Ivp Dye [Iodinated Contrast Media]     Rash Because of a history of documented adverse serious drug reaction;Medi Alert bracelet  is recommended   Current Meds  Medication Sig   albuterol (VENTOLIN HFA) 108 (90 Base) MCG/ACT inhaler Inhale 2 puffs into the lungs every 6 (six) hours as needed for wheezing or shortness of breath.   cholecalciferol (VITAMIN D3) 25 MCG (1000 UNIT)  tablet Take 1,000 Units by mouth daily.   ELDERBERRY PO Take by mouth daily.   famotidine (PEPCID) 40 MG tablet Take 1 tablet (40 mg total) by mouth daily.   fluticasone (FLONASE) 50 MCG/ACT nasal spray Place 2 sprays into both nostrils daily.   gabapentin (NEURONTIN) 100 MG capsule Take 1-3 capsules (100-300 mg total) by mouth at bedtime.   glipiZIDE (GLUCOTROL) 5 MG tablet Take 1 tablet (5 mg total) by mouth 2 (two) times daily before a meal.   HYDROcodone bit-homatropine (HYCODAN) 5-1.5 MG/5ML syrup Take 5 mLs by mouth every 6 (six) hours as needed for cough.   levocetirizine (XYZAL) 5 MG tablet TAKE 1 TABLET BY MOUTH EVERY EVENING.   metFORMIN (GLUCOPHAGE-XR) 500 MG 24 hr tablet TAKE 2 TABLETS BY MOUTH TWICE A DAY   metoprolol tartrate (LOPRESSOR) 50 MG tablet Take 1 tablet (50 mg total) by mouth 2 (two) times daily.   nitroGLYCERIN (NITROSTAT) 0.4 MG SL tablet Place 1 tablet (0.4 mg total) under the tongue every 5 (five) minutes as needed for chest pain.   Omega-3 Fatty Acids (FISH OIL PO) Take 1 tablet by mouth daily.   omeprazole (PRILOSEC) 40 MG capsule Take 1 capsule (40 mg total) by mouth daily.   ONETOUCH DELICA LANCETS 99991111 MISC TEST BLOOD SUGAR ONCE DAILY   ONETOUCH VERIO test strip USE UP TO 4 TIMES A DAY AS DIRECTED   rivaroxaban (XARELTO) 20 MG TABS tablet TAKE 1 TABLET BY MOUTH DAILY WITH SUPPER  simvastatin (ZOCOR) 40 MG tablet Take 1 tablet (40 mg total) by mouth every evening.   triamcinolone cream (KENALOG) 0.1 %    Past Medical History:  Diagnosis Date   Allergic rhinitis    Asthma    Atrial fibrillation with rapid ventricular response ([place])    a. newly diagnosed 11/03/13, spont conv to NSR, placed on xarelto.   CAD (coronary artery disease)    a. 1999; s/p CABG: LIMA to LAD, SVG to OM1, SVG to OM2, SVG to RCA  b. Normal nuc 10/2013 (done because of new onset AF.)   COVID    Diabetes mellitus ([name])    HLD (hyperlipidemia)    HTN (hypertension)    Nephrolithiasis     Overweight(278.02)    PVD (peripheral vascular disease) ([place])    Carotid stenosis   Stroke [facility])    Past Surgical History:  Procedure Laterality Date   CAROTID ENDARTERECTOMY  1999   COLONOSCOPY  2014   negative;Dr Sharlett Iles   COLONOSCOPY W/ POLYPECTOMY  2009   Dr Sharlett Iles   CORONARY ARTERY BYPASS GRAFT  Aug.1999   HEMORRHOID BANDING     INTRACAPSULAR CATARACT EXTRACTION Bilateral 2018   NASAL SINUS SURGERY     Social History   Social History Narrative   Not on file   family history includes Asthma in his sister; Colon cancer (age of onset: 93) in his brother; Diabetes in his father; Heart attack (age of onset: 70) in his brother; Heart attack (age of onset: 44) in his brother and mother; Hemochromatosis in his brother; Prostate cancer in his brother; Stroke (age of onset: 83) in his father.   Review of Systems Allergies/sinus sxs, decreased hearing, joint pains, fatigue, insomnia, excess urination and chronic cough Otherwise neg or as per HPI  Objective:   Physical Exam BP 120/60   Pulse 94   Ht '5\' 8"'$  (1.727 m)   Wt 174 lb (78.9 kg)   BMI 26.46 kg/m  '@BP'$  120/60   Pulse 94   Ht '5\' 8"'$  (1.727 m)   Wt 174 lb (78.9 kg)   BMI 26.46 kg/m @  General:  Well-developed, well-nourished and in no acute distress Eyes:  anicteric. ENT:   Mouth and posterior pharynx free of lesions.  Neck:   supple w/o thyromegaly or mass.  Lungs: Dry crackles R>L. Heart:  S1S2, no rubs, murmurs, gallops. Abdomen:  soft, non-tender, no hepatosplenomegaly,  or mass and BS+. Small reducible umbilical hernia  Rectal:no mass, non-tender, enlarged smooth prostate. Sl decreased resting tone with good voluntary tone  Anoscopy Gr 2 internal hemorrhoids all positions  Lymph:  no cervical or supraclavicular adenopathy. Extremities:   no edema, cyanosis or clubbing Skin   no rash. Neuro:  A&O x 3.  Psych:  appropriate mood and  Affect.   Data Reviewed: See HPI

## 2022-01-28 NOTE — Telephone Encounter (Signed)
Patient with diagnosis of atrial fibrillation on Xarelto for anticoagulation.    Procedure: colonoscopy Date of procedure: 03/01/22   CHA2DS2-VASc Score = 7   This indicates a 11.2% annual risk of stroke. The patient's score is based upon: CHF History: 0 HTN History: 1 Diabetes History: 1 Stroke History: 2 Vascular Disease History: 1 Age Score: 2 Gender Score: 0   Per chart indication of TIA/CVA post carotid endarterectomy 1999  CrCl 75 Platelet count 175  Per office protocol, patient can hold Xarelto for 1 days prior to procedure as well as day of procedure.      Patient will not need bridging with Lovenox (enoxaparin) around procedure.

## 2022-01-30 ENCOUNTER — Telehealth: Payer: Self-pay

## 2022-01-30 NOTE — Telephone Encounter (Signed)
Patient's wife informed to have him hold his xarelto 02/28/22 and 03/01/22. [place] was unavailable. She verbalized understanding.

## 2022-01-30 NOTE — Telephone Encounter (Signed)
   Primary Cardiologist: Minus Breeding, MD  Chart reviewed as part of pre-operative protocol coverage. Given past medical history and time since last visit, based on ACC/AHA guidelines, Todd [name] would be at acceptable risk for the planned procedure without further cardiovascular testing.   Guidelines for holding Xarelto are as follows:  CHA2DS2-VASc Score = 7   This indicates a 11.2% annual risk of stroke. The patient's score is based upon: CHF History: 0 HTN History: 1 Diabetes History: 1 Stroke History: 2 Vascular Disease History: 1 Age Score: 2 Gender Score: 0   Per chart indication of TIA/CVA post carotid endarterectomy 1999   CrCl 75 Platelet count 175   Per office protocol, patient can hold Xarelto for 1 days prior to procedure as well as day of procedure.      Patient will not need bridging with Lovenox (enoxaparin) around procedure.    I will route this recommendation to the requesting party via Epic fax function and remove from pre-op pool.  Please call with questions.   Todd Life, NP-C    01/30/2022, 10:01 AM [place] [address] Office [phone] Fax [phone]

## 2022-02-05 NOTE — Progress Notes (Unsigned)
Subjective:    Patient ID: Todd [name], male    DOB: 04-06-1941, 81 y.o.   MRN: [id number]     HPI Todd [name] is here for follow up of his chronic medical problems, including CAD, Afib, htn, DM, hld, GERD, pulm fibrosis, chronic cough, B12 def   Since he was here last we started him on gabapentin to see if that would help at all with his cough.  He has noticed 25% improvement in the cough since taking 300 mg of gabapentin at night.  He has not feels like he has mucus in his throat that he cannot bring up that is one of the reasons he coughs.  He continues to have decreased taste, decreased smell and decreased appetite.  Medications and allergies reviewed with patient and updated if appropriate.  Current Outpatient Medications on File Prior to Visit  Medication Sig Dispense Refill   albuterol (VENTOLIN HFA) 108 (90 Base) MCG/ACT inhaler Inhale 2 puffs into the lungs every 6 (six) hours as needed for wheezing or shortness of breath. 8 g 6   cholecalciferol (VITAMIN D3) 25 MCG (1000 UNIT) tablet Take 1,000 Units by mouth daily.     ELDERBERRY PO Take by mouth daily.     famotidine (PEPCID) 40 MG tablet Take 1 tablet (40 mg total) by mouth daily. 90 tablet 1   fluticasone (FLONASE) 50 MCG/ACT nasal spray Place 2 sprays into both nostrils daily. 16 g 6   glipiZIDE (GLUCOTROL) 5 MG tablet Take 1 tablet (5 mg total) by mouth 2 (two) times daily before a meal. 180 tablet 1   HYDROcodone bit-homatropine (HYCODAN) 5-1.5 MG/5ML syrup Take 5 mLs by mouth every 6 (six) hours as needed for cough. 240 mL 0   levocetirizine (XYZAL) 5 MG tablet TAKE 1 TABLET BY MOUTH EVERY EVENING. 30 tablet 5   metFORMIN (GLUCOPHAGE-XR) 500 MG 24 hr tablet TAKE 2 TABLETS BY MOUTH TWICE A DAY 120 tablet 1   metoprolol tartrate (LOPRESSOR) 50 MG tablet Take 1 tablet (50 mg total) by mouth 2 (two) times daily. 60 tablet 11   nitroGLYCERIN (NITROSTAT) 0.4 MG SL tablet Place 1 tablet (0.4 mg total) under the tongue every  5 (five) minutes as needed for chest pain. 100 tablet 3   Omega-3 Fatty Acids (FISH OIL PO) Take 1 tablet by mouth daily.     omeprazole (PRILOSEC) 40 MG capsule Take 1 capsule (40 mg total) by mouth daily. 30 capsule 5   ONETOUCH DELICA LANCETS 30Q MISC TEST BLOOD SUGAR ONCE DAILY 100 each 3   ONETOUCH VERIO test strip USE UP TO 4 TIMES A DAY AS DIRECTED 100 strip 0   rivaroxaban (XARELTO) 20 MG TABS tablet TAKE 1 TABLET BY MOUTH DAILY WITH SUPPER 30 tablet 5   simvastatin (ZOCOR) 40 MG tablet Take 1 tablet (40 mg total) by mouth every evening. 90 tablet 2   triamcinolone cream (KENALOG) 0.1 %   3   No current facility-administered medications on file prior to visit.     Review of Systems  Constitutional:  Negative for fever.  Respiratory:  Positive for cough. Negative for shortness of breath and wheezing.   Cardiovascular:  Negative for chest pain, palpitations and leg swelling.  Neurological:  Negative for light-headedness and headaches.       Objective:   Vitals:   02/07/22 0833  BP: 132/74  Pulse: 89  Temp: 98.3 F (36.8 C)  SpO2: 94%   BP Readings from Last  3 Encounters:  02/07/22 132/74  01/25/22 120/60  12/14/21 138/80   Wt Readings from Last 3 Encounters:  02/07/22 174 lb (78.9 kg)  01/25/22 174 lb (78.9 kg)  12/14/21 175 lb 6.4 oz (79.6 kg)   Body mass index is 26.46 kg/m.    Physical Exam Constitutional:      General: He is not in acute distress.    Appearance: Normal appearance. He is not ill-appearing.  HENT:     Head: Normocephalic and atraumatic.  Eyes:     Conjunctiva/sclera: Conjunctivae normal.  Cardiovascular:     Rate and Rhythm: Normal rate and regular rhythm.     Heart sounds: Normal heart sounds. No murmur heard. Pulmonary:     Effort: Pulmonary effort is normal. No respiratory distress.     Breath sounds: Normal breath sounds. No wheezing or rales.  Musculoskeletal:     Right lower leg: No edema.     Left lower leg: No edema.   Skin:    General: Skin is warm and dry.     Findings: No rash.  Neurological:     Mental Status: He is alert. Mental status is at baseline.  Psychiatric:        Mood and Affect: Mood normal.        Lab Results  Component Value Date   WBC 5.6 08/03/2021   HGB 12.6 (L) 08/03/2021   HCT 37.3 (L) 08/03/2021   PLT 175.0 08/03/2021   GLUCOSE 161 (H) 12/06/2021   CHOL 132 11/08/2020   TRIG 150.0 (H) 11/08/2020   HDL 37.10 (L) 11/08/2020   LDLCALC 65 11/08/2020   ALT 13 12/06/2021   ALT 13 12/06/2021   AST 20 12/06/2021   AST 20 12/06/2021   NA 138 12/06/2021   K 4.3 12/06/2021   CL 104 12/06/2021   CREATININE 0.82 12/06/2021   BUN 26 (H) 12/06/2021   CO2 28 12/06/2021   TSH 4.45 05/23/2021   PSA 0.24 07/20/2008   INR 1.55 (H) 11/11/2014   HGBA1C 7.5 (H) 08/03/2021   MICROALBUR 1.9 05/07/2020     Assessment & Plan:    See Problem List for Assessment and Plan of chronic medical problems.

## 2022-02-05 NOTE — Patient Instructions (Addendum)
     Blood work was ordered.     Medications changes include :   start gabapentin 100 mg in morning, continue 300 mg at night   Your prescription(s) have been sent to your pharmacy.     Return in about 6 months (around 08/09/2022) for follow up.

## 2022-02-07 ENCOUNTER — Encounter: Payer: Self-pay | Admitting: Internal Medicine

## 2022-02-07 ENCOUNTER — Ambulatory Visit: Payer: HMO | Admitting: Pulmonary Disease

## 2022-02-07 ENCOUNTER — Ambulatory Visit (INDEPENDENT_AMBULATORY_CARE_PROVIDER_SITE_OTHER): Payer: HMO | Admitting: Internal Medicine

## 2022-02-07 VITALS — BP 132/74 | HR 89 | Temp 98.3°F | Ht 68.0 in | Wt 174.0 lb

## 2022-02-07 DIAGNOSIS — R053 Chronic cough: Secondary | ICD-10-CM

## 2022-02-07 DIAGNOSIS — K219 Gastro-esophageal reflux disease without esophagitis: Secondary | ICD-10-CM

## 2022-02-07 DIAGNOSIS — E538 Deficiency of other specified B group vitamins: Secondary | ICD-10-CM

## 2022-02-07 DIAGNOSIS — I10 Essential (primary) hypertension: Secondary | ICD-10-CM

## 2022-02-07 DIAGNOSIS — I2510 Atherosclerotic heart disease of native coronary artery without angina pectoris: Secondary | ICD-10-CM | POA: Diagnosis not present

## 2022-02-07 DIAGNOSIS — I4891 Unspecified atrial fibrillation: Secondary | ICD-10-CM

## 2022-02-07 DIAGNOSIS — E7849 Other hyperlipidemia: Secondary | ICD-10-CM

## 2022-02-07 DIAGNOSIS — E1151 Type 2 diabetes mellitus with diabetic peripheral angiopathy without gangrene: Secondary | ICD-10-CM

## 2022-02-07 LAB — CBC WITH DIFFERENTIAL/PLATELET
Basophils Absolute: 0 10*3/uL (ref 0.0–0.1)
Basophils Relative: 0.2 % (ref 0.0–3.0)
Eosinophils Absolute: 0.1 10*3/uL (ref 0.0–0.7)
Eosinophils Relative: 1.3 % (ref 0.0–5.0)
HCT: 35 % — ABNORMAL LOW (ref 39.0–52.0)
Hemoglobin: 11.9 g/dL — ABNORMAL LOW (ref 13.0–17.0)
Lymphocytes Relative: 30.7 % (ref 12.0–46.0)
Lymphs Abs: 1.7 10*3/uL (ref 0.7–4.0)
MCHC: 34 g/dL (ref 30.0–36.0)
MCV: 102.4 fl — ABNORMAL HIGH (ref 78.0–100.0)
Monocytes Absolute: 0.6 10*3/uL (ref 0.1–1.0)
Monocytes Relative: 10.5 % (ref 3.0–12.0)
Neutro Abs: 3.2 10*3/uL (ref 1.4–7.7)
Neutrophils Relative %: 57.3 % (ref 43.0–77.0)
Platelets: 180 10*3/uL (ref 150.0–400.0)
RBC: 3.42 Mil/uL — ABNORMAL LOW (ref 4.22–5.81)
RDW: 15 % (ref 11.5–15.5)
WBC: 5.6 10*3/uL (ref 4.0–10.5)

## 2022-02-07 LAB — COMPREHENSIVE METABOLIC PANEL
ALT: 11 U/L (ref 0–53)
AST: 19 U/L (ref 0–37)
Albumin: 3.8 g/dL (ref 3.5–5.2)
Alkaline Phosphatase: 69 U/L (ref 39–117)
BUN: 19 mg/dL (ref 6–23)
CO2: 28 mEq/L (ref 19–32)
Calcium: 9.6 mg/dL (ref 8.4–10.5)
Chloride: 102 mEq/L (ref 96–112)
Creatinine, Ser: 0.96 mg/dL (ref 0.40–1.50)
GFR: 75.43 mL/min (ref >60.00)
Glucose, Bld: 133 mg/dL — ABNORMAL HIGH (ref 70–99)
Potassium: 4.7 mEq/L (ref 3.5–5.1)
Sodium: 138 mEq/L (ref 135–145)
Total Bilirubin: 0.7 mg/dL (ref 0.2–1.2)
Total Protein: 7.7 g/dL (ref 6.0–8.3)

## 2022-02-07 LAB — LIPID PANEL
Cholesterol: 113 mg/dL (ref 0–200)
HDL: 35.5 mg/dL — ABNORMAL LOW (ref >39.00)
LDL Cholesterol: 62 mg/dL (ref 0–99)
NonHDL: 77.49
Total CHOL/HDL Ratio: 3
Triglycerides: 77 mg/dL (ref 0.0–149.0)
VLDL: 15.4 mg/dL (ref 0.0–40.0)

## 2022-02-07 LAB — HEMOGLOBIN A1C: Hgb A1c MFr Bld: 7.6 % — ABNORMAL HIGH (ref 4.6–6.5)

## 2022-02-07 LAB — VITAMIN B12: Vitamin B-12: 541 pg/mL (ref 211–911)

## 2022-02-07 LAB — MICROALBUMIN / CREATININE URINE RATIO
Creatinine,U: 61.2 mg/dL
Microalb Creat Ratio: 1.1 mg/g (ref 0.0–30.0)
Microalb, Ur: <0.7 mg/dL (ref 0.0–1.9)

## 2022-02-07 MED ORDER — GABAPENTIN 100 MG PO CAPS: ORAL_CAPSULE | ORAL | 5 refills | Status: DC

## 2022-02-07 NOTE — Assessment & Plan Note (Signed)
Chronic Did do a series of B12 injections Now taking oral B12 daily Check B12 level

## 2022-02-07 NOTE — Assessment & Plan Note (Signed)
Chronic Regular exercise and healthy diet encouraged Check lipid panel  Continue simvastatin 40 mg daily 

## 2022-02-07 NOTE — Assessment & Plan Note (Addendum)
Chronic Thought to be multifactorial Following with pulmonary for his pulmonary fibrosis and they did not feel his cough was a primary pulmonary issue Omeprazole was increased to 40 mg twice daily and this did not help-decreased back to once daily and taking Pepcid 40 mg once daily Saw ENT-they did not identify a cause-exam was normal Antihistamines have not helped Since he was here last we started him on gabapentin at bedtime to see if that would help-this improve the cough by 25% Continue gabapentin 300 mg at night, start gabapentin 100 mg in the morning Advised him to increase fluids-he does not drink much water and see if that helps with the cough Can titrate gabapentin to see if that helps

## 2022-02-07 NOTE — Assessment & Plan Note (Signed)
Chronic GERD controlled Continue omeprazole 40 mg daily, famotidine 40 mg daily 

## 2022-02-07 NOTE — Assessment & Plan Note (Signed)
Chronic Asymptomatic Following with cardiology On Xarelto and metoprolol CBC, CMP

## 2022-02-07 NOTE — Assessment & Plan Note (Signed)
Chronic Blood pressure well controlled CMP Continue metoprolol 50 mg twice daily 

## 2022-02-07 NOTE — Assessment & Plan Note (Signed)
Chronic  Lab Results  Component Value Date   HGBA1C 7.5 (H) 08/03/2021   Sugars not ideally controlled Testing sugars 1times a day Check A1c, urine microalbumin today Continue metformin XR 1000 mg twice daily, glipizide 5 mg twice daily-we will adjust medication if needed Stressed regular exercise, diabetic diet

## 2022-02-07 NOTE — Assessment & Plan Note (Signed)
Chronic Following with cardiology On Xarelto, metoprolol, simvastatin

## 2022-02-09 ENCOUNTER — Other Ambulatory Visit: Payer: Self-pay | Admitting: Internal Medicine

## 2022-02-09 ENCOUNTER — Encounter: Payer: Self-pay | Admitting: Internal Medicine

## 2022-02-09 DIAGNOSIS — D649 Anemia, unspecified: Secondary | ICD-10-CM

## 2022-02-09 MED ORDER — GLIPIZIDE 5 MG PO TABS: ORAL_TABLET | ORAL | 1 refills | Status: DC

## 2022-02-13 ENCOUNTER — Ambulatory Visit (INDEPENDENT_AMBULATORY_CARE_PROVIDER_SITE_OTHER): Payer: HMO | Admitting: *Deleted

## 2022-02-13 ENCOUNTER — Other Ambulatory Visit (INDEPENDENT_AMBULATORY_CARE_PROVIDER_SITE_OTHER): Payer: HMO

## 2022-02-13 DIAGNOSIS — D649 Anemia, unspecified: Secondary | ICD-10-CM | POA: Diagnosis not present

## 2022-02-13 DIAGNOSIS — Z7189 Other specified counseling: Secondary | ICD-10-CM | POA: Diagnosis not present

## 2022-02-13 DIAGNOSIS — J8410 Pulmonary fibrosis, unspecified: Secondary | ICD-10-CM

## 2022-02-13 DIAGNOSIS — E1151 Type 2 diabetes mellitus with diabetic peripheral angiopathy without gangrene: Secondary | ICD-10-CM

## 2022-02-13 DIAGNOSIS — Z5181 Encounter for therapeutic drug level monitoring: Secondary | ICD-10-CM

## 2022-02-13 LAB — CBC WITH DIFFERENTIAL/PLATELET
Basophils Absolute: 0 10*3/uL (ref 0.0–0.1)
Basophils Relative: 0.2 % (ref 0.0–3.0)
Eosinophils Absolute: 0.1 10*3/uL (ref 0.0–0.7)
Eosinophils Relative: 1.7 % (ref 0.0–5.0)
HCT: 36 % — ABNORMAL LOW (ref 39.0–52.0)
Hemoglobin: 12.4 g/dL — ABNORMAL LOW (ref 13.0–17.0)
Lymphocytes Relative: 33.1 % (ref 12.0–46.0)
Lymphs Abs: 1.8 10*3/uL (ref 0.7–4.0)
MCHC: 34.4 g/dL (ref 30.0–36.0)
MCV: 102 fl — ABNORMAL HIGH (ref 78.0–100.0)
Monocytes Absolute: 0.5 10*3/uL (ref 0.1–1.0)
Monocytes Relative: 9.8 % (ref 3.0–12.0)
Neutro Abs: 3.1 10*3/uL (ref 1.4–7.7)
Neutrophils Relative %: 55.2 % (ref 43.0–77.0)
Platelets: 177 10*3/uL (ref 150.0–400.0)
RBC: 3.52 Mil/uL — ABNORMAL LOW (ref 4.22–5.81)
RDW: 15.6 % — ABNORMAL HIGH (ref 11.5–15.5)
WBC: 5.5 10*3/uL (ref 4.0–10.5)

## 2022-02-13 LAB — COMPREHENSIVE METABOLIC PANEL
ALT: 10 U/L (ref 0–53)
AST: 18 U/L (ref 0–37)
Albumin: 3.8 g/dL (ref 3.5–5.2)
Alkaline Phosphatase: 63 U/L (ref 39–117)
BUN: 18 mg/dL (ref 6–23)
CO2: 26 mEq/L (ref 19–32)
Calcium: 9.5 mg/dL (ref 8.4–10.5)
Chloride: 106 mEq/L (ref 96–112)
Creatinine, Ser: 0.92 mg/dL (ref 0.40–1.50)
GFR: 79.38 mL/min (ref >60.00)
Glucose, Bld: 116 mg/dL — ABNORMAL HIGH (ref 70–99)
Potassium: 4.1 mEq/L (ref 3.5–5.1)
Sodium: 142 mEq/L (ref 135–145)
Total Bilirubin: 0.6 mg/dL (ref 0.2–1.2)
Total Protein: 7.3 g/dL (ref 6.0–8.3)

## 2022-02-13 LAB — HEPATIC FUNCTION PANEL
ALT: 10 U/L (ref 0–53)
AST: 18 U/L (ref 0–37)
Albumin: 3.8 g/dL (ref 3.5–5.2)
Alkaline Phosphatase: 63 U/L (ref 39–117)
Bilirubin, Direct: 0.1 mg/dL (ref 0.0–0.3)
Total Bilirubin: 0.6 mg/dL (ref 0.2–1.2)
Total Protein: 7.3 g/dL (ref 6.0–8.3)

## 2022-02-13 LAB — FOLATE: Folate: 15.3 ng/mL (ref >5.9)

## 2022-02-13 LAB — IBC PANEL
Iron: 92 ug/dL (ref 42–165)
Saturation Ratios: 27.4 % (ref 20.0–50.0)
TIBC: 336 ug/dL (ref 250.0–450.0)
Transferrin: 240 mg/dL (ref 212.0–360.0)

## 2022-02-13 LAB — FERRITIN: Ferritin: 39.1 ng/mL (ref 22.0–322.0)

## 2022-02-13 NOTE — Chronic Care Management (AMB) (Signed)
Chronic Care Management   CCM RN Visit Note  02/13/2022 Name: Todd [name] MRN: [id number] DOB: [birth year]  Subjective: Todd [name] is a 81 y.o. year old male who is a primary care patient of Burns, [name], MD. The care management team was consulted for assistance with disease management and care coordination needs.    Engaged with patient's spouse/ caregiver [name] on [facility] DPR by telephone for follow up visit/ case closure in response to provider referral for case management and/or care coordination services.   Consent to Services:  The patient was given information about Chronic Care Management services, agreed to services, and gave verbal consent prior to initiation of services.  Please see initial visit note for detailed documentation.  Patient agreed to services and verbal consent obtained.   Assessment: Review of patient past medical history, allergies, medications, health status, including review of consultants reports, laboratory and other test data, was performed as part of comprehensive evaluation and provision of chronic care management services.   SDOH (Social Determinants of Health) assessments and interventions performed:  SDOH Interventions    Flowsheet Row Most Recent Value  SDOH Interventions   Food Insecurity Interventions Intervention Not Indicated  [continues to deny food insecurity]  Transportation Interventions Intervention Not Indicated  [patient continues to drive self]     CCM Care Plan  Allergies  Allergen Reactions   Amlodipine Besylate     Rash Because of a history of documented adverse serious drug reaction;Medi Alert bracelet  is recommended   Ivp Dye [Iodinated Contrast Media]     Rash Because of a history of documented adverse serious drug reaction;Medi Alert bracelet  is recommended   Outpatient Encounter Medications as of 02/13/2022  Medication Sig   albuterol (VENTOLIN HFA) 108 (90 Base) MCG/ACT inhaler Inhale 2 puffs into the lungs every 6  (six) hours as needed for wheezing or shortness of breath.   cholecalciferol (VITAMIN D3) 25 MCG (1000 UNIT) tablet Take 1,000 Units by mouth daily.   ELDERBERRY PO Take by mouth daily.   famotidine (PEPCID) 40 MG tablet Take 1 tablet (40 mg total) by mouth daily.   fluticasone (FLONASE) 50 MCG/ACT nasal spray Place 2 sprays into both nostrils daily.   gabapentin (NEURONTIN) 100 MG capsule Take 1 tab in morning, take 3 tabs at HS   glipiZIDE (GLUCOTROL) 5 MG tablet Take 2 pills with breakfast and 1 pill with dinner   HYDROcodone bit-homatropine (HYCODAN) 5-1.5 MG/5ML syrup Take 5 mLs by mouth every 6 (six) hours as needed for cough.   levocetirizine (XYZAL) 5 MG tablet TAKE 1 TABLET BY MOUTH EVERY EVENING.   metFORMIN (GLUCOPHAGE-XR) 500 MG 24 hr tablet TAKE 2 TABLETS BY MOUTH TWICE A DAY   metoprolol tartrate (LOPRESSOR) 50 MG tablet Take 1 tablet (50 mg total) by mouth 2 (two) times daily.   nitroGLYCERIN (NITROSTAT) 0.4 MG SL tablet Place 1 tablet (0.4 mg total) under the tongue every 5 (five) minutes as needed for chest pain.   Omega-3 Fatty Acids (FISH OIL PO) Take 1 tablet by mouth daily.   omeprazole (PRILOSEC) 40 MG capsule Take 1 capsule (40 mg total) by mouth daily.   ONETOUCH DELICA LANCETS 99991111 MISC TEST BLOOD SUGAR ONCE DAILY   ONETOUCH VERIO test strip USE UP TO 4 TIMES A DAY AS DIRECTED   rivaroxaban (XARELTO) 20 MG TABS tablet TAKE 1 TABLET BY MOUTH DAILY WITH SUPPER   simvastatin (ZOCOR) 40 MG tablet Take 1 tablet (40 mg total) by  mouth every evening.   triamcinolone cream (KENALOG) 0.1 %    No facility-administered encounter medications on file as of 02/13/2022.   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   Sinus  drainage 07/28/2020   Aortic atherosclerosis ([place]) 05/07/2020   GERD (gastroesophageal reflux disease) 05/06/2019   Pulmonary fibrosis ([place]) 05/05/2019   Asbestosis ([place]) 05/05/2019   Ventral hernia without obstruction or gangrene [id number]   Umbilical hernia without obstruction or gangrene 10/31/2018   Eczema 10/29/2017   Hives 08/13/2017   Chronic Cough 12/04/2016   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   Hemorrhoids, internal, with bleeding 01/15/2015   Bursitis of left shoulder 01/14/2015   Type 2 diabetes, controlled, with peripheral circulatory disorder ([place]) 12/05/2013   PVD (peripheral vascular disease) ([place]) 11/12/2013   Atrial fibrillation ([place]) 11/03/2013   Carotid artery disease ([place]) 12/20/2009   OVERWEIGHT/OBESITY 11/17/2008   Hyperlipidemia 07/20/2008   Essential hypertension 07/20/2008   Coronary atherosclerosis 07/20/2008   ALLERGIC RHINITIS 07/20/2008   NEPHROLITHIASIS, HX OF 07/20/2008   Conditions to be addressed/monitored:  DMII; COPD/ ILD- pulmonary fibrosis  Care Plan : RN Care Manager Plan of Care  Updates made by Knox Royalty, RN since 02/13/2022 12:00 AM     Problem: Chronic Disease Management Needs   Priority: High     Long-Range Goal: Development of Plan of Care for long term chronic disease management   Start Date: 07/28/2021  Expected End Date: 07/28/2022  Priority: High  Note:   Current Barriers:  Chronic Disease Management support and education needs related to COPD/ pulmonary fibrosis and DMII Ongoing chronic respiratory issues post- COVID infection- followed by pulmonary provider for pulmonary fibrosis/ COPD/ chronic severe cough post- COVID  RNCM Clinical Goal(s):  Patient will demonstrate ongoing health management independence as evidenced by adherence to plan of care for DMII/ pulmonary fibrosis        through collaboration with RN Care manager, provider, and care team.   Interventions: 1:1 collaboration with primary care provider  regarding development and update of comprehensive plan of care as evidenced by provider attestation and co-signature Inter-disciplinary care team collaboration (see longitudinal plan of care) Evaluation of current treatment plan related to  self management and patient's adherence to plan as established by provider Review of patient status, including review of consultants reports, relevant laboratory and other test results, and medications completed SDOH updated: no new/ unmet concerns identified Pain assessment updated: caregiver/ spouse denies that patient is in pain Falls assessment updated: continues to deny new/ recent falls x 12 months- currently not using assistive devices;  positive reinforcement provided with encouragement to continue efforts at fall prevention; previously provided education around fall risks/ prevention reinforced Medications discussed: reports continues to independently self-manage medications with minimal assistance/ support as indicated from spouse; denies current concerns/ issues/ questions around medications; endorses adherence to taking all medications as prescribed Reviewed upcoming scheduled provider appointments: 03/01/22- colonoscopy; 04/10/22- pulmonary provider; 08/10/22- PCP; spouse confirms is aware of all and has plans to attend as scheduled Discussed plans with patient for ongoing care management follow up- spouse reports patient is at baseline; she denies ongoing care coordination/ care management needs; CCM RN CM case closure  COPD/ Pulmonary Fibrosis: (Status: 02/13/22: Goal Met.) Long Term Goal  Advised patient to self assesses COPD action plan zone and make appointment with provider if in the yellow zone for 48 hours without improvement Advised patient to engage in light exercise as tolerated 3-5 days a week to aid in the the management of COPD Discussed the importance of adequate rest and management of fatigue with COPD Reviewed recent pulmonary office  visit 12/14/21-- continues to report ongoing severe cough, confirmed has stopped taking Esbriet and continues to manage cough using OTC meds/ other prescribed medications, however, spouse reports, "nothing has helped his cough" Confirmed continues occasionally using rescue inhaler, "about 2-4 times per week" Reinforced previously provided education around general action plan for COPD/ ILD flares  Diabetes:  (Status: 02/13/22: Goal Met.) Long Term Goal   Lab Results  Component Value Date   HGBA1C 7.6 (H) 02/07/2022  Reviewed prescribed diet with patient low carbohydrate, low sugar, low salt; Counseled on importance of regular laboratory monitoring as prescribed;        Confirmed patient continues to monitor blood sugars at home 3 times per week fasting; spouse again reports general ranges consistently between 120-140; states blood sugars "the same;" provides several recent values today, all between 120-150 Confirmed no low blood sugars; confirmed no signs/ symptoms hypoglycemia-- however, spouse tells me today that while driving with both of them in the car, patient had transient episode which he described as "my vision went black;" she states this lasted "only one second;" and denies that patient had other symptoms- states "he said he felt fine;" I advised her to take to ED/ contact PCP should this recur- she verbalizes understanding and agreement;  she tells me that patient is out doing yard work and weed-eating as we talk, states "he is feeling okay if he is doing that" Reinforced previously provided education around signs/ symptoms hypoglycemia along with corresponding action plan; patient and spouse/ caregiver will benefit form ongoing reinforcement/ suppor/ and education Confirmed patient has made recommended changes to diabetes medications, now taking glipizide (2) pills in morning and (1) pill at night; endorses adherence to all prescribed medications Reinforced previously provided education  around diabetic diet: patient continues trying to follow; wife continues to report ongoing decreased appetite, states "does the best" he can to follow dietary recommendations around decreased appetite    Plan: No further follow up required: spouse/ caregiver denies ongoing care coordination/ care management needs; CCM RN CM goals met; case closure accordingly  Oneta Rack, RN, BSN, [place] [phone]: direct office

## 2022-02-17 DIAGNOSIS — J449 Chronic obstructive pulmonary disease, unspecified: Secondary | ICD-10-CM

## 2022-02-17 DIAGNOSIS — Z7984 Long term (current) use of oral hypoglycemic drugs: Secondary | ICD-10-CM

## 2022-02-17 DIAGNOSIS — E1159 Type 2 diabetes mellitus with other circulatory complications: Secondary | ICD-10-CM

## 2022-02-22 ENCOUNTER — Encounter: Payer: Self-pay | Admitting: Internal Medicine

## 2022-03-01 ENCOUNTER — Ambulatory Visit (AMBULATORY_SURGERY_CENTER): Payer: HMO | Admitting: Internal Medicine

## 2022-03-01 ENCOUNTER — Encounter: Payer: Self-pay | Admitting: Internal Medicine

## 2022-03-01 VITALS — BP 136/64 | HR 75 | Temp 97.3°F | Resp 17 | Ht 68.0 in | Wt 174.0 lb

## 2022-03-01 DIAGNOSIS — K648 Other hemorrhoids: Secondary | ICD-10-CM | POA: Diagnosis not present

## 2022-03-01 DIAGNOSIS — D122 Benign neoplasm of ascending colon: Secondary | ICD-10-CM | POA: Diagnosis not present

## 2022-03-01 DIAGNOSIS — J45909 Unspecified asthma, uncomplicated: Secondary | ICD-10-CM | POA: Diagnosis not present

## 2022-03-01 DIAGNOSIS — I4891 Unspecified atrial fibrillation: Secondary | ICD-10-CM | POA: Diagnosis not present

## 2022-03-01 DIAGNOSIS — D123 Benign neoplasm of transverse colon: Secondary | ICD-10-CM | POA: Diagnosis not present

## 2022-03-01 DIAGNOSIS — I10 Essential (primary) hypertension: Secondary | ICD-10-CM | POA: Diagnosis not present

## 2022-03-01 DIAGNOSIS — R194 Change in bowel habit: Secondary | ICD-10-CM

## 2022-03-01 DIAGNOSIS — I2510 Atherosclerotic heart disease of native coronary artery without angina pectoris: Secondary | ICD-10-CM | POA: Diagnosis not present

## 2022-03-01 MED ORDER — SODIUM CHLORIDE 0.9 % IV SOLN: 500.0000 mL | Freq: Once | INTRAVENOUS | Status: DC

## 2022-03-01 NOTE — Progress Notes (Unsigned)
[facility] History and Physical   Primary Care Physician:  Binnie Rail, MD   Reason for Procedure:   Change in bowel habits  Plan:    Colonoscopy - Xarelto was held     HPI: Todd [name] is a 81 y.o. male w/ prior hx hemorrhoids and andl fissure last seen 2017 - he has intermittent orange or yellow stools when BM's are loose at times. He has some staining of underwear with feces intermittently. BM's are also more normal at times. No bleeding and no clear hx of hemorrhoids prolapsing. He thinks these BM changes have been for 1 year,   Normal colonoscopy 2014 Dr. Sharlett Iles   GI ROS o/w negative.   He has pulmonary fibrosis but says no activity-limiting dyspnea. He has had a cough x 2 years since Covid. Has seen ENT, pulmonary and was recently started on gabapentin by Dr. Quay Burow to see if that will help.    Past Medical History:  Diagnosis Date   Allergic rhinitis    Asthma    Atrial fibrillation with rapid ventricular response ([place])    a. newly diagnosed 11/03/13, spont conv to NSR, placed on xarelto.   CAD (coronary artery disease)    a. 1999; s/p CABG: LIMA to LAD, SVG to OM1, SVG to OM2, SVG to RCA  b. Normal nuc 10/2013 (done because of new onset AF.)   COVID    Diabetes mellitus ([place])    HLD (hyperlipidemia)    HTN (hypertension)    Nephrolithiasis    Overweight(278.02)    PVD (peripheral vascular disease) ([place])    Carotid stenosis   Stroke [facility])     Past Surgical History:  Procedure Laterality Date   CAROTID ENDARTERECTOMY  1999   COLONOSCOPY  2014   negative;Dr Sharlett Iles   COLONOSCOPY W/ POLYPECTOMY  2009   Dr Sharlett Iles   CORONARY ARTERY BYPASS GRAFT  Aug.1999   HEMORRHOID BANDING     INTRACAPSULAR CATARACT EXTRACTION Bilateral 2018   NASAL SINUS SURGERY      Prior to Admission medications   Medication Sig Start Date End Date Taking? Authorizing Provider  cholecalciferol (VITAMIN D3) 25 MCG (1000 UNIT) tablet Take 1,000 Units by mouth  daily.   Yes [provider]  famotidine (PEPCID) 40 MG tablet Take 1 tablet (40 mg total) by mouth daily. 08/03/21  Yes Burns, [name], MD  fluticasone (FLONASE) 50 MCG/ACT nasal spray Place 2 sprays into both nostrils daily. 07/28/20  Yes Burns, [name], MD  gabapentin (NEURONTIN) 100 MG capsule Take 1 tab in morning, take 3 tabs at [facility] 02/07/22  Yes Burns, [name], MD  glipiZIDE (GLUCOTROL) 5 MG tablet Take 2 pills with breakfast and 1 pill with dinner 02/09/22  Yes Burns, [name], MD  metFORMIN (GLUCOPHAGE-XR) 500 MG 24 hr tablet TAKE 2 TABLETS BY MOUTH TWICE A DAY 01/03/22  Yes Burns, [name], MD  metoprolol tartrate (LOPRESSOR) 50 MG tablet Take 1 tablet (50 mg total) by mouth 2 (two) times daily. 08/25/21  Yes Minus Breeding, MD  Omega-3 Fatty Acids (FISH OIL PO) Take 1 tablet by mouth daily.   Yes [provider]  omeprazole (PRILOSEC) 40 MG capsule Take 1 capsule (40 mg total) by mouth daily. 08/03/21  Yes Burns, [name], MD  [facility] DELICA LANCETS 87O MISC TEST BLOOD SUGAR ONCE DAILY 01/23/18  Yes Binnie Rail, MD  [facility] VERIO test strip USE UP TO 4 TIMES A DAY AS DIRECTED 07/06/21  Yes  Binnie Rail, MD  simvastatin (ZOCOR) 40 MG tablet Take 1 tablet (40 mg total) by mouth every evening. 08/25/21  Yes Minus Breeding, MD  albuterol (VENTOLIN HFA) 108 (90 Base) MCG/ACT inhaler Inhale 2 puffs into the lungs every 6 (six) hours as needed for wheezing or shortness of breath. 08/24/21   Mannam, Hart Robinsons, MD  ELDERBERRY PO Take by mouth daily.    [provider]  HYDROcodone bit-homatropine (HYCODAN) 5-1.5 MG/5ML syrup Take 5 mLs by mouth every 6 (six) hours as needed for cough. 10/31/21   Mannam, Hart Robinsons, MD  levocetirizine (XYZAL) 5 MG tablet TAKE 1 TABLET BY MOUTH EVERY EVENING. 09/26/21   Burns, [name], MD  nitroGLYCERIN (NITROSTAT) 0.4 MG SL tablet Place 1 tablet (0.4 mg total) under the tongue every 5 (five) minutes as needed for chest pain. 08/25/21 08/10/07  Minus Breeding, MD  rivaroxaban (XARELTO) 20 MG TABS tablet TAKE 1 TABLET BY MOUTH DAILY WITH SUPPER 12/14/21   Minus Breeding, MD  triamcinolone cream (KENALOG) 0.1 %  09/12/17   [provider]    Current Outpatient Medications  Medication Sig Dispense Refill   cholecalciferol (VITAMIN D3) 25 MCG (1000 UNIT) tablet Take 1,000 Units by mouth daily.     famotidine (PEPCID) 40 MG tablet Take 1 tablet (40 mg total) by mouth daily. 90 tablet 1   fluticasone (FLONASE) 50 MCG/ACT nasal spray Place 2 sprays into both nostrils daily. 16 g 6   gabapentin (NEURONTIN) 100 MG capsule Take 1 tab in morning, take 3 tabs at HS 120 capsule 5   glipiZIDE (GLUCOTROL) 5 MG tablet Take 2 pills with breakfast and 1 pill with dinner 270 tablet 1   metFORMIN (GLUCOPHAGE-XR) 500 MG 24 hr tablet TAKE 2 TABLETS BY MOUTH TWICE A DAY 120 tablet 1   metoprolol tartrate (LOPRESSOR) 50 MG tablet Take 1 tablet (50 mg total) by mouth 2 (two) times daily. 60 tablet 11   Omega-3 Fatty Acids (FISH OIL PO) Take 1 tablet by mouth daily.     omeprazole (PRILOSEC) 40 MG capsule Take 1 capsule (40 mg total) by mouth daily. 30 capsule 5   ONETOUCH DELICA LANCETS 44I MISC TEST BLOOD SUGAR ONCE DAILY 100 each 3   ONETOUCH VERIO test strip USE UP TO 4 TIMES A DAY AS DIRECTED 100 strip 0   simvastatin (ZOCOR) 40 MG tablet Take 1 tablet (40 mg total) by mouth every evening. 90 tablet 2   albuterol (VENTOLIN HFA) 108 (90 Base) MCG/ACT inhaler Inhale 2 puffs into the lungs every 6 (six) hours as needed for wheezing or shortness of breath. 8 g 6   ELDERBERRY PO Take by mouth daily.     HYDROcodone bit-homatropine (HYCODAN) 5-1.5 MG/5ML syrup Take 5 mLs by mouth every 6 (six) hours as needed for cough. 240 mL 0   levocetirizine (XYZAL) 5 MG tablet TAKE 1 TABLET BY MOUTH EVERY EVENING. 30 tablet 5   nitroGLYCERIN (NITROSTAT) 0.4 MG SL tablet Place 1 tablet (0.4 mg total) under the tongue every 5 (five) minutes as needed for chest pain. 100  tablet 3   rivaroxaban (XARELTO) 20 MG TABS tablet TAKE 1 TABLET BY MOUTH DAILY WITH SUPPER 30 tablet 5   triamcinolone cream (KENALOG) 0.1 %   3   Current Facility-Administered Medications  Medication Dose Route Frequency Provider Last Rate Last Admin   0.9 %  sodium chloride infusion  500 mL Intravenous Once Gatha Mayer, MD  Allergies as of 03/01/2022 - Review Complete 03/01/2022  Allergen Reaction Noted   Amlodipine besylate     Ivp dye [iodinated contrast media]      Family History  Problem Relation Age of Onset   Heart attack Mother 27   Diabetes Father        IDDM   Stroke Father 50   Asthma Sister    Prostate cancer Brother    Hemochromatosis Brother    Colon cancer Brother 17   Heart attack Brother 50   Heart attack Brother 85   Esophageal cancer Neg Hx    Rectal cancer Neg Hx    Stomach cancer Neg Hx     Social History   Socioeconomic History   Marital status: Married    Spouse name: [name]   Number of children: 2   Years of education: Not on file   Highest education level: Not on file  Occupational History   Occupation: Press photographer Manager-retired  Tobacco Use   Smoking status: Never   Smokeless tobacco: Never  Vaping Use   Vaping Use: Never used  Substance and Sexual Activity   Alcohol use: No    Alcohol/week: 0.0 standard drinks of alcohol   Drug use: No   Sexual activity: Yes  Other Topics Concern   Not on file  Social History Narrative   Not on file   Social Determinants of Health   Financial Resource Strain: Low Risk  (08/02/2021)   Overall Financial Resource Strain (CARDIA)    Difficulty of Paying Living Expenses: Not hard at all  Food Insecurity: No Food Insecurity (02/13/2022)   Hunger Vital Sign    Worried About Running Out of Food in the Last Year: Never true    Ran Out of Food in the Last Year: Never true  Transportation Needs: No Transportation Needs (02/13/2022)   PRAPARE - Hydrologist (Medical):  No    Lack of Transportation (Non-Medical): No  Physical Activity: Inactive (08/02/2021)   Exercise Vital Sign    Days of Exercise per Week: 0 days    Minutes of Exercise per Session: 0 min  Stress: No Stress Concern Present (08/02/2021)   [place]    Feeling of Stress : Not at all  Social Connections: [place] (08/02/2021)   Social Connection and Isolation Panel [NHANES]    Frequency of Communication with Friends and Family: More than three times a week    Frequency of Social Gatherings with Friends and Family: Once a week    Attends Religious Services: More than 4 times per year    Active Member of [employer] or Organizations: No    Attends Music therapist: 1 to 4 times per year    Marital Status: Married  Human resources officer Violence: Not At Risk (08/02/2021)   Humiliation, Afraid, Rape, and Kick questionnaire    Fear of Current or Ex-Partner: No    Emotionally Abused: No    Physically Abused: No    Sexually Abused: No    Review of Systems:  All other review of systems negative except as mentioned in the HPI.  Physical Exam: Vital signs BP 130/70   Pulse 84   Temp (!) 97.3 F (36.3 C)   Ht '5\' 8"'$  (1.727 m)   Wt 174 lb (78.9 kg)   SpO2 94%   BMI 26.46 kg/m   General:   Alert,  Well-developed, well-nourished, pleasant and cooperative in NAD  Lungs:  Clear throughout to auscultation.   Heart:  Regular rate and rhythm; no murmurs, clicks, rubs,  or gallops. Abdomen:  Soft, nontender and nondistended. Normal bowel sounds.   Neuro/Psych:  Alert and cooperative. Normal mood and affect. A and O x 3   '@[employer]'$  Simonne Maffucci, MD, [facility] [phone] (pager) 03/01/2022 3:37 PM@

## 2022-03-01 NOTE — Op Note (Signed)
[place] Patient Name: Todd [name] Procedure Date: 03/01/2022 3:21 PM MRN: [id number] Endoscopist: Gatha Mayer , MD Age: 81 Referring MD:  Date of Birth: Dec 05, 1940 Gender: Male Account #: [id number] Procedure:                Colonoscopy Indications:              Change in bowel habits Medicines:                Monitored Anesthesia Care Procedure:                Pre-Anesthesia Assessment:                           - Prior to the procedure, a History and Physical                            was performed, and patient medications and                            allergies were reviewed. The patient's tolerance of                            previous anesthesia was also reviewed. The risks                            and benefits of the procedure and the sedation                            options and risks were discussed with the patient.                            All questions were answered, and informed consent                            was obtained. Prior Anticoagulants: The patient                            last took Xarelto (rivaroxaban) 2 days prior to the                            procedure. ASA Grade Assessment: III - A patient                            with severe systemic disease. After reviewing the                            risks and benefits, the patient was deemed in                            satisfactory condition to undergo the procedure.                           After obtaining informed consent, the colonoscope  was passed under direct vision. Throughout the                            procedure, the patient's blood pressure, pulse, and                            oxygen saturations were monitored continuously. The                            CF [id number] #[id number] was introduced through the anus                            and advanced to the the cecum, identified by                            appendiceal orifice and ileocecal valve.  The                            colonoscopy was performed with moderate difficulty                            due to significant looping. Successful completion                            of the procedure was aided by applying abdominal                            pressure. The patient tolerated the procedure well.                            The quality of the bowel preparation was good. The                            appendiceal orifice and the rectum were                            photographed. The bowel preparation used was                            Miralax via split dose instruction. Scope In: 3:46:41 PM Scope Out: 4:09:31 PM Scope Withdrawal Time: 0 hours 14 minutes 43 seconds  Total Procedure Duration: 0 hours 22 minutes 50 seconds  Findings:                 The perianal and digital rectal examinations were                            normal.                           A 5 mm polyp was found in the ascending colon. The                            polyp was sessile. The polyp was removed with a  cold snare. Resection and retrieval were complete.                            Verification of patient identification for the                            specimen was done. Estimated blood loss was minimal.                           A 1 mm polyp was found in the transverse colon. The                            polyp was sessile. The polyp was removed with a                            cold biopsy forceps. Resection and retrieval were                            complete. Verification of patient identification                            for the specimen was done. Estimated blood loss was                            minimal.                           Internal hemorrhoids were found.                           The exam was otherwise without abnormality on                            direct and retroflexion views. Complications:            No immediate complications. Estimated  Blood Loss:     Estimated blood loss was minimal. Impression:               - One 5 mm polyp in the ascending colon, removed                            with a cold snare. Resected and retrieved.                           - One 1 mm polyp in the transverse colon, removed                            with a cold biopsy forceps. Resected and retrieved.                           - Internal hemorrhoids.                           - The examination was otherwise normal on direct  and retroflexion views. Recommendation:           - Patient has a contact number available for                            emergencies. The signs and symptoms of potential                            delayed complications were discussed with the                            patient. Return to normal activities tomorrow.                            Written discharge instructions were provided to the                            patient.                           - Resume previous diet.                           - Continue present medications.                           - Await pathology results.                           - No repeat colonoscopy due to age.                           - Take 1 tablespoon of Benefiber daily to see if                            that helps bowel habit changes and fecal smearing.                           - Resume Xarelto (rivaroxaban) at prior dose                            tomorrow. Gatha Mayer, MD 03/01/2022 4:18:36 PM This report has been signed electronically.

## 2022-03-01 NOTE — Patient Instructions (Addendum)
I found and removed 2 tiny polyps that look benign - these do not have anything to do with your symptoms.  I saw your hemorrhoids.  I want you to tale 1 tablespoon of Benefiber each day to see if that helps your bowel movements and the leakage.  Resume Xarelto tomorrow.  I appreciate the opportunity to care for you. Gatha Mayer, MD, [facility]    Handouts were given to your care partner on polyps and diverticulosis. Your sugar was 70 in the recovery room.  After he was awake, drank OJ.  Retook  Resume your XARELTO at prior dose tomorrow. Take 1 Tablespoon of Benifiber daily to see if that helps bowel habit changes and fecal smearing. You may resume your current medications today. Await biopsy results.  May take 1-3 weeks to receive pathology results. Please call if any questions or concerns.   YOU HAD AN ENDOSCOPIC PROCEDURE TODAY AT [facility]:   Refer to the procedure report that was given to you for any specific questions about what was found during the examination.  If the procedure report does not answer your questions, please call your gastroenterologist to clarify.  If you requested that your care partner not be given the details of your procedure findings, then the procedure report has been included in a sealed envelope for you to review at your convenience later.  YOU SHOULD EXPECT: Some feelings of bloating in the abdomen. Passage of more gas than usual.  Walking can help get rid of the air that was put into your GI tract during the procedure and reduce the bloating. If you had a lower endoscopy (such as a colonoscopy or flexible sigmoidoscopy) you may notice spotting of blood in your stool or on the toilet paper. If you underwent a bowel prep for your procedure, you may not have a normal bowel movement for a few days.  Please Note:  You might notice some irritation and congestion in your nose or some drainage.  This is from the oxygen used during your procedure.   There is no need for concern and it should clear up in a day or so.  SYMPTOMS TO REPORT IMMEDIATELY:  Following lower endoscopy (colonoscopy or flexible sigmoidoscopy):  Excessive amounts of blood in the stool  Significant tenderness or worsening of abdominal pains  Swelling of the abdomen that is new, acute  Fever of 100F or higher  For urgent or emergent issues, a gastroenterologist can be reached at any hour by calling [phone]. Do not use MyChart messaging for urgent concerns.    DIET:  We do recommend a small meal at first, but then you may proceed to your regular diet.  Drink plenty of fluids but you should avoid alcoholic beverages for 24 hours.  ACTIVITY:  You should plan to take it easy for the rest of today and you should NOT DRIVE or use heavy machinery until tomorrow (because of the sedation medicines used during the test).    FOLLOW UP: Our staff will call the number listed on your records the next business day following your procedure.  We will call around 7:15- 8:00 am to check on you and address any questions or concerns that you may have regarding the information given to you following your procedure. If we do not reach you, we will leave a message.  If you develop any symptoms (ie: fever, flu-like symptoms, shortness of breath, cough etc.) before then, please call [phone].  If you test positive  for Covid 19 in the 2 weeks post procedure, please call and report this information to [place].    If any biopsies were taken you will be contacted by phone or by letter within the next 1-3 weeks.  Please call us at [phone] if you have not heard about the biopsies in 3 weeks.    SIGNATURES/CONFIDENTIALITY: You and/or your care partner have signed paperwork which will be entered into your electronic medical record.  These signatures attest to the fact that that the information above on your After Visit Summary has been reviewed and is understood.  Full responsibility of  the confidentiality of this discharge information lies with you and/or your care-partner.

## 2022-03-01 NOTE — Progress Notes (Signed)
A/ox3, pleased with MAC, report to RN 

## 2022-03-01 NOTE — Progress Notes (Signed)
Called to room to assist during endoscopic procedure.  Patient ID and intended procedure confirmed with present staff. Received instructions for my participation in the procedure from the performing physician.  

## 2022-03-01 NOTE — Progress Notes (Signed)
Per Dr. Carlean Purl pt is aware pt sugar was 70 at admission to recovery.  No symptoms noted.  After pt was awake and abe to swallow, he was given 2 cups of OJ.  After several minutes retook sugar and it dropped to 60.  Again, no symptoms noted.  Dr. Carlean Purl was advised and he said he did not need IV glucose intervention.  Pt was given more juice and cracker.  Waited several minutes and again retook sugar and it increased to 76.  Pt denied and symptoms and pt said he was ready to be discharged.  maw

## 2022-03-02 ENCOUNTER — Telehealth: Payer: Self-pay | Admitting: *Deleted

## 2022-03-02 NOTE — Telephone Encounter (Signed)
Follow up call attempt.  LVM

## 2022-03-06 ENCOUNTER — Encounter: Payer: Self-pay | Admitting: Internal Medicine

## 2022-03-14 DIAGNOSIS — G245 Blepharospasm: Secondary | ICD-10-CM | POA: Diagnosis not present

## 2022-03-15 ENCOUNTER — Encounter: Payer: Self-pay | Admitting: *Deleted

## 2022-04-10 ENCOUNTER — Ambulatory Visit: Payer: HMO | Admitting: Pulmonary Disease

## 2022-04-19 ENCOUNTER — Encounter: Payer: Self-pay | Admitting: Pulmonary Disease

## 2022-04-19 ENCOUNTER — Other Ambulatory Visit: Payer: Self-pay | Admitting: Internal Medicine

## 2022-04-19 ENCOUNTER — Ambulatory Visit: Payer: HMO | Admitting: Pulmonary Disease

## 2022-04-19 VITALS — BP 144/76 | HR 83 | Temp 98.4°F | Ht 68.0 in | Wt 175.0 lb

## 2022-04-19 DIAGNOSIS — Z5181 Encounter for therapeutic drug level monitoring: Secondary | ICD-10-CM | POA: Diagnosis not present

## 2022-04-19 DIAGNOSIS — R053 Chronic cough: Secondary | ICD-10-CM | POA: Diagnosis not present

## 2022-04-19 DIAGNOSIS — J849 Interstitial pulmonary disease, unspecified: Secondary | ICD-10-CM | POA: Diagnosis not present

## 2022-04-19 MED ORDER — ALBUTEROL SULFATE HFA 108 (90 BASE) MCG/ACT IN AERS: 2.0000 | INHALATION_SPRAY | Freq: Four times a day (QID) | RESPIRATORY_TRACT | 6 refills | Status: DC | PRN

## 2022-04-19 MED ORDER — PREDNISONE 20 MG PO TABS: ORAL_TABLET | ORAL | 0 refills | Status: DC

## 2022-04-19 MED ORDER — AZITHROMYCIN 250 MG PO TABS: ORAL_TABLET | ORAL | 0 refills | Status: DC

## 2022-04-19 NOTE — Progress Notes (Signed)
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 with history of allergies, rhinitis, atrial fibrillation, hypertension, hyperlipidemia, diabetes, GERD Follow-up for asbestosis, chronic cough, COVID-19 infection in September 2021  Developed COVID-19 in September 2021.  He was sick for about a week.  Treated with outpatient monoclonal antibody therapy and did not require hospitalization His cough has worsened since his COVID-19 infection.  He was put on prednisone for a month with slow taper with improvement in symptoms Continues to have persistent cough.  ENT examination in 2022 was normal  Started Esbriet in January 2023 for progressive pulmonary fibrosis.  But had to stop in 3 months after he developed significant sunburn.  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   His chief complaint is cough which has been resistant to treatment Reports increasing chest congestion and yellow/green mucus.  Denies any fevers or chills  Outpatient Encounter Medications as of 04/19/2022  Medication Sig   albuterol (VENTOLIN HFA) 108 (90 Base) MCG/ACT inhaler Inhale 2 puffs into the lungs every 6 (six) hours as needed for wheezing or shortness of breath.   cholecalciferol (VITAMIN D3) 25 MCG (1000 UNIT) tablet Take 1,000 Units by mouth daily.   ELDERBERRY PO Take by mouth daily.   famotidine (PEPCID) 40 MG tablet Take 1 tablet (40 mg total) by mouth daily.   fluticasone (FLONASE) 50 MCG/ACT nasal spray Place 2 sprays into both nostrils  daily.   gabapentin (NEURONTIN) 100 MG capsule Take 1 tab in morning, take 3 tabs at HS   glipiZIDE (GLUCOTROL) 5 MG tablet Take 2 pills with breakfast and 1 pill with dinner   HYDROcodone bit-homatropine (HYCODAN) 5-1.5 MG/5ML syrup Take 5 mLs by mouth every 6 (six) hours as needed for cough.   levocetirizine (XYZAL) 5 MG tablet TAKE 1 TABLET BY MOUTH EVERY EVENING.   metFORMIN (GLUCOPHAGE-XR) 500 MG 24 hr tablet TAKE 2 TABLETS BY MOUTH TWICE A DAY   metoprolol tartrate (LOPRESSOR) 50 MG tablet Take 1 tablet (50 mg total) by mouth 2 (two) times daily.   Omega-3 Fatty Acids (FISH OIL PO) Take 1 tablet by mouth daily.   omeprazole (PRILOSEC) 40 MG capsule Take 1 capsule (40 mg total) by mouth daily.   ONETOUCH DELICA LANCETS 70V MISC TEST BLOOD SUGAR ONCE DAILY   ONETOUCH VERIO test strip USE UP TO 4 TIMES A DAY AS DIRECTED   rivaroxaban (XARELTO) 20 MG TABS tablet TAKE 1 TABLET BY MOUTH DAILY WITH SUPPER   simvastatin (ZOCOR) 40 MG tablet Take 1 tablet (40 mg total) by mouth every evening.   triamcinolone cream (KENALOG) 0.1 %    nitroGLYCERIN (NITROSTAT) 0.4 MG SL tablet Place 1 tablet (0.4 mg total) under the tongue every 5 (five) minutes as needed for chest pain. (Patient not taking: Reported on 04/19/2022)   Facility-Administered Encounter Medications as of 04/19/2022  Medication   0.9 %  sodium chloride infusion   Physical Exam: Blood pressure (!) 144/76, pulse 83, temperature 98.4 F (36.9 C), temperature source  Oral, height '5\' 8"'$  (1.727 m), weight 175 lb (79.4 kg), SpO2 98 %. Gen:      No acute distress HEENT:  EOMI, sclera anicteric Neck:     No masses; no thyromegaly Lungs:    Clear to auscultation bilaterally; normal respiratory effort CV:         Regular rate and rhythm; no murmurs Abd:      + bowel sounds; soft, non-tender; no palpable masses, no distension Ext:    No edema; adequate peripheral perfusion Skin:      Warm and dry; no rash Neuro: alert and oriented x 3 Psych:  normal mood and affect   Data Reviewed: Imaging Chest x-ray 12/04/16-stable interstitial opacities CT abdomen 11/20/13-mild reticular opacities at the bases CT high-resolution 01/02/17- subpleural reticulation, mild traction bronchiectasis, possible early honeycombing with basal gradient. 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  Slightly worse compared to 2018 High-res CT 03/21/2019-probable UIP High-res CT 03/30/2020-unchanged probable UIP fibrosis High-res CT 09/02/2020- unchanged probable UIP fibrosis High-res CT 06/30/2021-slight worsening of fibrosis and probable UIP pattern I have reviewed the images personally  PFTs  03/20/17- FVC 2.74 (72%], FEV1 2.38 (87%), F/F 87, TLC 62%, DLCO 15.89 (56%)  01/15/2018-FVC 2.74 [73%], FEV1 2.33 [86%], F/F 85, TLC 71%, DLCO 13.13 (46%)  07/23/2018-FVC 2.51 [69%), FEV1 2.10 [78%), DLCO 15.16 (53%) Mild restriction, moderate-severe diffusion defect.  04/02/2019 FVC 2.70 [72%], FEV1 2.18 [82%], F/F 81, DLCO 13.8 [60%] Moderate diffusion defect  08/31/2020 FVC 2.52 [65%], FEV1 2.25 [82%], F/F 89, TLC 4.64 [69%], DLCO 20.59 [88%] Mild restriction.  Improvement in diffusion capacity  08/24/2021 FVC 2.03 [53%], FEV1 1.53 [56%], F/F 75, TLC 2.78 [41%], DLCO 13.45 [58%] Severe restriction, moderate diffusion defect  FENO 12/18/16- 12  Labs ILD serologies 01/09/17- all negative except for mild elevation in SCL 75.  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  PFTs today show significant worsening of restriction and diffusion impairment.  Started Esbriet in January 2023 but we had to stop it as he developed significant sunburn LFTs are normal  We discussed alternate medications but he does not want to try the Ofev right now as he  already has diarrhea Order high-res CT and PFTs in 3 months for reevaluation  Chronic cough Likely secondary to pulmonary fibrosis He has been treated adequately for GERD and tried on antihistamines which did not help ENT examination in 2022 is unremarkable He has some improvement with codeine cough syrup.   Recently started on gabapentin by primary care which has not helped much  Increased cough today may be secondary to bronchitis.  Prescribed Z-Pak and prednisone We discussed enrollment in clinical trials for cough but he wants to think about it  Consider thalidomide for treatment of IPF related cough he continues to have persistent symptoms.  But data for those is not strong.  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  He had mild symptoms which did not require hospitalization  Health maintenance 01/15/2015-Prevnar 13 05/05/2010-Pneumovax  Plan/Recommendations: Z-Pak, prednisone High-res CT and PFTs in 16-month PMarshell GarfinkelMD [facility] 04/19/2022, 2:06 PM  CC: BBinnie Rail MD

## 2022-04-19 NOTE — Addendum Note (Signed)
Addended by: Elton Sin on: 04/19/2022 03:12 PM   Modules accepted: Orders

## 2022-04-19 NOTE — Patient Instructions (Signed)
We will call in a Z-Pak and prednisone 40 mg a day for 5 days for bronchitis and cough Schedule high-res CT and PFTs in 3 months and follow-up in clinic after these tests.

## 2022-05-08 ENCOUNTER — Other Ambulatory Visit: Payer: Self-pay | Admitting: Internal Medicine

## 2022-05-18 ENCOUNTER — Ambulatory Visit (INDEPENDENT_AMBULATORY_CARE_PROVIDER_SITE_OTHER): Payer: HMO

## 2022-05-18 ENCOUNTER — Ambulatory Visit (INDEPENDENT_AMBULATORY_CARE_PROVIDER_SITE_OTHER): Payer: HMO | Admitting: Emergency Medicine

## 2022-05-18 ENCOUNTER — Encounter: Payer: Self-pay | Admitting: Emergency Medicine

## 2022-05-18 VITALS — BP 116/68 | HR 90 | Temp 97.9°F | Ht 68.0 in | Wt 175.5 lb

## 2022-05-18 DIAGNOSIS — Z23 Encounter for immunization: Secondary | ICD-10-CM | POA: Diagnosis not present

## 2022-05-18 DIAGNOSIS — R053 Chronic cough: Secondary | ICD-10-CM

## 2022-05-18 DIAGNOSIS — J8410 Pulmonary fibrosis, unspecified: Secondary | ICD-10-CM | POA: Diagnosis not present

## 2022-05-18 MED ORDER — TRELEGY ELLIPTA 100-62.5-25 MCG/ACT IN AEPB: 1.0000 | INHALATION_SPRAY | Freq: Every day | RESPIRATORY_TRACT | 11 refills | Status: DC

## 2022-05-18 NOTE — Assessment & Plan Note (Signed)
Clinically stable. No new findings on chest x-ray. No signs of infection. Follow-up with pulmonary doctor and PCP

## 2022-05-18 NOTE — Patient Instructions (Signed)
Pulmonary Fibrosis  Pulmonary fibrosis is a type of lung disease that causes scarring. Over time, the scar tissue builds up in the air sacs of your lungs (alveoli). This makes it hard for you to breathe because less oxygen gets into your bloodstream. Scarring from pulmonary fibrosis is permanent and may lead to other serious health problems. What are the causes? There are many different causes of pulmonary fibrosis. In some cases, the cause is not known. This is called idiopathic pulmonary fibrosis. Other causes include: Exposure to chemicals and substances found in agricultural, farm, construction, or factory work. These include mold, asbestos, silica, metal dusts, and toxic fumes. Sarcoidosis. In this disease, areas of inflammatory cells (granulomas) form and most often affect the lungs. Autoimmune diseases. These include diseases such as rheumatoid arthritis, systemic sclerosis, or connective tissue disease. Taking certain medicines. These include drugs used in radiation therapy or used to treat seizures, heart problems, and some infections. What increases the risk? You are more likely to develop this condition if: You have a family history of the disease. You are an older person. The condition is more common in older adults. You have a history of smoking. You have a job that exposes you to certain chemicals. You have gastroesophageal reflux disease (GERD). What are the signs or symptoms? Symptoms of this condition include: Difficulty breathing that gets worse with activity. Shortness of breath (dyspnea). Dry, hacking cough. Rapid, shallow breathing during exercise or while at rest. Other symptoms may include: Loss of appetite or weight loss Tiredness (fatigue) or weakness. Bluish skin and lips. Rounded and enlarged fingertips (clubbing). How is this diagnosed? This condition may be diagnosed based on: Your symptoms and medical history. A physical exam. You may also have tests,  including: A test that involves looking inside your lungs with an instrument (bronchoscopy). Imaging studies of your lungs and heart. Tests to measure how well you are breathing (pulmonary function tests). Blood tests. Tests to see how well your lungs work while you are walking (pulmonary stress test). A procedure to remove a lung tissue sample to look at it under a microscope (biopsy). How is this treated? There is no cure for pulmonary fibrosis. Treatment focuses on managing symptoms and preventing scarring from getting worse. This may include: Medicines, such as: Steroids to prevent permanent lung changes. Medicines to suppress your body's defense system (immune system). Medicines to help with lung function by reducing inflammation or scarring. Ongoing monitoring with X-rays and lab work. Oxygen therapy. Pulmonary rehabilitation. Surgery. In some cases, a lung transplant is possible. Follow these instructions at home:  Medicines Take over-the-counter and prescription medicines only as told by your health care provider. Keep your vaccinations up to date as recommended by your health care provider. Activity Get regular exercise, but do not pick activities that are too strenuous for you. Ask your health care provider what activities are safe for you. If you have physical limitations, you may get exercise by walking, using a stationary bike, or doing chair exercises. Ask your health care provider about using oxygen while exercising. Do breathing exercises as told by your health care provider. Plan rest periods when you get tired. General instructions Do not use any products that contain nicotine or tobacco. These products include cigarettes, chewing tobacco, and vaping devices, such as e-cigarettes. If you need help quitting, ask your health care provider. If you are exposed to chemicals and substances at work, make sure that you wear a mask or respirator at all times. Learn to   manage  stress. If you need help to do this, ask your health care provider. Join a pulmonary rehabilitation program or a support group for people with pulmonary fibrosis. Eat small meals often so you do not get too full. Overeating can make breathing trouble worse. Maintain a healthy weight. Lose weight if you need to. Keep all follow-up visits. This is important. Where to find more information American Lung Association: www.lung.org National Heart, Lung, and Blood Institute: www.nhlbi.nih.gov Pulmonary Fibrosis Foundation: pulmonaryfibrosis.org Contact a health care provider if: You have symptoms that do not get better with medicines. You are not able to be as active as usual. You have trouble taking a deep breath. You have a fever or chills. You have blue lips or skin. You have a lot of headaches. You cough up mucus that is dark in color. You have feelings of depression or sadness. You are unable to sleep because it is hard to breathe. Get help right away if: Your symptoms suddenly worsen. You have chest pain. You cough up blood. You get very confused or sleepy. These symptoms may be an emergency. Get help right away. Call 911. Do not wait to see if the symptoms will go away. Do not drive yourself to the hospital. Summary Pulmonary fibrosis is a type of lung disease that causes scar tissue to build up in the air sacs of your lungs (alveoli) over time. This makes it hard for you to breathe because less oxygen gets into your bloodstream. Scarring from pulmonary fibrosis is permanent and may lead to other serious health problems. You are more likely to develop this condition if you have a family history of the condition or a job that exposes you to certain chemicals. There is no cure for pulmonary fibrosis. Treatment focuses on managing symptoms and preventing scarring from getting worse. This information is not intended to replace advice given to you by your health care provider. Make sure  you discuss any questions you have with your health care provider. Document Revised: 03/29/2021 Document Reviewed: 03/29/2021 Elsevier Patient Education  2023 [employer]  

## 2022-05-18 NOTE — Progress Notes (Signed)
Todd [name] 81 y.o.   Chief Complaint  Patient presents with   Cough    Patient states he had the cough since COVID a few years . Patient see pulmonologist, ENT     HISTORY OF PRESENT ILLNESS: Acute problem visit today.  Patient of Dr. Billey Gosling. This is a 81 y.o. 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 No other complaints or medical concerns Cough is productive of clear phlegm.  Denies fever or chills.  Denies difficulty breathing or wheezing.  Cough Pertinent negatives include no chest pain, chills, fever, headaches, rash or sore throat.     Prior to Admission medications   Medication Sig Start Date End Date Taking? Authorizing Provider  albuterol (VENTOLIN HFA) 108 (90 Base) MCG/ACT inhaler Inhale 2 puffs into the lungs every 6 (six) hours as needed for wheezing or shortness of breath. 04/19/22  Yes Mannam, Praveen, MD  cholecalciferol (VITAMIN D3) 25 MCG (1000 UNIT) tablet Take 1,000 Units by mouth daily.   Yes [provider]  famotidine (PEPCID) 40 MG tablet Take 1 tablet (40 mg total) by mouth daily. 08/03/21  Yes Burns, [name], MD  fluticasone (FLONASE) 50 MCG/ACT nasal spray Place 2 sprays into both nostrils daily. 07/28/20  Yes Burns, [name], MD  gabapentin (NEURONTIN) 100 MG capsule Take 1 tab in morning, take 3 tabs at [facility] 02/07/22  Yes Burns, [name], MD  glipiZIDE (GLUCOTROL) 5 MG tablet Take 2 pills with breakfast and 1 pill with dinner 02/09/22  Yes Burns, [name], MD  HYDROcodone bit-homatropine (HYCODAN) 5-1.5 MG/5ML syrup Take 5 mLs by mouth every 6 (six) hours as needed for cough. 10/31/21  Yes Mannam, Praveen, MD  levocetirizine (XYZAL) 5 MG tablet TAKE 1 TABLET BY MOUTH EVERY EVENING. 09/26/21  Yes Burns, [name], MD  metFORMIN (GLUCOPHAGE-XR) 500 MG 24 hr tablet TAKE 2 TABLETS BY MOUTH TWICE A DAY 04/19/22  Yes Burns, [name],  MD  metoprolol tartrate (LOPRESSOR) 50 MG tablet Take 1 tablet (50 mg total) by mouth 2 (two) times daily. 08/25/21  Yes Minus Breeding, MD  nitroGLYCERIN (NITROSTAT) 0.4 MG SL tablet Place 1 tablet (0.4 mg total) under the tongue every 5 (five) minutes as needed for chest pain. 08/25/21 08/10/07 Yes Minus Breeding, MD  Omega-3 Fatty Acids (FISH OIL PO) Take 1 tablet by mouth daily.   Yes [provider]  omeprazole (PRILOSEC) 40 MG capsule TAKE 1 CAPSULE BY MOUTH 2 TIMES DAILY. 05/08/22  Yes Burns, [name], MD  [facility] DELICA LANCETS 38H MISC TEST BLOOD SUGAR ONCE DAILY 01/23/18  Yes Binnie Rail, MD  [facility] VERIO test strip USE UP TO 4 TIMES A DAY AS DIRECTED 07/06/21  Yes Burns, [name], MD  rivaroxaban (XARELTO) 20 MG TABS tablet TAKE 1 TABLET BY MOUTH DAILY WITH SUPPER 12/14/21  Yes Minus Breeding, MD  simvastatin (ZOCOR) 40 MG tablet Take 1 tablet (40 mg total) by mouth every evening. 08/25/21  Yes Minus Breeding, MD    Allergies  Allergen Reactions   Amlodipine Besylate     Rash Because of a history of documented adverse serious drug reaction;Medi Alert bracelet  is recommended   Ivp Dye [Iodinated Contrast Media]     Rash Because of a history of documented adverse serious drug reaction;Medi Alert bracelet  is recommended    Patient Active Problem List   Diagnosis Date  Noted   B12 deficiency 05/24/2021   Fatigue 05/23/2021   Unsteady gait 05/23/2021   Productive cough 05/06/2021   Incontinence of feces 05/06/2021   Weight loss 04/15/2021   Rotator cuff arthropathy 03/28/2021   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   Sinus drainage 07/28/2020   Aortic atherosclerosis ([place]) 05/07/2020   GERD (gastroesophageal reflux disease) 05/06/2019   Pulmonary fibrosis ([place]) 05/05/2019   Asbestosis ([place]) 05/05/2019   Ventral hernia without obstruction or gangrene 29/56/2130   Umbilical hernia without obstruction or gangrene 10/31/2018   Eczema 10/29/2017   Hives  08/13/2017   Chronic Cough 12/04/2016   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   Hemorrhoids, internal, with bleeding 01/15/2015   Bursitis of left shoulder 01/14/2015   Type 2 diabetes, controlled, with peripheral circulatory disorder ([place]) 12/05/2013   PVD (peripheral vascular disease) ([place]) 11/12/2013   Atrial fibrillation ([place]) 11/03/2013   Carotid artery disease ([place]) 12/20/2009   OVERWEIGHT/OBESITY 11/17/2008   Hyperlipidemia 07/20/2008   Essential hypertension 07/20/2008   Coronary atherosclerosis 07/20/2008   ALLERGIC RHINITIS 07/20/2008   NEPHROLITHIASIS, HX OF 07/20/2008    Past Medical History:  Diagnosis Date   Allergic rhinitis    Asthma    Atrial fibrillation with rapid ventricular response ([place])    a. newly diagnosed 11/03/13, spont conv to NSR, placed on xarelto.   CAD (coronary artery disease)    a. 1999; s/p CABG: LIMA to LAD, SVG to OM1, SVG to OM2, SVG to RCA  b. Normal nuc 10/2013 (done because of new onset AF.)   COVID    Diabetes mellitus ([place])    HLD (hyperlipidemia)    HTN (hypertension)    Nephrolithiasis    Overweight(278.02)    PVD (peripheral vascular disease) ([place])    Carotid stenosis   Stroke [facility])     Past Surgical History:  Procedure Laterality Date   CAROTID ENDARTERECTOMY  1999   COLONOSCOPY  2014   negative;Dr Sharlett Iles   COLONOSCOPY W/ POLYPECTOMY  2009   Dr Sharlett Iles   CORONARY ARTERY BYPASS GRAFT  Aug.1999   HEMORRHOID BANDING     INTRACAPSULAR CATARACT EXTRACTION Bilateral 2018   NASAL SINUS SURGERY      Social History   Socioeconomic History   Marital status: Married    Spouse name: [name]   Number of children: 2   Years of education: Not on file   Highest education level: Not on file  Occupational History   Occupation: Press photographer Manager-retired  Tobacco Use   Smoking status: Never   Smokeless tobacco: Never  Vaping Use   Vaping Use: Never used  Substance and Sexual Activity   Alcohol use: No    Alcohol/week: 0.0 standard  drinks of alcohol   Drug use: No   Sexual activity: Yes  Other Topics Concern   Not on file  Social History Narrative   Not on file   Social Determinants of Health   Financial Resource Strain: Low Risk  (08/02/2021)   Overall Financial Resource Strain (CARDIA)    Difficulty of Paying Living Expenses: Not hard at all  Food Insecurity: No Food Insecurity (02/13/2022)   Hunger Vital Sign    Worried About Running Out of Food in the Last Year: Never true    Ran Out of Food in the Last Year: Never true  Transportation Needs: No Transportation Needs (02/13/2022)   PRAPARE - Hydrologist (Medical): No    Lack of Transportation (Non-Medical): No  Physical Activity: Inactive (08/02/2021)  Exercise Vital Sign    Days of Exercise per Week: 0 days    Minutes of Exercise per Session: 0 min  Stress: No Stress Concern Present (08/02/2021)   [place]    Feeling of Stress : Not at all  Social Connections: [place] (08/02/2021)   Social Connection and Isolation Panel [NHANES]    Frequency of Communication with Friends and Family: More than three times a week    Frequency of Social Gatherings with Friends and Family: Once a week    Attends Religious Services: More than 4 times per year    Active Member of [employer] or Organizations: No    Attends Music therapist: 1 to 4 times per year    Marital Status: Married  Human resources officer Violence: Not At Risk (08/02/2021)   Humiliation, Afraid, Rape, and Kick questionnaire    Fear of Current or Ex-Partner: No    Emotionally Abused: No    Physically Abused: No    Sexually Abused: No    Family History  Problem Relation Age of Onset   Heart attack Mother 23   Diabetes Father        IDDM   Stroke Father 50   Asthma Sister    Prostate cancer Brother    Hemochromatosis Brother    Colon cancer Brother 90   Heart attack Brother 85    Heart attack Brother 50   Esophageal cancer Neg Hx    Rectal cancer Neg Hx    Stomach cancer Neg Hx      Review of Systems  Constitutional: Negative.  Negative for chills and fever.  HENT: Negative.  Negative for congestion and sore throat.   Respiratory:  Positive for cough.   Cardiovascular: Negative.  Negative for chest pain and palpitations.  Gastrointestinal:  Negative for abdominal pain, nausea and vomiting.  Genitourinary: Negative.  Negative for dysuria and hematuria.  Skin: Negative.  Negative for rash.  Neurological: Negative.  Negative for dizziness and headaches.  All other systems reviewed and are negative.  Today's Vitals   05/18/22 1106  BP: 116/68  Pulse: 90  Temp: 97.9 F (36.6 C)  TempSrc: Oral  SpO2: 90%  Weight: 175 lb 8 oz (79.6 kg)  Height: '5\' 8"'$  (1.727 m)   Body mass index is 26.68 kg/m.   Physical Exam Vitals reviewed.  Constitutional:      Appearance: Normal appearance.  HENT:     Head: Normocephalic.     Mouth/Throat:     Mouth: Mucous membranes are moist.     Pharynx: Oropharynx is clear.  Eyes:     Extraocular Movements: Extraocular movements intact.     Pupils: Pupils are equal, round, and reactive to light.  Cardiovascular:     Rate and Rhythm: Normal rate and regular rhythm.  Pulmonary:     Effort: Pulmonary effort is normal.     Breath sounds: Rales (Dry crackles right more than left) present.  Musculoskeletal:        General: Normal range of motion.     Cervical back: No tenderness.  Lymphadenopathy:     Cervical: No cervical adenopathy.  Skin:    General: Skin is warm and dry.     Capillary Refill: Capillary refill takes less than 2 seconds.  Neurological:     General: No focal deficit present.     Mental Status: He is alert and oriented to person, place, and time.  Psychiatric:  Mood and Affect: Mood normal.        Behavior: Behavior normal.     DG Chest 2 View  Result Date: 05/18/2022 CLINICAL DATA:   Chronic cough, pulmonary fibrosis EXAM: CHEST - 2 VIEW COMPARISON:  05/06/2021 FINDINGS: Cardiomegaly status post median sternotomy and CABG. Unchanged diffuse fibrotic interstitial pulmonary opacity throughout the lungs. No new or acute airspace opacity. Disc degenerative disease of the thoracic spine. IMPRESSION: Unchanged pulmonary fibrosis.  No acute abnormality of the lungs. Electronically Signed   By: Delanna Ahmadi M.D.   On: 05/18/2022 12:10    ASSESSMENT & PLAN: A total of 41 minutes was spent with the patient and counseling/coordination of care regarding preparing for this visit, review of available medical records, review of most recent CT scan of chest, review of today's chest x-ray, review of multiple chronic medical problems and their management, review of all medications, diagnosis of pulmonary fibrosis and chronic cough, cough management, prognosis, documentation, need for follow-up.  Problem List Items Addressed This Visit       Respiratory   Pulmonary fibrosis ([place]) - Primary    Clinically stable. No new findings on chest x-ray. No signs of infection. Follow-up with pulmonary doctor and PCP      Relevant Medications   Fluticasone-Umeclidin-Vilant (TRELEGY ELLIPTA) 100-62.5-25 MCG/ACT AEPB     Other   Chronic Cough    Of 2-1/2 years duration.  Has tried all over-the-counter possible remedies without success.  Albuterol not helping. Coughing spells hard to predict. Tessalon Perles not useful Given his chronic pulmonary condition may benefit from trial of Trelegy once a day.  Will monitor response.      Relevant Medications   Fluticasone-Umeclidin-Vilant (TRELEGY ELLIPTA) 100-62.5-25 MCG/ACT AEPB   Other Relevant Orders   DG Chest 2 View (Completed)   Other Visit Diagnoses     Need for vaccination       Relevant Orders   Flu Vaccine QUAD High Dose(Fluad) (Completed)      Patient Instructions  Pulmonary Fibrosis  Pulmonary fibrosis is a type of lung disease  that causes scarring. Over time, the scar tissue builds up in the air sacs of your lungs (alveoli). This makes it hard for you to breathe because less oxygen gets into your bloodstream. Scarring from pulmonary fibrosis is permanent and may lead to other serious health problems. What are the causes? There are many different causes of pulmonary fibrosis. In some cases, the cause is not known. This is called idiopathic pulmonary fibrosis. Other causes include: Exposure to chemicals and substances found in agricultural, farm, Architect, or factory work. These include mold, asbestos, silica, metal dusts, and toxic fumes. Sarcoidosis. In this disease, areas of inflammatory cells (granulomas) form and most often affect the lungs. Autoimmune diseases. These include diseases such as rheumatoid arthritis, systemic sclerosis, or connective tissue disease. Taking certain medicines. These include drugs used in radiation therapy or used to treat seizures, heart problems, and some infections. What increases the risk? You are more likely to develop this condition if: You have a family history of the disease. You are an older person. The condition is more common in older adults. You have a history of smoking. You have a job that exposes you to certain chemicals. You have gastroesophageal reflux disease (GERD). What are the signs or symptoms? Symptoms of this condition include: Difficulty breathing that gets worse with activity. Shortness of breath (dyspnea). Dry, hacking cough. Rapid, shallow breathing during exercise or while at rest. Other symptoms may include:  Loss of appetite or weight loss Tiredness (fatigue) or weakness. Bluish skin and lips. Rounded and enlarged fingertips (clubbing). How is this diagnosed? This condition may be diagnosed based on: Your symptoms and medical history. A physical exam. You may also have tests, including: A test that involves looking inside your lungs with an  instrument (bronchoscopy). Imaging studies of your lungs and heart. Tests to measure how well you are breathing (pulmonary function tests). Blood tests. Tests to see how well your lungs work while you are walking (pulmonary stress test). A procedure to remove a lung tissue sample to look at it under a microscope (biopsy). How is this treated? There is no cure for pulmonary fibrosis. Treatment focuses on managing symptoms and preventing scarring from getting worse. This may include: Medicines, such as: Steroids to prevent permanent lung changes. Medicines to suppress your body's defense system (immune system). Medicines to help with lung function by reducing inflammation or scarring. Ongoing monitoring with X-rays and lab work. Oxygen therapy. Pulmonary rehabilitation. Surgery. In some cases, a lung transplant is possible. Follow these instructions at home:  Medicines Take over-the-counter and prescription medicines only as told by your health care provider. Keep your vaccinations up to date as recommended by your health care provider. Activity Get regular exercise, but do not pick activities that are too strenuous for you. Ask your health care provider what activities are safe for you. If you have physical limitations, you may get exercise by walking, using a stationary bike, or doing chair exercises. Ask your health care provider about using oxygen while exercising. Do breathing exercises as told by your health care provider. Plan rest periods when you get tired. General instructions Do not use any products that contain nicotine or tobacco. These products include cigarettes, chewing tobacco, and vaping devices, such as e-cigarettes. If you need help quitting, ask your health care provider. If you are exposed to chemicals and substances at work, make sure that you wear a mask or respirator at all times. Learn to manage stress. If you need help to do this, ask your health care  provider. Join a pulmonary rehabilitation program or a support group for people with pulmonary fibrosis. Eat small meals often so you do not get too full. Overeating can make breathing trouble worse. Maintain a healthy weight. Lose weight if you need to. Keep all follow-up visits. This is important. Where to find more information American Lung Association: www.lung.org National Heart, Lung, and Blood Institute: https://wilson-eaton.com/ Pulmonary Fibrosis Foundation: pulmonaryfibrosis.org Contact a health care provider if: You have symptoms that do not get better with medicines. You are not able to be as active as usual. You have trouble taking a deep breath. You have a fever or chills. You have blue lips or skin. You have a lot of headaches. You cough up mucus that is dark in color. You have feelings of depression or sadness. You are unable to sleep because it is hard to breathe. Get help right away if: Your symptoms suddenly worsen. You have chest pain. You cough up blood. You get very confused or sleepy. These symptoms may be an emergency. Get help right away. Call 911. Do not wait to see if the symptoms will go away. Do not drive yourself to the hospital. Summary Pulmonary fibrosis is a type of lung disease that causes scar tissue to build up in the air sacs of your lungs (alveoli) over time. This makes it hard for you to breathe because less oxygen gets into your bloodstream.  Scarring from pulmonary fibrosis is permanent and may lead to other serious health problems. You are more likely to develop this condition if you have a family history of the condition or a job that exposes you to certain chemicals. There is no cure for pulmonary fibrosis. Treatment focuses on managing symptoms and preventing scarring from getting worse. This information is not intended to replace advice given to you by your health care provider. Make sure you discuss any questions you have with your health care  provider. Document Revised: 03/29/2021 Document Reviewed: 03/29/2021 Elsevier Patient Education  [place], MD [facility]

## 2022-05-18 NOTE — Assessment & Plan Note (Signed)
Of 2-1/2 years duration.  Has tried all over-the-counter possible remedies without success.  Albuterol not helping. Coughing spells hard to predict. Tessalon Perles not useful Given his chronic pulmonary condition may benefit from trial of Trelegy once a day.  Will monitor response.

## 2022-06-06 ENCOUNTER — Other Ambulatory Visit: Payer: Self-pay | Admitting: Cardiology

## 2022-06-06 DIAGNOSIS — I10 Essential (primary) hypertension: Secondary | ICD-10-CM

## 2022-06-06 DIAGNOSIS — E785 Hyperlipidemia, unspecified: Secondary | ICD-10-CM

## 2022-06-06 DIAGNOSIS — I2510 Atherosclerotic heart disease of native coronary artery without angina pectoris: Secondary | ICD-10-CM

## 2022-06-06 DIAGNOSIS — E118 Type 2 diabetes mellitus with unspecified complications: Secondary | ICD-10-CM

## 2022-06-06 DIAGNOSIS — I4891 Unspecified atrial fibrillation: Secondary | ICD-10-CM

## 2022-06-06 NOTE — Telephone Encounter (Signed)
Prescription refill request for Xarelto received.  Indication: Afib  Last office visit: 1/5 23 ([place])  Weight: 79.6kg Age: 81 Scr: 0.92 (02/13/22)  CrCl: 73.20m/min  Appropriate dose and refill sent to requested pharmacy.

## 2022-06-22 ENCOUNTER — Encounter: Payer: Self-pay | Admitting: Internal Medicine

## 2022-06-22 NOTE — Progress Notes (Signed)
Subjective:    Patient ID: Todd [name], male    DOB: 1941-06-13, 81 y.o.   MRN: [id number]      HPI Willem is here for  Chief Complaint  Patient presents with   Cough    Cough for over a year    Chronic cough - has had it for > 2 years.  He has asbestosis and pulm fibrosis.the cough is intermittent and he can bring up clear thick sputum  - no other symptoms.    Started on trelegy just over on month ago.  This did not help.  Has seen pulmonary and ENT.  ENT eval in 2022 was normal.  Saw pulmonary 03/2022 - Dr Vaughan Browner thought his cough was likely secondary to pulm fibrosis which has gotten worse.  Increased cough - ? Related to bronchitis - started on zpak and prednisone.  He does not remember if this helped or not.  Has been on inhalers, antihistamines, PPIs, steroid, cough suppressants, antibiotics, gabapentin, codeine cough syrup.    He is coughing up clear mucus - it is thick and clear.  If he stops coughing up mucus he feels that the cough will stop.    We will also follow-up on his chronic medical problems since he is here.  He is taking his medication as prescribed.  His appetite is still decreased and he is not eating much.  Medications and allergies reviewed with patient and updated if appropriate.  Current Outpatient Medications on File Prior to Visit  Medication Sig Dispense Refill   albuterol (VENTOLIN HFA) 108 (90 Base) MCG/ACT inhaler Inhale 2 puffs into the lungs every 6 (six) hours as needed for wheezing or shortness of breath. 8 g 6   cholecalciferol (VITAMIN D3) 25 MCG (1000 UNIT) tablet Take 1,000 Units by mouth daily.     famotidine (PEPCID) 40 MG tablet Take 1 tablet (40 mg total) by mouth daily. 90 tablet 1   fluticasone (FLONASE) 50 MCG/ACT nasal spray Place 2 sprays into both nostrils daily. 16 g 6   Fluticasone-Umeclidin-Vilant (TRELEGY ELLIPTA) 100-62.5-25 MCG/ACT AEPB Inhale 1 puff into the lungs daily. 1 each 11   gabapentin (NEURONTIN) 100 MG capsule  Take 1 tab in morning, take 3 tabs at HS 120 capsule 5   glipiZIDE (GLUCOTROL) 5 MG tablet Take 2 pills with breakfast and 1 pill with dinner 270 tablet 1   HYDROcodone bit-homatropine (HYCODAN) 5-1.5 MG/5ML syrup Take 5 mLs by mouth every 6 (six) hours as needed for cough. 240 mL 0   levocetirizine (XYZAL) 5 MG tablet TAKE 1 TABLET BY MOUTH EVERY EVENING. 30 tablet 5   metFORMIN (GLUCOPHAGE-XR) 500 MG 24 hr tablet TAKE 2 TABLETS BY MOUTH TWICE A DAY 120 tablet 1   metoprolol tartrate (LOPRESSOR) 50 MG tablet Take 1 tablet (50 mg total) by mouth 2 (two) times daily. 60 tablet 11   nitroGLYCERIN (NITROSTAT) 0.4 MG SL tablet Place 1 tablet (0.4 mg total) under the tongue every 5 (five) minutes as needed for chest pain. 100 tablet 3   Omega-3 Fatty Acids (FISH OIL PO) Take 1 tablet by mouth daily.     omeprazole (PRILOSEC) 40 MG capsule TAKE 1 CAPSULE BY MOUTH 2 TIMES DAILY. 120 capsule 5   ONETOUCH DELICA LANCETS 26R MISC TEST BLOOD SUGAR ONCE DAILY 100 each 3   ONETOUCH VERIO test strip USE UP TO 4 TIMES A DAY AS DIRECTED 100 strip 0   simvastatin (ZOCOR) 40 MG tablet Take 1 tablet (  40 mg total) by mouth every evening. 90 tablet 2   XARELTO 20 MG TABS tablet TAKE 1 TABLET BY MOUTH DAILY WITH SUPPER 30 tablet 5   Current Facility-Administered Medications on File Prior to Visit  Medication Dose Route Frequency Provider Last Rate Last Admin   0.9 %  sodium chloride infusion  500 mL Intravenous Once Gatha Mayer, MD        Review of Systems  Constitutional:  Negative for fever.  HENT:  Positive for rhinorrhea and voice change. Negative for sore throat.   Respiratory:  Positive for cough (thick clear mucus), shortness of breath and wheezing (sometimes).   Cardiovascular:  Positive for leg swelling. Negative for chest pain and palpitations.  Gastrointestinal:        No gerd  Neurological:  Negative for light-headedness and headaches.       Objective:   Vitals:   06/23/22 0808  BP:  130/72  Pulse: 90  Temp: 98.3 F (36.8 C)  SpO2: 93%   BP Readings from Last 3 Encounters:  06/23/22 130/72  05/18/22 116/68  04/19/22 (!) 144/76   Wt Readings from Last 3 Encounters:  06/23/22 174 lb (78.9 kg)  05/18/22 175 lb 8 oz (79.6 kg)  04/19/22 175 lb (79.4 kg)   Body mass index is 26.46 kg/m.    Physical Exam Constitutional:      General: He is not in acute distress.    Appearance: Normal appearance. He is not ill-appearing.  HENT:     Head: Normocephalic and atraumatic.  Eyes:     Conjunctiva/sclera: Conjunctivae normal.  Cardiovascular:     Rate and Rhythm: Normal rate and regular rhythm.     Heart sounds: Normal heart sounds. No murmur heard. Pulmonary:     Effort: Pulmonary effort is normal. No respiratory distress.     Breath sounds: Normal breath sounds. No wheezing or rales.  Musculoskeletal:     Right lower leg: No edema.     Left lower leg: No edema.  Skin:    General: Skin is warm and dry.     Findings: No rash.  Neurological:     Mental Status: He is alert. Mental status is at baseline.  Psychiatric:        Mood and Affect: Mood normal.            Assessment & Plan:    See Problem List for Assessment and Plan of chronic medical problems.

## 2022-06-23 ENCOUNTER — Ambulatory Visit (INDEPENDENT_AMBULATORY_CARE_PROVIDER_SITE_OTHER): Payer: HMO | Admitting: Internal Medicine

## 2022-06-23 VITALS — BP 130/72 | HR 90 | Temp 98.3°F | Ht 68.0 in | Wt 174.0 lb

## 2022-06-23 DIAGNOSIS — E1151 Type 2 diabetes mellitus with diabetic peripheral angiopathy without gangrene: Secondary | ICD-10-CM | POA: Diagnosis not present

## 2022-06-23 DIAGNOSIS — I4891 Unspecified atrial fibrillation: Secondary | ICD-10-CM | POA: Diagnosis not present

## 2022-06-23 DIAGNOSIS — I10 Essential (primary) hypertension: Secondary | ICD-10-CM

## 2022-06-23 DIAGNOSIS — R053 Chronic cough: Secondary | ICD-10-CM

## 2022-06-23 DIAGNOSIS — E7849 Other hyperlipidemia: Secondary | ICD-10-CM

## 2022-06-23 DIAGNOSIS — D649 Anemia, unspecified: Secondary | ICD-10-CM

## 2022-06-23 LAB — CBC WITH DIFFERENTIAL/PLATELET
Basophils Absolute: 0 10*3/uL (ref 0.0–0.1)
Basophils Relative: 0.4 % (ref 0.0–3.0)
Eosinophils Absolute: 0.1 10*3/uL (ref 0.0–0.7)
Eosinophils Relative: 1.5 % (ref 0.0–5.0)
HCT: 37.8 % — ABNORMAL LOW (ref 39.0–52.0)
Hemoglobin: 12.4 g/dL — ABNORMAL LOW (ref 13.0–17.0)
Lymphocytes Relative: 29.4 % (ref 12.0–46.0)
Lymphs Abs: 2.2 10*3/uL (ref 0.7–4.0)
MCHC: 33 g/dL (ref 30.0–36.0)
MCV: 101.7 fl — ABNORMAL HIGH (ref 78.0–100.0)
Monocytes Absolute: 0.7 10*3/uL (ref 0.1–1.0)
Monocytes Relative: 9 % (ref 3.0–12.0)
Neutro Abs: 4.4 10*3/uL (ref 1.4–7.7)
Neutrophils Relative %: 59.7 % (ref 43.0–77.0)
Platelets: 177 10*3/uL (ref 150.0–400.0)
RBC: 3.71 Mil/uL — ABNORMAL LOW (ref 4.22–5.81)
RDW: 16.5 % — ABNORMAL HIGH (ref 11.5–15.5)
WBC: 7.3 10*3/uL (ref 4.0–10.5)

## 2022-06-23 LAB — COMPREHENSIVE METABOLIC PANEL
ALT: 11 U/L (ref 0–53)
AST: 20 U/L (ref 0–37)
Albumin: 4.1 g/dL (ref 3.5–5.2)
Alkaline Phosphatase: 65 U/L (ref 39–117)
BUN: 22 mg/dL (ref 6–23)
CO2: 27 mEq/L (ref 19–32)
Calcium: 9.8 mg/dL (ref 8.4–10.5)
Chloride: 105 mEq/L (ref 96–112)
Creatinine, Ser: 0.99 mg/dL (ref 0.40–1.50)
GFR: 72.51 mL/min (ref >60.00)
Glucose, Bld: 114 mg/dL — ABNORMAL HIGH (ref 70–99)
Potassium: 4.4 mEq/L (ref 3.5–5.1)
Sodium: 140 mEq/L (ref 135–145)
Total Bilirubin: 0.7 mg/dL (ref 0.2–1.2)
Total Protein: 7.8 g/dL (ref 6.0–8.3)

## 2022-06-23 LAB — LIPID PANEL
Cholesterol: 125 mg/dL (ref 0–200)
HDL: 35.5 mg/dL — ABNORMAL LOW (ref >39.00)
LDL Cholesterol: 63 mg/dL (ref 0–99)
NonHDL: 89.63
Total CHOL/HDL Ratio: 4
Triglycerides: 133 mg/dL (ref 0.0–149.0)
VLDL: 26.6 mg/dL (ref 0.0–40.0)

## 2022-06-23 LAB — FOLATE: Folate: >23.8 ng/mL (ref >5.9)

## 2022-06-23 LAB — HEMOGLOBIN A1C: Hgb A1c MFr Bld: 8 % — ABNORMAL HIGH (ref 4.6–6.5)

## 2022-06-23 MED ORDER — MONTELUKAST SODIUM 10 MG PO TABS: 10.0000 mg | ORAL_TABLET | Freq: Every day | ORAL | 3 refills | Status: DC

## 2022-06-23 NOTE — Assessment & Plan Note (Signed)
Chronic Blood pressure well controlled CMP Continue metoprolol 50 mg twice daily

## 2022-06-23 NOTE — Assessment & Plan Note (Signed)
Chronic Following with cardiology Onset alto and metoprolol CBC, CMP

## 2022-06-23 NOTE — Assessment & Plan Note (Signed)
Chronic Check lipid panel  Continue simvastatin 40 mg daily Regular exercise and healthy diet encouraged  

## 2022-06-23 NOTE — Assessment & Plan Note (Signed)
Chronic Dr Vaughan Browner did mention his cough was likely related to his pulm fibrosis Has mucus in his throat which he feels is causing the cough Has tried multiple things in the past Trial of montelukast 10 mg HS Start mucinex twice daily Stressed drinking water throughout the day Info given regarding chronic cough related to pulm fibrosis

## 2022-06-23 NOTE — Patient Instructions (Addendum)
      Blood work was ordered.   The lab is on the first floor.    Medications changes include :   montelukast at bedtime. Try mucinex daily. Increase water intake.       Return in about 6 months (around 12/22/2022) for Physical Exam, cancel dec appt.

## 2022-06-23 NOTE — Assessment & Plan Note (Signed)
Chronic   Lab Results  Component Value Date   HGBA1C 8.0 (H) 06/23/2022   Sugars not controlled Check A1c Continue metformin 1000 mg twice daily, glipizide 10 mg with breakfast, 1 pill with dinner Stressed regular exercise, diabetic diet

## 2022-06-26 ENCOUNTER — Encounter: Payer: Self-pay | Admitting: Internal Medicine

## 2022-06-27 MED ORDER — METFORMIN HCL ER 500 MG PO TB24: ORAL_TABLET | ORAL | 5 refills | Status: DC

## 2022-06-29 ENCOUNTER — Other Ambulatory Visit: Payer: Self-pay | Admitting: Internal Medicine

## 2022-07-07 ENCOUNTER — Telehealth: Payer: Self-pay | Admitting: Internal Medicine

## 2022-07-07 NOTE — Telephone Encounter (Signed)
[facility], [phone], Doroteo Bradford, care manager, called:  REASON: to inform PCP of GAPS in medications:  METFORMIN  ZOCOR METOPROLOL  Please follow-up as per guideline requirements.

## 2022-07-10 ENCOUNTER — Other Ambulatory Visit: Payer: Self-pay | Admitting: Internal Medicine

## 2022-07-20 ENCOUNTER — Ambulatory Visit
Admission: RE | Admit: 2022-07-20 | Discharge: 2022-07-20 | Disposition: A | Discharge status: Home / Self Care | Payer: HMO | Source: Ambulatory Visit | Attending: Pulmonary Disease | Admitting: Pulmonary Disease

## 2022-07-20 DIAGNOSIS — J849 Interstitial pulmonary disease, unspecified: Secondary | ICD-10-CM

## 2022-07-24 ENCOUNTER — Ambulatory Visit: Payer: HMO

## 2022-07-24 ENCOUNTER — Ambulatory Visit (INDEPENDENT_AMBULATORY_CARE_PROVIDER_SITE_OTHER): Payer: HMO | Admitting: Pulmonary Disease

## 2022-07-24 ENCOUNTER — Encounter: Payer: Self-pay | Admitting: Pulmonary Disease

## 2022-07-24 VITALS — BP 136/68 | HR 85 | Temp 98.4°F | Ht 68.0 in | Wt 176.0 lb

## 2022-07-24 DIAGNOSIS — J849 Interstitial pulmonary disease, unspecified: Secondary | ICD-10-CM | POA: Diagnosis not present

## 2022-07-24 DIAGNOSIS — Z5181 Encounter for therapeutic drug level monitoring: Secondary | ICD-10-CM

## 2022-07-24 MED ORDER — PREDNISONE 10 MG PO TABS: 10.0000 mg | ORAL_TABLET | Freq: Every day | ORAL | 2 refills | Status: DC

## 2022-07-24 NOTE — Progress Notes (Unsigned)
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 with history of allergies, rhinitis, atrial fibrillation, hypertension, hyperlipidemia, diabetes, GERD Follow-up for asbestosis, chronic cough, COVID-19 infection in September 2021  Developed COVID-19 in September 2021.  He was sick for about a week.  Treated with outpatient monoclonal antibody therapy and did not require hospitalization His cough has worsened since his COVID-19 infection.  He was put on prednisone for a month with slow taper with improvement in symptoms Continues to have persistent cough.  ENT examination in 2022 was normal  Started Esbriet in January 2023 for progressive pulmonary fibrosis.  But had to stop in 3 months after he developed significant sunburn.  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   His chief complaint is cough which has been resistant to treatment Reports increasing chest congestion and yellow/green mucus.  Denies any fevers or chills  Outpatient Encounter Medications as of 07/24/2022  Medication Sig   albuterol (VENTOLIN HFA) 108 (90 Base) MCG/ACT inhaler Inhale 2 puffs into the lungs every 6 (six) hours as needed for wheezing or shortness of breath.   cholecalciferol (VITAMIN D3) 25 MCG (1000 UNIT) tablet Take 1,000 Units by mouth daily.   famotidine (PEPCID) 40 MG tablet Take 1 tablet (40 mg total) by mouth daily.   fluticasone (FLONASE) 50 MCG/ACT nasal spray Place 2 sprays into both nostrils daily.   gabapentin (NEURONTIN) 100 MG  capsule Take 1 tab in morning, take 3 tabs at HS   glipiZIDE (GLUCOTROL) 5 MG tablet TAKE 2 TABLETS BY MOUTH WITH BREAKFAST AND 1 TABLET WITH DINNER.   HYDROcodone bit-homatropine (HYCODAN) 5-1.5 MG/5ML syrup Take 5 mLs by mouth every 6 (six) hours as needed for cough.   levocetirizine (XYZAL) 5 MG tablet TAKE 1 TABLET BY MOUTH EVERY EVENING.   metFORMIN (GLUCOPHAGE-XR) 500 MG 24 hr tablet 2 tabs with breakfast, 1 tab with lunch, 2 tabs with dinner   metoprolol tartrate (LOPRESSOR) 50 MG tablet Take 1 tablet (50 mg total) by mouth 2 (two) times daily.   montelukast (SINGULAIR) 10 MG tablet Take 1 tablet (10 mg total) by mouth at bedtime.   nitroGLYCERIN (NITROSTAT) 0.4 MG SL tablet Place 1 tablet (0.4 mg total) under the tongue every 5 (five) minutes as needed for chest pain.   Omega-3 Fatty Acids (FISH OIL PO) Take 1 tablet by mouth daily.   omeprazole (PRILOSEC) 40 MG capsule TAKE 1 CAPSULE BY MOUTH 2 TIMES DAILY.   ONETOUCH DELICA LANCETS 67Y MISC TEST BLOOD SUGAR ONCE DAILY   ONETOUCH VERIO test strip USE UP TO 4 TIMES A DAY AS DIRECTED   simvastatin (ZOCOR) 40 MG tablet Take 1 tablet (40 mg total) by mouth every evening.   XARELTO 20 MG TABS tablet TAKE 1 TABLET BY MOUTH DAILY WITH SUPPER   Fluticasone-Umeclidin-Vilant (TRELEGY ELLIPTA) 100-62.5-25 MCG/ACT AEPB Inhale 1 puff into the lungs daily. (Patient not taking: Reported on 07/24/2022)   No facility-administered encounter medications on file as of 07/24/2022.   Physical Exam: Blood  pressure (!) 144/76, pulse 83, temperature 98.4 F (36.9 C), temperature source Oral, height '5\' 8"'$  (1.727 m), weight 175 lb (79.4 kg), SpO2 98 %. Gen:      No acute distress HEENT:  EOMI, sclera anicteric Neck:     No masses; no thyromegaly Lungs:    Clear to auscultation bilaterally; normal respiratory effort CV:         Regular rate and rhythm; no murmurs Abd:      + bowel sounds; soft, non-tender; no palpable masses, no distension Ext:    No edema;  adequate peripheral perfusion Skin:      Warm and dry; no rash Neuro: alert and oriented x 3 Psych: normal mood and affect   Data Reviewed: Imaging Chest x-ray 12/04/16-stable interstitial opacities CT abdomen 11/20/13-mild reticular opacities at the bases CT high-resolution 01/02/17- subpleural reticulation, mild traction bronchiectasis, possible early honeycombing with basal gradient. 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  Slightly worse compared to 2018 High-res CT 03/21/2019-probable UIP High-res CT 03/30/2020-unchanged probable UIP fibrosis High-res CT 09/02/2020- unchanged probable UIP fibrosis High-res CT 06/30/2021-slight worsening of fibrosis and probable UIP pattern I have reviewed the images personally  PFTs  03/20/17- FVC 2.74 (72%], FEV1 2.38 (87%), F/F 87, TLC 62%, DLCO 15.89 (56%)  01/15/2018-FVC 2.74 [73%], FEV1 2.33 [86%], F/F 85, TLC 71%, DLCO 13.13 (46%)  07/23/2018-FVC 2.51 [69%), FEV1 2.10 [78%), DLCO 15.16 (53%) Mild restriction, moderate-severe diffusion defect.  04/02/2019 FVC 2.70 [72%], FEV1 2.18 [82%], F/F 81, DLCO 13.8 [60%] Moderate diffusion defect  08/31/2020 FVC 2.52 [65%], FEV1 2.25 [82%], F/F 89, TLC 4.64 [69%], DLCO 20.59 [88%] Mild restriction.  Improvement in diffusion capacity  08/24/2021 FVC 2.03 [53%], FEV1 1.53 [56%], F/F 75, TLC 2.78 [41%], DLCO 13.45 [58%] Severe restriction, moderate diffusion defect  FENO 12/18/16- 12  Labs ILD serologies 01/09/17- all negative except for mild elevation in SCL 75.  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  PFTs today show significant worsening of restriction and diffusion impairment.  Started Esbriet in January 2023 but we had to stop it as  he developed significant sunburn LFTs are normal  We discussed alternate medications but he does not want to try the Ofev right now as he already has diarrhea Order high-res CT and PFTs in 3 months for reevaluation  Chronic cough Likely secondary to pulmonary fibrosis He has been treated adequately for GERD and tried on antihistamines which did not help ENT examination in 2022 is unremarkable He has some improvement with codeine cough syrup.   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  He had mild symptoms which did not require hospitalization  Health maintenance 01/15/2015-Prevnar 13 05/05/2010-Pneumovax  Plan/Recommendations:   Todd Garfinkel MD [facility] 07/24/2022, 3:40 PM  CC: Binnie Rail, MD

## 2022-07-24 NOTE — Patient Instructions (Signed)
Will start paperwork for a medication called Ofev 100 mg twice daily Prednisone 10 mg a day for cough Follow-up in 3 months

## 2022-07-25 ENCOUNTER — Other Ambulatory Visit: Payer: Self-pay | Admitting: Internal Medicine

## 2022-07-25 ENCOUNTER — Telehealth: Payer: Self-pay | Admitting: Pharmacist

## 2022-07-25 DIAGNOSIS — E1151 Type 2 diabetes mellitus with diabetic peripheral angiopathy without gangrene: Secondary | ICD-10-CM

## 2022-07-25 MED ORDER — METFORMIN HCL ER 500 MG PO TB24: ORAL_TABLET | ORAL | 2 refills | Status: DC

## 2022-07-25 MED ORDER — FREESTYLE LIBRE 14 DAY SENSOR MISC: 5 refills | Status: DC

## 2022-07-25 MED ORDER — FREESTYLE LIBRE 14 DAY READER DEVI: 0 refills | Status: DC

## 2022-07-25 NOTE — Telephone Encounter (Signed)
Received new start paperwork for Ofev '150mg'$  caps  Submitted a Prior Authorization request to  [employer] for HTA  for OFEV via CoverMyMeds. Will update once we receive a response.  Key: Carlynn Spry Cares paperwork placed in PAP pending info folder in pharmacy office pending PA determination  Knox Saliva, PharmD, MPH, BCPS, CPP Clinical Pharmacist (Rheumatology and Pulmonology)

## 2022-07-27 ENCOUNTER — Other Ambulatory Visit (HOSPITAL_COMMUNITY): Payer: Self-pay

## 2022-07-27 NOTE — Telephone Encounter (Signed)
Received notification from  [employer]  regarding a prior authorization for [place]. Authorization has been APPROVED from 07/25/2022 to 07/26/2023. Approval letter sent to scan center.  Per test claim, copay for 30 days supply is $1597.93  Patient can fill through [place]: [phone]   Authorization # [id number]  Spoke with patient's wife regarding income document requirement. She will collect for both her and pt and have their daughter help make copies. She plans to mail to our clinic. She states she will try to send everything by early next week.  Knox Saliva, PharmD, MPH, BCPS, CPP Clinical Pharmacist (Rheumatology and Pulmonology)

## 2022-08-03 ENCOUNTER — Ambulatory Visit (INDEPENDENT_AMBULATORY_CARE_PROVIDER_SITE_OTHER): Payer: HMO | Admitting: *Deleted

## 2022-08-03 DIAGNOSIS — Z0000 Encounter for general adult medical examination without abnormal findings: Secondary | ICD-10-CM | POA: Diagnosis not present

## 2022-08-03 NOTE — Patient Instructions (Signed)
Health Maintenance, Male Adopting a healthy lifestyle and getting preventive care are important in promoting health and wellness. Ask your health care provider about: The right schedule for you to have regular tests and exams. Things you can do on your own to prevent diseases and keep yourself healthy. What should I know about diet, weight, and exercise? Eat a healthy diet  Eat a diet that includes plenty of vegetables, fruits, low-fat dairy products, and lean protein. Do not eat a lot of foods that are high in solid fats, added sugars, or sodium. Maintain a healthy weight Body mass index (BMI) is a measurement that can be used to identify possible weight problems. It estimates body fat based on height and weight. Your health care provider can help determine your BMI and help you achieve or maintain a healthy weight. Get regular exercise Get regular exercise. This is one of the most important things you can do for your health. Most adults should: Exercise for at least 150 minutes each week. The exercise should increase your heart rate and make you sweat (moderate-intensity exercise). Do strengthening exercises at least twice a week. This is in addition to the moderate-intensity exercise. Spend less time sitting. Even light physical activity can be beneficial. Watch cholesterol and blood lipids Have your blood tested for lipids and cholesterol at 79 years of age, then have this test every 5 years. You may need to have your cholesterol levels checked more often if: Your lipid or cholesterol levels are high. You are older than 79 years of age. You are at high risk for heart disease. What should I know about cancer screening? Many types of cancers can be detected early and may often be prevented. Depending on your health history and family history, you may need to have cancer screening at various ages. This may include screening for: Colorectal cancer. Prostate cancer. Skin cancer. Lung  cancer. What should I know about heart disease, diabetes, and high blood pressure? Blood pressure and heart disease High blood pressure causes heart disease and increases the risk of stroke. This is more likely to develop in people who have high blood pressure readings or are overweight. Talk with your health care provider about your target blood pressure readings. Have your blood pressure checked: Every 3-5 years if you are 18-39 years of age. Every year if you are 40 years old or older. If you are between the ages of 65 and 75 and are a current or former smoker, ask your health care provider if you should have a one-time screening for abdominal aortic aneurysm (AAA). Diabetes Have regular diabetes screenings. This checks your fasting blood sugar level. Have the screening done: Once every three years after age 45 if you are at a normal weight and have a low risk for diabetes. More often and at a younger age if you are overweight or have a high risk for diabetes. What should I know about preventing infection? Hepatitis B If you have a higher risk for hepatitis B, you should be screened for this virus. Talk with your health care provider to find out if you are at risk for hepatitis B infection. Hepatitis C Blood testing is recommended for: Everyone born from 1945 through 1965. Anyone with known risk factors for hepatitis C. Sexually transmitted infections (STIs) You should be screened each year for STIs, including gonorrhea and chlamydia, if: You are sexually active and are younger than 79 years of age. You are older than 79 years of age and your   health care provider tells you that you are at risk for this type of infection. Your sexual activity has changed since you were last screened, and you are at increased risk for chlamydia or gonorrhea. Ask your health care provider if you are at risk. Ask your health care provider about whether you are at high risk for HIV. Your health care provider  may recommend a prescription medicine to help prevent HIV infection. If you choose to take medicine to prevent HIV, you should first get tested for HIV. You should then be tested every 3 months for as long as you are taking the medicine. Follow these instructions at home: Alcohol use Do not drink alcohol if your health care provider tells you not to drink. If you drink alcohol: Limit how much you have to 0-2 drinks a day. Know how much alcohol is in your drink. In the [place] one drink equals one 12 oz bottle of beer (355 mL), one 5 oz glass of Naomia Lenderman (148 mL), or one 1 oz glass of hard liquor (44 mL). Lifestyle Do not use any products that contain nicotine or tobacco. These products include cigarettes, chewing tobacco, and vaping devices, such as e-cigarettes. If you need help quitting, ask your health care provider. Do not use street drugs. Do not share needles. Ask your health care provider for help if you need support or information about quitting drugs. General instructions Schedule regular health, dental, and eye exams. Stay current with your vaccines. Tell your health care provider if: You often feel depressed. You have ever been abused or do not feel safe at home. Summary Adopting a healthy lifestyle and getting preventive care are important in promoting health and wellness. Follow your health care provider's instructions about healthy diet, exercising, and getting tested or screened for diseases. Follow your health care provider's instructions on monitoring your cholesterol and blood pressure. This information is not intended to replace advice given to you by your health care provider. Make sure you discuss any questions you have with your health care provider. Document Revised: 12/27/2020 Document Reviewed: 12/27/2020 Elsevier Patient Education  2023 [employer]  

## 2022-08-03 NOTE — Progress Notes (Signed)
Subjective:   Todd [name] is a 81 y.o. male who presents for Medicare Annual/Subsequent preventive examination. I connected with  Todd [name] on 08/03/22 by a audio enabled telemedicine application and verified that I am speaking with the correct person using two identifiers.  Patient Location: Home  Provider Location: Home Office  I discussed the limitations of evaluation and management by telemedicine. The patient expressed understanding and agreed to proceed.  Review of Systems    Deferred to PCP Cardiac Risk Factors include: advanced age (>57mn, >>75women);diabetes mellitus;dyslipidemia;male gender;hypertension     Objective:    There were no vitals filed for this visit. There is no height or weight on file to calculate BMI.     08/03/2022   10:19 AM 08/02/2021   10:50 AM 06/08/2020   10:51 AM 06/07/2020   12:48 PM 12/23/2018    1:50 PM 10/31/2018    4:02 PM 10/21/2018   12:26 PM  Advanced Directives  Does Patient Have a Medical Advance Directive? _0  Yes Yes  Type of AParamedicof ALake LillianLiving will HRangerLiving will HWhitesideLiving will HPrestburyLiving will HSchlaterLiving will HVermillionLiving will Living will  Does patient want to make changes to medical advance directive? No - Patient declined No - Patient declined No - Patient declined No - Patient declined No - Patient declined    Copy of H[place]aukeein Chart? No - copy requested No - copy requested No - copy requested   No - copy requested     Current Medications (verified) Outpatient Encounter Medications as of 08/03/2022  Medication Sig   albuterol (VENTOLIN HFA) 108 (90 Base) MCG/ACT inhaler Inhale 2 puffs into the lungs every 6 (six) hours as needed for wheezing or shortness of breath.   cholecalciferol (VITAMIN D3) 25 MCG (1000 UNIT) tablet Take  1,000 Units by mouth daily.   Continuous Blood Gluc Receiver (FREESTYLE LIBRE 14 DAY READER) DEVI UAD to check sugars.  E11.9   Continuous Blood Gluc Sensor (FREESTYLE LIBRE 14 DAY SENSOR) MISC UAD to check sugars.  E11.9   famotidine (PEPCID) 40 MG tablet Take 1 tablet (40 mg total) by mouth daily.   fluticasone (FLONASE) 50 MCG/ACT nasal spray Place 2 sprays into both nostrils daily.   gabapentin (NEURONTIN) 100 MG capsule Take 1 tab in morning, take 3 tabs at HS   glipiZIDE (GLUCOTROL) 5 MG tablet TAKE 2 TABLETS BY MOUTH WITH BREAKFAST AND 1 TABLET WITH DINNER.   HYDROcodone bit-homatropine (HYCODAN) 5-1.5 MG/5ML syrup Take 5 mLs by mouth every 6 (six) hours as needed for cough.   levocetirizine (XYZAL) 5 MG tablet TAKE 1 TABLET BY MOUTH EVERY EVENING.   metFORMIN (GLUCOPHAGE-XR) 500 MG 24 hr tablet 2 tabs with breakfast, 1 tab with lunch, 2 tabs with dinner   metoprolol tartrate (LOPRESSOR) 50 MG tablet Take 1 tablet (50 mg total) by mouth 2 (two) times daily.   montelukast (SINGULAIR) 10 MG tablet Take 1 tablet (10 mg total) by mouth at bedtime.   nitroGLYCERIN (NITROSTAT) 0.4 MG SL tablet Place 1 tablet (0.4 mg total) under the tongue every 5 (five) minutes as needed for chest pain.   Omega-3 Fatty Acids (FISH OIL PO) Take 1 tablet by mouth daily.   omeprazole (PRILOSEC) 40 MG capsule TAKE 1 CAPSULE BY MOUTH 2 TIMES DAILY.   ONETOUCH DELICA LANCETS 338HMISC TEST BLOOD  SUGAR ONCE DAILY   ONETOUCH VERIO test strip USE UP TO 4 TIMES A DAY AS DIRECTED   predniSONE (DELTASONE) 10 MG tablet Take 1 tablet (10 mg total) by mouth daily with breakfast.   simvastatin (ZOCOR) 40 MG tablet Take 1 tablet (40 mg total) by mouth every evening.   XARELTO 20 MG TABS tablet TAKE 1 TABLET BY MOUTH DAILY WITH SUPPER   Fluticasone-Umeclidin-Vilant (TRELEGY ELLIPTA) 100-62.5-25 MCG/ACT AEPB Inhale 1 puff into the lungs daily. (Patient not taking: Reported on 07/24/2022)   No facility-administered encounter  medications on file as of 08/03/2022.    Allergies (verified) Amlodipine besylate and Ivp dye [iodinated contrast media]   History: Past Medical History:  Diagnosis Date   Allergic rhinitis    Asthma    Atrial fibrillation with rapid ventricular response ([place])    a. newly diagnosed 11/03/13, spont conv to NSR, placed on xarelto.   CAD (coronary artery disease)    a. 1999; s/p CABG: LIMA to LAD, SVG to OM1, SVG to OM2, SVG to RCA  b. Normal nuc 10/2013 (done because of new onset AF.)   COVID    Diabetes mellitus ([place])    HLD (hyperlipidemia)    HTN (hypertension)    Nephrolithiasis    Overweight(278.02)    PVD (peripheral vascular disease) ([place])    Carotid stenosis   Stroke [facility])    Past Surgical History:  Procedure Laterality Date   CAROTID ENDARTERECTOMY  1999   COLONOSCOPY  2014   negative;Dr Sharlett Iles   COLONOSCOPY W/ POLYPECTOMY  2009   Dr Sharlett Iles   CORONARY ARTERY BYPASS GRAFT  Aug.1999   HEMORRHOID BANDING     INTRACAPSULAR CATARACT EXTRACTION Bilateral 2018   NASAL SINUS SURGERY     Family History  Problem Relation Age of Onset   Heart attack Mother 41   Diabetes Father        IDDM   Stroke Father 50   Asthma Sister    Prostate cancer Brother    Hemochromatosis Brother    Colon cancer Brother 88   Heart attack Brother 100   Heart attack Brother 64   Esophageal cancer Neg Hx    Rectal cancer Neg Hx    Stomach cancer Neg Hx    Social History   Socioeconomic History   Marital status: Married    Spouse name: [name]   Number of children: 2   Years of education: Not on file   Highest education level: Not on file  Occupational History   Occupation: Press photographer Manager-retired  Tobacco Use   Smoking status: Never   Smokeless tobacco: Never  Vaping Use   Vaping Use: Never used  Substance and Sexual Activity   Alcohol use: No    Alcohol/week: 0.0 standard drinks of alcohol   Drug use: No   Sexual activity: Yes  Other Topics Concern   Not on file  Social  History Narrative   Not on file   Social Determinants of Health   Financial Resource Strain: Low Risk  (08/03/2022)   Overall Financial Resource Strain (CARDIA)    Difficulty of Paying Living Expenses: Not hard at all  Food Insecurity: No Food Insecurity (08/03/2022)   Hunger Vital Sign    Worried About Running Out of Food in the Last Year: Never true    [place] in the Last Year: Never true  Transportation Needs: No Transportation Needs (08/03/2022)   PRAPARE - Hydrologist (Medical): No  Lack of Transportation (Non-Medical): No  Physical Activity: Inactive (08/03/2022)   Exercise Vital Sign    Days of Exercise per Week: 0 days    Minutes of Exercise per Session: 0 min  Stress: No Stress Concern Present (08/03/2022)   [place]    Feeling of Stress : Not at all  Social Connections: [place] (08/03/2022)   Social Connection and Isolation Panel [NHANES]    Frequency of Communication with Friends and Family: More than three times a week    Frequency of Social Gatherings with Friends and Family: More than three times a week    Attends Religious Services: More than 4 times per year    Active Member of [employer] or Organizations: No    Attends Music therapist: 1 to 4 times per year    Marital Status: Married    Tobacco Counseling Counseling given: Not Answered   Clinical Intake:  Pre-visit preparation completed: Yes  Pain : No/denies pain     Nutritional Status: BMI 25 -29 Overweight Nutritional Risks: None Diabetes: Yes CBG done?: No Did pt. bring in CBG monitor from home?: No  How often do you need to have someone help you when you read instructions, pamphlets, or other written materials from your doctor or pharmacy?: 1 - Never  Diabetic?Yes Nutrition Risk Assessment:  Has the patient had any N/V/D within the last 2 months?  No  Does the  patient have any non-healing wounds?  No  Has the patient had any unintentional weight loss or weight gain?  No   Diabetes:  Is the patient diabetic?  Yes  If diabetic, was a CBG obtained today?  No  Did the patient bring in their glucometer from home?  No  How often do you monitor your CBG's? CGM.   Financial Strains and Diabetes Management:  Are you having any financial strains with the device, your supplies or your medication? No .  Does the patient want to be seen by Chronic Care Management for management of their diabetes?  No  Would the patient like to be referred to a Nutritionist or for Diabetic Management?  No   Diabetic Exams:  Diabetic Eye Exam: Completed per wife 11/23 Diabetic Foot Exam: Overdue, Pt has been advised about the importance in completing this exam. Pt is scheduled for diabetic foot exam on deferred to PCP.   Interpreter Needed?: No  Information entered by :: Emelia Loron RN   Activities of Daily Living    08/03/2022   10:14 AM  In your present state of health, do you have any difficulty performing the following activities:  Hearing? 1  Comment HOH has hearing aids  Vision? 0  Difficulty concentrating or making decisions? 0  Walking or climbing stairs? 0  Dressing or bathing? 0  Doing errands, shopping? 0  Preparing Food and eating ? N  Using the Toilet? N  In the past six months, have you accidently leaked urine? N  Do you have problems with loss of bowel control? N  Managing your Medications? N  Managing your Finances? N  Housekeeping or managing your Housekeeping? N    Patient Care Team: Binnie Rail, MD as PCP - General (Internal Medicine) Minus Breeding, MD as PCP - Cardiology (Cardiology) Gatha Mayer, MD as Consulting Physician (Gastroenterology) Minus Breeding, MD as Consulting Physician (Cardiology) Vevelyn Royals, MD as Consulting Physician (Ophthalmology) Darleen Crocker, MD as Consulting Physician  (Ophthalmology) Charlton Haws,  [place] as Pharmacist (Pharmacist) Vevelyn Royals, MD as Consulting Physician (Ophthalmology)  Indicate any recent Medical Services you may have received from other than [facility] providers in the past year (date may be approximate).     Assessment:   This is a routine wellness examination for Anden.  Hearing/Vision screen No results found.  Dietary issues and exercise activities discussed: Current Exercise Habits: The patient does not participate in regular exercise at present, Exercise limited by: respiratory conditions(s)   Goals Addressed             This Visit's Progress    Patient Stated       Maintain current health status.      Depression Screen    08/03/2022   10:12 AM 05/18/2022   11:10 AM 02/07/2022    8:33 AM 08/02/2021   11:00 AM 02/09/2021   10:47 AM 06/08/2020   10:48 AM 05/07/2020   10:02 AM  PHQ 2/9 Scores  PHQ - 2 Score 0 0 0 0 0 0 0  PHQ- 9 Score   2        Fall Risk    08/03/2022   10:16 AM 06/23/2022    8:52 AM 05/18/2022   11:10 AM 02/13/2022    3:00 PM 02/07/2022    8:33 AM  [place] in the past year? 0 0 0 0 0  Comment    continues to deny falls x 12 months; does not use assistive devices   Number falls in past yr: 0 0 0 0   Injury with Fall? 0 0 0 0   Risk for fall due to : No Fall Risks No Fall Risks No Fall Risks No Fall Risks   Follow up Falls evaluation completed Falls evaluation completed Falls evaluation completed Falls prevention discussed     FALL RISK PREVENTION PERTAINING TO THE HOME:  Any stairs in or around the home? Yes  If so, are there any without handrails? No  Home free of loose throw rugs in walkways, pet beds, electrical cords, etc? Yes  Adequate lighting in your home to reduce risk of falls? Yes   ASSISTIVE DEVICES UTILIZED TO PREVENT FALLS:  Life alert? No  Use of a cane, walker or w/c? No  Grab bars in the bathroom? No  Shower chair or bench in shower? No  Elevated  toilet seat or a handicapped toilet? No   Cognitive Function:    08/03/2022   10:19 AM 01/15/2015    2:34 PM  MMSE - Mini Mental State Exam  Not completed: Unable to complete Unable to complete        Immunizations Immunization History  Administered Date(s) Administered   Fluad Quad(high Dose 65+) 05/30/2019, 05/06/2021, 05/18/2022   Influenza Split 07/06/2011, 05/15/2012   Influenza Whole 05/12/2010   Influenza, High Dose Seasonal PF 05/30/2017, 05/02/2018, 06/12/2020   Influenza,inj,Quad PF,6+ Mos 05/21/2013   Influenza-Unspecified 05/20/2014, 06/11/2015, 05/24/2016   PFIZER(Purple Top)SARS-COV-2 Vaccination 10/13/2019, 11/03/2019, 10/12/2020   Pneumococcal Conjugate-13 01/15/2015   Pneumococcal Polysaccharide-23 05/22/2005, 05/05/2010   Tdap 12/03/2012   Zoster Recombinat (Shingrix) 12/19/2017, 03/04/2018   Zoster, Live 10/20/2010    TDAP status: Up to date  Flu Vaccine status: Up to date  Pneumococcal vaccine status: Up to date  Covid-19 vaccine status: Information provided on how to obtain vaccines.   Qualifies for Shingles Vaccine? Yes   Zostavax completed No   Shingrix Completed?: Yes  Screening Tests Health Maintenance  Topic Date Due   OPHTHALMOLOGY  EXAM  11/30/2017   COVID-19 Vaccine (4 - 2023-24 season) 04/21/2022   FOOT EXAM  05/23/2022   Hepatitis C Screening  05/07/2069 (Originally 01/27/1959)   DTaP/Tdap/Td (2 - Td or Tdap) 12/04/2022   HEMOGLOBIN A1C  12/22/2022   Diabetic kidney evaluation - Urine ACR  02/08/2023   Diabetic kidney evaluation - eGFR measurement  06/24/2023   Medicare Annual Wellness (AWV)  08/04/2023   Pneumonia Vaccine 44+ Years old  Completed   INFLUENZA VACCINE  Completed   Zoster Vaccines- Shingrix  Completed   HPV VACCINES  Aged Out    Health Maintenance  Health Maintenance Due  Topic Date Due   OPHTHALMOLOGY EXAM  11/30/2017   COVID-19 Vaccine (4 - 2023-24 season) 04/21/2022   FOOT EXAM  05/23/2022    Colorectal  cancer screening: No longer required.   Lung Cancer Screening: (Low Dose CT Chest recommended if Age 75-80 years, 30 pack-year currently smoking OR have quit w/in 15years.) does not qualify.   Additional Screening:  Hepatitis C Screening: does qualify; Completed education provided  Vision Screening: Recommended annual ophthalmology exams for early detection of glaucoma and other disorders of the eye. Is the patient up to date with their annual eye exam?  Yes  Who is the provider or what is the name of the office in which the patient attends annual eye exams? [facility] If pt is not established with a provider, would they like to be referred to a provider to establish care?  N/A .   Dental Screening: Recommended annual dental exams for proper oral hygiene  Community Resource Referral / Chronic Care Management: CRR required this visit?  No   CCM required this visit?  No      Plan:     I have personally reviewed and noted the following in the patient's chart:   Medical and social history Use of alcohol, tobacco or illicit drugs  Current medications and supplements including opioid prescriptions. Patient is not currently taking opioid prescriptions. Functional ability and status Nutritional status Physical activity Advanced directives List of other physicians Hospitalizations, surgeries, and ER visits in previous 12 months Vitals Screenings to include cognitive, depression, and falls Referrals and appointments  In addition, I have reviewed and discussed with patient certain preventive protocols, quality metrics, and best practice recommendations. A written personalized care plan for preventive services as well as general preventive health recommendations were provided to patient.     Michiel Cowboy, RN   08/03/2022   Nurse Notes:  Mr. Durnin , Thank you for taking time to come for your Medicare Wellness Visit. I appreciate your ongoing commitment to your health goals.  Please review the following plan we discussed and let me know if I can assist you in the future.   These are the goals we discussed:  Goals      Patient Stated     Maintain current health status.     Pharmacy Care Plan     CARE PLAN ENTRY (see longitudinal plan of care for additional care plan information)  Current Barriers:  Chronic Disease Management support, education, and care coordination needs related to Hypertension, Hyperlipidemia, Diabetes, and Atrial Fibrillation   Hypertension / Atrial fibrillation BP Readings from Last 3 Encounters:  05/23/20 131/81  05/07/20 138/80  04/19/20 126/70   Pulse Readings from Last 3 Encounters:  05/23/20 90  05/07/20 81  04/19/20 77  Pharmacist Clinical Goal(s): Over the next 90 days, patient will work with PharmD and providers  to maintain BP goal <130/80 and HR < 110 Current regimen:  Metoprolol tartrate 50 mg BID Xarelto 20 mg daily Interventions: Discussed BP goals and benefits of medications for prevention of heart attack / stroke Patient self care activities - Over the next 90 days, patient will: Check BP 1-2 times weekly, document, and provide at future appointments Ensure daily salt intake < 2300 mg/day  Hyperlipidemia / CAD Lab Results  Component Value Date/Time   LDLCALC 55 11/05/2019 08:29 AM  Pharmacist Clinical Goal(s): Over the next 90 days, patient will work with PharmD and providers to maintain LDL goal < 70 Current regimen:  Simvastatin 40 mg daily OTC omega-3 fatty acids  Interventions: Discussed cholesterol goals and benefits of medications for prevention of heart attack / stroke Patient self care activities - Over the next 90 days, patient will: Continue current medications  Diabetes Lab Results  Component Value Date/Time   HGBA1C 8.7 (H) 05/07/2020 10:22 AM   HGBA1C 8.3 (H) 11/05/2019 08:29 AM  Pharmacist Clinical Goal(s): Over the next 90 days, patient will work with PharmD and providers to achieve  A1c goal <7% Current regimen:  Metformin ER 500 mg - 2 tab twice a day Glipizide 5 mg twice a day Interventions: Discussed importance of maintaining sugars at goal to prevent complications of diabetes including kidney damage, retinal damage, and cardiovascular disease Recommend to pursue patient assistance for Rybelsus. Once approved, switch glipizide to Rybelsus Patient self care activities - Over the next 30 days, patient will: Check blood sugar in the morning before eating or drinking, document, and provide at future appointments Contact provider with any episodes of hypoglycemia  Medication management Pharmacist Clinical Goal(s): Over the next 90 days, patient will work with PharmD and providers to maintain optimal medication adherence Current pharmacy: [facility] Comprehensive medication review performed. Continue current medication management strategy Patient self care activities - Over the next 90 days, patient will: Focus on medication adherence by pill box Take medications as prescribed Report any questions or concerns to PharmD and/or provider(s)  Initial goal documentation        This is a list of the screening recommended for you and due dates:  Health Maintenance  Topic Date Due   Eye exam for diabetics  11/30/2017   COVID-19 Vaccine (4 - 2023-24 season) 04/21/2022   Complete foot exam   05/23/2022   Hepatitis C Screening: USPSTF Recommendation to screen - Ages 18-79 yo.  05/07/2069*   DTaP/Tdap/Td vaccine (2 - Td or Tdap) 12/04/2022   Hemoglobin A1C  12/22/2022   Yearly kidney health urinalysis for diabetes  02/08/2023   Yearly kidney function blood test for diabetes  06/24/2023   Medicare Annual Wellness Visit  08/04/2023   Pneumonia Vaccine  Completed   Flu Shot  Completed   Zoster (Shingles) Vaccine  Completed   HPV Vaccine  Aged Out  *Topic was postponed. The date shown is not the original due date.

## 2022-08-09 ENCOUNTER — Other Ambulatory Visit (HOSPITAL_COMMUNITY): Payer: Self-pay

## 2022-08-09 NOTE — Telephone Encounter (Signed)
Submitted Patient Assistance Application to [facility] for [place] along with provider portion, PA and income documents. Will update patient when we receive a response.  Fax# [phone] Phone# [phone]  ATC pt and inform him that we have gone ahead and submitted them, but [employer] will likely not accept his income documents due to his printed name being cut off of the Social Security letter. Lvm requesting return call, direct phone number provided.

## 2022-08-10 ENCOUNTER — Ambulatory Visit: Payer: HMO | Admitting: Internal Medicine

## 2022-08-15 NOTE — Telephone Encounter (Signed)
Received a fax from  [name] regarding an approval for Todd [name] patient assistance from 08/10/22 to 08/20/2022. Approval letter sent to scan center.  Phone number: [phone]  ATC patient regarding approval so he can set up shipment this week. Unable to reach. Left detailed VM for patient regarding shipment that needs to be set up before end of year as well as 2024 re-enrollment application that he will need to request. Income documents retained in pharmacy office for 2024 reenrollment if needed  Knox Saliva, PharmD, MPH, BCPS, CPP Clinical Pharmacist (Rheumatology and Pulmonology)

## 2022-08-26 NOTE — Progress Notes (Unsigned)
Cardiology Office Note   Date:  08/28/2022   ID:  Todd [name], DOB 09-18-1940, MRN [id number]  PCP:  Todd Rail, MD  Cardiologist:   Todd Breeding, MD   Chief Complaint  Patient presents with   Cough      History of Present Illness: Todd [name] is a 82 y.o. male who presents for follow up of CAD and atrial fibrillation.  In March of 2015 he had a stress perfusion study demonstrating no evidence of ischemia or infarct with preserved ejection fraction.    Since I last saw him he has had continued cough because of interstitial fibrosis and is being actively managed.  He 2 weeks ago that a prescription for Ofev.  The patient denies any new symptoms such as chest discomfort, neck or arm discomfort. There has been no new shortness of breath, PND or orthopnea. There have been no reported palpitations, presyncope or syncope.    Past Medical History:  Diagnosis Date   Allergic rhinitis    Asthma    Atrial fibrillation with rapid ventricular response (Todd [name])    a. newly diagnosed 11/03/13, spont conv to NSR, placed on xarelto.   CAD (coronary artery disease)    a. 1999; s/p CABG: LIMA to LAD, SVG to OM1, SVG to OM2, SVG to RCA  b. Normal nuc 10/2013 (done because of new onset AF.)   COVID    Diabetes mellitus (Todd [name])    HLD (hyperlipidemia)    HTN (hypertension)    Nephrolithiasis    Overweight(278.02)    PVD (peripheral vascular disease) ([place])    Carotid stenosis   Stroke Todd [name])     Past Surgical History:  Procedure Laterality Date   CAROTID ENDARTERECTOMY  1999   COLONOSCOPY  2014   negative;Dr Todd [name]   COLONOSCOPY W/ POLYPECTOMY  2009   Dr Todd [name]   CORONARY ARTERY BYPASS GRAFT  Aug.1999   HEMORRHOID BANDING     INTRACAPSULAR CATARACT EXTRACTION Bilateral 2018   NASAL SINUS SURGERY       Current Outpatient Medications  Medication Sig Dispense Refill   albuterol (VENTOLIN HFA) 108 (90 Base) MCG/ACT inhaler Inhale 2 puffs into the lungs every 6 (six) hours  as needed for wheezing or shortness of breath. 8 g 6   cholecalciferol (VITAMIN D3) 25 MCG (1000 UNIT) tablet Take 1,000 Units by mouth daily.     Continuous Blood Gluc Receiver (FREESTYLE LIBRE 14 DAY READER) DEVI UAD to check sugars.  E11.9 1 each 0   Continuous Blood Gluc Sensor (FREESTYLE LIBRE 14 DAY SENSOR) MISC UAD to check sugars.  E11.9 2 each 5   famotidine (PEPCID) 40 MG tablet Take 1 tablet (40 mg total) by mouth daily. 90 tablet 1   Fluticasone-Umeclidin-Vilant (TRELEGY ELLIPTA) 100-62.5-25 MCG/ACT AEPB Inhale 1 puff into the lungs daily. 1 each 11   gabapentin (NEURONTIN) 100 MG capsule Take 1 tab in morning, take 3 tabs at HS 120 capsule 5   glipiZIDE (GLUCOTROL) 5 MG tablet TAKE 2 TABLETS BY MOUTH WITH BREAKFAST AND 1 TABLET WITH DINNER. 270 tablet 1   HYDROcodone bit-homatropine (HYCODAN) 5-1.5 MG/5ML syrup Take 5 mLs by mouth every 6 (six) hours as needed for cough. 240 mL 0   levocetirizine (XYZAL) 5 MG tablet TAKE 1 TABLET BY MOUTH EVERY EVENING. 30 tablet 5   metFORMIN (GLUCOPHAGE-XR) 500 MG 24 hr tablet 2 tabs with breakfast, 1 tab with lunch, 2 tabs with dinner 450 tablet 2  nitroGLYCERIN (NITROSTAT) 0.4 MG SL tablet Place 1 tablet (0.4 mg total) under the tongue every 5 (five) minutes as needed for chest pain. 100 tablet 3   Omega-3 Fatty Acids (FISH OIL PO) Take 1 tablet by mouth daily.     omeprazole (PRILOSEC) 40 MG capsule TAKE 1 CAPSULE BY MOUTH 2 TIMES DAILY. 120 capsule 5   ONETOUCH DELICA LANCETS 99I MISC TEST BLOOD SUGAR ONCE DAILY 100 each 3   ONETOUCH VERIO test strip USE UP TO 4 TIMES A DAY AS DIRECTED 100 strip 0   predniSONE (DELTASONE) 10 MG tablet Take 1 tablet (10 mg total) by mouth daily with breakfast. 30 tablet 2   metoprolol tartrate (LOPRESSOR) 50 MG tablet Take 1 tablet (50 mg total) by mouth 2 (two) times daily. 180 tablet 3   rivaroxaban (XARELTO) 20 MG TABS tablet Take 1 tablet (20 mg total) by mouth daily with supper. 90 tablet 3   simvastatin  (ZOCOR) 40 MG tablet Take 1 tablet (40 mg total) by mouth every evening. 90 tablet 2   No current facility-administered medications for this visit.    Allergies:   Amlodipine besylate and Ivp dye [iodinated contrast media]    ROS:  Please see the history of present illness.   Otherwise, review of systems are positive for chronic unrelenting cough.   All other systems are reviewed and negative.    PHYSICAL EXAM: VS:  BP (!) 140/82   Pulse 83   Ht '5\' 8"'$  (1.727 m)   Wt 171 lb 3.2 oz (77.7 kg)   SpO2 92%   BMI 26.03 kg/m  , BMI Body mass index is 26.03 kg/m. GENERAL:  Well appearing NECK:  No jugular venous distention, waveform within normal limits, carotid upstroke brisk and symmetric, no bruits, no thyromegaly LUNGS: Diffuse bilateral fine crackles CHEST:  Unremarkable HEART:  PMI not displaced or sustained,S1 and S2 within normal limits, no S3, no S4, no clicks, no rubs, no murmurs ABD:  Flat, positive bowel sounds normal in frequency in pitch, no bruits, no rebound, no guarding, no midline pulsatile mass, no hepatomegaly, no splenomegaly EXT:  2 plus pulses throughout, no edema, no cyanosis no clubbing   EKG:  EKG is ordered today. The ekg ordered today demonstrates sinus rhythm, rate 83, axis within normal limits, intervals within normal limits, no acute ST-T ,wave changes, PVCs   Recent Labs: 06/23/2022: ALT 11; BUN 22; Creatinine, Ser 0.99; Hemoglobin 12.4; Platelets 177.0; Potassium 4.4; Sodium 140    Lipid Panel    Component Value Date/Time   CHOL 125 06/23/2022 0915   TRIG 133.0 06/23/2022 0915   TRIG 94 07/19/2006 0727   HDL 35.50 (L) 06/23/2022 0915   CHOLHDL 4 06/23/2022 0915   VLDL 26.6 06/23/2022 0915   LDLCALC 63 06/23/2022 0915      Wt Readings from Last 3 Encounters:  08/28/22 171 lb 3.2 oz (77.7 kg)  07/24/22 176 lb (79.8 kg)  06/23/22 174 lb (78.9 kg)      Other studies Reviewed: Additional studies/ records that were reviewed today include:  Labs. Review of the above records demonstrates:  Please see elsewhere in the note.     ASSESSMENT AND PLAN:  PAROXYSMAL ATRIAL FIB - The patient tolerates anticoagulation. [name] has a CHA2DS2 - VASc score of 6.    He tolerates anticoagulation.  Has had no symptomatic paroxysms.  No change in therapy.  CAD -  The patient no new symptoms.  Will continue with  risk reduction.  CAROTID ARTERY DISEASE -  This was mild in 2019.  No change in therapy.  HYPERLIPIDEMIA -  LDL was 63 with an HDL of 35.  No change in therapy.  lost too much weight.   HYPERTENSION - He reports his blood pressure is at target at home.  Continue current meds.  DM A1c is up to 8 from 7.5 but he has been on intermittent steroids for his lung disease.  No change in therapy.   Current medicines are reviewed at length with the patient today.  The patient does not have concerns regarding medicines.  The following changes have been made:  None  Labs/ tests ordered today include:   None  Orders Placed This Encounter  Procedures   EKG 12-Lead     Disposition:   FU with 12 months.     Signed, Todd Breeding, MD  08/28/2022 1:11 PM    [place]

## 2022-08-28 ENCOUNTER — Ambulatory Visit: Payer: HMO | Attending: Cardiology | Admitting: Cardiology

## 2022-08-28 ENCOUNTER — Encounter: Payer: Self-pay | Admitting: Cardiology

## 2022-08-28 VITALS — BP 140/82 | HR 83 | Ht 68.0 in | Wt 171.2 lb

## 2022-08-28 DIAGNOSIS — I4891 Unspecified atrial fibrillation: Secondary | ICD-10-CM

## 2022-08-28 DIAGNOSIS — I480 Paroxysmal atrial fibrillation: Secondary | ICD-10-CM

## 2022-08-28 DIAGNOSIS — E118 Type 2 diabetes mellitus with unspecified complications: Secondary | ICD-10-CM | POA: Diagnosis not present

## 2022-08-28 DIAGNOSIS — E785 Hyperlipidemia, unspecified: Secondary | ICD-10-CM

## 2022-08-28 DIAGNOSIS — I2510 Atherosclerotic heart disease of native coronary artery without angina pectoris: Secondary | ICD-10-CM | POA: Diagnosis not present

## 2022-08-28 DIAGNOSIS — I10 Essential (primary) hypertension: Secondary | ICD-10-CM

## 2022-08-28 MED ORDER — SIMVASTATIN 40 MG PO TABS: 40.0000 mg | ORAL_TABLET | Freq: Every evening | ORAL | 2 refills | Status: DC

## 2022-08-28 MED ORDER — METOPROLOL TARTRATE 50 MG PO TABS: 50.0000 mg | ORAL_TABLET | Freq: Two times a day (BID) | ORAL | 3 refills | Status: DC

## 2022-08-28 MED ORDER — RIVAROXABAN 20 MG PO TABS: 20.0000 mg | ORAL_TABLET | Freq: Every day | ORAL | 3 refills | Status: DC

## 2022-08-28 NOTE — Patient Instructions (Signed)
   Follow-Up: At [facility], you and your health needs are our priority.  As part of our continuing mission to provide you with exceptional heart care, we have created designated Provider Care Teams.  These Care Teams include your primary Cardiologist (physician) and Advanced Practice Providers (APPs -  Physician Assistants and Nurse Practitioners) who all work together to provide you with the care you need, when you need it.  We recommend signing up for the patient portal called "MyChart".  Sign up information is provided on this After Visit Summary.  MyChart is used to connect with patients for Virtual Visits (Telemedicine).  Patients are able to view lab/test results, encounter notes, upcoming appointments, etc.  Non-urgent messages can be sent to your provider as well.   To learn more about what you can do with MyChart, go to [email or web address].    Your next appointment:   12 month(s)  The format for your next appointment:   In Person  Provider:   Adarryl Hochrein, MD     

## 2022-09-12 DIAGNOSIS — H903 Sensorineural hearing loss, bilateral: Secondary | ICD-10-CM | POA: Diagnosis not present

## 2022-09-25 ENCOUNTER — Encounter: Payer: Self-pay | Admitting: Pulmonary Disease

## 2022-09-25 ENCOUNTER — Ambulatory Visit (INDEPENDENT_AMBULATORY_CARE_PROVIDER_SITE_OTHER): Payer: PPO | Admitting: Pulmonary Disease

## 2022-09-25 VITALS — BP 136/68 | HR 86 | Temp 98.2°F | Ht 68.0 in | Wt 169.0 lb

## 2022-09-25 DIAGNOSIS — Z5181 Encounter for therapeutic drug level monitoring: Secondary | ICD-10-CM | POA: Diagnosis not present

## 2022-09-25 DIAGNOSIS — J849 Interstitial pulmonary disease, unspecified: Secondary | ICD-10-CM | POA: Diagnosis not present

## 2022-09-25 LAB — COMPREHENSIVE METABOLIC PANEL
ALT: 50 U/L (ref 0–53)
AST: 39 U/L — ABNORMAL HIGH (ref 0–37)
Albumin: 3.7 g/dL (ref 3.5–5.2)
Alkaline Phosphatase: 153 U/L — ABNORMAL HIGH (ref 39–117)
BUN: 18 mg/dL (ref 6–23)
CO2: 26 mEq/L (ref 19–32)
Calcium: 9.6 mg/dL (ref 8.4–10.5)
Chloride: 101 mEq/L (ref 96–112)
Creatinine, Ser: 1.02 mg/dL (ref 0.40–1.50)
GFR: 69.83 mL/min (ref >60.00)
Glucose, Bld: 233 mg/dL — ABNORMAL HIGH (ref 70–99)
Potassium: 4.7 mEq/L (ref 3.5–5.1)
Sodium: 136 mEq/L (ref 135–145)
Total Bilirubin: 0.9 mg/dL (ref 0.2–1.2)
Total Protein: 7.4 g/dL (ref 6.0–8.3)

## 2022-09-25 MED ORDER — GABAPENTIN 300 MG PO CAPS: 300.0000 mg | ORAL_CAPSULE | Freq: Two times a day (BID) | ORAL | 5 refills | Status: DC

## 2022-09-25 NOTE — Progress Notes (Unsigned)
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 with history of allergies, rhinitis, atrial fibrillation, hypertension, hyperlipidemia, diabetes, GERD Follow-up for asbestosis, chronic cough, COVID-19 infection in September 2021  Developed COVID-19 in September 2021.  He was sick for about a week.  Treated with outpatient monoclonal antibody therapy and did not require hospitalization His cough has worsened since his COVID-19 infection.  He was put on prednisone for a month with slow taper with improvement in symptoms Continues to have persistent cough.  ENT examination in 2022 was normal  Started Esbriet in January 2023 for progressive pulmonary fibrosis.  But had to stop in 3 months after he developed significant sunburn.  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  Started on Ofev in Dec 2023 His chief complaint is cough which has been resistant to treatment Dyspnea on exertion remains stable  Outpatient Encounter Medications as of 09/25/2022  Medication Sig   albuterol (VENTOLIN HFA) 108 (90 Base) MCG/ACT inhaler Inhale 2 puffs into the lungs every 6 (six) hours as needed for wheezing or shortness of breath.   cholecalciferol (VITAMIN D3) 25 MCG (1000 UNIT) tablet Take 1,000 Units by mouth daily.   Continuous Blood Gluc Receiver (FREESTYLE LIBRE 14 DAY READER) DEVI UAD to check sugars.  E11.9   Continuous Blood Gluc Sensor (FREESTYLE LIBRE 14 DAY SENSOR) MISC UAD to check sugars.  E11.9   famotidine (PEPCID) 40 MG tablet  Take 1 tablet (40 mg total) by mouth daily.   gabapentin (NEURONTIN) 100 MG capsule Take 1 tab in morning, take 3 tabs at HS   glipiZIDE (GLUCOTROL) 5 MG tablet TAKE 2 TABLETS BY MOUTH WITH BREAKFAST AND 1 TABLET WITH DINNER.   HYDROcodone bit-homatropine (HYCODAN) 5-1.5 MG/5ML syrup Take 5 mLs by mouth every 6 (six) hours as needed for cough.   levocetirizine (XYZAL) 5 MG tablet TAKE 1 TABLET BY MOUTH EVERY EVENING.   metFORMIN (GLUCOPHAGE-XR) 500 MG 24 hr tablet 2 tabs with breakfast, 1 tab with lunch, 2 tabs with dinner   metoprolol tartrate (LOPRESSOR) 50 MG tablet Take 1 tablet (50 mg total) by mouth 2 (two) times daily.   nitroGLYCERIN (NITROSTAT) 0.4 MG SL tablet Place 1 tablet (0.4 mg total) under the tongue every 5 (five) minutes as needed for chest pain.   Omega-3 Fatty Acids (FISH OIL PO) Take 1 tablet by mouth daily.   omeprazole (PRILOSEC) 40 MG capsule TAKE 1 CAPSULE BY MOUTH 2 TIMES DAILY.   ONETOUCH DELICA LANCETS 40G MISC TEST BLOOD SUGAR ONCE DAILY   ONETOUCH VERIO test strip USE UP TO 4 TIMES A DAY AS DIRECTED   predniSONE (DELTASONE) 10 MG tablet Take 1 tablet (10 mg total) by mouth daily with breakfast.   rivaroxaban (XARELTO) 20 MG TABS tablet Take 1 tablet (20 mg total) by mouth daily with supper.   simvastatin (ZOCOR) 40 MG tablet Take 1 tablet (40 mg total) by mouth every evening.   [DISCONTINUED] Fluticasone-Umeclidin-Vilant (TRELEGY ELLIPTA) 100-62.5-25 MCG/ACT AEPB Inhale 1 puff into the  lungs daily.   No facility-administered encounter medications on file as of 09/25/2022.   Physical Exam: Blood pressure 136/68, pulse 86, temperature 98.2 F (36.8 C), temperature source Oral, height '5\' 8"'$  (1.727 m), weight 169 lb (76.7 kg), SpO2 92 %. Gen:      No acute distress HEENT:  EOMI, sclera anicteric Neck:     No masses; no thyromegaly Lungs:    Clear to auscultation bilaterally; normal respiratory effort CV:         Regular rate and rhythm; no murmurs Abd:      + bowel  sounds; soft, non-tender; no palpable masses, no distension Ext:    No edema; adequate peripheral perfusion Skin:      Warm and dry; no rash Neuro: alert and oriented x 3 Psych: normal mood and affect   Data Reviewed: Imaging Chest x-ray 12/04/16-stable interstitial opacities CT abdomen 11/20/13-mild reticular opacities at the bases CT high-resolution 01/02/17- subpleural reticulation, mild traction bronchiectasis, possible early honeycombing with basal gradient. 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  Slightly worse compared to 2018 High-res CT 03/21/2019-probable UIP High-res CT 03/30/2020-unchanged probable UIP fibrosis High-res CT 09/02/2020- unchanged probable UIP fibrosis High-res CT 06/30/2021-slight worsening of fibrosis and probable UIP pattern I have reviewed the images personally  PFTs  03/20/17- FVC 2.74 (72%], FEV1 2.38 (87%), F/F 87, TLC 62%, DLCO 15.89 (56%)  01/15/2018-FVC 2.74 [73%], FEV1 2.33 [86%], F/F 85, TLC 71%, DLCO 13.13 (46%)  07/23/2018-FVC 2.51 [69%), FEV1 2.10 [78%), DLCO 15.16 (53%) Mild restriction, moderate-severe diffusion defect.  04/02/2019 FVC 2.70 [72%], FEV1 2.18 [82%], F/F 81, DLCO 13.8 [60%] Moderate diffusion defect  08/31/2020 FVC 2.52 [65%], FEV1 2.25 [82%], F/F 89, TLC 4.64 [69%], DLCO 20.59 [88%] Mild restriction.  Improvement in diffusion capacity  08/24/2021 FVC 2.03 [53%], FEV1 1.53 [56%], F/F 75, TLC 2.78 [41%], DLCO 13.45 [58%] Severe restriction, moderate diffusion defect  07/24/2022 Unable to complete due to cough  FENO 12/18/16- 12  Labs ILD serologies 01/09/17- all negative except for mild elevation in SCL 75.  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  PFTs today  show significant worsening of restriction and diffusion impairment.  Follow-up CT from last month reviewed with slight interval progression of fibrosis.  Started Esbriet in January 2023 but we had to stop it as he developed significant sunburn. Now on Ofev after review of CT scan showing interval progression of pulmonary fibrosis we have decided to initiate therapy.  Started at a lower dose of 100 mg twice daily as he already has some diarrhea at baseline.  Chronic cough Likely secondary to pulmonary fibrosis He has been treated adequately for GERD and tried on antihistamines which did not help ENT examination in 2022 is unremarkable He has some improvement with codeine cough syrup.   Also on gabapentin by primary care which has not helped much.  Increase dose of Neurontin to 300 mg twice daily We are also trying low-dose prednisone at 10 mg a day  Post COVID-19 Has made a good recovery.  He had mild symptoms which did not require hospitalization  Health maintenance 01/15/2015-Prevnar 13 05/05/2010-Pneumovax  Plan/Recommendations: Continue Ofev 100 mg twice daily Prednisone 10 mg a day PPI Codeine cough syrup Prednisone 10 mg a day Increase Neurontin to 300 mg twice daily Check comprehensive metabolic  panel for therapeutic monitoring  Marshell Garfinkel MD [facility] 09/25/2022, 10:22 AM  CC: Binnie Rail, MD

## 2022-09-25 NOTE — Patient Instructions (Signed)
Will increase her neurontin to 300 mg twice daily Continue other antiacid medication Continue Ofev at current dose Will check comprehensive metabolic panel today Follow-up in 3 months

## 2022-09-29 ENCOUNTER — Telehealth: Payer: Self-pay | Admitting: Pulmonary Disease

## 2022-09-29 DIAGNOSIS — Z5181 Encounter for therapeutic drug level monitoring: Secondary | ICD-10-CM

## 2022-09-29 NOTE — Telephone Encounter (Signed)
Spoke with patients wife. Went over lab work. Order has been placed for a repeat hepatic panel. Patient is aware. Nothing further needed.

## 2022-10-03 DIAGNOSIS — H16223 Keratoconjunctivitis sicca, not specified as Sjogren's, bilateral: Secondary | ICD-10-CM | POA: Diagnosis not present

## 2022-10-03 DIAGNOSIS — G245 Blepharospasm: Secondary | ICD-10-CM | POA: Diagnosis not present

## 2022-10-03 DIAGNOSIS — Z961 Presence of intraocular lens: Secondary | ICD-10-CM | POA: Diagnosis not present

## 2022-10-03 DIAGNOSIS — H18413 Arcus senilis, bilateral: Secondary | ICD-10-CM | POA: Diagnosis not present

## 2022-10-13 ENCOUNTER — Other Ambulatory Visit (INDEPENDENT_AMBULATORY_CARE_PROVIDER_SITE_OTHER): Payer: PPO

## 2022-10-13 DIAGNOSIS — Z5181 Encounter for therapeutic drug level monitoring: Secondary | ICD-10-CM | POA: Diagnosis not present

## 2022-10-13 LAB — HEPATIC FUNCTION PANEL
ALT: 29 U/L (ref 0–53)
AST: 27 U/L (ref 0–37)
Albumin: 3.5 g/dL (ref 3.5–5.2)
Alkaline Phosphatase: 124 U/L — ABNORMAL HIGH (ref 39–117)
Bilirubin, Direct: 0.2 mg/dL (ref 0.0–0.3)
Total Bilirubin: 0.8 mg/dL (ref 0.2–1.2)
Total Protein: 7.4 g/dL (ref 6.0–8.3)

## 2022-10-24 ENCOUNTER — Telehealth: Payer: Self-pay | Admitting: Pulmonary Disease

## 2022-10-24 DIAGNOSIS — J849 Interstitial pulmonary disease, unspecified: Secondary | ICD-10-CM

## 2022-10-24 MED ORDER — OFEV 100 MG PO CAPS: 100.0000 mg | ORAL_CAPSULE | Freq: Two times a day (BID) | ORAL | 1 refills | Status: DC

## 2022-10-24 NOTE — Telephone Encounter (Signed)
Refill sent for OFEV to [facility] for Ofev: [phone]  Dose: 100 mg twice daily  Last OV: 09/25/2022 Provider: Dr. Vaughan Browner  Next OV: not yet scheduled  LFTs on 10/13/22  Left VM for patient as we did not receive renewal information for 2024 PAP re-enrollment. Requested return call if any clarification needed on process.  Knox Saliva, PharmD, MPH, BCPS Clinical Pharmacist (Rheumatology and Pulmonology)

## 2022-10-24 NOTE — Telephone Encounter (Signed)
Pt wife says Pt is out of his Ofev and needs a refill, leave msg for pt if not answered  Pharmacy:

## 2022-10-25 NOTE — Telephone Encounter (Signed)
Received fax from [facility] requesting patient portion of renewal application for PAP for oFev. ATC patient today to review if they requested application form [employer]. Unable to reach. Left VM  Knox Saliva, PharmD, MPH, BCPS, CPP Clinical Pharmacist (Rheumatology and Pulmonology)

## 2022-11-01 DIAGNOSIS — R059 Cough, unspecified: Secondary | ICD-10-CM | POA: Diagnosis not present

## 2022-11-01 DIAGNOSIS — J300 Vasomotor rhinitis: Secondary | ICD-10-CM | POA: Diagnosis not present

## 2022-11-03 ENCOUNTER — Telehealth: Payer: Self-pay | Admitting: Pulmonary Disease

## 2022-11-03 NOTE — Telephone Encounter (Signed)
Pt. Wife calling got a letter about his meds in mail about pt. Assistance  and the need advise on what to do he about to run out of meds

## 2022-11-03 NOTE — Telephone Encounter (Signed)
Called patient's wife but the call went straight to VM. Left message for her to call us back.

## 2022-11-06 NOTE — Telephone Encounter (Signed)
Called and spoke with patient's wife Todd [name]. She stated that Todd [name] received a letter from [facility] requesting for a new application to be submitted for his Ofev. They are confused because they were under the impression that the approval late last year was good for a year. She stated that they did not request a new application for the patient assistance for Ofev. I advised her that per another message in his chart, our pharmacy team had received a similar form from [facility].   I advised her I would send this message to the pharmacy team to let them know that she nor the patient requested a new application. She verbalized understanding. She is concerned that he will run of the Ofev.

## 2022-11-06 NOTE — Telephone Encounter (Signed)
PT wife ret Cherina's call. Pls call back. @ [phone]

## 2022-11-07 ENCOUNTER — Telehealth: Payer: Self-pay | Admitting: Pharmacist

## 2022-11-07 NOTE — Telephone Encounter (Signed)
I spoke with patient's wife - she states that she did call [employer] but they did not offer to mail them renewal application for patient's Ofev  I advised that we will mail application to their home and they can mail it back to clinic with income documents. We can submit on their behalf  Patient has 14 day supply remaining at home  Knox Saliva, PharmD, MPH, BCPS, CPP Clinical Pharmacist (Rheumatology and Pulmonology)

## 2022-11-07 NOTE — Telephone Encounter (Signed)
I spoke with patient's wife - she states that she did call [employer] but they did not offer to mail them renewal application for patient's Ofev  I advised that we will mail application to their home and they can mail it back to clinic with income documents. We can submit on their behalf  Prescriber portion placed in Dr. Matilde Bash mailbox today.  Patient has 14 day supply remaining at home  Knox Saliva, PharmD, MPH, BCPS, CPP Clinical Pharmacist (Rheumatology and Pulmonology)

## 2022-11-08 NOTE — Telephone Encounter (Signed)
Provider portion received and placed in "awaiting response" folder.

## 2022-11-14 NOTE — Telephone Encounter (Signed)
Received patient forms. Submitted Patient Assistance Application to [employer] for OFEV along with provider portion, patient portion, med list, insurance card copy, PA and income documents. Will update patient when we receive a response.  Phone #: [phone] Fax #: [phone]  Knox Saliva, PharmD, MPH, BCPS, CPP Clinical Pharmacist (Rheumatology and Pulmonology)

## 2022-11-15 NOTE — Telephone Encounter (Signed)
Received a fax from  [facility] regarding an approval for [place] patient assistance from 11/15/2022 to 08/21/2023. Approval letter sent to scan center.  Phone #: [phone] Fax #: [phone]   Called pt to provide update and spoke to wife, she verbalized understanding. Nothing further needed at this time.

## 2022-11-15 NOTE — Telephone Encounter (Signed)
Resubmitting application due to insurance info being illegible. Will continue to await response.

## 2022-11-26 ENCOUNTER — Encounter: Payer: Self-pay | Admitting: Internal Medicine

## 2022-12-18 ENCOUNTER — Ambulatory Visit (INDEPENDENT_AMBULATORY_CARE_PROVIDER_SITE_OTHER): Payer: PPO

## 2022-12-18 ENCOUNTER — Encounter: Payer: Self-pay | Admitting: Internal Medicine

## 2022-12-18 ENCOUNTER — Ambulatory Visit (INDEPENDENT_AMBULATORY_CARE_PROVIDER_SITE_OTHER): Payer: PPO | Admitting: Internal Medicine

## 2022-12-18 VITALS — BP 132/78 | HR 80 | Ht 68.0 in | Wt 160.0 lb

## 2022-12-18 DIAGNOSIS — R5382 Chronic fatigue, unspecified: Secondary | ICD-10-CM | POA: Diagnosis not present

## 2022-12-18 DIAGNOSIS — R053 Chronic cough: Secondary | ICD-10-CM

## 2022-12-18 DIAGNOSIS — E538 Deficiency of other specified B group vitamins: Secondary | ICD-10-CM | POA: Diagnosis not present

## 2022-12-18 DIAGNOSIS — E7849 Other hyperlipidemia: Secondary | ICD-10-CM

## 2022-12-18 DIAGNOSIS — E1151 Type 2 diabetes mellitus with diabetic peripheral angiopathy without gangrene: Secondary | ICD-10-CM | POA: Diagnosis not present

## 2022-12-18 DIAGNOSIS — I10 Essential (primary) hypertension: Secondary | ICD-10-CM

## 2022-12-18 DIAGNOSIS — J8410 Pulmonary fibrosis, unspecified: Secondary | ICD-10-CM | POA: Diagnosis not present

## 2022-12-18 DIAGNOSIS — I4891 Unspecified atrial fibrillation: Secondary | ICD-10-CM | POA: Diagnosis not present

## 2022-12-18 LAB — COMPREHENSIVE METABOLIC PANEL
ALT: 17 U/L (ref 0–53)
AST: 26 U/L (ref 0–37)
Albumin: 3.8 g/dL (ref 3.5–5.2)
Alkaline Phosphatase: 78 U/L (ref 39–117)
BUN: 26 mg/dL — ABNORMAL HIGH (ref 6–23)
CO2: 25 mEq/L (ref 19–32)
Calcium: 9.9 mg/dL (ref 8.4–10.5)
Chloride: 102 mEq/L (ref 96–112)
Creatinine, Ser: 0.8 mg/dL (ref 0.40–1.50)
GFR: 83.95 mL/min (ref >60.00)
Glucose, Bld: 81 mg/dL (ref 70–99)
Potassium: 4.7 mEq/L (ref 3.5–5.1)
Sodium: 136 mEq/L (ref 135–145)
Total Bilirubin: 0.6 mg/dL (ref 0.2–1.2)
Total Protein: 8.2 g/dL (ref 6.0–8.3)

## 2022-12-18 LAB — LIPID PANEL
Cholesterol: 128 mg/dL (ref 0–200)
HDL: 36.9 mg/dL — ABNORMAL LOW (ref >39.00)
LDL Cholesterol: 63 mg/dL (ref 0–99)
NonHDL: 91.08
Total CHOL/HDL Ratio: 3
Triglycerides: 142 mg/dL (ref 0.0–149.0)
VLDL: 28.4 mg/dL (ref 0.0–40.0)

## 2022-12-18 LAB — CBC WITH DIFFERENTIAL/PLATELET
Basophils Absolute: 0 10*3/uL (ref 0.0–0.1)
Basophils Relative: 0.2 % (ref 0.0–3.0)
Eosinophils Absolute: 0.1 10*3/uL (ref 0.0–0.7)
Eosinophils Relative: 1.1 % (ref 0.0–5.0)
HCT: 38 % — ABNORMAL LOW (ref 39.0–52.0)
Hemoglobin: 12.9 g/dL — ABNORMAL LOW (ref 13.0–17.0)
Lymphocytes Relative: 30.8 % (ref 12.0–46.0)
Lymphs Abs: 2.8 10*3/uL (ref 0.7–4.0)
MCHC: 33.8 g/dL (ref 30.0–36.0)
MCV: 105.2 fl — ABNORMAL HIGH (ref 78.0–100.0)
Monocytes Absolute: 0.8 10*3/uL (ref 0.1–1.0)
Monocytes Relative: 9 % (ref 3.0–12.0)
Neutro Abs: 5.3 10*3/uL (ref 1.4–7.7)
Neutrophils Relative %: 58.9 % (ref 43.0–77.0)
Platelets: 194 10*3/uL (ref 150.0–400.0)
RBC: 3.62 Mil/uL — ABNORMAL LOW (ref 4.22–5.81)
RDW: 16.2 % — ABNORMAL HIGH (ref 11.5–15.5)
WBC: 9.1 10*3/uL (ref 4.0–10.5)

## 2022-12-18 LAB — TSH: TSH: 3.05 u[IU]/mL (ref 0.35–5.50)

## 2022-12-18 LAB — HEMOGLOBIN A1C: Hgb A1c MFr Bld: 8.1 % — ABNORMAL HIGH (ref 4.6–6.5)

## 2022-12-18 LAB — VITAMIN B12: Vitamin B-12: 602 pg/mL (ref 211–911)

## 2022-12-18 MED ORDER — AMITRIPTYLINE HCL 10 MG PO TABS: 10.0000 mg | ORAL_TABLET | Freq: Every day | ORAL | 5 refills | Status: DC

## 2022-12-18 NOTE — Assessment & Plan Note (Signed)
Chronic Cough is worse, increased SOB Cough is relentless Has tried multiple things in the past - nothing has helped Gabapentin has not helped - will stop Trial of amitriptyline 10 mg HS - can increase to 20 mg if tolerated Referral to ENT, pulmonary for second opinions

## 2022-12-18 NOTE — Assessment & Plan Note (Signed)
Chronic Taking B12 Check level 

## 2022-12-18 NOTE — Progress Notes (Signed)
Subjective:    Patient ID: EZEQUIEL MACAULEY, male    DOB: [birth year], 82 y.o.   MRN: [id number]      HPI Heliodoro is here for  Chief Complaint  Patient presents with   Fatigue    Not sleeping due to cough, not eating well, coughs so much his chest hurts at times (wants possible x-ray to rule out pneumonia) Per daughter patient has been depressed since cough is worse due to cough and limited to what he is able to do (wants him evaluated) Concerned about his over all health; Wants to know if nebulizer would help; Wants to know if Dr. Shan Levans (pulmonary) would be a good for patient to see for a second opinion.    Cough - chronic, dry -- has gotten worse.   SOB is worse.  No energy. No appetite.  Not sleeping well.   Interested in second opinions.    Also here for follow up of chronic medical problems.  - eating little, but watching the sugars.  Taking his medication.    Medications and allergies reviewed with patient and updated if appropriate.  Current Outpatient Medications on File Prior to Visit  Medication Sig Dispense Refill   albuterol (VENTOLIN HFA) 108 (90 Base) MCG/ACT inhaler Inhale 2 puffs into the lungs every 6 (six) hours as needed for wheezing or shortness of breath. 8 g 6   cholecalciferol (VITAMIN D3) 25 MCG (1000 UNIT) tablet Take 1,000 Units by mouth daily.     Continuous Blood Gluc Receiver (FREESTYLE LIBRE 14 DAY READER) DEVI UAD to check sugars.  E11.9 1 each 0   Continuous Blood Gluc Sensor (FREESTYLE LIBRE 14 DAY SENSOR) MISC UAD to check sugars.  E11.9 2 each 5   famotidine (PEPCID) 40 MG tablet Take 1 tablet (40 mg total) by mouth daily. 90 tablet 1   gabapentin (NEURONTIN) 300 MG capsule Take 1 capsule (300 mg total) by mouth 2 (two) times daily. 60 capsule 5   glipiZIDE (GLUCOTROL) 5 MG tablet TAKE 2 TABLETS BY MOUTH WITH BREAKFAST AND 1 TABLET WITH DINNER. 270 tablet 1   HYDROcodone bit-homatropine (HYCODAN) 5-1.5 MG/5ML syrup Take 5 mLs by mouth every  6 (six) hours as needed for cough. 240 mL 0   levocetirizine (XYZAL) 5 MG tablet TAKE 1 TABLET BY MOUTH EVERY EVENING. 30 tablet 5   metFORMIN (GLUCOPHAGE-XR) 500 MG 24 hr tablet 2 tabs with breakfast, 1 tab with lunch, 2 tabs with dinner 450 tablet 2   metoprolol tartrate (LOPRESSOR) 50 MG tablet Take 1 tablet (50 mg total) by mouth 2 (two) times daily. 180 tablet 3   Nintedanib (OFEV) 100 MG CAPS Take 1 capsule (100 mg total) by mouth 2 (two) times daily. 180 capsule 1   nitroGLYCERIN (NITROSTAT) 0.4 MG SL tablet Place 1 tablet (0.4 mg total) under the tongue every 5 (five) minutes as needed for chest pain. 100 tablet 3   Omega-3 Fatty Acids (FISH OIL PO) Take 1 tablet by mouth daily.     omeprazole (PRILOSEC) 40 MG capsule TAKE 1 CAPSULE BY MOUTH 2 TIMES DAILY. 120 capsule 5   ONETOUCH DELICA LANCETS 33G MISC TEST BLOOD SUGAR ONCE DAILY 100 each 3   ONETOUCH VERIO test strip USE UP TO 4 TIMES A DAY AS DIRECTED 100 strip 0   predniSONE (DELTASONE) 10 MG tablet Take 1 tablet (10 mg total) by mouth daily with breakfast. 30 tablet 2   rivaroxaban (XARELTO) 20 MG TABS tablet Take  1 tablet (20 mg total) by mouth daily with supper. 90 tablet 3   simvastatin (ZOCOR) 40 MG tablet Take 1 tablet (40 mg total) by mouth every evening. 90 tablet 2   No current facility-administered medications on file prior to visit.    Review of Systems  Constitutional:  Positive for appetite change (decreased). Negative for fever.  Respiratory:  Positive for cough, shortness of breath and wheezing.   Cardiovascular:  Positive for chest pain (intermittent with coughing - 4-6 months) and palpitations (seldom). Negative for leg swelling.  Gastrointestinal:  Negative for abdominal pain.  Musculoskeletal:        Leg cramps in right leg  Neurological:  Negative for light-headedness and headaches.       Objective:   Vitals:   12/18/22 1450  BP: 132/78  Pulse: 80  SpO2: 92%   BP Readings from Last 3 Encounters:   12/18/22 132/78  09/25/22 136/68  08/28/22 (!) 140/82   Wt Readings from Last 3 Encounters:  12/18/22 160 lb (72.6 kg)  09/25/22 169 lb (76.7 kg)  08/28/22 171 lb 3.2 oz (77.7 kg)   Body mass index is 24.33 kg/m.    Physical Exam Constitutional:      General: He is not in acute distress.    Appearance: Normal appearance. He is not ill-appearing.  HENT:     Head: Normocephalic and atraumatic.  Eyes:     Conjunctiva/sclera: Conjunctivae normal.  Cardiovascular:     Rate and Rhythm: Normal rate and regular rhythm.     Heart sounds: Normal heart sounds.  Pulmonary:     Effort: Pulmonary effort is normal. No respiratory distress.     Breath sounds: Rales present. No wheezing.  Musculoskeletal:     Right lower leg: No edema.     Left lower leg: No edema.  Skin:    General: Skin is warm and dry.     Findings: No rash.  Neurological:     Mental Status: He is alert. Mental status is at baseline.  Psychiatric:        Mood and Affect: Mood normal.            Assessment & Plan:    See Problem List for Assessment and Plan of chronic medical problems.

## 2022-12-18 NOTE — Assessment & Plan Note (Signed)
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 OFEV helping - ? Stay on it, ? Anything else for cough

## 2022-12-18 NOTE — Assessment & Plan Note (Signed)
Chronic Following with cardiology Onset alto and metoprolol CBC, CMP, tsh

## 2022-12-18 NOTE — Assessment & Plan Note (Signed)
Chronic Ck labs - cbc, tsh, cmp Possibly related to OFEV - will discuss w/ pulmonary

## 2022-12-18 NOTE — Assessment & Plan Note (Signed)
Chronic   Lab Results  Component Value Date   HGBA1C 8.1 (H) 12/18/2022   Sugars not controlled Check A1c Continue metformin 1000 mg twice daily, glipizide 10 mg with breakfast, 1 pill with dinner Stressed regular exercise, diabetic diet

## 2022-12-18 NOTE — Assessment & Plan Note (Signed)
Chronic Check lipid panel  Continue simvastatin 40 mg daily Regular exercise and healthy diet encouraged  

## 2022-12-18 NOTE — Assessment & Plan Note (Signed)
Chronic Blood pressure well controlled CMP Continue metoprolol 50 mg twice daily 

## 2022-12-18 NOTE — Patient Instructions (Addendum)
     Have a chest xray today.    Blood work was ordered.       Medications changes include :   stop gabapentin.  Start amitriptyline 10 mg at night - increase to 20 mg at night if tolerated well    A referral was ordered for ENT at [facility], pulmonary.     Someone will call you to schedule an appointment.    Return in about 6 months (around 06/19/2023) for Physical Exam - cancel appt next week.

## 2022-12-21 ENCOUNTER — Encounter: Payer: Self-pay | Admitting: Internal Medicine

## 2022-12-21 ENCOUNTER — Telehealth: Payer: Self-pay | Admitting: Internal Medicine

## 2022-12-21 MED ORDER — DAPAGLIFLOZIN PROPANEDIOL 10 MG PO TABS
10.0000 mg | ORAL_TABLET | Freq: Every day | ORAL | 5 refills | Status: DC
Start: 2022-12-21 — End: 2023-09-19

## 2022-12-21 NOTE — Telephone Encounter (Signed)
Pt wife called on the behalf of her husband wanted to know do he need to stop some of his other medications since Dr. Lawerance Bach put him on a new medication please call pt with update.

## 2022-12-21 NOTE — Addendum Note (Signed)
Addended by: [name] on: 12/21/2022 08:58 AM   Modules accepted: Orders

## 2022-12-21 NOTE — Telephone Encounter (Signed)
Notified wife went over med for Diabetes. She states MD added [name]. Wanting to know does he continue taking the Metformin and Glipizide. Inform pt per MD records yes.. The only med she d/c was the gabapentin.Marland KitchenRaechel Chute

## 2022-12-25 ENCOUNTER — Encounter: Payer: HMO | Admitting: Internal Medicine

## 2022-12-25 MED ORDER — AZITHROMYCIN 250 MG PO TABS
ORAL_TABLET | ORAL | 0 refills | Status: DC
Start: 2022-12-25 — End: 2023-01-09

## 2022-12-25 MED ORDER — CEFPODOXIME PROXETIL 200 MG PO TABS: 200.0000 mg | ORAL_TABLET | Freq: Two times a day (BID) | ORAL | 0 refills | Status: AC

## 2022-12-29 ENCOUNTER — Encounter: Payer: Self-pay | Admitting: Pulmonary Disease

## 2022-12-29 ENCOUNTER — Ambulatory Visit: Payer: PPO | Admitting: Pulmonary Disease

## 2022-12-29 VITALS — BP 130/76 | HR 97 | Ht 68.0 in | Wt 159.2 lb

## 2022-12-29 DIAGNOSIS — J849 Interstitial pulmonary disease, unspecified: Secondary | ICD-10-CM | POA: Diagnosis not present

## 2022-12-29 DIAGNOSIS — Z5181 Encounter for therapeutic drug level monitoring: Secondary | ICD-10-CM | POA: Diagnosis not present

## 2022-12-29 MED ORDER — PREDNISONE 10 MG PO TABS: 10.0000 mg | ORAL_TABLET | Freq: Every day | ORAL | 2 refills | Status: DC

## 2022-12-29 MED ORDER — IPRATROPIUM-ALBUTEROL 0.5-2.5 (3) MG/3ML IN SOLN: 3.0000 mL | RESPIRATORY_TRACT | 3 refills | Status: DC | PRN

## 2022-12-29 NOTE — Progress Notes (Signed)
Todd [name]    [id number]    1941-01-23  Primary Care Physician:Burns, [name], MD  Referring Physician: [name], MD [address] 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 is a 82 y.o. with history of allergies, rhinitis, atrial fibrillation, hypertension, hyperlipidemia, diabetes, GERD Follow-up for asbestosis, chronic cough, COVID-19 infection in September 2021  Developed COVID-19 in September 2021.  He was sick for about a week.  Treated with outpatient monoclonal antibody therapy and did not require hospitalization His cough has worsened since his COVID-19 infection.  He was put on prednisone for a month with slow taper with improvement in symptoms Continues to have persistent cough.  ENT examination in 2022 was normal  Started Esbriet in January 2023 for progressive pulmonary fibrosis.  But had to stop in 3 months after he developed significant sunburn. 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  Gabapentin changed to amitriptyline Recently had a chest x-ray at primary care which showed some new reticulonodular opacity and has been given cefpodoxime and azithromycin. He has been referred to Dr. Delford Field, ENT at [facility] for reevaluation of cough  Outpatient Encounter Medications as of 12/29/2022  Medication Sig   albuterol (VENTOLIN HFA) 108 (90 Base) MCG/ACT inhaler Inhale 2 puffs into the lungs every 6 (six) hours as needed for wheezing or shortness of breath.   amitriptyline (ELAVIL) 10 MG tablet Take 1-2 tablets (10-20 mg total) by mouth at bedtime.    azithromycin (ZITHROMAX) 250 MG tablet Take two tabs the first day and then one tab daily for four days   cefpodoxime (VANTIN) 200 MG tablet Take 1 tablet (200 mg total) by mouth 2 (two) times daily for 10 days.   cholecalciferol (VITAMIN D3) 25 MCG (1000 UNIT) tablet Take 1,000 Units by mouth daily.   Continuous Blood Gluc Receiver (FREESTYLE LIBRE 14 DAY READER) DEVI UAD to check sugars.  E11.9   Continuous Blood Gluc Sensor (FREESTYLE LIBRE 14 DAY SENSOR) MISC UAD to check sugars.  E11.9   dapagliflozin propanediol ([name]) 10 MG TABS tablet Take 1 tablet (10 mg total) by mouth daily before breakfast.   famotidine (PEPCID) 40 MG tablet Take 1 tablet (40 mg total) by mouth daily.   glipiZIDE (GLUCOTROL) 5 MG tablet TAKE 2 TABLETS BY MOUTH WITH BREAKFAST AND 1 TABLET WITH DINNER.   HYDROcodone bit-homatropine (HYCODAN) 5-1.5 MG/5ML syrup Take 5 mLs by mouth every 6 (six) hours as needed for cough.   levocetirizine (XYZAL) 5 MG tablet TAKE 1 TABLET BY MOUTH EVERY EVENING.   metFORMIN (GLUCOPHAGE-XR) 500 MG 24 hr tablet 2 tabs with breakfast, 1 tab with lunch, 2 tabs with dinner   metoprolol tartrate (LOPRESSOR) 50 MG tablet Take 1 tablet (50 mg total) by mouth 2 (two) times daily.   Nintedanib (OFEV) 100 MG CAPS Take 1 capsule (100 mg total) by mouth 2 (two) times daily.   nitroGLYCERIN (NITROSTAT) 0.4 MG SL tablet Place 1 tablet (0.4 mg total) under the tongue every 5 (five) minutes as needed for chest pain.   Omega-3 Fatty Acids (FISH OIL  PO) Take 1 tablet by mouth daily.   omeprazole (PRILOSEC) 40 MG capsule TAKE 1 CAPSULE BY MOUTH 2 TIMES DAILY.   ONETOUCH DELICA LANCETS 33G MISC TEST BLOOD SUGAR ONCE DAILY   ONETOUCH VERIO test strip USE UP TO 4 TIMES A DAY AS DIRECTED   predniSONE (DELTASONE) 10 MG tablet Take 1 tablet (10 mg total) by mouth daily with breakfast.   rivaroxaban (XARELTO) 20 MG TABS tablet Take 1 tablet (20 mg total) by mouth daily with supper.   simvastatin (ZOCOR) 40 MG  tablet Take 1 tablet (40 mg total) by mouth every evening.   No facility-administered encounter medications on file as of 12/29/2022.   Physical Exam: Blood pressure 130/76, pulse 97, height 5\' 8"  (1.727 m), weight 159 lb 3.2 oz (72.2 kg), SpO2 96 %. Gen:      No acute distress HEENT:  EOMI, sclera anicteric Neck:     No masses; no thyromegaly Lungs:    Clear to auscultation bilaterally; normal respiratory effort CV:         Regular rate and rhythm; no murmurs Abd:      + bowel sounds; soft, non-tender; no palpable masses, no distension Ext:    No edema; adequate peripheral perfusion Skin:      Warm and dry; no rash Neuro: alert and oriented x 3 Psych: normal mood and affect   Data Reviewed: Imaging Chest x-ray 12/04/16-stable interstitial opacities CT abdomen 11/20/13-mild reticular opacities at the bases CT high-resolution 01/02/17- subpleural reticulation, mild traction bronchiectasis, possible early honeycombing with basal gradient. 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  Slightly worse compared to 2018 High-res CT 03/21/2019-probable UIP High-res CT 03/30/2020-unchanged probable UIP fibrosis High-res CT 09/02/2020- unchanged probable UIP fibrosis High-res CT 06/30/2021-slight worsening of fibrosis and probable UIP pattern Chest x-ray 12/18/2022-reticulonodular opacities left greater than right I have reviewed the images personally  PFTs  03/20/17- FVC 2.74 (72%], FEV1 2.38 (87%), F/F 87, TLC 62%, DLCO 15.89 (56%)  01/15/2018-FVC 2.74 [73%], FEV1 2.33 [86%], F/F 85, TLC 71%, DLCO 13.13 (46%)  07/23/2018-FVC 2.51 [69%), FEV1 2.10 [78%), DLCO 15.16 (53%) Mild restriction, moderate-severe diffusion defect.  04/02/2019 FVC 2.70 [72%], FEV1 2.18 [82%], F/F 81, DLCO 13.8 [60%] Moderate diffusion defect  08/31/2020 FVC 2.52 [65%], FEV1 2.25 [82%], F/F 89, TLC 4.64 [69%], DLCO 20.59 [88%] Mild restriction.  Improvement in diffusion capacity  08/24/2021 FVC 2.03 [53%], FEV1 1.53  [56%], F/F 75, TLC 2.78 [41%], DLCO 13.45 [58%] Severe restriction, moderate diffusion defect  07/24/2022 Unable to complete due to cough  FENO 12/18/16- 12  Labs ILD serologies 01/09/17- all negative except for mild elevation in SCL 75.  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  PFTs today show significant worsening of restriction and diffusion impairment.  Follow-up CT from last month reviewed with slight interval progression of fibrosis.  Started Esbriet in January 2023 but we had to stop it as he developed significant sunburn. Now on Ofev after review of CT scan showing interval progression of pulmonary fibrosis we have decided to initiate therapy.  Started at a lower dose of 100 mg twice daily as he already has some diarrhea at baseline.  Recent hepatic panel last month is within normal limits  Community-acquired pneumonia Currently on antibiotics for abnormal chest x-ray Order follow-up CT in  3 months.  Chronic cough Likely secondary to pulmonary fibrosis He has been treated adequately for GERD and tried on antihistamines which did not help ENT examination in 2022 is unremarkable.  He has a follow-up evaluation by Dr. 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  He had mild symptoms which did not require hospitalization  Health maintenance 01/15/2015-Prevnar 13 05/05/2010-Pneumovax  Plan/Recommendations: Continue Ofev 100 mg twice daily Prednisone 10 mg a day PPI Codeine cough syrup, amitriptyline, ENT evaluation DuoNebs Follow-up CT scan  Chilton Greathouse MD [facility] 12/29/2022,  1:05 PM  CC: [name], MD

## 2022-12-29 NOTE — Patient Instructions (Addendum)
Continue the Ofev.  Your recent labs are normal I am glad you are seeing ENT at [facility] for your ongoing cough I will also check if there are any clinical trials for this Will order high-res CT in 3 months Return to clinic in 3 months after CT

## 2022-12-29 NOTE — Addendum Note (Signed)
Addended by: Bradd Canary L on: 12/29/2022 02:26 PM   Modules accepted: Orders

## 2023-01-02 IMAGING — DX DG SHOULDER 2+V*R*
3 series · 3 of 3 positions shown · non-contrast
Comparison: None.

CLINICAL DATA: Right shoulder pain.

EXAM:
RIGHT SHOULDER - 2+ VIEW

[shoulder ap (1 of 2)]
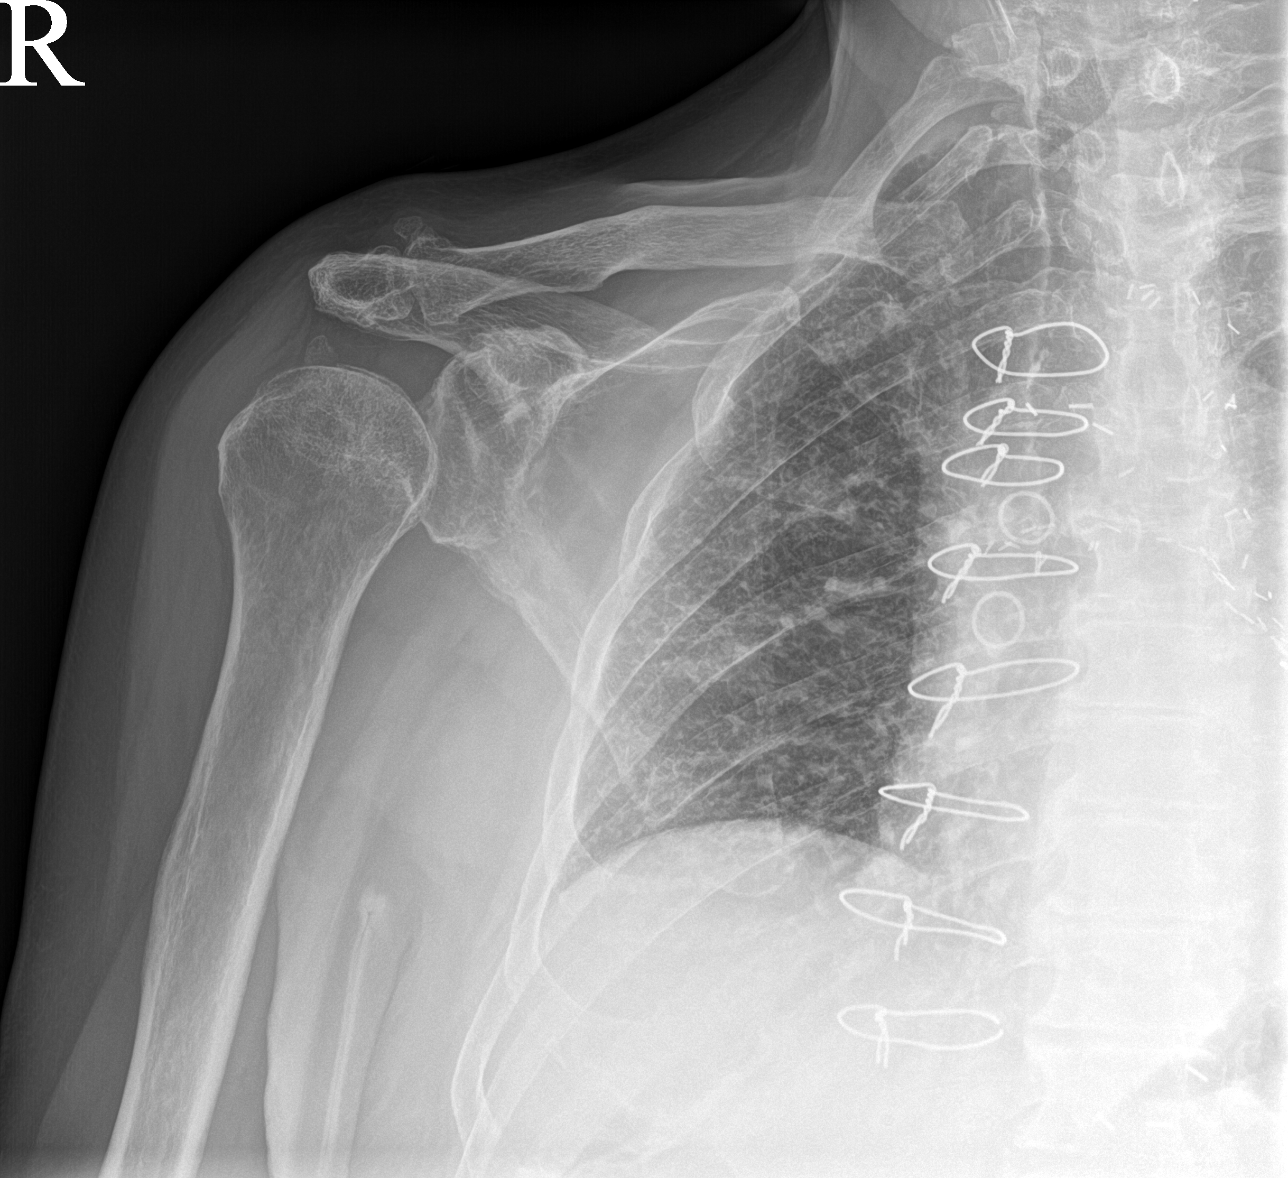

[shoulder ap (2 of 2)]
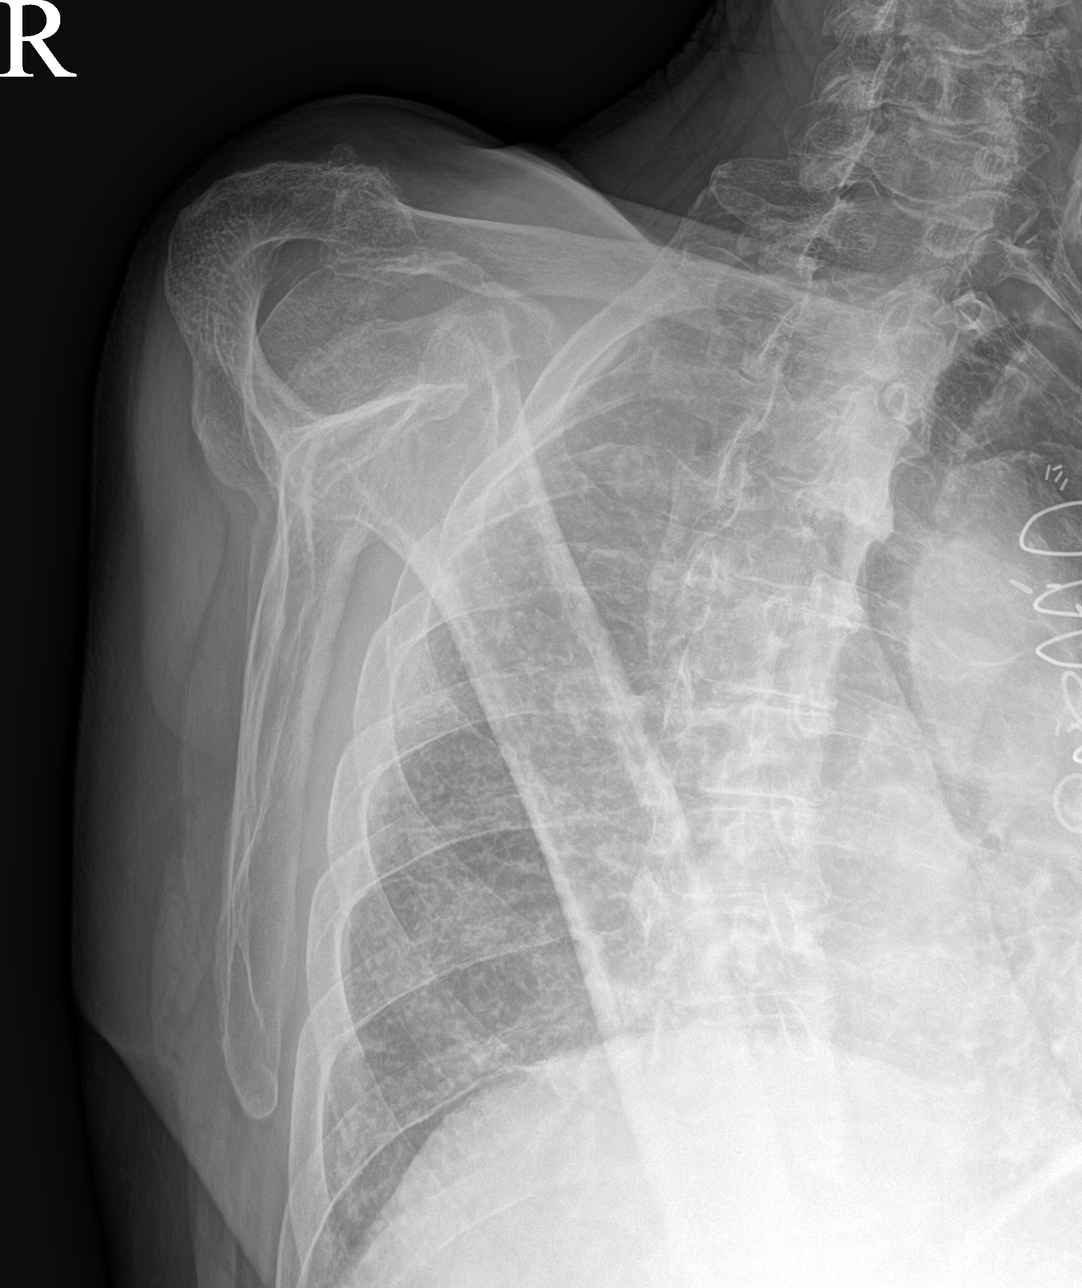

[shoulder axial]
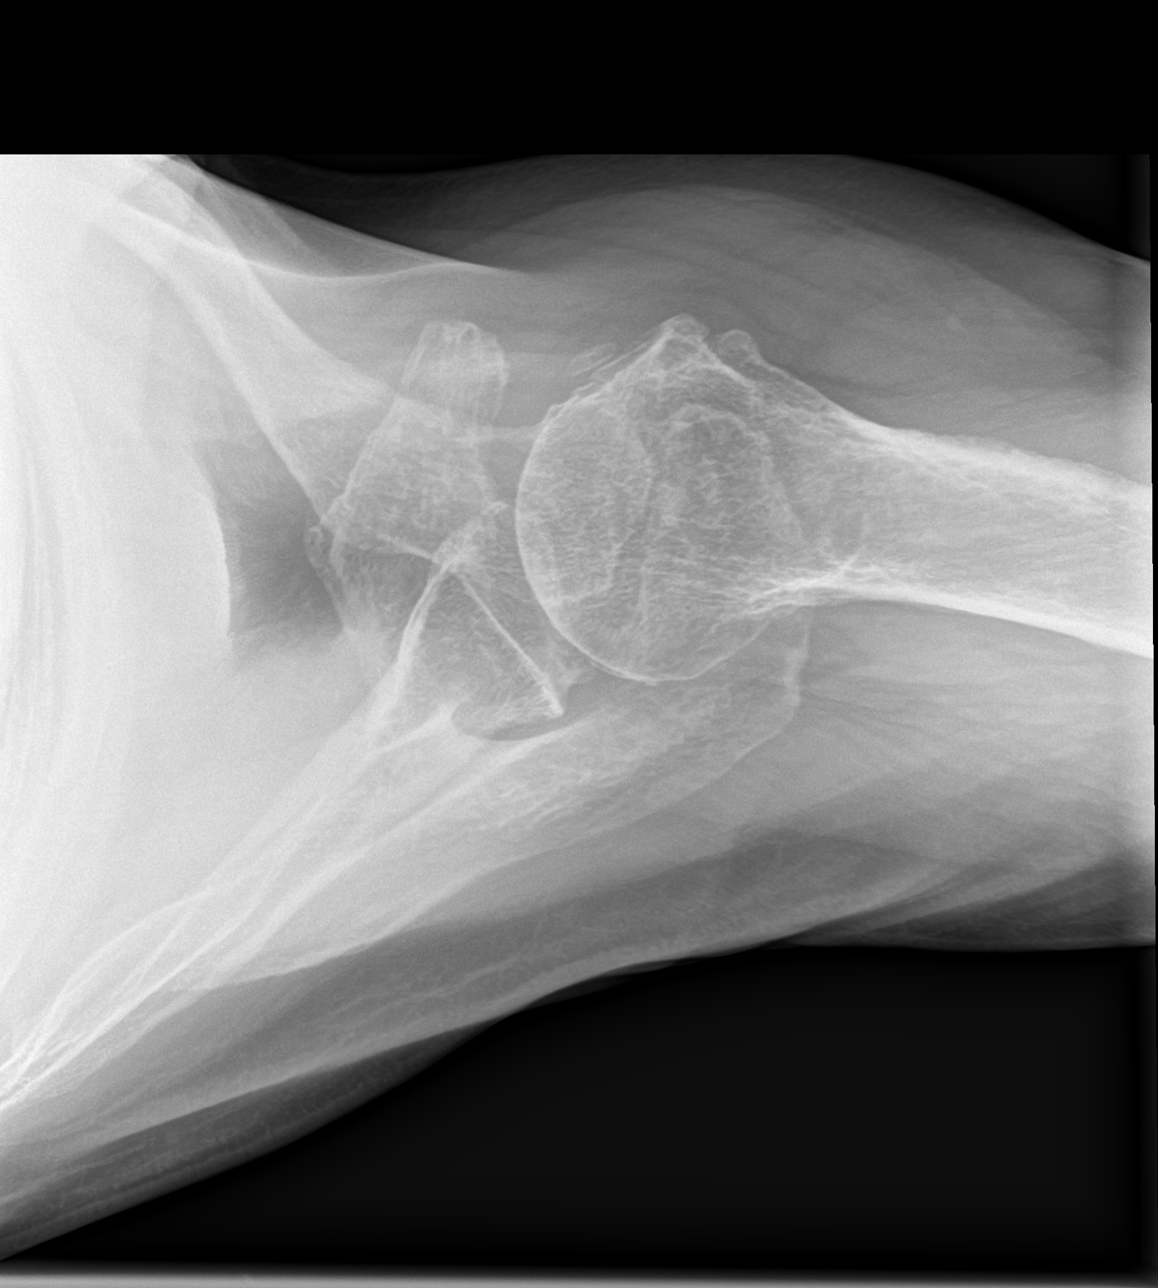

[3 of 3 positions shown; findings below may reference images not displayed]

FINDINGS: No acute fracture or dislocation is identified. Moderate joint space
narrowing and marginal spurring are noted at the acromioclavicular
joint. There is also mild glenohumeral spurring with a small soft
tissue calcification/well corticated ossicle noted along the
superior aspect of the humeral head. Sequelae of CABG are noted.
IMPRESSION: 1. No acute osseous abnormality identified.
2. Acromioclavicular and glenohumeral osteoarthrosis.

## 2023-01-03 DIAGNOSIS — J849 Interstitial pulmonary disease, unspecified: Secondary | ICD-10-CM | POA: Diagnosis not present

## 2023-01-07 ENCOUNTER — Emergency Department (HOSPITAL_COMMUNITY): Payer: PPO

## 2023-01-07 ENCOUNTER — Inpatient Hospital Stay (HOSPITAL_COMMUNITY)
Admission: EM | Admit: 2023-01-07 | Discharge: 2023-01-09 | DRG: 281 | Disposition: A | Discharge status: Home / Self Care | Payer: PPO | Attending: Internal Medicine | Admitting: Internal Medicine

## 2023-01-07 ENCOUNTER — Encounter (HOSPITAL_COMMUNITY): Payer: Self-pay

## 2023-01-07 ENCOUNTER — Other Ambulatory Visit: Payer: Self-pay

## 2023-01-07 DIAGNOSIS — J8410 Pulmonary fibrosis, unspecified: Secondary | ICD-10-CM | POA: Diagnosis not present

## 2023-01-07 DIAGNOSIS — Z833 Family history of diabetes mellitus: Secondary | ICD-10-CM | POA: Diagnosis not present

## 2023-01-07 DIAGNOSIS — I252 Old myocardial infarction: Secondary | ICD-10-CM

## 2023-01-07 DIAGNOSIS — Z8616 Personal history of COVID-19: Secondary | ICD-10-CM

## 2023-01-07 DIAGNOSIS — Z91041 Radiographic dye allergy status: Secondary | ICD-10-CM

## 2023-01-07 DIAGNOSIS — Z800 Family history of malignant neoplasm of digestive organs: Secondary | ICD-10-CM | POA: Diagnosis not present

## 2023-01-07 DIAGNOSIS — I10 Essential (primary) hypertension: Secondary | ICD-10-CM | POA: Diagnosis present

## 2023-01-07 DIAGNOSIS — I214 Non-ST elevation (NSTEMI) myocardial infarction: Secondary | ICD-10-CM | POA: Diagnosis present

## 2023-01-07 DIAGNOSIS — E785 Hyperlipidemia, unspecified: Secondary | ICD-10-CM | POA: Diagnosis present

## 2023-01-07 DIAGNOSIS — Z7901 Long term (current) use of anticoagulants: Secondary | ICD-10-CM | POA: Diagnosis not present

## 2023-01-07 DIAGNOSIS — R079 Chest pain, unspecified: Secondary | ICD-10-CM | POA: Diagnosis not present

## 2023-01-07 DIAGNOSIS — Z951 Presence of aortocoronary bypass graft: Secondary | ICD-10-CM | POA: Diagnosis not present

## 2023-01-07 DIAGNOSIS — E1151 Type 2 diabetes mellitus with diabetic peripheral angiopathy without gangrene: Secondary | ICD-10-CM | POA: Diagnosis not present

## 2023-01-07 DIAGNOSIS — Z8042 Family history of malignant neoplasm of prostate: Secondary | ICD-10-CM

## 2023-01-07 DIAGNOSIS — I21A1 Myocardial infarction type 2: Principal | ICD-10-CM | POA: Diagnosis present

## 2023-01-07 DIAGNOSIS — I25810 Atherosclerosis of coronary artery bypass graft(s) without angina pectoris: Secondary | ICD-10-CM | POA: Diagnosis present

## 2023-01-07 DIAGNOSIS — I480 Paroxysmal atrial fibrillation: Secondary | ICD-10-CM | POA: Diagnosis not present

## 2023-01-07 DIAGNOSIS — Z8249 Family history of ischemic heart disease and other diseases of the circulatory system: Secondary | ICD-10-CM

## 2023-01-07 DIAGNOSIS — I4891 Unspecified atrial fibrillation: Secondary | ICD-10-CM | POA: Diagnosis not present

## 2023-01-07 DIAGNOSIS — E1165 Type 2 diabetes mellitus with hyperglycemia: Secondary | ICD-10-CM | POA: Diagnosis present

## 2023-01-07 DIAGNOSIS — I499 Cardiac arrhythmia, unspecified: Secondary | ICD-10-CM | POA: Diagnosis not present

## 2023-01-07 DIAGNOSIS — R9431 Abnormal electrocardiogram [ECG] [EKG]: Secondary | ICD-10-CM | POA: Diagnosis not present

## 2023-01-07 DIAGNOSIS — Z7984 Long term (current) use of oral hypoglycemic drugs: Secondary | ICD-10-CM

## 2023-01-07 DIAGNOSIS — Z8673 Personal history of transient ischemic attack (TIA), and cerebral infarction without residual deficits: Secondary | ICD-10-CM | POA: Diagnosis not present

## 2023-01-07 DIAGNOSIS — Z79899 Other long term (current) drug therapy: Secondary | ICD-10-CM

## 2023-01-07 DIAGNOSIS — J45909 Unspecified asthma, uncomplicated: Secondary | ICD-10-CM | POA: Diagnosis not present

## 2023-01-07 DIAGNOSIS — R739 Hyperglycemia, unspecified: Secondary | ICD-10-CM

## 2023-01-07 DIAGNOSIS — Z825 Family history of asthma and other chronic lower respiratory diseases: Secondary | ICD-10-CM | POA: Diagnosis not present

## 2023-01-07 DIAGNOSIS — I2510 Atherosclerotic heart disease of native coronary artery without angina pectoris: Secondary | ICD-10-CM | POA: Diagnosis not present

## 2023-01-07 DIAGNOSIS — Z888 Allergy status to other drugs, medicaments and biological substances status: Secondary | ICD-10-CM

## 2023-01-07 DIAGNOSIS — D6869 Other thrombophilia: Secondary | ICD-10-CM | POA: Diagnosis not present

## 2023-01-07 DIAGNOSIS — R000 Tachycardia, unspecified: Secondary | ICD-10-CM | POA: Diagnosis not present

## 2023-01-07 LAB — BASIC METABOLIC PANEL
Anion gap: 11 (ref 5–15)
BUN: 22 mg/dL (ref 8–23)
CO2: 24 mmol/L (ref 22–32)
Calcium: 9.3 mg/dL (ref 8.9–10.3)
Chloride: 105 mmol/L (ref 98–111)
Creatinine, Ser: 1.08 mg/dL (ref 0.61–1.24)
GFR, Estimated: >60 mL/min (ref >60)
Glucose, Bld: 276 mg/dL — ABNORMAL HIGH (ref 70–99)
Potassium: 4.2 mmol/L (ref 3.5–5.1)
Sodium: 140 mmol/L (ref 135–145)

## 2023-01-07 LAB — BRAIN NATRIURETIC PEPTIDE: B Natriuretic Peptide: 790.3 pg/mL — ABNORMAL HIGH (ref 0.0–100.0)

## 2023-01-07 LAB — TROPONIN I (HIGH SENSITIVITY)
Troponin I (High Sensitivity): 3228 ng/L (ref <18)
Troponin I (High Sensitivity): 5977 ng/L (ref <18)

## 2023-01-07 LAB — CBC WITH DIFFERENTIAL/PLATELET
Abs Immature Granulocytes: 0.06 10*3/uL (ref 0.00–0.07)
Basophils Absolute: 0 10*3/uL (ref 0.0–0.1)
Basophils Relative: 0 %
Eosinophils Absolute: 0 10*3/uL (ref 0.0–0.5)
Eosinophils Relative: 0 %
HCT: 37.7 % — ABNORMAL LOW (ref 39.0–52.0)
Hemoglobin: 12.4 g/dL — ABNORMAL LOW (ref 13.0–17.0)
Immature Granulocytes: 1 %
Lymphocytes Relative: 17 %
Lymphs Abs: 1.4 10*3/uL (ref 0.7–4.0)
MCH: 34.7 pg — ABNORMAL HIGH (ref 26.0–34.0)
MCHC: 32.9 g/dL (ref 30.0–36.0)
MCV: 105.6 fL — ABNORMAL HIGH (ref 80.0–100.0)
Monocytes Absolute: 0.8 10*3/uL (ref 0.1–1.0)
Monocytes Relative: 10 %
Neutro Abs: 6.2 10*3/uL (ref 1.7–7.7)
Neutrophils Relative %: 72 %
Platelets: 181 10*3/uL (ref 150–400)
RBC: 3.57 MIL/uL — ABNORMAL LOW (ref 4.22–5.81)
RDW: 16.3 % — ABNORMAL HIGH (ref 11.5–15.5)
WBC: 8.6 10*3/uL (ref 4.0–10.5)
nRBC: 0.2 % (ref 0.0–0.2)

## 2023-01-07 LAB — MAGNESIUM: Magnesium: 1.6 mg/dL — ABNORMAL LOW (ref 1.7–2.4)

## 2023-01-07 MED ORDER — NITROGLYCERIN 0.4 MG SL SUBL: 0.4000 mg | SUBLINGUAL_TABLET | SUBLINGUAL | Status: DC | PRN

## 2023-01-07 MED ORDER — HEPARIN (PORCINE) 25000 UT/250ML-% IV SOLN
1100.0000 [IU]/h | INTRAVENOUS | Status: DC
  Administered 2023-01-07: 900 [IU]/h via INTRAVENOUS
  Filled 2023-01-07: qty 250

## 2023-01-07 MED ORDER — DILTIAZEM HCL-DEXTROSE 125-5 MG/125ML-% IV SOLN (PREMIX)
5.0000 mg/h | INTRAVENOUS | Status: DC
  Administered 2023-01-07: 5 mg/h via INTRAVENOUS
  Administered 2023-01-08: 7 mg/h via INTRAVENOUS
  Filled 2023-01-07 (×4): qty 125

## 2023-01-07 MED ORDER — ACETAMINOPHEN 325 MG PO TABS: 650.0000 mg | ORAL_TABLET | ORAL | Status: DC | PRN

## 2023-01-07 MED ORDER — ASPIRIN 81 MG PO TBEC
81.0000 mg | DELAYED_RELEASE_TABLET | Freq: Every day | ORAL | Status: DC
  Administered 2023-01-08 – 2023-01-09 (×2): 81 mg via ORAL
  Filled 2023-01-07 (×2): qty 1

## 2023-01-07 MED ORDER — ATORVASTATIN CALCIUM 80 MG PO TABS
80.0000 mg | ORAL_TABLET | Freq: Every day | ORAL | Status: DC
  Administered 2023-01-08 – 2023-01-09 (×3): 80 mg via ORAL
  Filled 2023-01-07 (×2): qty 2
  Filled 2023-01-07: qty 1

## 2023-01-07 MED ORDER — INSULIN ASPART 100 UNIT/ML IJ SOLN
0.0000 [IU] | Freq: Three times a day (TID) | INTRAMUSCULAR | Status: DC
  Administered 2023-01-08: 1 [IU] via SUBCUTANEOUS
  Administered 2023-01-09: 3 [IU] via SUBCUTANEOUS

## 2023-01-07 MED ORDER — DILTIAZEM LOAD VIA INFUSION
10.0000 mg | Freq: Once | INTRAVENOUS | Status: AC
  Administered 2023-01-07: 10 mg via INTRAVENOUS
  Filled 2023-01-07: qty 10

## 2023-01-07 MED ORDER — ONDANSETRON HCL 4 MG/2ML IJ SOLN: 4.0000 mg | Freq: Four times a day (QID) | INTRAMUSCULAR | Status: DC | PRN

## 2023-01-07 MED ORDER — MAGNESIUM SULFATE 4 GM/100ML IV SOLN
4.0000 g | Freq: Once | INTRAVENOUS | Status: AC
  Administered 2023-01-08: 4 g via INTRAVENOUS
  Filled 2023-01-07: qty 100

## 2023-01-07 NOTE — ED Provider Notes (Signed)
[name] EMERGENCY DEPARTMENT AT [facility] Provider Note   CSN: [id number] Arrival date & time: 01/07/23  1847    History  Chief Complaint  Patient presents with   Chest Pain    Todd [name] is a 82 y.o. male history of CABG, A-fib on anticoagulation, diabetes here for evaluation of chest pain.  Began yesterday at 9 AM.  Lasted the majority of the day however resolved on the evening time.  Today had additional chest pain beginning around 9 or 10 AM.  Felt like his heart was racing.  No shortness of breath.  He has a chronic cough and has known interstitial lung disease.  He was recently started on albuterol nebulizers this week for his chronic cough.  He is typically able to complete his ADLs without any exertional chest pain or shortness of breath.  He denies any PND, orthopnea, lower extremity swelling, hemoptysis, abdominal pain.  He is compliant with his home medications.  Has not missed any doses of his anticoagulation.  Per EMS patient was found to be in A-fib with RVR.  They gave him 20 mg of Cardizem, 324 ASA  HPI     Home Medications Prior to Admission medications   Medication Sig Start Date End Date Taking? Authorizing Provider  ipratropium (ATROVENT) 0.06 % nasal spray Place 2-4 sprays into both nostrils every 8 (eight) hours as needed for rhinitis. 12/04/22  Yes [provider]  albuterol (VENTOLIN HFA) 108 (90 Base) MCG/ACT inhaler Inhale 2 puffs into the lungs every 6 (six) hours as needed for wheezing or shortness of breath. 04/19/22   Mannam, Colbert Coyer, MD  amitriptyline (ELAVIL) 10 MG tablet Take 1-2 tablets (10-20 mg total) by mouth at bedtime. 12/18/22   [name], MD  azithromycin (ZITHROMAX) 250 MG tablet Take two tabs the first day and then one tab daily for four days 12/25/22   [name], MD  cholecalciferol (VITAMIN D3) 25 MCG (1000 UNIT) tablet Take 1,000 Units by mouth daily.    [provider]  Continuous Blood Gluc Receiver  (FREESTYLE LIBRE 14 DAY READER) DEVI UAD to check sugars.  E11.9 07/25/22   [name], MD  Continuous Blood Gluc Sensor (FREESTYLE LIBRE 14 DAY SENSOR) MISC UAD to check sugars.  E11.9 07/25/22   [name], MD  dapagliflozin propanediol ([name]) 10 MG TABS tablet Take 1 tablet (10 mg total) by mouth daily before breakfast. 12/21/22   Burns, [name], MD  famotidine (PEPCID) 40 MG tablet Take 1 tablet (40 mg total) by mouth daily. 08/03/21   Burns, [name], MD  glipiZIDE (GLUCOTROL) 5 MG tablet TAKE 2 TABLETS BY MOUTH WITH BREAKFAST AND 1 TABLET WITH DINNER. 07/10/22   Burns, [name], MD  HYDROcodone bit-homatropine (HYCODAN) 5-1.5 MG/5ML syrup Take 5 mLs by mouth every 6 (six) hours as needed for cough. 10/31/21   Mannam, Colbert Coyer, MD  ipratropium-albuterol (DUONEB) 0.5-2.5 (3) MG/3ML SOLN Take 3 mLs by nebulization every 4 (four) hours as needed. 12/29/22   Mannam, Colbert Coyer, MD  levocetirizine (XYZAL) 5 MG tablet TAKE 1 TABLET BY MOUTH EVERY EVENING. 09/26/21   Burns, [name], MD  metFORMIN (GLUCOPHAGE-XR) 500 MG 24 hr tablet 2 tabs with breakfast, 1 tab with lunch, 2 tabs with dinner 07/25/22   [name], MD  metoprolol tartrate (LOPRESSOR) 50 MG tablet Take 1 tablet (50 mg total) by mouth 2 (two) times daily. 08/28/22   Rollene Rotunda, MD  Nintedanib (OFEV) 100  MG CAPS Take 1 capsule (100 mg total) by mouth 2 (two) times daily. 10/24/22   Mannam, Colbert Coyer, MD  nitroGLYCERIN (NITROSTAT) 0.4 MG SL tablet Place 1 tablet (0.4 mg total) under the tongue every 5 (five) minutes as needed for chest pain. 08/25/21 08/10/07  Rollene Rotunda, MD  Omega-3 Fatty Acids (FISH OIL PO) Take 1 tablet by mouth daily.    [provider]  omeprazole (PRILOSEC) 40 MG capsule TAKE 1 CAPSULE BY MOUTH 2 TIMES DAILY. 05/08/22   [name], MD  [facility] DELICA LANCETS 33G MISC TEST BLOOD SUGAR ONCE DAILY 01/23/18   [name], MD  [facility] VERIO test strip USE UP TO 4 TIMES A DAY AS DIRECTED 07/06/21   [name], MD  predniSONE (DELTASONE) 10 MG tablet Take 1 tablet (10 mg total) by mouth daily with breakfast. 12/29/22   Mannam, Colbert Coyer, MD  rivaroxaban (XARELTO) 20 MG TABS tablet Take 1 tablet (20 mg total) by mouth daily with supper. 08/28/22   Rollene Rotunda, MD  simvastatin (ZOCOR) 40 MG tablet Take 1 tablet (40 mg total) by mouth every evening. 08/28/22   Rollene Rotunda, MD      Allergies    Amlodipine besylate and Iodinated contrast media    Review of Systems   Review of Systems  Constitutional: Negative.   HENT: Negative.    Respiratory:  Positive for cough (chronic).   Cardiovascular:  Positive for chest pain and palpitations. Negative for leg swelling.  Gastrointestinal: Negative.   Genitourinary: Negative.   Musculoskeletal: Negative.   Skin: Negative.   Neurological: Negative.   All other systems reviewed and are negative.   Physical Exam Updated Vital Signs BP (!) 141/107   Pulse 82   Temp (!) 97.5 F (36.4 C) (Oral)   Resp 19   Ht 5\' 8"  (1.727 m)   Wt 72.1 kg   SpO2 93%   BMI 24.18 kg/m  Physical Exam Vitals and nursing note reviewed.  Constitutional:      General: He is not in acute distress.    Appearance: He is well-developed. He is not ill-appearing, toxic-appearing or diaphoretic.  HENT:     Head: Atraumatic.  Eyes:     Pupils: Pupils are equal, round, and reactive to light.  Cardiovascular:     Rate and Rhythm: Normal rate. Rhythm irregularly irregular.     Pulses: Normal pulses.          Radial pulses are 2+ on the right side and 2+ on the left side.       Dorsalis pedis pulses are 2+ on the right side and 2+ on the left side.     Heart sounds: Normal heart sounds.     Comments: Afib HR 90's Pulmonary:     Effort: Pulmonary effort is normal. No respiratory distress.     Breath sounds: Normal breath sounds and air entry.  Chest:     Chest wall: No mass, deformity, tenderness, crepitus or edema.  Abdominal:     General: Bowel sounds are normal.  There is no distension.     Palpations: Abdomen is soft.     Tenderness: There is no abdominal tenderness.  Musculoskeletal:        General: Normal range of motion.     Cervical back: Normal range of motion and neck supple.     Right lower leg: No tenderness. No edema.     Left lower leg: No tenderness. No edema.  Comments: Slight increase in right calf (chronic per patient) Non tender BLE. Full ROM  Skin:    General: Skin is warm and dry.     Capillary Refill: Capillary refill takes less than 2 seconds.     Comments: No rashes or lesions.  Neurological:     General: No focal deficit present.     Mental Status: He is alert and oriented to person, place, and time.    ED Results / Procedures / Treatments   Labs (all labs ordered are listed, but only abnormal results are displayed) Labs Reviewed  CBC WITH DIFFERENTIAL/PLATELET - Abnormal; Notable for the following components:      Result Value   RBC 3.57 (*)    Hemoglobin 12.4 (*)    HCT 37.7 (*)    MCV 105.6 (*)    MCH 34.7 (*)    RDW 16.3 (*)    All other components within normal limits  BASIC METABOLIC PANEL - Abnormal; Notable for the following components:   Glucose, Bld 276 (*)    All other components within normal limits  MAGNESIUM - Abnormal; Notable for the following components:   Magnesium 1.6 (*)    All other components within normal limits  BRAIN NATRIURETIC PEPTIDE - Abnormal; Notable for the following components:   B Natriuretic Peptide 790.3 (*)    All other components within normal limits  TROPONIN I (HIGH SENSITIVITY) - Abnormal; Notable for the following components:   Troponin I (High Sensitivity) 3,228 (*)    All other components within normal limits  TROPONIN I (HIGH SENSITIVITY) - Abnormal; Notable for the following components:   Troponin I (High Sensitivity) 5,977 (*)    All other components within normal limits  HEPARIN LEVEL (UNFRACTIONATED)  APTT  CBC  LIPOPROTEIN A (LPA)  BASIC METABOLIC  PANEL  LIPID PANEL  PROTIME-INR    EKG EKG Interpretation  Date/Time:  Sunday Jan 07 2023 19:12:22 EDT Ventricular Rate:  107 PR Interval:    QRS Duration: 102 QT Interval:  400 QTC Calculation: 482 R Axis:   14 Text Interpretation: recurrent Atrial flutter new  Abnormal T, consider ischemia, lateral leads Confirmed by Gwyneth Sprout ([id number]) on 01/07/2023 7:16:26 PM  Radiology DG Chest 2 View  Result Date: 01/07/2023 CLINICAL DATA:  Chest pain EXAM: CHEST - 2 VIEW COMPARISON:  12/18/2022 FINDINGS: Low lung volumes. Stable cardiomediastinal silhouette. Sternotomy and CABG. Diffuse bilateral predominantly interstitial opacities greatest in the left mid and lower lung, unchanged from prior. No pleural effusion or pneumothorax. No displaced rib fractures. IMPRESSION: No change from 12/18/2022. Left-greater-than-right interstitial opacities compatible with known pulmonary fibrosis. Superimposed infection or edema are not excluded. Electronically Signed   By: Minerva Fester M.D.   On: 01/07/2023 20:22    Procedures .Critical Care  Performed by: Linwood Dibbles, PA-C Authorized by: Linwood Dibbles, PA-C   Critical care provider statement:    Critical care time (minutes):  35   Critical care was time spent personally by me on the following activities:  Development of treatment plan with patient or surrogate, discussions with consultants, evaluation of patient's response to treatment, examination of patient, ordering and review of laboratory studies, ordering and review of radiographic studies, ordering and performing treatments and interventions, pulse oximetry, re-evaluation of patient's condition and review of old charts     Medications Ordered in ED Medications  diltiazem (CARDIZEM) 1 mg/mL load via infusion 10 mg (10 mg Intravenous Bolus from Bag 01/07/23 2230)    And  diltiazem (CARDIZEM) 125 mg in dextrose 5% 125 mL (1 mg/mL) infusion (5 mg/hr Intravenous New Bag/Given  01/07/23 2228)  heparin ADULT infusion 100 units/mL (25000 units/229mL) (has no administration in time range)  aspirin EC tablet 81 mg (has no administration in time range)  nitroGLYCERIN (NITROSTAT) SL tablet 0.4 mg (has no administration in time range)  acetaminophen (TYLENOL) tablet 650 mg (has no administration in time range)  ondansetron (ZOFRAN) injection 4 mg (has no administration in time range)  atorvastatin (LIPITOR) tablet 80 mg (has no administration in time range)  insulin aspart (novoLOG) injection 0-9 Units (has no administration in time range)    ED Course/ Medical Decision Making/ A&P   82 year old here for evaluation of chest pain and palpitations.  Extensive cardiac history.  He is chronically anticoagulated for A-fib.  Pain began yesterday, resolved and reoccurred this morning.  When EMS arrived patient was found to be in A-fib with RVR with heart rates into the high 140s.  After given Cardizem his chest pain resolved.  He does not appear grossly fluid overloaded.  Will plan on labs, imaging and reassess  Labs and imaging personally viewed and interpreted:  EKG with some T wave inversions, No STEMI CBC without leukocytosis, hemoglobin 12.4 BMP glucose 276 Mag 1.6 Trop 3228---5977 Chest xray interstitial opacities consistent with pulmonary fibrosis BNP 790  Patient reassessed.  He is still in A-fib however is rate controlled into the 90s.  Denies any overt chest pain however states "it feels like there is something there still."  No shortness of breath.  Will touch base with cardiology.  CONSULT with Dr. Welton Flakes with Cards who rec heparin, Cardizem drip. Does not rec cardioversion for Afib at this time unless rates persistently in the 140's and above. Will admit to Cards  Discussed plan with patient, family in room.  He has no contraindications to heparin.  Patient seen and eval by attending, Dr. Anitra Lauth who agrees above treatment, plan disposition.  The patient  appears reasonably stabilized for admission considering the current resources, flow, and capabilities available in the ED at this time, and I doubt any other [facility] requiring further screening and/or treatment in the ED prior to admission.   At this time low suspicion for acute PE, dissection, bacterial infectious process, shock.                             Medical Decision Making Amount and/or Complexity of Data Reviewed Independent Historian: spouse    Details: Wife, daughter External Data Reviewed: labs, radiology, ECG and notes. Labs: ordered. Decision-making details documented in ED Course. Radiology: ordered and independent interpretation performed. Decision-making details documented in ED Course. ECG/medicine tests: ordered and independent interpretation performed. Decision-making details documented in ED Course.  Risk OTC drugs. Prescription drug management. Parenteral controlled substances. Decision regarding hospitalization. Diagnosis or treatment significantly limited by social determinants of health.          Final Clinical Impression(s) / ED Diagnoses Final diagnoses:  Atrial fibrillation with RVR (HCC)  NSTEMI (non-ST elevated myocardial infarction) (HCC)  Hx of CABG  Hyperglycemia    Rx / DC Orders ED Discharge Orders     None         Zully Frane A, PA-C 01/07/23 2307    Gwyneth Sprout, MD 01/11/23 1636

## 2023-01-07 NOTE — H&P (Signed)
Cardiology Admission History and Physical   Patient ID: Todd [name] MRN: [id number]; DOB: 21-Jun-1941   Admission date: 01/07/2023  PCP:  [name], MD   [facility] Providers Cardiologist:  Rollene Rotunda, MD        Chief Complaint:  Chest pain  Patient Profile:   Todd [name] is a 82 y.o. male with pmh sx for CAD s/p CABG in 1999, pAF, HTN, T2DM and pulmonary fibrosis  who is being seen 01/07/2023 for the evaluation of NSTEMI and Afib RVR.  History of Present Illness:   Todd [name] is a 82 y.o. male with pmh sx for CAD s/p CABG in 1999, pAF, HTN, T2DM and pulmonary fibrosis  who is being seen 01/07/2023 for the evaluation of NSTEMI and Afib RVR. He has been doing well till yesterday when he started feeling chest pressure; it went away however it came back today. Pt was on his way to church went he started having CP and drove to the fire station. Per EMS patient was found to be in A-fib with RVR.  They gave him 20 mg of Cardizem, 324 ASA. HR improved-with that his CP also improved. He has chronic coughing from lung fibrosis. He is typically able to complete his ADLs without any exertional chest pain or shortness of breath. No leg swelling. CP is now almost gone. In the ED, he was started on dilt drip and his HR were in 90s. Trop 3228---5977. BNP was 790. EKG showed some TWI. Cardiology was consulted for admission. There has been no new shortness of breath, PND or orthopnea. He says he has not missed doses of xarelto.     Past Medical History:  Diagnosis Date   Allergic rhinitis    Asthma    Atrial fibrillation with rapid ventricular response (HCC)    a. newly diagnosed 11/03/13, spont conv to NSR, placed on xarelto.   CAD (coronary artery disease)    a. 1999; s/p CABG: LIMA to LAD, SVG to OM1, SVG to OM2, SVG to RCA  b. Normal nuc 10/2013 (done because of new onset AF.)   COVID    Diabetes mellitus (HCC)    HLD (hyperlipidemia)    HTN (hypertension)    Nephrolithiasis     Overweight(278.02)    PVD (peripheral vascular disease) (HCC)    Carotid stenosis   Stroke [facility])     Past Surgical History:  Procedure Laterality Date   CAROTID ENDARTERECTOMY  1999   COLONOSCOPY  2014   negative;Dr Jarold Motto   COLONOSCOPY W/ POLYPECTOMY  2009   Dr Jarold Motto   CORONARY ARTERY BYPASS GRAFT  Aug.1999   HEMORRHOID BANDING     INTRACAPSULAR CATARACT EXTRACTION Bilateral 2018   NASAL SINUS SURGERY       Medications Prior to Admission: Prior to Admission medications   Medication Sig Start Date End Date Taking? Authorizing Provider  ipratropium (ATROVENT) 0.06 % nasal spray Place 2-4 sprays into both nostrils every 8 (eight) hours as needed for rhinitis. 12/04/22  Yes [provider]  albuterol (VENTOLIN HFA) 108 (90 Base) MCG/ACT inhaler Inhale 2 puffs into the lungs every 6 (six) hours as needed for wheezing or shortness of breath. 04/19/22   Mannam, Colbert Coyer, MD  amitriptyline (ELAVIL) 10 MG tablet Take 1-2 tablets (10-20 mg total) by mouth at bedtime. 12/18/22   [name], MD  azithromycin (ZITHROMAX) 250 MG tablet Take two tabs the first day and then one tab daily for four days 12/25/22  [name], MD  cholecalciferol (VITAMIN D3) 25 MCG (1000 UNIT) tablet Take 1,000 Units by mouth daily.    [provider]  Continuous Blood Gluc Receiver (FREESTYLE LIBRE 14 DAY READER) DEVI UAD to check sugars.  E11.9 07/25/22   [name], MD  Continuous Blood Gluc Sensor (FREESTYLE LIBRE 14 DAY SENSOR) MISC UAD to check sugars.  E11.9 07/25/22   [name], MD  dapagliflozin propanediol ([name]) 10 MG TABS tablet Take 1 tablet (10 mg total) by mouth daily before breakfast. 12/21/22   Burns, [name], MD  famotidine (PEPCID) 40 MG tablet Take 1 tablet (40 mg total) by mouth daily. 08/03/21   Burns, [name], MD  glipiZIDE (GLUCOTROL) 5 MG tablet TAKE 2 TABLETS BY MOUTH WITH BREAKFAST AND 1 TABLET WITH DINNER. 07/10/22   Burns, [name], MD  HYDROcodone  bit-homatropine (HYCODAN) 5-1.5 MG/5ML syrup Take 5 mLs by mouth every 6 (six) hours as needed for cough. 10/31/21   Mannam, Colbert Coyer, MD  ipratropium-albuterol (DUONEB) 0.5-2.5 (3) MG/3ML SOLN Take 3 mLs by nebulization every 4 (four) hours as needed. 12/29/22   Mannam, Colbert Coyer, MD  levocetirizine (XYZAL) 5 MG tablet TAKE 1 TABLET BY MOUTH EVERY EVENING. 09/26/21   Burns, [name], MD  metFORMIN (GLUCOPHAGE-XR) 500 MG 24 hr tablet 2 tabs with breakfast, 1 tab with lunch, 2 tabs with dinner 07/25/22   [name], MD  metoprolol tartrate (LOPRESSOR) 50 MG tablet Take 1 tablet (50 mg total) by mouth 2 (two) times daily. 08/28/22   Rollene Rotunda, MD  Nintedanib (OFEV) 100 MG CAPS Take 1 capsule (100 mg total) by mouth 2 (two) times daily. 10/24/22   Mannam, Colbert Coyer, MD  nitroGLYCERIN (NITROSTAT) 0.4 MG SL tablet Place 1 tablet (0.4 mg total) under the tongue every 5 (five) minutes as needed for chest pain. 08/25/21 08/10/07  Rollene Rotunda, MD  Omega-3 Fatty Acids (FISH OIL PO) Take 1 tablet by mouth daily.    [provider]  omeprazole (PRILOSEC) 40 MG capsule TAKE 1 CAPSULE BY MOUTH 2 TIMES DAILY. 05/08/22   [name], MD  [facility] DELICA LANCETS 33G MISC TEST BLOOD SUGAR ONCE DAILY 01/23/18   [name], MD  [facility] VERIO test strip USE UP TO 4 TIMES A DAY AS DIRECTED 07/06/21   [name], MD  predniSONE (DELTASONE) 10 MG tablet Take 1 tablet (10 mg total) by mouth daily with breakfast. 12/29/22   Mannam, Colbert Coyer, MD  rivaroxaban (XARELTO) 20 MG TABS tablet Take 1 tablet (20 mg total) by mouth daily with supper. 08/28/22   Rollene Rotunda, MD  simvastatin (ZOCOR) 40 MG tablet Take 1 tablet (40 mg total) by mouth every evening. 08/28/22   Rollene Rotunda, MD     Allergies:    Allergies  Allergen Reactions   Amlodipine Besylate     Rash Because of a history of documented adverse serious drug reaction;Medi Alert bracelet  is recommended   Iodinated Contrast Media Rash     Rash  Because of a history of documented adverse serious drug reaction;Medi Alert bracelet  is recommended  Rash, Because of a history of documented adverse serious drug reaction;Medi Alert bracelet  is recommended    Social History:   Social History   Socioeconomic History   Marital status: Married    Spouse name: Darel Hong   Number of children: 2   Years of education: Not on file   Highest education level: Not on file  Occupational  History   Occupation: Airline pilot Manager-retired  Tobacco Use   Smoking status: Never   Smokeless tobacco: Never  Vaping Use   Vaping Use: Never used  Substance and Sexual Activity   Alcohol use: No    Alcohol/week: 0.0 standard drinks of alcohol   Drug use: No   Sexual activity: Yes  Other Topics Concern   Not on file  Social History Narrative   Not on file   Social Determinants of Health   Financial Resource Strain: Low Risk  (08/03/2022)   Overall Financial Resource Strain (CARDIA)    Difficulty of Paying Living Expenses: Not hard at all  Food Insecurity: No Food Insecurity (08/03/2022)   Hunger Vital Sign    Worried About Running Out of Food in the Last Year: Never true    Ran Out of Food in the Last Year: Never true  Transportation Needs: No Transportation Needs (08/03/2022)   PRAPARE - Administrator, Civil Service (Medical): No    Lack of Transportation (Non-Medical): No  Physical Activity: Inactive (08/03/2022)   Exercise Vital Sign    Days of Exercise per Week: 0 days    Minutes of Exercise per Session: 0 min  Stress: No Stress Concern Present (08/03/2022)   [employer] of Occupational Health - Occupational Stress Questionnaire    Feeling of Stress : Not at all  Social Connections: Socially Integrated (08/03/2022)   Social Connection and Isolation Panel [NHANES]    Frequency of Communication with Friends and Family: More than three times a week    Frequency of Social Gatherings with Friends and Family: More than  three times a week    Attends Religious Services: More than 4 times per year    Active Member of [employer] or Organizations: No    Attends Banker Meetings: 1 to 4 times per year    Marital Status: Married  Catering manager Violence: Not At Risk (08/03/2022)   Humiliation, Afraid, Rape, and Kick questionnaire    Fear of Current or Ex-Partner: No    Emotionally Abused: No    Physically Abused: No    Sexually Abused: No    Family History:  The patient's family history includes Asthma in his sister; Colon cancer (age of onset: 50) in his brother; Diabetes in his father; Heart attack (age of onset: 92) in his brother; Heart attack (age of onset: 81) in his brother and mother; Hemochromatosis in his brother; Prostate cancer in his brother; Stroke (age of onset: 16) in his father. There is no history of Esophageal cancer, Rectal cancer, or Stomach cancer.    ROS:  Please see the history of present illness.  All other ROS reviewed and negative.     Physical Exam/Data:   Vitals:   01/07/23 1905 01/07/23 1906 01/07/23 2115 01/07/23 2130  BP: 128/78  (!) 135/95 (!) 141/107  Pulse: 75  67 82  Resp: 15  (!) 22 19  Temp: (!) 97.5 F (36.4 C)     TempSrc: Oral     SpO2: 94%  96% 93%  Weight:  72.1 kg    Height:  5\' 8"  (1.727 m)     No intake or output data in the 24 hours ending 01/07/23 2301    01/07/2023    7:06 PM 12/29/2022    1:04 PM 12/18/2022    2:50 PM  Last 3 Weights  Weight (lbs) 159 lb 159 lb 3.2 oz 160 lb  Weight (kg) 72.122 kg 72.213 kg 72.576  kg     Body mass index is 24.18 kg/m.  General:  Well nourished, well developed, in no acute distress HEENT: normal Neck: no JVD Vascular: No carotid bruits; Distal pulses 2+ bilaterally   Cardiac: Irregular tachycardic Lungs:  Coarse BS; poor airflow Abd: soft, nontender, no hepatomegaly  Ext: no edema Musculoskeletal:  No deformities, BUE and BLE strength normal and equal Skin: warm and dry  Neuro:  CNs 2-12 intact,  no focal abnormalities noted Psych:  Normal affect    EKG:  The ECG that was done  was personally reviewed and demonstrates Afib RVR with TWI   Laboratory Data:  High Sensitivity Troponin:   Recent Labs  Lab 01/07/23 1950 01/07/23 2145  TROPONINIHS 3,228* 5,977*      Chemistry Recent Labs  Lab 01/07/23 1950  NA 140  K 4.2  CL 105  CO2 24  GLUCOSE 276*  BUN 22  CREATININE 1.08  CALCIUM 9.3  MG 1.6*  GFRNONAA >60  ANIONGAP 11    No results for input(s): "PROT", "ALBUMIN", "AST", "ALT", "ALKPHOS", "BILITOT" in the last 168 hours. Lipids No results for input(s): "CHOL", "TRIG", "HDL", "LABVLDL", "LDLCALC", "CHOLHDL" in the last 168 hours. Hematology Recent Labs  Lab 01/07/23 1950  WBC 8.6  RBC 3.57*  HGB 12.4*  HCT 37.7*  MCV 105.6*  MCH 34.7*  MCHC 32.9  RDW 16.3*  PLT 181   Thyroid No results for input(s): "TSH", "FREET4" in the last 168 hours. BNP Recent Labs  Lab 01/07/23 2145  BNP 790.3*    DDimer No results for input(s): "DDIMER" in the last 168 hours.   Radiology/Studies:  DG Chest 2 View  Result Date: 01/07/2023 CLINICAL DATA:  Chest pain EXAM: CHEST - 2 VIEW COMPARISON:  12/18/2022 FINDINGS: Low lung volumes. Stable cardiomediastinal silhouette. Sternotomy and CABG. Diffuse bilateral predominantly interstitial opacities greatest in the left mid and lower lung, unchanged from prior. No pleural effusion or pneumothorax. No displaced rib fractures. IMPRESSION: No change from 12/18/2022. Left-greater-than-right interstitial opacities compatible with known pulmonary fibrosis. Superimposed infection or edema are not excluded. Electronically Signed   By: Minerva Fester M.D.   On: 01/07/2023 20:22     Assessment and Plan:   # Afib RVR # NSTEMI # CAD s/p CABG in 1999 # T2DM # HTN # Pulmonary fibrosis  -Patient coming with chest pressure since yesterday. Found to be in AFIB RVR- with rate control; the pain has significantly improved -Troponin  significantly elevated; 3228---5977 concerning for NSTEMI -Aspirin 325 mg given by EMS; 81 mg from tomorrow -LHC in AM -NPO after midnight -Echo in AM -Atorvastatin 80 mg -Heparin IV drip- hold xarelto till then.  -Started on dilt drip by ED for Afib RVR- rates much better now. Continue with it for now.  -Telemetry -Mg=1.6; replete Mg -Insulin per protocol for T2DM -Continue home medications for pulmonary fibrosis.   For questions or updates, please contact [facility] Please consult [email or web address] for contact info under     Signed, Hermelinda Dellen, MD  01/07/2023 11:01 PM

## 2023-01-07 NOTE — Progress Notes (Signed)
ANTICOAGULATION CONSULT NOTE - Initial Consult  Pharmacy Consult for heparin Indication: chest pain/ACS  Allergies  Allergen Reactions   Amlodipine Besylate     Rash Because of a history of documented adverse serious drug reaction;Medi Alert bracelet  is recommended   Iodinated Contrast Media Rash    Rash  Because of a history of documented adverse serious drug reaction;Medi Alert bracelet  is recommended  Rash, Because of a history of documented adverse serious drug reaction;Medi Alert bracelet  is recommended    Patient Measurements: Height: 5\' 8"  (172.7 cm) Weight: 72.1 kg (159 lb) IBW/kg (Calculated) : 68.4 Heparin Dosing Weight: TBW  Vital Signs: Temp: 97.5 F (36.4 C) (05/19 1905) Temp Source: Oral (05/19 1905) BP: 141/107 (05/19 2130) Pulse Rate: 82 (05/19 2130)  Labs: Recent Labs    01/07/23 1950  HGB 12.4*  HCT 37.7*  PLT 181  CREATININE 1.08  TROPONINIHS 3,228*    Estimated Creatinine Clearance: 53.7 mL/min (by C-G formula based on SCr of 1.08 mg/dL).   Medical History: Past Medical History:  Diagnosis Date   Allergic rhinitis    Asthma    Atrial fibrillation with rapid ventricular response (HCC)    a. newly diagnosed 11/03/13, spont conv to NSR, placed on xarelto.   CAD (coronary artery disease)    a. 1999; s/p CABG: LIMA to LAD, SVG to OM1, SVG to OM2, SVG to RCA  b. Normal nuc 10/2013 (done because of new onset AF.)   COVID    Diabetes mellitus (HCC)    HLD (hyperlipidemia)    HTN (hypertension)    Nephrolithiasis    Overweight(278.02)    PVD (peripheral vascular disease) (HCC)    Carotid stenosis   Stroke [facility])     Assessment: 8 YOM presenting with CP, elevated troponin, hx of afib on Xarelto PTA with last dose taken 5/19 AM.  Goal of Therapy:  Heparin level 0.3-0.7 units/ml aPTT 66-102 seconds Monitor platelets by anticoagulation protocol: Yes   Plan:  Heparin gtt at 900 units/hr, no bolus F/u 8 hour aPTT/HL F/u cards eval and  recs  Daylene Posey, PharmD, [facility] Clinical Pharmacist ED Pharmacist Phone # [phone] 01/07/2023 9:52 PM

## 2023-01-07 NOTE — ED Triage Notes (Signed)
Pt BIB [facility] from the [facility]. Pt was on his way to church went he started having CP and drove to the fire station. CP started at 9am yesterday and started back up again today. Pt has hx of A-fib and takes xarelto.   EMS VS BP 136/90 P 148 (before cardizem).... 80-120 after GCS 15 O2 97% RA

## 2023-01-08 ENCOUNTER — Inpatient Hospital Stay (HOSPITAL_COMMUNITY)
Admission: EM | Disposition: A | Discharge status: Home / Self Care | Payer: Self-pay | Source: Home / Self Care | Attending: Internal Medicine

## 2023-01-08 ENCOUNTER — Inpatient Hospital Stay (HOSPITAL_COMMUNITY): Payer: PPO

## 2023-01-08 DIAGNOSIS — R9431 Abnormal electrocardiogram [ECG] [EKG]: Secondary | ICD-10-CM | POA: Diagnosis not present

## 2023-01-08 DIAGNOSIS — I2510 Atherosclerotic heart disease of native coronary artery without angina pectoris: Secondary | ICD-10-CM | POA: Diagnosis not present

## 2023-01-08 DIAGNOSIS — I214 Non-ST elevation (NSTEMI) myocardial infarction: Secondary | ICD-10-CM | POA: Diagnosis not present

## 2023-01-08 DIAGNOSIS — I480 Paroxysmal atrial fibrillation: Secondary | ICD-10-CM | POA: Diagnosis not present

## 2023-01-08 HISTORY — PX: LEFT HEART CATH AND CORS/GRAFTS ANGIOGRAPHY: CATH118250

## 2023-01-08 LAB — CBG MONITORING, ED
Glucose-Capillary: 145 mg/dL — ABNORMAL HIGH (ref 70–99)
Glucose-Capillary: 139 mg/dL — ABNORMAL HIGH (ref 70–99)

## 2023-01-08 LAB — CBC
HCT: 36.6 % — ABNORMAL LOW (ref 39.0–52.0)
Hemoglobin: 12.3 g/dL — ABNORMAL LOW (ref 13.0–17.0)
MCH: 34.6 pg — ABNORMAL HIGH (ref 26.0–34.0)
MCHC: 33.6 g/dL (ref 30.0–36.0)
MCV: 103.1 fL — ABNORMAL HIGH (ref 80.0–100.0)
Platelets: 160 10*3/uL (ref 150–400)
RBC: 3.55 MIL/uL — ABNORMAL LOW (ref 4.22–5.81)
RDW: 16.2 % — ABNORMAL HIGH (ref 11.5–15.5)
WBC: 8.6 10*3/uL (ref 4.0–10.5)
nRBC: 0 % (ref 0.0–0.2)

## 2023-01-08 LAB — ECHOCARDIOGRAM COMPLETE
AR max vel: 1.8 cm2
AV Peak grad: 6.8 mmHg
Ao pk vel: 1.3 m/s
Area-P 1/2: 5.13 cm2
Height: 68 in
S' Lateral: 2.5 cm
Weight: 2544 oz

## 2023-01-08 LAB — PROTIME-INR
INR: 1.2 (ref 0.8–1.2)
Prothrombin Time: 15.3 seconds — ABNORMAL HIGH (ref 11.4–15.2)

## 2023-01-08 LAB — LIPID PANEL
Cholesterol: 125 mg/dL (ref 0–200)
HDL: 36 mg/dL — ABNORMAL LOW (ref >40)
LDL Cholesterol: 59 mg/dL (ref 0–99)
Total CHOL/HDL Ratio: 3.5 RATIO
Triglycerides: 152 mg/dL — ABNORMAL HIGH (ref <150)
VLDL: 30 mg/dL (ref 0–40)

## 2023-01-08 LAB — BASIC METABOLIC PANEL
Anion gap: 12 (ref 5–15)
BUN: 17 mg/dL (ref 8–23)
CO2: 23 mmol/L (ref 22–32)
Calcium: 8.7 mg/dL — ABNORMAL LOW (ref 8.9–10.3)
Chloride: 102 mmol/L (ref 98–111)
Creatinine, Ser: 0.77 mg/dL (ref 0.61–1.24)
GFR, Estimated: >60 mL/min (ref >60)
Glucose, Bld: 141 mg/dL — ABNORMAL HIGH (ref 70–99)
Potassium: 3.7 mmol/L (ref 3.5–5.1)
Sodium: 137 mmol/L (ref 135–145)

## 2023-01-08 LAB — HEPARIN LEVEL (UNFRACTIONATED): Heparin Unfractionated: 0.87 IU/mL — ABNORMAL HIGH (ref 0.30–0.70)

## 2023-01-08 LAB — APTT
aPTT: 43 seconds — ABNORMAL HIGH (ref 24–36)
aPTT: 46 seconds — ABNORMAL HIGH (ref 24–36)

## 2023-01-08 LAB — GLUCOSE, CAPILLARY: Glucose-Capillary: 171 mg/dL — ABNORMAL HIGH (ref 70–99)

## 2023-01-08 SURGERY — LEFT HEART CATH AND CORS/GRAFTS ANGIOGRAPHY
Anesthesia: LOCAL

## 2023-01-08 MED ORDER — LIDOCAINE HCL (PF) 1 % IJ SOLN
INTRAMUSCULAR | Status: AC
  Filled 2023-01-08: qty 30

## 2023-01-08 MED ORDER — FENTANYL CITRATE (PF) 100 MCG/2ML IJ SOLN
INTRAMUSCULAR | Status: DC | PRN
  Administered 2023-01-08: 25 ug via INTRAVENOUS

## 2023-01-08 MED ORDER — SODIUM CHLORIDE 0.9 % WEIGHT BASED INFUSION: 3.0000 mL/kg/h | INTRAVENOUS | Status: DC

## 2023-01-08 MED ORDER — PREDNISONE 10 MG PO TABS
10.0000 mg | ORAL_TABLET | Freq: Every day | ORAL | Status: DC
  Administered 2023-01-09: 10 mg via ORAL
  Filled 2023-01-08: qty 1

## 2023-01-08 MED ORDER — VERAPAMIL HCL 2.5 MG/ML IV SOLN
INTRAVENOUS | Status: AC
  Filled 2023-01-08: qty 2

## 2023-01-08 MED ORDER — MIDAZOLAM HCL 2 MG/2ML IJ SOLN
INTRAMUSCULAR | Status: DC | PRN
  Administered 2023-01-08: 1 mg via INTRAVENOUS

## 2023-01-08 MED ORDER — IPRATROPIUM BROMIDE 0.06 % NA SOLN: 2.0000 | Freq: Three times a day (TID) | NASAL | Status: DC | PRN

## 2023-01-08 MED ORDER — FAMOTIDINE 20 MG PO TABS
40.0000 mg | ORAL_TABLET | Freq: Every day | ORAL | Status: DC
  Administered 2023-01-08 – 2023-01-09 (×2): 40 mg via ORAL
  Filled 2023-01-08 (×2): qty 2

## 2023-01-08 MED ORDER — METHYLPREDNISOLONE SODIUM SUCC 125 MG IJ SOLR
125.0000 mg | Freq: Once | INTRAMUSCULAR | Status: AC
  Administered 2023-01-08: 125 mg via INTRAVENOUS
  Filled 2023-01-08: qty 2

## 2023-01-08 MED ORDER — LEVOCETIRIZINE DIHYDROCHLORIDE 5 MG PO TABS: 5.0000 mg | ORAL_TABLET | Freq: Every evening | ORAL | Status: DC

## 2023-01-08 MED ORDER — HEPARIN (PORCINE) 25000 UT/250ML-% IV SOLN
1100.0000 [IU]/h | INTRAVENOUS | Status: DC
  Administered 2023-01-08: 1100 [IU]/h via INTRAVENOUS
  Filled 2023-01-08: qty 250

## 2023-01-08 MED ORDER — POTASSIUM CHLORIDE CRYS ER 20 MEQ PO TBCR
30.0000 meq | EXTENDED_RELEASE_TABLET | Freq: Once | ORAL | Status: AC
  Administered 2023-01-08: 30 meq via ORAL
  Filled 2023-01-08: qty 1

## 2023-01-08 MED ORDER — HEPARIN SODIUM (PORCINE) 1000 UNIT/ML IJ SOLN
INTRAMUSCULAR | Status: AC
  Filled 2023-01-08: qty 10

## 2023-01-08 MED ORDER — LIDOCAINE HCL (PF) 1 % IJ SOLN
INTRAMUSCULAR | Status: DC | PRN
  Administered 2023-01-08: 10 mL via INTRADERMAL

## 2023-01-08 MED ORDER — SODIUM CHLORIDE 0.9 % WEIGHT BASED INFUSION: 1.0000 mL/kg/h | INTRAVENOUS | Status: AC

## 2023-01-08 MED ORDER — SODIUM CHLORIDE 0.9 % WEIGHT BASED INFUSION: 1.0000 mL/kg/h | INTRAVENOUS | Status: DC

## 2023-01-08 MED ORDER — PANTOPRAZOLE SODIUM 40 MG PO TBEC
40.0000 mg | DELAYED_RELEASE_TABLET | Freq: Every day | ORAL | Status: DC
  Administered 2023-01-08 – 2023-01-09 (×2): 40 mg via ORAL
  Filled 2023-01-08 (×2): qty 1

## 2023-01-08 MED ORDER — HYDROCODONE BIT-HOMATROP MBR 5-1.5 MG/5ML PO SOLN: 5.0000 mL | Freq: Four times a day (QID) | ORAL | Status: DC | PRN

## 2023-01-08 MED ORDER — HEPARIN (PORCINE) IN NACL 1000-0.9 UT/500ML-% IV SOLN
INTRAVENOUS | Status: DC | PRN
  Administered 2023-01-08 (×2): 500 mL

## 2023-01-08 MED ORDER — ALBUTEROL SULFATE (2.5 MG/3ML) 0.083% IN NEBU: 2.5000 mg | INHALATION_SOLUTION | Freq: Four times a day (QID) | RESPIRATORY_TRACT | Status: DC | PRN

## 2023-01-08 MED ORDER — MIDAZOLAM HCL 2 MG/2ML IJ SOLN
INTRAMUSCULAR | Status: AC
  Filled 2023-01-08: qty 2

## 2023-01-08 MED ORDER — SODIUM CHLORIDE 0.9 % WEIGHT BASED INFUSION: 3.0000 mL/kg/h | INTRAVENOUS | Status: AC

## 2023-01-08 MED ORDER — NINTEDANIB ESYLATE 100 MG PO CAPS: 100.0000 mg | ORAL_CAPSULE | Freq: Two times a day (BID) | ORAL | Status: DC

## 2023-01-08 MED ORDER — DIPHENHYDRAMINE HCL 50 MG/ML IJ SOLN
25.0000 mg | Freq: Once | INTRAMUSCULAR | Status: AC
  Administered 2023-01-08: 25 mg via INTRAVENOUS
  Filled 2023-01-08: qty 1

## 2023-01-08 MED ORDER — IODIXANOL 320 MG/ML IV SOLN
INTRAVENOUS | Status: DC | PRN
  Administered 2023-01-08: 75 mL

## 2023-01-08 MED ORDER — AMITRIPTYLINE HCL 10 MG PO TABS
10.0000 mg | ORAL_TABLET | Freq: Every day | ORAL | Status: DC
  Administered 2023-01-08: 10 mg via ORAL
  Filled 2023-01-08 (×2): qty 1

## 2023-01-08 MED ORDER — FENTANYL CITRATE (PF) 100 MCG/2ML IJ SOLN
INTRAMUSCULAR | Status: AC
  Filled 2023-01-08: qty 2

## 2023-01-08 SURGICAL SUPPLY — 12 items
CATH INFINITI 5FR MULTPACK ANG (CATHETERS) IMPLANT
CLOSURE MYNX CONTROL 5F (Vascular Products) IMPLANT
GLIDESHEATH SLEND SS 6F .021 (SHEATH) IMPLANT
GUIDEWIRE INQWIRE 1.5J.035X260 (WIRE) IMPLANT
INQWIRE 1.5J .035X260CM (WIRE)
KIT HEART LEFT (KITS) ×1 IMPLANT
PACK CARDIAC CATHETERIZATION (CUSTOM PROCEDURE TRAY) ×1 IMPLANT
SHEATH PINNACLE 5F 10CM (SHEATH) IMPLANT
TRANSDUCER W/STOPCOCK (MISCELLANEOUS) ×1 IMPLANT
TUBING CIL FLEX 10 FLL-RA (TUBING) ×1 IMPLANT
WIRE EMERALD 3MM-J .035X150CM (WIRE) IMPLANT
WIRE MICRO SET SILHO 5FR 7 (SHEATH) IMPLANT

## 2023-01-08 NOTE — Interval H&P Note (Signed)
History and Physical Interval Note:  01/08/2023 2:48 PM  Dola Argyle Pinard  has presented today for surgery, with the diagnosis of NSTEMI.  The various methods of treatment have been discussed with the patient and family. After consideration of risks, benefits and other options for treatment, the patient has consented to  Procedure(s): LEFT HEART CATH AND CORS/GRAFTS ANGIOGRAPHY (N/A) as a surgical intervention.  The patient's history has been reviewed, patient examined, no change in status, stable for surgery.  I have reviewed the patient's chart and labs.  Questions were answered to the patient's satisfaction.   Cath Lab Visit (complete for each Cath Lab visit)  Clinical Evaluation Leading to the Procedure:   ACS: Yes.    Non-ACS:    Anginal Classification: CCS III  Anti-ischemic medical therapy: Minimal Therapy (1 class of medications)  Non-Invasive Test Results: No non-invasive testing performed  Prior CABG: Previous CABG        Theron Arista [facility] 01/08/2023 2:48 PM

## 2023-01-08 NOTE — Progress Notes (Addendum)
 Rounding Note    Patient Name: Todd [name] Date of Encounter: 01/08/2023  [facility] Cardiologist: Tavio Hochrein, MD   Subjective   Patient resting comfortably in the ED this morning. Denies chest pain, palpitations, shortness of breath. He has a chronic cough (known fibrosis) and says that this has been persistent since COVID.   Inpatient Medications    Scheduled Meds:  aspirin EC  81 mg Oral Daily   atorvastatin  80 mg Oral Daily   insulin aspart  0-9 Units Subcutaneous TID WC   Continuous Infusions:  diltiazem (CARDIZEM) infusion 12 mg/hr (01/08/23 0755)   heparin 900 Units/hr (01/07/23 2358)   PRN Meds: acetaminophen, nitroGLYCERIN, ondansetron (ZOFRAN) IV   Vital Signs    Vitals:   01/08/23 0430 01/08/23 0727 01/08/23 0730 01/08/23 0755  BP: 113/76  (!) 119/92   Pulse: (!) 50  (!) 105 (!) 141  Resp: (!) 34  (!) 24 (!) 24  Temp:  97.7 F (36.5 C)    TempSrc:  Oral    SpO2: 93%  95%   Weight:      Height:        Intake/Output Summary (Last 24 hours) at 01/08/2023 0759 Last data filed at 01/08/2023 0758 Gross per 24 hour  Intake 93.03 ml  Output 650 ml  Net -556.97 ml      01/07/2023    7:06 PM 12/29/2022    1:04 PM 12/18/2022    2:50 PM  Last 3 Weights  Weight (lbs) 159 lb 159 lb 3.2 oz 160 lb  Weight (kg) 72.122 kg 72.213 kg 72.576 kg      Telemetry    Primarily atrial fibrillation with generally controlled ventricular rates, under 120bpm. Appears to have gone back to sinus briefly around midnight - Personally Reviewed  ECG    Midnight ECG with sinus appearing rhythm. No acute ischemic changes - Personally Reviewed  Physical Exam   GEN: No acute distress.   Neck: No JVD Cardiac: irregularly irregular, no murmurs, rubs, or gallops.  Respiratory: upper lobe crackles, left greater than right GI: Soft, nontender, non-distended  MS: trace lower extremity edema bilaterally Neuro:  Nonfocal  Psych: Normal affect   Labs    High  Sensitivity Troponin:   Recent Labs  Lab 01/07/23 1950 01/07/23 2145  TROPONINIHS 3,228* 5,977*     Chemistry Recent Labs  Lab 01/07/23 1950 01/08/23 0352  NA 140 137  K 4.2 3.7  CL 105 102  CO2 24 23  GLUCOSE 276* 141*  BUN 22 17  CREATININE 1.08 0.77  CALCIUM 9.3 8.7*  MG 1.6*  --   GFRNONAA >60 >60  ANIONGAP 11 12    Lipids  Recent Labs  Lab 01/08/23 0352  CHOL 125  TRIG 152*  HDL 36*  LDLCALC 59  CHOLHDL 3.5    Hematology Recent Labs  Lab 01/07/23 1950 01/08/23 0352  WBC 8.6 8.6  RBC 3.57* 3.55*  HGB 12.4* 12.3*  HCT 37.7* 36.6*  MCV 105.6* 103.1*  MCH 34.7* 34.6*  MCHC 32.9 33.6  RDW 16.3* 16.2*  PLT 181 160   Thyroid No results for input(s): "TSH", "FREET4" in the last 168 hours.  BNP Recent Labs  Lab 01/07/23 2145  BNP 790.3*    DDimer No results for input(s): "DDIMER" in the last 168 hours.   Radiology    DG Chest 2 View  Result Date: 01/07/2023 CLINICAL DATA:  Chest pain EXAM: CHEST - 2 VIEW COMPARISON:  12/18/2022   FINDINGS: Low lung volumes. Stable cardiomediastinal silhouette. Sternotomy and CABG. Diffuse bilateral predominantly interstitial opacities greatest in the left mid and lower lung, unchanged from prior. No pleural effusion or pneumothorax. No displaced rib fractures. IMPRESSION: No change from 12/18/2022. Left-greater-than-right interstitial opacities compatible with known pulmonary fibrosis. Superimposed infection or edema are not excluded. Electronically Signed   By: Tyler  Stutzman M.D.   On: 01/07/2023 20:22    Cardiac Studies   11/10/2013 TTE  Study Conclusions   - Left ventricle: The cavity size was normal. There was mild    concentric hypertrophy. Systolic function was normal. The    estimated ejection fraction was in the range of 55% to    60%. Wall motion was normal; there were no regional wall    motion abnormalities. Doppler parameters are consistent    with abnormal left ventricular relaxation (grade 1     diastolic dysfunction). Doppler parameters are consistent    with elevated ventricular end-diastolic filling pressure.  - Aortic valve: Mildly thickened leaflets. No significant    regurgitation.  - Ascending aorta: The ascending aorta was normal in size.  - Mitral valve: No significant regurgitation.  - Right ventricle: The cavity size was mildly dilated.    Systolic function was low normal.  - Atrial septum: No defect or patent foramen ovale was    identified.  - Pulmonic valve: No significant regurgitation.  - Pericardium, extracardiac: There was no pericardial    effusion.   Patient Profile     Todd [name] is a 82 y.o. male with pmh sx for CAD s/p CABG in 1999, pAF, HTN, T2DM and pulmonary fibrosis seen for the evaluation of NSTEMI and Afib RVR. Patient with chest pain in multiple episodes over the weekend, the last of which prompted him to drive to the local fire station for evaluation.  Assessment & Plan    NSTEMI CAD s/p CABG 1999  Patient with troponin 3228->5977 in setting of unstable angina. Last myoview in 2015 without evidence of ischemia or infarct. Although this could be demand ischemia with afib, given CAD hx and significant troponin delta, plan for ischemic evaluation today.  LHC today TTE pending Continue heparin Daily ASA 81mg  Atorvastatin 80mg (previously on Simvastatin 40mg)  Shared Decision Making/Informed Consent The risks [stroke (1 in 1000), death (1 in 1000), kidney failure [usually temporary] (1 in 500), bleeding (1 in 200), allergic reaction [possibly serious] (1 in 200)], benefits (diagnostic support and management of coronary artery disease) and alternatives of a cardiac catheterization were discussed in detail with Todd [name] and he is willing to proceed.   Paroxysmal atrial fibrillation with RVR  Patient found to be in afib on presentation to fire station yesterday. On Xarelto, Metoprolol Tartrate 50mg BID at home.  Rates now generally  controlled on Diltiazem infusion. With plans for LHC today, will continue. Will tentatively plan to convert back to beta blocker since patient previously well managed with this Resume Xarelto when cleared post-cath Would need to consider DCCV if patient remains in afib.  Pulmonary fibrosis Chronic cough  Patient reports chronic cough since he had COVID. CXR this admission shows interstitial opacities compatible with pulmonary fibrosis. Physical exam with upper lobe crackles L>R.   Abnormal lung sounds on physical exam today more consistent with fibrosis than CHF. Suspect BNP 790.3 largely a result of atrial fibrillation. Will follow EF on echo. Continue home pulmonary regimen  DM type II  SSI while inpatient.          For questions or updates, please contact [facility] Please consult [email or web address] for contact info under        Signed, Evan Williams, PA-C  01/08/2023, 7:59 AM     Agree with note by Evan Williams, PA-C  87-year-old male patient of Dr. Hochrein's with history of remote CABG and PAF.  He was admitted with chest pain, A-fib with RVR and a troponin that went up to 6000.  He is currently rate controlled with IV diltiazem and heparin.  His Xarelto has been held.  He said no recurrent chest pain.  His exam is benign.  Plan for coronary angiography this morning.  Jennifer Holland J. Earlyn Sylvan, M.D., FACP, FACC, FAHA, FSCAI [facility] [address] [place], [place]  [id number]  [phone] 01/08/2023 8:51 AM  

## 2023-01-08 NOTE — H&P (View-Only) (Signed)
Rounding Note    Patient Name: Todd [name] Date of Encounter: 01/08/2023  [facility] Cardiologist: Rollene Rotunda, MD   Subjective   Patient resting comfortably in the ED this morning. Denies chest pain, palpitations, shortness of breath. He has a chronic cough (known fibrosis) and says that this has been persistent since COVID.   Inpatient Medications    Scheduled Meds:  aspirin EC  81 mg Oral Daily   atorvastatin  80 mg Oral Daily   insulin aspart  0-9 Units Subcutaneous TID WC   Continuous Infusions:  diltiazem (CARDIZEM) infusion 12 mg/hr (01/08/23 0755)   heparin 900 Units/hr (01/07/23 2358)   PRN Meds: acetaminophen, nitroGLYCERIN, ondansetron (ZOFRAN) IV   Vital Signs    Vitals:   01/08/23 0430 01/08/23 0727 01/08/23 0730 01/08/23 0755  BP: 113/76  (!) 119/92   Pulse: (!) 50  (!) 105 (!) 141  Resp: (!) 34  (!) 24 (!) 24  Temp:  97.7 F (36.5 C)    TempSrc:  Oral    SpO2: 93%  95%   Weight:      Height:        Intake/Output Summary (Last 24 hours) at 01/08/2023 0759 Last data filed at 01/08/2023 0758 Gross per 24 hour  Intake 93.03 ml  Output 650 ml  Net -556.97 ml      01/07/2023    7:06 PM 12/29/2022    1:04 PM 12/18/2022    2:50 PM  Last 3 Weights  Weight (lbs) 159 lb 159 lb 3.2 oz 160 lb  Weight (kg) 72.122 kg 72.213 kg 72.576 kg      Telemetry    Primarily atrial fibrillation with generally controlled ventricular rates, under 120bpm. Appears to have gone back to sinus briefly around midnight - Personally Reviewed  ECG    Midnight ECG with sinus appearing rhythm. No acute ischemic changes - Personally Reviewed  Physical Exam   GEN: No acute distress.   Neck: No JVD Cardiac: irregularly irregular, no murmurs, rubs, or gallops.  Respiratory: upper lobe crackles, left greater than right GI: Soft, nontender, non-distended  MS: trace lower extremity edema bilaterally Neuro:  Nonfocal  Psych: Normal affect   Labs    High  Sensitivity Troponin:   Recent Labs  Lab 01/07/23 1950 01/07/23 2145  TROPONINIHS 3,228* 5,977*     Chemistry Recent Labs  Lab 01/07/23 1950 01/08/23 0352  NA 140 137  K 4.2 3.7  CL 105 102  CO2 24 23  GLUCOSE 276* 141*  BUN 22 17  CREATININE 1.08 0.77  CALCIUM 9.3 8.7*  MG 1.6*  --   GFRNONAA >60 >60  ANIONGAP 11 12    Lipids  Recent Labs  Lab 01/08/23 0352  CHOL 125  TRIG 152*  HDL 36*  LDLCALC 59  CHOLHDL 3.5    Hematology Recent Labs  Lab 01/07/23 1950 01/08/23 0352  WBC 8.6 8.6  RBC 3.57* 3.55*  HGB 12.4* 12.3*  HCT 37.7* 36.6*  MCV 105.6* 103.1*  MCH 34.7* 34.6*  MCHC 32.9 33.6  RDW 16.3* 16.2*  PLT 181 160   Thyroid No results for input(s): "TSH", "FREET4" in the last 168 hours.  BNP Recent Labs  Lab 01/07/23 2145  BNP 790.3*    DDimer No results for input(s): "DDIMER" in the last 168 hours.   Radiology    DG Chest 2 View  Result Date: 01/07/2023 CLINICAL DATA:  Chest pain EXAM: CHEST - 2 VIEW COMPARISON:  12/18/2022  FINDINGS: Low lung volumes. Stable cardiomediastinal silhouette. Sternotomy and CABG. Diffuse bilateral predominantly interstitial opacities greatest in the left mid and lower lung, unchanged from prior. No pleural effusion or pneumothorax. No displaced rib fractures. IMPRESSION: No change from 12/18/2022. Left-greater-than-right interstitial opacities compatible with known pulmonary fibrosis. Superimposed infection or edema are not excluded. Electronically Signed   By: Minerva Fester M.D.   On: 01/07/2023 20:22    Cardiac Studies   11/10/2013 TTE  Study Conclusions   - Left ventricle: The cavity size was normal. There was mild    concentric hypertrophy. Systolic function was normal. The    estimated ejection fraction was in the range of 55% to    60%. Wall motion was normal; there were no regional wall    motion abnormalities. Doppler parameters are consistent    with abnormal left ventricular relaxation (grade 1     diastolic dysfunction). Doppler parameters are consistent    with elevated ventricular end-diastolic filling pressure.  - Aortic valve: Mildly thickened leaflets. No significant    regurgitation.  - Ascending aorta: The ascending aorta was normal in size.  - Mitral valve: No significant regurgitation.  - Right ventricle: The cavity size was mildly dilated.    Systolic function was low normal.  - Atrial septum: No defect or patent foramen ovale was    identified.  - Pulmonic valve: No significant regurgitation.  - Pericardium, extracardiac: There was no pericardial    effusion.   Patient Profile     Todd [name] is a 82 y.o. male with pmh sx for CAD s/p CABG in 1999, pAF, HTN, T2DM and pulmonary fibrosis seen for the evaluation of NSTEMI and Afib RVR. Patient with chest pain in multiple episodes over the weekend, the last of which prompted him to drive to the local fire station for evaluation.  Assessment & Plan    NSTEMI CAD s/p CABG 1999  Patient with troponin 3228->5977 in setting of unstable angina. Last myoview in 2015 without evidence of ischemia or infarct. Although this could be demand ischemia with afib, given CAD hx and significant troponin delta, plan for ischemic evaluation today.  LHC today TTE pending Continue heparin Daily ASA 81mg   Atorvastatin 80mg  (previously on Simvastatin 40mg )  Shared Decision Making/Informed Consent The risks [stroke (1 in 1000), death (1 in 1000), kidney failure [usually temporary] (1 in 500), bleeding (1 in 200), allergic reaction [possibly serious] (1 in 200)], benefits (diagnostic support and management of coronary artery disease) and alternatives of a cardiac catheterization were discussed in detail with Mr. Omari and he is willing to proceed.   Paroxysmal atrial fibrillation with RVR  Patient found to be in afib on presentation to fire station yesterday. On Xarelto, Metoprolol Tartrate 50mg  BID at home.  Rates now generally  controlled on Diltiazem infusion. With plans for [facility] today, will continue. Will tentatively plan to convert back to beta blocker since patient previously well managed with this Resume Xarelto when cleared post-cath Would need to consider DCCV if patient remains in afib.  Pulmonary fibrosis Chronic cough  Patient reports chronic cough since he had COVID. CXR this admission shows interstitial opacities compatible with pulmonary fibrosis. Physical exam with upper lobe crackles L>R.   Abnormal lung sounds on physical exam today more consistent with fibrosis than CHF. Suspect BNP 790.3 largely a result of atrial fibrillation. Will follow EF on echo. Continue home pulmonary regimen  DM type II  SSI while inpatient.  For questions or updates, please contact [facility] Please consult [email or web address] for contact info under        Signed, Perlie Gold, PA-C  01/08/2023, 7:59 AM     Agree with note by Perlie Gold, PA-C  82 year old male patient of Dr. Jenene Slicker with history of remote CABG and PAF.  He was admitted with chest pain, A-fib with RVR and a troponin that went up to 6000.  He is currently rate controlled with IV diltiazem and heparin.  His Xarelto has been held.  He said no recurrent chest pain.  His exam is benign.  Plan for coronary angiography this morning.  Runell Gess, M.D., FACP, [facility], [facility] [address] [place], [place]  [address]  [phone] 01/08/2023 8:51 AM

## 2023-01-08 NOTE — Progress Notes (Signed)
ANTICOAGULATION CONSULT NOTE - Follow Up Consult  Pharmacy Consult for heparin Indication: chest pain/ACS  Allergies  Allergen Reactions   Amlodipine Besylate     Rash Because of a history of documented adverse serious drug reaction;Medi Alert bracelet  is recommended   Iodinated Contrast Media Rash    Rash  Because of a history of documented adverse serious drug reaction;Medi Alert bracelet  is recommended  Rash, Because of a history of documented adverse serious drug reaction;Medi Alert bracelet  is recommended    Patient Measurements: Height: 5\' 8"  (172.7 cm) Weight: 70.5 kg (155 lb 6.8 oz) IBW/kg (Calculated) : 68.4 Heparin Dosing Weight: TBW  Vital Signs: Temp: 98 F (36.7 C) (05/20 1840) Temp Source: Oral (05/20 1840) BP: 152/92 (05/20 1840) Pulse Rate: 103 (05/20 1840)  Labs: Recent Labs    01/07/23 1950 01/07/23 2145 01/08/23 0352 01/08/23 0749  HGB 12.4*  --  12.3*  --   HCT 37.7*  --  36.6*  --   PLT 181  --  160  --   APTT  --   --  43* 46*  LABPROT  --   --  15.3*  --   INR  --   --  1.2  --   HEPARINUNFRC  --   --  0.87*  --   CREATININE 1.08  --  0.77  --   TROPONINIHS 3,228* 5,977*  --   --      Estimated Creatinine Clearance: 72.4 mL/min (by C-G formula based on SCr of 0.77 mg/dL).   Medical History: Past Medical History:  Diagnosis Date   Allergic rhinitis    Asthma    Atrial fibrillation with rapid ventricular response (HCC)    a. newly diagnosed 11/03/13, spont conv to NSR, placed on xarelto.   CAD (coronary artery disease)    a. 1999; s/p CABG: LIMA to LAD, SVG to OM1, SVG to OM2, SVG to RCA  b. Normal nuc 10/2013 (done because of new onset AF.)   COVID    Diabetes mellitus (HCC)    HLD (hyperlipidemia)    HTN (hypertension)    Nephrolithiasis    Overweight(278.02)    PVD (peripheral vascular disease) (HCC)    Carotid stenosis   Stroke [facility])     Assessment: 66 YOM presenting with CP, elevated troponin, hx of afib on Xarelto  PTA with last dose taken 5/19 AM.  aPTT subtherapeutic this AM at 46 seconds. Heparin drip was increased to 1100 units/hr. Patient went to cath prior to 8h levels being collected. Ok to resume heparin drip 6h post cath (ended ~1600) per cards consult.   Goal of Therapy:  Heparin level 0.3-0.7 units/ml aPTT 66-102 seconds Monitor platelets by anticoagulation protocol: Yes   Plan:  Resume heparin drip at 1100 units/hr tonight at 10pm F/u 8 hour aPTT/HL  Rexford Maus, PharmD, BCPS 01/08/2023 6:55 PM

## 2023-01-08 NOTE — ED Notes (Signed)
Patient transported to cath lab

## 2023-01-08 NOTE — Progress Notes (Signed)
Echocardiogram 2D Echocardiogram has been performed.  Todd [name] 01/08/2023, 9:24 AM

## 2023-01-08 NOTE — Progress Notes (Signed)
ANTICOAGULATION CONSULT NOTE - Follow Up Consult  Pharmacy Consult for heparin Indication: chest pain/ACS  Allergies  Allergen Reactions   Amlodipine Besylate     Rash Because of a history of documented adverse serious drug reaction;Medi Alert bracelet  is recommended   Iodinated Contrast Media Rash    Rash  Because of a history of documented adverse serious drug reaction;Medi Alert bracelet  is recommended  Rash, Because of a history of documented adverse serious drug reaction;Medi Alert bracelet  is recommended    Patient Measurements: Height: 5\' 8"  (172.7 cm) Weight: 72.1 kg (159 lb) IBW/kg (Calculated) : 68.4 Heparin Dosing Weight: TBW  Vital Signs: Temp: 98.2 F (36.8 C) (05/20 0811) Temp Source: Oral (05/20 0811) BP: 105/78 (05/20 0811) Pulse Rate: 124 (05/20 0811)  Labs: Recent Labs    01/07/23 1950 01/07/23 2145 01/08/23 0352 01/08/23 0749  HGB 12.4*  --  12.3*  --   HCT 37.7*  --  36.6*  --   PLT 181  --  160  --   APTT  --   --  43* 46*  LABPROT  --   --  15.3*  --   INR  --   --  1.2  --   HEPARINUNFRC  --   --  0.87*  --   CREATININE 1.08  --  0.77  --   TROPONINIHS 3,228* 5,977*  --   --      Estimated Creatinine Clearance: 72.4 mL/min (by C-G formula based on SCr of 0.77 mg/dL).   Medical History: Past Medical History:  Diagnosis Date   Allergic rhinitis    Asthma    Atrial fibrillation with rapid ventricular response (HCC)    a. newly diagnosed 11/03/13, spont conv to NSR, placed on xarelto.   CAD (coronary artery disease)    a. 1999; s/p CABG: LIMA to LAD, SVG to OM1, SVG to OM2, SVG to RCA  b. Normal nuc 10/2013 (done because of new onset AF.)   COVID    Diabetes mellitus (HCC)    HLD (hyperlipidemia)    HTN (hypertension)    Nephrolithiasis    Overweight(278.02)    PVD (peripheral vascular disease) (HCC)    Carotid stenosis   Stroke [facility])     Assessment: 87 YOM presenting with CP, elevated troponin, hx of afib on Xarelto PTA  with last dose taken 5/19 AM.  aPTT subtherapeutic this AM at 46 seconds. Will increase by 200 units/hr and recheck aPTT. No bolus given recent DOAC use.   Goal of Therapy:  Heparin level 0.3-0.7 units/ml aPTT 66-102 seconds Monitor platelets by anticoagulation protocol: Yes   Plan:  Increase heparin gtt to 1100 units/hr, no bolus F/u 8 hour aPTT/HL F/u cards eval and recs  Blane Ohara, PharmD  PGY1 Pharmacy Resident

## 2023-01-09 ENCOUNTER — Encounter (HOSPITAL_COMMUNITY): Payer: Self-pay | Admitting: Cardiology

## 2023-01-09 ENCOUNTER — Telehealth: Payer: Self-pay | Admitting: Cardiology

## 2023-01-09 ENCOUNTER — Other Ambulatory Visit (HOSPITAL_COMMUNITY): Payer: Self-pay

## 2023-01-09 DIAGNOSIS — I214 Non-ST elevation (NSTEMI) myocardial infarction: Secondary | ICD-10-CM | POA: Diagnosis not present

## 2023-01-09 LAB — APTT: aPTT: 69 seconds — ABNORMAL HIGH (ref 24–36)

## 2023-01-09 LAB — CBC
HCT: 34.6 % — ABNORMAL LOW (ref 39.0–52.0)
Hemoglobin: 11.8 g/dL — ABNORMAL LOW (ref 13.0–17.0)
MCH: 34.6 pg — ABNORMAL HIGH (ref 26.0–34.0)
MCHC: 34.1 g/dL (ref 30.0–36.0)
MCV: 101.5 fL — ABNORMAL HIGH (ref 80.0–100.0)
Platelets: 150 10*3/uL (ref 150–400)
RBC: 3.41 MIL/uL — ABNORMAL LOW (ref 4.22–5.81)
RDW: 16.2 % — ABNORMAL HIGH (ref 11.5–15.5)
WBC: 6.9 10*3/uL (ref 4.0–10.5)
nRBC: 0.3 % — ABNORMAL HIGH (ref 0.0–0.2)

## 2023-01-09 LAB — HEPARIN LEVEL (UNFRACTIONATED): Heparin Unfractionated: 0.78 IU/mL — ABNORMAL HIGH (ref 0.30–0.70)

## 2023-01-09 LAB — GLUCOSE, CAPILLARY: Glucose-Capillary: 218 mg/dL — ABNORMAL HIGH (ref 70–99)

## 2023-01-09 MED ORDER — METFORMIN HCL ER 500 MG PO TB24: ORAL_TABLET | ORAL | 0 refills | Status: DC

## 2023-01-09 MED ORDER — ATORVASTATIN CALCIUM 80 MG PO TABS
80.0000 mg | ORAL_TABLET | Freq: Every day | ORAL | 3 refills | Status: DC
  Filled 2023-01-09: qty 90, 90d supply, fill #0

## 2023-01-09 MED ORDER — OMEPRAZOLE 40 MG PO CPDR: 40.0000 mg | DELAYED_RELEASE_CAPSULE | Freq: Every day | ORAL | Status: DC

## 2023-01-09 MED ORDER — AMITRIPTYLINE HCL 10 MG PO TABS: 10.0000 mg | ORAL_TABLET | Freq: Every day | ORAL | Status: DC

## 2023-01-09 MED ORDER — ASPIRIN 81 MG PO TBEC
81.0000 mg | DELAYED_RELEASE_TABLET | Freq: Every day | ORAL | 3 refills | Status: DC
  Filled 2023-01-09: qty 120, 120d supply, fill #0

## 2023-01-09 MED ORDER — FAMOTIDINE 40 MG PO TABS: 80.0000 mg | ORAL_TABLET | Freq: Every day | ORAL | Status: DC

## 2023-01-09 MED ORDER — RIVAROXABAN 20 MG PO TABS
20.0000 mg | ORAL_TABLET | Freq: Every day | ORAL | Status: DC
  Administered 2023-01-09: 20 mg via ORAL
  Filled 2023-01-09: qty 1

## 2023-01-09 MED ORDER — METOPROLOL TARTRATE 50 MG PO TABS
50.0000 mg | ORAL_TABLET | Freq: Two times a day (BID) | ORAL | Status: DC
  Administered 2023-01-09: 50 mg via ORAL
  Filled 2023-01-09: qty 1

## 2023-01-09 MED FILL — Heparin Sodium (Porcine) Inj 1000 Unit/ML: INTRAMUSCULAR | Qty: 10 | Status: AC

## 2023-01-09 MED FILL — Verapamil HCl IV Soln 2.5 MG/ML: INTRAVENOUS | Qty: 2 | Status: AC

## 2023-01-09 NOTE — Consult Note (Signed)
   [facility] Inpatient Consult   01/09/2023  Todd [name] 1940/09/09 [id number]  [facility] [[facility]]  Accountable Care Organization [ACO] Patient: [employer]  Primary Care Provider:  [name], MD with [place] [facility]   Patient screened for hospitalization with noted medium risk score for unplanned readmission risk to assess for potential [facility]  [[facility]] Care Management service needs for post hospital transition for care coordination.  Review of patient's electronic medical record reveals patient is admitted with AFIB with RVR  Met with patient, wife and daughter at the bedside.  They endorse PCP listed and explained the reason for rounding visit for post hospital needs and to anticipate post hospital follow up.  Wife was given the appointment reminder care with the 24 hour nurse advise line.    Plan:  Continue to follow progress and disposition to assess for post hospital community care coordination/management needs.  Referral request for community care coordination: anticipate [facility] TOC for community follow up, SDOH reviewed and intact.  Of note, [facility] Care Management/Population Health does not replace or interfere with any arrangements made by the Inpatient Transition of Care team.  For questions contact:   Charlesetta Shanks, RN BSN CCM [facility]  [phone] business mobile phone Toll free office [phone]  *Concierge Line  [phone] Fax number: [phone] [email or web address]

## 2023-01-09 NOTE — Progress Notes (Signed)
  Transition of Care [facility]) Screening Note   Patient Details  Name: Todd [name] Date of Birth: [birth year]   Transition of Care [facility]) CM/SW Contact:    Leone Haven, RN Phone Number: 01/09/2023, 11:54 AM    Transition of Care Department [facility]) has reviewed patient and no TOC needs have been identified at this time. We will continue to monitor patient advancement through interdisciplinary progression rounds. If new patient transition needs arise, please place a TOC consult.

## 2023-01-09 NOTE — Discharge Summary (Addendum)
Discharge Summary    Patient ID: LOVELLE WESTLING MRN: [id number]; DOB: 1940/09/14  Admit date: 01/07/2023 Discharge date: 01/09/2023  PCP:  [name], MD    [facility] Providers Cardiologist:  Rollene Rotunda, MD     Discharge Diagnoses    Principal Problem:   NSTEMI (non-ST elevated myocardial infarction) [facility]) Active Problems:   Hyperlipidemia   Essential hypertension   Atrial fibrillation (HCC)   Type 2 diabetes, controlled, with peripheral circulatory disorder [facility])   Pulmonary fibrosis (HCC)  Diagnostic Studies/Procedures    Cath: 01/08/2023    Ost Cx to Mid Cx lesion is 70% stenosed.   1st Mrg lesion is 100% stenosed.   3rd Mrg lesion is 100% stenosed.   Prox LAD to Mid LAD lesion is 100% stenosed.   Ost RCA to Prox RCA lesion is 100% stenosed.   Dist LAD lesion is 70% stenosed.   Origin to Prox Graft lesion is 65% stenosed.   LIMA graft was visualized by angiography and is large.   SVG graft was visualized by angiography and is normal in caliber.   The graft exhibits no disease.   LV end diastolic pressure is normal.   3 vessel occlusive CAD.  Patent LIMA to the LAD. The are some collaterals to a first OM Patent SVG to the second OM. There is a smooth moderate stenosis in the proximal SVG 65-70%. Patent SVG to the RCA Normal LVEDP   Plan: there is a discrepancy between chart records and what we are seeing on cath. No old Bypass note available. Chart records indicate there were SVG to OM1, SVG to OM2, SVG to RCA and LIMA to LAD. In fact the patient did have a radial graft used. There are only 2 graft markers in the aorta. The graft to the OMs may have been a sequential graft but now only OM2 supplied. There is modest disease in the SVG to OM but this does not appear like an acute lesion and I suspect his current presentation is due to demand ischemia in setting of AFib with RVR. If he has refractory angina despite optimal management of his arrhythmia we  could consider PCI of the SVG to OM.   Diagnostic Dominance: Right  Echo: 01/08/2023  IMPRESSIONS     1. Left ventricular ejection fraction, by estimation, is 55 to 60%. The  left ventricle has normal function. The left ventricle has no regional  wall motion abnormalities. There is mild concentric left ventricular  hypertrophy. Left ventricular diastolic  function could not be evaluated.   2. Right ventricular systolic function is normal. The right ventricular  size is normal.   3. The mitral valve is grossly normal. Trivial mitral valve  regurgitation. No evidence of mitral stenosis. Moderate mitral annular  calcification.   4. The aortic valve is grossly normal. There is mild calcification of the  aortic valve. Aortic valve regurgitation is not visualized. Aortic valve  sclerosis is present, with no evidence of aortic valve stenosis.   Comparison(s): Prior images reviewed side by side. Changes from prior  study are noted. In atrial flutter throughout the study.   Conclusion(s)/Recommendation(s): Otherwise normal echocardiogram, with  minor abnormalities described in the report. Atrial flutter noted.   FINDINGS   Left Ventricle: Left ventricular ejection fraction, by estimation, is 55  to 60%. The left ventricle has normal function. The left ventricle has no  regional wall motion abnormalities. The left ventricular internal cavity  size was normal in size. There  is   mild concentric left ventricular hypertrophy. Left ventricular diastolic  function could not be evaluated due to atrial fibrillation. Left  ventricular diastolic function could not be evaluated.   Right Ventricle: The right ventricular size is normal. Right vetricular  wall thickness was not well visualized. Right ventricular systolic  function is normal.   Left Atrium: Left atrial size was normal in size.   Right Atrium: Right atrial size was normal in size.   Pericardium: There is no evidence of  pericardial effusion.   Mitral Valve: The mitral valve is grossly normal. Moderate mitral annular  calcification. Trivial mitral valve regurgitation. No evidence of mitral  valve stenosis.   Tricuspid Valve: The tricuspid valve is grossly normal. Tricuspid valve  regurgitation is mild . No evidence of tricuspid stenosis.   Aortic Valve: The aortic valve is grossly normal. There is mild  calcification of the aortic valve. There is moderate aortic valve annular  calcification. Aortic valve regurgitation is not visualized. Aortic valve  sclerosis is present, with no evidence of  aortic valve stenosis. Aortic valve peak gradient measures 6.8 mmHg.   Pulmonic Valve: The pulmonic valve was not well visualized. Pulmonic valve  regurgitation is not visualized. No evidence of pulmonic stenosis.   Aorta: The aortic root, ascending aorta and aortic arch are all  structurally normal, with no evidence of dilitation or obstruction.   Venous: The inferior vena cava was not well visualized.   IAS/Shunts: The atrial septum is grossly normal.  ____________   History of Present Illness     Todd [name] is a 82 y.o. 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  He has been doing well until the day prior to admission when he started feeling chest pressure; it went away however it came back the day of admission. Pt was on his way to church went he started having CP and drove to the fire station. Per EMS patient was found to be in A-fib with RVR.  They gave him 20 mg of Cardizem, 324 ASA. HR improved-with that his CP also improved. He has chronic coughing from lung fibrosis. He was typically able to complete his ADLs without any exertional chest pain or shortness of breath. No leg swelling. In the ED, he was started on dilt drip and his HR were in 90s. Trop 3228---5977. BNP was 790. EKG showed some TWI. Cardiology was consulted for  admission. There has been no new shortness of breath, PND or orthopnea. He says he has not missed doses of xarelto.  He was admitted for further management.  Hospital Course     NSTEMI (demand ischemia) CAD status post CABG '99 -- Presented with episode of chest pain in the setting of atrial fibrillation with RVR.  High-sensitivity troponin peaked at 5977.  Underwent cardiac catheterization noted above with patent LIMA to LAD, SVG to OM 2, SVG to RCA.  SVG to OM 2 with smooth moderate stenosis in the proximal part of graft of 65 to 70%.  Of note there is discrepancy between chart records.  It was felt that his current presentation was secondary to demand ischemia in the setting of A-fib RVR with recommendations for medical therapy.  If refractory angina despite optimal management of his atrial fibrillation then could consider PCI of SVG to OM. -- Echo showed LVEF of 55 to 60%, no regional wall motion abnormality, normal RV size and function, trivial  MR -- Continue aspirin, atorvastatin, metoprolol 50 mg twice daily  Paroxysmal atrial fibrillation -- Presented with atrial fibrillation with RVR, placed on IV diltiazem and ultimately converted to sinus rhythm.  Of note he was recently placed on prednisone as well as albuterol inhaler per pulmonary secondary to his chronic cough.  Suspect this may have contributed to his episode of atrial fibrillation with RVR. -- Transitioned back to metoprolol 50 mg twice daily as well as Xarelto 20 mg daily  Pulmonary fibrosis Chronic cough -- Reports this issue has been chronic since COVID.  Chest x-ray this admission showed interstitial opacities compatible with pulmonary fibrosis.  Continued crackles on lung exam. -- continue prednisone   HLD -- LDL 59, HDL 36 -- switched from Zocor to atorvastatin 80mg  daily   DM -- Hgb A1c 8.1 (12/18/2022) -- resume glipizide, farxiga, metformin   General: Well developed, well nourished, male appearing in no acute  distress. Head: Normocephalic, atraumatic.  Neck: Supple without bruits, JVD. Lungs:  Resp regular and unlabored, CTA. Heart: RRR, S1, S2, no S3, S4, or murmur; no rub. Abdomen: Soft, non-tender, non-distended with normoactive bowel sounds. No hepatomegaly. No rebound/guarding. No obvious abdominal masses. Extremities: No clubbing, cyanosis, edema. Distal pedal pulses are 2+ bilaterally. Right femoral cath site stable without bruising or hematoma Neuro: Alert and oriented X 3. Moves all extremities spontaneously. Psych: Normal affect.  Patient seen by Dr. Allyson Sabal and deemed stable for discharge home. Follow up in the office arranged. Medications sent to patient's pharmacy of choice.   Did the patient have an acute coronary syndrome (MI, NSTEMI, STEMI, etc) this admission?:  No.   The elevated Troponin was due to the acute medical illness (demand ischemia).   The patient will be scheduled for a TOC follow up appointment in 10-14 days.  A message has been sent to the [facility] and Scheduling Pool at the office where the patient should be seen for follow up.  _____________  Discharge Vitals Blood pressure 116/71, pulse 68, temperature 97.6 F (36.4 C), temperature source Oral, resp. rate 16, height 5\' 8"  (1.727 m), weight 70 kg, SpO2 96 %.  Filed Weights   01/07/23 1906 01/08/23 1840 01/09/23 0615  Weight: 72.1 kg 70.5 kg 70 kg    Labs & Radiologic Studies    CBC Recent Labs    01/07/23 1950 01/08/23 0352 01/09/23 0756  WBC 8.6 8.6 6.9  NEUTROABS 6.2  --   --   HGB 12.4* 12.3* 11.8*  HCT 37.7* 36.6* 34.6*  MCV 105.6* 103.1* 101.5*  PLT 181 160 150   Basic Metabolic Panel Recent Labs    16/10/96 1950 01/08/23 0352  NA 140 137  K 4.2 3.7  CL 105 102  CO2 24 23  GLUCOSE 276* 141*  BUN 22 17  CREATININE 1.08 0.77  CALCIUM 9.3 8.7*  MG 1.6*  --    Liver Function Tests No results for input(s): "AST", "ALT", "ALKPHOS", "BILITOT", "PROT", "ALBUMIN" in the last 72 hours. No  results for input(s): "LIPASE", "AMYLASE" in the last 72 hours. High Sensitivity Troponin:   Recent Labs  Lab 01/07/23 1950 01/07/23 2145  TROPONINIHS 3,228* 5,977*    BNP Invalid input(s): "POCBNP" D-Dimer No results for input(s): "DDIMER" in the last 72 hours. Hemoglobin A1C No results for input(s): "HGBA1C" in the last 72 hours. Fasting Lipid Panel Recent Labs    01/08/23 0352  CHOL 125  HDL 36*  LDLCALC 59  TRIG 045*  CHOLHDL 3.5   Thyroid  Function Tests No results for input(s): "TSH", "T4TOTAL", "T3FREE", "THYROIDAB" in the last 72 hours.  Invalid input(s): "FREET3" _____________  CARDIAC CATHETERIZATION  Result Date: 01/08/2023   Suezanne Jacquet Cx to Mid Cx lesion is 70% stenosed.   1st Mrg lesion is 100% stenosed.   3rd Mrg lesion is 100% stenosed.   Prox LAD to Mid LAD lesion is 100% stenosed.   Ost RCA to Prox RCA lesion is 100% stenosed.   Dist LAD lesion is 70% stenosed.   Origin to Prox Graft lesion is 65% stenosed.   LIMA graft was visualized by angiography and is large.   SVG graft was visualized by angiography and is normal in caliber.   The graft exhibits no disease.   LV end diastolic pressure is normal. 3 vessel occlusive CAD. Patent LIMA to the LAD. The are some collaterals to a first OM Patent SVG to the second OM. There is a smooth moderate stenosis in the proximal SVG 65-70%. Patent SVG to the RCA Normal LVEDP Plan: there is a discrepancy between chart records and what we are seeing on cath. No old Bypass note available. Chart records indicate there were SVG to OM1, SVG to OM2, SVG to RCA and LIMA to LAD. In fact the patient did have a radial graft used. There are only 2 graft markers in the aorta. The graft to the OMs may have been a sequential graft but now only OM2 supplied. There is modest disease in the SVG to OM but this does not appear like an acute lesion and I suspect his current presentation is due to demand ischemia in setting of AFib with RVR. If he has  refractory angina despite optimal management of his arrhythmia we could consider PCI of the SVG to OM.   ECHOCARDIOGRAM COMPLETE  Result Date: 01/08/2023    ECHOCARDIOGRAM REPORT   Patient Name:   MICHELL WITHERELL Saravia Date of Exam: 01/08/2023 Medical Rec #:  [id number]     Height:       68.0 in Accession #:    [id number]    Weight:       159.0 lb Date of Birth:  Nov 30, 1940      BSA:          1.854 m Patient Age:    81 years      BP:           113/76 mmHg Patient Gender: M             HR:           119 bpm. Exam Location:  Inpatient Procedure: 2D Echo, Cardiac Doppler and Color Doppler Indications:    Abnormal ECG R94.31  History:        Patient has prior history of Echocardiogram examinations, most                 recent 11/10/2013. CAD and Previous Myocardial Infarction,                 Arrythmias:Atrial Fibrillation; Risk Factors:Hypertension,                 Diabetes and Dyslipidemia.  Sonographer:    Lucendia Herrlich Referring Phys: [id number] [facility] S KHAN  Sonographer Comments: Image acquisition challenging due to respiratory motion. IMPRESSIONS  1. Left ventricular ejection fraction, by estimation, is 55 to 60%. The left ventricle has normal function. The left ventricle has no regional wall motion abnormalities. There is mild concentric left ventricular hypertrophy. Left ventricular diastolic function  could not be evaluated.  2. Right ventricular systolic function is normal. The right ventricular size is normal.  3. The mitral valve is grossly normal. Trivial mitral valve regurgitation. No evidence of mitral stenosis. Moderate mitral annular calcification.  4. The aortic valve is grossly normal. There is mild calcification of the aortic valve. Aortic valve regurgitation is not visualized. Aortic valve sclerosis is present, with no evidence of aortic valve stenosis. Comparison(s): Prior images reviewed side by side. Changes from prior study are noted. In atrial flutter throughout the study.  Conclusion(s)/Recommendation(s): Otherwise normal echocardiogram, with minor abnormalities described in the report. Atrial flutter noted. FINDINGS  Left Ventricle: Left ventricular ejection fraction, by estimation, is 55 to 60%. The left ventricle has normal function. The left ventricle has no regional wall motion abnormalities. The left ventricular internal cavity size was normal in size. There is  mild concentric left ventricular hypertrophy. Left ventricular diastolic function could not be evaluated due to atrial fibrillation. Left ventricular diastolic function could not be evaluated. Right Ventricle: The right ventricular size is normal. Right vetricular wall thickness was not well visualized. Right ventricular systolic function is normal. Left Atrium: Left atrial size was normal in size. Right Atrium: Right atrial size was normal in size. Pericardium: There is no evidence of pericardial effusion. Mitral Valve: The mitral valve is grossly normal. Moderate mitral annular calcification. Trivial mitral valve regurgitation. No evidence of mitral valve stenosis. Tricuspid Valve: The tricuspid valve is grossly normal. Tricuspid valve regurgitation is mild . No evidence of tricuspid stenosis. Aortic Valve: The aortic valve is grossly normal. There is mild calcification of the aortic valve. There is moderate aortic valve annular calcification. Aortic valve regurgitation is not visualized. Aortic valve sclerosis is present, with no evidence of aortic valve stenosis. Aortic valve peak gradient measures 6.8 mmHg. Pulmonic Valve: The pulmonic valve was not well visualized. Pulmonic valve regurgitation is not visualized. No evidence of pulmonic stenosis. Aorta: The aortic root, ascending aorta and aortic arch are all structurally normal, with no evidence of dilitation or obstruction. Venous: The inferior vena cava was not well visualized. IAS/Shunts: The atrial septum is grossly normal.  LEFT VENTRICLE PLAX 2D LVIDd:          3.50 cm   Diastology LVIDs:         2.50 cm   LV e' medial:    7.77 cm/s LV PW:         1.10 cm   LV E/e' medial:  10.6 LV IVS:        1.10 cm   LV e' lateral:   9.32 cm/s LVOT diam:     2.00 cm   LV E/e' lateral: 8.9 LV SV:         36 LV SV Index:   19 LVOT Area:     3.14 cm  RIGHT VENTRICLE RV S prime:     10.40 cm/s TAPSE (M-mode): 1.3 cm LEFT ATRIUM             Index        RIGHT ATRIUM           Index LA diam:        4.70 cm 2.54 cm/m   RA Area:     12.10 cm LA Vol (A2C):   44.7 ml 24.11 ml/m  RA Volume:   21.50 ml  11.60 ml/m LA Vol (A4C):   51.1 ml 27.56 ml/m LA Biplane Vol: 51.9 ml 28.00 ml/m  AORTIC VALVE AV Area (  Vmax): 1.80 cm AV Vmax:        130.00 cm/s AV Peak Grad:   6.8 mmHg LVOT Vmax:      74.53 cm/s LVOT Vmean:     48.833 cm/s LVOT VTI:       0.114 m  AORTA Ao Root diam: 3.30 cm Ao Asc diam:  3.70 cm MITRAL VALVE               TRICUSPID VALVE MV Area (PHT): 5.13 cm    TR Peak grad:   27.7 mmHg MV Decel Time: 148 msec    TR Vmax:        263.00 cm/s MV E velocity: 82.50 cm/s                            SHUNTS                            Systemic VTI:  0.11 m                            Systemic Diam: 2.00 cm Jodelle Red MD Electronically signed by Jodelle Red MD Signature Date/Time: 01/08/2023/11:58:18 AM    Final    DG Chest 2 View  Result Date: 01/07/2023 CLINICAL DATA:  Chest pain EXAM: CHEST - 2 VIEW COMPARISON:  12/18/2022 FINDINGS: Low lung volumes. Stable cardiomediastinal silhouette. Sternotomy and CABG. Diffuse bilateral predominantly interstitial opacities greatest in the left mid and lower lung, unchanged from prior. No pleural effusion or pneumothorax. No displaced rib fractures. IMPRESSION: No change from 12/18/2022. Left-greater-than-right interstitial opacities compatible with known pulmonary fibrosis. Superimposed infection or edema are not excluded. Electronically Signed   By: Minerva Fester M.D.   On: 01/07/2023 20:22   DG Chest 2 View  Result Date:  12/21/2022 CLINICAL DATA:  82 year old male with chronic cough EXAM: CHEST - 2 VIEW COMPARISON:  05/18/2022, chest CT 07/20/2022, 06/30/2021 FINDINGS: Cardiomediastinal silhouette unchanged in size and contour with surgical changes of median sternotomy and CABG. Diffuse bilateral reticulonodular opacities worst on the left, similar distribution to the recent comparison and dated 05/06/2021. There is perceived worsening in the left mid lung over time. No pleural effusion or pneumothorax. IMPRESSION: Increasing reticulonodular opacities, left greater than right in this patient with known pulmonary fibrosis. Superimposed multifocal infection cannot be excluded. Either follow-up chest x-ray after therapy or proceed with chest CT would be recommended. Electronically Signed   By: Gilmer Mor D.O.   On: 12/21/2022 12:50   Disposition   Pt is being discharged home today in good condition.  Follow-up Plans & Appointments     Follow-up Information     Corrin Parker, PA-C Follow up on 01/19/2023.   Specialty: Cardiology Why: at 10:55am for your follow up appt Contact information: [address] [place] [address] [phone]                Discharge Instructions     AMB referral to Phase II Cardiac Rehabilitation   Complete by: As directed    Diagnosis: NSTEMI   After initial evaluation and assessments completed: Virtual Based Care may be provided alone or in conjunction with Phase 2 Cardiac Rehab based on patient barriers.: Yes   Intensive Cardiac Rehabilitation (ICR) [facility] location only OR Traditional Cardiac Rehabilitation (TCR) *If criteria for ICR are not met will enroll in TCR (  [facility] only): Yes   Call MD for:  difficulty breathing, headache or visual disturbances   Complete by: As directed    Call MD for:  persistant dizziness or light-headedness   Complete by: As directed    Call MD for:  redness, tenderness, or signs of infection (pain, swelling, redness, odor or  green/yellow discharge around incision site)   Complete by: As directed    Diet - low sodium heart healthy   Complete by: As directed    Discharge instructions   Complete by: As directed    Groin Site Care Refer to this sheet in the next few weeks. These instructions provide you with information on caring for yourself after your procedure. Your caregiver may also give you more specific instructions. Your treatment has been planned according to current medical practices, but problems sometimes occur. Call your caregiver if you have any problems or questions after your procedure. HOME CARE INSTRUCTIONS You may shower 24 hours after the procedure. Remove the bandage (dressing) and gently wash the site with plain soap and water. Gently pat the site dry.  Do not apply powder or lotion to the site.  Do not sit in a bathtub, swimming pool, or whirlpool for 5 to 7 days.  No bending, squatting, or lifting anything over 10 pounds (4.5 kg) as directed by your caregiver.  Inspect the site at least twice daily.  Do not drive home if you are discharged the same day of the procedure. Have someone else drive you.  You may drive 24 hours after the procedure unless otherwise instructed by your caregiver.  What to expect: Any bruising will usually fade within 1 to 2 weeks.  Blood that collects in the tissue (hematoma) may be painful to the touch. It should usually decrease in size and tenderness within 1 to 2 weeks.  SEEK IMMEDIATE MEDICAL CARE IF: You have unusual pain at the groin site or down the affected leg.  You have redness, warmth, swelling, or pain at the groin site.  You have drainage (other than a small amount of blood on the dressing).  You have chills.  You have a fever or persistent symptoms for more than 72 hours.  You have a fever and your symptoms suddenly get worse.  Your leg becomes pale, cool, tingly, or numb.  You have heavy bleeding from the site. Hold pressure on the site. .    Increase activity slowly   Complete by: As directed         Discharge Medications   Allergies as of 01/09/2023       Reactions   Amlodipine Besylate    Rash Because of a history of documented adverse serious drug reaction;Medi Alert bracelet  is recommended   Iodinated Contrast Media Rash   Rash Because of a history of documented adverse serious drug reaction;Medi Alert bracelet  is recommended Rash, Because of a history of documented adverse serious drug reaction;Medi Alert bracelet  is recommended        Medication List     STOP taking these medications    simvastatin 40 MG tablet Commonly known as: ZOCOR       TAKE these medications    acetaminophen 650 MG CR tablet Commonly known as: TYLENOL Take 650 mg by mouth every 8 (eight) hours as needed for pain.   albuterol 108 (90 Base) MCG/ACT inhaler Commonly known as: VENTOLIN HFA Inhale 2 puffs into the lungs every 6 (six) hours as needed for wheezing or shortness  of breath.   amitriptyline 10 MG tablet Commonly known as: ELAVIL Take 1 tablet (10 mg total) by mouth at bedtime.   aspirin EC 81 MG tablet Take 1 tablet (81 mg total) by mouth daily. Swallow whole. Start taking on: Jan 10, 2023   atorvastatin 80 MG tablet Commonly known as: LIPITOR Take 1 tablet (80 mg total) by mouth daily. Start taking on: Jan 10, 2023   cholecalciferol 25 MCG (1000 UNIT) tablet Commonly known as: VITAMIN D3 Take 1,000 Units by mouth daily.   dapagliflozin propanediol 10 MG Tabs tablet Commonly known as: [name] Take 1 tablet (10 mg total) by mouth daily before breakfast.   famotidine 40 MG tablet Commonly known as: Pepcid Take 2 tablets (80 mg total) by mouth at bedtime.   fexofenadine 180 MG tablet Commonly known as: ALLEGRA Take 180 mg by mouth at bedtime.   FISH OIL PO Take 1 tablet by mouth 2 (two) times daily.   FreeStyle Libre 14 Day Reader Hardie Pulley UAD to check sugars.  E11.9   FreeStyle Libre 14 Day  Sensor Misc UAD to check sugars.  E11.9   glipiZIDE 5 MG tablet Commonly known as: GLUCOTROL TAKE 2 TABLETS BY MOUTH WITH BREAKFAST AND 1 TABLET WITH DINNER. What changed:  how much to take how to take this when to take this additional instructions   ipratropium 0.06 % nasal spray Commonly known as: ATROVENT Place 2-4 sprays into both nostrils every 8 (eight) hours as needed for rhinitis.   ipratropium-albuterol 0.5-2.5 (3) MG/3ML Soln Commonly known as: DUONEB Take 3 mLs by nebulization every 4 (four) hours as needed. What changed: reasons to take this   metFORMIN 500 MG 24 hr tablet Commonly known as: GLUCOPHAGE-XR 2 tabs with breakfast, 1 tab with lunch, 2 tabs with dinner Start taking on: Jan 11, 2023 What changed: These instructions start on Jan 11, 2023. If you are unsure what to do until then, ask your doctor or other care provider.   metoprolol tartrate 50 MG tablet Commonly known as: LOPRESSOR Take 1 tablet (50 mg total) by mouth 2 (two) times daily.   MULTIVITAMIN PO Take 1 tablet by mouth daily.   nitroGLYCERIN 0.4 MG SL tablet Commonly known as: Nitrostat Place 1 tablet (0.4 mg total) under the tongue every 5 (five) minutes as needed for chest pain.   Ofev 100 MG Caps Generic drug: Nintedanib Take 1 capsule (100 mg total) by mouth 2 (two) times daily.   omeprazole 40 MG capsule Commonly known as: PRILOSEC Take 1 capsule (40 mg total) by mouth daily.   OneTouch Delica Lancets 33G Misc TEST BLOOD SUGAR ONCE DAILY   OneTouch Verio test strip Generic drug: glucose blood USE UP TO 4 TIMES A DAY AS DIRECTED   predniSONE 10 MG tablet Commonly known as: DELTASONE Take 1 tablet (10 mg total) by mouth daily with breakfast.   rivaroxaban 20 MG Tabs tablet Commonly known as: Xarelto Take 1 tablet (20 mg total) by mouth daily with supper.   ZINC PO Take 1 tablet by mouth daily.        Outstanding Labs/Studies   FLP/LFTs in 8 weeks   Duration of  Discharge Encounter   Greater than 30 minutes including physician time.  Signed, Laverda Page, NP 01/09/2023, 11:33 AM   Agree with note by Laverda Page NP-C  Patient mated with non-STEMI in the setting of A-fib with RVR.  His troponins went up to approximately 6000.  His EF was normal by  2D echo.  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  The other grafts were patent.  He converted to sinus rhythm and was transition to oral beta-blocker and a DOAC.Marland Kitchen  He he was pain-free this morning and stable.  His exam was benign.  He was stable for discharge home.  Will arrange outpatient follow-up with his primary cardiologist, Dr. Antoine Poche.  Runell Gess, M.D., FACP, [facility], Earl Lagos [facility] [address] [place], [place]  [address]  [phone] 01/09/2023 12:05 PM

## 2023-01-09 NOTE — Telephone Encounter (Signed)
Patient is still in the hospital. Will call at a later time

## 2023-01-09 NOTE — Telephone Encounter (Signed)
   Transition of Care Follow-up Phone Call Request    Patient Name: Todd [name] Date of Birth: [birth year] Date of Encounter: 01/09/2023  Primary Care Provider:  [name], MD Primary Cardiologist:  Rollene Rotunda, MD  Todd [name] has been scheduled for a transition of care follow up appointment with a [facility] provider:   Please reach out to Todd [name] within 48 hours to confirm appointment and review transition of care protocol questionnaire.  Laverda Page, NP  01/09/2023, 10:49 AM

## 2023-01-10 LAB — LIPOPROTEIN A (LPA): Lipoprotein (a): 192.3 nmol/L — ABNORMAL HIGH (ref <75.0)

## 2023-01-10 NOTE — Telephone Encounter (Signed)
Patient contacted regarding discharge from [facility]  on 01/09/23.  Patient understands to follow up with provider [name] on 01/19/23 at 10:55 at [facility]. Patient understands discharge instructions? YES Patient understands medications and regiment? YES Patient understands to bring all medications to this visit? YES  Ask patient:  Are you enrolled in My Chart YES

## 2023-01-11 ENCOUNTER — Encounter: Payer: Self-pay | Admitting: *Deleted

## 2023-01-11 ENCOUNTER — Ambulatory Visit: Payer: Self-pay

## 2023-01-11 ENCOUNTER — Telehealth: Payer: Self-pay | Admitting: *Deleted

## 2023-01-11 NOTE — Transitions of Care (Post Inpatient/ED Visit) (Signed)
01/11/2023  Name: Todd [name] MRN: [id number] DOB: Jan 15, 1941  Today's TOC FU Call Status: Today's TOC FU Call Status:: Successful TOC FU Call Competed TOC FU Call Complete Date: 01/11/23  Transition Care Management Follow-up Telephone Call Date of Discharge: 01/09/23 Discharge Facility: [facility]) Type of Discharge: Inpatient Admission Primary Inpatient Discharge Diagnosis:: NSTEMI due to demand ischemis secondary to AF with RVR How have you been since you were released from the hospital?: Better Any questions or concerns?: No  Items Reviewed: Did you receive and understand the discharge instructions provided?: Yes (thoroughly reviewed with patient' spouse who verbalizes good understanding of same) Medications obtained,verified, and reconciled?: Yes (Medications Reviewed) (Full medication reconciliation/ review completed; no concerns or discrepancies identified; confirmed patient obtained/ is taking all newly Rx'd medications as instructed; spouse-manages medications and denies questions/ concerns around medications today) Any new allergies since your discharge?: No Dietary orders reviewed?: Yes Type of Diet Ordered:: "Heart Healthy/ low carb" Do you have support at home?: Yes People in Home: spouse Name of Support/Comfort Primary Source: Reports independent in self-care activities; spouse and local adult daughter assists as/ if needed/ indicated  Medications Reviewed Today: Medications Reviewed Today     Reviewed by Michaela Corner, RN (Registered Nurse) on 01/11/23 at 1130  Med List Status: <None>   Medication Order Taking? Sig Documenting Provider Last Dose Status Informant  acetaminophen (TYLENOL) 650 MG CR tablet [id number] Yes Take 650 mg by mouth every 8 (eight) hours as needed for pain. [provider] Taking Active Self, Spouse/Significant Other, Child, Pharmacy Records  albuterol (VENTOLIN HFA) 108 (90 Base) MCG/ACT inhaler [id number] No Inhale 2 puffs into  the lungs every 6 (six) hours as needed for wheezing or shortness of breath.  Patient not taking: Reported on 01/11/2023   Chilton Greathouse, MD Not Taking Active Self, Spouse/Significant Other, Child, Pharmacy Records           Med Note Jonnie Kind Jan 11, 2023 11:22 AM) 01/11/23: reports during [facility] call no longer taking per cardiology provider instructions  amitriptyline (ELAVIL) 10 MG tablet [id number] Yes Take 1 tablet (10 mg total) by mouth at bedtime. Hermelinda Dellen, MD Taking Active   aspirin EC 81 MG tablet [id number] Yes Take 1 tablet (81 mg total) by mouth daily. Swallow whole. Hermelinda Dellen, MD Taking Active   atorvastatin (LIPITOR) 80 MG tablet [id number] Yes Take 1 tablet (80 mg total) by mouth daily. Hermelinda Dellen, MD Taking Active   cholecalciferol (VITAMIN D3) 25 MCG (1000 UNIT) tablet [id number] Yes Take 1,000 Units by mouth daily. [provider] Taking Active Self, Spouse/Significant Other, Child, Pharmacy Records  Continuous Blood Gluc Receiver (FREESTYLE LIBRE 14 DAY READER) DEVI [id number] No UAD to check sugars.  E11.9  Patient not taking: Reported on 01/11/2023   [name], MD Not Taking Active Self, Spouse/Significant Other, Child, Pharmacy Records           Med Note Jonnie Kind Jan 11, 2023 11:25 AM) 01/11/23: reports during [facility] call, no longer using-- states it stopped working and he is now doing glucometer checks/ finger sticks  Continuous Blood Gluc Sensor (FREESTYLE LIBRE 14 DAY SENSOR) [place] [id number] No UAD to check sugars.  E11.9  Patient not taking: Reported on 01/11/2023   [name], MD Not Taking Active Self, Spouse/Significant Other, Child, Pharmacy Records           Med Note Michaela Corner  Thu Jan 11, 2023 11:25 AM) 01/11/23: reports during TOC call, no longer using-- states it stopped working and he is now doing glucometer checks/ finger sticks   dapagliflozin propanediol ([name]) 10 MG TABS tablet [id number] Yes Take 1  tablet (10 mg total) by mouth daily before breakfast. [name], MD Taking Active Self, Spouse/Significant Other, Child, Pharmacy Records  famotidine (PEPCID) 40 MG tablet [id number] Yes Take 2 tablets (80 mg total) by mouth at bedtime. Hermelinda Dellen, MD Taking Active   fexofenadine [facility]) 180 MG tablet [id number] Yes Take 180 mg by mouth at bedtime. [provider] Taking Active Self, Spouse/Significant Other, Child, Pharmacy Records  glipiZIDE (GLUCOTROL) 5 MG tablet [id number] Yes TAKE 2 TABLETS BY MOUTH WITH BREAKFAST AND 1 TABLET WITH DINNER.  Patient taking differently: Take 5-10 mg by mouth See admin instructions. Take 10 mg (2 tablets) by mouth every morning and 5 mg (1 tablet) every night with dinner.   [name], MD Taking Active Self, Spouse/Significant Other, Child, Pharmacy Records  ipratropium (ATROVENT) 0.06 % nasal spray [id number] Yes Place 2-4 sprays into both nostrils every 8 (eight) hours as needed for rhinitis. [provider] Taking Active Self, Spouse/Significant Other, Child, Pharmacy Records           Med Note Jonnie Kind Jan 11, 2023 11:26 AM) 01/11/23: reports during [facility] call uses "occasionally"  ipratropium-albuterol (DUONEB) 0.5-2.5 (3) MG/3ML SOLN [id number] No Take 3 mLs by nebulization every 4 (four) hours as needed.  Patient not taking: Reported on 01/11/2023   Chilton Greathouse, MD Not Taking Active Self, Spouse/Significant Other, Child, Pharmacy Records           Med Note Jonnie Kind Jan 11, 2023 11:27 AM) 01/11/23: Reports during TOC call no longer using- have returned to [facility]  metFORMIN (GLUCOPHAGE-XR) 500 MG 24 hr tablet [id number] Yes 2 tabs with breakfast, 1 tab with lunch, 2 tabs with dinner Hermelinda Dellen, MD Taking Active            Med Note Jonnie Kind Jan 11, 2023 11:28 AM) 01/11/23: reports during [facility] call takes 2 BID "all the time;" seldomly needs to take during lunch/ mid-day  metoprolol  tartrate (LOPRESSOR) 50 MG tablet [id number] Yes Take 1 tablet (50 mg total) by mouth 2 (two) times daily. Rollene Rotunda, MD Taking Active Self, Spouse/Significant Other, Child, Pharmacy Records  Multiple Vitamin (MULTIVITAMIN PO) [id number] Yes Take 1 tablet by mouth daily. [provider] Taking Active Self, Spouse/Significant Other, Child, Pharmacy Records  Multiple Vitamins-Minerals (ZINC PO) [id number] Yes Take 1 tablet by mouth daily. [provider] Taking Active Self, Spouse/Significant Other, Child, Pharmacy Records  Nintedanib (OFEV) 100 MG CAPS [id number] Yes Take 1 capsule (100 mg total) by mouth 2 (two) times daily. Chilton Greathouse, MD Taking Active Self, Spouse/Significant Other, Child, Pharmacy Records  nitroGLYCERIN (NITROSTAT) 0.4 MG SL tablet [id number] Yes Place 1 tablet (0.4 mg total) under the tongue every 5 (five) minutes as needed for chest pain. Rollene Rotunda, MD Taking Active Self, Spouse/Significant Other, Child, Pharmacy Records  Omega-3 Fatty Acids (FISH OIL PO) [id number] Yes Take 1 tablet by mouth 2 (two) times daily. [provider] Taking Active Self, Spouse/Significant Other, Child, Pharmacy Records  omeprazole (PRILOSEC) 40 MG capsule [id number] Yes Take 1 capsule (40 mg total) by mouth daily. Hermelinda Dellen, MD Taking Active   [facility] LANCETS 33G [place] [id number] Yes TEST  BLOOD SUGAR ONCE DAILY Burns, [name], MD Taking Active Self, Spouse/Significant Other, Child, Pharmacy Records  [facility] VERIO test strip [id number] Yes USE UP TO 4 TIMES A DAY AS DIRECTED Burns, [name], MD Taking Active Self, Spouse/Significant Other, Child, Pharmacy Records  predniSONE (DELTASONE) 10 MG tablet [id number] Yes Take 1 tablet (10 mg total) by mouth daily with breakfast. Chilton Greathouse, MD Taking Active Self, Spouse/Significant Other, Child, Pharmacy Records  rivaroxaban (XARELTO) 20 MG TABS tablet [id number] Yes Take 1 tablet (20 mg total) by mouth daily  with supper. Rollene Rotunda, MD Taking Active Self, Spouse/Significant Other, Child, Pharmacy Records            Home Care and Equipment/Supplies: Were Home Health Services Ordered?: No Any new equipment or medical supplies ordered?: No  Functional Questionnaire: Do you need assistance with bathing/showering or dressing?: No Do you need assistance with meal preparation?: No Do you need assistance with eating?: No Do you have difficulty maintaining continence: No Do you need assistance with getting out of bed/getting out of a chair/moving?: No Do you have difficulty managing or taking your medications?: Yes (spouse manages all aspects of medication administration)  Follow up appointments reviewed: PCP Follow-up appointment confirmed?: Yes Date of PCP follow-up appointment?: 01/17/23 Follow-up Provider: PCP, Dr. Lawerance Bach Specialist [facility] Follow-up appointment confirmed?: Yes Date of Specialist follow-up appointment?: 01/19/23 Follow-Up Specialty Provider:: cardiology provider Do you need transportation to your follow-up appointment?: No Do you understand care options if your condition(s) worsen?: Yes-patient verbalized understanding  SDOH Interventions Today    Flowsheet Row Most Recent Value  SDOH Interventions   Food Insecurity Interventions Intervention Not Indicated  Transportation Interventions Intervention Not Indicated  [normally drives self,  daughter assists as needed/ indicated]      TOC Interventions Today    Flowsheet Row Most Recent Value  TOC Interventions   TOC Interventions Discussed/Reviewed TOC Interventions Discussed, Post op wound/incision care  [Caregiver/ spouse declines need for ongoing/ further care coordination outreach,  no care coordination needs identified at time of TOC call today,  provided my direct contact information should questions/ concerns/ needs arise post-TOC call]      Interventions Today    Flowsheet Row Most Recent Value   Chronic Disease   Chronic disease during today's visit Other, Atrial Fibrillation (AFib), Diabetes  [NSTEMI]  General Interventions   General Interventions Discussed/Reviewed General Interventions Discussed, Doctor Visits  Doctor Visits Discussed/Reviewed Doctor Visits Discussed, Specialist, PCP  PCP/Specialist Visits Compliance with follow-up visit  Education Interventions   Education Provided Provided Education  Provided Verbal Education On Medication, Other, When to see the doctor  [safe use of NTG,  post-cardiac cath site care/ evaluation- assessment]  Nutrition Interventions   Nutrition Discussed/Reviewed Nutrition Discussed  Pharmacy Interventions   Pharmacy Dicussed/Reviewed Pharmacy Topics Discussed  [Full medication review with updating medication list in EHR per patient report]      Caryl Pina, RN, BSN, CCRN Alumnus RN CM Care Coordination/ Transition of Care- [facility] Care Management [phone]: direct office

## 2023-01-11 NOTE — Chronic Care Management (AMB) (Signed)
   01/11/2023  Ramal Mckethan Martino 1941/06/15 [id number]   Reason for Encounter: Patient is not currently enrolled in the CCM program. CCM status changed to previously enrolled  Alto Denver RN, MSN, CCM RN Care Manager  Chronic Care Management Direct Number: [phone]

## 2023-01-12 ENCOUNTER — Ambulatory Visit: Payer: PPO | Admitting: Internal Medicine

## 2023-01-13 NOTE — Progress Notes (Signed)
Cardiology Office Note:    Date:  01/19/2023   ID:  Todd [name], DOB [birth year], MRN [id number]  PCP:  [name], MD  Cardiologist:  Rollene Rotunda, MD  Electrophysiologist:  None   Referring MD: [name], MD   Chief Complaint: hospital follow-up of atrial fibrillation  History of Present Illness:    Todd [name] is a 82 y.o. 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 hypertension, hyperlipidemia, diabetes mellitus who is followed by Dr. Antoine Poche and presents today for hospital follow-up of atrial fibrillation.   Patient has a long history of CAD s/p remote CABG x4 in 1999 with LIMA to LAD, SVG to OM1, SVG to OM2, SVG to RCA. Most recent ischemic evaluation was a Myoview in 10/2013 which showed no evidence of ischemia or infarction. He also has a history of carotid stenosis s/p left CEA in 1999. Most recent carotid ultrasound in 08/2017 showed mild stenosis of bilateral ICAs.  Patient also has a history of asbestos exposures and has known pulmonary fibrosis for which he follows with Dr. Isaiah Serge. Last high resolution CT in 06/2022 showed findings consistent with severe fibrotic interstitial lung disease with mild basilar predominance and suspected mild early honeycombing (clear progression since CT in 2019 and consistent with UIP).   Patient was last seen by Dr. Antoine Poche in 08/2022 at which time he was stable from a cardiac standpoint.  He was recently admitted from 01/07/2023 to 01/09/2023 for atrial fibrillation with RVR after presenting with chest pressure and was found to have an elevated troponin. High-sensitivity troponin peaked at 5,977. Echo showed LVEF of 55-60% with no regional wall motion abnormalities and mild LVH. He underwent LHC which showed patent LIMA to LAD, SVG to RCA,  and SVG to OM2 with 65% stenosis of the proximal SVG. Elevated troponin was felt to be due to demand ischemia in the setting of rapid atrial fibrillation rather than true ACS. Continued medical therapy was recommended with consideration of PCI to SVG to OM2 if he has refractory angina. He was placed IV Diltiazem and converted back to sinus rhythm and then was transitioned back to PO Metoprolol. Of note, he had recently been place on Prednisone as well as an albuterol inhaler for chronic cough which may have contributed to recurrent atrial fibrillation.  Patient presents today for follow-up. Here with wife and daughter. He is doing well since last visit. He continues to have intermittent palpitations that he describes a his heart beating fast for a little while. However, no recurrent chest pain. No shortness of breath. He has slept at a inclined since having COVID 3 years ago but this is stable. No new or worsening orthopnea. No PND. He has some chronic mild lower extremity edema of his right leg since his remote CABG. This is stable. No lightheadedness, dizziness, or near syncope. He does report feeling very fatigued over the last year and his PCP told him to as [place] if this could be due to his Metoprolol. However, he has been on Metoprolol for many years so I think this is less likely. He denies any abnormal bleeding on Xarelto.   Past Medical History:  Diagnosis Date   Allergic rhinitis    Asthma    Atrial fibrillation with rapid ventricular response (HCC)    a.  newly diagnosed 11/03/13, spont conv to NSR, placed on xarelto.   CAD (coronary artery disease)    a. 1999; s/p CABG: LIMA to LAD, SVG to OM1, SVG to OM2, SVG to RCA  b. Normal nuc 10/2013 (done because of new onset AF.)   COVID    Diabetes mellitus (HCC)    HLD (hyperlipidemia)    HTN (hypertension)    Nephrolithiasis    Overweight(278.02)    PVD (peripheral vascular disease) (HCC)    Carotid stenosis   Stroke [facility])     Past Surgical  History:  Procedure Laterality Date   CAROTID ENDARTERECTOMY  1999   COLONOSCOPY  2014   negative;Dr Jarold Motto   COLONOSCOPY W/ POLYPECTOMY  2009   Dr Jarold Motto   CORONARY ARTERY BYPASS GRAFT  Aug.1999   HEMORRHOID BANDING     INTRACAPSULAR CATARACT EXTRACTION Bilateral 2018   LEFT HEART CATH AND CORS/GRAFTS ANGIOGRAPHY N/A 01/08/2023   Procedure: LEFT HEART CATH AND CORS/GRAFTS ANGIOGRAPHY;  Surgeon: [place], Peter M, MD;  Location: [facility] INVASIVE CV [facility];  Service: Cardiovascular;  Laterality: N/A;   NASAL SINUS SURGERY      Current Medications: Current Meds  Medication Sig   acetaminophen (TYLENOL) 650 MG CR tablet Take 650 mg by mouth every 8 (eight) hours as needed for pain.   albuterol (VENTOLIN HFA) 108 (90 Base) MCG/ACT inhaler Inhale 2 puffs into the lungs every 6 (six) hours as needed for wheezing or shortness of breath.   amitriptyline (ELAVIL) 10 MG tablet Take 1 tablet (10 mg total) by mouth at bedtime.   aspirin EC 81 MG tablet Take 1 tablet (81 mg total) by mouth daily. Swallow whole.   atorvastatin (LIPITOR) 80 MG tablet Take 1 tablet (80 mg total) by mouth daily.   cholecalciferol (VITAMIN D3) 25 MCG (1000 UNIT) tablet Take 1,000 Units by mouth daily.   dapagliflozin propanediol ([name]) 10 MG TABS tablet Take 1 tablet (10 mg total) by mouth daily before breakfast.   famotidine (PEPCID) 40 MG tablet Take 2 tablets (80 mg total) by mouth at bedtime.   fexofenadine (ALLEGRA) 180 MG tablet Take 180 mg by mouth at bedtime.   glipiZIDE (GLUCOTROL) 5 MG tablet TAKE 2 TABLETS BY MOUTH WITH BREAKFAST AND 1 TABLET WITH DINNER.   ipratropium (ATROVENT) 0.06 % nasal spray Place 2-4 sprays into both nostrils every 8 (eight) hours as needed for rhinitis.   metFORMIN (GLUCOPHAGE-XR) 500 MG 24 hr tablet 2 tabs with breakfast, 1 tab with lunch, 2 tabs with dinner   metoprolol tartrate (LOPRESSOR) 50 MG tablet Take 1 tablet (50 mg total) by mouth 2 (two) times daily.   Multiple Vitamin  (MULTIVITAMIN PO) Take 1 tablet by mouth daily.   Multiple Vitamins-Minerals (ZINC PO) Take 1 tablet by mouth daily.   Nintedanib (OFEV) 100 MG CAPS Take 1 capsule (100 mg total) by mouth 2 (two) times daily.   nitroGLYCERIN (NITROSTAT) 0.4 MG SL tablet Place 1 tablet (0.4 mg total) under the tongue every 5 (five) minutes as needed for chest pain.   Omega-3 Fatty Acids (FISH OIL PO) Take 1 tablet by mouth 2 (two) times daily.   omeprazole (PRILOSEC) 40 MG capsule Take 1 capsule (40 mg total) by mouth daily.   ONETOUCH DELICA LANCETS 33G MISC TEST BLOOD SUGAR ONCE DAILY   ONETOUCH VERIO test strip USE UP TO 4 TIMES A DAY AS DIRECTED   predniSONE (DELTASONE) 10 MG tablet Take 1 tablet (10 mg total) by mouth daily with breakfast.  rivaroxaban (XARELTO) 20 MG TABS tablet Take 1 tablet (20 mg total) by mouth daily with supper.     Allergies:   Amlodipine besylate and Iodinated contrast media   Social History   Socioeconomic History   Marital status: Married    Spouse name: Darel Hong   Number of children: 2   Years of education: Not on file   Highest education level: Not on file  Occupational History   Occupation: Sales Manager-retired  Tobacco Use   Smoking status: Never   Smokeless tobacco: Never  Vaping Use   Vaping Use: Never used  Substance and Sexual Activity   Alcohol use: No    Alcohol/week: 0.0 standard drinks of alcohol   Drug use: No   Sexual activity: Yes  Other Topics Concern   Not on file  Social History Narrative   Not on file   Social Determinants of Health   Financial Resource Strain: Low Risk  (08/03/2022)   Overall Financial Resource Strain (CARDIA)    Difficulty of Paying Living Expenses: Not hard at all  Food Insecurity: No Food Insecurity (01/11/2023)   Hunger Vital Sign    Worried About Running Out of Food in the Last Year: Never true    Ran Out of Food in the Last Year: Never true  Transportation Needs: No Transportation Needs (01/11/2023)   PRAPARE -  Administrator, Civil Service (Medical): No    Lack of Transportation (Non-Medical): No  Physical Activity: Inactive (08/03/2022)   Exercise Vital Sign    Days of Exercise per Week: 0 days    Minutes of Exercise per Session: 0 min  Stress: No Stress Concern Present (08/03/2022)   [employer] of Occupational Health - Occupational Stress Questionnaire    Feeling of Stress : Not at all  Social Connections: Socially Integrated (08/03/2022)   Social Connection and Isolation Panel [NHANES]    Frequency of Communication with Friends and Family: More than three times a week    Frequency of Social Gatherings with Friends and Family: More than three times a week    Attends Religious Services: More than 4 times per year    Active Member of [employer] or Organizations: No    Attends Engineer, structural: 1 to 4 times per year    Marital Status: Married     Family History: The patient's family history includes Asthma in his sister; Colon cancer (age of onset: 1) in his brother; Diabetes in his father; Heart attack (age of onset: 75) in his brother; Heart attack (age of onset: 53) in his brother and mother; Hemochromatosis in his brother; Prostate cancer in his brother; Stroke (age of onset: 15) in his father. There is no history of Esophageal cancer, Rectal cancer, or Stomach cancer.  ROS:   Please see the history of present illness.     EKGs/Labs/Other Studies Reviewed:    The following studies were reviewed:  Carotid Ultrasound 08/23/2017: Summary: - Right Carotid: There is evidence in the right ICA of a 1-39% stenosis. Unable to reproduce previous velocities.  - Left Carotid: There is evidence in the left ICA of a 1-39% stenosis s/p CEA.  - Vertebrals: Both vertebral arteries were patent with antegrade flow.  - Subclavians: Normal flow hemodynamics were seen in bilateral subclavian arteries.  _______________  Echocardiogram 01/08/2023: Impressions: 1. Left ventricular  ejection fraction, by estimation, is 55 to 60%. The  left ventricle has normal function. The left ventricle has no regional  wall motion abnormalities.  There is mild concentric left ventricular  hypertrophy. Left ventricular diastolic  function could not be evaluated.   2. Right ventricular systolic function is normal. The right ventricular  size is normal.   3. The mitral valve is grossly normal. Trivial mitral valve  regurgitation. No evidence of mitral stenosis. Moderate mitral annular  calcification.   4. The aortic valve is grossly normal. There is mild calcification of the  aortic valve. Aortic valve regurgitation is not visualized. Aortic valve  sclerosis is present, with no evidence of aortic valve stenosis.   Comparison(s): Prior images reviewed side by side. Changes from prior  study are noted. In atrial flutter throughout the study.  _______________  Left Cardiac Catheterization 01/08/2023:   Ost Cx to Mid Cx lesion is 70% stenosed.   1st Mrg lesion is 100% stenosed.   3rd Mrg lesion is 100% stenosed.   Prox LAD to Mid LAD lesion is 100% stenosed.   Ost RCA to Prox RCA lesion is 100% stenosed.   Dist LAD lesion is 70% stenosed.   Origin to Prox Graft lesion is 65% stenosed.   LIMA graft was visualized by angiography and is large.   SVG graft was visualized by angiography and is normal in caliber.   The graft exhibits no disease.   LV end diastolic pressure is normal.   3 vessel occlusive CAD.  Patent LIMA to the LAD. The are some collaterals to a first OM Patent SVG to the second OM. There is a smooth moderate stenosis in the proximal SVG 65-70%. Patent SVG to the RCA Normal LVEDP   Plan: there is a discrepancy between chart records and what we are seeing on cath. No old Bypass note available. Chart records indicate there were SVG to OM1, SVG to OM2, SVG to RCA and LIMA to LAD. In fact the patient did have a radial graft used. There are only 2 graft markers in the  aorta. The graft to the OMs may have been a sequential graft but now only OM2 supplied. There is modest disease in the SVG to OM but this does not appear like an acute lesion and I suspect his current presentation is due to demand ischemia in setting of AFib with RVR. If he has refractory angina despite optimal management of his arrhythmia we could consider PCI of the SVG to OM.   Diagnostic Dominance: Right      EKG:  EKG ordered today. EKG personally reviewed and demonstrates normal sinus rhythm, rate 89, with frequent PACs,  1st degree AV block, and T wave inversions in leads I and aVL. QTc 457 ms.  Recent Labs: 12/18/2022: ALT 17; TSH 3.05 01/07/2023: B Natriuretic Peptide 790.3; Magnesium 1.6 01/08/2023: BUN 17; Creatinine, Ser 0.77; Potassium 3.7; Sodium 137 01/09/2023: Hemoglobin 11.8; Platelets 150  Recent Lipid Panel    Component Value Date/Time   CHOL 125 01/08/2023 0352   TRIG 152 (H) 01/08/2023 0352   TRIG 94 07/19/2006 0727   HDL 36 (L) 01/08/2023 0352   CHOLHDL 3.5 01/08/2023 0352   VLDL 30 01/08/2023 0352   LDLCALC 59 01/08/2023 0352    Physical Exam:    Vital Signs: BP 106/70   Pulse 89   Ht 5\' 8"  (1.727 m)   Wt 153 lb 12.8 oz (69.8 kg)   SpO2 96%   BMI 23.39 kg/m     Wt Readings from Last 3 Encounters:  01/19/23 153 lb 12.8 oz (69.8 kg)  01/17/23 153 lb (69.4 kg)  01/09/23  154 lb 5.2 oz (70 kg)     General: 82 y.o. Caucasian male in no acute distress. HEENT: Normocephalic and atraumatic. Sclera clear.  Neck: Supple. No JVD. Heart: RRR with ectopy. Distinct S1 and S2. No murmurs, gallops, or rubs.  Lungs: No increased work of breathing. Course crackles noted throughout consistent with known pulmonary fibrosis.  Abdomen: Soft, non-distended, and non-tender to palpation.  Extremities: Mild lower extremity edema of right leg (chronic).  Right femoral cath site soft with no signs of hematoma. Skin: Warm and dry. Neuro: Alert and oriented x3. No focal  deficits. Psych: Normal affect. Responds appropriately.  Assessment:    1. Paroxysmal atrial fibrillation (HCC)   2. Coronary artery disease involving native coronary artery of native heart without angina pectoris   3. S/P CABG (coronary artery bypass graft)   4. Stenosis of carotid artery, unspecified laterality   5. Pulmonary fibrosis (HCC)   6. Primary hypertension   7. Hyperlipidemia, unspecified hyperlipidemia type   8. Type 2 diabetes mellitus with complication, without long-term current use of insulin (HCC)   9. Fatigue, unspecified type     Plan:    Paroxysmal Atrial Fibrillation Recently admitted with rapid atrial fibrillation but converted with IV Diltiazem. Echo showed normal LV function. - Patient reports intermittent palpitations since discharge. - EKG today shows normal sinus rhythm with frequent PACs.  - Continue Lopressor 50mg  twice daily.  - Continue chronic anticoagulation with Xarelto 20mg  daily. Provided samples of this today.  - Will order a 1 week Zio monitor to assess for recurrent atrial fibrillation given ongoing intermittent palpitations.   CAD s/p CABG History of remote CABG in 1999. Recent LHC on 01/08/2023 showed patent LIMA to LAD, SVG to RCA, and SVG to OM2 with 65% stenosis of the proximal SVG. Of note, there was some discrepancy between chart records and what was seen on cath. Records noted LIMA to LAD, SVG to OM1, SVG to OM2, and SVG to RCA. Graft to OMs may have been a sequential graft but only OM2 was supplied on recent cath. Continued medical therapy was recommended with consideration of PCI to SVG to OM2 if he has refractory angina. - No chest pain. - Continue aspirin, beta-blocker, and statin.   Carotid Artery Stenosis History of left CEA in 1999. Most recent carotid dopplers in 08/2017 showed 1-39% stenosis of bilateral ICAs.  - Continue aspirin and statin.  - Dr. Antoine Poche did not feel like any further follow-up studies were needed.     Pulmonary Fibrosis Asbestos Exposure Most recent high resolution CT in 06/2022 showed findings consistent with severe fibrotic interstitial lung disease with mild basilar predominance and suspected mild early honeycombing. 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  - Followed by Pulmonology (Dr. Isaiah Serge).   Hypertension BP well controlled.  - Continue Lopressor 50mg  twice daily.   Hyperlipidemia Lipid panel on 01/08/2023 during recent admission: Total Cholesterol 125, Triglycerides 152, HDL 36, LDL 59. LDL goal <70 given CAD. - Switched from Simvastatin to Atorvastatin 80mg  daily during recent admission. Continue. -Will need lipid panel and LFTs in 8 weeks.    Type 2 Diabetes Mellitus Hemoglobin A1c 8.1% in 11/2022. - On Metformin, Glimepiride, and [name].  - Managed by PCP.  Fatigue Patient reports fatigue for the last year and is wondering if this could be due to the the Metoprolol. However, he has been on this for years. TSH in 11/2022 was normal.  - Given he has  been on Metoprolol for years, I think it is unlikely that his fatigue is due to this. Discussed trying Coreg instead of Metoprolol to see if this helps. However, patient/ family is going to talk with Pulmonology first to see if any of his medications for pulmonary fibrosis could be causing this.   Disposition: Follow up in 4-6 months.    Medication Adjustments/Labs and Tests Ordered: Current medicines are reviewed at length with the patient today.  Concerns regarding medicines are outlined above.  Orders Placed This Encounter  Procedures   Lipid panel   Hepatic function panel   LONG TERM MONITOR (3-14 DAYS)   EKG 12-Lead   No orders of the defined types were placed in this encounter.   Patient Instructions  Medication Instructions:  The current medical regimen is effective;  continue present plan and medications as directed. Please refer to the Current Medication list given  to you today.  *If you need a refill on your cardiac medications before your next appointment, please call your pharmacy*  Lab Work: FASTING LIPID AND LFT IN 8 WEEKS If you have labs (blood work) drawn today and your tests are completely normal, you will receive your results only by: MyChart Message (if you have MyChart) OR A paper copy in the mail If you have any lab test that is abnormal or we need to change your treatment, we will call you to review the results.  Testing/Procedures: NONE  Follow-Up: At [facility], you and your health needs are our priority.  As part of our continuing mission to provide you with exceptional heart care, we have created designated Provider Care Teams.  These Care Teams include your primary Cardiologist (physician) and Advanced Practice Providers (APPs -  Physician Assistants and Nurse Practitioners) who all work together to provide you with the care you need, when you need it.  Your next appointment:   4-6 month(s)  Provider:   Rollene Rotunda, MD  or [name], PA-C        Other Instructions  Christena Deem- Long Term Monitor Instructions  Your physician has requested you wear a ZIO patch monitor for 14 days.  This is a single patch monitor. [employer] supplies one patch monitor per enrollment. Additional stickers are not available. Please do not apply patch if you will be having a Nuclear Stress Test,  Echocardiogram, Cardiac CT, MRI, or Chest Xray during the period you would be wearing the  monitor. The patch cannot be worn during these tests. You cannot remove and re-apply the  ZIO XT patch monitor.  Your ZIO patch monitor will be mailed 3 day USPS to your address on file. It may take 3-5 days  to receive your monitor after you have been enrolled.  Once you have received your monitor, please review the enclosed instructions. Your monitor  has already been registered assigning a specific monitor serial # to you.  Billing and Patient  Assistance Program Information  We have supplied [employer] with any of your insurance information on file for billing purposes. [employer] offers a sliding scale Patient Assistance Program for patients that do not have  insurance, or whose insurance does not completely cover the cost of the ZIO monitor.  You must apply for the Patient Assistance Program to qualify for this discounted rate.  To apply, please call [employer] at [phone], select option 4, select option 2, ask to apply for  Patient Assistance Program. [name] will ask your household income, and how many people  are  in your household. They will quote your out-of-pocket cost based on that information.  [employer] will also be able to set up a 40-month, interest-free payment plan if needed.  Applying the monitor   Shave hair from upper left chest.  Hold abrader disc by orange tab. Rub abrader in 40 strokes over the upper left chest as  indicated in your monitor instructions.  Clean area with 4 enclosed alcohol pads. Let dry.  Apply patch as indicated in monitor instructions. Patch will be placed under collarbone on left  side of chest with arrow pointing upward.  Rub patch adhesive wings for 2 minutes. Remove white label marked "1". Remove the white  label marked "2". Rub patch adhesive wings for 2 additional minutes.  While looking in a mirror, press and release button in center of patch. A small green light will  flash 3-4 times. This will be your only indicator that the monitor has been turned on.  Do not shower for the first 24 hours. You may shower after the first 24 hours.  Press the button if you feel a symptom. You will hear a small click. Record Date, Time and  Symptom in the Patient Logbook.  When you are ready to remove the patch, follow instructions on the last 2 pages of Patient  Logbook. Stick patch monitor onto the last page of Patient Logbook.  Place Patient Logbook in the blue and white box. Use locking tab on box and  tape box closed  securely. The blue and white box has prepaid postage on it. Please place it in the mailbox as  soon as possible. Your physician should have your test results approximately 7 days after the  monitor has been mailed back to [facility].  Call [facility] Customer Care at [phone] if you have questions regarding  your ZIO XT patch monitor. Call them immediately if you see an orange light blinking on your  monitor.  If your monitor falls off in less than 4 days, contact our Monitor department at [phone].  If your monitor becomes loose or falls off after 4 days call [employer] at [phone] for  suggestions on securing your monitor    Signed, Smitty Knudsen  01/19/2023 12:01 PM    [facility]

## 2023-01-16 ENCOUNTER — Other Ambulatory Visit: Payer: Self-pay | Admitting: Internal Medicine

## 2023-01-16 ENCOUNTER — Encounter: Payer: Self-pay | Admitting: Internal Medicine

## 2023-01-16 NOTE — Progress Notes (Unsigned)
Subjective:    Patient ID: Todd [name], male    DOB: 11-22-1940, 82 y.o.   MRN: [id number]     HPI Todd [name] is here for follow up from the hospital.    Admitted 5/20 - 5/21 for NSTEMI  Presented with afib RVR and elevated troponin.  The day prior he had chest pressure - went away -came back the day of admission.  Started on dilt drip in ED for afib RVR.  Trop 3228 - > 5977, BNP 790, EKG showed TWI.  Denied SOB.    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  Current Outpatient Medications on File Prior to Visit  Medication Sig Dispense Refill   acetaminophen (TYLENOL) 650 MG CR tablet Take 650 mg by mouth every 8 (eight) hours as needed for pain.     albuterol (VENTOLIN HFA) 108 (90 Base) MCG/ACT inhaler Inhale 2 puffs into the lungs every 6 (six) hours as needed for wheezing or shortness of breath. (Patient not taking: Reported on 01/11/2023) 8 g 6   amitriptyline (ELAVIL) 10 MG tablet Take 1 tablet (10 mg total) by mouth at bedtime.     aspirin EC 81 MG tablet Take 1 tablet (81 mg total) by mouth daily. Swallow whole. 120 tablet 3   atorvastatin (LIPITOR) 80 MG tablet Take 1 tablet (80 mg total) by mouth daily. 90 tablet 3   cholecalciferol (VITAMIN D3) 25 MCG (1000 UNIT)  tablet Take 1,000 Units by mouth daily.     Continuous Blood Gluc Receiver (FREESTYLE LIBRE 14 DAY READER) DEVI UAD to check sugars.  E11.9 (Patient not taking: Reported on 01/11/2023) 1 each 0   Continuous Blood Gluc Sensor (FREESTYLE LIBRE 14 DAY SENSOR) MISC UAD to check sugars.  E11.9 (Patient not taking: Reported on 01/11/2023) 2 each 5   dapagliflozin propanediol ([name]) 10 MG TABS tablet Take 1 tablet (10 mg total) by mouth daily before breakfast. 30 tablet 5   famotidine (PEPCID) 40 MG tablet Take 2 tablets (80 mg total) by mouth at bedtime.     fexofenadine (ALLEGRA) 180 MG tablet Take 180 mg by mouth at bedtime.     glipiZIDE (GLUCOTROL) 5 MG tablet TAKE 2 TABLETS BY MOUTH WITH BREAKFAST AND 1 TABLET WITH DINNER. 270 tablet 1   ipratropium (ATROVENT) 0.06 % nasal spray Place 2-4 sprays into both nostrils every 8 (eight) hours as needed for rhinitis.     ipratropium-albuterol (DUONEB) 0.5-2.5 (3) MG/3ML SOLN Take 3 mLs by nebulization every 4 (four) hours as needed. (Patient not taking: Reported on 01/11/2023) 360 mL 3   metFORMIN (GLUCOPHAGE-XR) 500 MG 24 hr tablet 2 tabs with breakfast, 1 tab with lunch, 2 tabs with dinner  0   metoprolol tartrate (LOPRESSOR) 50 MG tablet Take 1 tablet (50 mg total) by  mouth 2 (two) times daily. 180 tablet 3   Multiple Vitamin (MULTIVITAMIN PO) Take 1 tablet by mouth daily.     Multiple Vitamins-Minerals (ZINC PO) Take 1 tablet by mouth daily.     Nintedanib (OFEV) 100 MG CAPS Take 1 capsule (100 mg total) by mouth 2 (two) times daily. 180 capsule 1   nitroGLYCERIN (NITROSTAT) 0.4 MG SL tablet Place 1 tablet (0.4 mg total) under the tongue every 5 (five) minutes as needed for chest pain. 100 tablet 3   Omega-3 Fatty Acids (FISH OIL PO) Take 1 tablet by mouth 2 (two) times daily.     omeprazole (PRILOSEC) 40 MG capsule Take 1 capsule (40 mg total) by mouth daily.     ONETOUCH DELICA LANCETS 33G MISC TEST BLOOD SUGAR ONCE DAILY 100 each 3   ONETOUCH VERIO  test strip USE UP TO 4 TIMES A DAY AS DIRECTED 100 strip 0   predniSONE (DELTASONE) 10 MG tablet Take 1 tablet (10 mg total) by mouth daily with breakfast. 30 tablet 2   rivaroxaban (XARELTO) 20 MG TABS tablet Take 1 tablet (20 mg total) by mouth daily with supper. 90 tablet 3   No current facility-administered medications on file prior to visit.     Review of Systems     Objective:  There were no vitals filed for this visit. BP Readings from Last 3 Encounters:  01/09/23 116/71  12/29/22 130/76  12/18/22 132/78   Wt Readings from Last 3 Encounters:  01/09/23 154 lb 5.2 oz (70 kg)  12/29/22 159 lb 3.2 oz (72.2 kg)  12/18/22 160 lb (72.6 kg)   There is no height or weight on file to calculate BMI.    Physical Exam     Lab Results  Component Value Date   WBC 6.9 01/09/2023   HGB 11.8 (L) 01/09/2023   HCT 34.6 (L) 01/09/2023   PLT 150 01/09/2023   GLUCOSE 141 (H) 01/08/2023   CHOL 125 01/08/2023   TRIG 152 (H) 01/08/2023   HDL 36 (L) 01/08/2023   LDLCALC 59 01/08/2023   ALT 17 12/18/2022   AST 26 12/18/2022   NA 137 01/08/2023   K 3.7 01/08/2023   CL 102 01/08/2023   CREATININE 0.77 01/08/2023   BUN 17 01/08/2023   CO2 23 01/08/2023   TSH 3.05 12/18/2022   PSA 0.24 07/20/2008   INR 1.2 01/08/2023   HGBA1C 8.1 (H) 12/18/2022   MICROALBUR <0.7 02/07/2022   CARDIAC CATHETERIZATION   Ost Cx to Mid Cx lesion is 70% stenosed.   1st Mrg lesion is 100% stenosed.   3rd Mrg lesion is 100% stenosed.   Prox LAD to Mid LAD lesion is 100% stenosed.   Ost RCA to Prox RCA lesion is 100% stenosed.   Dist LAD lesion is 70% stenosed.   Origin to Prox Graft lesion is 65% stenosed.   LIMA graft was visualized by angiography and is large.   SVG graft was visualized by angiography and is normal in caliber.   The graft exhibits no disease.   LV end diastolic pressure is normal.  3 vessel occlusive CAD.  Patent LIMA to the LAD. The are some collaterals to a first OM Patent SVG  to the second OM. There is a smooth moderate stenosis in the  proximal SVG 65-70%. Patent SVG to the RCA Normal LVEDP  Plan: there is a discrepancy between chart records and what we are seeing  on cath. No old Bypass note available. Chart  records indicate there were  SVG to OM1, SVG to OM2, SVG to RCA and LIMA to LAD. In fact the patient  did have a radial graft used. There are only 2 graft markers in the aorta.  The graft to the OMs may have been a sequential graft but now only OM2  supplied. There is modest disease in the SVG to OM but this does not  appear like an acute lesion and I suspect his current presentation is due  to demand ischemia in setting of AFib with RVR. If he has refractory  angina despite optimal management of his arrhythmia we could consider PCI  of the SVG to OM.     Assessment & Plan:    See Problem List for Assessment and Plan of chronic medical problems.

## 2023-01-16 NOTE — Patient Instructions (Addendum)
        Medications changes include :   none      Return for follow up as scheduled.  

## 2023-01-17 ENCOUNTER — Ambulatory Visit (INDEPENDENT_AMBULATORY_CARE_PROVIDER_SITE_OTHER): Payer: PPO | Admitting: Internal Medicine

## 2023-01-17 VITALS — BP 126/82 | HR 80 | Temp 98.4°F | Ht 68.0 in | Wt 153.0 lb

## 2023-01-17 DIAGNOSIS — R053 Chronic cough: Secondary | ICD-10-CM | POA: Diagnosis not present

## 2023-01-17 DIAGNOSIS — I4891 Unspecified atrial fibrillation: Secondary | ICD-10-CM

## 2023-01-17 DIAGNOSIS — J8410 Pulmonary fibrosis, unspecified: Secondary | ICD-10-CM | POA: Diagnosis not present

## 2023-01-17 DIAGNOSIS — E7849 Other hyperlipidemia: Secondary | ICD-10-CM

## 2023-01-17 DIAGNOSIS — I10 Essential (primary) hypertension: Secondary | ICD-10-CM | POA: Diagnosis not present

## 2023-01-17 DIAGNOSIS — Z7984 Long term (current) use of oral hypoglycemic drugs: Secondary | ICD-10-CM | POA: Diagnosis not present

## 2023-01-17 DIAGNOSIS — E1151 Type 2 diabetes mellitus with diabetic peripheral angiopathy without gangrene: Secondary | ICD-10-CM | POA: Diagnosis not present

## 2023-01-17 DIAGNOSIS — I214 Non-ST elevation (NSTEMI) myocardial infarction: Secondary | ICD-10-CM | POA: Diagnosis not present

## 2023-01-17 NOTE — Assessment & Plan Note (Addendum)
Chronic   Lab Results  Component Value Date   HGBA1C 8.1 (H) 12/18/2022   Sugars not controlled per last A1c Sugars have been controlled at home-a couple of low sugars He will monitor sugars closely and update me with his readings  Continue metformin 1000 mg twice daily, glipizide 10 mg with breakfast, 1 pill with dinner and [name] 10 mg daily which was added after his last A1c Stressed regular exercise, diabetic diet

## 2023-01-17 NOTE — Assessment & Plan Note (Signed)
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 OFEV helping - ? Stay on it, ? Anything else for cough would like to see Dr. Peyton Najjar ordered

## 2023-01-17 NOTE — Assessment & Plan Note (Signed)
Chronic Following with cardiology On xarelto and metoprolol CBC, CMP, tsh

## 2023-01-17 NOTE — Assessment & Plan Note (Signed)
Chronic At least 50% better with steroids and was better when he was using the DuoNeb nebulizer treatments at home, but that caused the atrial fibrillation and is not used since then Would like a second opinion regarding chronic cough and pulmonary fibrosis Have requested to see Dr. Peyton Najjar ordered Advised to discuss with pulmonary about nebulizers/inhalers less likely to cause atrial fibrillation-Xopenex/ipratropium

## 2023-01-17 NOTE — Assessment & Plan Note (Addendum)
Chronic Blood pressure well controlled Continue metoprolol 50 mg twice daily

## 2023-01-17 NOTE — Assessment & Plan Note (Addendum)
Chronic Continue atorvastatin 80 mg daily Regular exercise and healthy diet encouraged  

## 2023-01-17 NOTE — Assessment & Plan Note (Signed)
Likely related to demand ischemia in setting of A-fib with RVR Cardiac cath without severe stenosis Medical therapy recommended Continue aspirin 81 mg daily, atorvastatin 80 mg daily, metoprolol 50 mg twice daily, Xarelto 20 mg daily

## 2023-01-18 ENCOUNTER — Telehealth: Payer: Self-pay

## 2023-01-18 NOTE — Telephone Encounter (Signed)
Copied from CRM [id number]. Topic: Appointment Scheduling - Scheduling Inquiry for Clinic >> Jan 18, 2023  8:48 AM Clide Dales wrote: Patient's daughter called to schedule patient a pulmonology appointment with Dr. Delford Field. Patient's PCP sent a referral over yesterday. Advised patient's daughter that Dr. Delford Field would need to make the decision to accept patient for specialty visit and office would give a callback.

## 2023-01-19 ENCOUNTER — Ambulatory Visit: Payer: PPO | Attending: Student | Admitting: Student

## 2023-01-19 ENCOUNTER — Encounter: Payer: Self-pay | Admitting: Student

## 2023-01-19 ENCOUNTER — Ambulatory Visit (INDEPENDENT_AMBULATORY_CARE_PROVIDER_SITE_OTHER): Payer: PPO

## 2023-01-19 VITALS — BP 106/70 | HR 89 | Ht 68.0 in | Wt 153.8 lb

## 2023-01-19 DIAGNOSIS — E785 Hyperlipidemia, unspecified: Secondary | ICD-10-CM | POA: Diagnosis not present

## 2023-01-19 DIAGNOSIS — J8410 Pulmonary fibrosis, unspecified: Secondary | ICD-10-CM | POA: Diagnosis not present

## 2023-01-19 DIAGNOSIS — I480 Paroxysmal atrial fibrillation: Secondary | ICD-10-CM

## 2023-01-19 DIAGNOSIS — E118 Type 2 diabetes mellitus with unspecified complications: Secondary | ICD-10-CM | POA: Diagnosis not present

## 2023-01-19 DIAGNOSIS — I2510 Atherosclerotic heart disease of native coronary artery without angina pectoris: Secondary | ICD-10-CM | POA: Diagnosis not present

## 2023-01-19 DIAGNOSIS — R5383 Other fatigue: Secondary | ICD-10-CM

## 2023-01-19 DIAGNOSIS — Z951 Presence of aortocoronary bypass graft: Secondary | ICD-10-CM

## 2023-01-19 DIAGNOSIS — I10 Essential (primary) hypertension: Secondary | ICD-10-CM | POA: Diagnosis not present

## 2023-01-19 DIAGNOSIS — I6529 Occlusion and stenosis of unspecified carotid artery: Secondary | ICD-10-CM

## 2023-01-19 DIAGNOSIS — Z7984 Long term (current) use of oral hypoglycemic drugs: Secondary | ICD-10-CM | POA: Diagnosis not present

## 2023-01-19 NOTE — Patient Instructions (Signed)
Medication Instructions:  The current medical regimen is effective;  continue present plan and medications as directed. Please refer to the Current Medication list given to you today.  *If you need a refill on your cardiac medications before your next appointment, please call your pharmacy*  Lab Work: FASTING LIPID AND LFT IN 8 WEEKS If you have labs (blood work) drawn today and your tests are completely normal, you will receive your results only by: MyChart Message (if you have MyChart) OR A paper copy in the mail If you have any lab test that is abnormal or we need to change your treatment, we will call you to review the results.  Testing/Procedures: NONE  Follow-Up: At [facility], you and your health needs are our priority.  As part of our continuing mission to provide you with exceptional heart care, we have created designated Provider Care Teams.  These Care Teams include your primary Cardiologist (physician) and Advanced Practice Providers (APPs -  Physician Assistants and Nurse Practitioners) who all work together to provide you with the care you need, when you need it.  Your next appointment:   4-6 month(s)  Provider:   Rollene Rotunda, MD  or [name], PA-C        Other Instructions  Todd [name]- Long Term Monitor Instructions  Your physician has requested you wear a ZIO patch monitor for 14 days.  This is a single patch monitor. [employer] supplies one patch monitor per enrollment. Additional stickers are not available. Please do not apply patch if you will be having a Nuclear Stress Test,  Echocardiogram, Cardiac CT, MRI, or Chest Xray during the period you would be wearing the  monitor. The patch cannot be worn during these tests. You cannot remove and re-apply the  ZIO XT patch monitor.  Your ZIO patch monitor will be mailed 3 day USPS to your address on file. It may take 3-5 days  to receive your monitor after you have been enrolled.  Once you have received  your monitor, please review the enclosed instructions. Your monitor  has already been registered assigning a specific monitor serial # to you.  Billing and Patient Assistance Program Information  We have supplied [employer] with any of your insurance information on file for billing purposes. [employer] offers a sliding scale Patient Assistance Program for patients that do not have  insurance, or whose insurance does not completely cover the cost of the ZIO monitor.  You must apply for the Patient Assistance Program to qualify for this discounted rate.  To apply, please call [employer] at [phone], select option 4, select option 2, ask to apply for  Patient Assistance Program. [name] will ask your household income, and how many people  are in your household. They will quote your out-of-pocket cost based on that information.  [employer] will also be able to set up a 30-month, interest-free payment plan if needed.  Applying the monitor   Shave hair from upper left chest.  Hold abrader disc by orange tab. Rub abrader in 40 strokes over the upper left chest as  indicated in your monitor instructions.  Clean area with 4 enclosed alcohol pads. Let dry.  Apply patch as indicated in monitor instructions. Patch will be placed under collarbone on left  side of chest with arrow pointing upward.  Rub patch adhesive wings for 2 minutes. Remove white label marked "1". Remove the white  label marked "2". Rub patch adhesive wings for 2 additional minutes.  While looking in  a mirror, press and release button in center of patch. A small green light will  flash 3-4 times. This will be your only indicator that the monitor has been turned on.  Do not shower for the first 24 hours. You may shower after the first 24 hours.  Press the button if you feel a symptom. You will hear a small click. Record Date, Time and  Symptom in the Patient Logbook.  When you are ready to remove the patch, follow instructions on the last  2 pages of Patient  Logbook. Stick patch monitor onto the last page of Patient Logbook.  Place Patient Logbook in the blue and white box. Use locking tab on box and tape box closed  securely. The blue and white box has prepaid postage on it. Please place it in the mailbox as  soon as possible. Your physician should have your test results approximately 7 days after the  monitor has been mailed back to [facility].  Call [facility] Customer Care at [phone] if you have questions regarding  your ZIO XT patch monitor. Call them immediately if you see an orange light blinking on your  monitor.  If your monitor falls off in less than 4 days, contact our Monitor department at [phone].  If your monitor becomes loose or falls off after 4 days call [employer] at [phone] for  suggestions on securing your monitor

## 2023-01-19 NOTE — Telephone Encounter (Signed)
She needs to go through [facility] [facility]

## 2023-01-19 NOTE — Progress Notes (Unsigned)
Enrolled patient for a 7 day Zio XT monitor to be mailed to patients home  [place] to read

## 2023-01-22 ENCOUNTER — Encounter: Payer: Self-pay | Admitting: Pulmonary Disease

## 2023-01-23 NOTE — Telephone Encounter (Signed)
Message received from [name]'s daughter.   Dr Isaiah Serge- I'm writing on behalf of my father Todd [name]. The nebulizer you started him on his last visit seemed to really be helping his cough but after approx. 5 days his heart started racing and he went into afib and ended up in the hospital with a mild heart attack due to the nebulizer and the prednisone combination. The thing is the nebulizer and the prednisone (10 mg) was really helping the cough but he could not take the nebulizer anymore due to his heart condition. He is still taking the prednisone. So, Dr Lawerance Bach and the cardiologist asked me to send you a note to see if there is something else you could try that would not cause the heart to act up again. We sent the nebulizer back btw. He still has the appt scheduled to see Dr Delford Field (ENT) in [place] with Todd [name] for his chronic cough but not until July. Could you please advise on what steps we could take? It's a shame the one thing that was helping his cough caused a totally different health issue. Thank you-   Todd [name] daughter)    Dr. Isaiah Serge please advise

## 2023-01-23 NOTE — Telephone Encounter (Signed)
Unfortunately most of the nebulizers will have the same issue with heart rate.  I would advise to continue the prednisone for now.

## 2023-01-24 ENCOUNTER — Telehealth: Payer: Self-pay | Admitting: Internal Medicine

## 2023-01-24 ENCOUNTER — Other Ambulatory Visit: Payer: Self-pay | Admitting: Internal Medicine

## 2023-01-24 MED ORDER — GLIPIZIDE 5 MG PO TABS: ORAL_TABLET | ORAL | 1 refills | Status: DC

## 2023-01-24 NOTE — Telephone Encounter (Signed)
Daughter called back and stated that they went through [facility] already and was wanting a second opinion

## 2023-01-24 NOTE — Telephone Encounter (Signed)
Called patient daughter(who is on DPR) and left voicemail

## 2023-01-24 NOTE — Telephone Encounter (Signed)
Copied from CRM [id number]. Topic: General - Other >> Jan 24, 2023  4:33 PM Carrielelia G wrote: Patients daughter returning  [place], [place], [place]  call  (In regards to scheduling an appt with Dr Maryjean Ka )

## 2023-01-25 DIAGNOSIS — I480 Paroxysmal atrial fibrillation: Secondary | ICD-10-CM

## 2023-01-25 NOTE — Telephone Encounter (Signed)
Call received from patient's daughter, Cala Bradford.  I explained Dr Lynelle Doctor recommendation for a second opinion and she appreciated the recommendation

## 2023-01-25 NOTE — Telephone Encounter (Signed)
Let pt daughter know I no longer practice lung fibrosis specialty care.  I would recommend [facility] in [place] for a second opinion   I only do primary care internal medicine at this time   Adding Erskine Squibb

## 2023-01-25 NOTE — Telephone Encounter (Signed)
Call placed to patient's daughter, Cala Bradford, message left with call back requested.    Need to inform her that Dr Delford Field no longer practice lung fibrosis specialty care.   He would recommend [facility] in [place] for a second opinion.  He only does primary care internal medicine at this time

## 2023-02-05 DIAGNOSIS — I480 Paroxysmal atrial fibrillation: Secondary | ICD-10-CM | POA: Diagnosis not present

## 2023-02-13 ENCOUNTER — Other Ambulatory Visit: Payer: Self-pay | Admitting: Pharmacist

## 2023-02-13 DIAGNOSIS — E119 Type 2 diabetes mellitus without complications: Secondary | ICD-10-CM | POA: Diagnosis not present

## 2023-02-13 DIAGNOSIS — I2510 Atherosclerotic heart disease of native coronary artery without angina pectoris: Secondary | ICD-10-CM | POA: Diagnosis not present

## 2023-02-13 DIAGNOSIS — Z8679 Personal history of other diseases of the circulatory system: Secondary | ICD-10-CM | POA: Diagnosis not present

## 2023-02-13 DIAGNOSIS — J849 Interstitial pulmonary disease, unspecified: Secondary | ICD-10-CM

## 2023-02-13 DIAGNOSIS — Z7984 Long term (current) use of oral hypoglycemic drugs: Secondary | ICD-10-CM | POA: Diagnosis not present

## 2023-02-13 DIAGNOSIS — I10 Essential (primary) hypertension: Secondary | ICD-10-CM | POA: Diagnosis not present

## 2023-02-13 DIAGNOSIS — Z6823 Body mass index (BMI) 23.0-23.9, adult: Secondary | ICD-10-CM | POA: Diagnosis not present

## 2023-02-13 MED ORDER — OFEV 100 MG PO CAPS
100.0000 mg | ORAL_CAPSULE | Freq: Two times a day (BID) | ORAL | 1 refills | Status: DC
Start: 2023-02-13 — End: 2023-05-14

## 2023-02-13 NOTE — Telephone Encounter (Signed)
Refill sent for OFEV to [facility] Cares for Ofev: [phone]  Dose: 100 mg twice daily  Last OV: 12/29/2022 Provider: Dr. Isaiah Serge  LFTs on 12/18/22 wnl  Next OV: 04/04/23  Chesley Mires, PharmD, MPH, BCPS Clinical Pharmacist (Rheumatology and Pulmonology)

## 2023-02-15 ENCOUNTER — Telehealth: Payer: Self-pay | Admitting: Cardiology

## 2023-02-15 ENCOUNTER — Other Ambulatory Visit: Payer: Self-pay

## 2023-02-15 DIAGNOSIS — I10 Essential (primary) hypertension: Secondary | ICD-10-CM

## 2023-02-15 DIAGNOSIS — I2510 Atherosclerotic heart disease of native coronary artery without angina pectoris: Secondary | ICD-10-CM

## 2023-02-15 DIAGNOSIS — E785 Hyperlipidemia, unspecified: Secondary | ICD-10-CM

## 2023-02-15 DIAGNOSIS — E118 Type 2 diabetes mellitus with unspecified complications: Secondary | ICD-10-CM

## 2023-02-15 DIAGNOSIS — I4891 Unspecified atrial fibrillation: Secondary | ICD-10-CM

## 2023-02-15 MED ORDER — METOPROLOL TARTRATE 75 MG PO TABS: 75.0000 mg | ORAL_TABLET | Freq: Two times a day (BID) | ORAL | 4 refills | Status: DC

## 2023-02-15 NOTE — Telephone Encounter (Signed)
Pt c/o medication issue:  1. Name of Medication: metoprolol tartrate 75 MG TABS   2. How are you currently taking this medication (dosage and times per day)?   3. Are you having a reaction (difficulty breathing--STAT)?   4. What is your medication issue? Pharmacy is calling to verify quantity of medication sent in and instructions. They would like to verify that the patient will only be getting the 15 day quantity

## 2023-02-15 NOTE — Telephone Encounter (Signed)
Per OV note on 01/19/23 w/Callie Irene Limbo, PA: Paroxysmal Atrial Fibrillation Recently admitted with rapid atrial fibrillation but converted with IV Diltiazem. Echo showed normal LV function. - Patient reports intermittent palpitations since discharge. - EKG today shows normal sinus rhythm with frequent PACs.  - Continue Lopressor 50mg  twice daily.  - Continue chronic anticoagulation with Xarelto 20mg  daily. Provided samples of this today.  - Will order a 1 week Zio monitor to assess for recurrent atrial fibrillation given ongoing intermittent palpitations.  Monitor results:    Corrin Parker, PA-C 02/09/2023  8:48 AM EDT     Please call patient with the results: Monitor showed he continues to have episodes of atrial fibrillation/ flutter with the longest run lasting about 15 hours with an elevated heart rate. It also showed frequent premature beats from the top of the heart and very short runs of non-sustained VT (a fast heart rhythm coming from the bottom of the heart) with the longest episode lasting 4 beats. These episodes of atrial fibrillation/ flutter may be contributing to some of the fatigue he has been having. Let's increase his Lopressor to 75mg  twice daily to see if hat helps some. We could also consider adding an antiarrhythmic medication to help keep him in normal rhythm; however, if he would like to do this I would recommend we get him into the A.Fib Clinic to discuss the best option given his pulmonary fibrosis.   Thank you!    MAR shows that RX was sent in for #30 tablets Metoprolol tartrate 75mg  with 4 refills. Pharmacy questioning days supply of rx. Will send in new rx for correct amount to last patient for one month (#60) and same number of refills.

## 2023-02-15 NOTE — Telephone Encounter (Signed)
Patient's wife is returning call. Transferred to Maralyn Sago, Charity fundraiser.

## 2023-02-15 NOTE — Telephone Encounter (Signed)
Spoke with wife to make her aware that I had corrected RX for Metoprolol that was sent to pharmacy. She verbalized understanding

## 2023-03-13 ENCOUNTER — Other Ambulatory Visit (HOSPITAL_COMMUNITY): Payer: Self-pay

## 2023-03-16 DIAGNOSIS — I2510 Atherosclerotic heart disease of native coronary artery without angina pectoris: Secondary | ICD-10-CM | POA: Diagnosis not present

## 2023-03-16 DIAGNOSIS — E785 Hyperlipidemia, unspecified: Secondary | ICD-10-CM | POA: Diagnosis not present

## 2023-03-16 DIAGNOSIS — I480 Paroxysmal atrial fibrillation: Secondary | ICD-10-CM | POA: Diagnosis not present

## 2023-03-19 DIAGNOSIS — J387 Other diseases of larynx: Secondary | ICD-10-CM | POA: Diagnosis not present

## 2023-03-19 DIAGNOSIS — R1314 Dysphagia, pharyngoesophageal phase: Secondary | ICD-10-CM | POA: Diagnosis not present

## 2023-03-19 DIAGNOSIS — J383 Other diseases of vocal cords: Secondary | ICD-10-CM | POA: Insufficient documentation

## 2023-03-19 DIAGNOSIS — R053 Chronic cough: Secondary | ICD-10-CM | POA: Diagnosis not present

## 2023-03-19 DIAGNOSIS — R6889 Other general symptoms and signs: Secondary | ICD-10-CM | POA: Diagnosis not present

## 2023-03-19 DIAGNOSIS — J385 Laryngeal spasm: Secondary | ICD-10-CM | POA: Diagnosis not present

## 2023-03-20 DIAGNOSIS — I10 Essential (primary) hypertension: Secondary | ICD-10-CM

## 2023-03-26 DIAGNOSIS — R1314 Dysphagia, pharyngoesophageal phase: Secondary | ICD-10-CM | POA: Diagnosis not present

## 2023-03-26 DIAGNOSIS — R1313 Dysphagia, pharyngeal phase: Secondary | ICD-10-CM | POA: Insufficient documentation

## 2023-03-26 DIAGNOSIS — J385 Laryngeal spasm: Secondary | ICD-10-CM | POA: Diagnosis not present

## 2023-03-26 DIAGNOSIS — R053 Chronic cough: Secondary | ICD-10-CM | POA: Diagnosis not present

## 2023-03-26 DIAGNOSIS — R1310 Dysphagia, unspecified: Secondary | ICD-10-CM | POA: Diagnosis not present

## 2023-03-26 DIAGNOSIS — J387 Other diseases of larynx: Secondary | ICD-10-CM | POA: Diagnosis not present

## 2023-03-26 DIAGNOSIS — J383 Other diseases of vocal cords: Secondary | ICD-10-CM | POA: Diagnosis not present

## 2023-03-26 DIAGNOSIS — R6889 Other general symptoms and signs: Secondary | ICD-10-CM | POA: Diagnosis not present

## 2023-03-28 ENCOUNTER — Ambulatory Visit (HOSPITAL_COMMUNITY)
Admission: RE | Admit: 2023-03-28 | Discharge: 2023-03-28 | Disposition: A | Discharge status: Home / Self Care | Payer: PPO | Source: Ambulatory Visit | Attending: Pulmonary Disease | Admitting: Pulmonary Disease

## 2023-03-28 DIAGNOSIS — J849 Interstitial pulmonary disease, unspecified: Secondary | ICD-10-CM | POA: Diagnosis not present

## 2023-03-28 DIAGNOSIS — R918 Other nonspecific abnormal finding of lung field: Secondary | ICD-10-CM | POA: Diagnosis not present

## 2023-03-28 DIAGNOSIS — R590 Localized enlarged lymph nodes: Secondary | ICD-10-CM | POA: Diagnosis not present

## 2023-04-01 ENCOUNTER — Emergency Department (HOSPITAL_COMMUNITY): Payer: PPO

## 2023-04-01 ENCOUNTER — Encounter (HOSPITAL_COMMUNITY): Payer: Self-pay

## 2023-04-01 ENCOUNTER — Emergency Department (HOSPITAL_COMMUNITY)
Admission: EM | Admit: 2023-04-01 | Discharge: 2023-04-01 | Disposition: A | Discharge status: Home / Self Care | Payer: PPO | Attending: Emergency Medicine | Admitting: Emergency Medicine

## 2023-04-01 ENCOUNTER — Other Ambulatory Visit: Payer: Self-pay

## 2023-04-01 DIAGNOSIS — Z7982 Long term (current) use of aspirin: Secondary | ICD-10-CM | POA: Diagnosis not present

## 2023-04-01 DIAGNOSIS — I2510 Atherosclerotic heart disease of native coronary artery without angina pectoris: Secondary | ICD-10-CM | POA: Insufficient documentation

## 2023-04-01 DIAGNOSIS — I4891 Unspecified atrial fibrillation: Secondary | ICD-10-CM | POA: Insufficient documentation

## 2023-04-01 DIAGNOSIS — E1165 Type 2 diabetes mellitus with hyperglycemia: Secondary | ICD-10-CM | POA: Diagnosis not present

## 2023-04-01 DIAGNOSIS — Z7984 Long term (current) use of oral hypoglycemic drugs: Secondary | ICD-10-CM | POA: Diagnosis not present

## 2023-04-01 DIAGNOSIS — R739 Hyperglycemia, unspecified: Secondary | ICD-10-CM | POA: Insufficient documentation

## 2023-04-01 DIAGNOSIS — Z7901 Long term (current) use of anticoagulants: Secondary | ICD-10-CM | POA: Insufficient documentation

## 2023-04-01 DIAGNOSIS — R918 Other nonspecific abnormal finding of lung field: Secondary | ICD-10-CM | POA: Diagnosis not present

## 2023-04-01 DIAGNOSIS — R600 Localized edema: Secondary | ICD-10-CM | POA: Diagnosis not present

## 2023-04-01 LAB — BASIC METABOLIC PANEL
Anion gap: 14 (ref 5–15)
BUN: 31 mg/dL — ABNORMAL HIGH (ref 8–23)
CO2: 21 mmol/L — ABNORMAL LOW (ref 22–32)
Calcium: 9.3 mg/dL (ref 8.9–10.3)
Chloride: 101 mmol/L (ref 98–111)
Creatinine, Ser: 1.17 mg/dL (ref 0.61–1.24)
GFR, Estimated: >60 mL/min (ref >60)
Glucose, Bld: 352 mg/dL — ABNORMAL HIGH (ref 70–99)
Potassium: 4.3 mmol/L (ref 3.5–5.1)
Sodium: 136 mmol/L (ref 135–145)

## 2023-04-01 LAB — TROPONIN I (HIGH SENSITIVITY)
Troponin I (High Sensitivity): 555 ng/L (ref <18)
Troponin I (High Sensitivity): 531 ng/L (ref <18)

## 2023-04-01 LAB — CBC
HCT: 38.9 % — ABNORMAL LOW (ref 39.0–52.0)
Hemoglobin: 13 g/dL (ref 13.0–17.0)
MCH: 36.4 pg — ABNORMAL HIGH (ref 26.0–34.0)
MCHC: 33.4 g/dL (ref 30.0–36.0)
MCV: 109 fL — ABNORMAL HIGH (ref 80.0–100.0)
Platelets: 174 10*3/uL (ref 150–400)
RBC: 3.57 MIL/uL — ABNORMAL LOW (ref 4.22–5.81)
RDW: 17.2 % — ABNORMAL HIGH (ref 11.5–15.5)
WBC: 9.7 10*3/uL (ref 4.0–10.5)
nRBC: 0.2 % (ref 0.0–0.2)

## 2023-04-01 LAB — CBG MONITORING, ED: Glucose-Capillary: 256 mg/dL — ABNORMAL HIGH (ref 70–99)

## 2023-04-01 MED ORDER — DILTIAZEM HCL-DEXTROSE 125-5 MG/125ML-% IV SOLN (PREMIX)
5.0000 mg/h | INTRAVENOUS | Status: DC
  Filled 2023-04-01: qty 125

## 2023-04-01 MED ORDER — DILTIAZEM LOAD VIA INFUSION
10.0000 mg | Freq: Once | INTRAVENOUS | Status: DC
  Filled 2023-04-01: qty 10

## 2023-04-01 MED ORDER — SODIUM CHLORIDE 0.9 % IV BOLUS
500.0000 mL | Freq: Once | INTRAVENOUS | Status: AC
  Administered 2023-04-01: 500 mL via INTRAVENOUS

## 2023-04-01 NOTE — Discharge Instructions (Signed)
The A-fib clinic should call you about follow-up but if you do not hear from them in a few days, call for an appointment.  Make sure that you keep a close eye on your blood sugars at home.  Return to the emergency room if you have any worsening symptoms.

## 2023-04-01 NOTE — Plan of Care (Signed)
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   Similar presentation in 12/2022. During that hospitalization, required IV diltiazem and ultimately converted to sinus rhythm. LHC done which showed patent LIMA-LAD, patent SVG-RCA, 65-70% stenosis of SVG-OM2. Felt that presentation was secondary to demand ischemia from AF RVR and opted for medical therapy with beta blockade, anticoagulation. TTE also showed normal BiV function, no RMA.   Today, he presents with AF RVR and self converted to sinus rhythm without intervention. Asymptomatic now. Discussed case with Dr. Delmer Islam - agree with ED providers that he is safe to discharge home and we will arrange close cardiology follow up to better manage his paroxysmal AF outpatient. Patient also agreeable to this plan.

## 2023-04-01 NOTE — ED Provider Notes (Addendum)
[place] EMERGENCY DEPARTMENT AT [facility] Provider Note   CSN: [id number] Arrival date & time: 04/01/23  1549     History  Chief Complaint  Patient presents with   Atrial Fibrillation    Todd [name] is a 82 y.o. male.  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  He said this morning he felt like he went into A-fib.  He says he gets sweaty when this happens.  He denies any associated chest pain or shortness of breath.  No fevers.  No recent illnesses.  He says he has been compliant with his Xarelto.  He says he periodically has atrial fibrillation/palpitations but typically it resolves on its own.       Home Medications Prior to Admission medications   Medication Sig Start Date End Date Taking? Authorizing Provider  acetaminophen (TYLENOL) 650 MG CR tablet Take 650 mg by mouth every 8 (eight) hours as needed for pain.    [provider]  albuterol (VENTOLIN HFA) 108 (90 Base) MCG/ACT inhaler Inhale 2 puffs into the lungs every 6 (six) hours as needed for wheezing or shortness of breath. 04/19/22   Mannam, Colbert Coyer, MD  amitriptyline (ELAVIL) 10 MG tablet Take 1 tablet (10 mg total) by mouth at bedtime. 01/09/23   Hermelinda Dellen, MD  aspirin EC 81 MG tablet Take 1 tablet (81 mg total) by mouth daily. Swallow whole. 01/10/23   Hermelinda Dellen, MD  atorvastatin (LIPITOR) 80 MG tablet Take 1 tablet (80 mg total) by mouth daily. 01/10/23   Hermelinda Dellen, MD  cholecalciferol (VITAMIN D3) 25 MCG (1000 UNIT) tablet Take 1,000 Units by mouth daily.    [provider]  dapagliflozin propanediol ([name]) 10 MG TABS tablet Take 1 tablet (10 mg total) by mouth daily before breakfast. 12/21/22   Burns, [name], MD  famotidine (PEPCID) 40 MG tablet Take 2 tablets (80 mg total) by mouth at  bedtime. 01/09/23   Hermelinda Dellen, MD  fexofenadine (ALLEGRA) 180 MG tablet Take 180 mg by mouth at bedtime.    [provider]  glipiZIDE (GLUCOTROL) 5 MG tablet TAKE 2 TABLETS BY MOUTH WITH BREAKFAST 01/24/23   [name], MD  ipratropium (ATROVENT) 0.06 % nasal spray Place 2-4 sprays into both nostrils every 8 (eight) hours as needed for rhinitis. 12/04/22   [provider]  ipratropium-albuterol (DUONEB) 0.5-2.5 (3) MG/3ML SOLN Take 3 mLs by nebulization every 4 (four) hours as needed. Patient not taking: Reported on 01/19/2023 12/29/22   Chilton Greathouse, MD  metFORMIN (GLUCOPHAGE-XR) 500 MG 24 hr tablet 2 tabs with breakfast, 1 tab with lunch, 2 tabs with dinner 01/11/23   Hermelinda Dellen, MD  Metoprolol Tartrate 75 MG TABS Take 1 tablet (75 mg total) by mouth 2 (two) times daily. 02/15/23   Corrin Parker, PA-C  Multiple Vitamin (MULTIVITAMIN PO) Take 1 tablet by mouth daily.    [provider]  Multiple Vitamins-Minerals (ZINC PO) Take 1 tablet by mouth daily.    [provider]  Nintedanib (OFEV) 100 MG CAPS Take 1 capsule (100 mg total) by mouth 2 (two) times daily. 02/13/23   Mannam, Colbert Coyer, MD  nitroGLYCERIN (NITROSTAT) 0.4 MG SL tablet Place 1 tablet (0.4 mg total) under the tongue every 5 (five) minutes as needed for chest pain. 08/25/21 08/10/07  Rollene Rotunda, MD  Omega-3 Fatty Acids (FISH OIL PO) Take 1 tablet by mouth 2 (two) times daily.    [provider]  omeprazole (PRILOSEC) 40 MG capsule Take 1 capsule (40 mg total) by mouth daily. 01/09/23   Hermelinda Dellen, MD  [facility] DELICA LANCETS 33G MISC TEST BLOOD SUGAR ONCE DAILY 01/23/18   [name], MD  [facility] VERIO test strip USE UP TO 4 TIMES A DAY AS DIRECTED 07/06/21   [name], MD  predniSONE (DELTASONE) 10 MG tablet Take 1 tablet (10 mg total) by mouth daily with breakfast. 12/29/22   Mannam, Colbert Coyer, MD  rivaroxaban (XARELTO) 20 MG TABS tablet Take 1 tablet (20 mg  total) by mouth daily with supper. 08/28/22   Rollene Rotunda, MD      Allergies    Amlodipine besylate and Iodinated contrast media    Review of Systems   Review of Systems  Constitutional:  Negative for chills, diaphoresis, fatigue and fever.  HENT:  Negative for congestion, rhinorrhea and sneezing.   Eyes: Negative.   Respiratory:  Negative for cough, chest tightness and shortness of breath.   Cardiovascular:  Positive for palpitations. Negative for chest pain and leg swelling.  Gastrointestinal:  Negative for abdominal pain, blood in stool, diarrhea, nausea and vomiting.  Genitourinary:  Negative for difficulty urinating, flank pain, frequency and hematuria.  Musculoskeletal:  Negative for arthralgias and back pain.  Skin:  Negative for rash.  Neurological:  Negative for dizziness, speech difficulty, weakness, numbness and headaches.    Physical Exam Updated Vital Signs BP 113/63   Pulse 81   Temp 97.9 F (36.6 C) (Oral)   Resp (!) 26   Ht 5\' 8"  (1.727 m)   Wt 69.8 kg   SpO2 99%   BMI 23.40 kg/m  Physical Exam Constitutional:      Appearance: He is well-developed. He is diaphoretic.  HENT:     Head: Normocephalic and atraumatic.  Eyes:     Pupils: Pupils are equal, round, and reactive to light.  Cardiovascular:     Rate and Rhythm: Tachycardia present. Rhythm irregular.     Heart sounds: Normal heart sounds.  Pulmonary:     Effort: Pulmonary effort is normal. No respiratory distress.     Breath sounds: Normal breath sounds. No wheezing or rales.  Chest:     Chest wall: No tenderness.  Abdominal:     General: Bowel sounds are normal.     Palpations: Abdomen is soft.     Tenderness: There is no abdominal tenderness. There is no guarding or rebound.  Musculoskeletal:        General: Normal range of motion.     Cervical back: Normal range of motion and neck supple.     Comments: 2+ pitting edema to the left lower extremity, 3+ pitting edema to the right lower  extremity.  No warmth or erythema.  This is chronic swelling unchanged from his baseline per patient.  Lymphadenopathy:     Cervical: No cervical adenopathy.  Skin:    General: Skin is warm.     Findings: No rash.  Neurological:     Mental Status: He is alert and oriented to person, place, and time.     ED Results / Procedures / Treatments   Labs (all labs ordered are listed, but only abnormal results are displayed) Labs Reviewed  BASIC METABOLIC PANEL - Abnormal; Notable for the following components:      Result Value  CO2 21 (*)    Glucose, Bld 352 (*)    BUN 31 (*)    All other components within normal limits  CBC - Abnormal; Notable for the following components:   RBC 3.57 (*)    HCT 38.9 (*)    MCV 109.0 (*)    MCH 36.4 (*)    RDW 17.2 (*)    All other components within normal limits  CBG MONITORING, ED - Abnormal; Notable for the following components:   Glucose-Capillary 256 (*)    All other components within normal limits  TROPONIN I (HIGH SENSITIVITY) - Abnormal; Notable for the following components:   Troponin I (High Sensitivity) 555 (*)    All other components within normal limits  TROPONIN I (HIGH SENSITIVITY) - Abnormal; Notable for the following components:   Troponin I (High Sensitivity) 531 (*)    All other components within normal limits    EKG EKG Interpretation Date/Time:  Sunday April 01 2023 16:45:05 EDT Ventricular Rate:  89 PR Interval:  183 QRS Duration:  92 QT Interval:  366 QTC Calculation: 446 R Axis:   31  Text Interpretation: Age not entered, assumed to be  82 years old for purpose of ECG interpretation Sinus rhythm Abnormal T, consider ischemia, lateral leads Artifact in lead(s) I III aVR aVL aVF Confirmed by Rolan Bucco [phone]) on 04/01/2023 4:49:33 PM  Radiology DG Chest Port 1 View  Result Date: 04/01/2023 CLINICAL DATA:  Atrial fibrillation EXAM: PORTABLE CHEST 1 VIEW COMPARISON:  Chest radiograph dated 01/07/2023 FINDINGS:  Normal lung volumes. Increased diffuse left lung patchy opacities and peripheral right lung opacities. No pleural effusion or pneumothorax. Similar postsurgical cardiomediastinal silhouette. Median sternotomy wires are nondisplaced. IMPRESSION: Increased diffuse left lung patchy opacities and peripheral right lung opacities, which may represent superimposed infection or inflammation on a background of interstitial lung disease. Electronically Signed   By: Agustin Cree M.D.   On: 04/01/2023 17:17    Procedures Procedures    Medications Ordered in ED Medications  sodium chloride 0.9 % bolus 500 mL (500 mLs Intravenous New Bag/Given 04/01/23 1850)    ED Course/ Medical Decision Making/ A&P                                 Medical Decision Making Amount and/or Complexity of Data Reviewed Labs: ordered. Radiology: ordered.   Patient is a 82 year old male who presents with A-fib with RVR.  He has a history of this.  He is compliant with his Eliquis.  I ordered a dose of Cardizem.  However prior to this administration, he self converted to a sinus rhythm.  He was asymptomatic following this.  Labs reviewed.  His glucose is elevated although this has improved in the ED course.  He was given some IV fluids.  His troponins are elevated but stable.  I discussed with cardiology, Dr. Maximino Sarin, who has reviewed his chart and feels comfortable with patient being discharged.  He will arrange follow-up in the A-fib clinic.  Patient was discharged home in good condition.  Return precautions were given.  He was advised to keep frequent checks on his blood sugar at home and talk to his doctor if they remain elevated.  Chest x-ray was performed which was interpreted by me and confirmed by the radiologist to show increased interstitial markings.  He has a known history of pulmonary fibrosis.  He currently does not have any infectious symptoms.  Final Clinical Impression(s) / ED Diagnoses Final diagnoses:  Atrial  fibrillation with RVR (HCC)  Hyperglycemia    Rx / DC Orders ED Discharge Orders     None         Rolan Bucco, MD 04/01/23 2006    Rolan Bucco, MD 04/01/23 2007

## 2023-04-01 NOTE — ED Notes (Signed)
Cold ginger ale provided. Okay per MD.

## 2023-04-01 NOTE — ED Triage Notes (Signed)
Pt c/o palpitations and sweating started today. Pt is A-fib w/RVR in triage with hr up to 149. Pt denies chest pain and SOB

## 2023-04-02 ENCOUNTER — Telehealth: Payer: Self-pay | Admitting: Cardiology

## 2023-04-02 ENCOUNTER — Telehealth: Payer: Self-pay

## 2023-04-02 NOTE — Telephone Encounter (Signed)
Spoke with pt wife, she only wants the patient to see dr hochrein. Appointment 04/27/23 with dr hochrein scheduled.

## 2023-04-02 NOTE — Telephone Encounter (Signed)
Patient has a 4-6 week hosp f/u scheduled for 09/10 but he went back to the ED last night.Todd [name], [place] asked that we reach out to doctor to see if patient should be seen sooner, please advise.

## 2023-04-02 NOTE — Telephone Encounter (Signed)
Will route to MD to review notes. Per PA (notes in epic)   Ludwig Lean     04/02/23  6:45 AM Note Patient was seen at the emergency room last night for paroxysmal atrial fibrillation and self converted back to normal sinus rhythm with complete resolution of symptoms and asymptomatic now.  Patient was discharged from the emergency room.  Please reach out to patient to schedule 2-week follow-up.  He has follow-up in 4 weeks however likely needs to be sooner.  Please discuss with MD/APP if he needs to be seen sooner.

## 2023-04-02 NOTE — Telephone Encounter (Signed)
Patient was seen at the emergency room last night for paroxysmal atrial fibrillation and self converted back to normal sinus rhythm with complete resolution of symptoms and asymptomatic now.  Patient was discharged from the emergency room.  Please reach out to patient to schedule 2-week follow-up.  He has follow-up in 4 weeks however likely needs to be sooner.  Please discuss with MD/APP if he needs to be seen sooner.

## 2023-04-02 NOTE — Telephone Encounter (Signed)
Spoke with pts spouse. She was notified of lab results and pt has an appointment with Pharm D ok per Micah Flesher PA.

## 2023-04-04 ENCOUNTER — Ambulatory Visit: Payer: PPO | Admitting: Pulmonary Disease

## 2023-04-04 ENCOUNTER — Encounter: Payer: Self-pay | Admitting: Pulmonary Disease

## 2023-04-04 VITALS — BP 110/80 | HR 87 | Temp 97.8°F | Ht 68.0 in | Wt 150.2 lb

## 2023-04-04 DIAGNOSIS — Z5181 Encounter for therapeutic drug level monitoring: Secondary | ICD-10-CM | POA: Diagnosis not present

## 2023-04-04 DIAGNOSIS — J849 Interstitial pulmonary disease, unspecified: Secondary | ICD-10-CM | POA: Diagnosis not present

## 2023-04-04 NOTE — Progress Notes (Signed)
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 with history of allergies, rhinitis, atrial fibrillation, hypertension, hyperlipidemia, diabetes, GERD Follow-up for asbestosis, chronic cough, COVID-19 infection in September 2021  Developed COVID-19 in September 2021.  He was sick for about a week.  Treated with outpatient monoclonal antibody therapy and did not require hospitalization His cough has worsened since his COVID-19 infection.  He was put on prednisone for a month with slow taper with improvement in symptoms Continues to have persistent cough.  ENT examination in 2022 was normal  Started Esbriet in January 2023 for progressive pulmonary fibrosis.  But had to stop in 3 months after he developed significant sunburn. 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   He has been referred to Dr. Delford Field, ENT at [facility] for reevaluation of cough and is undergoing speech therapy  Start on duonebs but has been in A-fib requiring recent ED visit and is taken off the nebulizer.  Outpatient Encounter Medications as of 04/04/2023  Medication Sig   acetaminophen (TYLENOL) 650 MG CR tablet Take 650 mg by mouth every 8 (eight) hours as needed for pain.   albuterol (VENTOLIN HFA) 108 (90 Base) MCG/ACT inhaler Inhale 2 puffs into the lungs every 6 (six) hours as needed for wheezing or shortness of breath.   amitriptyline (ELAVIL) 10 MG tablet  Take 1 tablet (10 mg total) by mouth at bedtime.   aspirin EC 81 MG tablet Take 1 tablet (81 mg total) by mouth daily. Swallow whole.   atorvastatin (LIPITOR) 80 MG tablet Take 1 tablet (80 mg total) by mouth daily.   cholecalciferol (VITAMIN D3) 25 MCG (1000 UNIT) tablet Take 1,000 Units by mouth daily.   dapagliflozin propanediol ([name]) 10 MG TABS tablet Take 1 tablet (10 mg total) by mouth daily before breakfast.   famotidine (PEPCID) 40 MG tablet Take 2 tablets (80 mg total) by mouth at bedtime.   fexofenadine (ALLEGRA) 180 MG tablet Take 180 mg by mouth at bedtime.   glipiZIDE (GLUCOTROL) 5 MG tablet TAKE 2 TABLETS BY MOUTH WITH BREAKFAST   ipratropium (ATROVENT) 0.06 % nasal spray Place 2-4 sprays into both nostrils every 8 (eight) hours as needed for rhinitis.   ipratropium-albuterol (DUONEB) 0.5-2.5 (3) MG/3ML SOLN Take 3 mLs by nebulization every 4 (four) hours as needed. (Patient not taking: Reported on 01/19/2023)   metFORMIN (GLUCOPHAGE-XR) 500 MG 24 hr tablet 2 tabs with breakfast, 1 tab with lunch, 2 tabs with dinner   Metoprolol Tartrate 75 MG TABS Take 1 tablet (75 mg total) by mouth 2 (two) times daily.   Multiple Vitamin (MULTIVITAMIN PO) Take 1 tablet by mouth daily.   Multiple Vitamins-Minerals (ZINC PO) Take 1 tablet by mouth daily.   Nintedanib (OFEV) 100 MG CAPS Take 1 capsule (100 mg total) by mouth 2 (two) times daily.   nitroGLYCERIN (NITROSTAT) 0.4 MG SL tablet Place 1 tablet (0.4  mg total) under the tongue every 5 (five) minutes as needed for chest pain.   Omega-3 Fatty Acids (FISH OIL PO) Take 1 tablet by mouth 2 (two) times daily.   omeprazole (PRILOSEC) 40 MG capsule Take 1 capsule (40 mg total) by mouth daily.   ONETOUCH DELICA LANCETS 33G MISC TEST BLOOD SUGAR ONCE DAILY   ONETOUCH VERIO test strip USE UP TO 4 TIMES A DAY AS DIRECTED   predniSONE (DELTASONE) 10 MG tablet Take 1 tablet (10 mg total) by mouth daily with breakfast.   rivaroxaban (XARELTO) 20 MG  TABS tablet Take 1 tablet (20 mg total) by mouth daily with supper.   No facility-administered encounter medications on file as of 04/04/2023.   Physical Exam: Blood pressure 110/80, pulse 87, temperature 97.8 F (36.6 C), temperature source Oral, height 5\' 8"  (1.727 m), weight 150 lb 3.2 oz (68.1 kg), SpO2 94%. Gen:      No acute distress HEENT:  EOMI, sclera anicteric Neck:     No masses; no thyromegaly Lungs:    Bibasal crackles CV:         Regular rate and rhythm; no murmurs Abd:      + bowel sounds; soft, non-tender; no palpable masses, no distension Ext:    No edema; adequate peripheral perfusion Skin:      Warm and dry; no rash Neuro: alert and oriented x 3 Psych: normal mood and affect   Data Reviewed: Imaging Chest x-ray 12/04/16-stable interstitial opacities CT abdomen 11/20/13-mild reticular opacities at the bases CT high-resolution 01/02/17- subpleural reticulation, mild traction bronchiectasis, possible early honeycombing with basal gradient. 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  Slightly worse compared to 2018 High-res CT 03/21/2019-probable UIP High-res CT 03/30/2020-unchanged probable UIP fibrosis High-res CT 09/02/2020- unchanged probable UIP fibrosis High-res CT 06/30/2021-slight worsening of fibrosis and probable UIP pattern High-res CT 03/28/2023-stable pattern of UIP pulmonary fibrosis. I have reviewed the images personally  PFTs  03/20/17- FVC 2.74 (72%], FEV1 2.38 (87%), F/F 87, TLC 62%, DLCO 15.89 (56%)  01/15/2018-FVC 2.74 [73%], FEV1 2.33 [86%], F/F 85, TLC 71%, DLCO 13.13 (46%)  07/23/2018-FVC 2.51 [69%), FEV1 2.10 [78%), DLCO 15.16 (53%) Mild restriction, moderate-severe diffusion defect.  04/02/2019 FVC 2.70 [72%], FEV1 2.18 [82%], F/F 81, DLCO 13.8 [60%] Moderate diffusion defect  08/31/2020 FVC 2.52 [65%], FEV1 2.25 [82%], F/F 89, TLC 4.64 [69%], DLCO 20.59 [88%] Mild restriction.  Improvement in diffusion capacity  08/24/2021 FVC 2.03 [53%],  FEV1 1.53 [56%], F/F 75, TLC 2.78 [41%], DLCO 13.45 [58%] Severe restriction, moderate diffusion defect  07/24/2022 Unable to complete due to cough  FENO 12/18/16- 12  Labs ILD serologies 01/09/17- all negative except for mild elevation in SCL 75.  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  PFTs today show significant worsening of restriction and diffusion impairment.  Follow-up CT from last month reviewed with slight interval progression of fibrosis.  Started Esbriet in January 2023 but we had to stop it as he developed significant sunburn. Now on Ofev after review of CT scan showing interval progression of pulmonary fibrosis we have decided to initiate therapy.  Started at a lower dose of 100 mg twice daily as he already has some diarrhea at baseline.  Recent hepatic panel last month is within normal limits  Chronic cough Likely  secondary to pulmonary fibrosis He has been treated adequately for GERD and tried on antihistamines, gabapentin, amitriptyline which did not help ENT examination in 2022 is unremarkable.  He has a follow-up evaluation by Dr. Delford Field, ENT at [facility]  Post COVID-19 Has made a good recovery.  He had mild symptoms which did not require hospitalization  Health maintenance 01/15/2015-Prevnar 13 05/05/2010-Pneumovax  Plan/Recommendations: Continue Ofev 100 mg twice daily Prednisone 10 mg a day PPI Codeine cough syrup, amitriptyline, ENT evaluation  Chilton Greathouse MD [facility] 04/04/2023, 11:12 AM  CC: [name], MD

## 2023-04-04 NOTE — Patient Instructions (Signed)
I am glad you are stable with your breathing Your CT scan is also stable which is good news Continue current therapy Follow-up in 6 months

## 2023-04-09 ENCOUNTER — Telehealth: Payer: Self-pay

## 2023-04-09 NOTE — Telephone Encounter (Signed)
Transition Care Management Unsuccessful Follow-up Telephone Call  Date of discharge and from where:  [facility] 8[facility]  Attempts:  2nd Attempt  Reason for unsuccessful TCM follow-up call:  No answer/busy   Lenard Forth [facility] Guide, [facility] [phone] [address] [place], [place], [place] [address] Phone: [phone] Email: [email or web address]

## 2023-04-09 NOTE — Telephone Encounter (Signed)
Transition Care Management Unsuccessful Follow-up Telephone Call  Date of discharge and from where:  [facility]  Attempts:  1st Attempt  Reason for unsuccessful TCM follow-up call:  No answer/busy   Lenard Forth [facility] Guide, [facility] [phone] [address] [place], [place], [place] [address] Phone: [phone] Email: [email or web address]

## 2023-04-12 ENCOUNTER — Other Ambulatory Visit (HOSPITAL_COMMUNITY): Payer: Self-pay

## 2023-04-12 ENCOUNTER — Ambulatory Visit: Payer: PPO | Attending: Cardiovascular Disease | Admitting: Pharmacist

## 2023-04-12 ENCOUNTER — Other Ambulatory Visit: Payer: Self-pay | Admitting: Pulmonary Disease

## 2023-04-12 DIAGNOSIS — E785 Hyperlipidemia, unspecified: Secondary | ICD-10-CM

## 2023-04-12 DIAGNOSIS — I2510 Atherosclerotic heart disease of native coronary artery without angina pectoris: Secondary | ICD-10-CM | POA: Insufficient documentation

## 2023-04-12 DIAGNOSIS — I700 Atherosclerosis of aorta: Secondary | ICD-10-CM

## 2023-04-12 NOTE — Patient Instructions (Signed)
It was nice meeting you two today  We would like your LDL (bad cholesterol) to be less than 55  Please continue your atorvastatin 80mg  daily  The medication we discussed today is called Repatha, which is an injection you would take once every 2 weeks  I will complete the prior authorization for you and contact you when it is approved  We will recheck your cholesterol in about 3 months  Please let me know if you have any questions  Laural Golden, PharmD, BCACP, CDCES, CPP [address] [place], [place], [address] Phone: [phone], Fax: [phone]

## 2023-04-12 NOTE — Progress Notes (Signed)
Patient ID: Todd [name]                 DOB: 01-07-1941                    MRN: [id number]     HPI: Todd [name] is a 82 y.o. male patient referred to lipid clinic by Todd [name]. Patient of Dr Antoine Poche. PMH is significant for CAD, CBG x4, a fib, PVD, storke, HTN, pulmonary fibrosis, HTN, HLD and T2DM.  Patient presents today with wife. Feels well although concerned he has been losing weight. Remains physically active in his yard but will occasionally have SOB. Believes it is due to his pulmonary fibrosis. Currently being treated with Ofev.  Currently managed on atorvastatin 80mg . Reports compliance. No patient reported adverse effects.   Current Medications:  Atorvastatin 80mg   Intolerances:  N/A  Risk Factors:  Hx of CABG T2DM CAD PVD Aortic atherosclerosis  LDL goal: <55  Labs: TC 131, Trigs 146, HDL 36, LDL 69 (03/16/23 on Atorvastatin 80  Imaging: There is aortic atherosclerosis, as well as atherosclerosis of the great vessels of the mediastinum and the coronary arteries, including calcified atherosclerotic plaque in the left main, left anterior descending, left circumflex and right coronary arteries. Status post median sternotomy for CABG including LIMA to the LAD.  Past Medical History:  Diagnosis Date   Allergic rhinitis    Asthma    Atrial fibrillation with rapid ventricular response (HCC)    a. newly diagnosed 11/03/13, spont conv to NSR, placed on xarelto.   CAD (coronary artery disease)    a. 1999; s/p CABG: LIMA to LAD, SVG to OM1, SVG to OM2, SVG to RCA  b. Normal nuc 10/2013 (done because of new onset AF.)   COVID    Diabetes mellitus (HCC)    HLD (hyperlipidemia)    HTN (hypertension)    Nephrolithiasis    Overweight(278.02)    PVD (peripheral vascular disease) (HCC)    Carotid stenosis   Stroke [facility])     Current Outpatient Medications on File Prior to Visit  Medication Sig Dispense Refill   acetaminophen (TYLENOL) 650 MG CR tablet Take 650 mg by  mouth every 8 (eight) hours as needed for pain.     albuterol (VENTOLIN HFA) 108 (90 Base) MCG/ACT inhaler Inhale 2 puffs into the lungs every 6 (six) hours as needed for wheezing or shortness of breath. 8 g 6   amitriptyline (ELAVIL) 10 MG tablet Take 1 tablet (10 mg total) by mouth at bedtime.     aspirin EC 81 MG tablet Take 1 tablet (81 mg total) by mouth daily. Swallow whole. 120 tablet 3   atorvastatin (LIPITOR) 80 MG tablet Take 1 tablet (80 mg total) by mouth daily. 90 tablet 3   cholecalciferol (VITAMIN D3) 25 MCG (1000 UNIT) tablet Take 1,000 Units by mouth daily.     dapagliflozin propanediol ([name]) 10 MG TABS tablet Take 1 tablet (10 mg total) by mouth daily before breakfast. 30 tablet 5   famotidine (PEPCID) 40 MG tablet Take 2 tablets (80 mg total) by mouth at bedtime.     fexofenadine (ALLEGRA) 180 MG tablet Take 180 mg by mouth at bedtime.     glipiZIDE (GLUCOTROL) 5 MG tablet TAKE 2 TABLETS BY MOUTH WITH BREAKFAST 270 tablet 1   ipratropium (ATROVENT) 0.06 % nasal spray Place 2-4 sprays into both nostrils every 8 (eight) hours as needed for rhinitis.     ipratropium-albuterol (DUONEB)  0.5-2.5 (3) MG/3ML SOLN Take 3 mLs by nebulization every 4 (four) hours as needed. 360 mL 3   metFORMIN (GLUCOPHAGE-XR) 500 MG 24 hr tablet 2 tabs with breakfast, 1 tab with lunch, 2 tabs with dinner  0   Metoprolol Tartrate 75 MG TABS Take 1 tablet (75 mg total) by mouth 2 (two) times daily. 60 tablet 4   Multiple Vitamin (MULTIVITAMIN PO) Take 1 tablet by mouth daily.     Multiple Vitamins-Minerals (ZINC PO) Take 1 tablet by mouth daily.     Nintedanib (OFEV) 100 MG CAPS Take 1 capsule (100 mg total) by mouth 2 (two) times daily. 180 capsule 1   nitroGLYCERIN (NITROSTAT) 0.4 MG SL tablet Place 1 tablet (0.4 mg total) under the tongue every 5 (five) minutes as needed for chest pain. 100 tablet 3   Omega-3 Fatty Acids (FISH OIL PO) Take 1 tablet by mouth 2 (two) times daily.     omeprazole  (PRILOSEC) 40 MG capsule Take 1 capsule (40 mg total) by mouth daily.     ONETOUCH DELICA LANCETS 33G MISC TEST BLOOD SUGAR ONCE DAILY 100 each 3   ONETOUCH VERIO test strip USE UP TO 4 TIMES A DAY AS DIRECTED 100 strip 0   predniSONE (DELTASONE) 10 MG tablet Take 1 tablet (10 mg total) by mouth daily with breakfast. 30 tablet 2   rivaroxaban (XARELTO) 20 MG TABS tablet Take 1 tablet (20 mg total) by mouth daily with supper. 90 tablet 3   No current facility-administered medications on file prior to visit.    Allergies  Allergen Reactions   Amlodipine Besylate     Rash Because of a history of documented adverse serious drug reaction;Medi Alert bracelet  is recommended   Iodinated Contrast Media Rash    Rash  Because of a history of documented adverse serious drug reaction;Medi Alert bracelet  is recommended  Rash, Because of a history of documented adverse serious drug reaction;Medi Alert bracelet  is recommended    Assessment/Plan:  1. Hyperlipidemia - Patient's last LDL 69 which is above goal of <55. Aggressive goal due to CAD, CABG and T2DM. Patient interested in continuing to lower risk. Recommended starting PCSK9i.  Using [employer], educated patient on mechanism of action, storage, site selection, administration, and possible adverse effects. Patient voiced understanding. Will complete PA and contact patient when approved. Recheck lipid panel in 2-3 months  Continue atorvastatin 80mg  daily Start Repatha 140mg  q 2 weeks Recheck lipid panel in 2-3 months  Laural Golden, PharmD, BCACP, CDCES, CPP [address] [place], [place], [address] Phone: [phone], Fax: [phone]

## 2023-04-17 DIAGNOSIS — G245 Blepharospasm: Secondary | ICD-10-CM | POA: Diagnosis not present

## 2023-04-17 DIAGNOSIS — H18413 Arcus senilis, bilateral: Secondary | ICD-10-CM | POA: Diagnosis not present

## 2023-04-17 DIAGNOSIS — H16223 Keratoconjunctivitis sicca, not specified as Sjogren's, bilateral: Secondary | ICD-10-CM | POA: Diagnosis not present

## 2023-04-17 DIAGNOSIS — Z961 Presence of intraocular lens: Secondary | ICD-10-CM | POA: Diagnosis not present

## 2023-04-18 ENCOUNTER — Encounter: Payer: Self-pay | Admitting: Pharmacist

## 2023-04-18 ENCOUNTER — Telehealth: Payer: Self-pay

## 2023-04-18 ENCOUNTER — Telehealth: Payer: Self-pay | Admitting: Pharmacist

## 2023-04-18 ENCOUNTER — Other Ambulatory Visit (HOSPITAL_COMMUNITY): Payer: Self-pay

## 2023-04-18 DIAGNOSIS — I2510 Atherosclerotic heart disease of native coronary artery without angina pectoris: Secondary | ICD-10-CM

## 2023-04-18 DIAGNOSIS — I700 Atherosclerosis of aorta: Secondary | ICD-10-CM

## 2023-04-18 DIAGNOSIS — E785 Hyperlipidemia, unspecified: Secondary | ICD-10-CM

## 2023-04-18 NOTE — Telephone Encounter (Signed)
Pharmacy Patient Advocate Encounter   Received notification from Physician's Office/PHARMD PAVERO that prior authorization for REPATHA} is required/requested.   Insurance verification completed.   The patient is insured through [employer]/ [employer] Advance .   Per test claim: PA required; PA submitted to [employer] via CoverMyMeds Key/confirmation #/[id number] .             Status is pending

## 2023-04-18 NOTE — Telephone Encounter (Signed)
Please complete PA for repatha. ICD codes I21.4, E78.5

## 2023-04-20 ENCOUNTER — Other Ambulatory Visit (HOSPITAL_COMMUNITY): Payer: Self-pay

## 2023-04-20 ENCOUNTER — Telehealth: Payer: Self-pay | Admitting: Pulmonary Disease

## 2023-04-20 MED ORDER — PREDNISONE 10 MG PO TABS: 10.0000 mg | ORAL_TABLET | Freq: Every day | ORAL | 5 refills | Status: DC

## 2023-04-20 NOTE — Telephone Encounter (Signed)
PT wife is calling. States husband needs more Pred and although Pharm has sent in 2 requests it has not filled. For the sake of medicinal continuity and the long weekend I am sending back High Priority.  Pharm is [facility],

## 2023-04-20 NOTE — Telephone Encounter (Signed)
I have refilled pred  Nothing further needed

## 2023-04-20 NOTE — Telephone Encounter (Signed)
Pharmacy Patient Advocate Encounter  Received notification from [employer]/ [employer] that Prior Authorization for REPATHA has been APPROVED from 8.29.24 to 03.01.25   Per test claim:  51ml/30day supply is a cost of 134.65    This test claim was processed through [facility]- copay amounts may vary at other pharmacies due to pharmacy/plan contracts, or as the patient moves through the different stages of their insurance plan.

## 2023-04-24 MED ORDER — REPATHA SURECLICK 140 MG/ML ~~LOC~~ SOAJ: 1.0000 mL | SUBCUTANEOUS | 5 refills | Status: DC

## 2023-04-24 NOTE — Addendum Note (Signed)
Addended by: Cheree Ditto on: 04/24/2023 08:20 AM   Modules accepted: Orders

## 2023-04-25 DIAGNOSIS — R053 Chronic cough: Secondary | ICD-10-CM | POA: Diagnosis not present

## 2023-04-25 DIAGNOSIS — R1310 Dysphagia, unspecified: Secondary | ICD-10-CM | POA: Diagnosis not present

## 2023-04-25 DIAGNOSIS — R6889 Other general symptoms and signs: Secondary | ICD-10-CM | POA: Diagnosis not present

## 2023-04-25 DIAGNOSIS — R1314 Dysphagia, pharyngoesophageal phase: Secondary | ICD-10-CM | POA: Diagnosis not present

## 2023-04-25 DIAGNOSIS — J385 Laryngeal spasm: Secondary | ICD-10-CM | POA: Diagnosis not present

## 2023-04-25 DIAGNOSIS — J387 Other diseases of larynx: Secondary | ICD-10-CM | POA: Diagnosis not present

## 2023-04-25 DIAGNOSIS — R1313 Dysphagia, pharyngeal phase: Secondary | ICD-10-CM | POA: Diagnosis not present

## 2023-04-25 DIAGNOSIS — J383 Other diseases of vocal cords: Secondary | ICD-10-CM | POA: Diagnosis not present

## 2023-04-26 NOTE — Progress Notes (Signed)
Cardiology Office Note:   Date:  04/27/2023  ID:  Todd [name], DOB [birth year], MRN [id number] PCP: [name], MD  [facility] Providers Cardiologist:  Rollene Rotunda, MD {  History of Present Illness:   Todd [name] is a 82 y.o. male with a history of  CABG x4 and paroxysmal atrial fibrillation. He has LIMA to LAD, SVG to OM1, SVG to OM2, SVG to RCA. He was recently admitted from 01/07/2023 to 01/09/2023 for atrial fibrillation with RVR after presenting with chest pressure and was found to have an elevated troponin. High-sensitivity troponin peaked at 5,977. Echo showed LVEF of 55-60% with no regional wall motion abnormalities and mild LVH. He underwent LHC which showed patent LIMA to LAD, SVG to RCA, and SVG to OM2 with 65% stenosis of the proximal SVG. Elevated troponin was felt to be due to demand ischemia in the setting of rapid atrial fibrillation rather than true ACS. Continued medical therapy was recommended with consideration of PCI to SVG to OM2 if he has refractory angina. He was placed IV Diltiazem and converted back to sinus rhythm and then was transitioned back to PO Metoprolol. Of note, he had recently been place on Prednisone as well as an albuterol inhaler for chronic cough which may have contributed to recurrent atrial fibrillation.   Patient presents today for follow-up.  He has a Zio after the last appt and had PAF/flutter lasting up to 15 hours.  He had NSVT 4 beats and had his beta blocker increased.   Since he wore that monitor he has been back in the ER with atrial fibrillation with rapid rate.  He converted to sinus rhythm before he was given a dose of Cardizem.  It seems that sometimes he feels the fibrillation likely went to the ER and sometimes he does not like when he was wearing the monitor and again today.  He would not really know that he was in fibrillation.  He has such problems with chronic coughing which apparently is being managed to some degree by OT and  some rules around swallowing.  He has lost about 10 pounds.  He has had no new chest pressure, neck or arm discomfort.  He has had some increasing chronic lower extremity swelling.   ROS: As stated in the HPI and negative for all other systems.  Studies Reviewed:    EKG:   EKG Interpretation Date/Time:  Friday April 27 2023 12:20:06 EDT Ventricular Rate:  121 PR Interval:    QRS Duration:  88 QT Interval:  288 QTC Calculation: 408 R Axis:   -9  Text Interpretation: Atrial flutter with variable A-V block Left ventricular hypertrophy with repolarization abnormality ( R in aVL ) Inferior infarct , age undetermined When compared with ECG of 01-Apr-2023 16:45,  Rhythm is no longer normal sinus Confirmed by Rollene Rotunda ([id number]) on 04/27/2023 12:23:29 PM     Risk Assessment/Calculations:    CHA2DS2-VASc Score = 5   This indicates a 7.2% annual risk of stroke. The patient's score is based upon: CHF History: 0 HTN History: 1 Diabetes History: 1 Stroke History: 0 Vascular Disease History: 1 Age Score: 2 Gender Score: 0       Physical Exam:   VS:  BP 120/82 (BP Location: Right Arm, Patient Position: Sitting, Cuff Size: Normal)   Pulse (!) 129   Ht 5\' 8"  (1.727 m)   Wt 152 lb 3.2 oz (69 kg)   SpO2 97%   BMI 23.14 kg/m  Wt Readings from Last 3 Encounters:  04/27/23 152 lb 3.2 oz (69 kg)  04/04/23 150 lb 3.2 oz (68.1 kg)  04/01/23 153 lb 14.1 oz (69.8 kg)     GEN: Increasingly frail appearing NECK: No JVD; No carotid bruits CARDIAC: Irregular RRR, no murmurs, rubs, gallops RESPIRATORY: Decreased breath sounds ABDOMEN: Soft, non-tender, non-distended EXTREMITIES: Moderate bilateral lower extremity edema; No deformity   ASSESSMENT AND PLAN:   Paroxysmal Atrial Fibrillation: I suspect he is in atrial fibrillation more than he knows and he does not feel it all the time.  I am going to try rate control by increasing his metoprolol to 100 mg twice daily.  He tolerates  anticoagulation.  We might have to consider rhythm control given his frailty to me that would most likely be Tikosyn and need need to be hospitalized.  I will have him keep a blood pressure diary to include his heart rate in the chest a pulse oximeter.  If he is in A-fib but the rate is relatively controlled and he is relatively asymptomatic then this would be the therapy.   CAD s/p CABG:  The patient has no new sypmtoms.  No further cardiovascular testing is indicated.  We will continue with aggressive risk reduction and meds as listed.   He has results as noted above and we manage medically.   Carotid Artery Stenosis: No further testing is planned at this time.  Pulmonary Fibrosis: He is now being [facility] at [facility] for this.  Hypertension: This is being managed in the context of treating his fibrillation.   Hyperlipidemia: His LDLs been in the 60s despite being switched to Lipitor.  He was to take a PCSK9 inhibitor but his family would like to hold off on this and I will repeat a lipid profile in about 3 months.  Type 2 Diabetes Mellitus: A1c is 8.1.  Management as per his primary.  Edema: Give him Lasix 20 mg for about 3 days.  He is to wear his compression stockings.     Follow up with APP in two months.   Signed, Rollene Rotunda, MD

## 2023-04-27 ENCOUNTER — Encounter: Payer: Self-pay | Admitting: Cardiology

## 2023-04-27 ENCOUNTER — Ambulatory Visit: Payer: PPO | Attending: Cardiology | Admitting: Cardiology

## 2023-04-27 VITALS — BP 120/82 | HR 129 | Ht 68.0 in | Wt 152.2 lb

## 2023-04-27 DIAGNOSIS — Z951 Presence of aortocoronary bypass graft: Secondary | ICD-10-CM | POA: Diagnosis not present

## 2023-04-27 DIAGNOSIS — I700 Atherosclerosis of aorta: Secondary | ICD-10-CM

## 2023-04-27 DIAGNOSIS — I10 Essential (primary) hypertension: Secondary | ICD-10-CM | POA: Diagnosis not present

## 2023-04-27 DIAGNOSIS — I4891 Unspecified atrial fibrillation: Secondary | ICD-10-CM

## 2023-04-27 DIAGNOSIS — E785 Hyperlipidemia, unspecified: Secondary | ICD-10-CM | POA: Diagnosis not present

## 2023-04-27 DIAGNOSIS — I2510 Atherosclerotic heart disease of native coronary artery without angina pectoris: Secondary | ICD-10-CM | POA: Diagnosis not present

## 2023-04-27 MED ORDER — FUROSEMIDE 20 MG PO TABS: 20.0000 mg | ORAL_TABLET | Freq: Every day | ORAL | 0 refills | Status: DC

## 2023-04-27 MED ORDER — METOPROLOL TARTRATE 100 MG PO TABS
100.0000 mg | ORAL_TABLET | Freq: Two times a day (BID) | ORAL | 3 refills | Status: DC
Start: 2023-04-27 — End: 2023-10-02

## 2023-04-27 NOTE — Patient Instructions (Addendum)
Medication Instructions:  - Start taking Metoprolol Tartrate 100mg , twice daily  - Start Lasix 20mg , once daily for three days.   *If you need a refill on your cardiac medications before your next appointment, please call your pharmacy*   Lab Work:  Your physician recommends that you return for lab work in 3 months for : -Lipid Panel   If you have labs (blood work) drawn today and your tests are completely normal, you will receive your results only by: MyChart Message (if you have MyChart) OR A paper copy in the mail If you have any lab test that is abnormal or we need to change your treatment, we will call you to review the results.      Follow-Up: At [facility], you and your health needs are our priority.  As part of our continuing mission to provide you with exceptional heart care, we have created designated Provider Care Teams.  These Care Teams include your primary Cardiologist (physician) and Advanced Practice Providers (APPs -  Physician Assistants and Nurse Practitioners) who all work together to provide you with the care you need, when you need it.  We recommend signing up for the patient portal called "MyChart".  Sign up information is provided on this After Visit Summary.  MyChart is used to connect with patients for Virtual Visits (Telemedicine).  Patients are able to view lab/test results, encounter notes, upcoming appointments, etc.  Non-urgent messages can be sent to your provider as well.   To learn more about what you can do with MyChart, go to [email or web address].    Your next appointment:   2 month(s)  The format for your next appointment:   In Person  Provider:   Edd Fabian, FNP, Rito Ehrlich, or Joni Reining, DNP, ANP     Dr. Antoine Poche recommends eating more protein and wearing compression stockings daily.

## 2023-05-01 ENCOUNTER — Ambulatory Visit: Payer: PPO | Admitting: Student

## 2023-05-14 ENCOUNTER — Telehealth: Payer: Self-pay | Admitting: Pulmonary Disease

## 2023-05-14 ENCOUNTER — Telehealth: Payer: Self-pay | Admitting: Internal Medicine

## 2023-05-14 DIAGNOSIS — J849 Interstitial pulmonary disease, unspecified: Secondary | ICD-10-CM

## 2023-05-14 MED ORDER — OFEV 100 MG PO CAPS
100.0000 mg | ORAL_CAPSULE | Freq: Two times a day (BID) | ORAL | 1 refills | Status: DC
Start: 2023-05-14 — End: 2023-06-07

## 2023-05-14 NOTE — Telephone Encounter (Signed)
Patient's wife Darel Hong called and said patient is using the bathroom a lot and seems to be losing weight. She thinks it is related to one or more of his medications and would like to speak with a nurse. Best callback for Darel Hong is [phone].

## 2023-05-14 NOTE — Telephone Encounter (Signed)
Spoke with patient's daughter Selena Batten and advised of refill process. Pharmacy has changed to [facility]  She will text information to Darel Hong, pt's wife  LFTs on 03/16/23 wnl Dose: 100mg  twice daily  Chesley Mires, PharmD, MPH, BCPS, CPP Clinical Pharmacist (Rheumatology and Pulmonology)

## 2023-05-14 NOTE — Telephone Encounter (Signed)
Darel Hong wife states patient needs refill for Ofev. Pharmacy is [facility]. Darel Hong phone number is [phone].

## 2023-05-15 NOTE — Telephone Encounter (Signed)
Appointment made for this Friday

## 2023-05-18 ENCOUNTER — Other Ambulatory Visit: Payer: Self-pay | Admitting: Internal Medicine

## 2023-05-18 ENCOUNTER — Ambulatory Visit: Payer: PPO | Admitting: Internal Medicine

## 2023-05-21 ENCOUNTER — Encounter: Payer: Self-pay | Admitting: Internal Medicine

## 2023-05-21 ENCOUNTER — Encounter: Payer: Self-pay | Admitting: Pulmonary Disease

## 2023-05-24 MED ORDER — GLIPIZIDE 5 MG PO TABS: 10.0000 mg | ORAL_TABLET | Freq: Two times a day (BID) | ORAL | 1 refills | Status: DC

## 2023-05-29 DIAGNOSIS — N202 Calculus of kidney with calculus of ureter: Secondary | ICD-10-CM | POA: Diagnosis not present

## 2023-05-29 DIAGNOSIS — R3915 Urgency of urination: Secondary | ICD-10-CM | POA: Diagnosis not present

## 2023-06-05 ENCOUNTER — Encounter: Payer: Self-pay | Admitting: Internal Medicine

## 2023-06-05 DIAGNOSIS — J383 Other diseases of vocal cords: Secondary | ICD-10-CM | POA: Diagnosis not present

## 2023-06-05 DIAGNOSIS — R053 Chronic cough: Secondary | ICD-10-CM | POA: Diagnosis not present

## 2023-06-05 DIAGNOSIS — J385 Laryngeal spasm: Secondary | ICD-10-CM | POA: Diagnosis not present

## 2023-06-05 DIAGNOSIS — R1310 Dysphagia, unspecified: Secondary | ICD-10-CM | POA: Diagnosis not present

## 2023-06-05 DIAGNOSIS — R6889 Other general symptoms and signs: Secondary | ICD-10-CM | POA: Diagnosis not present

## 2023-06-05 DIAGNOSIS — R1314 Dysphagia, pharyngoesophageal phase: Secondary | ICD-10-CM | POA: Diagnosis not present

## 2023-06-05 DIAGNOSIS — J387 Other diseases of larynx: Secondary | ICD-10-CM | POA: Diagnosis not present

## 2023-06-05 DIAGNOSIS — R1313 Dysphagia, pharyngeal phase: Secondary | ICD-10-CM | POA: Diagnosis not present

## 2023-06-05 NOTE — Patient Instructions (Signed)
Blood work was ordered.   The lab is on the first floor.    Medications changes include :       A referral was ordered and someone will call you to schedule an appointment.     No follow-ups on file.
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
## 2023-06-06 ENCOUNTER — Ambulatory Visit (INDEPENDENT_AMBULATORY_CARE_PROVIDER_SITE_OTHER): Payer: PPO

## 2023-06-06 ENCOUNTER — Ambulatory Visit (INDEPENDENT_AMBULATORY_CARE_PROVIDER_SITE_OTHER): Payer: PPO | Admitting: Internal Medicine

## 2023-06-06 ENCOUNTER — Other Ambulatory Visit: Payer: Self-pay | Admitting: Internal Medicine

## 2023-06-06 VITALS — BP 114/78 | HR 80 | Temp 98.5°F | Ht 68.0 in | Wt 148.0 lb

## 2023-06-06 DIAGNOSIS — Z7984 Long term (current) use of oral hypoglycemic drugs: Secondary | ICD-10-CM

## 2023-06-06 DIAGNOSIS — R0781 Pleurodynia: Secondary | ICD-10-CM | POA: Diagnosis not present

## 2023-06-06 DIAGNOSIS — I10 Essential (primary) hypertension: Secondary | ICD-10-CM

## 2023-06-06 DIAGNOSIS — I4891 Unspecified atrial fibrillation: Secondary | ICD-10-CM

## 2023-06-06 DIAGNOSIS — E7849 Other hyperlipidemia: Secondary | ICD-10-CM

## 2023-06-06 DIAGNOSIS — E1151 Type 2 diabetes mellitus with diabetic peripheral angiopathy without gangrene: Secondary | ICD-10-CM

## 2023-06-06 LAB — COMPREHENSIVE METABOLIC PANEL
ALT: 31 U/L (ref 0–53)
AST: 27 U/L (ref 0–37)
Albumin: 3.5 g/dL (ref 3.5–5.2)
Alkaline Phosphatase: 103 U/L (ref 39–117)
BUN: 25 mg/dL — ABNORMAL HIGH (ref 6–23)
CO2: 29 meq/L (ref 19–32)
Calcium: 9.7 mg/dL (ref 8.4–10.5)
Chloride: 104 meq/L (ref 96–112)
Creatinine, Ser: 0.8 mg/dL (ref 0.40–1.50)
GFR: 83.68 mL/min (ref >60.00)
Glucose, Bld: 188 mg/dL — ABNORMAL HIGH (ref 70–99)
Potassium: 4.6 meq/L (ref 3.5–5.1)
Sodium: 141 meq/L (ref 135–145)
Total Bilirubin: 0.9 mg/dL (ref 0.2–1.2)
Total Protein: 7.1 g/dL (ref 6.0–8.3)

## 2023-06-06 LAB — CBC WITH DIFFERENTIAL/PLATELET
Basophils Absolute: 0 10*3/uL (ref 0.0–0.1)
Basophils Relative: 0.2 % (ref 0.0–3.0)
Eosinophils Absolute: 0.1 10*3/uL (ref 0.0–0.7)
Eosinophils Relative: 0.6 % (ref 0.0–5.0)
HCT: 41.5 % (ref 39.0–52.0)
Hemoglobin: 13.7 g/dL (ref 13.0–17.0)
Lymphocytes Relative: 17 % (ref 12.0–46.0)
Lymphs Abs: 1.7 10*3/uL (ref 0.7–4.0)
MCHC: 33.1 g/dL (ref 30.0–36.0)
MCV: 107.8 fL — ABNORMAL HIGH (ref 78.0–100.0)
Monocytes Absolute: 0.8 10*3/uL (ref 0.1–1.0)
Monocytes Relative: 7.9 % (ref 3.0–12.0)
Neutro Abs: 7.6 10*3/uL (ref 1.4–7.7)
Neutrophils Relative %: 74.3 % (ref 43.0–77.0)
Platelets: 183 10*3/uL (ref 150.0–400.0)
RBC: 3.85 Mil/uL — ABNORMAL LOW (ref 4.22–5.81)
RDW: 16.3 % — ABNORMAL HIGH (ref 11.5–15.5)
WBC: 10.3 10*3/uL (ref 4.0–10.5)

## 2023-06-06 LAB — LIPID PANEL
Cholesterol: 118 mg/dL (ref 0–200)
HDL: 44.7 mg/dL (ref >39.00)
LDL Cholesterol: 52 mg/dL (ref 0–99)
NonHDL: 73.13
Total CHOL/HDL Ratio: 3
Triglycerides: 106 mg/dL (ref 0.0–149.0)
VLDL: 21.2 mg/dL (ref 0.0–40.0)

## 2023-06-06 LAB — MICROALBUMIN / CREATININE URINE RATIO
Creatinine,U: 49.5 mg/dL
Microalb Creat Ratio: 6.4 mg/g (ref 0.0–30.0)
Microalb, Ur: 3.2 mg/dL — ABNORMAL HIGH (ref 0.0–1.9)

## 2023-06-06 LAB — HEMOGLOBIN A1C: Hgb A1c MFr Bld: 8.9 % — ABNORMAL HIGH (ref 4.6–6.5)

## 2023-06-06 NOTE — Assessment & Plan Note (Addendum)
Chronic  Lab Results  Component Value Date   HGBA1C 8.9 (H) 06/06/2023   Sugars not controlled-worse now than they have been He is on prednisone 10 mg daily and has been on prednisone several times per his coughing/lung condition Check A1c, microalbumin today stopped metformin 1000 mg twice daily due to diarrhea Continue glipizide 10 mg bid AC and [name] 10 mg daily Stressed regular exercise, diabetic diet Marcelline Deist is expensive-will see if we can get patient assistance He does state that his sugars are well-controlled he had the A1c is telling a different story-will see if our pharmacist can help for reaching out to him May need to consider restarting metformin-extended release at a lower dose which may not cause diarrhea Could consider restarting Rybelsus, but his appetite is very poor so I am concerned about further hampering his appetite Could consider insulin

## 2023-06-06 NOTE — Assessment & Plan Note (Signed)
Chronic Continue atorvastatin 80 mg daily Check lipid panel, cmp Regular exercise and healthy diet encouraged

## 2023-06-06 NOTE — Assessment & Plan Note (Signed)
Chronic Blood pressure well controlled cmp Continue metoprolol 100 mg twice daily

## 2023-06-06 NOTE — Assessment & Plan Note (Signed)
Chronic Following with cardiology On xarelto and metoprolol CBC, CMP

## 2023-06-07 ENCOUNTER — Ambulatory Visit: Payer: PPO | Admitting: Physician Assistant

## 2023-06-07 ENCOUNTER — Telehealth: Payer: Self-pay | Admitting: Pharmacist

## 2023-06-07 ENCOUNTER — Telehealth: Payer: Self-pay

## 2023-06-07 ENCOUNTER — Other Ambulatory Visit: Payer: Self-pay | Admitting: Pharmacist

## 2023-06-07 ENCOUNTER — Encounter: Payer: Self-pay | Admitting: Internal Medicine

## 2023-06-07 DIAGNOSIS — J849 Interstitial pulmonary disease, unspecified: Secondary | ICD-10-CM

## 2023-06-07 DIAGNOSIS — E1151 Type 2 diabetes mellitus with diabetic peripheral angiopathy without gangrene: Secondary | ICD-10-CM

## 2023-06-07 MED ORDER — OFEV 100 MG PO CAPS
100.0000 mg | ORAL_CAPSULE | Freq: Two times a day (BID) | ORAL | 1 refills | Status: DC
Start: 2023-06-07 — End: 2023-09-26

## 2023-06-07 NOTE — Telephone Encounter (Signed)
MyChart message from today - family requesting to initiate PAP renewal process. Patient portion of application placed in mailbox today. Provider portion placed in Dr. Shirlee More mailbox for signature  Todd [name], PharmD, MPH, BCPS, CPP Clinical Pharmacist (Rheumatology and Pulmonology)

## 2023-06-07 NOTE — Telephone Encounter (Signed)
Darel Hong wife states patient due for appointment in February 2025. States needs refill for Ofev. Darel Hong phone number is [phone].

## 2023-06-07 NOTE — Telephone Encounter (Addendum)
Approval letter for [facility] faxed today  Refill for Ofev sent to [facility]  Patient portion of application placed in mailbox today  Chesley Mires, PharmD, MPH, BCPS, CPP Clinical Pharmacist (Rheumatology and Pulmonology)

## 2023-06-11 ENCOUNTER — Telehealth: Payer: Self-pay

## 2023-06-11 NOTE — Progress Notes (Signed)
   Care Guide Note  06/11/2023 Name: [name] MRN: [id number] DOB: 1940-10-17  Referred by: [name], MD Reason for referral : Care Coordination (Outreach to schedule with Pharm d )   [name] is a 82 y.o. year old male who is a primary care patient of Burns, [name], MD. Dola Argyle Foister was referred to the pharmacist for assistance related to DM.    Successful contact was made with the patient to discuss pharmacy services including being ready for the pharmacist to call at least 5 minutes before the scheduled appointment time, to have medication bottles and any blood sugar or blood pressure readings ready for review. The patient agreed to meet with the pharmacist via with the pharmacist via telephone visit on (date/time).  06/12/2023  Penne Lash, RMA Care Guide [facility]  [place], [place] [address] Direct Dial: [phone] [email or web address]

## 2023-06-12 ENCOUNTER — Other Ambulatory Visit (INDEPENDENT_AMBULATORY_CARE_PROVIDER_SITE_OTHER): Payer: PPO | Admitting: Pharmacist

## 2023-06-12 DIAGNOSIS — E1151 Type 2 diabetes mellitus with diabetic peripheral angiopathy without gangrene: Secondary | ICD-10-CM

## 2023-06-12 DIAGNOSIS — I4891 Unspecified atrial fibrillation: Secondary | ICD-10-CM

## 2023-06-12 MED ORDER — RIVAROXABAN 20 MG PO TABS
20.0000 mg | ORAL_TABLET | Freq: Every day | ORAL | 2 refills | Status: DC
Start: 2023-06-12 — End: 2023-09-20

## 2023-06-12 NOTE — Progress Notes (Unsigned)
   06/13/2023 Name: Todd [name] MRN: [id number] DOB: [birth year]  Chief Complaint  Patient presents with   Medication Management   Diabetes    Swan Handzel Speciale is a 82 y.o. year old male who presented for a telephone visit.   They were referred to the pharmacist by their PCP for assistance in managing diabetes and medication access.   Subjective:  Care Team: Primary Care Provider: [name], MD ; Next Scheduled Visit: 12/06/2023 Cardiologist: [name]; Next Scheduled Visit: 06/27/2023  Medication Access/Adherence  Current Pharmacy:  [facility] - [place], [place] - [address] Todd [name] [place] [address] Phone: [phone] Fax: [phone]   Patient reports affordability concerns with their medications: Yes  Patient reports access/transportation concerns to their pharmacy: No  Patient reports adherence concerns with their medications:  No    Xarelto and Marcelline Deist are around $150 per 30 day supply currently   Diabetes:  Current medications: [name] 10 mg daily, glipizide 5 mg 2 tablets twice daily Medications tried in the past: metformin XR 500 mg 2 tablets twice daily - caused diarrhea, Rybelsus caused reduced appetite  *pt is on daily prednisone, likely contributing to elevated BG   Atrial Fibrillation:  Current medications: Xarelto 20 mg daily   Objective:  Lab Results  Component Value Date   HGBA1C 8.9 (H) 06/06/2023    Lab Results  Component Value Date   CREATININE 0.80 06/06/2023   BUN 25 (H) 06/06/2023   NA 141 06/06/2023   K 4.6 06/06/2023   CL 104 06/06/2023   CO2 29 06/06/2023    Lab Results  Component Value Date   CHOL 118 06/06/2023   HDL 44.70 06/06/2023   LDLCALC 52 06/06/2023   TRIG 106.0 06/06/2023   CHOLHDL 3 06/06/2023    Medications Reviewed Today   Medications were not reviewed in this encounter      Assessment/Plan:   Diabetes: - Currently uncontrolled, Goal A1c <8.0% - Will  start [name] PAP application and provide 4 weeks of [name] samples - Restart metformin XR 500 mg 1 tablet twice daily  - If unable to tolerate metformin, next option would be basal insulin. Avoiding GLP-1 agonists due to ~30 lb weight loss over the last year.  Afib: - Enrolled patient in Xarelto with me for the rest of this year to get Xarelto for $89 for 30 DS, cheaper than his current $134 per 30DS. - Per Dr. Lawerance Bach, okay to send Rx under her name   Follow Up Plan: 2 week follow up scheduled  [name], PharmD, BCPS Clinical Pharmacist [facility] [phone]

## 2023-06-13 MED ORDER — METFORMIN HCL ER 500 MG PO TB24: 500.0000 mg | ORAL_TABLET | Freq: Two times a day (BID) | ORAL | 5 refills | Status: DC

## 2023-06-13 NOTE — Addendum Note (Signed)
Addended by: [name] on: 06/13/2023 05:49 AM   Modules accepted: Orders

## 2023-06-19 ENCOUNTER — Encounter: Payer: PPO | Admitting: Internal Medicine

## 2023-06-19 NOTE — Telephone Encounter (Signed)
Received signed patietn form however income information not answered and income documents not attached (attached 2023 benefits statement).  Placed in PAP pending info folder since we haven't received provider form yet  Chesley Mires, PharmD, MPH, BCPS, CPP Clinical Pharmacist (Rheumatology and Pulmonology)

## 2023-06-20 DIAGNOSIS — D6869 Other thrombophilia: Secondary | ICD-10-CM | POA: Diagnosis not present

## 2023-06-20 DIAGNOSIS — E119 Type 2 diabetes mellitus without complications: Secondary | ICD-10-CM | POA: Diagnosis not present

## 2023-06-20 DIAGNOSIS — Z6822 Body mass index (BMI) 22.0-22.9, adult: Secondary | ICD-10-CM | POA: Diagnosis not present

## 2023-06-20 DIAGNOSIS — Z7984 Long term (current) use of oral hypoglycemic drugs: Secondary | ICD-10-CM | POA: Diagnosis not present

## 2023-06-20 DIAGNOSIS — Z7901 Long term (current) use of anticoagulants: Secondary | ICD-10-CM | POA: Diagnosis not present

## 2023-06-20 DIAGNOSIS — I480 Paroxysmal atrial fibrillation: Secondary | ICD-10-CM | POA: Diagnosis not present

## 2023-06-21 NOTE — Telephone Encounter (Signed)
Received signed provider form from Dr. Isaiah Serge. MyChart message sent to patient to confirm household size and income.  Application placed in AWAITING RESPONSE folder

## 2023-06-25 ENCOUNTER — Encounter: Payer: Self-pay | Admitting: Pharmacist

## 2023-06-25 NOTE — Progress Notes (Signed)
[name] patient assistance application received in the mail from the patient and faxed to [employer]. Uploaded copy to documents.

## 2023-06-26 ENCOUNTER — Other Ambulatory Visit (INDEPENDENT_AMBULATORY_CARE_PROVIDER_SITE_OTHER): Payer: PPO | Admitting: Pharmacist

## 2023-06-26 DIAGNOSIS — E1151 Type 2 diabetes mellitus with diabetic peripheral angiopathy without gangrene: Secondary | ICD-10-CM

## 2023-06-26 NOTE — Telephone Encounter (Signed)
Submitted Patient Assistance RENEWAL Application to [facility] [employer] for OFEV along with provider portion, patient portion, PA, medication list, insurance card copy and income documents. Will update patient when we receive a response.  Phone #: [phone] Fax #: [phone]  Chesley Mires, PharmD, MPH, BCPS, CPP Clinical Pharmacist (Rheumatology and Pulmonology)

## 2023-06-26 NOTE — Progress Notes (Signed)
   07/02/2023 Name: Todd [name] MRN: [id number] DOB: 1941-01-27  Chief Complaint  Patient presents with   Diabetes   Medication Management   Medication Access    Todd [name] is a 82 y.o. year old male who presented for a telephone visit.   They were referred to the pharmacist by their PCP for assistance in managing diabetes and medication access.   Subjective:  Care Team: Primary Care Provider: [name], MD ; Next Scheduled Visit: 12/06/2023 Cardiologist: [name]; Next Scheduled Visit: 06/27/2023  Medication Access/Adherence  Current Pharmacy:  [facility] - [place], [place] - [address] [place]e [place] [address] Phone: [phone] Fax: [phone]   Patient reports affordability concerns with their medications: Yes  Patient reports access/transportation concerns to their pharmacy: No  Patient reports adherence concerns with their medications:  No    Xarelto and Marcelline Deist are around $150 per 30 day supply currently   Diabetes:  Current medications: [name] 10 mg daily, glipizide 5 mg 2 tablets twice daily, metformin XR 500 mg 1 tablet twice daily Medications tried in the past: metformin XR 500 mg 2 tablets twice daily - caused diarrhea, Rybelsus caused reduced appetite  *pt is on daily prednisone, likely contributing to elevated BG  Fasting BG 148, 87, 69, 89, 156, 80, 158, 187   Atrial Fibrillation:  Current medications: Xarelto 20 mg daily   Objective:  Lab Results  Component Value Date   HGBA1C 8.9 (H) 06/06/2023    Lab Results  Component Value Date   CREATININE 0.80 06/06/2023   BUN 25 (H) 06/06/2023   NA 141 06/06/2023   K 4.6 06/06/2023   CL 104 06/06/2023   CO2 29 06/06/2023    Lab Results  Component Value Date   CHOL 118 06/06/2023   HDL 44.70 06/06/2023   LDLCALC 52 06/06/2023   TRIG 106.0 06/06/2023   CHOLHDL 3 06/06/2023    Medications Reviewed Today   Medications were not  reviewed in this encounter      Assessment/Plan:   Diabetes: - Currently uncontrolled, Goal A1c <8.0% - [name] faxed 11/4 - received confirmation [name] PAP was approved, processed 11/7. Takes 10-14 business days to receive in the mail. Pt's wife informed. - Continue current regimen. Recommend A1c check in April.  Afib: - Was unable to get Xarelto through Xarelto [employer] Me program. They will ask cardio office for samples and pay the donut hole price if needed until the end of the year.   Follow Up Plan: PRN  [name], PharmD, BCPS Clinical Pharmacist [facility] [phone]

## 2023-06-27 ENCOUNTER — Ambulatory Visit: Payer: PPO | Admitting: Physician Assistant

## 2023-07-03 DIAGNOSIS — R3915 Urgency of urination: Secondary | ICD-10-CM | POA: Diagnosis not present

## 2023-07-03 DIAGNOSIS — N476 Balanoposthitis: Secondary | ICD-10-CM | POA: Diagnosis not present

## 2023-07-03 NOTE — Progress Notes (Unsigned)
Cardiology Office Note   Date:  07/05/2023  ID:  Todd [name], DOB 10/09/1940, MRN [id number] PCP:  Todd Sanes, MD [facility] Cardiologist: Todd Rotunda, MD  Reason for visit: 2 month follow-up  History of Present Illness    Todd [name] is a 82 y.o. male with a hx of pulmonary fibrosis followed at [facility], hypertension, hyperlipidemia, diabetes, CABG x4 and paroxysmal atrial fibrillation. He has LIMA to LAD, SVG to OM1, SVG to OM2, SVG to RCA. He was recently admitted from 01/07/2023 to 01/09/2023 for atrial fibrillation with RVR after presenting with chest pressure and was found to have an elevated troponin. High-sensitivity troponin peaked at 5,977. Echo showed LVEF of 55-60% with no regional wall motion abnormalities and mild LVH. He underwent LHC which showed patent LIMA to LAD, SVG to RCA, and SVG to OM2 with 65% stenosis of the proximal SVG. Elevated troponin was felt to be due to demand ischemia in the setting of rapid atrial fibrillation rather than true ACS. Continued medical therapy was recommended with consideration of PCI to SVG to OM2 if he has refractory angina. He was placed IV Diltiazem and converted back to sinus rhythm and then was transitioned back to PO Metoprolol. Of note, he had recently been place on Prednisone as well as an albuterol inhaler for chronic cough which may have contributed to recurrent atrial fibrillation.   He saw Dr. Antoine [name] in September 2024.  For paroxysmal atrial fibrillation, Dr. Antoine [name] increased metoprolol to 100 mg twice daily & asked to return in 2 month with HR log.  Today, he comes in with his daughter and wife.  Patient is hard of hearing.  His daughter helps fill in his history with me.  Patient denies palpitations.  They do not have his heart rate log today but says typically in the 80s.  Patient's had no recurrent chest pain.  He does have a chronic cough since having COVID 4 years ago.  They state concern of lower  extremity edema that started after his May hospitalization.  He states at one point he was taking Lasix 20 mg daily for 2 weeks -they cannot recall if this made a big difference.  He wears compression socks.  He states he does not add salt but eats snacks including [employer].  He denies PND, bloating and weight gain.  No significant lightheadedness, syncope or bleeding issues.  Patient mentions that he recently saw nephrologist that started silodosin to help " his bladder that does not empty well."  He mentions that he is on medication for yeast infection.         Objective / Physical Exam   Vital signs:  BP 122/72 (BP Location: Left Arm, Patient Position: Sitting, Cuff Size: Normal)   Pulse 72   Ht 5\' 8"  (1.727 m)   Wt 148 lb (67.1 kg)   BMI 22.50 kg/m     GEN: No acute distress, elderly, hard of hearing NECK: No carotid bruits CARDIAC: RRR, no murmurs RESPIRATORY:  Clear to auscultation without rales, wheezing or rhonchi  EXTREMITIES: 2+ LE edema bilaterally below the knees, mild bruising on UE  Assessment and Plan   Paroxysmal atrial fibrillation, asymptomatic -Zio monitor 01/2023: 10% A-fib/flutter burden, longest run 15 hours, baseline NSR -Continue rate control therapy with metoprolol tartrate 100 mg twice daily. -Continue stroke prevention with Xarelto 20 mg once daily. -Will refer to EP.  Dr. Antoine [name] has mention consideration of rhythm control given his frailty (Tikosyn).  Rhythm control may be difficult given history of pulmonary fibrosis.  Will start Lasix 40 mg once daily for volume control.  Bilateral lower extremity edema -Echo May 2024 with EF 55 to 60%, mild LVH, no significant valve disease normal LA size -Atrial fibrillation/loss of atrial kick may be contributing. -Start Lasix 40 mg once daily.  Check BMET in 1 week. -Continue compression socks. -Reduce salt intake.  Coronary artery disease, no angina -hx of CABG x4  -LHC 12/2022 showed patent LIMA to LAD, SVG to  RCA, and SVG to OM2 with 65% stenosis of the proximal SVG. Elevated troponin was felt to be due to demand ischemia in the setting of rapid atrial fibrillation rather than true ACS. Continued medical therapy was recommended with consideration of PCI to SVG to OM2 if he has refractory angina. -Continue lipid therapy.  Hypertension, well controlled -Current medications.  Hyperlipidemia with goal LDL less than 55 -LDL 52 in October 2024.  Continue Lipitor 80 mg daily.  Disposition - Follow-up in 1 month with me to follow-up LE edema.  Refer to EP to see if any further recommendations for A-fib management.   Signed, Cannon Kettle, PA-C  07/05/2023 [facility]

## 2023-07-05 ENCOUNTER — Encounter: Payer: Self-pay | Admitting: Physician Assistant

## 2023-07-05 ENCOUNTER — Ambulatory Visit: Payer: PPO | Attending: Physician Assistant | Admitting: Physician Assistant

## 2023-07-05 VITALS — BP 122/72 | HR 72 | Ht 68.0 in | Wt 148.0 lb

## 2023-07-05 DIAGNOSIS — E785 Hyperlipidemia, unspecified: Secondary | ICD-10-CM | POA: Diagnosis not present

## 2023-07-05 DIAGNOSIS — I4891 Unspecified atrial fibrillation: Secondary | ICD-10-CM

## 2023-07-05 DIAGNOSIS — I10 Essential (primary) hypertension: Secondary | ICD-10-CM | POA: Diagnosis not present

## 2023-07-05 DIAGNOSIS — I2510 Atherosclerotic heart disease of native coronary artery without angina pectoris: Secondary | ICD-10-CM

## 2023-07-05 MED ORDER — FUROSEMIDE 40 MG PO TABS: 40.0000 mg | ORAL_TABLET | Freq: Every day | ORAL | 3 refills | Status: DC

## 2023-07-05 NOTE — Patient Instructions (Signed)
Medication Instructions:  Start Lasix 40 mg ( Take 1 Tablet daily). *If you need a refill on your cardiac medications before your next appointment, please call your pharmacy*   Lab Work: BMET 1 Week If you have labs (blood work) drawn today and your tests are completely normal, you will receive your results only by: MyChart Message (if you have MyChart) OR A paper copy in the mail If you have any lab test that is abnormal or we need to change your treatment, we will call you to review the results.   Testing/Procedures: No Testing   Follow-Up: At [facility], you and your health needs are our priority.  As part of our continuing mission to provide you with exceptional heart care, we have created designated Provider Care Teams.  These Care Teams include your primary Cardiologist (physician) and Advanced Practice Providers (APPs -  Physician Assistants and Nurse Practitioners) who all work together to provide you with the care you need, when you need it.  We recommend signing up for the patient portal called "MyChart".  Sign up information is provided on this After Visit Summary.  MyChart is used to connect with patients for Virtual Visits (Telemedicine).  Patients are able to view lab/test results, encounter notes, upcoming appointments, etc.  Non-urgent messages can be sent to your provider as well.   To learn more about what you can do with MyChart, go to [email or web address].    Your next appointment:   1 month(s)  Provider:   [name], PA-C

## 2023-07-09 ENCOUNTER — Ambulatory Visit: Payer: PPO | Admitting: Internal Medicine

## 2023-07-09 ENCOUNTER — Encounter: Payer: Self-pay | Admitting: Internal Medicine

## 2023-07-09 VITALS — BP 110/64 | HR 72 | Ht 68.0 in | Wt 146.0 lb

## 2023-07-09 DIAGNOSIS — K5909 Other constipation: Secondary | ICD-10-CM

## 2023-07-09 DIAGNOSIS — K642 Third degree hemorrhoids: Secondary | ICD-10-CM

## 2023-07-09 NOTE — Progress Notes (Signed)
Todd [name] 82 y.o. [birth year] [id number]  Assessment & Plan:   Encounter Diagnoses  Name Primary?   Prolapsed internal hemorrhoids, grade 3 Yes   Chronic constipation    He has grade 3 prolapsed hemorrhoids that protrude with every attempt to defecate it sounds like.  He was able to reproduce this in the office today.  I am referring him to colorectal surgery to see what they might offer.  Given his comorbidities surgery could be contraindicated.  I have previously banded him several times but though that may be the only thing they could be offered since the surgeons are capable of more than I am I prefer they evaluate him and determine next steps.   Miralax bid instead of daily for constipation problems at this point.  Consider pharmacologic treatment with something like lubiprostone versus Linzess depending upon clinical course.  Another option would be to add intermittent bisacodyl or Senokot.  Subjective:   Chief Complaint: Constipation and rectal protrusion problems  HPI 82 year old white man with a history of hemorrhoids status post prior banding of hemorrhoids, diarrhea problems that improved after reduction in metformin dose, presents today with his wife complaining of constipation and "feels like my guts are coming out when I have a bowel movement".  He also has a history of pulmonary fibrosis status post COVID, peripheral vascular disease, stroke, coronary artery disease status post CABG and atrial fibrillation.  He is on anticoagulation., he says every time he goes to stool or sometimes shortly after he has protrusion from the anus and rectum and has to sit down and or manually reduce.  This can be somewhat painful or uncomfortable.  He goes several days without defecation at this point at times or just a small stools.  He is taking MiraLAX daily but that does not seem to be helping and said that somebody prescribed a stool softener recently but that was too expensive so he  did not purchase it.  He does take Ofev for his pulmonary fibrosis and thinks that may have contributed to prior diarrhea though he is clearly not having diarrhea anymore.  He is currently off of that for 2 weeks because he ran out but he has been having more problems with constipation and then diarrhea of late.   He was seen in cardiology on 07/05/2023 and he was referred to EP to see if rhythm control could potentially be helpful to him and Lasix 40 mg daily was started for volume control.  Colonoscopy 03/01/2022 for change in bowel habits (last visit with me) - One 5 mm polyp in the ascending colon, removed                            with a cold snare. Resected and retrieved.-Adenoma                           - One 1 mm polyp in the transverse colon, removed                            with a cold biopsy forceps. Resected and retrieved.-Adenoma                           - Internal hemorrhoids.                           -  The examination was otherwise normal on direct                            and retroflexion views. No routine repeat colonoscopy planned given age and these findings. Allergies  Allergen Reactions   Amlodipine Besylate     Rash Because of a history of documented adverse serious drug reaction;Medi Alert bracelet  is recommended   Iodinated Contrast Media Rash    Rash  Because of a history of documented adverse serious drug reaction;Medi Alert bracelet  is recommended  Rash, Because of a history of documented adverse serious drug reaction;Medi Alert bracelet  is recommended   Metformin And Related Diarrhea   Current Meds  Medication Sig   acetaminophen (TYLENOL) 650 MG CR tablet Take 650 mg by mouth every 8 (eight) hours as needed for pain.   amitriptyline (ELAVIL) 10 MG tablet Take 1 tablet (10 mg total) by mouth at bedtime.   atorvastatin (LIPITOR) 80 MG tablet Take 1 tablet (80 mg total) by mouth daily.   cholecalciferol (VITAMIN D3) 25 MCG (1000 UNIT) tablet Take  1,000 Units by mouth daily.   dapagliflozin propanediol ([name]) 10 MG TABS tablet Take 1 tablet (10 mg total) by mouth daily before breakfast.   famotidine (PEPCID) 40 MG tablet Take 2 tablets (80 mg total) by mouth at bedtime.   furosemide (LASIX) 40 MG tablet Take 1 tablet (40 mg total) by mouth daily.   glipiZIDE (GLUCOTROL) 5 MG tablet Take 2 tablets (10 mg total) by mouth 2 (two) times daily before a meal.   ipratropium (ATROVENT) 0.06 % nasal spray Place 2-4 sprays into both nostrils every 8 (eight) hours as needed for rhinitis.   metFORMIN (GLUCOPHAGE-XR) 500 MG 24 hr tablet Take 1 tablet (500 mg total) by mouth 2 (two) times daily with a meal.   metoprolol tartrate (LOPRESSOR) 100 MG tablet Take 1 tablet (100 mg total) by mouth 2 (two) times daily.   Multiple Vitamin (MULTIVITAMIN PO) Take 1 tablet by mouth daily.   Nintedanib (OFEV) 100 MG CAPS Take 1 capsule (100 mg total) by mouth 2 (two) times daily.   nitroGLYCERIN (NITROSTAT) 0.4 MG SL tablet Place 1 tablet (0.4 mg total) under the tongue every 5 (five) minutes as needed for chest pain.   Omega-3 Fatty Acids (FISH OIL PO) Take 1 tablet by mouth 2 (two) times daily.   omeprazole (PRILOSEC) 40 MG capsule TAKE 1 CAPSULE BY MOUTH 2 TIMES DAILY.   ONETOUCH DELICA LANCETS 33G MISC TEST BLOOD SUGAR ONCE DAILY   ONETOUCH VERIO test strip USE UP TO 4 TIMES A DAY AS DIRECTED   predniSONE (DELTASONE) 10 MG tablet TAKE 1 TABLET (10 MG TOTAL) BY MOUTH DAILY WITH BREAKFAST.   rivaroxaban (XARELTO) 20 MG TABS tablet Take 1 tablet (20 mg total) by mouth daily with supper.   silodosin (RAPAFLO) 8 MG CAPS capsule Take 8 mg by mouth daily with breakfast.   Past Medical History:  Diagnosis Date   Allergic rhinitis    Asthma    Atrial fibrillation with rapid ventricular response (HCC)    a. newly diagnosed 11/03/13, spont conv to NSR, placed on xarelto.   CAD (coronary artery disease)    a. 1999; s/p CABG: LIMA to LAD, SVG to OM1, SVG to OM2, SVG  to RCA  b. Normal nuc 10/2013 (done because of new onset AF.)   COVID    Diabetes mellitus (HCC)  HLD (hyperlipidemia)    HTN (hypertension)    Nephrolithiasis    Overweight(278.02)    PVD (peripheral vascular disease) (HCC)    Carotid stenosis   Stroke [facility])    Past Surgical History:  Procedure Laterality Date   CAROTID ENDARTERECTOMY  1999   COLONOSCOPY  2014   negative;Dr Jarold Motto   COLONOSCOPY W/ POLYPECTOMY  2009   Dr Jarold Motto   CORONARY ARTERY BYPASS GRAFT  Aug.1999   HEMORRHOID BANDING     INTRACAPSULAR CATARACT EXTRACTION Bilateral 2018   LEFT HEART CATH AND CORS/GRAFTS ANGIOGRAPHY N/A 01/08/2023   Procedure: LEFT HEART CATH AND CORS/GRAFTS ANGIOGRAPHY;  Surgeon: Swaziland, Peter M, MD;  Location: [facility] INVASIVE CV LAB;  Service: Cardiovascular;  Laterality: N/A;   NASAL SINUS SURGERY     Social History   Social History Narrative   Not on file   family history includes Asthma in his sister; Colon cancer (age of onset: 76) in his brother; Diabetes in his father; Heart attack (age of onset: 58) in his brother; Heart attack (age of onset: 67) in his brother and mother; Hemochromatosis in his brother; Prostate cancer in his brother; Stroke (age of onset: 49) in his father.   Review of Systems As per HPI  Objective:   Physical Exam BP 110/64   Pulse 72   Ht 5\' 8"  (1.727 m)   Wt 146 lb (66.2 kg)   BMI 22.20 kg/m   The abdomen is soft there is a small easily reducible umbilical hernia nontender nondistended abdomen. Rectal exam shows slight prominence of external hemorrhoid tissue, normal anoderm otherwise digital exam is nontender without mass or significant stool.  Functional rectal exam shows that he can tighten the anal sphincter on command and hold that to some degree with a moderate level of sphincter tightening I think, simulated defecation shows appropriate relaxation though he needed to be prompted more than once, with normal descent and appropriate relaxation.   There was no tissue prolapse with that.  Anoscopy was performed and showed prominent internal hemorrhoids with some external component, in all 3 positions.   I then had the patient moved to the bathroom where he strained to stool and had prolapse of tissue as seen in the photograph below.  I believe this is consistent with grade 3 prolapsed internal hemorrhoids plus or minus an external component, that he was able to manually reduce.

## 2023-07-09 NOTE — Patient Instructions (Signed)
Increase your over the counter Miralax to one dose twice daily.  You have been scheduled for an appointment with ______________ at [facility]. Your appointment is on ______________ at ________________. Please arrive at __________ for registration. Make certain to bring a list of current medications, including any over the counter medications or vitamins. Also bring your co-pay if you have one as well as your insurance cards. [facility] is located at [address], [address]. Should you need to reschedule your appointment, please contact them at [phone].   I appreciate the opportunity to care for you. Stan Head, MD, [facility]

## 2023-07-11 ENCOUNTER — Encounter: Payer: Self-pay | Admitting: Pulmonary Disease

## 2023-07-12 ENCOUNTER — Telehealth: Payer: Self-pay | Admitting: Pharmacist

## 2023-07-12 DIAGNOSIS — E785 Hyperlipidemia, unspecified: Secondary | ICD-10-CM | POA: Diagnosis not present

## 2023-07-12 DIAGNOSIS — E118 Type 2 diabetes mellitus with unspecified complications: Secondary | ICD-10-CM

## 2023-07-12 DIAGNOSIS — I2510 Atherosclerotic heart disease of native coronary artery without angina pectoris: Secondary | ICD-10-CM | POA: Diagnosis not present

## 2023-07-12 DIAGNOSIS — I10 Essential (primary) hypertension: Secondary | ICD-10-CM | POA: Diagnosis not present

## 2023-07-12 DIAGNOSIS — I4891 Unspecified atrial fibrillation: Secondary | ICD-10-CM | POA: Diagnosis not present

## 2023-07-12 MED ORDER — DAPAGLIFLOZIN PROPANEDIOL 10 MG PO TABS: 10.0000 mg | ORAL_TABLET | Freq: Every day | ORAL | Status: DC

## 2023-07-12 NOTE — Telephone Encounter (Signed)
Patient request Todd [name] and [name] samples. No Xarelto in stock. 2 boxes of [name] given

## 2023-07-13 LAB — BASIC METABOLIC PANEL
BUN/Creatinine Ratio: 25 — ABNORMAL HIGH (ref 10–24)
BUN: 23 mg/dL (ref 8–27)
CO2: 25 mmol/L (ref 20–29)
Calcium: 10.1 mg/dL (ref 8.6–10.2)
Chloride: 100 mmol/L (ref 96–106)
Creatinine, Ser: 0.92 mg/dL (ref 0.76–1.27)
Glucose: 297 mg/dL — ABNORMAL HIGH (ref 70–99)
Potassium: 5.1 mmol/L (ref 3.5–5.2)
Sodium: 139 mmol/L (ref 134–144)
eGFR: 84 mL/min/{1.73_m2} (ref >59)

## 2023-07-17 ENCOUNTER — Telehealth: Payer: Self-pay

## 2023-07-17 DIAGNOSIS — K642 Third degree hemorrhoids: Secondary | ICD-10-CM | POA: Diagnosis not present

## 2023-07-17 NOTE — Telephone Encounter (Addendum)
Results viewed by patient via MyChart.----- Message from Cannon Kettle sent at 07/16/2023  9:35 PM EST ----- Kidney function and electrolytes normal.  Safe to continue lasix for fluid retention.   Sugars elevated.  Continue diabetic medications, diabetic diet and follow-up with your PCP for diabetic control.

## 2023-07-23 NOTE — Telephone Encounter (Signed)
Per automated system at [facility], patient is approved to receive Ofev through 08/20/2024. Patient notified via MyChart  Chesley Mires, PharmD, MPH, BCPS, CPP Clinical Pharmacist (Rheumatology and Pulmonology)

## 2023-07-24 DIAGNOSIS — H18413 Arcus senilis, bilateral: Secondary | ICD-10-CM | POA: Diagnosis not present

## 2023-07-24 DIAGNOSIS — Z961 Presence of intraocular lens: Secondary | ICD-10-CM | POA: Diagnosis not present

## 2023-07-24 DIAGNOSIS — G245 Blepharospasm: Secondary | ICD-10-CM | POA: Diagnosis not present

## 2023-07-24 DIAGNOSIS — H16223 Keratoconjunctivitis sicca, not specified as Sjogren's, bilateral: Secondary | ICD-10-CM | POA: Diagnosis not present

## 2023-07-25 DIAGNOSIS — Z8616 Personal history of COVID-19: Secondary | ICD-10-CM | POA: Diagnosis not present

## 2023-07-25 DIAGNOSIS — R053 Chronic cough: Secondary | ICD-10-CM | POA: Diagnosis not present

## 2023-07-31 NOTE — Progress Notes (Unsigned)
Cardiology Office Note   Date:  08/01/2023  ID:  Todd [name], DOB [birth year], MRN [id number] PCP:  [name], MD [facility] Cardiologist: Rollene Rotunda, MD  Reason for visit: 1 month follow-up  History of Present Illness    Todd [name] is a 82 y.o. male with a hx of pulmonary fibrosis followed at [facility], hypertension, hyperlipidemia, diabetes, CABG x4 and paroxysmal atrial fibrillation. He has LIMA to LAD, SVG to OM1, SVG to OM2, SVG to RCA. He was recently admitted from 01/07/2023 to 01/09/2023 for atrial fibrillation with RVR after presenting with chest pressure and was found to have an elevated troponin. High-sensitivity troponin peaked at 5,977. Echo showed LVEF of 55-60% with no regional wall motion abnormalities and mild LVH. He underwent LHC which showed patent LIMA to LAD, SVG to RCA, and SVG to OM2 with 65% stenosis of the proximal SVG. Elevated troponin was felt to be due to demand ischemia in the setting of rapid atrial fibrillation rather than true ACS. Continued medical therapy was recommended with consideration of PCI to SVG to OM2 if he has refractory angina. He was placed IV Diltiazem and converted back to sinus rhythm and then was transitioned back to PO Metoprolol. Of note, he had recently been place on Prednisone as well as an albuterol inhaler for chronic cough which may have contributed to recurrent atrial fibrillation.    He saw Dr. Antoine Poche in September 2024.  For paroxysmal atrial fibrillation, Dr. Antoine Poche increased metoprolol to 100 mg twice daily & asked to return in 2 month with HR log.  I saw him in November 2024.  Patient was with his daughter and wife.  He is hard of hearing.  There were main concern was lower extremity edema that started after his May hospitalization.  Patient is noted to eat salty snacks.  I started him on Lasix 40 mg daily.  Referred to EP for A-fib management.  ZIO monitor in June 2024 showed 10% A-fib/flutter  burden.  Today, patient comes in with his wife and daughter.  They state that Lasix has not improved his LE edema much.  Instead has caused increased urination with occasional urinary accidents.  He states his mobility is limited by significant hemorrhoids.  He focuses on water intake.  He states he has difficulty putting on compression stockings so he does not use them.  Otherwise, patient states he feels heart racing 1-2 times per week that may last 30 minutes to 1 hour.  He denies specific triggers.  He denies associated symptoms like chest pain, shortness of breath and lightheadedness.  He is taking metoprolol tartrate 100 mg twice daily.  He brings in blood pressure and heart rate log.  Heart rates typically 70s to 80s.  Out of 55 readings, he has 7 readings with heart rate above 100.  Max heart rate 140 with blood pressure 109/78.  Otherwise, patient complains of difficulty with sleep.  He has a chronic cough and history of pulmonary fibrosis.  For his cough, he is starting prednisone and lidocaine injections every 6 weeks.  He had his first injection.  Objective / Physical Exam   Vital signs:  BP 120/64   Pulse 78   Ht 5\' 8"  (1.727 m)   Wt 145 lb (65.8 kg)   SpO2 96%   BMI 22.05 kg/m     GEN: No acute distress NECK: No carotid bruits CARDIAC: RRR, no murmurs RESPIRATORY:  dry crackles in R base, otherwise clear  EXTREMITIES: 2+ RLE edema below knee, 1+ LLE below knee  Assessment and Plan   Bilateral lower extremity edema -Echo May 2024 with EF 55 to 60%, mild LVH, no significant valve disease normal LA size -Atrial fibrillation/loss of atrial kick may be contributing. -Not improved with Lasix 40 mg daily -therefore will stop diuretics.  -Recommend going to medical supply store to find compression stockings or alternative that he can tolerate. Increase mobility as tolerated.  Paroxysmal atrial fibrillation, mildly symptomatic -Zio monitor 01/2023: 10% A-fib/flutter burden,  longest run 15 hours, baseline NSR -He sees EP for consult in Jan 2025.  Dr. Antoine Poche has mention consideration of rhythm control given his frailty (Tikosyn).  Rhythm control may be difficult given history of pulmonary fibrosis -Overall, symptoms seem to be mild and well-tolerated. -Continue rate control therapy with metoprolol tartrate 100 mg twice daily.  Baseline HR 70s-80s.   -Continue stroke prevention with Xarelto 20 mg once daily.  Coronary artery disease, no angina -hx of CABG x4  -LHC 12/2022 showed patent LIMA to LAD, SVG to RCA, and SVG to OM2 with 65% stenosis of the proximal SVG. Elevated troponin was felt to be due to demand ischemia in the setting of rapid atrial fibrillation rather than true ACS. Continued medical therapy was recommended with consideration of PCI to SVG to OM2 if he has refractory angina. -Continue Lipitor 80 mg daily.  No aspirin given need for anticoagulation.  Continue beta-blocker therapy.   Hypertension, well controlled -Current medications.   Hyperlipidemia with goal LDL less than 55 -LDL 52 in October 2024.  Continue Lipitor 80 mg daily.   Disposition - Follow-up in 4-6 months with Dr. Antoine Poche.    Signed, Cannon Kettle, PA-C  08/01/2023 [facility]

## 2023-08-01 ENCOUNTER — Encounter: Payer: Self-pay | Admitting: Physician Assistant

## 2023-08-01 ENCOUNTER — Ambulatory Visit: Payer: PPO | Attending: Physician Assistant | Admitting: Physician Assistant

## 2023-08-01 VITALS — BP 120/64 | HR 78 | Ht 68.0 in | Wt 145.0 lb

## 2023-08-01 DIAGNOSIS — I4891 Unspecified atrial fibrillation: Secondary | ICD-10-CM | POA: Diagnosis not present

## 2023-08-01 DIAGNOSIS — I2510 Atherosclerotic heart disease of native coronary artery without angina pectoris: Secondary | ICD-10-CM | POA: Diagnosis not present

## 2023-08-01 DIAGNOSIS — I10 Essential (primary) hypertension: Secondary | ICD-10-CM | POA: Diagnosis not present

## 2023-08-01 DIAGNOSIS — R600 Localized edema: Secondary | ICD-10-CM

## 2023-08-01 NOTE — Patient Instructions (Signed)
Medication Instructions:  Stop Lasix. *If you need a refill on your cardiac medications before your next appointment, please call your pharmacy*   Lab Work: No labs If you have labs (blood work) drawn today and your tests are completely normal, you will receive your results only by: MyChart Message (if you have MyChart) OR A paper copy in the mail If you have any lab test that is abnormal or we need to change your treatment, we will call you to review the results.   Testing/Procedures: No Testing   Follow-Up: At [facility], you and your health needs are our priority.  As part of our continuing mission to provide you with exceptional heart care, we have created designated Provider Care Teams.  These Care Teams include your primary Cardiologist (physician) and Advanced Practice Providers (APPs -  Physician Assistants and Nurse Practitioners) who all work together to provide you with the care you need, when you need it.  We recommend signing up for the patient portal called "MyChart".  Sign up information is provided on this After Visit Summary.  MyChart is used to connect with patients for Virtual Visits (Telemedicine).  Patients are able to view lab/test results, encounter notes, upcoming appointments, etc.  Non-urgent messages can be sent to your provider as well.   To learn more about what you can do with MyChart, go to [email or web address].    Your next appointment:   4-6 month(s)  Provider:   Rollene Rotunda, MD

## 2023-08-03 ENCOUNTER — Ambulatory Visit (INDEPENDENT_AMBULATORY_CARE_PROVIDER_SITE_OTHER): Payer: PPO

## 2023-08-03 DIAGNOSIS — Z0000 Encounter for general adult medical examination without abnormal findings: Secondary | ICD-10-CM

## 2023-08-03 NOTE — Patient Instructions (Addendum)
Todd [name] , Thank you for taking time to come for your Medicare Wellness Visit. I appreciate your ongoing commitment to your health goals. Please review the following plan we discussed and let me know if I can assist you in the future.   Referrals/Orders/Follow-Ups/Clinician Recommendations: none  This is a list of the screening recommended for you and due dates:  Health Maintenance  Topic Date Due   Eye exam for diabetics  11/30/2017   Complete foot exam   05/23/2022   COVID-19 Vaccine (4 - 2024-25 season) 08/19/2023*   Hemoglobin A1C  12/05/2023   Yearly kidney health urinalysis for diabetes  06/05/2024   Yearly kidney function blood test for diabetes  07/11/2024   Medicare Annual Wellness Visit  08/02/2024   DTaP/Tdap/Td vaccine (3 - Td or Tdap) 06/05/2033   Pneumonia Vaccine  Completed   Flu Shot  Completed   Zoster (Shingles) Vaccine  Completed   HPV Vaccine  Aged Out  *Topic was postponed. The date shown is not the original due date.    Advanced directives: (Copy Requested) Please bring a copy of your health care power of attorney and living will to the office to be added to your chart at your convenience.  Next Medicare Annual Wellness Visit scheduled for next year: Yes  insert Preventive Care attachment Insert FALL PREVENTION attachment if needed

## 2023-08-03 NOTE — Progress Notes (Signed)
Subjective:   Todd [name] is a 82 y.o. male who presents for Medicare Annual/Subsequent preventive examination.  Visit Complete: Virtual I connected with  Orman Allocco Dockery on 08/03/23 by a audio enabled telemedicine application and verified that I am speaking with the correct person using two identifiers. Wife was also on call.  Patient Location: Home  Provider Location: Office/Clinic  I discussed the limitations of evaluation and management by telemedicine. The patient expressed understanding and agreed to proceed.  Vital Signs: Because this visit was a virtual/telehealth visit, some criteria may be missing or patient reported. Any vitals not documented were not able to be obtained and vitals that have been documented are patient reported.    Cardiac Risk Factors include: advanced age (>43men, >55 women);diabetes mellitus;dyslipidemia;male gender;hypertension     Objective:    Today's Vitals   There is no height or weight on file to calculate BMI.     08/03/2023    3:55 PM 01/07/2023    7:08 PM 08/03/2022   10:19 AM 08/02/2021   10:50 AM 06/08/2020   10:51 AM 06/07/2020   12:48 PM 12/23/2018    1:50 PM  Advanced Directives  Does Patient Have a Medical Advance Directive? Yes Yes Yes Yes Yes Yes Yes  Type of Advance Directive Living will Living will Healthcare Power of [place];Living will Healthcare Power of [place];Living will Healthcare Power of [place];Living will Healthcare Power of [place];Living will Healthcare Power of [place];Living will  Does patient want to make changes to medical advance directive?   No - Patient declined No - Patient declined No - Patient declined No - Patient declined No - Patient declined  Copy of Healthcare Power of Attorney in Chart?   No - copy requested No - copy requested No - copy requested      Current Medications (verified) Outpatient Encounter Medications as of 08/03/2023  Medication Sig   acetaminophen (TYLENOL) 650 MG CR tablet  Take 650 mg by mouth every 8 (eight) hours as needed for pain.   atorvastatin (LIPITOR) 80 MG tablet Take 1 tablet (80 mg total) by mouth daily.   cholecalciferol (VITAMIN D3) 25 MCG (1000 UNIT) tablet Take 1,000 Units by mouth daily.   dapagliflozin propanediol ([name]) 10 MG TABS tablet Take 1 tablet (10 mg total) by mouth daily before breakfast.   famotidine (PEPCID) 40 MG tablet Take 2 tablets (80 mg total) by mouth at bedtime.   glipiZIDE (GLUCOTROL) 5 MG tablet Take 2 tablets (10 mg total) by mouth 2 (two) times daily before a meal.   ipratropium (ATROVENT) 0.06 % nasal spray Place 2-4 sprays into both nostrils every 8 (eight) hours as needed for rhinitis.   metFORMIN (GLUCOPHAGE-XR) 500 MG 24 hr tablet Take 1 tablet (500 mg total) by mouth 2 (two) times daily with a meal.   Multiple Vitamin (MULTIVITAMIN PO) Take 1 tablet by mouth daily.   nitroGLYCERIN (NITROSTAT) 0.4 MG SL tablet Place 1 tablet (0.4 mg total) under the tongue every 5 (five) minutes as needed for chest pain.   Omega-3 Fatty Acids (FISH OIL PO) Take 1 tablet by mouth 2 (two) times daily.   omeprazole (PRILOSEC) 40 MG capsule TAKE 1 CAPSULE BY MOUTH 2 TIMES DAILY.   ONETOUCH DELICA LANCETS 33G MISC TEST BLOOD SUGAR ONCE DAILY   ONETOUCH VERIO test strip USE UP TO 4 TIMES A DAY AS DIRECTED   predniSONE (DELTASONE) 10 MG tablet TAKE 1 TABLET (10 MG TOTAL) BY MOUTH DAILY WITH BREAKFAST.  rivaroxaban (XARELTO) 20 MG TABS tablet Take 1 tablet (20 mg total) by mouth daily with supper.   amitriptyline (ELAVIL) 10 MG tablet Take 1 tablet (10 mg total) by mouth at bedtime. (Patient not taking: Reported on 08/03/2023)   dapagliflozin propanediol ([name]) 10 MG TABS tablet Take 1 tablet (10 mg total) by mouth daily. (Patient not taking: Reported on 08/03/2023)   metoprolol tartrate (LOPRESSOR) 100 MG tablet Take 1 tablet (100 mg total) by mouth 2 (two) times daily.   Nintedanib (OFEV) 100 MG CAPS Take 1 capsule (100 mg total) by  mouth 2 (two) times daily. (Patient not taking: Reported on 08/03/2023)   silodosin (RAPAFLO) 8 MG CAPS capsule Take 8 mg by mouth daily with breakfast. (Patient not taking: Reported on 08/03/2023)   No facility-administered encounter medications on file as of 08/03/2023.    Allergies (verified) Amlodipine besylate, Iodinated contrast media, and Metformin and related   History: Past Medical History:  Diagnosis Date   Allergic rhinitis    Asthma    Atrial fibrillation with rapid ventricular response (HCC)    a. newly diagnosed 11/03/13, spont conv to NSR, placed on xarelto.   CAD (coronary artery disease)    a. 1999; s/p CABG: LIMA to LAD, SVG to OM1, SVG to OM2, SVG to RCA  b. Normal nuc 10/2013 (done because of new onset AF.)   COVID    Diabetes mellitus (HCC)    HLD (hyperlipidemia)    HTN (hypertension)    Nephrolithiasis    Overweight(278.02)    PVD (peripheral vascular disease) (HCC)    Carotid stenosis   Stroke [facility])    Past Surgical History:  Procedure Laterality Date   CAROTID ENDARTERECTOMY  1999   COLONOSCOPY  2014   negative;Dr Jarold Motto   COLONOSCOPY W/ POLYPECTOMY  2009   Dr Jarold Motto   CORONARY ARTERY BYPASS GRAFT  Aug.1999   HEMORRHOID BANDING     INTRACAPSULAR CATARACT EXTRACTION Bilateral 2018   LEFT HEART CATH AND CORS/GRAFTS ANGIOGRAPHY N/A 01/08/2023   Procedure: LEFT HEART CATH AND CORS/GRAFTS ANGIOGRAPHY;  Surgeon: [place], Peter M, MD;  Location: [facility] INVASIVE CV LAB;  Service: Cardiovascular;  Laterality: N/A;   NASAL SINUS SURGERY     Family History  Problem Relation Age of Onset   Heart attack Mother 40   Diabetes Father        IDDM   Stroke Father 28   Asthma Sister    Prostate cancer Brother    Hemochromatosis Brother    Colon cancer Brother 72   Heart attack Brother 27   Heart attack Brother 35   Esophageal cancer Neg Hx    Rectal cancer Neg Hx    Stomach cancer Neg Hx    Social History   Socioeconomic History   Marital status:  Married    Spouse name: Darel Hong   Number of children: 2   Years of education: Not on file   Highest education level: Not on file  Occupational History   Occupation: Airline pilot Manager-retired  Tobacco Use   Smoking status: Never   Smokeless tobacco: Never  Vaping Use   Vaping status: Never Used  Substance and Sexual Activity   Alcohol use: No    Alcohol/week: 0.0 standard drinks of alcohol   Drug use: No   Sexual activity: Yes  Other Topics Concern   Not on file  Social History Narrative   Not on file   Social Drivers of Health   Financial Resource Strain: Low Risk  (  08/03/2023)   Overall Financial Resource Strain (CARDIA)    Difficulty of Paying Living Expenses: Not hard at all  Food Insecurity: No Food Insecurity (08/03/2023)   Hunger Vital Sign    Worried About Running Out of Food in the Last Year: Never true    Ran Out of Food in the Last Year: Never true  Transportation Needs: No Transportation Needs (08/03/2023)   PRAPARE - Administrator, Civil Service (Medical): No    Lack of Transportation (Non-Medical): No  Physical Activity: Inactive (08/03/2023)   Exercise Vital Sign    Days of Exercise per Week: 0 days    Minutes of Exercise per Session: 0 min  Stress: No Stress Concern Present (08/03/2023)   [employer] of Occupational Health - Occupational Stress Questionnaire    Feeling of Stress : Not at all  Social Connections: Moderately Integrated (08/03/2023)   Social Connection and Isolation Panel [NHANES]    Frequency of Communication with Friends and Family: More than three times a week    Frequency of Social Gatherings with Friends and Family: Twice a week    Attends Religious Services: More than 4 times per year    Active Member of [employer] or Organizations: No    Attends Engineer, structural: Never    Marital Status: Married    Tobacco Counseling Counseling given: Not Answered   Clinical Intake:  Pre-visit preparation completed:  Yes  Pain : No/denies pain     Nutritional Risks: None Diabetes: Yes CBG done?: No Did pt. bring in CBG monitor from home?: No  How often do you need to have someone help you when you read instructions, pamphlets, or other written materials from your doctor or pharmacy?: 1 - Never  Interpreter Needed?: No  Information entered by :: NAllen LPN   Activities of Daily Living    08/03/2023    3:47 PM  In your present state of health, do you have any difficulty performing the following activities:  Hearing? 1  Comment has hearing aids  Vision? 1  Difficulty concentrating or making decisions? 0  Walking or climbing stairs? 1  Dressing or bathing? 0  Doing errands, shopping? 1  Comment wife Arts development officer and eating ? N  Using the Toilet? N  In the past six months, have you accidently leaked urine? Y  Do you have problems with loss of bowel control? Y  Managing your Medications? Y  Comment wife manages  Managing your Finances? N  Housekeeping or managing your Housekeeping? N    Patient Care Team: [name], MD as PCP - General (Internal Medicine) Rollene Rotunda, MD as PCP - Cardiology (Cardiology) Iva Boop, MD as Consulting Physician (Gastroenterology) Rollene Rotunda, MD as Consulting Physician (Cardiology) Tyrone Schimke, MD as Consulting Physician (Ophthalmology) Mia Creek, MD as Consulting Physician (Ophthalmology) Tyrone Schimke, MD as Consulting Physician (Ophthalmology)  Indicate any recent Medical Services you may have received from other than [facility] providers in the past year (date may be approximate).     Assessment:   This is a routine wellness examination for Coy.  Hearing/Vision screen Hearing Screening - Comments:: Has hearing aids that are maintained Vision Screening - Comments:: Regular eye exams, [facility]   Goals Addressed             This Visit's Progress    Patient Stated       08/03/2023,  get ankles to stop swelling  Depression Screen    08/03/2023    3:57 PM 01/17/2023    1:10 PM 08/03/2022   10:12 AM 05/18/2022   11:10 AM 02/07/2022    8:33 AM 08/02/2021   11:00 AM 02/09/2021   10:47 AM  PHQ 2/9 Scores  PHQ - 2 Score 0 0 0 0 0 0 0  PHQ- 9 Score     2      Fall Risk    08/03/2023    3:56 PM 01/17/2023    1:10 PM 08/03/2022   10:16 AM 06/23/2022    8:52 AM 05/18/2022   11:10 AM  Fall Risk   Falls in the past year? 0 0 0 0 0  Number falls in past yr: 0 0 0 0 0  Injury with Fall? 0 0 0 0 0  Risk for fall due to : Medication side effect No Fall Risks No Fall Risks No Fall Risks No Fall Risks  Follow up Falls prevention discussed;Falls evaluation completed Falls evaluation completed Falls evaluation completed Falls evaluation completed Falls evaluation completed    MEDICARE RISK AT HOME: Medicare Risk at Home Any stairs in or around the home?: Yes If so, are there any without handrails?: No Home free of loose throw rugs in walkways, pet beds, electrical cords, etc?: Yes Adequate lighting in your home to reduce risk of falls?: Yes Life alert?: No Use of a cane, walker or w/c?: No Grab bars in the bathroom?: Yes Shower chair or bench in shower?: Yes Elevated toilet seat or a handicapped toilet?: Yes  TIMED UP AND GO:  Was the test performed?  No    Cognitive Function:  6 CIT not administered. Patient has difficulty hearing to understand questions.      08/03/2022   10:19 AM 01/15/2015    2:34 PM  MMSE - Mini Mental State Exam  Not completed: Unable to complete Unable to complete        Immunizations Immunization History  Administered Date(s) Administered   Fluad Quad(high Dose 65+) 05/30/2019, 05/06/2021, 05/18/2022, 06/06/2023   Influenza Split 07/06/2011, 05/15/2012   Influenza Whole 05/12/2010   Influenza, High Dose Seasonal PF 05/30/2017, 05/02/2018, 06/12/2020   Influenza,inj,Quad PF,6+ Mos 05/21/2013   Influenza-Unspecified  05/20/2014, 06/11/2015, 05/24/2016   PFIZER(Purple Top)SARS-COV-2 Vaccination 10/13/2019, 11/03/2019, 10/12/2020   Pneumococcal Conjugate-13 01/15/2015   Pneumococcal Polysaccharide-23 05/22/2005, 05/05/2010   RSV,unspecified 06/06/2023   Td 06/06/2023   Tdap 12/03/2012   Zoster Recombinant(Shingrix) 12/19/2017, 03/04/2018   Zoster, Live 10/20/2010    TDAP status: Up to date  Flu Vaccine status: Up to date  Pneumococcal vaccine status: Up to date  Covid-19 vaccine status: Information provided on how to obtain vaccines.   Qualifies for Shingles Vaccine? Yes   Zostavax completed Yes   Shingrix Completed?: Yes  Screening Tests Health Maintenance  Topic Date Due   OPHTHALMOLOGY EXAM  11/30/2017   FOOT EXAM  05/23/2022   COVID-19 Vaccine (4 - 2024-25 season) 08/19/2023 (Originally 04/22/2023)   HEMOGLOBIN A1C  12/05/2023   Diabetic kidney evaluation - Urine ACR  06/05/2024   Diabetic kidney evaluation - eGFR measurement  07/11/2024   Medicare Annual Wellness (AWV)  08/02/2024   DTaP/Tdap/Td (3 - Td or Tdap) 06/05/2033   Pneumonia Vaccine 68+ Years old  Completed   INFLUENZA VACCINE  Completed   Zoster Vaccines- Shingrix  Completed   HPV VACCINES  Aged Out    Health Maintenance  Health Maintenance Due  Topic Date Due   OPHTHALMOLOGY EXAM  11/30/2017   FOOT EXAM  05/23/2022    Colorectal cancer screening: No longer required.   Lung Cancer Screening: (Low Dose CT Chest recommended if Age 1-80 years, 20 pack-year currently smoking OR have quit w/in 15years.) does not qualify.   Lung Cancer Screening Referral: no  Additional Screening:  Hepatitis C Screening: does not qualify;  Vision Screening: Recommended annual ophthalmology exams for early detection of glaucoma and other disorders of the eye. Is the patient up to date with their annual eye exam?  Yes  Who is the provider or what is the name of the office in which the patient attends annual eye exams? [facility]  Eye If pt is not established with a provider, would they like to be referred to a provider to establish care? No .   Dental Screening: Recommended annual dental exams for proper oral hygiene  Diabetic Foot Exam: Diabetic Foot Exam: Overdue, Pt has been advised about the importance in completing this exam. Pt is scheduled for diabetic foot exam on next appointment.  Community Resource Referral / Chronic Care Management: CRR required this visit?  No   CCM required this visit?  No     Plan:     I have personally reviewed and noted the following in the patient's chart:   Medical and social history Use of alcohol, tobacco or illicit drugs  Current medications and supplements including opioid prescriptions. Patient is not currently taking opioid prescriptions. Functional ability and status Nutritional status Physical activity Advanced directives List of other physicians Hospitalizations, surgeries, and ER visits in previous 12 months Vitals Screenings to include cognitive, depression, and falls Referrals and appointments  In addition, I have reviewed and discussed with patient certain preventive protocols, quality metrics, and best practice recommendations. A written personalized care plan for preventive services as well as general preventive health recommendations were provided to patient.     Barb Merino, LPN   56/21/3086   After Visit Summary: (MyChart) Due to this being a telephonic visit, the after visit summary with patients personalized plan was offered to patient via MyChart   Nurse Notes: none

## 2023-08-06 DIAGNOSIS — H16223 Keratoconjunctivitis sicca, not specified as Sjogren's, bilateral: Secondary | ICD-10-CM | POA: Diagnosis not present

## 2023-08-06 DIAGNOSIS — G245 Blepharospasm: Secondary | ICD-10-CM | POA: Diagnosis not present

## 2023-08-06 DIAGNOSIS — E119 Type 2 diabetes mellitus without complications: Secondary | ICD-10-CM | POA: Diagnosis not present

## 2023-08-06 DIAGNOSIS — Z961 Presence of intraocular lens: Secondary | ICD-10-CM | POA: Diagnosis not present

## 2023-08-24 ENCOUNTER — Institutional Professional Consult (permissible substitution): Payer: PPO | Admitting: Cardiology

## 2023-08-28 ENCOUNTER — Ambulatory Visit: Payer: Self-pay | Admitting: Internal Medicine

## 2023-08-28 NOTE — Telephone Encounter (Signed)
  Chief Complaint: Cough Symptoms: chronic cough Frequency: going on for four years Pertinent Negatives: Patient denies chest pain Disposition: [] ED /[] Urgent Care (no appt availability in office) / [] Appointment(In office/virtual)/ []  [facility]/ [] Home Care/ [] Refused Recommended Disposition /[] [facility]/ [x]  Follow-up with PCP Additional Notes: patient's wife calling about patient who is heard on the phone. Patient has pulmonary fibrosis-wife is concerned because she tested positive for COVID on Friday and he was negative. She states that he has been coughing which is his chronic cough. She is concerned whether or not he needs to be tested again and if he might need a chest x-ray. Patient is heard arguing with his wife asking why are you on the phone? I feel fine Patient denies chest pain. Verbalizes that he cough up sputum and he feels better. Wife would like a phone call from staff for guidance in what should be done.    Copied from CRM [id number]. Topic: Clinical - Red Word Triage >> Aug 28, 2023  4:56 PM Merlynn A wrote: Red Word that prompted transfer to Nurse Triage: Temperature, Chest Pain Reason for Disposition  Cough lasts > 3 weeks  Answer Assessment - Initial Assessment Questions 1. ONSET: When did the cough begin?      Chronic cough 2. SEVERITY: How bad is the cough today?      The cough is the same 3. SPUTUM: Describe the color of your sputum (none, dry cough; clear, white, yellow, green)     clear 4. HEMOPTYSIS: Are you coughing up any blood? If so ask: How much? (flecks, streaks, tablespoons, etc.)     No 5. DIFFICULTY BREATHING: Are you having difficulty breathing? If Yes, ask: How bad is it? (e.g., mild, moderate, severe)    - MILD: No SOB at rest, mild SOB with walking, speaks normally in sentences, can lie down, no retractions, pulse < 100.    - MODERATE: SOB at rest, SOB with minimal exertion and prefers to sit, cannot lie down flat,  speaks in phrases, mild retractions, audible wheezing, pulse 100-120.    - SEVERE: Very SOB at rest, speaks in single words, struggling to breathe, sitting hunched forward, retractions, pulse > 120      No 6. FEVER: Do you have a fever? If Yes, ask: What is your temperature, how was it measured, and when did it start?     99.9 7. CARDIAC HISTORY: Do you have any history of heart disease? 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 LUNG HISTORY: Do you have any history of lung disease?  (e.g., pulmonary embolus, asthma, emphysema)     Hx of COVID-covid cough 9. PE RISK FACTORS: Do you have a history of blood clots? (or: recent major surgery, recent prolonged travel, bedridden)     No 10. OTHER SYMPTOMS: Do you have any other symptoms? (e.g., runny nose, wheezing, chest pain)       No  Protocols used: Cough - Chronic-A-AH

## 2023-08-30 NOTE — Telephone Encounter (Signed)
 Spoke with patient and spouse today.  Patient feeling much better and will call for appointment if he gets worse.

## 2023-09-05 DIAGNOSIS — R053 Chronic cough: Secondary | ICD-10-CM | POA: Diagnosis not present

## 2023-09-05 DIAGNOSIS — J385 Laryngeal spasm: Secondary | ICD-10-CM | POA: Diagnosis not present

## 2023-09-05 DIAGNOSIS — R1314 Dysphagia, pharyngoesophageal phase: Secondary | ICD-10-CM | POA: Diagnosis not present

## 2023-09-16 ENCOUNTER — Encounter: Payer: Self-pay | Admitting: Internal Medicine

## 2023-09-18 ENCOUNTER — Encounter: Payer: Self-pay | Admitting: Internal Medicine

## 2023-09-18 NOTE — Progress Notes (Unsigned)
Subjective:    Patient ID: Todd [name], male    DOB: 08-Jan-1941, 83 y.o.   MRN: [id number]      HPI Todd [name] is here for No chief complaint on file.  He is here today with his daughter and wife who helps supplement the history.  Neuro referral - ? Parkinson's  - some symptoms his family has noticed are stumbling/leg weakness, mouth/head tremor , tired, weight loss,excessive urination, choking on nearly nothing, constipation, sleep issues, smelling decreased, writing is more difficult -shaking with writing - messier, eye issues.  These symptoms are similar to a friends who has Parkinson's Dz.    No falls.  No memory concerns  Cough - better with steroid injections in [place] - getting laryngeal nerve block for his chronic cough ( laryngeal spasm, chronic cough, pharyngoesophageal dysphagia)  He has not restarted his pulmonary fibrosis medication-they have not been able to get the medication and need to get back into see pulmonary.  Back pain -  started 4 days ago.  He denies any falls or injuries.  He states the pain just started.  Pain in lower back and toward to left-now more centered in 1 spot without radiation around when the SI joint is.  He has applied heat.  It is a little better.  No buttok or leg pain.  No n/t.  Movement inc pain.  Better with sitting but still has pain.  Tylenol helped a little.  Pain is 8/10.  He denies any prior back issues.  Sugars have been variable because of the steroid injections.  He is taking his medication as prescribed.   Medications and allergies reviewed with patient and updated if appropriate.  Current Outpatient Medications on File Prior to Visit  Medication Sig Dispense Refill   acetaminophen (TYLENOL) 650 MG CR tablet Take 650 mg by mouth every 8 (eight) hours as needed for pain.     amitriptyline (ELAVIL) 10 MG tablet Take 1 tablet (10 mg total) by mouth at bedtime.     atorvastatin (LIPITOR) 80 MG tablet Take 1 tablet (80 mg total) by  mouth daily. 90 tablet 3   cholecalciferol (VITAMIN D3) 25 MCG (1000 UNIT) tablet Take 1,000 Units by mouth daily.     dapagliflozin propanediol ([name]) 10 MG TABS tablet Take 1 tablet (10 mg total) by mouth daily before breakfast. 30 tablet 5   dapagliflozin propanediol ([name]) 10 MG TABS tablet Take 1 tablet (10 mg total) by mouth daily.     famotidine (PEPCID) 40 MG tablet Take 2 tablets (80 mg total) by mouth at bedtime.     glipiZIDE (GLUCOTROL) 5 MG tablet Take 2 tablets (10 mg total) by mouth 2 (two) times daily before a meal. 360 tablet 1   ipratropium (ATROVENT) 0.06 % nasal spray Place 2-4 sprays into both nostrils every 8 (eight) hours as needed for rhinitis.     metFORMIN (GLUCOPHAGE-XR) 500 MG 24 hr tablet Take 1 tablet (500 mg total) by mouth 2 (two) times daily with a meal. 60 tablet 5   Multiple Vitamin (MULTIVITAMIN PO) Take 1 tablet by mouth daily.     Nintedanib (OFEV) 100 MG CAPS Take 1 capsule (100 mg total) by mouth 2 (two) times daily. 180 capsule 1   nitroGLYCERIN (NITROSTAT) 0.4 MG SL tablet Place 1 tablet (0.4 mg total) under the tongue every 5 (five) minutes as needed for chest pain. 100 tablet 3   Omega-3 Fatty Acids (FISH OIL PO) Take 1 tablet by  mouth 2 (two) times daily.     omeprazole (PRILOSEC) 40 MG capsule TAKE 1 CAPSULE BY MOUTH 2 TIMES DAILY. 120 capsule 5   ONETOUCH DELICA LANCETS 33G MISC TEST BLOOD SUGAR ONCE DAILY 100 each 3   ONETOUCH VERIO test strip USE UP TO 4 TIMES A DAY AS DIRECTED 100 strip 0   predniSONE (DELTASONE) 10 MG tablet TAKE 1 TABLET (10 MG TOTAL) BY MOUTH DAILY WITH BREAKFAST. 30 tablet 5   rivaroxaban (XARELTO) 20 MG TABS tablet Take 1 tablet (20 mg total) by mouth daily with supper. 30 tablet 2   silodosin (RAPAFLO) 8 MG CAPS capsule Take 8 mg by mouth daily with breakfast.     metoprolol tartrate (LOPRESSOR) 100 MG tablet Take 1 tablet (100 mg total) by mouth 2 (two) times daily. 90 tablet 3   No current facility-administered  medications on file prior to visit.    Review of Systems  Constitutional:  Negative for fever.  Respiratory:  Positive for cough (chronic) and shortness of breath (chronic - not worse). Negative for wheezing.   Cardiovascular:  Positive for chest pain (one episode 10 days ago - ntg worked - burning in chest), palpitations (occ) and leg swelling.  Musculoskeletal:  Positive for gait problem.  Neurological:  Positive for weakness. Negative for light-headedness and headaches.       Objective:   Vitals:   09/19/23 1057  BP: (!) 142/78  Pulse: 63  Temp: 98.5 F (36.9 C)  SpO2: 95%   BP Readings from Last 3 Encounters:  09/19/23 (!) 142/78  08/01/23 120/64  07/09/23 110/64   Wt Readings from Last 3 Encounters:  09/19/23 142 lb (64.4 kg)  08/01/23 145 lb (65.8 kg)  07/09/23 146 lb (66.2 kg)   Body mass index is 21.59 kg/m.    Physical Exam Constitutional:      General: He is not in acute distress.    Appearance: He is not ill-appearing.     Comments: Frail appearing  HENT:     Head: Normocephalic and atraumatic.  Cardiovascular:     Rate and Rhythm: Normal rate and regular rhythm.  Pulmonary:     Effort: Pulmonary effort is normal.     Breath sounds: Normal breath sounds.  Musculoskeletal:     Right lower leg: Edema (1+ pitting) present.     Left lower leg: Edema (1+ pitting) present.  Skin:    General: Skin is warm and dry.     Findings: No rash.     Comments: Stage I pressure sore sacral area  Neurological:     Mental Status: He is alert and oriented to person, place, and time.     Sensory: No sensory deficit.     Motor: Weakness (Bilateral proximal and lower leg weakness, 4/5, no upper extremity weakness) present.     Gait: Gait abnormal (Difficulty lifting legs-right leg is weaker than left leg).            Assessment & Plan:    See Problem List for Assessment and Plan of chronic medical problems.

## 2023-09-19 ENCOUNTER — Encounter: Payer: Self-pay | Admitting: Internal Medicine

## 2023-09-19 ENCOUNTER — Ambulatory Visit (INDEPENDENT_AMBULATORY_CARE_PROVIDER_SITE_OTHER): Payer: PPO

## 2023-09-19 ENCOUNTER — Ambulatory Visit (INDEPENDENT_AMBULATORY_CARE_PROVIDER_SITE_OTHER): Payer: PPO | Admitting: Internal Medicine

## 2023-09-19 VITALS — BP 142/78 | HR 63 | Temp 98.5°F | Ht 68.0 in | Wt 142.0 lb

## 2023-09-19 DIAGNOSIS — I10 Essential (primary) hypertension: Secondary | ICD-10-CM

## 2023-09-19 DIAGNOSIS — R29898 Other symptoms and signs involving the musculoskeletal system: Secondary | ICD-10-CM | POA: Diagnosis not present

## 2023-09-19 DIAGNOSIS — M4856XA Collapsed vertebra, not elsewhere classified, lumbar region, initial encounter for fracture: Secondary | ICD-10-CM | POA: Diagnosis not present

## 2023-09-19 DIAGNOSIS — M545 Low back pain, unspecified: Secondary | ICD-10-CM | POA: Diagnosis not present

## 2023-09-19 DIAGNOSIS — S32030A Wedge compression fracture of third lumbar vertebra, initial encounter for closed fracture: Secondary | ICD-10-CM | POA: Insufficient documentation

## 2023-09-19 DIAGNOSIS — L89151 Pressure ulcer of sacral region, stage 1: Secondary | ICD-10-CM

## 2023-09-19 DIAGNOSIS — E1151 Type 2 diabetes mellitus with diabetic peripheral angiopathy without gangrene: Secondary | ICD-10-CM | POA: Diagnosis not present

## 2023-09-19 DIAGNOSIS — R634 Abnormal weight loss: Secondary | ICD-10-CM

## 2023-09-19 DIAGNOSIS — Z7984 Long term (current) use of oral hypoglycemic drugs: Secondary | ICD-10-CM

## 2023-09-19 DIAGNOSIS — R251 Tremor, unspecified: Secondary | ICD-10-CM

## 2023-09-19 DIAGNOSIS — M47816 Spondylosis without myelopathy or radiculopathy, lumbar region: Secondary | ICD-10-CM | POA: Diagnosis not present

## 2023-09-19 DIAGNOSIS — M48061 Spinal stenosis, lumbar region without neurogenic claudication: Secondary | ICD-10-CM | POA: Diagnosis not present

## 2023-09-19 LAB — COMPREHENSIVE METABOLIC PANEL WITH GFR
ALT: 33 U/L (ref 0–53)
AST: 28 U/L (ref 0–37)
Albumin: 3.8 g/dL (ref 3.5–5.2)
Alkaline Phosphatase: 109 U/L (ref 39–117)
BUN: 28 mg/dL — ABNORMAL HIGH (ref 6–23)
CO2: 26 meq/L (ref 19–32)
Calcium: 9.7 mg/dL (ref 8.4–10.5)
Chloride: 102 meq/L (ref 96–112)
Creatinine, Ser: 0.81 mg/dL (ref 0.40–1.50)
GFR: 83.19 mL/min
Glucose, Bld: 261 mg/dL — ABNORMAL HIGH (ref 70–99)
Potassium: 4.9 meq/L (ref 3.5–5.1)
Sodium: 137 meq/L (ref 135–145)
Total Bilirubin: 0.7 mg/dL (ref 0.2–1.2)
Total Protein: 7.3 g/dL (ref 6.0–8.3)

## 2023-09-19 LAB — CBC WITH DIFFERENTIAL/PLATELET
Basophils Absolute: 0.1 10*3/uL (ref 0.0–0.1)
Basophils Relative: 0.6 % (ref 0.0–3.0)
Eosinophils Absolute: 0 10*3/uL (ref 0.0–0.7)
Eosinophils Relative: 0.2 % (ref 0.0–5.0)
HCT: 43.1 % (ref 39.0–52.0)
Hemoglobin: 14.3 g/dL (ref 13.0–17.0)
Lymphocytes Relative: 18.6 % (ref 12.0–46.0)
Lymphs Abs: 2 10*3/uL (ref 0.7–4.0)
MCHC: 33.3 g/dL (ref 30.0–36.0)
MCV: 103.9 fL — ABNORMAL HIGH (ref 78.0–100.0)
Monocytes Absolute: 0.8 10*3/uL (ref 0.1–1.0)
Monocytes Relative: 7.5 % (ref 3.0–12.0)
Neutro Abs: 7.8 10*3/uL — ABNORMAL HIGH (ref 1.4–7.7)
Neutrophils Relative %: 73.1 % (ref 43.0–77.0)
Platelets: 212 10*3/uL (ref 150.0–400.0)
RBC: 4.15 Mil/uL — ABNORMAL LOW (ref 4.22–5.81)
RDW: 16.3 % — ABNORMAL HIGH (ref 11.5–15.5)
WBC: 10.7 10*3/uL — ABNORMAL HIGH (ref 4.0–10.5)

## 2023-09-19 LAB — LIPID PANEL
Cholesterol: 126 mg/dL (ref 0–200)
HDL: 38.2 mg/dL — ABNORMAL LOW
LDL Cholesterol: 51 mg/dL (ref 0–99)
NonHDL: 87.48
Total CHOL/HDL Ratio: 3
Triglycerides: 183 mg/dL — ABNORMAL HIGH (ref 0.0–149.0)
VLDL: 36.6 mg/dL (ref 0.0–40.0)

## 2023-09-19 LAB — HEMOGLOBIN A1C: Hgb A1c MFr Bld: 11 % — ABNORMAL HIGH (ref 4.6–6.5)

## 2023-09-19 LAB — TSH: TSH: 2.87 u[IU]/mL (ref 0.35–5.50)

## 2023-09-19 MED ORDER — FUROSEMIDE 40 MG PO TABS: 40.0000 mg | ORAL_TABLET | Freq: Every day | ORAL | Status: DC

## 2023-09-19 NOTE — Assessment & Plan Note (Signed)
New Stage I sacral pressure ulcer They do have a donut pillow that he can use-discussed they can also get an air pad which may help-what ever is more comfortable Stressed the only way to help prevent this from getting worse is to avoid pressure on the area

## 2023-09-19 NOTE — Assessment & Plan Note (Signed)
Chronic Continues to lose weight Will check TSH, CBC, CMP Initially this was thought to be related to the [facility] he was taking for the pulmonary fibrosis, but has been off of that.

## 2023-09-19 NOTE — Patient Instructions (Addendum)
   Elevate legs   Xrays today     Medications changes include :   lasix 40 gm daily x 7 days    A referral was ordered Dr Terrace Arabia and someone will call you to schedule an appointment.     Return for follow up as scheduled.

## 2023-09-19 NOTE — Assessment & Plan Note (Addendum)
Acute Started 4 days ago No accident or injury Seems to be located in the left SI joint He thinks it has gotten a little bit better Will get x-rays today Discussed treatment options and at this point he will just continue the Tylenol and heat and if the pain does not improve he will let me know

## 2023-09-19 NOTE — Assessment & Plan Note (Signed)
Acute on chronic Bilateral leg swelling has gotten worse He is sitting a lot of the day and sometimes elevates his legs but often has his legs down Stressed that he needs to elevate the legs, ideally wear compression socks Start Lasix 40 mg daily for 1 week to see if that helps-he has been on this in the past and it did not seem to help tremendously, but hopefully her higher dose will help They can update me with his progress leg swelling and we can revise medication as needed

## 2023-09-19 NOTE — Assessment & Plan Note (Signed)
Chronic  Lab Results  Component Value Date   HGBA1C 8.9 (H) 06/06/2023   Sugars not controlled He is on prednisone daily and getting steroid injections every 4 weeks Check A1c Continue metformin XR 500 mg twice daily, glipizide 10 mg bid AC and [name] 10 mg daily Diabetic diet

## 2023-09-19 NOTE — Assessment & Plan Note (Signed)
Chronic Blood pressure slightly elevated here today, but typically much lower  cmp, CBC No change in medications Continue metoprolol 100 mg twice daily

## 2023-09-19 NOTE — Assessment & Plan Note (Signed)
New Daughter has noticed mouth / head tremor Concerned about possible Parkinson's given other symptoms Referral to neurology

## 2023-09-20 ENCOUNTER — Other Ambulatory Visit: Payer: Self-pay | Admitting: Cardiology

## 2023-09-20 DIAGNOSIS — I4891 Unspecified atrial fibrillation: Secondary | ICD-10-CM

## 2023-09-20 MED ORDER — LANTUS SOLOSTAR 100 UNIT/ML ~~LOC~~ SOPN: 15.0000 [IU] | PEN_INJECTOR | Freq: Every day | SUBCUTANEOUS | 3 refills | Status: DC

## 2023-09-20 MED ORDER — PEN NEEDLES 32G X 5 MM MISC: 3 refills | Status: DC

## 2023-09-20 NOTE — Telephone Encounter (Signed)
Prescription refill request for Xarelto received.  Indication: PAF Last office visit: 08/01/23  Verneita Griffes PA-C Weight: 16.1WR Age: 83 Scr: 0.81 on 09/19/23  Epic CrCl:  67.70  Based on above findings Xarelto 20mg  daily is the appropriate dose.  Refill approved.

## 2023-09-24 ENCOUNTER — Encounter: Payer: Self-pay | Admitting: Internal Medicine

## 2023-09-24 DIAGNOSIS — R29898 Other symptoms and signs involving the musculoskeletal system: Secondary | ICD-10-CM

## 2023-09-24 DIAGNOSIS — R251 Tremor, unspecified: Secondary | ICD-10-CM

## 2023-09-26 ENCOUNTER — Encounter: Payer: Self-pay | Admitting: Cardiology

## 2023-09-26 ENCOUNTER — Ambulatory Visit: Payer: PPO | Attending: Cardiology | Admitting: Cardiology

## 2023-09-26 VITALS — BP 126/76 | HR 85 | Ht 68.0 in | Wt 142.0 lb

## 2023-09-26 DIAGNOSIS — I4891 Unspecified atrial fibrillation: Secondary | ICD-10-CM

## 2023-09-26 NOTE — Progress Notes (Signed)
 Electrophysiology Office Note:    Date:  09/26/2023   ID:  [name], DOB 1941/05/23, MRN [id number]  [facility] Cardiologist:  Todd Schilling, MD  [facility] Electrophysiologist:  OLE ONEIDA HOLTS, MD   Referring MD: Holts Delon POUR, PA*   Chief Complaint: Atrial fibrillation  History of Present Illness:    [name] is an 83 year old man who I am seeing today for an evaluation of atrial fibrillation at the request of Dr. Schilling.  The patient has an extensive past medical history that includes hypertension, hyperlipidemia, diabetes, coronary artery disease post CABG.  He has been hospitalized with atrial fibrillation as recently as May 2024.  He has had trouble with edema and is being treated with Lasix .  He takes Xarelto  for stroke prophylaxis.  The patient reports a low burden of atrial fibrillation but each time he goes into A-fib he is highly symptomatic with chest discomfort and palpitations.  He takes Xarelto  for stroke prophylaxis but is concerned about the significant bruising he experiences while taking the drug.  He also shows me an actively bleeding right upper extremity wound that occurred when he brushed up against a wall.  He is interested in pursuing left atrial appendage occlusion/watchman.      Their past medical, social and family history was reviewed.   ROS:   Please see the history of present illness.    All other systems reviewed and are negative.  EKGs/Labs/Other Studies Reviewed:    The following studies were reviewed today:  April 27, 2023 EKG reviewed and shows atrial fibrillation.  QTc is 408 ms.  September 19, 2023 creatinine 0.81  Jan 19, 2023 EKG reviewed and shows sinus rhythm with PACs.  QTc is 450 ms.  February 08, 2023 ZIO monitor shows 10% burden of atrial fibrillation and flutter  Jan 08, 2023 echo EF 55-60 RV normal Trivial MR        Physical Exam:    VS:  BP 126/76   Pulse 85   Ht 5' 8 (1.727 m)   Wt 142 lb  (64.4 kg)   SpO2 90%   BMI 21.59 kg/m     Wt Readings from Last 3 Encounters:  09/26/23 142 lb (64.4 kg)  09/19/23 142 lb (64.4 kg)  08/01/23 145 lb (65.8 kg)     GEN: no distress.  Actively bleeding wound from his right upper extremity with heavy surrounding bruising. CARD: RRR, No MRG RESP: No IWOB. CTAB.        ASSESSMENT AND PLAN:    1. Atrial fibrillation, unspecified type (HCC)      #Persistent atrial fibrillation and flutter Poorly tolerated.  Thought to be contributing to decompensated diastolic heart failure.  He takes Xarelto  for stroke prophylaxis.  I discussed rhythm control strategies with the patient during today's clinic appointment including antiarrhythmic drugs and catheter ablation.  He is a good candidate for Tikosyn  given his normal kidney function and QTc. If he elects to proceed with Tikosyn , he would need to stop his amitriptyline .  Today the patient expresses a strong interest to stop his anticoagulant given significant bruising/bleeding with minimal trauma.  Also the excessive costs.  They inquired about the watchman implant.  ---------------------  I have seen Todd [name] in the office today who is being considered for a Watchman left atrial appendage closure device. I believe they will benefit from this procedure given their history of atrial fibrillation, CHA2DS2-VASc score of 5. Unfortunately, the patient is not felt to be  a long term anticoagulation candidate secondary to excessive bruising and bleeding with minimal injury. The patient's chart has been reviewed and I feel that they would be a candidate for short term oral anticoagulation after Watchman implant.   It is my belief that after undergoing a LAA closure procedure, Todd [name] will not need long term anticoagulation which eliminates anticoagulation side effects and major bleeding risk.   Procedural risks for the Watchman implant have been reviewed with the patient including a 0.5% risk  of stroke, <1% risk of perforation and <1% risk of device embolization. Other risks include bleeding, vascular damage, tamponade, worsening renal function, and death. The patient understands these risk and wishes to proceed.     The published clinical data on the safety and effectiveness of WATCHMAN include but are not limited to the following: - Holmes DR, Jess BEARD, Sick P et al. for the PROTECT AF Investigators. Percutaneous closure of the left atrial appendage versus warfarin therapy for prevention of stroke in patients with atrial fibrillation: a randomised non-inferiority trial. Lancet 2009; 374: 534-42. GLENWOOD Jess BEARD, Doshi SK, Jonita VEAR Satchel D et al. on behalf of the PROTECT AF Investigators. Percutaneous Left Atrial Appendage Closure for Stroke Prophylaxis in Patients With Atrial Fibrillation 2.3-Year Follow-up of the PROTECT AF (Watchman Left Atrial Appendage System for Embolic Protection in Patients With Atrial Fibrillation) Trial. Circulation 2013; 127:720-729. - Alli O, Doshi S,  Kar S, Reddy VY, Sievert H et al. Quality of Life Assessment in the Randomized PROTECT AF (Percutaneous Closure of the Left Atrial Appendage Versus Warfarin Therapy for Prevention of Stroke in Patients With Atrial Fibrillation) Trial of Patients at Risk for Stroke With Nonvalvular Atrial Fibrillation. J Am Coll Cardiol 2013; 61:1790-8. GLENWOOD Satchel DR, Archer RAMAN, Price M, Whisenant B, Sievert H, Doshi S, Huber K, Reddy V. Prospective randomized evaluation of the Watchman left atrial appendage Device in patients with atrial fibrillation versus long-term warfarin therapy; the PREVAIL trial. Journal of the [employer] of Cardiology, Vol. 4, No. 1, 2014, 1-11. - Kar S, Doshi SK, Sadhu A, Horton R, Osorio J et al. Primary outcome evaluation of a next-generation left atrial appendage closure device: results from the PINNACLE FLX trial. Circulation 2021;143(18)1754-1762.    After today's visit with the patient which was  dedicated solely for shared decision making visit regarding LAA closure device, the patient decided to proceed with the LAA appendage closure procedure scheduled to be done in the near future at [facility]. Prior to the procedure, I would like to obtain a gated CT scan of the chest with contrast timed for PV/LA visualization.    HAS-BLED score 4 Hypertension Yes  Abnormal renal and liver function (Dialysis, transplant, Cr >2.26 mg/dL /Cirrhosis or Bilirubin >2x Normal or AST/ALT/AP >3x Normal) No  Stroke Yes  Bleeding Yes  Labile INR (Unstable/high INR) No  Elderly (>65) Yes  Drugs or alcohol (>= 8 drinks/week, anti-plt or NSAID) No   CHA2DS2-VASc Score = 5  The patient's score is based upon: CHF History: 0 HTN History: 1 Diabetes History: 1 Stroke History: 0 Vascular Disease History: 1 Age Score: 2 Gender Score: 0 1Watchman marketing campaign  Signed, Ole T. Cindie, MD, [facility], [facility] 09/26/2023 3:41 PM    Electrophysiology [facility]

## 2023-09-26 NOTE — Addendum Note (Signed)
 Addended by: CHAUVIGNE, Martha Ellerby on: 09/26/2023 03:49 PM   Modules accepted: Orders

## 2023-09-26 NOTE — Patient Instructions (Signed)
 Medication Instructions:  Your physician recommends that you continue on your current medications as directed. Please refer to the Current Medication list given to you today.  *If you need a refill on your cardiac medications before your next appointment, please call your pharmacy*  Testing/Procedures: Cardiac CT Your physician has requested that you have cardiac CT. Cardiac computed tomography (CT) is a painless test that uses an x-ray machine to take clear, detailed pictures of your heart. For further information please visit [email or web address] Please follow instruction sheet as given. We will call you to schedule your CT scan.   Watchman  Your physician has requested that you have Left atrial appendage (LAA) closure device implantation is a procedure to put a small device in the LAA of the heart. The LAA is a small sac in the wall of the heart's left upper chamber. Blood clots can form in this area. The device, Watchman closes the LAA to help prevent a blood clot and stroke.   Follow-Up: At [facility], you and your health needs are our priority.  As part of our continuing mission to provide you with exceptional heart care, we have created designated Provider Care Teams.  These Care Teams include your primary Cardiologist (physician) and Advanced Practice Providers (APPs -  Physician Assistants and Nurse Practitioners) who all work together to provide you with the care you need, when you need it.   Your next appointment:   You will be contacted by Nurse Navigator, Karsten Fells to schedule your pre-procedure visit and procedure date. If you have any questions she can be reached at [phone].

## 2023-09-27 MED ORDER — LANTUS SOLOSTAR 100 UNIT/ML ~~LOC~~ SOPN: 25.0000 [IU] | PEN_INJECTOR | Freq: Every day | SUBCUTANEOUS | Status: DC

## 2023-09-27 NOTE — Addendum Note (Signed)
 Addended by: Colene Dauphin on: 09/27/2023 04:35 PM   Modules accepted: Orders

## 2023-10-01 ENCOUNTER — Other Ambulatory Visit: Payer: Self-pay | Admitting: Cardiology

## 2023-10-02 ENCOUNTER — Encounter: Payer: Self-pay | Admitting: Neurology

## 2023-10-03 NOTE — Progress Notes (Unsigned)
Assessment/Plan:   Gait instability  -pt doesn't meet clinical criteria for Parkinsons Disease  -concerned about the profound proximal weakness in the legs  -do MRI lumbar given back pain and LE weakness  -do MRI brain with ataxia  -do MRI cervical due to neck pain  -MG labs due to diplopia  -daTscan if neg  Atrial fibrillation  -Currently on Xarelto.  Being evaluated for Watchman device  Jaw tremor  -can happen in pts of this age group but is more common in pts as poor dentition becomes an issue.  Will monitor.  Subjective:   Todd [name] was seen today in the movement disorders clinic for neurologic consultation at the request of Burns, [name], MD. Pt with daughter and wife who supplements hx.   The consultation is for the evaluation of possible Parkinson's.  Notes available to me are reviewed.  That a family friend who had symptoms that were similar to the patient's, including gait instability, fatigue, dysphagia, constipation and he had Parkinson's and family was concerned for similar.  He was subsequently referred for evaluation.   Specific Symptoms:  Tremor: Yes.  , "its not that bad; when I raise the hand it will sometimes shake."  Its both hands.  His daughter notes tremor around the mouth Family hx of similar:  Yes.   Voice: he's softer - he attributes to cough since Covid Sleep:   Vivid Dreams:  No.  Acting out dreams:  No. Wet Pillows: No. Postural symptoms:  Yes.   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  He did "slip" getting OOC the other day (wood floor and slipped trying to get up) but that "wasn't a fall" - that was a "slip" Bradykinesia symptoms: he reports no trouble out of car/couch but daughter states that it is difficult; constant sialorrhea;  Loss of smell:  Yes.   Loss of taste:  Yes.   Urinary Incontinence:  he has  undergarments for urinary urgency/frequency Difficulty Swallowing:  Yes.  , it can happen with solid/liquid/pills - awaiting repeat mbe in [place] Handwriting, micrographia: No. Trouble with ADL's:  No.  Trouble buttoning clothing: "they aint as anxious as it used to be"  Wife helps with his shoes Depression:  family says he is "agitated;"  he says "I have a great mood" Memory changes:  No. N/V:  No. Lightheaded:  No.  Syncope: No. Diplopia:  "kinda blurry and double"   Neuroimaging of the brain has not previously been performed.    ALLERGIES:   Allergies  Allergen Reactions   Amlodipine Besylate     Rash Because of a history of documented adverse serious drug reaction;Medi Alert bracelet  is recommended   Iodinated Contrast Media Rash    Rash  Because of a history of documented adverse serious drug reaction;Medi Alert bracelet  is recommended  Rash, Because of a history of documented adverse serious drug reaction;Medi Alert bracelet  is recommended   Metformin And Related Diarrhea    CURRENT MEDICATIONS:  Current Meds  Medication Sig   acetaminophen (TYLENOL) 650 MG CR tablet Take 650 mg by mouth every 8 (eight) hours as needed for pain.   atorvastatin (LIPITOR) 80 MG tablet Take 1 tablet (80 mg total) by mouth daily.   cholecalciferol (VITAMIN D3) 25 MCG (1000 UNIT) tablet Take 1,000  Units by mouth daily.   famotidine (PEPCID) 40 MG tablet Take 2 tablets (80 mg total) by mouth at bedtime.   furosemide (LASIX) 40 MG tablet Take 1 tablet (40 mg total) by mouth daily.   insulin glargine (LANTUS SOLOSTAR) 100 UNIT/ML Solostar Pen Inject 25 Units into the skin daily.   Insulin Pen Needle (PEN NEEDLES) 32G X 5 MM MISC UAD for daily lantus injection   ipratropium (ATROVENT) 0.06 % nasal spray Place 2-4 sprays into both nostrils every 8 (eight) hours as needed for rhinitis.   metFORMIN (GLUCOPHAGE-XR) 500 MG 24 hr tablet Take 1 tablet (500 mg total) by mouth 2 (two) times daily  with a meal.   metoprolol tartrate (LOPRESSOR) 100 MG tablet TAKE 1 TABLET BY MOUTH 2 TIMES DAILY.   Multiple Vitamin (MULTIVITAMIN PO) Take 1 tablet by mouth daily.   nitroGLYCERIN (NITROSTAT) 0.4 MG SL tablet Place 1 tablet (0.4 mg total) under the tongue every 5 (five) minutes as needed for chest pain.   Omega-3 Fatty Acids (FISH OIL PO) Take 1 tablet by mouth 2 (two) times daily.   omeprazole (PRILOSEC) 40 MG capsule TAKE 1 CAPSULE BY MOUTH 2 TIMES DAILY.   ONETOUCH DELICA LANCETS 33G MISC TEST BLOOD SUGAR ONCE DAILY   ONETOUCH VERIO test strip USE UP TO 4 TIMES A DAY AS DIRECTED   predniSONE (DELTASONE) 10 MG tablet TAKE 1 TABLET (10 MG TOTAL) BY MOUTH DAILY WITH BREAKFAST.   rivaroxaban (XARELTO) 20 MG TABS tablet TAKE 1 TABLET (20 MG TOTAL) BY MOUTH DAILY WITH SUPPER.     Objective:   VITALS:   Vitals:   10/05/23 0946  BP: 122/74  Pulse: 81  SpO2: 94%  Weight: 143 lb 9.6 oz (65.1 kg)  Height: 5\' 6"  (1.676 m)    GEN:  The patient appears stated age and is in NAD. HEENT:  Normocephalic, atraumatic.  The mucous membranes are moist. The superficial temporal arteries are without ropiness or tenderness. CV:  RRR Lungs:  CTAB Neck/HEME:  There are no carotid bruits bilaterally.  Neurological examination:  Orientation: The patient is alert and oriented x3.  Cranial nerves: There is good facial symmetry. Extraocular muscles are intact. The visual fields are full to confrontational testing. The speech is fluent and clear. Soft palate rises symmetrically and there is no tongue deviation. Hearing is decreased decreased to conversational tone. Sensation: Sensation is intact to light and pinprick throughout (facial, trunk, extremities). Vibration is intact at the bilateral big toe. There is no extinction with double simultaneous stimulation. There is no sensory dermatomal level identified. Motor: Strength is 5/5 in the bilateral upper extremities.  He has 3-/5 strength in the proximal LE  (right worse than L) and 5-/5 in the distal lower extremities.   Shoulder shrug is equal and symmetric.  There is no pronator drift. Deep tendon reflexes: Deep tendon reflexes are 2/4 at the bilateral biceps, triceps, brachioradialis, 2+ at the bilateral patella and achilles. Plantar responses are downgoing bilaterally.  Movement examination: Tone: There is nl tone in the bilateral upper extremities.  The tone in the lower extremities is nl.  Abnormal movements: there is jaw tremor Coordination:  There is no decremation with RAM's, with any form of RAMS, including alternating supination and pronation of the forearm, hand opening and closing, finger taps, heel taps and toe taps.  Gait and Station: The patient has difficulty arising without using the hands.  Pt pushes off.  Pt is initially a bit unstable but the  more he walks the less unstable/ataxic he becomes.  I have reviewed and interpreted the following labs independently   Chemistry      Component Value Date/Time   NA 137 09/19/2023 1154   NA 139 07/12/2023 1237   K 4.9 09/19/2023 1154   CL 102 09/19/2023 1154   CO2 26 09/19/2023 1154   BUN 28 (H) 09/19/2023 1154   BUN 23 07/12/2023 1237   CREATININE 0.81 09/19/2023 1154   CREATININE 0.93 05/07/2020 1022      Component Value Date/Time   CALCIUM 9.7 09/19/2023 1154   ALKPHOS 109 09/19/2023 1154   AST 28 09/19/2023 1154   ALT 33 09/19/2023 1154   BILITOT 0.7 09/19/2023 1154   BILITOT 0.7 03/16/2023 1004      Lab Results  Component Value Date   TSH 2.87 09/19/2023   Lab Results  Component Value Date   WBC 10.7 (H) 09/19/2023   HGB 14.3 09/19/2023   HCT 43.1 09/19/2023   MCV 103.9 (H) 09/19/2023   PLT 212.0 09/19/2023     Total time spent on today's visit was 60 minutes, including both face-to-face time and nonface-to-face time.  Time included that spent on review of records (prior notes available to me/labs/imaging if pertinent), discussing treatment and goals,  answering patient's questions and coordinating care.  Cc:  [name], MD

## 2023-10-05 ENCOUNTER — Encounter: Payer: Self-pay | Admitting: Neurology

## 2023-10-05 ENCOUNTER — Ambulatory Visit: Payer: PPO | Admitting: Neurology

## 2023-10-05 ENCOUNTER — Other Ambulatory Visit: Payer: PPO

## 2023-10-05 VITALS — BP 122/74 | HR 81 | Ht 66.0 in | Wt 143.6 lb

## 2023-10-05 DIAGNOSIS — M542 Cervicalgia: Secondary | ICD-10-CM | POA: Diagnosis not present

## 2023-10-05 DIAGNOSIS — M545 Low back pain, unspecified: Secondary | ICD-10-CM | POA: Diagnosis not present

## 2023-10-05 DIAGNOSIS — R29898 Other symptoms and signs involving the musculoskeletal system: Secondary | ICD-10-CM | POA: Diagnosis not present

## 2023-10-05 DIAGNOSIS — R270 Ataxia, unspecified: Secondary | ICD-10-CM

## 2023-10-05 DIAGNOSIS — H532 Diplopia: Secondary | ICD-10-CM

## 2023-10-05 DIAGNOSIS — S32030S Wedge compression fracture of third lumbar vertebra, sequela: Secondary | ICD-10-CM

## 2023-10-05 NOTE — Patient Instructions (Signed)
A referral to [facility] has been placed for your MRI someone will contact you directly to schedule your appt. They are located at [address] [facility]. Please contact them directly by calling [phone] with any questions regarding your referral.     Your provider has requested that you have labwork completed today. The lab is located on the Second floor at [address], within the [facility] office. When you get off the elevator, turn right and go in the [facility] [address]; the first brown door on the left.  Tell the ladies behind the desk that you are there for lab work. If you are not called within 15 minutes please check with the front desk.   Once you complete your labs you are free to go. You will receive a call or message via MyChart with your lab results.

## 2023-10-08 ENCOUNTER — Telehealth: Payer: Self-pay | Admitting: Pulmonary Disease

## 2023-10-08 DIAGNOSIS — J849 Interstitial pulmonary disease, unspecified: Secondary | ICD-10-CM

## 2023-10-08 NOTE — Telephone Encounter (Signed)
Patient needs a refill for Ofev. He has not received it in 2 months. Please call and advise.  Pharmacy: [facility]

## 2023-10-09 DIAGNOSIS — R053 Chronic cough: Secondary | ICD-10-CM | POA: Diagnosis not present

## 2023-10-09 DIAGNOSIS — J385 Laryngeal spasm: Secondary | ICD-10-CM | POA: Diagnosis not present

## 2023-10-11 ENCOUNTER — Ambulatory Visit: Payer: PPO | Admitting: Pulmonary Disease

## 2023-10-13 LAB — MYASTHENIA GRAVIS PANEL 2
A CHR BINDING ABS: <0.3 nmol/L
ACHR Blocking Abs: <15 %{inhibition} (ref <15)
Acetylchol Modul Ab: <1 %{inhibition}

## 2023-10-14 ENCOUNTER — Encounter: Payer: Self-pay | Admitting: Neurology

## 2023-10-15 ENCOUNTER — Ambulatory Visit: Payer: PPO | Admitting: Neurology

## 2023-10-15 ENCOUNTER — Encounter: Payer: Self-pay | Admitting: Neurology

## 2023-10-15 ENCOUNTER — Ambulatory Visit: Payer: Self-pay | Admitting: Internal Medicine

## 2023-10-15 NOTE — Telephone Encounter (Signed)
 Chief Complaint: Left elbow swelling/pain Symptoms: Redness, warm to the touch, pain (unable to determine, but says "probably a 10 because he doesn't like anyone to touch it." Frequency: 1 week onset of swelling, 3 days onset redness, warm to touch Pertinent Negatives: Patient denies fever, other symptoms Disposition: [] ED /[] Urgent Care (no appt availability in office) / [x] Appointment(In office/virtual)/ []  [facility]/ [] Home Care/ [] Refused Recommended Disposition /[] [facility]/ []  Follow-up with PCP Additional Notes: Patient's daughter Selena Batten, on [place], says she would prefer Thursday to be seen since he has to come to the hospital on Thursday for a MRI and it's difficult getting him up and walking to get in the car. Advised that is ok.

## 2023-10-15 NOTE — Telephone Encounter (Signed)
 Copied from CRM [id number]. Topic: Clinical - Red Word Triage >> Oct 15, 2023  9:36 AM Isabell A wrote: Red Word that prompted transfer to Nurse Triage: Selena Batten (daughter) states patients right elbow is swollen, red, hot & painful. Reason for Disposition  [1] Painful joint AND [2] no fever  Answer Assessment - Initial Assessment Questions 1. LOCATION: "Where is the swelling?" (e.g., left, right, both elbows)     Right 2. SIZE and DESCRIPTION: "What does the swelling look like?" (e.g., entire elbow, localized)     More like stretch the arm out, on the bottom half towards the hand 3. ONSET: "When did the swelling start?" "Does it come and go, or is it there all the time?"     Probably 1 week, now past 3 days ago gotten worse (warm to the touch) 4. SETTING: "Has there been any recent work, exercise or other activity that involved that part of the body?"      No 5. AGGRAVATING FACTORS: "What makes the elbow swelling worse?" (e.g., work, sports activities)     Movement makes the pain worse 6. ASSOCIATED SYMPTOMS: "Is there any pain or redness?"     Redness and pain 7. OTHER SYMPTOMS: "Do you have any other symptoms?" (e.g., fever)     No  Protocols used: Elbow Swelling-A-AH

## 2023-10-16 ENCOUNTER — Other Ambulatory Visit (HOSPITAL_COMMUNITY): Payer: Self-pay | Admitting: *Deleted

## 2023-10-16 MED ORDER — PREDNISONE 50 MG PO TABS: ORAL_TABLET | ORAL | 0 refills | Status: DC

## 2023-10-17 ENCOUNTER — Telehealth (HOSPITAL_COMMUNITY): Payer: Self-pay | Admitting: *Deleted

## 2023-10-17 NOTE — Telephone Encounter (Signed)
 Reaching out to patient to offer assistance regarding upcoming cardiac imaging study; spoke to daughter who verbalizes understanding of appt date/time, parking situation and where to check in, pre-test NPO status and medications ordered, and verified current allergies; name and call back number provided for further questions should they arise Johney Frame RN Navigator Cardiac Imaging [facility] [phone] office [phone] cell  Daughter verbalized understanding of allergy prep for CT scan.

## 2023-10-17 NOTE — Progress Notes (Signed)
 Subjective:    Patient ID: Todd [name], male    DOB: Jan 02, 1941, 83 y.o.   MRN: [id number]      HPI Ehan is here for  Chief Complaint  Patient presents with   Joint Swelling    Joint swelling in right elbow for the past week and a half. Pain to the touch. Patient's daughter has blood sugar log with her. Worsening gait. Patient is having varying levels of glucose. Currently treating with tylenol.     right elbow swelling -  started just over one week ago.  The area is warm, red and tender.    Diabetes - sugars all over the place.  Has been on high dose prednisone for Ct scan today  Eating small amount - 2 pkts of grits, two protein shakes a day, soup.  Still losing weight.  Has no appetite and gets full quicker.  Can not taste food.    Legs very swelling.  Persistent SOB  Legs weak - getting weaker.     Medications and allergies reviewed with patient and updated if appropriate.  Current Outpatient Medications on File Prior to Visit  Medication Sig Dispense Refill   acetaminophen (TYLENOL) 650 MG CR tablet Take 650 mg by mouth every 8 (eight) hours as needed for pain.     atorvastatin (LIPITOR) 80 MG tablet Take 1 tablet (80 mg total) by mouth daily. 90 tablet 3   cholecalciferol (VITAMIN D3) 25 MCG (1000 UNIT) tablet Take 1,000 Units by mouth daily.     famotidine (PEPCID) 40 MG tablet Take 2 tablets (80 mg total) by mouth at bedtime.     furosemide (LASIX) 40 MG tablet Take 1 tablet (40 mg total) by mouth daily.     insulin glargine (LANTUS SOLOSTAR) 100 UNIT/ML Solostar Pen Inject 25 Units into the skin daily.     Insulin Pen Needle (PEN NEEDLES) 32G X 5 MM MISC UAD for daily lantus injection 90 each 3   ipratropium (ATROVENT) 0.06 % nasal spray Place 2-4 sprays into both nostrils every 8 (eight) hours as needed for rhinitis.     metFORMIN (GLUCOPHAGE-XR) 500 MG 24 hr tablet Take 1 tablet (500 mg total) by mouth 2 (two) times daily with a meal. 60 tablet 5   metoprolol  tartrate (LOPRESSOR) 100 MG tablet TAKE 1 TABLET BY MOUTH 2 TIMES DAILY. 180 tablet 3   Multiple Vitamin (MULTIVITAMIN PO) Take 1 tablet by mouth daily.     nitroGLYCERIN (NITROSTAT) 0.4 MG SL tablet Place 1 tablet (0.4 mg total) under the tongue every 5 (five) minutes as needed for chest pain. 100 tablet 3   Omega-3 Fatty Acids (FISH OIL PO) Take 1 tablet by mouth 2 (two) times daily.     omeprazole (PRILOSEC) 40 MG capsule TAKE 1 CAPSULE BY MOUTH 2 TIMES DAILY. 120 capsule 5   ONETOUCH DELICA LANCETS 33G MISC TEST BLOOD SUGAR ONCE DAILY 100 each 3   ONETOUCH VERIO test strip USE UP TO 4 TIMES A DAY AS DIRECTED 100 strip 0   predniSONE (DELTASONE) 10 MG tablet TAKE 1 TABLET (10 MG TOTAL) BY MOUTH DAILY WITH BREAKFAST. 30 tablet 5   predniSONE (DELTASONE) 50 MG tablet Take tablet (50mg ) by mouth 13 hours, 7 hours, 1 hour prior to scan. 3 tablet 0   rivaroxaban (XARELTO) 20 MG TABS tablet TAKE 1 TABLET (20 MG TOTAL) BY MOUTH DAILY WITH SUPPER. 30 tablet 5   No current facility-administered medications on file prior to  visit.    Review of Systems  Constitutional:  Negative for fever.  Respiratory:  Positive for cough (improved with injections) and shortness of breath.   Cardiovascular:  Positive for leg swelling.  Gastrointestinal:  Positive for abdominal pain (sometimes - mild - peribumilical) and constipation.  Neurological:  Negative for headaches.       Objective:   Vitals:   10/18/23 0915  BP: 100/74  Pulse: (!) 110  Temp: 97.8 F (36.6 C)  SpO2: 98%   BP Readings from Last 3 Encounters:  10/18/23 100/74  10/05/23 122/74  09/26/23 126/76   Wt Readings from Last 3 Encounters:  10/18/23 138 lb (62.6 kg)  10/05/23 143 lb 9.6 oz (65.1 kg)  09/26/23 142 lb (64.4 kg)   Body mass index is 22.27 kg/m.    Physical Exam Constitutional:      General: He is not in acute distress.    Appearance: He is ill-appearing (chronically ill appearing).     Comments: Frail, elderly    HENT:     Head: Normocephalic and atraumatic.  Eyes:     Conjunctiva/sclera: Conjunctivae normal.  Cardiovascular:     Rate and Rhythm: Normal rate. Rhythm irregular.  Pulmonary:     Effort: Pulmonary effort is normal. No respiratory distress.     Breath sounds: No wheezing or rales.  Abdominal:     General: There is no distension.     Palpations: Abdomen is soft.     Tenderness: There is no abdominal tenderness.  Musculoskeletal:     Right lower leg: Edema (2+) present.     Left lower leg: Edema (2+) present.  Skin:    General: Skin is warm and dry.     Findings: Erythema (right distal posterior forearm/elbow - tender) present.  Neurological:     Mental Status: He is alert.  Psychiatric:        Mood and Affect: Mood normal.            Assessment & Plan:    See Problem List for Assessment and Plan of chronic medical problems.    Failure to thrive - over the past several months he has had increasing generalized weakness, weight loss for unknown reasons.  Labs have been ok.  Has multiple issues today with leg swelling, possible cellulitis of right elbow, uncontrolled DM, weight loss, decreased appetite.  Discussed with family and will have him go to the ED for evaluation

## 2023-10-18 ENCOUNTER — Ambulatory Visit (HOSPITAL_COMMUNITY): Admission: RE | Admit: 2023-10-18 | Payer: PPO | Source: Ambulatory Visit

## 2023-10-18 ENCOUNTER — Ambulatory Visit (INDEPENDENT_AMBULATORY_CARE_PROVIDER_SITE_OTHER): Payer: PPO | Admitting: Internal Medicine

## 2023-10-18 ENCOUNTER — Encounter: Payer: Self-pay | Admitting: Internal Medicine

## 2023-10-18 ENCOUNTER — Inpatient Hospital Stay (HOSPITAL_COMMUNITY)
Admission: EM | Admit: 2023-10-18 | Discharge: 2023-10-25 | DRG: 177 | Disposition: A | Discharge status: Home Health | Payer: PPO | Attending: Internal Medicine | Admitting: Internal Medicine

## 2023-10-18 ENCOUNTER — Emergency Department (HOSPITAL_COMMUNITY): Payer: PPO

## 2023-10-18 ENCOUNTER — Other Ambulatory Visit: Payer: Self-pay

## 2023-10-18 VITALS — BP 100/74 | HR 110 | Temp 97.8°F | Ht 66.0 in | Wt 138.0 lb

## 2023-10-18 DIAGNOSIS — R634 Abnormal weight loss: Secondary | ICD-10-CM

## 2023-10-18 DIAGNOSIS — E11649 Type 2 diabetes mellitus with hypoglycemia without coma: Secondary | ICD-10-CM | POA: Diagnosis not present

## 2023-10-18 DIAGNOSIS — Z91041 Radiographic dye allergy status: Secondary | ICD-10-CM

## 2023-10-18 DIAGNOSIS — Z951 Presence of aortocoronary bypass graft: Secondary | ICD-10-CM

## 2023-10-18 DIAGNOSIS — Z9842 Cataract extraction status, left eye: Secondary | ICD-10-CM

## 2023-10-18 DIAGNOSIS — T380X5A Adverse effect of glucocorticoids and synthetic analogues, initial encounter: Secondary | ICD-10-CM | POA: Diagnosis present

## 2023-10-18 DIAGNOSIS — Z825 Family history of asthma and other chronic lower respiratory diseases: Secondary | ICD-10-CM

## 2023-10-18 DIAGNOSIS — R29898 Other symptoms and signs involving the musculoskeletal system: Secondary | ICD-10-CM | POA: Diagnosis not present

## 2023-10-18 DIAGNOSIS — I2489 Other forms of acute ischemic heart disease: Secondary | ICD-10-CM | POA: Diagnosis not present

## 2023-10-18 DIAGNOSIS — R5383 Other fatigue: Secondary | ICD-10-CM

## 2023-10-18 DIAGNOSIS — I5023 Acute on chronic systolic (congestive) heart failure: Secondary | ICD-10-CM | POA: Diagnosis not present

## 2023-10-18 DIAGNOSIS — I509 Heart failure, unspecified: Principal | ICD-10-CM

## 2023-10-18 DIAGNOSIS — E1151 Type 2 diabetes mellitus with diabetic peripheral angiopathy without gangrene: Secondary | ICD-10-CM | POA: Diagnosis not present

## 2023-10-18 DIAGNOSIS — R531 Weakness: Secondary | ICD-10-CM | POA: Diagnosis not present

## 2023-10-18 DIAGNOSIS — Z8616 Personal history of COVID-19: Secondary | ICD-10-CM

## 2023-10-18 DIAGNOSIS — Z9841 Cataract extraction status, right eye: Secondary | ICD-10-CM

## 2023-10-18 DIAGNOSIS — I10 Essential (primary) hypertension: Secondary | ICD-10-CM

## 2023-10-18 DIAGNOSIS — I878 Other specified disorders of veins: Secondary | ICD-10-CM | POA: Diagnosis not present

## 2023-10-18 DIAGNOSIS — I110 Hypertensive heart disease with heart failure: Secondary | ICD-10-CM | POA: Diagnosis not present

## 2023-10-18 DIAGNOSIS — I4819 Other persistent atrial fibrillation: Secondary | ICD-10-CM | POA: Diagnosis not present

## 2023-10-18 DIAGNOSIS — J849 Interstitial pulmonary disease, unspecified: Secondary | ICD-10-CM | POA: Diagnosis not present

## 2023-10-18 DIAGNOSIS — Z8249 Family history of ischemic heart disease and other diseases of the circulatory system: Secondary | ICD-10-CM

## 2023-10-18 DIAGNOSIS — Z79899 Other long term (current) drug therapy: Secondary | ICD-10-CM

## 2023-10-18 DIAGNOSIS — I5022 Chronic systolic (congestive) heart failure: Secondary | ICD-10-CM

## 2023-10-18 DIAGNOSIS — I700 Atherosclerosis of aorta: Secondary | ICD-10-CM | POA: Diagnosis not present

## 2023-10-18 DIAGNOSIS — R1312 Dysphagia, oropharyngeal phase: Secondary | ICD-10-CM | POA: Diagnosis not present

## 2023-10-18 DIAGNOSIS — E876 Hypokalemia: Secondary | ICD-10-CM | POA: Diagnosis not present

## 2023-10-18 DIAGNOSIS — R600 Localized edema: Secondary | ICD-10-CM | POA: Diagnosis present

## 2023-10-18 DIAGNOSIS — I4891 Unspecified atrial fibrillation: Secondary | ICD-10-CM

## 2023-10-18 DIAGNOSIS — R059 Cough, unspecified: Secondary | ICD-10-CM | POA: Diagnosis not present

## 2023-10-18 DIAGNOSIS — J8410 Pulmonary fibrosis, unspecified: Secondary | ICD-10-CM | POA: Diagnosis present

## 2023-10-18 DIAGNOSIS — Z7901 Long term (current) use of anticoagulants: Secondary | ICD-10-CM | POA: Diagnosis not present

## 2023-10-18 DIAGNOSIS — I5021 Acute systolic (congestive) heart failure: Secondary | ICD-10-CM | POA: Diagnosis not present

## 2023-10-18 DIAGNOSIS — I252 Old myocardial infarction: Secondary | ICD-10-CM

## 2023-10-18 DIAGNOSIS — E785 Hyperlipidemia, unspecified: Secondary | ICD-10-CM | POA: Diagnosis not present

## 2023-10-18 DIAGNOSIS — J9601 Acute respiratory failure with hypoxia: Secondary | ICD-10-CM | POA: Diagnosis not present

## 2023-10-18 DIAGNOSIS — D649 Anemia, unspecified: Secondary | ICD-10-CM | POA: Diagnosis not present

## 2023-10-18 DIAGNOSIS — I2510 Atherosclerotic heart disease of native coronary artery without angina pectoris: Secondary | ICD-10-CM | POA: Diagnosis present

## 2023-10-18 DIAGNOSIS — Z833 Family history of diabetes mellitus: Secondary | ICD-10-CM

## 2023-10-18 DIAGNOSIS — U071 COVID-19: Secondary | ICD-10-CM | POA: Diagnosis not present

## 2023-10-18 DIAGNOSIS — R7989 Other specified abnormal findings of blood chemistry: Secondary | ICD-10-CM | POA: Diagnosis present

## 2023-10-18 DIAGNOSIS — E1165 Type 2 diabetes mellitus with hyperglycemia: Secondary | ICD-10-CM | POA: Diagnosis not present

## 2023-10-18 DIAGNOSIS — R739 Hyperglycemia, unspecified: Secondary | ICD-10-CM

## 2023-10-18 DIAGNOSIS — R0609 Other forms of dyspnea: Secondary | ICD-10-CM | POA: Diagnosis not present

## 2023-10-18 DIAGNOSIS — M7989 Other specified soft tissue disorders: Secondary | ICD-10-CM | POA: Diagnosis not present

## 2023-10-18 DIAGNOSIS — R0989 Other specified symptoms and signs involving the circulatory and respiratory systems: Secondary | ICD-10-CM | POA: Diagnosis present

## 2023-10-18 DIAGNOSIS — Z823 Family history of stroke: Secondary | ICD-10-CM

## 2023-10-18 DIAGNOSIS — Z8673 Personal history of transient ischemic attack (TIA), and cerebral infarction without residual deficits: Secondary | ICD-10-CM

## 2023-10-18 DIAGNOSIS — Z7984 Long term (current) use of oral hypoglycemic drugs: Secondary | ICD-10-CM

## 2023-10-18 DIAGNOSIS — Z77090 Contact with and (suspected) exposure to asbestos: Secondary | ICD-10-CM | POA: Diagnosis not present

## 2023-10-18 DIAGNOSIS — I480 Paroxysmal atrial fibrillation: Secondary | ICD-10-CM | POA: Diagnosis not present

## 2023-10-18 DIAGNOSIS — Z794 Long term (current) use of insulin: Secondary | ICD-10-CM | POA: Diagnosis not present

## 2023-10-18 DIAGNOSIS — Z7952 Long term (current) use of systemic steroids: Secondary | ICD-10-CM

## 2023-10-18 DIAGNOSIS — Z888 Allergy status to other drugs, medicaments and biological substances status: Secondary | ICD-10-CM

## 2023-10-18 LAB — CBC WITH DIFFERENTIAL/PLATELET
Abs Immature Granulocytes: 0 10*3/uL (ref 0.00–0.07)
Basophils Absolute: 0 10*3/uL (ref 0.0–0.1)
Basophils Relative: 0 %
Eosinophils Absolute: 0 10*3/uL (ref 0.0–0.5)
Eosinophils Relative: 0 %
HCT: 35.1 % — ABNORMAL LOW (ref 39.0–52.0)
Hemoglobin: 12 g/dL — ABNORMAL LOW (ref 13.0–17.0)
Lymphocytes Relative: 4 %
Lymphs Abs: 0.3 10*3/uL — ABNORMAL LOW (ref 0.7–4.0)
MCH: 34.2 pg — ABNORMAL HIGH (ref 26.0–34.0)
MCHC: 34.2 g/dL (ref 30.0–36.0)
MCV: 100 fL (ref 80.0–100.0)
Monocytes Absolute: 0.1 10*3/uL (ref 0.1–1.0)
Monocytes Relative: 2 %
Neutro Abs: 6.8 10*3/uL (ref 1.7–7.7)
Neutrophils Relative %: 94 %
Platelets: 268 10*3/uL (ref 150–400)
RBC: 3.51 MIL/uL — ABNORMAL LOW (ref 4.22–5.81)
RDW: 16.1 % — ABNORMAL HIGH (ref 11.5–15.5)
WBC: 7.2 10*3/uL (ref 4.0–10.5)
nRBC: 0 /100{WBCs}
nRBC: 0.3 % — ABNORMAL HIGH (ref 0.0–0.2)

## 2023-10-18 LAB — CBG MONITORING, ED
Glucose-Capillary: 361 mg/dL — ABNORMAL HIGH (ref 70–99)
Glucose-Capillary: 450 mg/dL — ABNORMAL HIGH (ref 70–99)
Glucose-Capillary: 501 mg/dL (ref 70–99)
Glucose-Capillary: 537 mg/dL (ref 70–99)
Glucose-Capillary: >600 mg/dL (ref 70–99)

## 2023-10-18 LAB — URINALYSIS, ROUTINE W REFLEX MICROSCOPIC
Bilirubin Urine: NEGATIVE
Glucose, UA: >=500 mg/dL — AB
Ketones, ur: NEGATIVE mg/dL
Leukocytes,Ua: NEGATIVE
Nitrite: NEGATIVE
Protein, ur: NEGATIVE mg/dL
Specific Gravity, Urine: 1.009 (ref 1.005–1.030)
pH: 5 (ref 5.0–8.0)

## 2023-10-18 LAB — COMPREHENSIVE METABOLIC PANEL
ALT: 51 U/L — ABNORMAL HIGH (ref 0–44)
AST: 38 U/L (ref 15–41)
Albumin: 2.3 g/dL — ABNORMAL LOW (ref 3.5–5.0)
Alkaline Phosphatase: 87 U/L (ref 38–126)
Anion gap: 8 (ref 5–15)
BUN: 34 mg/dL — ABNORMAL HIGH (ref 8–23)
CO2: 29 mmol/L (ref 22–32)
Calcium: 8.9 mg/dL (ref 8.9–10.3)
Chloride: 96 mmol/L — ABNORMAL LOW (ref 98–111)
Creatinine, Ser: 0.98 mg/dL (ref 0.61–1.24)
GFR, Estimated: >60 mL/min (ref >60)
Glucose, Bld: 507 mg/dL (ref 70–99)
Potassium: 4.4 mmol/L (ref 3.5–5.1)
Sodium: 133 mmol/L — ABNORMAL LOW (ref 135–145)
Total Bilirubin: 0.8 mg/dL (ref 0.0–1.2)
Total Protein: 6.6 g/dL (ref 6.5–8.1)

## 2023-10-18 LAB — RESP PANEL BY RT-PCR (RSV, FLU A&B, COVID)  RVPGX2
Influenza A by PCR: NEGATIVE
Influenza B by PCR: NEGATIVE
Resp Syncytial Virus by PCR: NEGATIVE
SARS Coronavirus 2 by RT PCR: POSITIVE — AB

## 2023-10-18 LAB — TROPONIN I (HIGH SENSITIVITY)
Troponin I (High Sensitivity): 62 ng/L — ABNORMAL HIGH (ref <18)
Troponin I (High Sensitivity): 57 ng/L — ABNORMAL HIGH (ref <18)

## 2023-10-18 LAB — BRAIN NATRIURETIC PEPTIDE: B Natriuretic Peptide: 738 pg/mL — ABNORMAL HIGH (ref 0.0–100.0)

## 2023-10-18 MED ORDER — FUROSEMIDE 10 MG/ML IJ SOLN
40.0000 mg | Freq: Every day | INTRAMUSCULAR | Status: DC
  Administered 2023-10-19: 40 mg via INTRAVENOUS
  Filled 2023-10-18: qty 4

## 2023-10-18 MED ORDER — INSULIN ASPART 100 UNIT/ML IJ SOLN
0.0000 [IU] | INTRAMUSCULAR | Status: DC
  Administered 2023-10-19: 5 [IU] via SUBCUTANEOUS

## 2023-10-18 MED ORDER — INSULIN ASPART 100 UNIT/ML IJ SOLN
10.0000 [IU] | Freq: Once | INTRAMUSCULAR | Status: AC
  Administered 2023-10-18: 10 [IU] via SUBCUTANEOUS

## 2023-10-18 MED ORDER — FUROSEMIDE 10 MG/ML IJ SOLN
40.0000 mg | Freq: Once | INTRAMUSCULAR | Status: AC
  Administered 2023-10-18: 40 mg via INTRAVENOUS
  Filled 2023-10-18: qty 4

## 2023-10-18 MED ORDER — METOPROLOL TARTRATE 25 MG PO TABS: 25.0000 mg | ORAL_TABLET | Freq: Four times a day (QID) | ORAL | Status: DC

## 2023-10-18 MED ORDER — SODIUM CHLORIDE 0.9 % IV BOLUS
500.0000 mL | Freq: Once | INTRAVENOUS | Status: AC
  Administered 2023-10-18: 500 mL via INTRAVENOUS

## 2023-10-18 NOTE — ED Triage Notes (Signed)
 Daughter stated, Todd [name] had SOB. , weakness, decrease appetite, weight loss, rt. Elbow infection . Started about a month ago.

## 2023-10-18 NOTE — Assessment & Plan Note (Signed)
 Pt had a choking episode in ER Now back to baseline  SLP consult

## 2023-10-18 NOTE — ED Notes (Signed)
 Cbg 450. Dr Adela Glimpse notified and order for 10units insulin subcutaneous to be given now as extra dose at this time.

## 2023-10-18 NOTE — Assessment & Plan Note (Signed)
 Continue Xarelto Given soft blood pressures hold metoprolol for tonight Repeat echo Patient is being worked up for possible watchman's procedure If heart rate continues to be difficult to control would recommend a.m. consult to cardiology

## 2023-10-18 NOTE — Subjective & Objective (Signed)
 Patient with shortness of breath generalized fatigue decreased appetite and weight loss also was recently told has right elbow infection about a month ago While in the ER had an episode of coughing and vomiting became hypoxic down to 80s start on 2 L per family has been some questions about dysphagia  Patient has bilateral lower extremity weakness since 2020 but progressively getting worse decreased ability to perform daily living activity no recent fever or chest pain  Regarding right elbow he states that he had not heard a pop  Chronic history of pulmonary fibrosis for which she is on prednisone  Has had worsening lower extremity swelling went to primary care office who sent him to emergency department

## 2023-10-18 NOTE — Assessment & Plan Note (Signed)
 Patient tested positive for COVID He is already on steroids in preparation for his CT chest Currently denies any new cough he has chronic cough ever since his prior episode of COVID 3 years ago Chest x-ray nonacute Continue to monitor supportive management

## 2023-10-18 NOTE — ED Notes (Signed)
 Family stated that he had a swallow screen scheduled sometime in April and has requested to have one done here during admission. RN christopher notified.

## 2023-10-18 NOTE — ED Notes (Signed)
 Report given to Zadarius RN at this time

## 2023-10-18 NOTE — Assessment & Plan Note (Signed)
-   Order Sensitive   SSI   - continue home insulin  25 units,  -  check TSH and HgA1C  - Hold by mouth medications

## 2023-10-18 NOTE — ED Notes (Signed)
 Pt was eating dinner and began coughing and vomiting, after sitting patient up and encouraging pt to cough further, he was able to catch his breath. O2 sats were in the high 80%, low 90%. I informed christopher RN about situation, and applied a nasal cannula, 2L. O2 sats at 99% now and patient is feeling better now. Fall risk bracelet placed on pt.

## 2023-10-18 NOTE — Patient Instructions (Addendum)
    Significant worsening generalized weakness, decreased appetite, weight loss, FTT  Infection right elbow, sacral ulcer stage 2 not infected  Periumbilical pain intermittent  Increased leg swelling, acute and chronic shortness of breath, Afib  Diabetic with hyperglycemia    Pre-medicated for CT with contrast today - ideally should get imaging asap since pre-medicated   -- in my opinion not strong enough for watchman device

## 2023-10-18 NOTE — Assessment & Plan Note (Signed)
 Troponin elevated but coming down,no CP   echo in AM

## 2023-10-18 NOTE — H&P (Addendum)
 Todd [name]:[id number] DOB: [birth year] DOA: 10/18/2023     PCP: [name], MD   Outpatient Specialists:  CARDS:   Dr. Rollene Rotunda, MD    NEurology   Dr. [name] [facility]monary    GI  Dr.  Shearon Stalls) Iva Boop, MD  Patient arrived to ER on 10/18/23 at 1043 Referred by Attending Rondel Baton, MD   Patient coming from:    Home With family    Chief Complaint:   Chief Complaint  Patient presents with   Weakness   decrease appetite   Weight Loss   Shortness of Breath   Skin Ulcer    HPI: Todd [name] is a 83 y.o. male with medical history significant of A.fib on Xarelto, DM2, pulmonary fibrosis, HTN, HLD, DM2, CAD sp CABG,     Presented with   progressive weakness Patient with shortness of breath generalized fatigue decreased appetite and weight loss also was recently told has right elbow infection about a month ago While in the ER had an episode of coughing and vomiting became hypoxic down to 80s start on 2 L per family has been some questions about dysphagia  Patient has bilateral lower extremity weakness since 2020 but progressively getting worse decreased ability to perform daily living activity no recent fever or chest pain  Regarding right elbow he states that he had not heard a pop  Chronic history of pulmonary fibrosis for which she is on prednisone  Has had worsening lower extremity swelling went to primary care office who sent him to emergency department     Denies significant ETOH intake  Does not smoke  Lab Results  Component Value Date   SARSCOV2NAA NEGATIVE 03/29/2019        Regarding pertinent Chronic problems:   Hyperlipidemia - on statins Lipitor (atorvastatin)  Lipid Panel     Component Value Date/Time   CHOL 126 09/19/2023 1154   CHOL 131 03/16/2023 1004   TRIG 183.0 (H) 09/19/2023 1154   TRIG 94 07/19/2006 0727   HDL 38.20 (L) 09/19/2023 1154   HDL 36 (L) 03/16/2023 1004   CHOLHDL 3 09/19/2023 1154   VLDL 36.6 09/19/2023  1154   LDLCALC 51 09/19/2023 1154   LDLCALC 69 03/16/2023 1004   LABVLDL 26 03/16/2023 1004    HTN on toprol   last echo  Recent Results (from the past 27253 hours)  ECHOCARDIOGRAM COMPLETE   Collection Time: 01/08/23  9:23 AM  Result Value   Weight 2,544   Height 68   BP 105/78   S' Lateral 2.50   AR max vel 1.80   AV Peak grad 6.8   Ao pk vel 1.30   Area-P 1/2 5.13   Est EF 55 - 60%   Narrative      ECHOCARDIOGRAM REPORT        IMPRESSIONS    1. Left ventricular ejection fraction, by estimation, is 55 to 60%. The left ventricle has normal function. The left ventricle has no regional wall motion abnormalities. There is mild concentric left ventricular hypertrophy. Left ventricular diastolic  function could not be evaluated.  2. Right ventricular systolic function is normal. The right ventricular size is normal.  3. The mitral valve is grossly normal. Trivial mitral valve regurgitation. No evidence of mitral stenosis. Moderate mitral annular calcification.  4. The aortic valve is grossly normal. There is mild calcification of the aortic valve. Aortic valve regurgitation is not visualized. Aortic valve sclerosis is present,  with no evidence of aortic valve stenosis.  Comparison(s): Prior images reviewed side by side. Changes from prior study are noted. In atrial flutter throughout the study.  Conclusion(s)/Recommendation(s): Otherwise normal echocardiogram, with minor abnormalities described in the report. Atrial flutter noted.            CAD  - On Aspirin, statin, betablocker, Plavix                 - *followed by cardiology                - last cardiac cath        DM 2 -  Lab Results  Component Value Date   HGBA1C 11.0 (H) 09/19/2023  on insulin  A. Fib -   atrial fibrillation CHA2DS2 vas score   7     current  on anticoagulation with  Xarelto           -  Rate control:  Currently controlled with Toprolol    While in ER: Clinical Course as of 10/18/23  1926  Thu Oct 18, 2023  1631 B Natriuretic Peptide(!): 738.0 [LS]    Clinical Course User Index [LS] Meade Maw, Student-PA     Lab Orders         Urinalysis, Routine w reflex microscopic -Urine, Random         Comprehensive metabolic panel         CBC with Differential         Brain natriuretic peptide         CBG monitoring, ED         CBG monitoring, ED         CBG monitoring, ED      Right elbow - Soft tissue swelling of the posterior elbow and proximal forearm. No acute osseous abnormality.   CXR - Chronic interstitial lung disease without superimposed airspace opacity.   Following Medications were ordered in ER: Medications  sodium chloride 0.9 % bolus 500 mL (500 mLs Intravenous New Bag/Given 10/18/23 1826)  insulin aspart (novoLOG) injection 10 Units (10 Units Subcutaneous Given 10/18/23 1826)  furosemide (LASIX) injection 40 mg (40 mg Intravenous Given 10/18/23 1820)       ED Triage Vitals  Encounter Vitals Group     BP 10/18/23 1108 109/70     Systolic BP Percentile --      Diastolic BP Percentile --      Pulse Rate 10/18/23 1108 (!) 58     Resp 10/18/23 1108 18     Temp 10/18/23 1108 97.9 F (36.6 C)     Temp Source 10/18/23 1648 Oral     SpO2 10/18/23 1108 98 %     Weight --      Height --      Head Circumference --      Peak Flow --      Pain Score 10/18/23 1113 4     Pain Loc --      Pain Education --      Exclude from Growth Chart --   JYNW(29)@     _________________________________________ Significant initial  Findings: Abnormal Labs Reviewed  COMPREHENSIVE METABOLIC PANEL - Abnormal; Notable for the following components:      Result Value   Sodium 133 (*)    Chloride 96 (*)    Glucose, Bld 507 (*)    BUN 34 (*)    Albumin 2.3 (*)    ALT 51 (*)    All other  components within normal limits  CBC WITH DIFFERENTIAL/PLATELET - Abnormal; Notable for the following components:   RBC 3.51 (*)    Hemoglobin 12.0 (*)    HCT 35.1 (*)    MCH 34.2  (*)    RDW 16.1 (*)    nRBC 0.3 (*)    Lymphs Abs 0.3 (*)    All other components within normal limits  BRAIN NATRIURETIC PEPTIDE - Abnormal; Notable for the following components:   B Natriuretic Peptide 738.0 (*)    All other components within normal limits  CBG MONITORING, ED - Abnormal; Notable for the following components:   Glucose-Capillary >600 (*)    All other components within normal limits  CBG MONITORING, ED - Abnormal; Notable for the following components:   Glucose-Capillary 537 (*)    All other components within normal limits  CBG MONITORING, ED - Abnormal; Notable for the following components:   Glucose-Capillary 501 (*)    All other components within normal limits  TROPONIN I (HIGH SENSITIVITY) - Abnormal; Notable for the following components:   Troponin I (High Sensitivity) 62 (*)    All other components within normal limits      _________________________ Troponin  ordered Cardiac Panel (last 3 results) Recent Labs    10/18/23 1141 10/18/23 1820  TROPONINIHS 62* 57*   ECG: Ordered Personally reviewed and interpreted by me showing: HR : 118 Rhythm: Atrial fibrillation with rapid ventricular response Anterior infarct , age undetermined ST & T wave abnormality, consider lateral ischemia Abnormal ECG QTC 484  BNP (last 3 results) Recent Labs    01/07/23 2145 10/18/23 1135  BNP 790.3* 738.0*     COVID-19 Labs  No results for input(s): "DDIMER", "FERRITIN", "LDH", "CRP" in the last 72 hours.  Lab Results  Component Value Date   SARSCOV2NAA NEGATIVE 03/29/2019   _  The recent clinical data is shown below. Vitals:   10/18/23 1648 10/18/23 1739 10/18/23 1809 10/18/23 1812  BP: (!) 129/97     Pulse: (!) 104     Resp: (!) 24     Temp: 97.7 F (36.5 C)     TempSrc: Oral     SpO2: 94% 96% 92% 96%    WBC     Component Value Date/Time   WBC 7.2 10/18/2023 1141   LYMPHSABS 0.3 (L) 10/18/2023 1141   MONOABS 0.1 10/18/2023 1141   EOSABS 0.0  10/18/2023 1141   BASOSABS 0.0 10/18/2023 1141    Procalcitonin   Ordered       __________ _______________________________________________ Recent Labs  Lab 10/18/23 1141  NA 133*  K 4.4  CO2 29  GLUCOSE 507*  BUN 34*  CREATININE 0.98  CALCIUM 8.9    Cr    stable,   Lab Results  Component Value Date   CREATININE 0.98 10/18/2023   CREATININE 0.81 09/19/2023   CREATININE 0.92 07/12/2023    Recent Labs  Lab 10/18/23 1141  AST 38  ALT 51*  ALKPHOS 87  BILITOT 0.8  PROT 6.6  ALBUMIN 2.3*   Lab Results  Component Value Date   CALCIUM 8.9 10/18/2023   PHOS 1.9 (L) 11/03/2013        Plt: Lab Results  Component Value Date   PLT 268 10/18/2023    Recent Labs  Lab 10/18/23 1141  WBC 7.2  NEUTROABS 6.8  HGB 12.0*  HCT 35.1*  MCV 100.0  PLT 268    HG/HCT  stable,      Component Value Date/Time  HGB 12.0 (L) 10/18/2023 1141   HCT 35.1 (L) 10/18/2023 1141   MCV 100.0 10/18/2023 1141    _______________________________________________ Hospitalist was called for admission for acute respiratory failure, fluid overload vs aspiration   The following Work up has been ordered so far:  Orders Placed This Encounter  Procedures   DG Chest 2 View   DG Elbow Complete Right   Urinalysis, Routine w reflex microscopic -Urine, Random   Comprehensive metabolic panel   CBC with Differential   Brain natriuretic peptide   Diet Carb Modified Fluid consistency: Thin; Room service appropriate? Yes   Document Height and Actual Weight   ED Cardiac monitoring   Swallow screen   Consult to hospitalist   CBG monitoring, ED   CBG monitoring, ED   CBG monitoring, ED   EKG 12-Lead   ED EKG   Saline lock IV     OTHER Significant initial  Findings:  labs showing:     DM  labs:  HbA1C: Recent Labs    12/18/22 1602 06/06/23 0856 09/19/23 1154  HGBA1C 8.1* 8.9* 11.0*      CBG (last 3)  Recent Labs    10/18/23 1345 10/18/23 1742 10/18/23 1853  GLUCAP >600*  537* 501*          Cultures: No results found for: "SDES", "SPECREQUEST", "CULT", "REPTSTATUS"   Radiological Exams on Admission: DG Chest 2 View Result Date: 10/18/2023 CLINICAL DATA:  [id number] Cough in adult [id number] EXAM: CHEST - 2 VIEW COMPARISON:  06/06/2023 FINDINGS: Heart size that is post sternotomy and CABG. Aortic atherosclerosis. Extensive coarsened interstitial markings compatible with none interstitial lung disease. No definite superimposed airspace opacity. No pleural effusion or pneumothorax. IMPRESSION: Chronic interstitial lung disease without superimposed airspace opacity. Electronically Signed   By: Duanne Guess D.O.   On: 10/18/2023 15:42   DG Elbow Complete Right Result Date: 10/18/2023 CLINICAL DATA:  Skin infection EXAM: RIGHT ELBOW - COMPLETE 3+ VIEW COMPARISON:  None Available. FINDINGS: There is no evidence of fracture, dislocation, or joint effusion. No erosion or periosteal elevation. There is no evidence of arthropathy or other focal bone abnormality. Soft tissue swelling of the posterior elbow and proximal forearm. No soft tissue air. IMPRESSION: Soft tissue swelling of the posterior elbow and proximal forearm. No acute osseous abnormality. Electronically Signed   By: Duanne Guess D.O.   On: 10/18/2023 15:41   _______________________________________________________________________________________________________ Latest  Blood pressure (!) 129/97, pulse (!) 104, temperature 97.7 F (36.5 C), temperature source Oral, resp. rate (!) 24, SpO2 96%.   Vitals  labs and radiology finding personally reviewed  Review of Systems:  Bilateral lower extremity swelling   Pertinent positives include:  fatigue,  Constitutional:  No weight loss, night sweats, Fevers, chills,  weight loss  HEENT:  No headaches, Difficulty swallowing,Tooth/dental problems,Sore throat,  No sneezing, itching, ear ache, nasal congestion, post nasal drip,  Cardio-vascular:  No chest pain,  Orthopnea, PND, anasarca, dizziness, palpitations.no  GI:  No heartburn, indigestion, abdominal pain, nausea, vomiting, diarrhea, change in bowel habits, loss of appetite, melena, blood in stool, hematemesis Resp:  no shortness of breath at rest. No dyspnea on exertion, No excess mucus, no productive cough, No non-productive cough, No coughing up of blood.No change in color of mucus.No wheezing. Skin:  no rash or lesions. No jaundice GU:  no dysuria, change in color of urine, no urgency or frequency. No straining to urinate.  No flank pain.  Musculoskeletal:  No joint pain or no joint swelling.  No decreased range of motion. No back pain.  Psych:  No change in mood or affect. No depression or anxiety. No memory loss.  Neuro: no localizing neurological complaints, no tingling, no weakness, no double vision, no gait abnormality, no slurred speech, no confusion  All systems reviewed and apart from HOPI all are negative _______________________________________________________________________________________________ Past Medical History:   Past Medical History:  Diagnosis Date   Allergic rhinitis    Asthma    Atrial fibrillation with rapid ventricular response (HCC)    a. newly diagnosed 11/03/13, spont conv to NSR, placed on xarelto.   CAD (coronary artery disease)    a. 1999; s/p CABG: LIMA to LAD, SVG to OM1, SVG to OM2, SVG to RCA  b. Normal nuc 10/2013 (done because of new onset AF.)   COVID    Diabetes mellitus (HCC)    HLD (hyperlipidemia)    HTN (hypertension)    Nephrolithiasis    Overweight(278.02)    PVD (peripheral vascular disease) (HCC)    Carotid stenosis   Stroke [facility])       Past Surgical History:  Procedure Laterality Date   CAROTID ENDARTERECTOMY  1999   COLONOSCOPY  2014   negative;Dr Jarold Motto   COLONOSCOPY W/ POLYPECTOMY  2009   Dr Jarold Motto   CORONARY ARTERY BYPASS GRAFT  Aug.1999   HEMORRHOID BANDING     INTRACAPSULAR CATARACT EXTRACTION Bilateral 2018    LEFT HEART CATH AND CORS/GRAFTS ANGIOGRAPHY N/A 01/08/2023   Procedure: LEFT HEART CATH AND CORS/GRAFTS ANGIOGRAPHY;  Surgeon: [place], Peter M, MD;  Location: [facility] INVASIVE CV LAB;  Service: Cardiovascular;  Laterality: N/A;   NASAL SINUS SURGERY      Social History:  Ambulatory  walker     reports that he has never smoked. He has never used smokeless tobacco. He reports that he does not drink alcohol and does not use drugs.   Family History:   Family History  Problem Relation Age of Onset   Heart attack Mother 40   Diabetes Father        IDDM   Stroke Father 55   Asthma Sister    Lupus Sister    Fibromyalgia Sister    Fibromyalgia Sister    Prostate cancer Brother    Hemochromatosis Brother    Colon cancer Brother 72   Heart attack Brother 40   Heart attack Brother 43   Esophageal cancer Neg Hx    Rectal cancer Neg Hx    Stomach cancer Neg Hx    ______________________________________________________________________________________________ Allergies: Allergies  Allergen Reactions   Amlodipine Besylate     Rash Because of a history of documented adverse serious drug reaction;Medi Alert bracelet  is recommended   Iodinated Contrast Media Rash    Rash  Because of a history of documented adverse serious drug reaction;Medi Alert bracelet  is recommended  Rash, Because of a history of documented adverse serious drug reaction;Medi Alert bracelet  is recommended   Metformin And Related Diarrhea    Prior to Admission medications   Medication Sig Start Date End Date Taking? Authorizing Provider  acetaminophen (TYLENOL) 650 MG CR tablet Take 650 mg by mouth every 8 (eight) hours as needed for pain.   Yes [provider]  atorvastatin (LIPITOR) 80 MG tablet Take 1 tablet (80 mg total) by mouth daily. Patient taking differently: Take 80 mg by mouth at bedtime. 01/10/23  Yes Hermelinda Dellen, MD  cholecalciferol (VITAMIN D3) 25 MCG (1000 UNIT) tablet Take 1,000 Units by  mouth daily.   Yes [provider]  Cyanocobalamin (VITAMIN B-12 PO) Take 1 tablet by mouth daily.   Yes [provider]  famotidine (PEPCID) 40 MG tablet Take 2 tablets (80 mg total) by mouth at bedtime. 01/09/23  Yes Hermelinda Dellen, MD  furosemide (LASIX) 40 MG tablet Take 1 tablet (40 mg total) by mouth daily. 09/19/23  Yes Burns, [name], MD  insulin glargine (LANTUS SOLOSTAR) 100 UNIT/ML Solostar Pen Inject 25 Units into the skin daily. Patient taking differently: Inject 25 Units into the skin in the morning. 09/27/23  Yes Burns, [name], MD  ipratropium (ATROVENT) 0.06 % nasal spray Place 2-4 sprays into both nostrils every 8 (eight) hours as needed for rhinitis. 12/04/22  Yes [provider]  metFORMIN (GLUCOPHAGE-XR) 500 MG 24 hr tablet Take 1 tablet (500 mg total) by mouth 2 (two) times daily with a meal. 06/13/23  Yes Burns, [name], MD  metoprolol tartrate (LOPRESSOR) 100 MG tablet TAKE 1 TABLET BY MOUTH 2 TIMES DAILY. 10/02/23  Yes Rollene Rotunda, MD  Multiple Vitamin (MULTIVITAMIN PO) Take 1 tablet by mouth daily.   Yes [provider]  nitroGLYCERIN (NITROSTAT) 0.4 MG SL tablet Place 1 tablet (0.4 mg total) under the tongue every 5 (five) minutes as needed for chest pain. 08/25/21 08/10/07 Yes Rollene Rotunda, MD  Omega-3 Fatty Acids (FISH OIL PO) Take 1 tablet by mouth 2 (two) times daily.   Yes [provider]  omeprazole (PRILOSEC) 40 MG capsule TAKE 1 CAPSULE BY MOUTH 2 TIMES DAILY. 05/18/23  Yes Burns, [name], MD  predniSONE (DELTASONE) 10 MG tablet TAKE 1 TABLET (10 MG TOTAL) BY MOUTH DAILY WITH BREAKFAST. 05/05/23  Yes Mannam, Praveen, MD  predniSONE (DELTASONE) 50 MG tablet Take tablet (50mg ) by mouth 13 hours, 7 hours, 1 hour prior to scan. 10/16/23  Yes Lanier Prude, MD  rivaroxaban (XARELTO) 20 MG TABS tablet TAKE 1 TABLET (20 MG TOTAL) BY MOUTH DAILY WITH SUPPER. 09/20/23  Yes Rollene Rotunda, MD  Insulin Pen Needle (PEN NEEDLES)  32G X 5 MM MISC UAD for daily lantus injection 09/20/23   Burns, [name], MD  [facility] DELICA LANCETS 33G MISC TEST BLOOD SUGAR ONCE DAILY 01/23/18   [name], MD  [facility] VERIO test strip USE UP TO 4 TIMES A DAY AS DIRECTED 07/06/21   [name], MD    ___________________________________________________________________________________________________ Physical Exam:    10/18/2023    4:48 PM 10/18/2023    3:02 PM 10/18/2023   11:08 AM  Vitals with BMI  Systolic 129 125 045  Diastolic 97 75 70  Pulse 104 61 58     1. General:  in No  Acute distress    Chronically ill -appearing 2. Psychological: Alert and  Oriented 3. Head/ENT:    Dry Mucous Membranes                          Head Non traumatic, neck supple                          Poor Dentition 4. SKIN: normal   Skin turgor,  Skin clean Dry and intact no rash    5. Heart: Regular rate and rhythm no  Murmur, no Rub or gallop 6. Lungs:  no wheezes or crackles   7. Abdomen: Soft,  non-tender, Non distended  bowel sounds present 8. Lower extremities: no  clubbing, cyanosis, 2+ edema 9. Neurologically Grossly intact, moving all 4 extremities equally   10. MSK: Normal range of motion    Chart has been reviewed  ______________________________________________________________________________________________  Assessment/Plan 83 y.o. male with medical history significant of A.fib on Xarelto, DM2, pulmonary fibrosis, HTN, HLD, DM2, CAD sp CABG,   Admitted for  fluid overload and choking episode   Present on Admission:  Acute respiratory failure with hypoxia (HCC)  Type 2 diabetes, controlled, with peripheral circulatory disorder (HCC)  Elevated troponin  Leg edema  Choking episode  Paroxysmal atrial fibrillation (HCC)     Type 2 diabetes, controlled, with peripheral circulatory disorder (HCC)  - Order Sensitive   SSI   - continue home insulin  25 units,  -  check TSH and HgA1C  - Hold by mouth medications     Elevated troponin Troponin elevated but coming down,no CP   echo in AM  Leg edema In the setting of recent high prednisone dose Hx of chronic leg swelling Last echo with preserved EF  Choking episode Pt had a choking episode in ER Now back to baseline  SLP consult  Paroxysmal atrial fibrillation (HCC) Continue Xarelto Given soft blood pressures hold metoprolol for tonight Repeat echo Patient is being worked up for possible watchman's procedure If heart rate continues to be difficult to control would recommend a.m. consult to cardiology  COVID-19 virus infection Patient tested positive for COVID He is already on steroids in preparation for his CT chest Currently denies any new cough he has chronic cough ever since his prior episode of COVID 3 years ago Chest x-ray nonacute Continue to monitor supportive management   Other plan as per orders.  DVT prophylaxis:  xarelto    Code Status:    Code Status: Prior FULL CODE   as per patient   I had personally discussed CODE STATUS with patient   ACP   none   Family Communication:   Family not at  Bedside    Diet  Diet Orders (From admission, onward)     Start     Ordered   10/18/23 1658  Diet Carb Modified Fluid consistency: Thin; Room service appropriate? Yes  Diet effective now       Question Answer Comment  Diet-HS Snack? Nothing   Calorie Level Medium 1600-2000   Fluid consistency: Thin   Room service appropriate? Yes      10/18/23 1657            Disposition Plan:        To home once workup is complete and patient is stable   Following barriers for discharge:                                                        Electrolytes corrected                               Anemia   stable                                                         Afebrile,  white count improving able to transition to PO antibiotics                             Will need to be able to tolerate PO                            Will  likely need home health, home O2, set up                                   Consult Orders  (From admission, onward)           Start     Ordered   10/18/23 1848  Consult to hospitalist  Pg by Viviann Spare  Once       Provider:  (Not yet assigned)  Question Answer Comment  Place call to: [facility]   Reason for Consult Admit      10/18/23 1847                               Would benefit from PT/OT eval prior to DC  Ordered                   Swallow eval - SLP ordered                                     Transition of care consulted                   Nutrition    consulted                     Consults called: none     Admission status:  ED Disposition     ED Disposition  Admit   Condition  --   Comment  Hospital Area: [facility] [[id number]]  Level of Care: Progressive [102]  Admit to Progressive based on following criteria: CARDIOVASCULAR & THORACIC of moderate stability with acute coronary syndrome symptoms/low risk myocardial infarction/hypertensive urgency/arrhythmias/heart failure potentially compromising stability and stable post cardiovascular intervention patients.  May admit patient to [facility] or [facility] if equivalent level of care is available:: No  Covid Evaluation: Asymptomatic - no recent exposure (last 10 days) testing not required  Diagnosis: Acute respiratory failure with hypoxia [facility]) [[id number]]  Admitting Physician: Therisa Doyne [[id number]]  Attending Physician: Therisa Doyne [[id number]]  Certification:: I certify this patient will need inpatient services for at least 2 midnights  Expected Medical Readiness: 10/21/2023           inpatient     I Expect 2 midnight stay secondary to severity of patient's current illness need for inpatient interventions justified by the following:  hemodynamic instability despite optimal treatment (tachycardia  hypotension hypoxia, )   and extensive comorbidities including:  DM2   CHF   Chronic anticoagulation  That are currently affecting medical management.   I expect  patient to be hospitalized for 2 midnights requiring inpatient medical care.  Patient is at high risk for adverse outcome (such as loss of life or disability) if not treated.  Indication for inpatient stay as follows:   Hemodynamic instability despite maximal medical therapy,  New or worsening hypoxia   Need for IV diuretics    Level of care     progressive       tele indefinitely please discontinue once patient no longer qualifies COVID-19 Labs   Lab Results  Component Value Date   SARSCOV2NAA NEGATIVE 03/29/2019     Therisa Doyne 10/18/2023, 9:56 PM    [facility]     after 2 AM please page floor coverage PA If 7AM-7PM, please contact the day team taking care of the patient using [email or web address]

## 2023-10-18 NOTE — ED Provider Notes (Signed)
 [facility] EMERGENCY DEPARTMENT AT [facility] Provider Note   CSN: [id number] Arrival date & time: 10/18/23  1043     History  Chief Complaint  Patient presents with   Weakness   decrease appetite   Weight Loss   Shortness of Breath   Skin Ulcer    Todd [name] is a 83 y.o. male.  The history is provided by the patient, the spouse, a relative, the EMS personnel and medical records. No language interpreter was used.  Weakness Associated symptoms: shortness of breath   Shortness of Breath    83 year old male history of peripheral vascular disease, CAD, atrial fibrillation currently on Xarelto, diabetes, hypertension accompanied by daughter to the ER with concerns of weakness. Pt was instructed by PCP to come to the ED today for bilateral weakness in his legs that has been progressively worsening since 2020 but has now worsened to limit activities of daily living and cause stumbling since Dec 2024. Family reports general health has worsened since 2020 when patient had COVID including decreased appetite, weight loss (~80lbs), chronic cough, and weakness presenting for today. In addition to leg weakness, patient reports pain in right elbow after feeling a "pop" upon waking two weeks ago; sacral ulcer (stage 2); swelling of the lower legs; intermittent SOB; and intermittent periumbilical pain. Patient denies nausea/vomiting/fever. Patient denies current chest pain or SOB.   Home Medications Prior to Admission medications   Medication Sig Start Date End Date Taking? Authorizing Provider  acetaminophen (TYLENOL) 650 MG CR tablet Take 650 mg by mouth every 8 (eight) hours as needed for pain.    [provider]  atorvastatin (LIPITOR) 80 MG tablet Take 1 tablet (80 mg total) by mouth daily. 01/10/23   Hermelinda Dellen, MD  cholecalciferol (VITAMIN D3) 25 MCG (1000 UNIT) tablet Take 1,000 Units by mouth daily.    [provider]  famotidine (PEPCID) 40 MG tablet  Take 2 tablets (80 mg total) by mouth at bedtime. 01/09/23   Hermelinda Dellen, MD  furosemide (LASIX) 40 MG tablet Take 1 tablet (40 mg total) by mouth daily. 09/19/23   [name], MD  insulin glargine (LANTUS SOLOSTAR) 100 UNIT/ML Solostar Pen Inject 25 Units into the skin daily. 09/27/23   [name], MD  Insulin Pen Needle (PEN NEEDLES) 32G X 5 MM MISC UAD for daily lantus injection 09/20/23   Burns, [name], MD  ipratropium (ATROVENT) 0.06 % nasal spray Place 2-4 sprays into both nostrils every 8 (eight) hours as needed for rhinitis. 12/04/22   [provider]  metFORMIN (GLUCOPHAGE-XR) 500 MG 24 hr tablet Take 1 tablet (500 mg total) by mouth 2 (two) times daily with a meal. 06/13/23   Burns, [name], MD  metoprolol tartrate (LOPRESSOR) 100 MG tablet TAKE 1 TABLET BY MOUTH 2 TIMES DAILY. 10/02/23   Rollene Rotunda, MD  Multiple Vitamin (MULTIVITAMIN PO) Take 1 tablet by mouth daily.    [provider]  nitroGLYCERIN (NITROSTAT) 0.4 MG SL tablet Place 1 tablet (0.4 mg total) under the tongue every 5 (five) minutes as needed for chest pain. 08/25/21 08/10/07  Rollene Rotunda, MD  Omega-3 Fatty Acids (FISH OIL PO) Take 1 tablet by mouth 2 (two) times daily.    [provider]  omeprazole (PRILOSEC) 40 MG capsule TAKE 1 CAPSULE BY MOUTH 2 TIMES DAILY. 05/18/23   [name], MD  ONETOUCH DELICA LANCETS 33G MISC TEST BLOOD SUGAR ONCE DAILY 01/23/18  [name], MD  ONETOUCH VERIO test strip USE UP TO 4 TIMES A DAY AS DIRECTED 07/06/21   Burns, [name], MD  predniSONE (DELTASONE) 10 MG tablet TAKE 1 TABLET (10 MG TOTAL) BY MOUTH DAILY WITH BREAKFAST. 05/05/23   Mannam, Colbert Coyer, MD  predniSONE (DELTASONE) 50 MG tablet Take tablet (50mg ) by mouth 13 hours, 7 hours, 1 hour prior to scan. 10/16/23   Lanier Prude, MD  rivaroxaban (XARELTO) 20 MG TABS tablet TAKE 1 TABLET (20 MG TOTAL) BY MOUTH DAILY WITH SUPPER. 09/20/23   Rollene Rotunda, MD      Allergies     Amlodipine besylate, Iodinated contrast media, and Metformin and related    Review of Systems   Review of Systems  Respiratory:  Positive for shortness of breath.   Neurological:  Positive for weakness.  All other systems reviewed and are negative.   Physical Exam Updated Vital Signs BP 125/75   Pulse 61   Temp 98.3 F (36.8 C)   Resp 17   SpO2 100%  Physical Exam Vitals and nursing note reviewed.  Constitutional:      General: He is not in acute distress.    Appearance: He is well-developed.  HENT:     Head: Atraumatic.  Eyes:     Conjunctiva/sclera: Conjunctivae normal.  Neck:     Vascular: No JVD.  Cardiovascular:     Rate and Rhythm: Tachycardia present. Rhythm irregular.  Pulmonary:     Effort: Tachypnea present.     Breath sounds: Decreased breath sounds, rhonchi and rales present. No wheezing.  Chest:     Chest wall: No tenderness.  Musculoskeletal:     Cervical back: Neck supple.     Right lower leg: Edema present.     Left lower leg: Edema present.  Skin:    Findings: No rash.  Neurological:     General: No focal deficit present.     Mental Status: He is alert.     ED Results / Procedures / Treatments   Labs (all labs ordered are listed, but only abnormal results are displayed) Labs Reviewed  COMPREHENSIVE METABOLIC PANEL - Abnormal; Notable for the following components:      Result Value   Sodium 133 (*)    Chloride 96 (*)    Glucose, Bld 507 (*)    BUN 34 (*)    Albumin 2.3 (*)    ALT 51 (*)    All other components within normal limits  CBC WITH DIFFERENTIAL/PLATELET - Abnormal; Notable for the following components:   RBC 3.51 (*)    Hemoglobin 12.0 (*)    HCT 35.1 (*)    MCH 34.2 (*)    RDW 16.1 (*)    nRBC 0.3 (*)    Lymphs Abs 0.3 (*)    All other components within normal limits  BRAIN NATRIURETIC PEPTIDE - Abnormal; Notable for the following components:   B Natriuretic Peptide 738.0 (*)    All other components within normal  limits  CBG MONITORING, ED - Abnormal; Notable for the following components:   Glucose-Capillary >600 (*)    All other components within normal limits  TROPONIN I (HIGH SENSITIVITY) - Abnormal; Notable for the following components:   Troponin I (High Sensitivity) 62 (*)    All other components within normal limits  URINALYSIS, ROUTINE W REFLEX MICROSCOPIC  TROPONIN I (HIGH SENSITIVITY)    EKG EKG Interpretation Date/Time:  Thursday October 18 2023 11:13:01 EST Ventricular Rate:  118  PR Interval:    QRS Duration:  82 QT Interval:  346 QTC Calculation: 484 R Axis:   48  Text Interpretation: Atrial fibrillation with rapid ventricular response Anterior infarct , age undetermined ST & T wave abnormality, consider lateral ischemia Abnormal ECG Confirmed by Pricilla Loveless [phone]) on 10/18/2023 11:32:20 AM  Radiology DG Chest 2 View Result Date: 10/18/2023 CLINICAL DATA:  [id number] Cough in adult [id number] EXAM: CHEST - 2 VIEW COMPARISON:  06/06/2023 FINDINGS: Heart size that is post sternotomy and CABG. Aortic atherosclerosis. Extensive coarsened interstitial markings compatible with none interstitial lung disease. No definite superimposed airspace opacity. No pleural effusion or pneumothorax. IMPRESSION: Chronic interstitial lung disease without superimposed airspace opacity. Electronically Signed   By: Duanne Guess D.O.   On: 10/18/2023 15:42   DG Elbow Complete Right Result Date: 10/18/2023 CLINICAL DATA:  Skin infection EXAM: RIGHT ELBOW - COMPLETE 3+ VIEW COMPARISON:  None Available. FINDINGS: There is no evidence of fracture, dislocation, or joint effusion. No erosion or periosteal elevation. There is no evidence of arthropathy or other focal bone abnormality. Soft tissue swelling of the posterior elbow and proximal forearm. No soft tissue air. IMPRESSION: Soft tissue swelling of the posterior elbow and proximal forearm. No acute osseous abnormality. Electronically Signed   By: Duanne Guess D.O.   On: 10/18/2023 15:41    Procedures Procedures    Medications Ordered in ED Medications  sodium chloride 0.9 % bolus 500 mL (0 mLs Intravenous Stopped 10/18/23 1927)  insulin aspart (novoLOG) injection 10 Units (10 Units Subcutaneous Given 10/18/23 1826)  furosemide (LASIX) injection 40 mg (40 mg Intravenous Given 10/18/23 1820)    ED Course/ Medical Decision Making/ A&P Clinical Course as of 10/18/23 1656  Thu Oct 18, 2023  1631 B Natriuretic Peptide(!): 738.0 [LS]    Clinical Course User Index [LS] Meade Maw, [place] Neighbor                                 Medical Decision Making Amount and/or Complexity of Data Reviewed Labs: ordered. Radiology: ordered.  Risk Prescription drug management.   BP (!) 129/97 (BP Location: Right Arm)   Pulse (!) 104   Temp 97.7 F (36.5 C) (Oral)   Resp (!) 24   SpO2 94%   38:41 PM 83 year old male significant history of hypertension, hyperlipidemia, diabetes, CAD status post CABG, atrial fibrillation diagnosed in May 2024 currently on Xarelto for stroke prophylaxis, sent here from PCP office today with concerns of generalized weakness.  Home history obtained through family member at bedside and through patient.  For the past several months patient has had increasing leg weakness with increased swelling to his lower extremities.  He endorsed not having much of appetite and not eating and drinking quite as much.  He also complaining of pain to his right elbow that started 2 weeks ago when he felt a pop follows with swelling without any obvious trauma that he can recall.  He also has history of pulmonary fibroid for which he takes prednisone on a regular basis.  He is scheduled to have MRI of his chest in preparation for a Watchman procedure and have been taking increased thyroid because he is allergic to iodine contrast media.  He went to see his PCP today for checkup prior to the procedure but PCP felt with his progressive worsening  weakness and increased swelling of his legs despite taking fluid pills that he needs  to be evaluated in the ER and likely admission.  This history was obtained through patient's daughter at bedside.  At this time patient denies having fever or chills no nausea vomiting diarrhea no chest pain no productive cough and no other complaint.  No complaint of back pain bowel bladder incontinence or saddle anesthesia.  On exam patient is hard of hearing but appears to be in no acute discomfort.  He is mildly tachypneic and is tachycardic, he does have crackles in his lungs consistent with pulmonary fibrosis.  He does have 3+ pitting edema to his bilateral lower extremity extending towards his knees with palpable dorsalis pedis pulses.  He does not have any reproducible lower back tenderness and he is able to move all 4 extremities with equal effort.  He does have some tenderness to his right elbow with some mild soft tissue swelling without signs of cellulitis or restriction of movement to suggest septic joint.  -Labs ordered, independently viewed and interpreted by me.  Labs remarkable for initial CBG greater than 600 but it did improve with gentle hydration and 10 units of insulin and now it is 500.  BNP is elevated at 738 similar to prior, troponin is 62, improved from prior -The patient was maintained on a cardiac monitor.  I personally viewed and interpreted the cardiac monitored which showed an underlying rhythm of: A-fib with RVR -Imaging independently viewed and interpreted by me and I agree with radiologist's interpretation.  Result remarkable for x-ray of right elbow shows soft tissue swelling without any fracture or dislocation.  Exam notable for mild swelling but no signs of septic joint or cellulitis of the right elbow.  Chest x-ray shows chronic interstitial lung disease without superimposed airspace opacity. -This patient presents to the ED for concern of weakness, this involves an extensive number of  treatment options, and is a complaint that carries with it a high risk of complications and morbidity.  The differential diagnosis includes CHF exacerbation, COPD, anemia, electrolyte derangement, hyperglycemia, deconditioning, peripheral edema -Co morbidities that complicate the patient evaluation includes CHF, A-fib, pulmonary fibrosis -Treatment includes IV fluid, insulin, Lasix -Reevaluation of the patient after these medicines showed that the patient improved -PCP office notes or outside notes reviewed -Discussion with attending DR. Paterson.  I have also consulted [facility] Dr. Adela Glimpse who agrees to admit pt for CHF exacerbation.  -Escalation to admission/observation considered: patient is agreeable with admission        Final Clinical Impression(s) / ED Diagnoses Final diagnoses:  Acute on chronic congestive heart failure, unspecified heart failure type [facility])  General weakness    Rx / DC Orders ED Discharge Orders     None         Fayrene Helper, PA-C 10/18/23 1933    Rondel Baton, MD 10/22/23 1008

## 2023-10-18 NOTE — ED Provider Triage Note (Signed)
 Emergency Medicine Provider Triage Evaluation Note  Todd [name] , a 83 y.o. male  was evaluated in triage.  Pt complains of generalized weakness.  Family reports that the patient was sent over from PCP office for admission.  He has been getting weaker over the last month or more.  He has bilateral leg weakness, leg swelling, about 1 week of a right elbow redness, and is due for a Watchman procedure but not strong enough.  Is in and out of A-fib.  Review of Systems  Positive: Right elbow infection, bilateral leg weakness and edema Negative: Chest pain, shortness of breath  Physical Exam  BP 109/70 (BP Location: Left Arm)   Pulse (!) 58   Temp 97.9 F (36.6 C)   Resp 18   SpO2 98%  Gen:   Awake, no distress  Resp:  Normal effort MSK:   Pitting edema to the bilateral lower extremities Other:  Normal range of motion of right elbow.  Just proximal to the joint there is some nonspecific mild redness.  Medical Decision Making  Medically screening exam initiated at 11:41 AM.  Appropriate orders placed.  Todd [name] was informed that the remainder of the evaluation will be completed by another provider, this initial triage assessment does not replace that evaluation, and the importance of remaining in the ED until their evaluation is complete.  Screening labs and x-rays ordered.  ECG shows A-fib.   Pricilla Loveless, MD 10/18/23 [phone]

## 2023-10-18 NOTE — ED Notes (Signed)
 Dinner tray ordered.

## 2023-10-18 NOTE — ED Notes (Signed)
 Pt's family request to please call Jorja Loa (Daughter) [phone] when pt is moved to a room upstairs.

## 2023-10-18 NOTE — Assessment & Plan Note (Signed)
 In the setting of recent high prednisone dose Hx of chronic leg swelling Last echo with preserved EF

## 2023-10-19 ENCOUNTER — Inpatient Hospital Stay (HOSPITAL_COMMUNITY): Payer: PPO

## 2023-10-19 ENCOUNTER — Other Ambulatory Visit (HOSPITAL_COMMUNITY): Payer: PPO

## 2023-10-19 ENCOUNTER — Encounter (HOSPITAL_COMMUNITY): Payer: Self-pay | Admitting: Internal Medicine

## 2023-10-19 DIAGNOSIS — I480 Paroxysmal atrial fibrillation: Secondary | ICD-10-CM | POA: Diagnosis not present

## 2023-10-19 DIAGNOSIS — R0609 Other forms of dyspnea: Secondary | ICD-10-CM

## 2023-10-19 DIAGNOSIS — U071 COVID-19: Secondary | ICD-10-CM | POA: Diagnosis not present

## 2023-10-19 DIAGNOSIS — R600 Localized edema: Secondary | ICD-10-CM | POA: Diagnosis not present

## 2023-10-19 DIAGNOSIS — E1151 Type 2 diabetes mellitus with diabetic peripheral angiopathy without gangrene: Secondary | ICD-10-CM | POA: Diagnosis not present

## 2023-10-19 LAB — COMPREHENSIVE METABOLIC PANEL
ALT: 45 U/L — ABNORMAL HIGH (ref 0–44)
AST: 34 U/L (ref 15–41)
Albumin: 2.2 g/dL — ABNORMAL LOW (ref 3.5–5.0)
Alkaline Phosphatase: 83 U/L (ref 38–126)
Anion gap: 12 (ref 5–15)
BUN: 29 mg/dL — ABNORMAL HIGH (ref 8–23)
CO2: 29 mmol/L (ref 22–32)
Calcium: 9.2 mg/dL (ref 8.9–10.3)
Chloride: 99 mmol/L (ref 98–111)
Creatinine, Ser: 0.74 mg/dL (ref 0.61–1.24)
GFR, Estimated: >60 mL/min (ref >60)
Glucose, Bld: 66 mg/dL — ABNORMAL LOW (ref 70–99)
Potassium: 3.9 mmol/L (ref 3.5–5.1)
Sodium: 140 mmol/L (ref 135–145)
Total Bilirubin: 0.7 mg/dL (ref 0.0–1.2)
Total Protein: 6.5 g/dL (ref 6.5–8.1)

## 2023-10-19 LAB — CBC
HCT: 34.4 % — ABNORMAL LOW (ref 39.0–52.0)
Hemoglobin: 12.1 g/dL — ABNORMAL LOW (ref 13.0–17.0)
MCH: 34.5 pg — ABNORMAL HIGH (ref 26.0–34.0)
MCHC: 35.2 g/dL (ref 30.0–36.0)
MCV: 98 fL (ref 80.0–100.0)
Platelets: 262 10*3/uL (ref 150–400)
RBC: 3.51 MIL/uL — ABNORMAL LOW (ref 4.22–5.81)
RDW: 16.8 % — ABNORMAL HIGH (ref 11.5–15.5)
WBC: 10.3 10*3/uL (ref 4.0–10.5)
nRBC: 0.3 % — ABNORMAL HIGH (ref 0.0–0.2)

## 2023-10-19 LAB — BASIC METABOLIC PANEL
Anion gap: 12 (ref 5–15)
BUN: 31 mg/dL — ABNORMAL HIGH (ref 8–23)
CO2: 25 mmol/L (ref 22–32)
Calcium: 7.9 mg/dL — ABNORMAL LOW (ref 8.9–10.3)
Chloride: 98 mmol/L (ref 98–111)
Creatinine, Ser: 0.83 mg/dL (ref 0.61–1.24)
GFR, Estimated: >60 mL/min (ref >60)
Glucose, Bld: 324 mg/dL — ABNORMAL HIGH (ref 70–99)
Potassium: 2.9 mmol/L — ABNORMAL LOW (ref 3.5–5.1)
Sodium: 135 mmol/L (ref 135–145)

## 2023-10-19 LAB — MAGNESIUM
Magnesium: 1.4 mg/dL — ABNORMAL LOW (ref 1.7–2.4)
Magnesium: 2.9 mg/dL — ABNORMAL HIGH (ref 1.7–2.4)

## 2023-10-19 LAB — GLUCOSE, RANDOM: Glucose, Bld: 415 mg/dL — ABNORMAL HIGH (ref 70–99)

## 2023-10-19 LAB — ECHOCARDIOGRAM COMPLETE
AR max vel: 1.75 cm2
AV Area VTI: 1.61 cm2
AV Area mean vel: 1.69 cm2
AV Mean grad: 2.3 mm[Hg]
AV Peak grad: 3.9 mm[Hg]
Ao pk vel: 0.99 m/s
Area-P 1/2: 4.39 cm2
Est EF: 45
Height: 66 in
S' Lateral: 2.6 cm
Weight: 2208 [oz_av]

## 2023-10-19 LAB — GLUCOSE, CAPILLARY
Glucose-Capillary: 439 mg/dL — ABNORMAL HIGH (ref 70–99)
Glucose-Capillary: 277 mg/dL — ABNORMAL HIGH (ref 70–99)

## 2023-10-19 LAB — CBG MONITORING, ED
Glucose-Capillary: 104 mg/dL — ABNORMAL HIGH (ref 70–99)
Glucose-Capillary: 286 mg/dL — ABNORMAL HIGH (ref 70–99)
Glucose-Capillary: 295 mg/dL — ABNORMAL HIGH (ref 70–99)
Glucose-Capillary: 62 mg/dL — ABNORMAL LOW (ref 70–99)
Glucose-Capillary: 73 mg/dL (ref 70–99)

## 2023-10-19 LAB — URIC ACID: Uric Acid, Serum: 4.8 mg/dL (ref 3.7–8.6)

## 2023-10-19 LAB — TROPONIN I (HIGH SENSITIVITY)
Troponin I (High Sensitivity): 65 ng/L — ABNORMAL HIGH (ref <18)
Troponin I (High Sensitivity): 99 ng/L — ABNORMAL HIGH (ref <18)

## 2023-10-19 LAB — PHOSPHORUS
Phosphorus: 2.9 mg/dL (ref 2.5–4.6)
Phosphorus: 3.2 mg/dL (ref 2.5–4.6)

## 2023-10-19 LAB — CK: Total CK: 82 U/L (ref 49–397)

## 2023-10-19 LAB — HEMOGLOBIN A1C
Hgb A1c MFr Bld: 12.7 % — ABNORMAL HIGH (ref 4.8–5.6)
Mean Plasma Glucose: 317.79 mg/dL

## 2023-10-19 LAB — PROCALCITONIN: Procalcitonin: <0.1 ng/mL

## 2023-10-19 MED ORDER — INSULIN ASPART 100 UNIT/ML IJ SOLN
0.0000 [IU] | Freq: Three times a day (TID) | INTRAMUSCULAR | Status: DC
  Administered 2023-10-20: 2 [IU] via SUBCUTANEOUS

## 2023-10-19 MED ORDER — MAGNESIUM SULFATE 2 GM/50ML IV SOLN
2.0000 g | Freq: Once | INTRAVENOUS | Status: AC
  Administered 2023-10-19: 2 g via INTRAVENOUS
  Filled 2023-10-19: qty 50

## 2023-10-19 MED ORDER — POTASSIUM CHLORIDE 10 MEQ/100ML IV SOLN
10.0000 meq | INTRAVENOUS | Status: AC
  Administered 2023-10-19 (×2): 10 meq via INTRAVENOUS
  Filled 2023-10-19 (×2): qty 100

## 2023-10-19 MED ORDER — ACETAMINOPHEN 650 MG RE SUPP: 650.0000 mg | Freq: Four times a day (QID) | RECTAL | Status: DC | PRN

## 2023-10-19 MED ORDER — HYDROCODONE-ACETAMINOPHEN 5-325 MG PO TABS: 1.0000 | ORAL_TABLET | ORAL | Status: DC | PRN

## 2023-10-19 MED ORDER — PREDNISONE 5 MG PO TABS
10.0000 mg | ORAL_TABLET | Freq: Every day | ORAL | Status: DC
  Administered 2023-10-19 – 2023-10-25 (×7): 10 mg via ORAL
  Filled 2023-10-19 (×3): qty 2
  Filled 2023-10-19: qty 1
  Filled 2023-10-19 (×3): qty 2

## 2023-10-19 MED ORDER — INSULIN ASPART 100 UNIT/ML IJ SOLN
4.0000 [IU] | Freq: Once | INTRAMUSCULAR | Status: AC
  Administered 2023-10-19: 4 [IU] via SUBCUTANEOUS

## 2023-10-19 MED ORDER — RIVAROXABAN 20 MG PO TABS
20.0000 mg | ORAL_TABLET | Freq: Every day | ORAL | Status: DC
  Administered 2023-10-19 – 2023-10-24 (×6): 20 mg via ORAL
  Filled 2023-10-19 (×6): qty 1

## 2023-10-19 MED ORDER — GUAIFENESIN ER 600 MG PO TB12
600.0000 mg | ORAL_TABLET | Freq: Two times a day (BID) | ORAL | Status: DC
Start: 2023-10-19 — End: 2023-10-25
  Administered 2023-10-19 – 2023-10-25 (×14): 600 mg via ORAL
  Filled 2023-10-19 (×14): qty 1

## 2023-10-19 MED ORDER — ATORVASTATIN CALCIUM 80 MG PO TABS
80.0000 mg | ORAL_TABLET | Freq: Every day | ORAL | Status: DC
  Administered 2023-10-19 – 2023-10-24 (×6): 80 mg via ORAL
  Filled 2023-10-19 (×6): qty 1

## 2023-10-19 MED ORDER — POTASSIUM CHLORIDE 10 MEQ/100ML IV SOLN
10.0000 meq | INTRAVENOUS | Status: DC
Start: 2023-10-19 — End: 2023-10-19
  Administered 2023-10-19 (×2): 10 meq via INTRAVENOUS
  Filled 2023-10-19 (×3): qty 100

## 2023-10-19 MED ORDER — ALBUTEROL SULFATE (2.5 MG/3ML) 0.083% IN NEBU: 2.5000 mg | INHALATION_SOLUTION | RESPIRATORY_TRACT | Status: DC | PRN

## 2023-10-19 MED ORDER — METOPROLOL TARTRATE 25 MG PO TABS
100.0000 mg | ORAL_TABLET | Freq: Two times a day (BID) | ORAL | Status: DC
  Administered 2023-10-19 (×2): 100 mg via ORAL
  Filled 2023-10-19 (×2): qty 4

## 2023-10-19 MED ORDER — SODIUM CHLORIDE 0.9 % IV SOLN: INTRAVENOUS | Status: AC | PRN

## 2023-10-19 MED ORDER — SODIUM CHLORIDE 0.9% FLUSH: 3.0000 mL | INTRAVENOUS | Status: DC | PRN

## 2023-10-19 MED ORDER — ONDANSETRON HCL 4 MG/2ML IJ SOLN: 4.0000 mg | Freq: Four times a day (QID) | INTRAMUSCULAR | Status: DC | PRN

## 2023-10-19 MED ORDER — SODIUM CHLORIDE 0.9 % IV SOLN: 250.0000 mL | INTRAVENOUS | Status: AC | PRN

## 2023-10-19 MED ORDER — METOPROLOL TARTRATE 5 MG/5ML IV SOLN
5.0000 mg | INTRAVENOUS | Status: DC | PRN
Start: 2023-10-19 — End: 2023-10-19

## 2023-10-19 MED ORDER — SODIUM CHLORIDE 0.9% FLUSH
3.0000 mL | Freq: Two times a day (BID) | INTRAVENOUS | Status: DC
  Administered 2023-10-19 – 2023-10-25 (×13): 3 mL via INTRAVENOUS

## 2023-10-19 MED ORDER — SODIUM CHLORIDE 0.9 % IV BOLUS
500.0000 mL | Freq: Once | INTRAVENOUS | Status: AC
  Administered 2023-10-19: 500 mL via INTRAVENOUS

## 2023-10-19 MED ORDER — ACETAMINOPHEN 325 MG PO TABS: 650.0000 mg | ORAL_TABLET | Freq: Four times a day (QID) | ORAL | Status: DC | PRN

## 2023-10-19 MED ORDER — INSULIN GLARGINE 100 UNIT/ML ~~LOC~~ SOLN
6.0000 [IU] | Freq: Every day | SUBCUTANEOUS | Status: DC
  Administered 2023-10-19: 6 [IU] via SUBCUTANEOUS
  Filled 2023-10-19 (×2): qty 0.06

## 2023-10-19 MED ORDER — INSULIN GLARGINE-YFGN 100 UNIT/ML ~~LOC~~ SOLN: 25.0000 [IU] | Freq: Every day | SUBCUTANEOUS | Status: DC

## 2023-10-19 MED ORDER — ONDANSETRON HCL 4 MG PO TABS: 4.0000 mg | ORAL_TABLET | Freq: Four times a day (QID) | ORAL | Status: DC | PRN

## 2023-10-19 MED ORDER — INSULIN GLARGINE-YFGN 100 UNIT/ML ~~LOC~~ SOLN
12.0000 [IU] | Freq: Every day | SUBCUTANEOUS | Status: DC
  Filled 2023-10-19: qty 0.12

## 2023-10-19 MED ORDER — INSULIN GLARGINE 100 UNIT/ML ~~LOC~~ SOLN: 10.0000 [IU] | Freq: Every day | SUBCUTANEOUS | Status: DC

## 2023-10-19 NOTE — ED Notes (Signed)
 Spoke with Dr Duard Larsen, admitting provider, made him aware of pt's elevated heart rate and rhythm, as well as pt's CBG of 62. He placed order for Metoprolol IV PRN and reviewed EKG

## 2023-10-19 NOTE — Evaluation (Signed)
 Clinical/Bedside Swallow Evaluation Patient Details  Name: Todd [name] MRN: [id number] Date of Birth: [birth year]  Today's Date: 10/19/2023 Time: SLP Start Time (ACUTE ONLY): 7829 SLP Stop Time (ACUTE ONLY): 5621 SLP Time Calculation (min) (ACUTE ONLY): 23 min  Past Medical History:  Past Medical History:  Diagnosis Date   Allergic rhinitis    Asthma    Atrial fibrillation with rapid ventricular response (HCC)    a. newly diagnosed 11/03/13, spont conv to NSR, placed on xarelto.   CAD (coronary artery disease)    a. 1999; s/p CABG: LIMA to LAD, SVG to OM1, SVG to OM2, SVG to RCA  b. Normal nuc 10/2013 (done because of new onset AF.)   COVID    Diabetes mellitus (HCC)    HLD (hyperlipidemia)    HTN (hypertension)    Nephrolithiasis    Overweight(278.02)    PVD (peripheral vascular disease) (HCC)    Carotid stenosis   Stroke [facility])    Past Surgical History:  Past Surgical History:  Procedure Laterality Date   CAROTID ENDARTERECTOMY  1999   COLONOSCOPY  2014   negative;Dr [facility]   COLONOSCOPY W/ POLYPECTOMY  2009   Dr Jarold Motto   CORONARY ARTERY BYPASS GRAFT  Aug.1999   HEMORRHOID BANDING     INTRACAPSULAR CATARACT EXTRACTION Bilateral 2018   LEFT HEART CATH AND CORS/GRAFTS ANGIOGRAPHY N/A 01/08/2023   Procedure: LEFT HEART CATH AND CORS/GRAFTS ANGIOGRAPHY;  Surgeon: [place], Peter M, MD;  Location: [facility] INVASIVE CV LAB;  Service: Cardiovascular;  Laterality: N/A;   NASAL SINUS SURGERY     HPI:  83 yo presenting 2/27 with weakness, found to have COVID. While in the ED he had a coughing and vomiting episode. CXR without acute findings. Pt has seen SLP at outside facility with FEES 03/26/23 showing mild pharyngeal dysphagia likely secondary to generalized deconditioning. Dysphagia was primarily characterized by decreased base of tongue retraction, diminished pharyngeal sensation, and incomplete laryngeal vestibule closure, resulting in mild to moderate post-swallow residual along the  base of tongue and valleculae that increased with viscosity and reduced with a secondary swallow. Penetration but no aspiration observed. He had aggressive coughing after the study that was suspicious for possible esophageal issues/referred sensation or possibly related to talking. Pt follows with ENT for chronic cough and is s/p SLN block with f/u with ENT and swallow study reportedly planned for April. PMH also includes: A.fib on Xarelto, DM2, pulmonary fibrosis, HTN, HLD, DM2, CAD s/p CABG, stroke    Assessment / Plan / Recommendation  Clinical Impression  Pt's oropharyngeal swallow appears to be functional, with recent swallow study from outside facility documenting mild dysphagia and no aspiration. He believes he had trouble with dinner yesterday because he got a piece of chicken that was too hard for him to chew in light of poor dentition. He is open to a softer diet while in the hospital with acute illness with generalized deconditioning. Education was provided about options and he would like to start with mechanical soft solids. Will add thin liquids. Discussed the option of completing his swallow study while inpatient but at this time he prefers to wait until his OP f/u as he says he will need to go to see the ENT anyway. He verbalized precautions/strategies from previous testing. SLP will f/u briefly given history of dysphagia and reported difficulty yesterday. If solids are still difficult for him to masticate, please downgrade to Dys 2 diet while waiting for SLP to return. Could also consider ordering a more  dedicated assessment of the esophagus given previous SLP's concern for esophageal component.   SLP Visit Diagnosis: Dysphagia, oropharyngeal phase (R13.12)    Aspiration Risk       Diet Recommendation Dysphagia 3 (Mech soft);Thin liquid    Liquid Administration via: Cup;Straw Medication Administration: Whole meds with liquid Supervision: Patient able to self feed;Intermittent  supervision to cue for compensatory strategies Compensations: Minimize environmental distractions;Slow rate;Small sips/bites;Follow solids with liquid Postural Changes: Seated upright at 90 degrees;Remain upright for at least 30 minutes after po intake    Other  Recommendations Recommended Consults: Consider esophageal assessment Oral Care Recommendations: Oral care BID    Recommendations for follow up therapy are one component of a multi-disciplinary discharge planning process, led by the attending physician.  Recommendations may be updated based on patient status, additional functional criteria and insurance authorization.  Follow up Recommendations Outpatient SLP      Assistance Recommended at Discharge    Functional Status Assessment Patient has had a recent decline in their functional status and demonstrates the ability to make significant improvements in function in a reasonable and predictable amount of time.  Frequency and Duration min 1 x/week  1 week       Prognosis Prognosis for improved oropharyngeal function: Fair Barriers to Reach Goals: Time post onset      Swallow Study   General HPI: 83 yo presenting 2/27 with weakness, found to have COVID. While in the ED he had a coughing and vomiting episode. CXR without acute findings. Pt has seen SLP at outside facility with FEES 03/26/23 showing mild pharyngeal dysphagia likely secondary to generalized deconditioning. Dysphagia was primarily characterized by decreased base of tongue retraction, diminished pharyngeal sensation, and incomplete laryngeal vestibule closure, resulting in mild to moderate post-swallow residual along the base of tongue and valleculae that increased with viscosity and reduced with a secondary swallow. Penetration but no aspiration observed. He had aggressive coughing after the study that was suspicious for possible esophageal issues/referred sensation or possibly related to talking. Pt follows with ENT for  chronic cough and is s/p SLN block with f/u with ENT and swallow study reportedly planned for April. PMH also includes: A.fib on Xarelto, DM2, pulmonary fibrosis, HTN, HLD, DM2, CAD s/p CABG, stroke Type of Study: Bedside Swallow Evaluation Previous Swallow Assessment: see HPI Diet Prior to this Study: NPO Temperature Spikes Noted: No Respiratory Status: Room air History of Recent Intubation: No Behavior/Cognition: Alert;Cooperative;Pleasant mood;Distractible Oral Cavity Assessment: Within Functional Limits Oral Care Completed by SLP: No Oral Cavity - Dentition: Poor condition;Missing dentition Vision: Functional for self-feeding Self-Feeding Abilities: Able to feed self Patient Positioning: Upright in bed Baseline Vocal Quality: Normal Volitional Swallow: Able to elicit    Oral/Motor/Sensory Function Overall Oral Motor/Sensory Function: Within functional limits   Ice Chips Ice chips: Not tested   Thin Liquid Thin Liquid: Within functional limits Presentation: Cup;Self Fed;Straw    Nectar Thick Nectar Thick Liquid: Not tested   Honey Thick Honey Thick Liquid: Not tested   Puree Puree: Within functional limits Presentation: Self Fed;Spoon   Solid     Solid: Within functional limits Presentation: Self Fed      Mahala Menghini., M.A. CCC-SLP Acute Rehabilitation Services Office [phone]  Secure chat preferred  10/19/2023,10:17 AM

## 2023-10-19 NOTE — ED Notes (Signed)
 Pt's HR is elevated and pt appears to be in A-Fib RVR. EKG performed and will make provider aware. Pt denies SOB/CP

## 2023-10-19 NOTE — ED Notes (Signed)
 Patient transported to echo via pt transport. Pt on 2 lpm 02 [place]

## 2023-10-19 NOTE — Progress Notes (Signed)
*  PRELIMINARY RESULTS* Echocardiogram 2D Echocardiogram has been performed.  Laddie Aquas 10/19/2023, 11:55 AM

## 2023-10-19 NOTE — ED Notes (Signed)
 PT Given OJ

## 2023-10-19 NOTE — Progress Notes (Signed)
 Family does not want patient to take prednisone.

## 2023-10-19 NOTE — Evaluation (Signed)
 Physical Therapy Evaluation Patient Details Name: Todd [name] MRN: [id number] DOB: October 20, 1940 Today's Date: 10/19/2023  History of Present Illness  Pt is 83 yo male presented on 10/18/23 with progressive weakness and shortness of breath and worsening LE edema.  Pt found to bed COVID +.  Pt with hx including but not limited to A.fib on Xarelto, DM2, pulmonary fibrosis, HTN, HLD, DM2, CAD sp CABG  Clinical Impression  Pt admitted with above diagnosis. At baseline, pt from home with wife.  He normally ambulates without AD but does have assist with lower body dressing.  Pt and family reports worsening LE edema and weakness over the past several weeks that has led to decreased mobility.  Today, pt ambulating in room but fatigued easily and needed min A for transfers.  Pt did require 2L O2 to maintain O2 sats with activity.  Pt expected to progress well. Pt currently with functional limitations due to the deficits listed below (see PT Problem List). Pt will benefit from acute skilled PT to increase their independence and safety with mobility to allow discharge.  Do recommend HHPT at d/c to progress, particularly as pt been getting weaker for past several weeks.          If plan is discharge home, recommend the following: A little help with walking and/or transfers;A little help with bathing/dressing/bathroom;Assistance with cooking/housework;Help with stairs or ramp for entrance   Can travel by private vehicle        Equipment Recommendations Rolling walker (2 wheels)  Recommendations for Other Services       Functional Status Assessment Patient has had a recent decline in their functional status and demonstrates the ability to make significant improvements in function in a reasonable and predictable amount of time.     Precautions / Restrictions Precautions Precautions: Fall      Mobility  Bed Mobility Overal bed mobility: Needs Assistance Bed Mobility: Supine to Sit, Sit to Supine      Supine to sit: Min assist Sit to supine: Min assist   General bed mobility comments: Light min A to lift trunk up and light min A for legs back to bed    Transfers Overall transfer level: Needs assistance Equipment used: Rolling walker (2 wheels) Transfers: Sit to/from Stand Sit to Stand: Min assist           General transfer comment: Light min A and cues for hand placement.  Stood from bed and toilet    Ambulation/Gait Ambulation/Gait assistance: Contact guard assist Gait Distance (Feet): 25 Feet Assistive device: Rolling walker (2 wheels) Gait Pattern/deviations: Step-to pattern, Decreased stride length Gait velocity: normal     General Gait Details: Ambulated in room ; fatigued easily ; steady with RW  Stairs            Wheelchair Mobility     Tilt Bed    Modified Rankin (Stroke Patients Only)       Balance Overall balance assessment: Needs assistance Sitting-balance support: No upper extremity supported Sitting balance-Leahy Scale: Good       Standing balance-Leahy Scale: Fair Standing balance comment: RW to ambualte but could static stand without support                             Pertinent Vitals/Pain Pain Assessment Pain Assessment: Faces Faces Pain Scale: Hurts a little bit Pain Location: buttock in sitting (reports 2 wounds) Pain Descriptors / Indicators: Discomfort Pain Intervention(s): Limited  activity within patient's tolerance, Monitored during session, Repositioned (returned to bed not chair)    Home Living Family/patient expects to be discharged to:: Private residence Living Arrangements: Spouse/significant other Available Help at Discharge: Family;Available 24 hours/day Type of Home: House Home Access: Ramped entrance       Home Layout: One level Home Equipment: Agricultural consultant (2 wheels);Rollator (4 wheels) Additional Comments: counter is next to toilet    Prior Function               Mobility  Comments: Uses RW at night to go to bathroom but otherwise no AD; Limited community ambulatory; reports "dizzy and wobble but doesn't fall" ADLs Comments: Pt independent with toielting and showers; wife assist with lower body dressing (weak LE baseline)     Extremity/Trunk Assessment   Upper Extremity Assessment Upper Extremity Assessment: Overall WFL for tasks assessed    Lower Extremity Assessment Lower Extremity Assessment: LLE deficits/detail;RLE deficits/detail RLE Deficits / Details: ROM WFL; MMT 4/5; edema + but family reports significant improvement LLE Deficits / Details: ROM WFL; MMT 4/5; edema + but family reports significant improvement    Cervical / Trunk Assessment Cervical / Trunk Assessment: Normal  Communication   Communication Communication: Impaired Factors Affecting Communication: Hearing impaired    Cognition Arousal: Alert Behavior During Therapy: WFL for tasks assessed/performed   PT - Cognitive impairments: No apparent impairments                         Following commands: Intact       Cueing       General Comments General comments (skin integrity, edema, etc.): Pt on 2 L O2 with sats 97%.  Tried RA and sats maintained 93% rest but with activity down to 87%.  Recovers quickly <20 sec.  Placed back on 2 L end of session with sats 96%    Exercises     Assessment/Plan    PT Assessment Patient needs continued PT services  PT Problem List Decreased range of motion;Decreased activity tolerance;Decreased balance;Decreased mobility;Decreased strength;Cardiopulmonary status limiting activity;Decreased knowledge of use of DME       PT Treatment Interventions DME instruction;Therapeutic exercise;Gait training;Stair training;Functional mobility training;Therapeutic activities;Balance training;Neuromuscular re-education;Patient/family education    PT Goals (Current goals can be found in the Care Plan section)  Acute Rehab PT Goals Patient  Stated Goal: return home PT Goal Formulation: With patient/family Time For Goal Achievement: 11/02/23 Potential to Achieve Goals: Good    Frequency Min 1X/week     Co-evaluation               AM-PAC PT "6 Clicks" Mobility  Outcome Measure Help needed turning from your back to your side while in a flat bed without using bedrails?: A Little Help needed moving from lying on your back to sitting on the side of a flat bed without using bedrails?: A Little Help needed moving to and from a bed to a chair (including a wheelchair)?: A Little Help needed standing up from a chair using your arms (e.g., wheelchair or bedside chair)?: A Little Help needed to walk in hospital room?: A Little Help needed climbing 3-5 steps with a railing? : A Little 6 Click Score: 18    End of Session Equipment Utilized During Treatment: Gait belt Activity Tolerance: Patient limited by fatigue (Tolerated but did fatigue easily and want to return to bed) Patient left: in bed;with call bell/phone within reach;with bed alarm set;with family/visitor present Nurse  Communication: Mobility status PT Visit Diagnosis: Other abnormalities of gait and mobility (R26.89);Muscle weakness (generalized) (M62.81)    Time: 0981-1914 PT Time Calculation (min) (ACUTE ONLY): 27 min   Charges:   PT Evaluation $PT Eval Low Complexity: 1 Low PT Treatments $Therapeutic Activity: 8-22 mins PT General Charges $$ ACUTE PT VISIT: 1 Visit         Anise Salvo, PT Acute Rehab [facility] [phone]   Rayetta Humphrey 10/19/2023, 4:14 PM

## 2023-10-19 NOTE — ED Notes (Addendum)
 Spoke with Dr Adela Glimpse, admitting provider, regarding recent lab results and inquired about need for pt to get Potassium since we were giving him Lasix and Insulin and his potassium level is low. Verbal order received for IV Potassium x 4 .

## 2023-10-19 NOTE — ED Notes (Signed)
Pt transported to ECHO. 

## 2023-10-19 NOTE — ED Notes (Addendum)
 Pt had NS running at 75 ml/hr with no order from nights. Fluids and k paused while order is obtained.

## 2023-10-19 NOTE — Progress Notes (Addendum)
 [facility] PROGRESS NOTE   Todd [name] ZOX:[id number] DOB: 10-16-1940 DOA: 10/18/2023  PCP: [name], MD  Brief History:  83 y.o. male with medical history significant of A.fib on Xarelto, DM2, pulmonary fibrosis, HTN, HLD, DM2, CAD sp CABG, who presented with progressive weakness, shortness of breath.  Apparently had an episode of choking and vomiting when he was in the emergency department.  Has a history of pulmonary fibrosis for which he is on prednisone.  Also mentioned worsening lower extremity edema.   Consultants: None  Procedures: None    Subjective/Interval History: Patient denies any chest pain or shortness of breath.  No nausea vomiting.  Concerned about his leg swelling.  Seems to be somewhat confused.  Noted to be coughing a lot.    Assessment/Plan:  COVID-19 infection Patient mentioned that he has a history of chronic cough since his prior episode of COVID-19 3 years ago. Chest x-ray shows chronic changes without any superimposed opacities. He is already on steroids for his pulmonary fibrosis.  This is being continued.  No clear indication to initiate antivirals.  Supportive care will be provided.  Generalized weakness Likely secondary to COVID-19.  PT and OT evaluation.  Concern for oropharyngeal dysphagia Apparently had a choking episode in the emergency department overnight.  NPO.  SLP evaluation is pending.  Elevated troponin Denies any chest pain.  Possibly due to demand ischemia.  Echocardiogram is pending.  Lower extremity edema Last echo from 2024 showed normal LVEF.  Mild LVH was noted.  Right ventricular function was normal. Patient has been started on IV furosemide.  Follow-up on echocardiogram to see if there is any worsening in his heart function. Steroids could also be contributing to his edema. LE dopplers.  Paroxysmal atrial fibrillation Noted to be on Xarelto for anticoagulation which is being continued.  Noted to be on  metoprolol.  Monitor on telemetry.  Diabetes mellitus type 2, uncontrolled Noted to be on glargine and SSI.  CBG monitoring. Noted to have episodes of hypoglycemia earlier this morning.  Possibly due to n.p.o. status.  Hold glargine.  Right elbow pain No bony injuries identified on imaging studies.  Continue to monitor. No evidence for cellulitis on examination.  DVT Prophylaxis: On Xarelto Code Status: Full code Family Communication: No family at bedside Disposition Plan: Hopefully return home when improved.  PT and OT evaluation.  Status is: Inpatient Remains inpatient appropriate because: Fluid overload, COVID-19, physical deconditioning      Medications: Scheduled:  atorvastatin  80 mg Oral QHS   furosemide  40 mg Intravenous Daily   guaiFENesin  600 mg Oral BID   insulin aspart  0-9 Units Subcutaneous Q4H   insulin glargine-yfgn  12 Units Subcutaneous QHS   metoprolol tartrate  100 mg Oral BID   predniSONE  10 mg Oral Q breakfast   rivaroxaban  20 mg Oral Q supper   sodium chloride flush  3 mL Intravenous Q12H   Continuous:  sodium chloride     sodium chloride 10 mL/hr at 10/19/23 0936   WJX:BJYNWG chloride, sodium chloride, acetaminophen **OR** acetaminophen, albuterol, HYDROcodone-acetaminophen, metoprolol tartrate, ondansetron **OR** ondansetron (ZOFRAN) IV, sodium chloride flush  Antibiotics: Anti-infectives (From admission, onward)    None       Objective:  Vital Signs  Vitals:   10/19/23 0600 10/19/23 0740 10/19/23 0800 10/19/23 0916  BP: (!) 119/105  113/79 113/79  Pulse: (!) 55  (!) 57 (!) 109  Resp: (!) 21  (!) 21  Temp:  97.7 F (36.5 C)    TempSrc:  Oral    SpO2: 100%  100%     Intake/Output Summary (Last 24 hours) at 10/19/2023 1023 Last data filed at 10/19/2023 0904 Gross per 24 hour  Intake 751.17 ml  Output 200 ml  Net 551.17 ml    General appearance: Awake alert.  In no distress.  Distracted Resp: Clear to auscultation  bilaterally.  Normal effort Cardio: S1-S2 is normal regular.  No S3-S4.  No rubs murmurs or bruit GI: Abdomen is soft.  Nontender nondistended.  Bowel sounds are present normal.  No masses organomegaly Extremities: 1-2+ pitting edema bilateral lower extremities Neurologic: No focal neurological deficits.    Lab Results:  Data Reviewed: I have personally reviewed following labs and reports of the imaging studies  CBC: Recent Labs  Lab 10/18/23 1141 10/19/23 0432  WBC 7.2 10.3  NEUTROABS 6.8  --   HGB 12.0* 12.1*  HCT 35.1* 34.4*  MCV 100.0 98.0  PLT 268 262    Basic Metabolic Panel: Recent Labs  Lab 10/18/23 1141 10/18/23 2349 10/19/23 0432  NA 133* 135 140  K 4.4 2.9* 3.9  CL 96* 98 99  CO2 29 25 29   GLUCOSE 507* 324* 66*  BUN 34* 31* 29*  CREATININE 0.98 0.83 0.74  CALCIUM 8.9 7.9* 9.2  MG  --  1.4* 2.9*  PHOS  --  2.9 3.2    GFR: Estimated Creatinine Clearance: 65.2 mL/min (by C-G formula based on SCr of 0.74 mg/dL).  Liver Function Tests: Recent Labs  Lab 10/18/23 1141 10/19/23 0432  AST 38 34  ALT 51* 45*  ALKPHOS 87 83  BILITOT 0.8 0.7  PROT 6.6 6.5  ALBUMIN 2.3* 2.2*    Cardiac Enzymes: Recent Labs  Lab 10/18/23 2349  CKTOTAL 82    HbA1C: Recent Labs    10/18/23 2349  HGBA1C 12.7*    CBG: Recent Labs  Lab 10/18/23 2346 10/19/23 0056 10/19/23 0438 10/19/23 0550 10/19/23 0806  GLUCAP 361* 286* 62* 104* 73     Recent Results (from the past 240 hours)  Resp panel by RT-PCR (RSV, Flu A&B, Covid) Anterior Nasal Swab     Status: Abnormal   Collection Time: 10/18/23  8:56 PM   Specimen: Anterior Nasal Swab  Result Value Ref Range Status   SARS Coronavirus 2 by RT PCR POSITIVE (A) NEGATIVE Final   Influenza A by PCR NEGATIVE NEGATIVE Final   Influenza B by PCR NEGATIVE NEGATIVE Final    Comment: (NOTE) The Xpert Xpress SARS-CoV-2/FLU/RSV plus assay is intended as an aid in the diagnosis of influenza from Nasopharyngeal swab  specimens and should not be used as a sole basis for treatment. Nasal washings and aspirates are unacceptable for Xpert Xpress SARS-CoV-2/FLU/RSV testing.  Fact Sheet for Patients: BloggerCourse.com  Fact Sheet for Healthcare Providers: SeriousBroker.it  This test is not yet approved or cleared by the [place] FDA and has been authorized for detection and/or diagnosis of SARS-CoV-2 by FDA under an Emergency Use Authorization (EUA). This EUA will remain in effect (meaning this test can be used) for the duration of the COVID-19 declaration under Section 564(b)(1) of the Act, 21 U.S.C. section 360bbb-3(b)(1), unless the authorization is terminated or revoked.     Resp Syncytial Virus by PCR NEGATIVE NEGATIVE Final    Comment: (NOTE) Fact Sheet for Patients: BloggerCourse.com  Fact Sheet for Healthcare Providers: SeriousBroker.it  This test is not yet approved or cleared by the [place]  [place] FDA and has been authorized for detection and/or diagnosis of SARS-CoV-2 by FDA under an Emergency Use Authorization (EUA). This EUA will remain in effect (meaning this test can be used) for the duration of the COVID-19 declaration under Section 564(b)(1) of the Act, 21 U.S.C. section 360bbb-3(b)(1), unless the authorization is terminated or revoked.  Performed at [facility] [place] [facility], [address] [place], [place] [address]       Radiology Studies: DG Chest 2 View Result Date: 10/18/2023 CLINICAL DATA:  [id number] Cough in adult [id number] EXAM: CHEST - 2 VIEW COMPARISON:  06/06/2023 FINDINGS: Heart size that is post sternotomy and CABG. Aortic atherosclerosis. Extensive coarsened interstitial markings compatible with none interstitial lung disease. No definite superimposed airspace opacity. No pleural effusion or pneumothorax. IMPRESSION: Chronic interstitial lung disease without  superimposed airspace opacity. Electronically Signed   By: Duanne Guess D.O.   On: 10/18/2023 15:42   DG Elbow Complete Right Result Date: 10/18/2023 CLINICAL DATA:  Skin infection EXAM: RIGHT ELBOW - COMPLETE 3+ VIEW COMPARISON:  None Available. FINDINGS: There is no evidence of fracture, dislocation, or joint effusion. No erosion or periosteal elevation. There is no evidence of arthropathy or other focal bone abnormality. Soft tissue swelling of the posterior elbow and proximal forearm. No soft tissue air. IMPRESSION: Soft tissue swelling of the posterior elbow and proximal forearm. No acute osseous abnormality. Electronically Signed   By: Duanne Guess D.O.   On: 10/18/2023 15:41       LOS: 1 day   [employer]  [facility] Pager on [email or web address]  10/19/2023, 10:23 AM

## 2023-10-20 ENCOUNTER — Inpatient Hospital Stay (HOSPITAL_COMMUNITY)

## 2023-10-20 DIAGNOSIS — I480 Paroxysmal atrial fibrillation: Secondary | ICD-10-CM | POA: Diagnosis not present

## 2023-10-20 DIAGNOSIS — R600 Localized edema: Secondary | ICD-10-CM | POA: Diagnosis not present

## 2023-10-20 DIAGNOSIS — U071 COVID-19: Secondary | ICD-10-CM | POA: Diagnosis not present

## 2023-10-20 DIAGNOSIS — J9601 Acute respiratory failure with hypoxia: Secondary | ICD-10-CM | POA: Diagnosis not present

## 2023-10-20 DIAGNOSIS — M7989 Other specified soft tissue disorders: Secondary | ICD-10-CM | POA: Diagnosis not present

## 2023-10-20 LAB — BASIC METABOLIC PANEL
Anion gap: 6 (ref 5–15)
BUN: 25 mg/dL — ABNORMAL HIGH (ref 8–23)
CO2: 32 mmol/L (ref 22–32)
Calcium: 8.7 mg/dL — ABNORMAL LOW (ref 8.9–10.3)
Chloride: 101 mmol/L (ref 98–111)
Creatinine, Ser: 0.69 mg/dL (ref 0.61–1.24)
GFR, Estimated: >60 mL/min (ref >60)
Glucose, Bld: 119 mg/dL — ABNORMAL HIGH (ref 70–99)
Potassium: 3.9 mmol/L (ref 3.5–5.1)
Sodium: 139 mmol/L (ref 135–145)

## 2023-10-20 LAB — CBC
HCT: 34.1 % — ABNORMAL LOW (ref 39.0–52.0)
Hemoglobin: 11.7 g/dL — ABNORMAL LOW (ref 13.0–17.0)
MCH: 34.2 pg — ABNORMAL HIGH (ref 26.0–34.0)
MCHC: 34.3 g/dL (ref 30.0–36.0)
MCV: 99.7 fL (ref 80.0–100.0)
Platelets: 236 10*3/uL (ref 150–400)
RBC: 3.42 MIL/uL — ABNORMAL LOW (ref 4.22–5.81)
RDW: 16.6 % — ABNORMAL HIGH (ref 11.5–15.5)
WBC: 7.8 10*3/uL (ref 4.0–10.5)
nRBC: 0.3 % — ABNORMAL HIGH (ref 0.0–0.2)

## 2023-10-20 LAB — GLUCOSE, CAPILLARY
Glucose-Capillary: 136 mg/dL — ABNORMAL HIGH (ref 70–99)
Glucose-Capillary: 220 mg/dL — ABNORMAL HIGH (ref 70–99)
Glucose-Capillary: 230 mg/dL — ABNORMAL HIGH (ref 70–99)
Glucose-Capillary: 377 mg/dL — ABNORMAL HIGH (ref 70–99)
Glucose-Capillary: 264 mg/dL — ABNORMAL HIGH (ref 70–99)

## 2023-10-20 LAB — C-REACTIVE PROTEIN: CRP: 0.6 mg/dL (ref <1.0)

## 2023-10-20 MED ORDER — INSULIN ASPART 100 UNIT/ML IJ SOLN
0.0000 [IU] | Freq: Three times a day (TID) | INTRAMUSCULAR | Status: DC
  Administered 2023-10-20: 5 [IU] via SUBCUTANEOUS
  Administered 2023-10-20 – 2023-10-21 (×3): 15 [IU] via SUBCUTANEOUS
  Administered 2023-10-22: 5 [IU] via SUBCUTANEOUS
  Administered 2023-10-22: 22 [IU] via SUBCUTANEOUS
  Administered 2023-10-22: 2 [IU] via SUBCUTANEOUS
  Administered 2023-10-23: 15 [IU] via SUBCUTANEOUS
  Administered 2023-10-23: 3 [IU] via SUBCUTANEOUS
  Administered 2023-10-23: 25 [IU] via SUBCUTANEOUS
  Administered 2023-10-24: 8 [IU] via SUBCUTANEOUS
  Administered 2023-10-24: 5 [IU] via SUBCUTANEOUS
  Administered 2023-10-24: 3 [IU] via SUBCUTANEOUS
  Administered 2023-10-25: 8 [IU] via SUBCUTANEOUS

## 2023-10-20 MED ORDER — METOPROLOL TARTRATE 5 MG/5ML IV SOLN
5.0000 mg | INTRAVENOUS | Status: DC | PRN
  Administered 2023-10-20: 5 mg via INTRAVENOUS
  Filled 2023-10-20 (×2): qty 5

## 2023-10-20 MED ORDER — POTASSIUM CHLORIDE 20 MEQ PO PACK
20.0000 meq | PACK | Freq: Once | ORAL | Status: AC
  Administered 2023-10-20: 20 meq via ORAL
  Filled 2023-10-20: qty 1

## 2023-10-20 MED ORDER — INSULIN GLARGINE 100 UNIT/ML ~~LOC~~ SOLN
16.0000 [IU] | Freq: Every day | SUBCUTANEOUS | Status: DC
  Administered 2023-10-20 – 2023-10-24 (×5): 16 [IU] via SUBCUTANEOUS
  Filled 2023-10-20 (×6): qty 0.16

## 2023-10-20 MED ORDER — FUROSEMIDE 10 MG/ML IJ SOLN
40.0000 mg | Freq: Every day | INTRAMUSCULAR | Status: DC
  Administered 2023-10-20 – 2023-10-21 (×2): 40 mg via INTRAVENOUS
  Filled 2023-10-20 (×2): qty 4

## 2023-10-20 MED ORDER — METOPROLOL TARTRATE 25 MG PO TABS
25.0000 mg | ORAL_TABLET | Freq: Three times a day (TID) | ORAL | Status: DC
  Administered 2023-10-20 – 2023-10-21 (×4): 25 mg via ORAL
  Filled 2023-10-20 (×4): qty 1

## 2023-10-20 MED ORDER — INSULIN GLARGINE 100 UNIT/ML ~~LOC~~ SOLN
12.0000 [IU] | Freq: Every day | SUBCUTANEOUS | Status: DC
  Filled 2023-10-20: qty 0.12

## 2023-10-20 NOTE — Progress Notes (Signed)
 VASCULAR LAB    Bilateral lower extremity venous duplex has been performed.  See CV proc for preliminary results.   Bairon Klemann, RVT 10/20/2023, 5:15 PM

## 2023-10-20 NOTE — Evaluation (Signed)
 Occupational Therapy Evaluation Patient Details Name: Todd [name] MRN: [id number] DOB: 11-29-1940 Today's Date: 10/20/2023   History of Present Illness   Pt is 83 yo male presented on 10/18/23 with progressive weakness and shortness of breath and worsening LE edema.  Pt found to bed COVID +.  Pt with hx including but not limited to A.fib on Xarelto, DM2, pulmonary fibrosis, HTN, HLD, DM2, CAD sp CABG     Clinical Impressions Pt admitted for above, PTA pt was using RW to ambulate and reports being independent with toileting, showers and UB ADLs, wife assists with LB ADLs. Pt currently presenting with decreased activity tolerance and needs CGA to stand with RW; limited by elevated HR this session. Pt needing Total to setup A for ADLs, more assist needed with LB ADLs. He is tolerable to exercises with periods of rest breaks, needs repetetitive cues to use BUEs to push off bed to help stand. OT to continue following pt acutely to address listed deficits and help transition to next level of care. Patient would benefit from post acute Home OT services to help maximize functional independence in natural environment      If plan is discharge home, recommend the following:   A little help with walking and/or transfers;Assistance with cooking/housework;A lot of help with bathing/dressing/bathroom     Functional Status Assessment   Patient has had a recent decline in their functional status and demonstrates the ability to make significant improvements in function in a reasonable and predictable amount of time.     Equipment Recommendations   BSC/3in1     Recommendations for Other Services         Precautions/Restrictions   Precautions Precautions: Fall Recall of Precautions/Restrictions: Intact Restrictions Weight Bearing Restrictions Per Provider Order: No     Mobility Bed Mobility Overal bed mobility: Needs Assistance Bed Mobility: Supine to Sit, Sit to Supine     Supine  to sit: Min assist Sit to supine: Contact guard assist, Used rails   General bed mobility comments: Min A to raise trunk, increased time needed to return BLEs to bed    Transfers Overall transfer level: Needs assistance Equipment used: Rolling walker (2 wheels) Transfers: Sit to/from Stand Sit to Stand: Min assist           General transfer comment: Initially Min A with first STS, reinforcement needed for BUE use to push off bed. Next 2 stands were CGA. Lateral steps EOB with CGA      Balance Overall balance assessment: Needs assistance Sitting-balance support: No upper extremity supported Sitting balance-Leahy Scale: Good       Standing balance-Leahy Scale: Fair                             ADL either performed or assessed with clinical judgement   ADL Overall ADL's : Needs assistance/impaired Eating/Feeding: Independent;Sitting;Bed level   Grooming: Sitting;Set up   Upper Body Bathing: Set up;Sitting   Lower Body Bathing: Moderate assistance;Sitting/lateral leans   Upper Body Dressing : Sitting;Contact guard assist   Lower Body Dressing: Sitting/lateral leans;Total assistance Lower Body Dressing Details (indicate cue type and reason): to doff/don socks Toilet Transfer: Squat-pivot;Rolling walker (2 wheels);Contact guard assist   Toileting- Clothing Manipulation and Hygiene: Sit to/from stand;Contact guard assist         General ADL Comments: Pt with elevated HR and not safe for room ambulation at this time     Vision  Perception         Praxis         Pertinent Vitals/Pain Pain Assessment Pain Assessment: Faces Faces Pain Scale: No hurt     Extremity/Trunk Assessment Upper Extremity Assessment Upper Extremity Assessment: Overall WFL for tasks assessed   Lower Extremity Assessment RLE Deficits / Details: ROM WFL; MMT 4/5; edema + but family reports significant improvement LLE Deficits / Details: ROM WFL; MMT 4/5; edema +  but family reports significant improvement   Cervical / Trunk Assessment Cervical / Trunk Assessment: Normal   Communication Communication Communication: Impaired Factors Affecting Communication: Hearing impaired   Cognition Arousal: Alert Behavior During Therapy: WFL for tasks assessed/performed Cognition: No apparent impairments                               Following commands: Intact       Cueing  General Comments          Exercises General Exercises - Lower Extremity Long Arc Quad: AROM, Both, 10 reps, Limitations Long Arc Quad Limitations: limited height of arc Hip Flexion/Marching: AROM, Standing, 10 reps Heel Raises: AROM, Both, 10 reps, Seated   Shoulder Instructions      Home Living Family/patient expects to be discharged to:: Private residence Living Arrangements: Spouse/significant other Available Help at Discharge: Family;Available 24 hours/day Type of Home: House Home Access: Ramped entrance     Home Layout: One level     Bathroom Shower/Tub: Producer, television/film/video: Handicapped height     Home Equipment: Agricultural consultant (2 wheels);Rollator (4 wheels)   Additional Comments: counter is next to toilet      Prior Functioning/Environment               Mobility Comments: Uses RW at night to go to bathroom but otherwise no AD; Limited community ambulatory; reports "dizzy and wobble but doesn't fall" ADLs Comments: Pt independent with toielting and showers; wife assist with lower body dressing (weak LE baseline)    OT Problem List: Decreased activity tolerance;Impaired balance (sitting and/or standing);Cardiopulmonary status limiting activity;Increased edema   OT Treatment/Interventions: Self-care/ADL training;Patient/family education;Therapeutic exercise;Balance training;Energy conservation;Therapeutic activities;DME and/or AE instruction      OT Goals(Current goals can be found in the care plan section)   Acute  Rehab OT Goals Patient Stated Goal: To go home, get better OT Goal Formulation: With patient/family Time For Goal Achievement: 11/03/23 Potential to Achieve Goals: Good ADL Goals Pt Will Perform Grooming: standing;with contact guard assist Pt Will Transfer to Toilet: with contact guard assist;ambulating Pt/caregiver will Perform Home Exercise Program: Increased strength;Both right and left upper extremity;With theraband;Independently;With written HEP provided Additional ADL Goal #2: Pt will demonstrate independent use of energy conservation strategies prn in functional activities.   OT Frequency:  Min 2X/week    Co-evaluation              AM-PAC OT "6 Clicks" Daily Activity     Outcome Measure Help from another person eating meals?: None Help from another person taking care of personal grooming?: A Little Help from another person toileting, which includes using toliet, bedpan, or urinal?: A Little Help from another person bathing (including washing, rinsing, drying)?: A Lot Help from another person to put on and taking off regular upper body clothing?: A Little Help from another person to put on and taking off regular lower body clothing?: Total 6 Click Score: 16   End of Session  Equipment Utilized During Treatment: Gait belt;Rolling walker (2 wheels) Nurse Communication: Mobility status  Activity Tolerance: Patient tolerated treatment well Patient left: in bed;with call bell/phone within reach;with bed alarm set;with family/visitor present  OT Visit Diagnosis: Unsteadiness on feet (R26.81);Other abnormalities of gait and mobility (R26.89);Other (comment) (SOB)                Time: 5409-8119 OT Time Calculation (min): 44 min Charges:  OT General Charges $OT Visit: 1 Visit OT Evaluation $OT Eval Moderate Complexity: 1 Mod OT Treatments $Therapeutic Activity: 8-22 mins $Therapeutic Exercise: 8-22 mins  10/20/2023  AB, OTR/L  Acute Rehabilitation Services  Office:  [phone]   Tristan Schroeder 10/20/2023, 4:24 PM

## 2023-10-20 NOTE — Progress Notes (Addendum)
 PROGRESS NOTE        PATIENT DETAILS Name: Todd [name] Age: 83 y.o. Sex: male Date of Birth: [birth year] Admit Date: 10/18/2023 Admitting Physician Therisa Doyne, MD ZOX:[id number], [name], MD  Brief Summary: Patient is a 83 y.o.  male with history of pulmonary fibrosis, PAF, HTN, HLD, DM-2, CAD s/p CABG who presented with progressive weakness and shortness of breath-he had an episode of choking in the emergency room-he was subsequently found to have COVID-19 infection and subsequently admitted to the hospitalist service.  See below for further details.  Significant events: 2/27>> admit to [facility] 3/01>> A-fib RVR  Significant studies: 2/27>> CXR: Coarse interstitial lung disease  2/28>> echo: EF 45%, global hypokinesis.  Significant microbiology data: 2/27>> COVID PCR: Positive 2/27>> influenza A/influenza B/RSV PCR: Negative  Procedures: None  Consults: None  Subjective: Feels weak-in RVR this morning-2+ pitting edema.  No other complaints.  Objective: Vitals: Blood pressure (!) 112/97, pulse (!) 128, temperature 97.9 F (36.6 C), resp. rate (!) 34, height 5\' 6"  (1.676 m), weight 63.6 kg, SpO2 95%.   Exam: Gen Exam:Alert awake-not in any distress HEENT:atraumatic, normocephalic Chest: Few bibasilar rales CVS:S1S2 regular Abdomen:soft non tender, non distended Extremities:++ edema Neurology: Non focal Skin: no rash  Pertinent Labs/Radiology:    Latest Ref Rng & Units 10/20/2023    5:26 AM 10/19/2023    4:32 AM 10/18/2023   11:41 AM  CBC  WBC 4.0 - 10.5 K/uL 7.8  10.3  7.2   Hemoglobin 13.0 - 17.0 g/dL 54.0  98.1  19.1   Hematocrit 39.0 - 52.0 % 34.1  34.4  35.1   Platelets 150 - 400 K/uL 236  262  268     Lab Results  Component Value Date   NA 139 10/20/2023   K 3.9 10/20/2023   CL 101 10/20/2023   CO2 32 10/20/2023      Assessment/Plan: PAF with RVR In RVR this morning IV metoprolol x 1 Resume oral beta-blocker Telemetry  monitoring Remains on Xarelto.  HFmrEF exacerbation Significant lower extremity edema (Dopplers pending but already on Xarelto) Probably exacerbated by COVID/RVR IV Lasix Metoprolol Depending on BP-clinical response-will add other GDMT medications. Monitor weight/intake/output closely.  COVID-19 infection Supportive care at this point, already on prednisone Minimal to no hypoxia-does not require antivirals  Interstitial lung disease  History of asbestos exposure Check CRP Already on steroids IV Lasix-attempt to keep in negative balance Follows with Dr. Isaiah Serge Assess for home O2 requirement prior to discharge.  Minimally elevated troponins Likely demand ischemia. No chest pain Echo with mildly reduced EF and global hypokinesis  CAD s/p CABG No chest pain Continue statin/beta-blocker Suspect not on ASA as patient on anticoagulation.  DM-2 (A1c 12.7 on 2/27) with uncontrolled hyperglycemia CBGs uncontrolled Increase Lantus to 12 units-change SSI to moderate scale Follow/optimize.  Recent Labs    10/19/23 2305 10/20/23 0419 10/20/23 0740  GLUCAP 277* 136* 220*    Oropharyngeal dysphagia Choking episode in the ED on initial presentation Evaluated by SLP-dysphagia 3 diet started which she seems to be tolerating.  Debility/deconditioning PT/OT eval-Home health recommended.  BMI: Estimated body mass index is 22.63 kg/m as calculated from the following:   Height as of this encounter: 5\' 6"  (1.676 m).   Weight as of this encounter: 63.6 kg.   Code status:   Code Status: Full Code  DVT Prophylaxis: SCDs Start: 10/19/23 0303 Place TED hose Start: 10/18/23 2057 rivaroxaban (XARELTO) tablet 20 mg     Family Communication: Daughter/spouse at bedside   Disposition Plan: Status is: Inpatient Remains inpatient appropriate because: Severity of illness   Planned Discharge Destination:Home health   Diet: Diet Order             DIET DYS 3 Room service  appropriate? Yes with Assist; Fluid consistency: Thin  Diet effective now                     Antimicrobial agents: Anti-infectives (From admission, onward)    None        MEDICATIONS: Scheduled Meds:  atorvastatin  80 mg Oral QHS   furosemide  40 mg Intravenous Daily   guaiFENesin  600 mg Oral BID   insulin aspart  0-15 Units Subcutaneous TID WC   insulin glargine  12 Units Subcutaneous QHS   metoprolol tartrate  25 mg Oral Q8H   predniSONE  10 mg Oral Q breakfast   rivaroxaban  20 mg Oral Q supper   sodium chloride flush  3 mL Intravenous Q12H   Continuous Infusions:   PRN Meds:.acetaminophen **OR** acetaminophen, albuterol, HYDROcodone-acetaminophen, metoprolol tartrate, ondansetron **OR** ondansetron (ZOFRAN) IV, sodium chloride flush   I have personally reviewed following labs and imaging studies  LABORATORY DATA: CBC: Recent Labs  Lab 10/18/23 1141 10/19/23 0432 10/20/23 0526  WBC 7.2 10.3 7.8  NEUTROABS 6.8  --   --   HGB 12.0* 12.1* 11.7*  HCT 35.1* 34.4* 34.1*  MCV 100.0 98.0 99.7  PLT 268 262 236    Basic Metabolic Panel: Recent Labs  Lab 10/18/23 1141 10/18/23 2349 10/19/23 0432 10/19/23 2056 10/20/23 0526  NA 133* 135 140  --  139  K 4.4 2.9* 3.9  --  3.9  CL 96* 98 99  --  101  CO2 29 25 29   --  32  GLUCOSE 507* 324* 66* 415* 119*  BUN 34* 31* 29*  --  25*  CREATININE 0.98 0.83 0.74  --  0.69  CALCIUM 8.9 7.9* 9.2  --  8.7*  MG  --  1.4* 2.9*  --   --   PHOS  --  2.9 3.2  --   --     GFR: Estimated Creatinine Clearance: 66.3 mL/min (by C-G formula based on SCr of 0.69 mg/dL).  Liver Function Tests: Recent Labs  Lab 10/18/23 1141 10/19/23 0432  AST 38 34  ALT 51* 45*  ALKPHOS 87 83  BILITOT 0.8 0.7  PROT 6.6 6.5  ALBUMIN 2.3* 2.2*   No results for input(s): "LIPASE", "AMYLASE" in the last 168 hours. No results for input(s): "AMMONIA" in the last 168 hours.  Coagulation Profile: No results for input(s): "INR",  "PROTIME" in the last 168 hours.  Cardiac Enzymes: Recent Labs  Lab 10/18/23 2349  CKTOTAL 82    BNP (last 3 results) No results for input(s): "PROBNP" in the last 8760 hours.  Lipid Profile: No results for input(s): "CHOL", "HDL", "LDLCALC", "TRIG", "CHOLHDL", "LDLDIRECT" in the last 72 hours.  Thyroid Function Tests: No results for input(s): "TSH", "T4TOTAL", "FREET4", "T3FREE", "THYROIDAB" in the last 72 hours.  Anemia Panel: No results for input(s): "VITAMINB12", "FOLATE", "FERRITIN", "TIBC", "IRON", "RETICCTPCT" in the last 72 hours.  Urine analysis:    Component Value Date/Time   COLORURINE STRAW (A) 10/18/2023 1900   APPEARANCEUR CLEAR 10/18/2023 1900   LABSPEC 1.009 10/18/2023 1900  PHURINE 5.0 10/18/2023 1900   GLUCOSEU >=500 (A) 10/18/2023 1900   HGBUR SMALL (A) 10/18/2023 1900   HGBUR negative 07/20/2008 1053   BILIRUBINUR NEGATIVE 10/18/2023 1900   KETONESUR NEGATIVE 10/18/2023 1900   PROTEINUR NEGATIVE 10/18/2023 1900   UROBILINOGEN negative 07/20/2008 1053   NITRITE NEGATIVE 10/18/2023 1900   LEUKOCYTESUR NEGATIVE 10/18/2023 1900    Sepsis Labs: Lactic Acid, Venous No results found for: "LATICACIDVEN"  MICROBIOLOGY: Recent Results (from the past 240 hours)  Resp panel by RT-PCR (RSV, Flu A&B, Covid) Anterior Nasal Swab     Status: Abnormal   Collection Time: 10/18/23  8:56 PM   Specimen: Anterior Nasal Swab  Result Value Ref Range Status   SARS Coronavirus 2 by RT PCR POSITIVE (A) NEGATIVE Final   Influenza A by PCR NEGATIVE NEGATIVE Final   Influenza B by PCR NEGATIVE NEGATIVE Final    Comment: (NOTE) The Xpert Xpress SARS-CoV-2/FLU/RSV plus assay is intended as an aid in the diagnosis of influenza from Nasopharyngeal swab specimens and should not be used as a sole basis for treatment. Nasal washings and aspirates are unacceptable for Xpert Xpress SARS-CoV-2/FLU/RSV testing.  Fact Sheet for  Patients: BloggerCourse.com  Fact Sheet for Healthcare Providers: SeriousBroker.it  This test is not yet approved or cleared by the [place] FDA and has been authorized for detection and/or diagnosis of SARS-CoV-2 by FDA under an Emergency Use Authorization (EUA). This EUA will remain in effect (meaning this test can be used) for the duration of the COVID-19 declaration under Section 564(b)(1) of the Act, 21 U.S.C. section 360bbb-3(b)(1), unless the authorization is terminated or revoked.     Resp Syncytial Virus by PCR NEGATIVE NEGATIVE Final    Comment: (NOTE) Fact Sheet for Patients: BloggerCourse.com  Fact Sheet for Healthcare Providers: SeriousBroker.it  This test is not yet approved or cleared by the [place] FDA and has been authorized for detection and/or diagnosis of SARS-CoV-2 by FDA under an Emergency Use Authorization (EUA). This EUA will remain in effect (meaning this test can be used) for the duration of the COVID-19 declaration under Section 564(b)(1) of the Act, 21 U.S.C. section 360bbb-3(b)(1), unless the authorization is terminated or revoked.  Performed at [facility], [address] [place], [place] [address]     RADIOLOGY STUDIES/RESULTS: ECHOCARDIOGRAM COMPLETE Result Date: 10/19/2023    ECHOCARDIOGRAM REPORT   Patient Name:   CANDON CARAS Snowberger Date of Exam: 10/19/2023 Medical Rec #:  [id number]     Height:       66.0 in Accession #:    [id number]    Weight:       138.0 lb Date of Birth:  June 30, 1941      BSA:          1.708 m Patient Age:    82 years      BP:           100/50 mmHg Patient Gender: M             HR:           100 bpm. Exam Location:  Inpatient Procedure: 2D Echo (Both Spectral and Color Flow Doppler were utilized during            procedure). Indications:    R06.9 DOE  History:        Patient has prior history of Echocardiogram  examinations, most                 recent 01/08/2023.  CAD, PAD and Carotid Disease,                 Signs/Symptoms:Edema; Risk Factors:Dyslipidemia, Diabetes,                 Hypertension and Non-Smoker.  Sonographer:    Dondra Prader RVT Referring Phys: [id number] ANASTASSIA DOUTOVA  Sonographer Comments: Patient coughing throughout exam. HR between 90 bpm - 125 bpm IMPRESSIONS  1. Left ventricular ejection fraction, by estimation, is 45%. The left ventricle has mildly decreased function. The left ventricle demonstrates global hypokinesis. There is mild concentric left ventricular hypertrophy. Left ventricular diastolic parameters are indeterminate.  2. Right ventricular systolic function is mildly reduced. The right ventricular size is mildly enlarged. There is normal pulmonary artery systolic pressure. The estimated right ventricular systolic pressure is 33.3 mmHg.  3. Left atrial size was mildly dilated.  4. The mitral valve is degenerative. Mild mitral valve regurgitation. No evidence of mitral stenosis. Moderate mitral annular calcification.  5. The aortic valve is tricuspid. There is moderate calcification of the aortic valve. Aortic valve regurgitation is not visualized. Aortic valve sclerosis/calcification is present, without any evidence of aortic stenosis.  6. The inferior vena cava is dilated in size with <50% respiratory variability, suggesting right atrial pressure of 15 mmHg.  7. The patient was in atrial fibrillation. FINDINGS  Left Ventricle: Left ventricular ejection fraction, by estimation, is 45%. The left ventricle has mildly decreased function. The left ventricle demonstrates global hypokinesis. The left ventricular internal cavity size was normal in size. There is mild concentric left ventricular hypertrophy. Left ventricular diastolic parameters are indeterminate. Right Ventricle: The right ventricular size is mildly enlarged. No increase in right ventricular wall thickness. Right ventricular systolic  function is mildly reduced. There is normal pulmonary artery systolic pressure. The tricuspid regurgitant velocity  is 2.14 m/s, and with an assumed right atrial pressure of 15 mmHg, the estimated right ventricular systolic pressure is 33.3 mmHg. Left Atrium: Left atrial size was mildly dilated. Right Atrium: Right atrial size was normal in size. Pericardium: There is no evidence of pericardial effusion. Mitral Valve: The mitral valve is degenerative in appearance. Moderate mitral annular calcification. Mild mitral valve regurgitation. No evidence of mitral valve stenosis. Tricuspid Valve: The tricuspid valve is normal in structure. Tricuspid valve regurgitation is mild. Aortic Valve: The aortic valve is tricuspid. There is moderate calcification of the aortic valve. Aortic valve regurgitation is not visualized. Aortic valve sclerosis/calcification is present, without any evidence of aortic stenosis. Aortic valve mean gradient measures 2.3 mmHg. Aortic valve peak gradient measures 3.9 mmHg. Aortic valve area, by VTI measures 1.61 cm. Pulmonic Valve: The pulmonic valve was normal in structure. Pulmonic valve regurgitation is not visualized. Aorta: The aortic root is normal in size and structure. Venous: The inferior vena cava is dilated in size with less than 50% respiratory variability, suggesting right atrial pressure of 15 mmHg. IAS/Shunts: No atrial level shunt detected by color flow Doppler.  LEFT VENTRICLE PLAX 2D LVIDd:         3.10 cm LVIDs:         2.60 cm LV PW:         1.00 cm LV IVS:        1.50 cm LVOT diam:     2.10 cm LV SV:         29 LV SV Index:   17 LVOT Area:     3.46 cm  RIGHT VENTRICLE  IVC RV Basal diam:  3.30 cm    IVC diam: 2.10 cm RV S prime:     8.81 cm/s TAPSE (M-mode): 1.5 cm LEFT ATRIUM             Index        RIGHT ATRIUM           Index LA Vol (A2C):   47.4 ml 27.75 ml/m  RA Area:     14.20 cm LA Vol (A4C):   46.5 ml 27.22 ml/m  RA Volume:   32.10 ml  18.79 ml/m LA  Biplane Vol: 49.8 ml 29.16 ml/m  AORTIC VALVE                    PULMONIC VALVE AV Area (Vmax):    1.75 cm     PV Vmax:       0.74 m/s AV Area (Vmean):   1.69 cm     PV Peak grad:  2.2 mmHg AV Area (VTI):     1.61 cm AV Vmax:           98.60 cm/s AV Vmean:          68.067 cm/s AV VTI:            0.183 m AV Peak Grad:      3.9 mmHg AV Mean Grad:      2.3 mmHg LVOT Vmax:         49.75 cm/s LVOT Vmean:        33.175 cm/s LVOT VTI:          0.085 m LVOT/AV VTI ratio: 0.46  AORTA Ao Root diam: 3.10 cm Ao Asc diam:  3.50 cm MITRAL VALVE                TRICUSPID VALVE MV Area (PHT): 4.39 cm     TR Peak grad:   18.3 mmHg MV Decel Time: 173 msec     TR Vmax:        214.00 cm/s MV E velocity: 116.00 cm/s MV A velocity: 39.10 cm/s   SHUNTS MV E/A ratio:  2.97         Systemic VTI:  0.08 m                             Systemic Diam: 2.10 cm Dalton McleanMD Electronically signed by Wilfred Lacy Signature Date/Time: 10/19/2023/12:11:19 PM    Final    DG Chest 2 View Result Date: 10/18/2023 CLINICAL DATA:  [id number] Cough in adult [id number] EXAM: CHEST - 2 VIEW COMPARISON:  06/06/2023 FINDINGS: Heart size that is post sternotomy and CABG. Aortic atherosclerosis. Extensive coarsened interstitial markings compatible with none interstitial lung disease. No definite superimposed airspace opacity. No pleural effusion or pneumothorax. IMPRESSION: Chronic interstitial lung disease without superimposed airspace opacity. Electronically Signed   By: Duanne Guess D.O.   On: 10/18/2023 15:42   DG Elbow Complete Right Result Date: 10/18/2023 CLINICAL DATA:  Skin infection EXAM: RIGHT ELBOW - COMPLETE 3+ VIEW COMPARISON:  None Available. FINDINGS: There is no evidence of fracture, dislocation, or joint effusion. No erosion or periosteal elevation. There is no evidence of arthropathy or other focal bone abnormality. Soft tissue swelling of the posterior elbow and proximal forearm. No soft tissue air. IMPRESSION: Soft tissue swelling  of the posterior elbow and proximal forearm. No acute osseous abnormality. Electronically Signed   By: Duanne Guess D.O.  On: 10/18/2023 15:41     LOS: 2 days   Jeoffrey Massed, MD  [facility]    To contact the attending provider between 7A-7P or the covering provider during after hours 7P-7A, please log into the web site [email or web address] and access using universal [place] password for that web site. If you do not have the password, please call the hospital operator.  10/20/2023, 9:39 AM

## 2023-10-21 DIAGNOSIS — I480 Paroxysmal atrial fibrillation: Secondary | ICD-10-CM | POA: Diagnosis not present

## 2023-10-21 DIAGNOSIS — R600 Localized edema: Secondary | ICD-10-CM | POA: Diagnosis not present

## 2023-10-21 DIAGNOSIS — U071 COVID-19: Secondary | ICD-10-CM | POA: Diagnosis not present

## 2023-10-21 DIAGNOSIS — J9601 Acute respiratory failure with hypoxia: Secondary | ICD-10-CM | POA: Diagnosis not present

## 2023-10-21 LAB — BASIC METABOLIC PANEL
Anion gap: 15 (ref 5–15)
BUN: 23 mg/dL (ref 8–23)
CO2: 29 mmol/L (ref 22–32)
Calcium: 9.8 mg/dL (ref 8.9–10.3)
Chloride: 97 mmol/L — ABNORMAL LOW (ref 98–111)
Creatinine, Ser: 0.62 mg/dL (ref 0.61–1.24)
GFR, Estimated: >60 mL/min (ref >60)
Glucose, Bld: 119 mg/dL — ABNORMAL HIGH (ref 70–99)
Potassium: 4 mmol/L (ref 3.5–5.1)
Sodium: 141 mmol/L (ref 135–145)

## 2023-10-21 LAB — CBC
HCT: 35.7 % — ABNORMAL LOW (ref 39.0–52.0)
Hemoglobin: 12.2 g/dL — ABNORMAL LOW (ref 13.0–17.0)
MCH: 34.3 pg — ABNORMAL HIGH (ref 26.0–34.0)
MCHC: 34.2 g/dL (ref 30.0–36.0)
MCV: 100.3 fL — ABNORMAL HIGH (ref 80.0–100.0)
Platelets: 252 10*3/uL (ref 150–400)
RBC: 3.56 MIL/uL — ABNORMAL LOW (ref 4.22–5.81)
RDW: 16.6 % — ABNORMAL HIGH (ref 11.5–15.5)
WBC: 8.4 10*3/uL (ref 4.0–10.5)
nRBC: 0 % (ref 0.0–0.2)

## 2023-10-21 LAB — C-REACTIVE PROTEIN: CRP: 0.7 mg/dL (ref <1.0)

## 2023-10-21 LAB — MAGNESIUM: Magnesium: 1.7 mg/dL (ref 1.7–2.4)

## 2023-10-21 LAB — GLUCOSE, CAPILLARY
Glucose-Capillary: 101 mg/dL — ABNORMAL HIGH (ref 70–99)
Glucose-Capillary: 127 mg/dL — ABNORMAL HIGH (ref 70–99)
Glucose-Capillary: 190 mg/dL — ABNORMAL HIGH (ref 70–99)
Glucose-Capillary: 312 mg/dL — ABNORMAL HIGH (ref 70–99)
Glucose-Capillary: 333 mg/dL — ABNORMAL HIGH (ref 70–99)
Glucose-Capillary: 384 mg/dL — ABNORMAL HIGH (ref 70–99)

## 2023-10-21 MED ORDER — METOPROLOL TARTRATE 50 MG PO TABS
50.0000 mg | ORAL_TABLET | Freq: Two times a day (BID) | ORAL | Status: DC
  Administered 2023-10-21 (×2): 50 mg via ORAL
  Filled 2023-10-21 (×2): qty 1

## 2023-10-21 MED ORDER — ENSURE ENLIVE PO LIQD
237.0000 mL | Freq: Two times a day (BID) | ORAL | Status: DC
  Administered 2023-10-21: 237 mL via ORAL

## 2023-10-21 MED ORDER — MAGNESIUM SULFATE 2 GM/50ML IV SOLN
2.0000 g | Freq: Once | INTRAVENOUS | Status: AC
  Administered 2023-10-21: 2 g via INTRAVENOUS
  Filled 2023-10-21: qty 50

## 2023-10-21 MED ORDER — ADULT MULTIVITAMIN W/MINERALS CH
1.0000 | ORAL_TABLET | Freq: Every day | ORAL | Status: DC
  Administered 2023-10-21 – 2023-10-25 (×5): 1 via ORAL
  Filled 2023-10-21 (×5): qty 1

## 2023-10-21 MED ORDER — GLUCERNA SHAKE PO LIQD
237.0000 mL | Freq: Three times a day (TID) | ORAL | Status: DC
  Administered 2023-10-21 – 2023-10-25 (×10): 237 mL via ORAL

## 2023-10-21 MED ORDER — METOPROLOL TARTRATE 50 MG PO TABS: 50.0000 mg | ORAL_TABLET | Freq: Two times a day (BID) | ORAL | Status: DC

## 2023-10-21 NOTE — Assessment & Plan Note (Signed)
 Chronic Following with cardiology To have Ct scan for possible watchman - not sure if he is eligible or not - at this point he has so many other problems that seems more of a priority at this point - will hold off on Ct scan and he will be going to the ED

## 2023-10-21 NOTE — Assessment & Plan Note (Signed)
 Chronic Has worsened Has seen neuro To have MRI brain, c-spine and l-spine Likely multifactorial

## 2023-10-21 NOTE — Assessment & Plan Note (Signed)
 Chronic Blood pressure controlled No change in medications Continue metoprolol 100 mg twice daily

## 2023-10-21 NOTE — Progress Notes (Addendum)
 Initial Nutrition Assessment  DOCUMENTATION CODES:   Not applicable  INTERVENTION:  Glucerna Shake po TID, each supplement provides 220 kcal and 10 grams of protein  Multivitamin/minerals   NUTRITION DIAGNOSIS:   Increased nutrient needs related to acute illness as evidenced by estimated needs.    GOAL:   Patient will meet greater than or equal to 90% of their needs    MONITOR:   PO intake  REASON FOR ASSESSMENT:   Consult Assessment of nutrition requirement/status  ASSESSMENT:  83 year old male history of peripheral vascular disease, CAD, atrial fibrillation currently on Xarelto, diabetes, hypertension accompanied by daughter to the ER with concerns of weakness. Pt was instructed by PCP to come to the ED on 2/27 for bilateral weakness in his legs that has been progressively worsening since 2020 but has now worsened to limit activities of daily living and cause stumbling since Dec 2024. Family reports general health has worsened since 2020 when patient had COVID including decreased appetite, weight loss (~80lbs), chronic cough, and weakness presenting for today. In addition to leg weakness, patient reports pain in right elbow after feeling a "pop" upon waking two weeks ago; sacral ulcer (stage 2); swelling of the lower legs; intermittent SOB; and intermittent periumbilical pain. Patient denies nausea/vomiting/fever. Patient denies current chest pain or SOB.  Unable to visit with patient on this day all information obtained through EMR, and Team. EMR revealed; COVID positive, Declined oral intake with with weight loss over the last 4-5 years.  Last BM reported PTA.  Significant events: 2/27>> admit to [facility] 3/01>> A-fib RVR   Significant studies: 2/27>> CXR: Coarse interstitial lung disease  2/28>> echo: EF 45%, global hypokinesis.   Significant microbiology data: 2/27>> COVID PCR: Positive 2/27>> influenza A/influenza B/RSV PCR: Negative Admit weight: 62.6 kg Weight  history: 10/20/23 63.6 kg  10/18/23 62.6 kg  10/05/23 65.1 kg  09/26/23 64.4 kg  09/19/23 64.4 kg  08/01/23 65.8 kg  07/09/23 66.2 kg  07/05/23 67.1 kg  06/06/23 67.1 kg  04/27/23 69 kg      Average Meal Intake: No current documentation.   Nutritionally Relevant Medications: Scheduled Meds:  atorvastatin  80 mg Oral QHS   furosemide  40 mg Intravenous Daily   guaiFENesin  600 mg Oral BID   insulin aspart  0-15 Units Subcutaneous TID WC   insulin glargine  16 Units Subcutaneous QHS    Continuous Infusions:  magnesium sulfate bolus IVPB 2 g (10/21/23 0814)    Labs Reviewed: HgbA1c 12.7    NUTRITION - FOCUSED PHYSICAL EXAM:  Deferred   Diet Order:   Diet Order             DIET DYS 3 Room service appropriate? Yes with Assist; Fluid consistency: Thin  Diet effective now                   EDUCATION NEEDS:   Not appropriate for education at this time  Skin:  Skin Assessment: Reviewed RN Assessment  Last BM:  PTA  Height:   Ht Readings from Last 1 Encounters:  10/19/23 5\' 6"  (1.676 m)    Weight:   Wt Readings from Last 1 Encounters:  10/20/23 63.6 kg    Ideal Body Weight:     BMI:  Body mass index is 22.63 kg/m.  Estimated Nutritional Needs:   Kcal:  1910-2230 kcal  Protein:  85-105 g  Fluid:  68ml/kcal    Jamelle Haring RDN, LDN Clinical Dietitian   If unable to  reach, please contact "[facility]" secure chat group between 8 am-4 pm daily"

## 2023-10-21 NOTE — Assessment & Plan Note (Signed)
 Chronic Uncontrolled Has been on steroids Recently started on insulin - needs to be titrated, but given other medical problems will be evaluated in the ED

## 2023-10-21 NOTE — Assessment & Plan Note (Signed)
 Chronic Significant  Getting worse Likely multifactorial  Uncontrolled DM is likely contributing but likely several other causes

## 2023-10-21 NOTE — Assessment & Plan Note (Signed)
 Chronic Continues to lose weight Labs have been normal No appetite, gets full quickly Concern about underlying cancer or other cause

## 2023-10-21 NOTE — Progress Notes (Addendum)
 PROGRESS NOTE        PATIENT DETAILS Name: Todd [name] Age: 83 y.o. Sex: male Date of Birth: May 19, 1941 Admit Date: 10/18/2023 Admitting Physician Therisa Doyne, MD WGN:[id number], [name], MD  Brief Summary: Patient is a 83 y.o.  male with history of pulmonary fibrosis, PAF, HTN, HLD, DM-2, CAD s/p CABG who presented with progressive weakness and shortness of breath-he had an episode of choking in the emergency room-he was subsequently found to have COVID-19 infection and subsequently admitted to the hospitalist service.  See below for further details.  Significant events: 2/27>> admit to [facility] 3/01>> A-fib RVR  Significant studies: 2/27>> CXR: Coarse interstitial lung disease  2/28>> echo: EF 45%, global hypokinesis.  Significant microbiology data: 2/27>> COVID PCR: Positive 2/27>> influenza A/influenza B/RSV PCR: Negative  Procedures: None  Consults: None  Subjective: Feels better-continues to have intermittent episodes of RVR-decreasing leg swelling.  Objective: Vitals: Blood pressure 106/82, pulse (!) 109, temperature 97.7 F (36.5 C), temperature source Oral, resp. rate (!) 34, height 5\' 6"  (1.676 m), weight 63.6 kg, SpO2 92%.   Exam: Gen Exam:Alert awake-not in any distress HEENT:atraumatic, normocephalic Chest: B/L clear to auscultation anteriorly CVS:S1S2 irregular Abdomen:soft non tender, non distended Extremities:+ edema Neurology: Non focal Skin: no rash  Pertinent Labs/Radiology:    Latest Ref Rng & Units 10/21/2023    5:34 AM 10/20/2023    5:26 AM 10/19/2023    4:32 AM  CBC  WBC 4.0 - 10.5 K/uL 8.4  7.8  10.3   Hemoglobin 13.0 - 17.0 g/dL 08.6  57.8  46.9   Hematocrit 39.0 - 52.0 % 35.7  34.1  34.4   Platelets 150 - 400 K/uL 252  236  262     Lab Results  Component Value Date   NA 141 10/21/2023   K 4.0 10/21/2023   CL 97 (L) 10/21/2023   CO2 29 10/21/2023      Assessment/Plan: PAF with RVR Rate better  controlled-still intermittent RVR overnight Change metoprolol to 50 mg twice daily Continue Xarelto Telemetry monitoring.   HFmrEF exacerbation Significant improvement in lower extremity edema IV Lasix/metoprolol Follow weights/intake output Slowly add other GDMT medications over the next several days.   COVID-19 infection Supportive care at this point, already on prednisone Minimal to no hypoxia-does not require antivirals  Interstitial lung disease  History of asbestos exposure CRP not elevated Continue prednisone IV Lasix-attempt to keep in negative balance Follows with Dr. Isaiah Serge Assess for home O2 requirement prior to discharge.  Minimally elevated troponins Likely demand ischemia. No chest pain Echo with mildly reduced EF and global hypokinesis Will touch base with cardiology once he is a bit more euvolemic to see if further workup is required.  CAD s/p CABG No chest pain Continue statin/beta-blocker Suspect not on ASA as patient on anticoagulation.  DM-2 (A1c 12.7 on 2/27) with uncontrolled hyperglycemia CBGs slowly improving Continue Lantus 16 units p+ moderate SSI.    Recent Labs    10/21/23 0520 10/21/23 0721 10/21/23 1110  GLUCAP 127* 101* 333*    Oropharyngeal dysphagia Choking episode in the ED on initial presentation Evaluated by SLP-dysphagia 3 diet started which he seems to be tolerating.  Debility/deconditioning PT/OT eval-Home health recommended.  BMI: Estimated body mass index is 22.63 kg/m as calculated from the following:   Height as of this encounter: 5\' 6"  (1.676  m).   Weight as of this encounter: 63.6 kg.   Code status:   Code Status: Full Code   DVT Prophylaxis: SCDs Start: 10/19/23 0303 Place TED hose Start: 10/18/23 2057 rivaroxaban (XARELTO) tablet 20 mg     Family Communication: Daughter/spouse at bedside on 3/1.   Disposition Plan: Status is: Inpatient Remains inpatient appropriate because: Severity of illness    Planned Discharge Destination:Home health   Diet: Diet Order             DIET DYS 3 Room service appropriate? Yes with Assist; Fluid consistency: Thin  Diet effective now                     Antimicrobial agents: Anti-infectives (From admission, onward)    None        MEDICATIONS: Scheduled Meds:  atorvastatin  80 mg Oral QHS   feeding supplement  237 mL Oral BID BM   furosemide  40 mg Intravenous Daily   guaiFENesin  600 mg Oral BID   insulin aspart  0-15 Units Subcutaneous TID WC   insulin glargine  16 Units Subcutaneous QHS   metoprolol tartrate  50 mg Oral BID   multivitamin with minerals  1 tablet Oral Daily   predniSONE  10 mg Oral Q breakfast   rivaroxaban  20 mg Oral Q supper   sodium chloride flush  3 mL Intravenous Q12H   Continuous Infusions:   PRN Meds:.acetaminophen **OR** acetaminophen, albuterol, HYDROcodone-acetaminophen, metoprolol tartrate, ondansetron **OR** ondansetron (ZOFRAN) IV, sodium chloride flush   I have personally reviewed following labs and imaging studies  LABORATORY DATA: CBC: Recent Labs  Lab 10/18/23 1141 10/19/23 0432 10/20/23 0526 10/21/23 0534  WBC 7.2 10.3 7.8 8.4  NEUTROABS 6.8  --   --   --   HGB 12.0* 12.1* 11.7* 12.2*  HCT 35.1* 34.4* 34.1* 35.7*  MCV 100.0 98.0 99.7 100.3*  PLT 268 262 236 252    Basic Metabolic Panel: Recent Labs  Lab 10/18/23 1141 10/18/23 2349 10/19/23 0432 10/19/23 2056 10/20/23 0526 10/21/23 0534  NA 133* 135 140  --  139 141  K 4.4 2.9* 3.9  --  3.9 4.0  CL 96* 98 99  --  101 97*  CO2 29 25 29   --  32 29  GLUCOSE 507* 324* 66* 415* 119* 119*  BUN 34* 31* 29*  --  25* 23  CREATININE 0.98 0.83 0.74  --  0.69 0.62  CALCIUM 8.9 7.9* 9.2  --  8.7* 9.8  MG  --  1.4* 2.9*  --   --  1.7  PHOS  --  2.9 3.2  --   --   --     GFR: Estimated Creatinine Clearance: 66.3 mL/min (by C-G formula based on SCr of 0.62 mg/dL).  Liver Function Tests: Recent Labs  Lab 10/18/23 1141  10/19/23 0432  AST 38 34  ALT 51* 45*  ALKPHOS 87 83  BILITOT 0.8 0.7  PROT 6.6 6.5  ALBUMIN 2.3* 2.2*   No results for input(s): "LIPASE", "AMYLASE" in the last 168 hours. No results for input(s): "AMMONIA" in the last 168 hours.  Coagulation Profile: No results for input(s): "INR", "PROTIME" in the last 168 hours.  Cardiac Enzymes: Recent Labs  Lab 10/18/23 2349  CKTOTAL 82    BNP (last 3 results) No results for input(s): "PROBNP" in the last 8760 hours.  Lipid Profile: No results for input(s): "CHOL", "HDL", "LDLCALC", "TRIG", "CHOLHDL", "LDLDIRECT" in the  last 72 hours.  Thyroid Function Tests: No results for input(s): "TSH", "T4TOTAL", "FREET4", "T3FREE", "THYROIDAB" in the last 72 hours.  Anemia Panel: No results for input(s): "VITAMINB12", "FOLATE", "FERRITIN", "TIBC", "IRON", "RETICCTPCT" in the last 72 hours.  Urine analysis:    Component Value Date/Time   COLORURINE STRAW (A) 10/18/2023 1900   APPEARANCEUR CLEAR 10/18/2023 1900   LABSPEC 1.009 10/18/2023 1900   PHURINE 5.0 10/18/2023 1900   GLUCOSEU >=500 (A) 10/18/2023 1900   HGBUR SMALL (A) 10/18/2023 1900   HGBUR negative 07/20/2008 1053   BILIRUBINUR NEGATIVE 10/18/2023 1900   KETONESUR NEGATIVE 10/18/2023 1900   PROTEINUR NEGATIVE 10/18/2023 1900   UROBILINOGEN negative 07/20/2008 1053   NITRITE NEGATIVE 10/18/2023 1900   LEUKOCYTESUR NEGATIVE 10/18/2023 1900    Sepsis Labs: Lactic Acid, Venous No results found for: "LATICACIDVEN"  MICROBIOLOGY: Recent Results (from the past 240 hours)  Resp panel by RT-PCR (RSV, Flu A&B, Covid) Anterior Nasal Swab     Status: Abnormal   Collection Time: 10/18/23  8:56 PM   Specimen: Anterior Nasal Swab  Result Value Ref Range Status   SARS Coronavirus 2 by RT PCR POSITIVE (A) NEGATIVE Final   Influenza A by PCR NEGATIVE NEGATIVE Final   Influenza B by PCR NEGATIVE NEGATIVE Final    Comment: (NOTE) The Xpert Xpress SARS-CoV-2/FLU/RSV plus assay is  intended as an aid in the diagnosis of influenza from Nasopharyngeal swab specimens and should not be used as a sole basis for treatment. Nasal washings and aspirates are unacceptable for Xpert Xpress SARS-CoV-2/FLU/RSV testing.  Fact Sheet for Patients: BloggerCourse.com  Fact Sheet for Healthcare Providers: SeriousBroker.it  This test is not yet approved or cleared by the [place] FDA and has been authorized for detection and/or diagnosis of SARS-CoV-2 by FDA under an Emergency Use Authorization (EUA). This EUA will remain in effect (meaning this test can be used) for the duration of the COVID-19 declaration under Section 564(b)(1) of the Act, 21 U.S.C. section 360bbb-3(b)(1), unless the authorization is terminated or revoked.     Resp Syncytial Virus by PCR NEGATIVE NEGATIVE Final    Comment: (NOTE) Fact Sheet for Patients: BloggerCourse.com  Fact Sheet for Healthcare Providers: SeriousBroker.it  This test is not yet approved or cleared by the [place] FDA and has been authorized for detection and/or diagnosis of SARS-CoV-2 by FDA under an Emergency Use Authorization (EUA). This EUA will remain in effect (meaning this test can be used) for the duration of the COVID-19 declaration under Section 564(b)(1) of the Act, 21 U.S.C. section 360bbb-3(b)(1), unless the authorization is terminated or revoked.  Performed at [facility], [address] [place], [place] [address]     RADIOLOGY STUDIES/RESULTS: VAS [place] LOWER EXTREMITY VENOUS (DVT) Result Date: 10/21/2023  Lower Venous DVT Study Patient Name:  Todd [name]  Date of Exam:   10/20/2023 Medical Rec #: [id number]      Accession #:    [id number] Date of Birth: [birth year]       Patient Gender: M Patient Age:   85 years Exam Location:  [facility] Procedure:      VAS [place] LOWER EXTREMITY VENOUS (DVT) Referring  Phys: Osvaldo Shipper --------------------------------------------------------------------------------  Indications: Covid 19, and Swelling.  Comparison Study: No prior study on file Performing Technologist: Sherren Kerns RVS  Examination Guidelines: A complete evaluation includes B-mode imaging, spectral Doppler, color Doppler, and power Doppler as needed of all accessible portions of each vessel. Bilateral testing is considered an integral  part of a complete examination. Limited examinations for reoccurring indications may be performed as noted. The reflux portion of the exam is performed with the patient in reverse Trendelenburg.  +---------+---------------+---------+-----------+---------------+-------------+ RIGHT    CompressibilityPhasicitySpontaneityProperties     Thrombus                                                                 Aging         +---------+---------------+---------+-----------+---------------+-------------+ CFV      Full           Yes      No         pulsatile                                                                waveforms                    +---------+---------------+---------+-----------+---------------+-------------+ SFJ      Full                                                            +---------+---------------+---------+-----------+---------------+-------------+ FV Prox  Full                                                            +---------+---------------+---------+-----------+---------------+-------------+ FV Mid   Full                                                            +---------+---------------+---------+-----------+---------------+-------------+ FV DistalFull                                                            +---------+---------------+---------+-----------+---------------+-------------+ PFV      Full                                                             +---------+---------------+---------+-----------+---------------+-------------+ POP      Full           Yes      No         pulsatile  waveforms                    +---------+---------------+---------+-----------+---------------+-------------+ PTV      Full                                                            +---------+---------------+---------+-----------+---------------+-------------+ PERO     Full                                                            +---------+---------------+---------+-----------+---------------+-------------+ Gastroc  Full                                                            +---------+---------------+---------+-----------+---------------+-------------+   +--------+---------------+---------+-----------+----------------+-------------+ LEFT    CompressibilityPhasicitySpontaneityProperties      Thrombus                                                                 Aging         +--------+---------------+---------+-----------+----------------+-------------+ CFV     Full           Yes      No         pulsatile                                                                waveforms                     +--------+---------------+---------+-----------+----------------+-------------+ SFJ     Full                                                             +--------+---------------+---------+-----------+----------------+-------------+ FV Prox Full                                                             +--------+---------------+---------+-----------+----------------+-------------+ FV Mid  Full                                                             +--------+---------------+---------+-----------+----------------+-------------+  FV      Full                                                             Distal                                                                    +--------+---------------+---------+-----------+----------------+-------------+ PFV     Full                                                             +--------+---------------+---------+-----------+----------------+-------------+ POP     Full           Yes      No         pulsatile                                                                waveforms                     +--------+---------------+---------+-----------+----------------+-------------+ PTV     Full                                                             +--------+---------------+---------+-----------+----------------+-------------+ PERO    Full                                                             +--------+---------------+---------+-----------+----------------+-------------+     Summary: BILATERAL: - No evidence of deep vein thrombosis seen in the lower extremities, bilaterally. -No evidence of popliteal cyst, bilaterally.   *See table(s) above for measurements and observations. Electronically signed by Coral Else MD on 10/21/2023 at 10:43:15 AM.    Final    ECHOCARDIOGRAM COMPLETE Result Date: 10/19/2023    ECHOCARDIOGRAM REPORT   Patient Name:   Todd [name] Date of Exam: 10/19/2023 Medical Rec #:  [id number]     Height:       66.0 in Accession #:    [id number]    Weight:       138.0 lb Date of Birth:  1941/01/01      BSA:          1.708 m Patient Age:    82 years      BP:  100/50 mmHg Patient Gender: M             HR:           100 bpm. Exam Location:  Inpatient Procedure: 2D Echo (Both Spectral and Color Flow Doppler were utilized during            procedure). Indications:    R06.9 DOE  History:        Patient has prior history of Echocardiogram examinations, most                 recent 01/08/2023. CAD, PAD and Carotid Disease,                 Signs/Symptoms:Edema; Risk Factors:Dyslipidemia, Diabetes,                 Hypertension and  Non-Smoker.  Sonographer:    Dondra Prader RVT Referring Phys: [id number] ANASTASSIA DOUTOVA  Sonographer Comments: Patient coughing throughout exam. HR between 90 bpm - 125 bpm IMPRESSIONS  1. Left ventricular ejection fraction, by estimation, is 45%. The left ventricle has mildly decreased function. The left ventricle demonstrates global hypokinesis. There is mild concentric left ventricular hypertrophy. Left ventricular diastolic parameters are indeterminate.  2. Right ventricular systolic function is mildly reduced. The right ventricular size is mildly enlarged. There is normal pulmonary artery systolic pressure. The estimated right ventricular systolic pressure is 33.3 mmHg.  3. Left atrial size was mildly dilated.  4. The mitral valve is degenerative. Mild mitral valve regurgitation. No evidence of mitral stenosis. Moderate mitral annular calcification.  5. The aortic valve is tricuspid. There is moderate calcification of the aortic valve. Aortic valve regurgitation is not visualized. Aortic valve sclerosis/calcification is present, without any evidence of aortic stenosis.  6. The inferior vena cava is dilated in size with <50% respiratory variability, suggesting right atrial pressure of 15 mmHg.  7. The patient was in atrial fibrillation. FINDINGS  Left Ventricle: Left ventricular ejection fraction, by estimation, is 45%. The left ventricle has mildly decreased function. The left ventricle demonstrates global hypokinesis. The left ventricular internal cavity size was normal in size. There is mild concentric left ventricular hypertrophy. Left ventricular diastolic parameters are indeterminate. Right Ventricle: The right ventricular size is mildly enlarged. No increase in right ventricular wall thickness. Right ventricular systolic function is mildly reduced. There is normal pulmonary artery systolic pressure. The tricuspid regurgitant velocity  is 2.14 m/s, and with an assumed right atrial pressure of 15 mmHg, the  estimated right ventricular systolic pressure is 33.3 mmHg. Left Atrium: Left atrial size was mildly dilated. Right Atrium: Right atrial size was normal in size. Pericardium: There is no evidence of pericardial effusion. Mitral Valve: The mitral valve is degenerative in appearance. Moderate mitral annular calcification. Mild mitral valve regurgitation. No evidence of mitral valve stenosis. Tricuspid Valve: The tricuspid valve is normal in structure. Tricuspid valve regurgitation is mild. Aortic Valve: The aortic valve is tricuspid. There is moderate calcification of the aortic valve. Aortic valve regurgitation is not visualized. Aortic valve sclerosis/calcification is present, without any evidence of aortic stenosis. Aortic valve mean gradient measures 2.3 mmHg. Aortic valve peak gradient measures 3.9 mmHg. Aortic valve area, by VTI measures 1.61 cm. Pulmonic Valve: The pulmonic valve was normal in structure. Pulmonic valve regurgitation is not visualized. Aorta: The aortic root is normal in size and structure. Venous: The inferior vena cava is dilated in size with less than 50% respiratory variability, suggesting right atrial pressure of 15 mmHg. IAS/Shunts: No  atrial level shunt detected by color flow Doppler.  LEFT VENTRICLE PLAX 2D LVIDd:         3.10 cm LVIDs:         2.60 cm LV PW:         1.00 cm LV IVS:        1.50 cm LVOT diam:     2.10 cm LV SV:         29 LV SV Index:   17 LVOT Area:     3.46 cm  RIGHT VENTRICLE            IVC RV Basal diam:  3.30 cm    IVC diam: 2.10 cm RV S prime:     8.81 cm/s TAPSE (M-mode): 1.5 cm LEFT ATRIUM             Index        RIGHT ATRIUM           Index LA Vol (A2C):   47.4 ml 27.75 ml/m  RA Area:     14.20 cm LA Vol (A4C):   46.5 ml 27.22 ml/m  RA Volume:   32.10 ml  18.79 ml/m LA Biplane Vol: 49.8 ml 29.16 ml/m  AORTIC VALVE                    PULMONIC VALVE AV Area (Vmax):    1.75 cm     PV Vmax:       0.74 m/s AV Area (Vmean):   1.69 cm     PV Peak grad:  2.2  mmHg AV Area (VTI):     1.61 cm AV Vmax:           98.60 cm/s AV Vmean:          68.067 cm/s AV VTI:            0.183 m AV Peak Grad:      3.9 mmHg AV Mean Grad:      2.3 mmHg LVOT Vmax:         49.75 cm/s LVOT Vmean:        33.175 cm/s LVOT VTI:          0.085 m LVOT/AV VTI ratio: 0.46  AORTA Ao Root diam: 3.10 cm Ao Asc diam:  3.50 cm MITRAL VALVE                TRICUSPID VALVE MV Area (PHT): 4.39 cm     TR Peak grad:   18.3 mmHg MV Decel Time: 173 msec     TR Vmax:        214.00 cm/s MV E velocity: 116.00 cm/s MV A velocity: 39.10 cm/s   SHUNTS MV E/A ratio:  2.97         Systemic VTI:  0.08 m                             Systemic Diam: 2.10 cm Dalton McleanMD Electronically signed by Wilfred Lacy Signature Date/Time: 10/19/2023/12:11:19 PM    Final      LOS: 3 days   Jeoffrey Massed, MD  [facility]    To contact the attending provider between 7A-7P or the covering provider during after hours 7P-7A, please log into the web site [email or web address] and access using universal [place] password for that web site. If you do not have the password, please call the hospital operator.  10/21/2023, 11:27 AM

## 2023-10-21 NOTE — Plan of Care (Signed)

## 2023-10-22 DIAGNOSIS — R600 Localized edema: Secondary | ICD-10-CM | POA: Diagnosis not present

## 2023-10-22 DIAGNOSIS — I5021 Acute systolic (congestive) heart failure: Secondary | ICD-10-CM | POA: Diagnosis not present

## 2023-10-22 DIAGNOSIS — I480 Paroxysmal atrial fibrillation: Secondary | ICD-10-CM | POA: Diagnosis not present

## 2023-10-22 DIAGNOSIS — I4891 Unspecified atrial fibrillation: Secondary | ICD-10-CM

## 2023-10-22 DIAGNOSIS — J9601 Acute respiratory failure with hypoxia: Secondary | ICD-10-CM | POA: Diagnosis not present

## 2023-10-22 DIAGNOSIS — U071 COVID-19: Secondary | ICD-10-CM | POA: Diagnosis not present

## 2023-10-22 DIAGNOSIS — I5022 Chronic systolic (congestive) heart failure: Secondary | ICD-10-CM

## 2023-10-22 LAB — GLUCOSE, CAPILLARY
Glucose-Capillary: 122 mg/dL — ABNORMAL HIGH (ref 70–99)
Glucose-Capillary: 130 mg/dL — ABNORMAL HIGH (ref 70–99)
Glucose-Capillary: 163 mg/dL — ABNORMAL HIGH (ref 70–99)
Glucose-Capillary: 186 mg/dL — ABNORMAL HIGH (ref 70–99)
Glucose-Capillary: 217 mg/dL — ABNORMAL HIGH (ref 70–99)
Glucose-Capillary: 243 mg/dL — ABNORMAL HIGH (ref 70–99)
Glucose-Capillary: 405 mg/dL — ABNORMAL HIGH (ref 70–99)
Glucose-Capillary: 465 mg/dL — ABNORMAL HIGH (ref 70–99)

## 2023-10-22 LAB — BASIC METABOLIC PANEL
Anion gap: 7 (ref 5–15)
BUN: 31 mg/dL — ABNORMAL HIGH (ref 8–23)
CO2: 31 mmol/L (ref 22–32)
Calcium: 8.8 mg/dL — ABNORMAL LOW (ref 8.9–10.3)
Chloride: 100 mmol/L (ref 98–111)
Creatinine, Ser: 0.58 mg/dL — ABNORMAL LOW (ref 0.61–1.24)
GFR, Estimated: >60 mL/min (ref >60)
Glucose, Bld: 134 mg/dL — ABNORMAL HIGH (ref 70–99)
Potassium: 4.1 mmol/L (ref 3.5–5.1)
Sodium: 138 mmol/L (ref 135–145)

## 2023-10-22 LAB — MAGNESIUM: Magnesium: 1.8 mg/dL (ref 1.7–2.4)

## 2023-10-22 MED ORDER — FUROSEMIDE 40 MG PO TABS
40.0000 mg | ORAL_TABLET | Freq: Every day | ORAL | Status: DC
  Administered 2023-10-22 – 2023-10-25 (×4): 40 mg via ORAL
  Filled 2023-10-22 (×4): qty 1

## 2023-10-22 MED ORDER — METOPROLOL TARTRATE 100 MG PO TABS
100.0000 mg | ORAL_TABLET | Freq: Two times a day (BID) | ORAL | Status: DC
  Administered 2023-10-22 – 2023-10-25 (×7): 100 mg via ORAL
  Filled 2023-10-22 (×7): qty 1

## 2023-10-22 MED ORDER — TRAZODONE HCL 50 MG PO TABS
50.0000 mg | ORAL_TABLET | Freq: Every evening | ORAL | Status: DC | PRN
  Administered 2023-10-22: 50 mg via ORAL
  Filled 2023-10-22: qty 1

## 2023-10-22 NOTE — Plan of Care (Signed)
 Problem: Education: Goal: Ability to describe self-care measures that may prevent or decrease complications (Diabetes Survival Skills Education) will improve Outcome: Progressing Goal: Individualized Educational Video(s) Outcome: Progressing   Problem: Coping: Goal: Ability to adjust to condition or change in health will improve Outcome: Progressing   Problem: Fluid Volume: Goal: Ability to maintain a balanced intake and output will improve Outcome: Progressing   Problem: Health Behavior/Discharge Planning: Goal: Ability to identify and utilize available resources and services will improve Outcome: Progressing Goal: Ability to manage health-related needs will improve Outcome: Progressing   Problem: Metabolic: Goal: Ability to maintain appropriate glucose levels will improve Outcome: Progressing   Problem: Nutritional: Goal: Maintenance of adequate nutrition will improve Outcome: Progressing Goal: Progress toward achieving an optimal weight will improve Outcome: Progressing   Problem: Skin Integrity: Goal: Risk for impaired skin integrity will decrease Outcome: Progressing   Problem: Tissue Perfusion: Goal: Adequacy of tissue perfusion will improve Outcome: Progressing   Problem: Education: Goal: Knowledge of risk factors and measures for prevention of condition will improve Outcome: Progressing   Problem: Coping: Goal: Psychosocial and spiritual needs will be supported Outcome: Progressing   Problem: Respiratory: Goal: Will maintain a patent airway Outcome: Progressing Goal: Complications related to the disease process, condition or treatment will be avoided or minimized Outcome: Progressing   Problem: Education: Goal: Knowledge of General Education information will improve Description: Including pain rating scale, medication(s)/side effects and non-pharmacologic comfort measures Outcome: Progressing   Problem: Health Behavior/Discharge Planning: Goal:  Ability to manage health-related needs will improve Outcome: Progressing   Problem: Clinical Measurements: Goal: Ability to maintain clinical measurements within normal limits will improve Outcome: Progressing Goal: Will remain free from infection Outcome: Progressing Goal: Diagnostic test results will improve Outcome: Progressing Goal: Respiratory complications will improve Outcome: Progressing Goal: Cardiovascular complication will be avoided Outcome: Progressing   Problem: Activity: Goal: Risk for activity intolerance will decrease Outcome: Progressing   Problem: Nutrition: Goal: Adequate nutrition will be maintained Outcome: Progressing   Problem: Coping: Goal: Level of anxiety will decrease Outcome: Progressing   Problem: Elimination: Goal: Will not experience complications related to bowel motility Outcome: Progressing Goal: Will not experience complications related to urinary retention Outcome: Progressing   Problem: Pain Managment: Goal: General experience of comfort will improve and/or be controlled Outcome: Progressing   Problem: Safety: Goal: Ability to remain free from injury will improve Outcome: Progressing   Problem: Skin Integrity: Goal: Risk for impaired skin integrity will decrease Outcome: Progressing   Problem: Education: Goal: Knowledge of General Education information will improve Description: Including pain rating scale, medication(s)/side effects and non-pharmacologic comfort measures Outcome: Progressing   Problem: Health Behavior/Discharge Planning: Goal: Ability to manage health-related needs will improve Outcome: Progressing   Problem: Clinical Measurements: Goal: Ability to maintain clinical measurements within normal limits will improve Outcome: Progressing Goal: Will remain free from infection Outcome: Progressing Goal: Diagnostic test results will improve Outcome: Progressing Goal: Respiratory complications will  improve Outcome: Progressing Goal: Cardiovascular complication will be avoided Outcome: Progressing   Problem: Activity: Goal: Risk for activity intolerance will decrease Outcome: Progressing   Problem: Nutrition: Goal: Adequate nutrition will be maintained Outcome: Progressing   Problem: Coping: Goal: Level of anxiety will decrease Outcome: Progressing   Problem: Elimination: Goal: Will not experience complications related to bowel motility Outcome: Progressing Goal: Will not experience complications related to urinary retention Outcome: Progressing   Problem: Pain Managment: Goal: General experience of comfort will improve and/or be controlled Outcome: Progressing   Problem: Safety:  Goal: Ability to remain free from injury will improve Outcome: Progressing   Problem: Skin Integrity: Goal: Risk for impaired skin integrity will decrease Outcome: Progressing

## 2023-10-22 NOTE — Progress Notes (Signed)
 PROGRESS NOTE        PATIENT DETAILS Name: Todd [name] Age: 83 y.o. Sex: male Date of Birth: 06-May-1941 Admit Date: 10/18/2023 Admitting Physician Therisa Doyne, MD [id number], [name], MD  Brief Summary: Patient is a 82 y.o.  male with history of pulmonary fibrosis, PAF, HTN, HLD, DM-2, CAD s/p CABG who presented with progressive weakness and shortness of breath-he had an episode of choking in the emergency room-he was subsequently found to have COVID-19 infection and subsequently admitted to the hospitalist service.  See below for further details.  Significant events: 2/27>> admit to [facility] 3/01>> A-fib RVR  Significant studies: 2/27>> CXR: Coarse interstitial lung disease  2/28>> echo: EF 45%, global hypokinesis.  Significant microbiology data: 2/27>> COVID PCR: Positive 2/27>> influenza A/influenza B/RSV PCR: Negative  Procedures: None  Consults: None  Subjective: Overall better-less swelling in his legs-breathing is much better.  No major issues overnight.  Objective: Vitals: Blood pressure 107/64, pulse (!) 116, temperature 97.7 F (36.5 C), temperature source Oral, resp. rate (!) 21, height 5\' 6"  (1.676 m), weight 65.5 kg, SpO2 97%.   Exam: Gen Exam:Alert awake-not in any distress HEENT:atraumatic, normocephalic Chest: B/L clear to auscultation anteriorly CVS:S1S2 regular Abdomen:soft non tender, non distended Extremities:+ edema Neurology: Non focal Skin: no rash  Pertinent Labs/Radiology:    Latest Ref Rng & Units 10/21/2023    5:34 AM 10/20/2023    5:26 AM 10/19/2023    4:32 AM  CBC  WBC 4.0 - 10.5 K/uL 8.4  7.8  10.3   Hemoglobin 13.0 - 17.0 g/dL 14.7  82.9  56.2   Hematocrit 39.0 - 52.0 % 35.7  34.1  34.4   Platelets 150 - 400 K/uL 252  236  262     Lab Results  Component Value Date   NA 138 10/22/2023   K 4.1 10/22/2023   CL 100 10/22/2023   CO2 31 10/22/2023      Assessment/Plan: PAF with RVR Although rate  overall better-continues to have episodes of RVR BP soft-will see if we can increase metoprolol to 100 twice daily Remains on Xarelto Will get cardiology input at this point.  Telemetry monitoring.  HFmrEF exacerbation Significant improvement in lower extremity edema-now only minimal Will switch IV Lasix to oral route-to see if this will allow for some room to increase beta-blockers to better control his rate Soft BP precludes addition of other GDMT medications. Cardiology consulted today.  COVID-19 infection Supportive care at this point, already on prednisone Minimal to no hypoxia-does not require antivirals  Interstitial lung disease  History of asbestos exposure CRP not elevated Continue prednisone Continue furosemide-maintain negative balance. Follows with Dr. Isaiah Serge Assess for home O2 requirement prior to discharge.  Minimally elevated troponins Likely demand ischemia. No chest pain Echo with mildly reduced EF and global hypokinesis Cardiology consulted-await recommendations.  CAD s/p CABG No chest pain Continue statin/beta-blocker Suspect not on ASA as patient on anticoagulation.  DM-2 (A1c 12.7 on 2/27) with uncontrolled hyperglycemia CBGs slowly improving Continue Lantus 16 units p+ moderate SSI.    Recent Labs    10/22/23 0458 10/22/23 0730 10/22/23 1145  GLUCAP 130* 122* 405*    Oropharyngeal dysphagia Choking episode in the ED on initial presentation Evaluated by SLP-dysphagia 3 diet started which he seems to be tolerating.  Debility/deconditioning PT/OT eval-Home health recommended.  BMI: Estimated body  mass index is 23.31 kg/m as calculated from the following:   Height as of this encounter: 5\' 6"  (1.676 m).   Weight as of this encounter: 65.5 kg.   Code status:   Code Status: Full Code   DVT Prophylaxis: SCDs Start: 10/19/23 0303 Place TED hose Start: 10/18/23 2057 rivaroxaban (XARELTO) tablet 20 mg     Family Communication:  Daughter/spouse at bedside on 3/2   Disposition Plan: Status is: Inpatient Remains inpatient appropriate because: Severity of illness   Planned Discharge Destination:Home health   Diet: Diet Order             DIET DYS 3 Room service appropriate? Yes with Assist; Fluid consistency: Thin; Fluid restriction: 1500 mL Fluid  Diet effective now                     Antimicrobial agents: Anti-infectives (From admission, onward)    None        MEDICATIONS: Scheduled Meds:  atorvastatin  80 mg Oral QHS   feeding supplement (GLUCERNA SHAKE)  237 mL Oral TID BM   furosemide  40 mg Oral Daily   guaiFENesin  600 mg Oral BID   insulin aspart  0-15 Units Subcutaneous TID WC   insulin glargine  16 Units Subcutaneous QHS   metoprolol tartrate  100 mg Oral BID   multivitamin with minerals  1 tablet Oral Daily   predniSONE  10 mg Oral Q breakfast   rivaroxaban  20 mg Oral Q supper   sodium chloride flush  3 mL Intravenous Q12H   Continuous Infusions:   PRN Meds:.acetaminophen **OR** acetaminophen, albuterol, HYDROcodone-acetaminophen, metoprolol tartrate, ondansetron **OR** ondansetron (ZOFRAN) IV, sodium chloride flush   I have personally reviewed following labs and imaging studies  LABORATORY DATA: CBC: Recent Labs  Lab 10/18/23 1141 10/19/23 0432 10/20/23 0526 10/21/23 0534  WBC 7.2 10.3 7.8 8.4  NEUTROABS 6.8  --   --   --   HGB 12.0* 12.1* 11.7* 12.2*  HCT 35.1* 34.4* 34.1* 35.7*  MCV 100.0 98.0 99.7 100.3*  PLT 268 262 236 252    Basic Metabolic Panel: Recent Labs  Lab 10/18/23 2349 10/19/23 0432 10/19/23 2056 10/20/23 0526 10/21/23 0534 10/22/23 0530  NA 135 140  --  139 141 138  K 2.9* 3.9  --  3.9 4.0 4.1  CL 98 99  --  101 97* 100  CO2 25 29  --  32 29 31  GLUCOSE 324* 66* 415* 119* 119* 134*  BUN 31* 29*  --  25* 23 31*  CREATININE 0.83 0.74  --  0.69 0.62 0.58*  CALCIUM 7.9* 9.2  --  8.7* 9.8 8.8*  MG 1.4* 2.9*  --   --  1.7 1.8  PHOS  2.9 3.2  --   --   --   --     GFR: Estimated Creatinine Clearance: 66.5 mL/min (A) (by C-G formula based on SCr of 0.58 mg/dL (L)).  Liver Function Tests: Recent Labs  Lab 10/18/23 1141 10/19/23 0432  AST 38 34  ALT 51* 45*  ALKPHOS 87 83  BILITOT 0.8 0.7  PROT 6.6 6.5  ALBUMIN 2.3* 2.2*   No results for input(s): "LIPASE", "AMYLASE" in the last 168 hours. No results for input(s): "AMMONIA" in the last 168 hours.  Coagulation Profile: No results for input(s): "INR", "PROTIME" in the last 168 hours.  Cardiac Enzymes: Recent Labs  Lab 10/18/23 2349  CKTOTAL 82    BNP (last  3 results) No results for input(s): "PROBNP" in the last 8760 hours.  Lipid Profile: No results for input(s): "CHOL", "HDL", "LDLCALC", "TRIG", "CHOLHDL", "LDLDIRECT" in the last 72 hours.  Thyroid Function Tests: No results for input(s): "TSH", "T4TOTAL", "FREET4", "T3FREE", "THYROIDAB" in the last 72 hours.  Anemia Panel: No results for input(s): "VITAMINB12", "FOLATE", "FERRITIN", "TIBC", "IRON", "RETICCTPCT" in the last 72 hours.  Urine analysis:    Component Value Date/Time   COLORURINE STRAW (A) 10/18/2023 1900   APPEARANCEUR CLEAR 10/18/2023 1900   LABSPEC 1.009 10/18/2023 1900   PHURINE 5.0 10/18/2023 1900   GLUCOSEU >=500 (A) 10/18/2023 1900   HGBUR SMALL (A) 10/18/2023 1900   HGBUR negative 07/20/2008 1053   BILIRUBINUR NEGATIVE 10/18/2023 1900   KETONESUR NEGATIVE 10/18/2023 1900   PROTEINUR NEGATIVE 10/18/2023 1900   UROBILINOGEN negative 07/20/2008 1053   NITRITE NEGATIVE 10/18/2023 1900   LEUKOCYTESUR NEGATIVE 10/18/2023 1900    Sepsis Labs: Lactic Acid, Venous No results found for: "LATICACIDVEN"  MICROBIOLOGY: Recent Results (from the past 240 hours)  Resp panel by RT-PCR (RSV, Flu A&B, Covid) Anterior Nasal Swab     Status: Abnormal   Collection Time: 10/18/23  8:56 PM   Specimen: Anterior Nasal Swab  Result Value Ref Range Status   SARS Coronavirus 2 by RT PCR  POSITIVE (A) NEGATIVE Final   Influenza A by PCR NEGATIVE NEGATIVE Final   Influenza B by PCR NEGATIVE NEGATIVE Final    Comment: (NOTE) The Xpert Xpress SARS-CoV-2/FLU/RSV plus assay is intended as an aid in the diagnosis of influenza from Nasopharyngeal swab specimens and should not be used as a sole basis for treatment. Nasal washings and aspirates are unacceptable for Xpert Xpress SARS-CoV-2/FLU/RSV testing.  Fact Sheet for Patients: BloggerCourse.com  Fact Sheet for Healthcare Providers: SeriousBroker.it  This test is not yet approved or cleared by the [place] FDA and has been authorized for detection and/or diagnosis of SARS-CoV-2 by FDA under an Emergency Use Authorization (EUA). This EUA will remain in effect (meaning this test can be used) for the duration of the COVID-19 declaration under Section 564(b)(1) of the Act, 21 U.S.C. section 360bbb-3(b)(1), unless the authorization is terminated or revoked.     Resp Syncytial Virus by PCR NEGATIVE NEGATIVE Final    Comment: (NOTE) Fact Sheet for Patients: BloggerCourse.com  Fact Sheet for Healthcare Providers: SeriousBroker.it  This test is not yet approved or cleared by the [place] FDA and has been authorized for detection and/or diagnosis of SARS-CoV-2 by FDA under an Emergency Use Authorization (EUA). This EUA will remain in effect (meaning this test can be used) for the duration of the COVID-19 declaration under Section 564(b)(1) of the Act, 21 U.S.C. section 360bbb-3(b)(1), unless the authorization is terminated or revoked.  Performed at [facility], [address], [place], [place] [address]     RADIOLOGY STUDIES/RESULTS: VAS [place] LOWER EXTREMITY VENOUS (DVT) Result Date: 10/21/2023  Lower Venous DVT Study Patient Name:  Todd [name]  Date of Exam:   10/20/2023 Medical Rec #: [id number]       Accession #:    [id number] Date of Birth: 03-17-1941       Patient Gender: M Patient Age:   35 years Exam Location:  [facility] Procedure:      VAS [place] LOWER EXTREMITY VENOUS (DVT) Referring Phys: Osvaldo Shipper --------------------------------------------------------------------------------  Indications: Covid 19, and Swelling.  Comparison Study: No prior study on file Performing Technologist: Sherren Kerns RVS  Examination Guidelines: A  complete evaluation includes B-mode imaging, spectral Doppler, color Doppler, and power Doppler as needed of all accessible portions of each vessel. Bilateral testing is considered an integral part of a complete examination. Limited examinations for reoccurring indications may be performed as noted. The reflux portion of the exam is performed with the patient in reverse Trendelenburg.  +---------+---------------+---------+-----------+---------------+-------------+ RIGHT    CompressibilityPhasicitySpontaneityProperties     Thrombus                                                                 Aging         +---------+---------------+---------+-----------+---------------+-------------+ CFV      Full           Yes      No         pulsatile                                                                waveforms                    +---------+---------------+---------+-----------+---------------+-------------+ SFJ      Full                                                            +---------+---------------+---------+-----------+---------------+-------------+ FV Prox  Full                                                            +---------+---------------+---------+-----------+---------------+-------------+ FV Mid   Full                                                            +---------+---------------+---------+-----------+---------------+-------------+ FV DistalFull                                                             +---------+---------------+---------+-----------+---------------+-------------+ PFV      Full                                                            +---------+---------------+---------+-----------+---------------+-------------+ POP      Full  Yes      No         pulsatile                                                                waveforms                    +---------+---------------+---------+-----------+---------------+-------------+ PTV      Full                                                            +---------+---------------+---------+-----------+---------------+-------------+ PERO     Full                                                            +---------+---------------+---------+-----------+---------------+-------------+ Gastroc  Full                                                            +---------+---------------+---------+-----------+---------------+-------------+   +--------+---------------+---------+-----------+----------------+-------------+ LEFT    CompressibilityPhasicitySpontaneityProperties      Thrombus                                                                 Aging         +--------+---------------+---------+-----------+----------------+-------------+ CFV     Full           Yes      No         pulsatile                                                                waveforms                     +--------+---------------+---------+-----------+----------------+-------------+ SFJ     Full                                                             +--------+---------------+---------+-----------+----------------+-------------+ FV Prox Full                                                             +--------+---------------+---------+-----------+----------------+-------------+  FV Mid  Full                                                              +--------+---------------+---------+-----------+----------------+-------------+ FV      Full                                                             Distal                                                                   +--------+---------------+---------+-----------+----------------+-------------+ PFV     Full                                                             +--------+---------------+---------+-----------+----------------+-------------+ POP     Full           Yes      No         pulsatile                                                                waveforms                     +--------+---------------+---------+-----------+----------------+-------------+ PTV     Full                                                             +--------+---------------+---------+-----------+----------------+-------------+ PERO    Full                                                             +--------+---------------+---------+-----------+----------------+-------------+     Summary: BILATERAL: - No evidence of deep vein thrombosis seen in the lower extremities, bilaterally. -No evidence of popliteal cyst, bilaterally.   *See table(s) above for measurements and observations. Electronically signed by Coral Else MD on 10/21/2023 at 10:43:15 AM.    Final      LOS: 4 days   Jeoffrey Massed, MD  [facility]    To contact the attending provider between 7A-7P or the covering provider during after hours  7P-7A, please log into the web site [email or web address] and access using universal [place] password for that web site. If you do not have the password, please call the hospital operator.  10/22/2023, 12:21 PM

## 2023-10-22 NOTE — Consult Note (Addendum)
 Cardiology Consultation   Patient ID: BEAUMONT AUSTAD MRN: [id number]; DOB: [birth year]  Admit date: 10/18/2023 Date of Consult: 10/22/2023  PCP:  [name], MD    [facility]Care Providers Cardiologist:  Rollene Rotunda, MD  Electrophysiologist:  Lanier Prude, MD       Patient Profile:   Yehonatan Grandison Woodside is a 83 y.o. male with a hx of persistent atrial fibrillation, hypertension, hyperlipidemia, DM2, CAD s/p CABG 1999 and history of pulmonary fibrosis who is being seen 10/22/2023 for the evaluation of atrial fibrillation at the request of Dr. Jerral Ralph.  History of Present Illness:   Mr. Dambach is a pleasant but hard of hearing 84 year old male with past medical history of persistent atrial fibrillation, hypertension, hyperlipidemia, DM2, CAD s/p CABG 1999 and history of pulmonary fibrosis.  Patient had a CABG in 1999 with LIMA-LAD, SVG-OM1, SVG-OM 2, and SVG-RCA.  She was diagnosed with atrial fibrillation in 2015, subsequent Myoview in March 2015 was normal.  Last cardiac catheterization performed on 01/08/2023 demonstrated 70% ostial to mid left circumflex lesion, 100% OM1, 100% OM2, 100% proximal to mid LAD, 100% ostial to proximal RCA, 70% distal LAD, patent LIMA-LAD, patent SVG-OM2 with 65 to 70% lesion in the proximal graft, patent SVG to RCA, normal LVEDP.  Echocardiogram obtained on the same day showed EF 55 to 60%, no regional wall motion abnormality, trivial MR.  The last time he was in sinus rhythm was in August 2024.  EKG obtained on 04/27/2023 shows he was in atrial fibrillation.  He was seen by [name], PA-C on 08/01/2023 for evaluation of lower extremity edema.  Lower extremity edema has not responded to Lasix, therefore Lasix was discontinued.  Patient was seen by Dr. Lalla Brothers 09/26/2023, Dr. Lalla Brothers felt patient is a candidate for Tikosyn loading although will need to discontinue his amitriptyline in order to be loaded on Tikosyn, it was also discussed with the patient  possibility of consider Watchman device.  Eventually patient was agreeable to undergo Watchman workup.  Tikosyn loading was held off.  He has been having worsening leg edema for the past 5 weeks.  He also has fatigue.  He eventually sought medical attention at the [facility] on 10/18/2023.  On arrival, he has significant lower extremity edema and also was in atrial fibrillation with RVR.  BNP was 738.  Serial troponins 62-->57-->65-->99.  Total CK was negative.  Patient denies any recent chest pain.  He was incidentally found to be COVID-positive.  He is on chronic steroid therapy, therefore did not require antiviral.  He underwent IV diuresis, lower extremity edema resolved after 4 liter output.  Currently, patient is euvolemic.  He is metoprolol was back down to 50 twice daily yesterday however increased back up to 100 mg twice a day dosing.  He has been compliant with Xarelto prior to hospitalization.  Heart rate is currently about the 100 bpm, however even with minimal ambulation from the bed to the bathroom, heart rate does jump up to 150.  Echocardiogram obtained on 10/19/2023 showed EF 45%, RVSP 33.3 mmHg, mild MR.  Cardiology service consulted for atrial fibrillation.   Past Medical History:  Diagnosis Date   Allergic rhinitis    Asthma    Atrial fibrillation with rapid ventricular response (HCC)    a. newly diagnosed 11/03/13, spont conv to NSR, placed on xarelto.   CAD (coronary artery disease)    a. 1999; s/p CABG: LIMA to LAD, SVG to OM1, SVG to OM2,  SVG to RCA  b. Normal nuc 10/2013 (done because of new onset AF.)   COVID    Diabetes mellitus (HCC)    HLD (hyperlipidemia)    HTN (hypertension)    Nephrolithiasis    Overweight(278.02)    PVD (peripheral vascular disease) (HCC)    Carotid stenosis   Stroke [facility])     Past Surgical History:  Procedure Laterality Date   CAROTID ENDARTERECTOMY  1999   COLONOSCOPY  2014   negative;Dr Jarold Motto   COLONOSCOPY W/ POLYPECTOMY   2009   Dr Jarold Motto   CORONARY ARTERY BYPASS GRAFT  Aug.1999   HEMORRHOID BANDING     INTRACAPSULAR CATARACT EXTRACTION Bilateral 2018   LEFT HEART CATH AND CORS/GRAFTS ANGIOGRAPHY N/A 01/08/2023   Procedure: LEFT HEART CATH AND CORS/GRAFTS ANGIOGRAPHY;  Surgeon: [place], Peter M, MD;  Location: [facility] INVASIVE CV LAB;  Service: Cardiovascular;  Laterality: N/A;   NASAL SINUS SURGERY       Home Medications:  Prior to Admission medications   Medication Sig Start Date End Date Taking? Authorizing Provider  acetaminophen (TYLENOL) 650 MG CR tablet Take 650 mg by mouth every 8 (eight) hours as needed for pain.   Yes [provider]  atorvastatin (LIPITOR) 80 MG tablet Take 1 tablet (80 mg total) by mouth daily. Patient taking differently: Take 80 mg by mouth at bedtime. 01/10/23  Yes Hermelinda Dellen, MD  cholecalciferol (VITAMIN D3) 25 MCG (1000 UNIT) tablet Take 1,000 Units by mouth daily.   Yes [provider]  Cyanocobalamin (VITAMIN B-12 PO) Take 1 tablet by mouth daily.   Yes [provider]  famotidine (PEPCID) 40 MG tablet Take 2 tablets (80 mg total) by mouth at bedtime. 01/09/23  Yes Hermelinda Dellen, MD  furosemide (LASIX) 40 MG tablet Take 1 tablet (40 mg total) by mouth daily. 09/19/23  Yes Burns, [name], MD  insulin glargine (LANTUS SOLOSTAR) 100 UNIT/ML Solostar Pen Inject 25 Units into the skin daily. Patient taking differently: Inject 25 Units into the skin in the morning. 09/27/23  Yes Burns, [name], MD  ipratropium (ATROVENT) 0.06 % nasal spray Place 2-4 sprays into both nostrils every 8 (eight) hours as needed for rhinitis. 12/04/22  Yes [provider]  metFORMIN (GLUCOPHAGE-XR) 500 MG 24 hr tablet Take 1 tablet (500 mg total) by mouth 2 (two) times daily with a meal. 06/13/23  Yes Burns, [name], MD  metoprolol tartrate (LOPRESSOR) 100 MG tablet TAKE 1 TABLET BY MOUTH 2 TIMES DAILY. 10/02/23  Yes Rollene Rotunda, MD  Multiple Vitamin (MULTIVITAMIN  PO) Take 1 tablet by mouth daily.   Yes [provider]  nitroGLYCERIN (NITROSTAT) 0.4 MG SL tablet Place 1 tablet (0.4 mg total) under the tongue every 5 (five) minutes as needed for chest pain. 08/25/21 08/10/07 Yes Rollene Rotunda, MD  Omega-3 Fatty Acids (FISH OIL PO) Take 1 tablet by mouth 2 (two) times daily.   Yes [provider]  omeprazole (PRILOSEC) 40 MG capsule TAKE 1 CAPSULE BY MOUTH 2 TIMES DAILY. 05/18/23  Yes Burns, [name], MD  predniSONE (DELTASONE) 10 MG tablet TAKE 1 TABLET (10 MG TOTAL) BY MOUTH DAILY WITH BREAKFAST. 05/05/23  Yes Mannam, Praveen, MD  predniSONE (DELTASONE) 50 MG tablet Take tablet (50mg ) by mouth 13 hours, 7 hours, 1 hour prior to scan. 10/16/23  Yes Lanier Prude, MD  rivaroxaban (XARELTO) 20 MG TABS tablet TAKE 1 TABLET (20 MG TOTAL) BY MOUTH DAILY WITH SUPPER. 09/20/23  Yes  Rollene Rotunda, MD  Insulin Pen Needle (PEN NEEDLES) 32G X 5 MM MISC UAD for daily lantus injection 09/20/23   Burns, [name], MD  [facility] DELICA LANCETS 33G MISC TEST BLOOD SUGAR ONCE DAILY 01/23/18   Burns, [name], MD  [facility] VERIO test strip USE UP TO 4 TIMES A DAY AS DIRECTED 07/06/21   [name], MD    Inpatient Medications: Scheduled Meds:  atorvastatin  80 mg Oral QHS   feeding supplement (GLUCERNA SHAKE)  237 mL Oral TID BM   furosemide  40 mg Oral Daily   guaiFENesin  600 mg Oral BID   insulin aspart  0-15 Units Subcutaneous TID WC   insulin glargine  16 Units Subcutaneous QHS   metoprolol tartrate  100 mg Oral BID   multivitamin with minerals  1 tablet Oral Daily   predniSONE  10 mg Oral Q breakfast   rivaroxaban  20 mg Oral Q supper   sodium chloride flush  3 mL Intravenous Q12H   Continuous Infusions:  PRN Meds: acetaminophen **OR** acetaminophen, albuterol, HYDROcodone-acetaminophen, metoprolol tartrate, ondansetron **OR** ondansetron (ZOFRAN) IV, sodium chloride flush  Allergies:    Allergies  Allergen Reactions   Amlodipine Besylate      Rash Because of a history of documented adverse serious drug reaction;Medi Alert bracelet  is recommended   Iodinated Contrast Media Rash    Rash  Because of a history of documented adverse serious drug reaction;Medi Alert bracelet  is recommended  Rash, Because of a history of documented adverse serious drug reaction;Medi Alert bracelet  is recommended   Metformin And Related Diarrhea    Social History:   Social History   Socioeconomic History   Marital status: Married    Spouse name: Darel Hong   Number of children: 2   Years of education: Not on file   Highest education level: Not on file  Occupational History   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  Tobacco Use   Smoking status: Never   Smokeless tobacco: Never  Vaping Use   Vaping status: Never Used  Substance and Sexual Activity   Alcohol use: No    Alcohol/week: 0.0 standard drinks of alcohol   Drug use: No   Sexual activity: Yes  Other Topics Concern   Not on file  Social History Narrative   Right handed    Social Drivers of Health   Financial Resource Strain: Low Risk  (08/03/2023)   Overall Financial Resource Strain (CARDIA)    Difficulty of Paying Living Expenses: Not hard at all  Food Insecurity: No Food Insecurity (10/19/2023)   Hunger Vital Sign    Worried About Running Out of Food in the Last Year: Never true    Ran Out of Food in the Last Year: Never true  Transportation Needs: No Transportation Needs (10/19/2023)   PRAPARE - Transportation    Lack of Transportation (Medical): No    Lack of Transportation (Non-Medical): No  Physical Activity: Inactive (08/03/2023)   Exercise Vital Sign    Days of Exercise per Week: 0 days    Minutes of Exercise per Session: 0 min  Stress: No Stress Concern Present (08/03/2023)   [employer] of Occupational Health - Occupational Stress Questionnaire    Feeling of Stress : Not at all  Social Connections: Moderately Integrated  (10/19/2023)   Social Connection and Isolation Panel [NHANES]    Frequency of Communication with Friends and Family: More than three times a week  Frequency of Social Gatherings with Friends and Family: Twice a week    Attends Religious Services: More than 4 times per year    Active Member of [employer] or Organizations: No    Attends Banker Meetings: Never    Marital Status: Married  Catering manager Violence: Not At Risk (10/19/2023)   Humiliation, Afraid, Rape, and Kick questionnaire    Fear of Current or Ex-Partner: No    Emotionally Abused: No    Physically Abused: No    Sexually Abused: No    Family History:    Family History  Problem Relation Age of Onset   Heart attack Mother 36   Diabetes Father        IDDM   Stroke Father 69   Asthma Sister    Lupus Sister    Fibromyalgia Sister    Fibromyalgia Sister    Prostate cancer Brother    Hemochromatosis Brother    Colon cancer Brother 72   Heart attack Brother 20   Heart attack Brother 35   Esophageal cancer Neg Hx    Rectal cancer Neg Hx    Stomach cancer Neg Hx      ROS:  Please see the history of present illness.   All other ROS reviewed and negative.     Physical Exam/Data:   Vitals:   10/22/23 0800 10/22/23 0838 10/22/23 1200 10/22/23 1400  BP: 103/66 107/64 99/81 111/64  Pulse: 86 (!) 116 79 93  Resp: (!) 21  (!) 21 (!) 21  Temp:   98.8 F (37.1 C) 97.8 F (36.6 C)  TempSrc:   Oral Oral  SpO2: 97%  97% 95%  Weight:      Height:        Intake/Output Summary (Last 24 hours) at 10/22/2023 1637 Last data filed at 10/22/2023 0800 Gross per 24 hour  Intake 240 ml  Output 1100 ml  Net -860 ml      10/22/2023    5:00 AM 10/20/2023    5:00 AM 10/18/2023    9:15 AM  Last 3 Weights  Weight (lbs) 144 lb 6.4 oz 140 lb 3.4 oz 138 lb  Weight (kg) 65.5 kg 63.6 kg 62.596 kg     Body mass index is 23.31 kg/m.  General:  Well nourished, well developed, in no acute distress HEENT: normal Neck: no  JVD Vascular: No carotid bruits; Distal pulses 2+ bilaterally Cardiac:  normal S1, S2; RRR; no murmur  Lungs:  clear to auscultation bilaterally, no wheezing, rhonchi or rales  Abd: soft, nontender, no hepatomegaly  Ext: no edema Musculoskeletal:  No deformities, BUE and BLE strength normal and equal Skin: warm and dry  Neuro:  CNs 2-12 intact, no focal abnormalities noted Psych:  Normal affect   EKG:  The EKG was personally reviewed and demonstrates: Atrial fibrillation with RVR Telemetry:  Telemetry was personally reviewed and demonstrates: Atrial fibrillation, heart rate in the high 90s to low 100 range.  Relevant CV Studies:  Echo 10/19/2023 1. Left ventricular ejection fraction, by estimation, is 45%. The left  ventricle has mildly decreased function. The left ventricle demonstrates  global hypokinesis. There is mild concentric left ventricular hypertrophy.  Left ventricular diastolic  parameters are indeterminate.   2. Right ventricular systolic function is mildly reduced. The right  ventricular size is mildly enlarged. There is normal pulmonary artery  systolic pressure. The estimated right ventricular systolic pressure is  33.3 mmHg.   3. Left atrial size was mildly dilated.  4. The mitral valve is degenerative. Mild mitral valve regurgitation. No  evidence of mitral stenosis. Moderate mitral annular calcification.   5. The aortic valve is tricuspid. There is moderate calcification of the  aortic valve. Aortic valve regurgitation is not visualized. Aortic valve  sclerosis/calcification is present, without any evidence of aortic  stenosis.   6. The inferior vena cava is dilated in size with <50% respiratory  variability, suggesting right atrial pressure of 15 mmHg.   7. The patient was in atrial fibrillation.   Laboratory Data:  High Sensitivity Troponin:   Recent Labs  Lab 10/18/23 1141 10/18/23 1820 10/18/23 2349 10/19/23 0432  TROPONINIHS 62* 57* 65* 99*      Chemistry Recent Labs  Lab 10/19/23 0432 10/19/23 2056 10/20/23 0526 10/21/23 0534 10/22/23 0530  NA 140  --  139 141 138  K 3.9  --  3.9 4.0 4.1  CL 99  --  101 97* 100  CO2 29  --  32 29 31  GLUCOSE 66*   < > 119* 119* 134*  BUN 29*  --  25* 23 31*  CREATININE 0.74  --  0.69 0.62 0.58*  CALCIUM 9.2  --  8.7* 9.8 8.8*  MG 2.9*  --   --  1.7 1.8  GFRNONAA >60  --  >60 >60 >60  ANIONGAP 12  --  6 15 7    < > = values in this interval not displayed.    Recent Labs  Lab 10/18/23 1141 10/19/23 0432  PROT 6.6 6.5  ALBUMIN 2.3* 2.2*  AST 38 34  ALT 51* 45*  ALKPHOS 87 83  BILITOT 0.8 0.7   Lipids No results for input(s): "CHOL", "TRIG", "HDL", "LABVLDL", "LDLCALC", "CHOLHDL" in the last 168 hours.  Hematology Recent Labs  Lab 10/19/23 0432 10/20/23 0526 10/21/23 0534  WBC 10.3 7.8 8.4  RBC 3.51* 3.42* 3.56*  HGB 12.1* 11.7* 12.2*  HCT 34.4* 34.1* 35.7*  MCV 98.0 99.7 100.3*  MCH 34.5* 34.2* 34.3*  MCHC 35.2 34.3 34.2  RDW 16.8* 16.6* 16.6*  PLT 262 236 252   Thyroid No results for input(s): "TSH", "FREET4" in the last 168 hours.  BNP Recent Labs  Lab 10/18/23 1135  BNP 738.0*    DDimer No results for input(s): "DDIMER" in the last 168 hours.   Radiology/Studies:  VAS [place] LOWER EXTREMITY VENOUS (DVT) Result Date: 10/21/2023  Lower Venous DVT Study Patient Name:  CAMRY THEISS Ferrera  Date of Exam:   10/20/2023 Medical Rec #: [id number]      Accession #:    [id number] Date of Birth: 1940-12-07       Patient Gender: M Patient Age:   82 years Exam Location:  [facility] Procedure:      VAS [place] LOWER EXTREMITY VENOUS (DVT) Referring Phys: Osvaldo Shipper --------------------------------------------------------------------------------  Indications: Covid 19, and Swelling.  Comparison Study: No prior study on file Performing Technologist: Sherren Kerns RVS  Examination Guidelines: A complete evaluation includes B-mode imaging, spectral Doppler, color Doppler, and power  Doppler as needed of all accessible portions of each vessel. Bilateral testing is considered an integral part of a complete examination. Limited examinations for reoccurring indications may be performed as noted. The reflux portion of the exam is performed with the patient in reverse Trendelenburg.  +---------+---------------+---------+-----------+---------------+-------------+ RIGHT    CompressibilityPhasicitySpontaneityProperties     Thrombus  Aging         +---------+---------------+---------+-----------+---------------+-------------+ CFV      Full           Yes      No         pulsatile                                                                waveforms                    +---------+---------------+---------+-----------+---------------+-------------+ SFJ      Full                                                            +---------+---------------+---------+-----------+---------------+-------------+ FV Prox  Full                                                            +---------+---------------+---------+-----------+---------------+-------------+ FV Mid   Full                                                            +---------+---------------+---------+-----------+---------------+-------------+ FV DistalFull                                                            +---------+---------------+---------+-----------+---------------+-------------+ PFV      Full                                                            +---------+---------------+---------+-----------+---------------+-------------+ POP      Full           Yes      No         pulsatile                                                                waveforms                    +---------+---------------+---------+-----------+---------------+-------------+ PTV      Full                                                             +---------+---------------+---------+-----------+---------------+-------------+  PERO     Full                                                            +---------+---------------+---------+-----------+---------------+-------------+ Gastroc  Full                                                            +---------+---------------+---------+-----------+---------------+-------------+   +--------+---------------+---------+-----------+----------------+-------------+ LEFT    CompressibilityPhasicitySpontaneityProperties      Thrombus                                                                 Aging         +--------+---------------+---------+-----------+----------------+-------------+ CFV     Full           Yes      No         pulsatile                                                                waveforms                     +--------+---------------+---------+-----------+----------------+-------------+ SFJ     Full                                                             +--------+---------------+---------+-----------+----------------+-------------+ FV Prox Full                                                             +--------+---------------+---------+-----------+----------------+-------------+ FV Mid  Full                                                             +--------+---------------+---------+-----------+----------------+-------------+ FV      Full                                                             Distal                                                                   +--------+---------------+---------+-----------+----------------+-------------+  PFV     Full                                                             +--------+---------------+---------+-----------+----------------+-------------+ POP     Full           Yes      No         pulsatile                                                                 waveforms                     +--------+---------------+---------+-----------+----------------+-------------+ PTV     Full                                                             +--------+---------------+---------+-----------+----------------+-------------+ PERO    Full                                                             +--------+---------------+---------+-----------+----------------+-------------+     Summary: BILATERAL: - No evidence of deep vein thrombosis seen in the lower extremities, bilaterally. -No evidence of popliteal cyst, bilaterally.   *See table(s) above for measurements and observations. Electronically signed by Coral Else MD on 10/21/2023 at 10:43:15 AM.    Final    ECHOCARDIOGRAM COMPLETE Result Date: 10/19/2023    ECHOCARDIOGRAM REPORT   Patient Name:   GENESIS PAGET Slane Date of Exam: 10/19/2023 Medical Rec #:  [id number]     Height:       66.0 in Accession #:    [id number]    Weight:       138.0 lb Date of Birth:  Aug 22, 1940      BSA:          1.708 m Patient Age:    82 years      BP:           100/50 mmHg Patient Gender: M             HR:           100 bpm. Exam Location:  Inpatient Procedure: 2D Echo (Both Spectral and Color Flow Doppler were utilized during            procedure). Indications:    R06.9 DOE  History:        Patient has prior history of Echocardiogram examinations, most                 recent 01/08/2023. CAD, PAD and Carotid Disease,                 Signs/Symptoms:Edema; Risk Factors:Dyslipidemia, Diabetes,  Hypertension and Non-Smoker.  Sonographer:    Dondra Prader RVT Referring Phys: [id number] ANASTASSIA DOUTOVA  Sonographer Comments: Patient coughing throughout exam. HR between 90 bpm - 125 bpm IMPRESSIONS  1. Left ventricular ejection fraction, by estimation, is 45%. The left ventricle has mildly decreased function. The left ventricle demonstrates global hypokinesis. There is mild concentric  left ventricular hypertrophy. Left ventricular diastolic parameters are indeterminate.  2. Right ventricular systolic function is mildly reduced. The right ventricular size is mildly enlarged. There is normal pulmonary artery systolic pressure. The estimated right ventricular systolic pressure is 33.3 mmHg.  3. Left atrial size was mildly dilated.  4. The mitral valve is degenerative. Mild mitral valve regurgitation. No evidence of mitral stenosis. Moderate mitral annular calcification.  5. The aortic valve is tricuspid. There is moderate calcification of the aortic valve. Aortic valve regurgitation is not visualized. Aortic valve sclerosis/calcification is present, without any evidence of aortic stenosis.  6. The inferior vena cava is dilated in size with <50% respiratory variability, suggesting right atrial pressure of 15 mmHg.  7. The patient was in atrial fibrillation. FINDINGS  Left Ventricle: Left ventricular ejection fraction, by estimation, is 45%. The left ventricle has mildly decreased function. The left ventricle demonstrates global hypokinesis. The left ventricular internal cavity size was normal in size. There is mild concentric left ventricular hypertrophy. Left ventricular diastolic parameters are indeterminate. Right Ventricle: The right ventricular size is mildly enlarged. No increase in right ventricular wall thickness. Right ventricular systolic function is mildly reduced. There is normal pulmonary artery systolic pressure. The tricuspid regurgitant velocity  is 2.14 m/s, and with an assumed right atrial pressure of 15 mmHg, the estimated right ventricular systolic pressure is 33.3 mmHg. Left Atrium: Left atrial size was mildly dilated. Right Atrium: Right atrial size was normal in size. Pericardium: There is no evidence of pericardial effusion. Mitral Valve: The mitral valve is degenerative in appearance. Moderate mitral annular calcification. Mild mitral valve regurgitation. No evidence of  mitral valve stenosis. Tricuspid Valve: The tricuspid valve is normal in structure. Tricuspid valve regurgitation is mild. Aortic Valve: The aortic valve is tricuspid. There is moderate calcification of the aortic valve. Aortic valve regurgitation is not visualized. Aortic valve sclerosis/calcification is present, without any evidence of aortic stenosis. Aortic valve mean gradient measures 2.3 mmHg. Aortic valve peak gradient measures 3.9 mmHg. Aortic valve area, by VTI measures 1.61 cm. Pulmonic Valve: The pulmonic valve was normal in structure. Pulmonic valve regurgitation is not visualized. Aorta: The aortic root is normal in size and structure. Venous: The inferior vena cava is dilated in size with less than 50% respiratory variability, suggesting right atrial pressure of 15 mmHg. IAS/Shunts: No atrial level shunt detected by color flow Doppler.  LEFT VENTRICLE PLAX 2D LVIDd:         3.10 cm LVIDs:         2.60 cm LV PW:         1.00 cm LV IVS:        1.50 cm LVOT diam:     2.10 cm LV SV:         29 LV SV Index:   17 LVOT Area:     3.46 cm  RIGHT VENTRICLE            IVC RV Basal diam:  3.30 cm    IVC diam: 2.10 cm RV S prime:     8.81 cm/s TAPSE (M-mode): 1.5 cm LEFT ATRIUM  Index        RIGHT ATRIUM           Index LA Vol (A2C):   47.4 ml 27.75 ml/m  RA Area:     14.20 cm LA Vol (A4C):   46.5 ml 27.22 ml/m  RA Volume:   32.10 ml  18.79 ml/m LA Biplane Vol: 49.8 ml 29.16 ml/m  AORTIC VALVE                    PULMONIC VALVE AV Area (Vmax):    1.75 cm     PV Vmax:       0.74 m/s AV Area (Vmean):   1.69 cm     PV Peak grad:  2.2 mmHg AV Area (VTI):     1.61 cm AV Vmax:           98.60 cm/s AV Vmean:          68.067 cm/s AV VTI:            0.183 m AV Peak Grad:      3.9 mmHg AV Mean Grad:      2.3 mmHg LVOT Vmax:         49.75 cm/s LVOT Vmean:        33.175 cm/s LVOT VTI:          0.085 m LVOT/AV VTI ratio: 0.46  AORTA Ao Root diam: 3.10 cm Ao Asc diam:  3.50 cm MITRAL VALVE                 TRICUSPID VALVE MV Area (PHT): 4.39 cm     TR Peak grad:   18.3 mmHg MV Decel Time: 173 msec     TR Vmax:        214.00 cm/s MV E velocity: 116.00 cm/s MV A velocity: 39.10 cm/s   SHUNTS MV E/A ratio:  2.97         Systemic VTI:  0.08 m                             Systemic Diam: 2.10 cm Dalton McleanMD Electronically signed by Wilfred Lacy Signature Date/Time: 10/19/2023/12:11:19 PM    Final      Assessment and Plan:   Persistent atrial fibrillation  -Last time his EKG was in sinus rhythm was in August 2024.  He was in atrial fibrillation on EKG in September 2024.  He likely has been in persistent atrial fibrillation since then.  He was seen by Dr. Lalla Brothers and felt to be a good candidate for Tikosyn and Watchman device.  He was agreeable to proceed with Watchman device and is currently undergoing evaluation.  -He is hesitant to take Xarelto long-term due to easy bruising and bleeding risk.  However he has been compliant with Xarelto in the past few months.  -Case discussed with Dr. Bjorn Pippin, one option is to consider antiarrhythmic therapy, another option is to proceed with rate control and outpatient cardioversion.  Dr. Bjorn Pippin felt with his heart rate borderline controlled, we plan to continue rate control and outpatient cardioversion.  He is not a good candidate for amiodarone due to history of pulmonary fibrosis.  -although Dr. Lovena Neighbours previous note mention patient is very symptomatic and does not do well in A-fib, however patient cardiac awareness has decreased and now does not feel the A-fib at all.   Acute systolic heart failure: EF 45% on echocardiogram 10/11/2023.  Previous EF  55 to 60% on 01/08/2023.  Likely related to persistent atrial fibrillation since September.  He received IV diuresis on admission, now appears euvolemic  COVID-positive: Already on steroid therapy for pulmonary fibrosis, therefore did not start on antiviral.  Currently on 1 L oxygen.  Lower extremity edema:  Resolved.  Likely multifactorial due to steroid use, HFmrEF +/- venous stasis  CAD s/p CABG 1999: serial troponin borderline elevated, however remained flat.  This is inconsistent with ACS.  Patient denies any recent chest pain.  Last cardiac catheterization in 2024 showed patent grafts, however SVG to OM had 65% lesion in the graft.  History of pulmonary fibrosis: On chronic steroid therapy.  Hypertension: On 100 mg twice daily on metoprolol tartrate for rate control.  Hyperlipidemia DM2   Risk Assessment/Risk Scores:          CHA2DS2-VASc Score = 5   This indicates a 7.2% annual risk of stroke. The patient's score is based upon: CHF History: 1 HTN History: 1 Diabetes History: 1 Stroke History: 0 Vascular Disease History: 1 Age Score: 2 Gender Score: 0         For questions or updates, please contact [facility] Please consult [email or web address] for contact info under    Ramond Dial, [place]  10/22/2023 4:37 PM  Patient seen and examined.  Agree with above documentation.  Mr. Inabinet is an 83 year old male with a history of persistent atrial fibrillation, T2DM, CAD status post CABG in 1999, pulmonary fibrosis, hypertension who were consulted by Dr. Jerral Ralph for evaluation of atrial fibrillation.  He has a history of CABG in 1999 with LIMA-LAD, SVG-OM1, SVG-OM2, SVG-RCA.  Most recent heart catheterization 12/2022 showed patent grafts with 65 to 70% lesion in proximal SVG-OM2 graft.  Echocardiogram 12/2022 showed EF 55 to 60%.  He was referred to EP for A-fib, saw Dr. Lalla Brothers 09/25/2022.  Discussed Tikosyn loading as well as considering Watchman device given his issues with bruising.  He presented to the ED on 10/18/2023 due to worsening lower extremity edema and fatigue.  He was noted to be in A-fib with RVR on arrival.  Labs notable for BNP 738 and found to be COVID-19 positive.  He was diuresed with IV Lasix, has had good diuresis and has been switched back to p.o. Lasix.   Echocardiogram 10/19/2023 showed EF 45%.  EKG 10/20/2023 shows A-fib with RVR, rate 136, LVH, poor R wave progression, Q waves in leads III, aVF.  From review of telemetry, he is in A-fib with rates previously controlled down to 80s but had period today where was up to 130s.  On exam, patient is alert and oriented, irregular, tachycardic, no murmurs, lungs CTAB, no LE edema or JVD.  For his atrial fibrillation, he is borderline rate controlled.  Suspect this will continue to improve as he recovers from COVID infection.  Metoprolol was increased to 100 mg twice daily this morning, would continue.  He missed a dose of Xarelto last week.  Can consider outpatient DCCV once recovers from COVID and completes 3 weeks of uninterrupted anticoagulation.  For his acute systolic heart failure, suspect likely due to A-fib with RVR.  He has diuresed well on IV Lasix, now appears euvolemic on exam and converted to p.o. Lasix  Little Ishikawa, MD

## 2023-10-22 NOTE — Inpatient Diabetes Management (Signed)
 Inpatient Diabetes Program Recommendations  AACE/ADA: New Consensus Statement on Inpatient Glycemic Control (2015)  Target Ranges:  Prepandial:   less than 140 mg/dL      Peak postprandial:   less than 180 mg/dL (1-2 hours)      Critically ill patients:  140 - 180 mg/dL    Latest Reference Range & Units 10/22/23 07:30 10/22/23 11:45 10/22/23 12:44  Glucose-Capillary 70 - 99 mg/dL 161 (H)  2 units Novolog  405 (H) 465 (H)  22 units Novolog   (H): Data is abnormally high     Home DM Meds: Lantus 25 units daily     Metformin 500 mg BID   Current Orders: Lantus 16 units at HS  Novolog 0-15 units TID  Prednisone 10 mg daily     MD- Please consider adding Novolog Meal Coverage:  Novolog 4 units TID with meals HOLD if pt NPO HOLD if pt eats <50% meals    --Will follow patient during hospitalization--  Ambrose Finland RN, MSN, CDCES Diabetes Coordinator Inpatient Glycemic Control Team Team Pager: [phone] (8a-5p)

## 2023-10-22 NOTE — Progress Notes (Signed)
 Physical Therapy Treatment Patient Details Name: Todd [name] MRN: [id number] DOB: 26-Apr-1941 Today's Date: 10/22/2023   History of Present Illness Pt is 84 yo male presented on 10/18/23 with progressive weakness and shortness of breath and worsening LE edema.  Pt found to bed COVID +.  Pt with hx including but not limited to A.fib on Xarelto, DM2, pulmonary fibrosis, HTN, HLD, DM2, CAD sp CABG    PT Comments  Patient making good progress with mobility however limited by HR. Pt resting in bed but agreeable to work with PT and requesting to attempt to go to the bathroom. Pt required Min assist for sit<>stand from EOB and toilet, close CGA for gait to bathroom with cues to maintain close proximity. PT HR elevated to 140's with gait but recovered to 90-110's with seated rest. Pt on RA and noted to be dyspneic but denied SOB, SpO2 90-92% with occasional drop to low/mid 80's with use of RW. EOS pt returned to bed and repositioned in partial chair position. Rt UE IV noted to be bleeding and RN notified. All needs and call bell within reach, family present, and RN arriving to room. Will continue to progress pt as able.   If plan is discharge home, recommend the following: A little help with walking and/or transfers;A little help with bathing/dressing/bathroom;Assistance with cooking/housework;Help with stairs or ramp for entrance   Can travel by private vehicle        Equipment Recommendations  Rolling walker (2 wheels)    Recommendations for Other Services       Precautions / Restrictions Precautions Precautions: Fall Recall of Precautions/Restrictions: Intact Restrictions Weight Bearing Restrictions Per Provider Order: No     Mobility  Bed Mobility Overal bed mobility: Needs Assistance Bed Mobility: Supine to Sit, Sit to Supine     Supine to sit: Contact guard, HOB elevated, Used rails Sit to supine: Min assist, Used rails, HOB elevated   General bed mobility comments: pt taking extra  time to sit up to EOB, use of bed features. Min assist to bring LE's onto bed EOS.    Transfers Overall transfer level: Needs assistance Equipment used: Rolling walker (2 wheels) Transfers: Sit to/from Stand Sit to Stand: Min assist           General transfer comment: Min assist for rise from EOB, pt with good hand placement. cue to use grab bar in bathroom for toilet transfer, slightly greater min assist to power up from low toilet height.    Ambulation/Gait Ambulation/Gait assistance: Contact guard assist Gait Distance (Feet): 15 Feet (2x15) Assistive device: Rolling walker (2 wheels) Gait Pattern/deviations: Step-through pattern, Decreased stride length, Shuffle, Trunk flexed Gait velocity: decr     General Gait Details: amb shor tbout to bathroom and back, pt denied fatigue but dyspneic. pt on RA, SpO2 90% with occasional drop to mid/low 80's when pt using hand with monitor. HR elevated in RVR with max of 145 bpm with gait and quick recovery to 90-110's with seated rest.   Stairs             Wheelchair Mobility     Tilt Bed    Modified Rankin (Stroke Patients Only)       Balance Overall balance assessment: Needs assistance Sitting-balance support: No upper extremity supported Sitting balance-Leahy Scale: Good     Standing balance support: During functional activity, Bilateral upper extremity supported, Reliant on assistive device for balance Standing balance-Leahy Scale: Fair  Communication Communication Communication: Impaired Factors Affecting Communication: Hearing impaired  Cognition Arousal: Alert Behavior During Therapy: WFL for tasks assessed/performed   PT - Cognitive impairments: No apparent impairments                         Following commands: Intact      Cueing Cueing Techniques: Verbal cues  Exercises      General Comments        Pertinent Vitals/Pain Pain Assessment Pain  Assessment: No/denies pain Pain Location: buttock in sitting (reports 2 wounds) Pain Descriptors / Indicators: Discomfort Pain Intervention(s): Limited activity within patient's tolerance, Monitored during session, Repositioned    Home Living                          Prior Function            PT Goals (current goals can now be found in the care plan section) Acute Rehab PT Goals Patient Stated Goal: return home PT Goal Formulation: With patient/family Time For Goal Achievement: 11/02/23 Potential to Achieve Goals: Good Progress towards PT goals: Progressing toward goals    Frequency    Min 1X/week      PT Plan      Co-evaluation              AM-PAC PT "6 Clicks" Mobility   Outcome Measure  Help needed turning from your back to your side while in a flat bed without using bedrails?: A Little Help needed moving from lying on your back to sitting on the side of a flat bed without using bedrails?: A Little Help needed moving to and from a bed to a chair (including a wheelchair)?: A Little Help needed standing up from a chair using your arms (e.g., wheelchair or bedside chair)?: A Little Help needed to walk in hospital room?: A Little Help needed climbing 3-5 steps with a railing? : A Little 6 Click Score: 18    End of Session Equipment Utilized During Treatment: Gait belt;Oxygen Activity Tolerance: Patient tolerated treatment well Patient left: in bed;with call bell/phone within reach;with bed alarm set;with family/visitor present Nurse Communication: Mobility status PT Visit Diagnosis: Other abnormalities of gait and mobility (R26.89);Muscle weakness (generalized) (M62.81)     Time: 1610-9604 PT Time Calculation (min) (ACUTE ONLY): 35 min  Charges:    $Gait Training: 8-22 mins $Therapeutic Activity: 8-22 mins PT General Charges $$ ACUTE PT VISIT: 1 Visit                     Wynn Maudlin, DPT Acute Rehabilitation Services Office  [phone]  10/22/23 3:41 PM

## 2023-10-22 NOTE — Plan of Care (Signed)
 Problem: Education: Goal: Ability to describe self-care measures that may prevent or decrease complications (Diabetes Survival Skills Education) will improve 10/22/2023 2212 by Azucena Cecil, RN Outcome: Progressing 10/22/2023 2212 by Azucena Cecil, RN Outcome: Progressing Goal: Individualized Educational Video(s) 10/22/2023 2212 by Azucena Cecil, RN Outcome: Progressing 10/22/2023 2212 by Azucena Cecil, RN Outcome: Progressing   Problem: Coping: Goal: Ability to adjust to condition or change in health will improve 10/22/2023 2212 by Azucena Cecil, RN Outcome: Progressing 10/22/2023 2212 by Azucena Cecil, RN Outcome: Progressing   Problem: Fluid Volume: Goal: Ability to maintain a balanced intake and output will improve 10/22/2023 2212 by Azucena Cecil, RN Outcome: Progressing 10/22/2023 2212 by Azucena Cecil, RN Outcome: Progressing   Problem: Health Behavior/Discharge Planning: Goal: Ability to identify and utilize available resources and services will improve 10/22/2023 2212 by Azucena Cecil, RN Outcome: Progressing 10/22/2023 2212 by Azucena Cecil, RN Outcome: Progressing Goal: Ability to manage health-related needs will improve 10/22/2023 2212 by Azucena Cecil, RN Outcome: Progressing 10/22/2023 2212 by Azucena Cecil, RN Outcome: Progressing   Problem: Metabolic: Goal: Ability to maintain appropriate glucose levels will improve 10/22/2023 2212 by Azucena Cecil, RN Outcome: Progressing 10/22/2023 2212 by Azucena Cecil, RN Outcome: Progressing   Problem: Nutritional: Goal: Maintenance of adequate nutrition will improve 10/22/2023 2212 by Azucena Cecil, RN Outcome: Progressing 10/22/2023 2212 by Azucena Cecil, RN Outcome: Progressing Goal: Progress toward achieving an optimal weight will improve 10/22/2023 2212 by Azucena Cecil, RN Outcome: Progressing 10/22/2023 2212 by Azucena Cecil, RN Outcome: Progressing   Problem:  Skin Integrity: Goal: Risk for impaired skin integrity will decrease 10/22/2023 2212 by Azucena Cecil, RN Outcome: Progressing 10/22/2023 2212 by Azucena Cecil, RN Outcome: Progressing   Problem: Tissue Perfusion: Goal: Adequacy of tissue perfusion will improve 10/22/2023 2212 by Azucena Cecil, RN Outcome: Progressing 10/22/2023 2212 by Azucena Cecil, RN Outcome: Progressing   Problem: Education: Goal: Knowledge of risk factors and measures for prevention of condition will improve 10/22/2023 2212 by Azucena Cecil, RN Outcome: Progressing 10/22/2023 2212 by Azucena Cecil, RN Outcome: Progressing   Problem: Coping: Goal: Psychosocial and spiritual needs will be supported 10/22/2023 2212 by Azucena Cecil, RN Outcome: Progressing 10/22/2023 2212 by Azucena Cecil, RN Outcome: Progressing   Problem: Respiratory: Goal: Will maintain a patent airway 10/22/2023 2212 by Azucena Cecil, RN Outcome: Progressing 10/22/2023 2212 by Azucena Cecil, RN Outcome: Progressing Goal: Complications related to the disease process, condition or treatment will be avoided or minimized 10/22/2023 2212 by Azucena Cecil, RN Outcome: Progressing 10/22/2023 2212 by Azucena Cecil, RN Outcome: Progressing   Problem: Education: Goal: Knowledge of General Education information will improve Description: Including pain rating scale, medication(s)/side effects and non-pharmacologic comfort measures 10/22/2023 2212 by Azucena Cecil, RN Outcome: Progressing 10/22/2023 2212 by Azucena Cecil, RN Outcome: Progressing   Problem: Health Behavior/Discharge Planning: Goal: Ability to manage health-related needs will improve 10/22/2023 2212 by Azucena Cecil, RN Outcome: Progressing 10/22/2023 2212 by Azucena Cecil, RN Outcome: Progressing   Problem: Clinical Measurements: Goal: Ability to maintain clinical measurements within normal limits will improve 10/22/2023 2212 by Azucena Cecil, RN Outcome: Progressing 10/22/2023 2212 by Azucena Cecil, RN Outcome: Progressing Goal: Will remain free from infection 10/22/2023 2212 by Azucena Cecil, RN Outcome: Progressing 10/22/2023 2212 by Azucena Cecil, RN Outcome: Progressing Goal: Diagnostic test  results will improve 10/22/2023 2212 by Azucena Cecil, RN Outcome: Progressing 10/22/2023 2212 by Azucena Cecil, RN Outcome: Progressing Goal: Respiratory complications will improve 10/22/2023 2212 by Azucena Cecil, RN Outcome: Progressing 10/22/2023 2212 by Azucena Cecil, RN Outcome: Progressing Goal: Cardiovascular complication will be avoided 10/22/2023 2212 by Azucena Cecil, RN Outcome: Progressing 10/22/2023 2212 by Azucena Cecil, RN Outcome: Progressing   Problem: Activity: Goal: Risk for activity intolerance will decrease 10/22/2023 2212 by Azucena Cecil, RN Outcome: Progressing 10/22/2023 2212 by Azucena Cecil, RN Outcome: Progressing   Problem: Nutrition: Goal: Adequate nutrition will be maintained 10/22/2023 2212 by Azucena Cecil, RN Outcome: Progressing 10/22/2023 2212 by Azucena Cecil, RN Outcome: Progressing   Problem: Coping: Goal: Level of anxiety will decrease 10/22/2023 2212 by Azucena Cecil, RN Outcome: Progressing 10/22/2023 2212 by Azucena Cecil, RN Outcome: Progressing   Problem: Elimination: Goal: Will not experience complications related to bowel motility 10/22/2023 2212 by Azucena Cecil, RN Outcome: Progressing 10/22/2023 2212 by Azucena Cecil, RN Outcome: Progressing Goal: Will not experience complications related to urinary retention 10/22/2023 2212 by Azucena Cecil, RN Outcome: Progressing 10/22/2023 2212 by Azucena Cecil, RN Outcome: Progressing   Problem: Pain Managment: Goal: General experience of comfort will improve and/or be controlled 10/22/2023 2212 by Azucena Cecil, RN Outcome: Progressing 10/22/2023 2212 by Azucena Cecil, RN Outcome: Progressing   Problem: Safety: Goal: Ability to remain free from injury will improve 10/22/2023 2212 by Azucena Cecil, RN Outcome: Progressing 10/22/2023 2212 by Azucena Cecil, RN Outcome: Progressing   Problem: Skin Integrity: Goal: Risk for impaired skin integrity will decrease 10/22/2023 2212 by Azucena Cecil, RN Outcome: Progressing 10/22/2023 2212 by Azucena Cecil, RN Outcome: Progressing   Problem: Education: Goal: Knowledge of General Education information will improve Description: Including pain rating scale, medication(s)/side effects and non-pharmacologic comfort measures 10/22/2023 2212 by Azucena Cecil, RN Outcome: Progressing 10/22/2023 2212 by Azucena Cecil, RN Outcome: Progressing   Problem: Health Behavior/Discharge Planning: Goal: Ability to manage health-related needs will improve 10/22/2023 2212 by Azucena Cecil, RN Outcome: Progressing 10/22/2023 2212 by Azucena Cecil, RN Outcome: Progressing   Problem: Clinical Measurements: Goal: Ability to maintain clinical measurements within normal limits will improve 10/22/2023 2212 by Azucena Cecil, RN Outcome: Progressing 10/22/2023 2212 by Azucena Cecil, RN Outcome: Progressing Goal: Will remain free from infection 10/22/2023 2212 by Azucena Cecil, RN Outcome: Progressing 10/22/2023 2212 by Azucena Cecil, RN Outcome: Progressing Goal: Diagnostic test results will improve 10/22/2023 2212 by Azucena Cecil, RN Outcome: Progressing 10/22/2023 2212 by Azucena Cecil, RN Outcome: Progressing Goal: Respiratory complications will improve 10/22/2023 2212 by Azucena Cecil, RN Outcome: Progressing 10/22/2023 2212 by Azucena Cecil, RN Outcome: Progressing Goal: Cardiovascular complication will be avoided 10/22/2023 2212 by Azucena Cecil, RN Outcome: Progressing 10/22/2023 2212 by Azucena Cecil, RN Outcome: Progressing   Problem: Activity: Goal: Risk  for activity intolerance will decrease 10/22/2023 2212 by Azucena Cecil, RN Outcome: Progressing 10/22/2023 2212 by Azucena Cecil, RN Outcome: Progressing   Problem: Nutrition: Goal: Adequate nutrition will be maintained 10/22/2023 2212 by Azucena Cecil, RN Outcome: Progressing 10/22/2023 2212 by Azucena Cecil, RN Outcome: Progressing   Problem: Coping: Goal: Level of anxiety will decrease 10/22/2023 2212 by Azucena Cecil, RN Outcome: Progressing 10/22/2023 2212 by Azucena Cecil, RN Outcome: Progressing   Problem: Elimination: Goal:  Will not experience complications related to bowel motility 10/22/2023 2212 by Azucena Cecil, RN Outcome: Progressing 10/22/2023 2212 by Azucena Cecil, RN Outcome: Progressing Goal: Will not experience complications related to urinary retention 10/22/2023 2212 by Azucena Cecil, RN Outcome: Progressing 10/22/2023 2212 by Azucena Cecil, RN Outcome: Progressing   Problem: Pain Managment: Goal: General experience of comfort will improve and/or be controlled 10/22/2023 2212 by Azucena Cecil, RN Outcome: Progressing 10/22/2023 2212 by Azucena Cecil, RN Outcome: Progressing   Problem: Safety: Goal: Ability to remain free from injury will improve 10/22/2023 2212 by Azucena Cecil, RN Outcome: Progressing 10/22/2023 2212 by Azucena Cecil, RN Outcome: Progressing   Problem: Skin Integrity: Goal: Risk for impaired skin integrity will decrease 10/22/2023 2212 by Azucena Cecil, RN Outcome: Progressing 10/22/2023 2212 by Azucena Cecil, RN Outcome: Progressing

## 2023-10-23 ENCOUNTER — Other Ambulatory Visit (HOSPITAL_COMMUNITY): Payer: Self-pay

## 2023-10-23 ENCOUNTER — Encounter (HOSPITAL_COMMUNITY): Payer: Self-pay | Admitting: Internal Medicine

## 2023-10-23 ENCOUNTER — Telehealth (HOSPITAL_COMMUNITY): Payer: Self-pay

## 2023-10-23 DIAGNOSIS — J9601 Acute respiratory failure with hypoxia: Secondary | ICD-10-CM | POA: Diagnosis not present

## 2023-10-23 DIAGNOSIS — I5021 Acute systolic (congestive) heart failure: Secondary | ICD-10-CM | POA: Diagnosis not present

## 2023-10-23 DIAGNOSIS — U071 COVID-19: Secondary | ICD-10-CM | POA: Diagnosis not present

## 2023-10-23 DIAGNOSIS — R600 Localized edema: Secondary | ICD-10-CM | POA: Diagnosis not present

## 2023-10-23 DIAGNOSIS — I480 Paroxysmal atrial fibrillation: Secondary | ICD-10-CM | POA: Diagnosis not present

## 2023-10-23 DIAGNOSIS — I4891 Unspecified atrial fibrillation: Secondary | ICD-10-CM | POA: Diagnosis not present

## 2023-10-23 LAB — BASIC METABOLIC PANEL
Anion gap: 7 (ref 5–15)
BUN: 34 mg/dL — ABNORMAL HIGH (ref 8–23)
CO2: 30 mmol/L (ref 22–32)
Calcium: 8.9 mg/dL (ref 8.9–10.3)
Chloride: 100 mmol/L (ref 98–111)
Creatinine, Ser: 0.61 mg/dL (ref 0.61–1.24)
GFR, Estimated: >60 mL/min (ref >60)
Glucose, Bld: 172 mg/dL — ABNORMAL HIGH (ref 70–99)
Potassium: 3.9 mmol/L (ref 3.5–5.1)
Sodium: 137 mmol/L (ref 135–145)

## 2023-10-23 LAB — GLUCOSE, CAPILLARY
Glucose-Capillary: 156 mg/dL — ABNORMAL HIGH (ref 70–99)
Glucose-Capillary: 179 mg/dL — ABNORMAL HIGH (ref 70–99)
Glucose-Capillary: 193 mg/dL — ABNORMAL HIGH (ref 70–99)
Glucose-Capillary: 193 mg/dL — ABNORMAL HIGH (ref 70–99)
Glucose-Capillary: 375 mg/dL — ABNORMAL HIGH (ref 70–99)
Glucose-Capillary: 478 mg/dL — ABNORMAL HIGH (ref 70–99)

## 2023-10-23 LAB — MAGNESIUM: Magnesium: 1.8 mg/dL (ref 1.7–2.4)

## 2023-10-23 MED ORDER — DIGOXIN 125 MCG PO TABS
0.1250 mg | ORAL_TABLET | Freq: Every day | ORAL | Status: DC
  Administered 2023-10-24 – 2023-10-25 (×2): 0.125 mg via ORAL
  Filled 2023-10-23 (×2): qty 1

## 2023-10-23 MED ORDER — MAGNESIUM SULFATE 2 GM/50ML IV SOLN
2.0000 g | Freq: Once | INTRAVENOUS | Status: AC
  Administered 2023-10-23: 2 g via INTRAVENOUS
  Filled 2023-10-23: qty 50

## 2023-10-23 MED ORDER — DIPHENHYDRAMINE HCL 25 MG PO CAPS
25.0000 mg | ORAL_CAPSULE | Freq: Every evening | ORAL | Status: DC | PRN
  Administered 2023-10-23: 25 mg via ORAL
  Filled 2023-10-23: qty 1

## 2023-10-23 MED ORDER — DIGOXIN 0.25 MG/ML IJ SOLN
0.2500 mg | Freq: Four times a day (QID) | INTRAMUSCULAR | Status: AC
  Administered 2023-10-23 (×2): 0.25 mg via INTRAVENOUS
  Filled 2023-10-23 (×2): qty 1

## 2023-10-23 MED ORDER — POTASSIUM CHLORIDE CRYS ER 20 MEQ PO TBCR
20.0000 meq | EXTENDED_RELEASE_TABLET | Freq: Once | ORAL | Status: AC
  Administered 2023-10-23: 20 meq via ORAL
  Filled 2023-10-23: qty 1

## 2023-10-23 NOTE — Telephone Encounter (Signed)
 Pharmacy Patient Advocate Encounter  Insurance verification completed.    The patient is insured through [facility]. Patient has Medicare and is not eligible for a copay card, but may be able to apply for patient assistance or Medicare RX Payment Plan (Patient Must reach out to their plan, if eligible for payment plan), if available.    Ran test claim for [employer]  and the current 30 day co-pay is $0.00. Ran test claim for Dexcom G6 and it requires a prior authorization.   This test claim was processed through [facility]- copay amounts may vary at other pharmacies due to pharmacy/plan contracts, or as the patient moves through the different stages of their insurance plan.

## 2023-10-23 NOTE — Progress Notes (Addendum)
 PROGRESS NOTE        PATIENT DETAILS Name: Todd [name] Age: 83 y.o. Sex: male Date of Birth: 1941/01/15 Admit Date: 10/18/2023 Admitting Physician Therisa Doyne, MD ZOX:[id number], [name], MD  Brief Summary: Patient is a 83 y.o.  male with history of pulmonary fibrosis, PAF, HTN, HLD, DM-2, CAD s/p CABG who presented with progressive weakness and shortness of breath-he had an episode of choking in the emergency room-he was subsequently found to have COVID-19 infection and subsequently admitted to the hospitalist service.  Hospital course complicated by A-fib with RVR-see below for further details.  Significant events: 2/27>> admit to [facility] 3/01>> A-fib RVR  Significant studies: 2/27>> CXR: Coarse interstitial lung disease  2/28>> echo: EF 45%, global hypokinesis.  Significant microbiology data: 2/27>> COVID PCR: Positive 2/27>> influenza A/influenza B/RSV PCR: Negative  Procedures: None  Consults: Cardiology  Subjective: Overall better-hardly any leg edema-continues to have heart rate in the 140s with ambulation.  Objective: Vitals: Blood pressure 101/64, pulse 85, temperature 97.6 F (36.4 C), temperature source Oral, resp. rate (!) 24, height 5\' 6"  (1.676 m), weight 65.4 kg, SpO2 95%.   Exam: Gen Exam:Alert awake-not in any distress HEENT:atraumatic, normocephalic Chest: B/L clear to auscultation anteriorly CVS:S1S2 irregular Abdomen:soft non tender, non distended Extremities:trace edema Neurology: Non focal Skin: no rash  Pertinent Labs/Radiology:    Latest Ref Rng & Units 10/21/2023    5:34 AM 10/20/2023    5:26 AM 10/19/2023    4:32 AM  CBC  WBC 4.0 - 10.5 K/uL 8.4  7.8  10.3   Hemoglobin 13.0 - 17.0 g/dL 54.0  98.1  19.1   Hematocrit 39.0 - 52.0 % 35.7  34.1  34.4   Platelets 150 - 400 K/uL 252  236  262     Lab Results  Component Value Date   NA 137 10/23/2023   K 3.9 10/23/2023   CL 100 10/23/2023   CO2 30 10/23/2023       Assessment/Plan: PAF with RVR Remains in A-fib-rate well-controlled at rest-but up to the 140s with ambulation On metoprolol 100 mg twice daily Remains on Xarelto Cardiology following-awaiting further recommendations.   HFmrEF exacerbation Significant improvement in lower extremity edema with diuretics Switched to oral Lasix on 3/3 Remains on oral beta-blockers Soft BP precludes addition of other GDMT medications Cardiology following  COVID-19 infection Supportive care at this point, already on prednisone Minimal to no hypoxia-does not require antivirals  Interstitial lung disease  History of asbestos exposure CRP not elevated Continue prednisone Continue furosemide-maintain negative balance. Follows with Dr. Isaiah Serge Assess for home O2 requirement prior to discharge.  Minimally elevated troponins Likely demand ischemia. No chest pain Echo with mildly reduced EF and global hypokinesis Cardiology consulted-await recommendations.  CAD s/p CABG No chest pain Continue statin/beta-blocker Suspect not on ASA as patient on anticoagulation.  DM-2 (A1c 12.7 on 2/27) with uncontrolled hyperglycemia CBGs relatively stable-add 1 episode of CBGs of more than 400 yesterday which I probably think was diet related Continue Lantus 16 units daily+ SSI  Recent Labs    10/22/23 2327 10/23/23 0450 10/23/23 0908  GLUCAP 243* 179* 156*    Oropharyngeal dysphagia Choking episode in the ED on initial presentation Evaluated by SLP-dysphagia 3 diet started which he seems to be tolerating.  Debility/deconditioning PT/OT eval-Home health recommended.  BMI: Estimated body mass index is 23.27  kg/m as calculated from the following:   Height as of this encounter: 5\' 6"  (1.676 m).   Weight as of this encounter: 65.4 kg.   Code status:   Code Status: Full Code   DVT Prophylaxis: SCDs Start: 10/19/23 0303 Place TED hose Start: 10/18/23 2057 rivaroxaban (XARELTO) tablet 20 mg      Family Communication: Daughter/spouse at bedside on 3/3   Disposition Plan: Status is: Inpatient Remains inpatient appropriate because: Severity of illness   Planned Discharge Destination:Home health likely 3/5.   Diet: Diet Order             DIET DYS 3 Room service appropriate? Yes with Assist; Fluid consistency: Thin; Fluid restriction: 1500 mL Fluid  Diet effective now                     Antimicrobial agents: Anti-infectives (From admission, onward)    None        MEDICATIONS: Scheduled Meds:  atorvastatin  80 mg Oral QHS   digoxin  0.25 mg Intravenous Q6H   [START ON 10/24/2023] digoxin  0.125 mg Oral Daily   feeding supplement (GLUCERNA SHAKE)  237 mL Oral TID BM   furosemide  40 mg Oral Daily   guaiFENesin  600 mg Oral BID   insulin aspart  0-15 Units Subcutaneous TID WC   insulin glargine  16 Units Subcutaneous QHS   metoprolol tartrate  100 mg Oral BID   multivitamin with minerals  1 tablet Oral Daily   predniSONE  10 mg Oral Q breakfast   rivaroxaban  20 mg Oral Q supper   sodium chloride flush  3 mL Intravenous Q12H   Continuous Infusions:   PRN Meds:.acetaminophen **OR** acetaminophen, albuterol, HYDROcodone-acetaminophen, metoprolol tartrate, ondansetron **OR** ondansetron (ZOFRAN) IV, sodium chloride flush, traZODone   I have personally reviewed following labs and imaging studies  LABORATORY DATA: CBC: Recent Labs  Lab 10/18/23 1141 10/19/23 0432 10/20/23 0526 10/21/23 0534  WBC 7.2 10.3 7.8 8.4  NEUTROABS 6.8  --   --   --   HGB 12.0* 12.1* 11.7* 12.2*  HCT 35.1* 34.4* 34.1* 35.7*  MCV 100.0 98.0 99.7 100.3*  PLT 268 262 236 252    Basic Metabolic Panel: Recent Labs  Lab 10/18/23 2349 10/19/23 0432 10/19/23 2056 10/20/23 0526 10/21/23 0534 10/22/23 0530 10/23/23 0518  NA 135 140  --  139 141 138 137  K 2.9* 3.9  --  3.9 4.0 4.1 3.9  CL 98 99  --  101 97* 100 100  CO2 25 29  --  32 29 31 30   GLUCOSE 324* 66* 415*  119* 119* 134* 172*  BUN 31* 29*  --  25* 23 31* 34*  CREATININE 0.83 0.74  --  0.69 0.62 0.58* 0.61  CALCIUM 7.9* 9.2  --  8.7* 9.8 8.8* 8.9  MG 1.4* 2.9*  --   --  1.7 1.8 1.8  PHOS 2.9 3.2  --   --   --   --   --     GFR: Estimated Creatinine Clearance: 66.5 mL/min (by C-G formula based on SCr of 0.61 mg/dL).  Liver Function Tests: Recent Labs  Lab 10/18/23 1141 10/19/23 0432  AST 38 34  ALT 51* 45*  ALKPHOS 87 83  BILITOT 0.8 0.7  PROT 6.6 6.5  ALBUMIN 2.3* 2.2*   No results for input(s): "LIPASE", "AMYLASE" in the last 168 hours. No results for input(s): "AMMONIA" in the last 168 hours.  Coagulation Profile: No results for input(s): "INR", "PROTIME" in the last 168 hours.  Cardiac Enzymes: Recent Labs  Lab 10/18/23 2349  CKTOTAL 82    BNP (last 3 results) No results for input(s): "PROBNP" in the last 8760 hours.  Lipid Profile: No results for input(s): "CHOL", "HDL", "LDLCALC", "TRIG", "CHOLHDL", "LDLDIRECT" in the last 72 hours.  Thyroid Function Tests: No results for input(s): "TSH", "T4TOTAL", "FREET4", "T3FREE", "THYROIDAB" in the last 72 hours.  Anemia Panel: No results for input(s): "VITAMINB12", "FOLATE", "FERRITIN", "TIBC", "IRON", "RETICCTPCT" in the last 72 hours.  Urine analysis:    Component Value Date/Time   COLORURINE STRAW (A) 10/18/2023 1900   APPEARANCEUR CLEAR 10/18/2023 1900   LABSPEC 1.009 10/18/2023 1900   PHURINE 5.0 10/18/2023 1900   GLUCOSEU >=500 (A) 10/18/2023 1900   HGBUR SMALL (A) 10/18/2023 1900   HGBUR negative 07/20/2008 1053   BILIRUBINUR NEGATIVE 10/18/2023 1900   KETONESUR NEGATIVE 10/18/2023 1900   PROTEINUR NEGATIVE 10/18/2023 1900   UROBILINOGEN negative 07/20/2008 1053   NITRITE NEGATIVE 10/18/2023 1900   LEUKOCYTESUR NEGATIVE 10/18/2023 1900    Sepsis Labs: Lactic Acid, Venous No results found for: "LATICACIDVEN"  MICROBIOLOGY: Recent Results (from the past 240 hours)  Resp panel by RT-PCR (RSV, Flu  A&B, Covid) Anterior Nasal Swab     Status: Abnormal   Collection Time: 10/18/23  8:56 PM   Specimen: Anterior Nasal Swab  Result Value Ref Range Status   SARS Coronavirus 2 by RT PCR POSITIVE (A) NEGATIVE Final   Influenza A by PCR NEGATIVE NEGATIVE Final   Influenza B by PCR NEGATIVE NEGATIVE Final    Comment: (NOTE) The Xpert Xpress SARS-CoV-2/FLU/RSV plus assay is intended as an aid in the diagnosis of influenza from Nasopharyngeal swab specimens and should not be used as a sole basis for treatment. Nasal washings and aspirates are unacceptable for Xpert Xpress SARS-CoV-2/FLU/RSV testing.  Fact Sheet for Patients: BloggerCourse.com  Fact Sheet for Healthcare Providers: SeriousBroker.it  This test is not yet approved or cleared by the [place] FDA and has been authorized for detection and/or diagnosis of SARS-CoV-2 by FDA under an Emergency Use Authorization (EUA). This EUA will remain in effect (meaning this test can be used) for the duration of the COVID-19 declaration under Section 564(b)(1) of the Act, 21 U.S.C. section 360bbb-3(b)(1), unless the authorization is terminated or revoked.     Resp Syncytial Virus by PCR NEGATIVE NEGATIVE Final    Comment: (NOTE) Fact Sheet for Patients: BloggerCourse.com  Fact Sheet for Healthcare Providers: SeriousBroker.it  This test is not yet approved or cleared by the [place] FDA and has been authorized for detection and/or diagnosis of SARS-CoV-2 by FDA under an Emergency Use Authorization (EUA). This EUA will remain in effect (meaning this test can be used) for the duration of the COVID-19 declaration under Section 564(b)(1) of the Act, 21 U.S.C. section 360bbb-3(b)(1), unless the authorization is terminated or revoked.  Performed at [facility], [address] [place], [place] [address]     RADIOLOGY  STUDIES/RESULTS: No results found.    LOS: 5 days   Jeoffrey Massed, MD  [facility]    To contact the attending provider between 7A-7P or the covering provider during after hours 7P-7A, please log into the web site [email or web address] and access using universal [place] password for that web site. If you do not have the password, please call the hospital operator.  10/23/2023, 11:28 AM

## 2023-10-23 NOTE — Progress Notes (Signed)
 Speech Language Pathology Treatment: Dysphagia  Patient Details Name: Todd [name] MRN: [id number] DOB: May 13, 1941 Today's Date: 10/23/2023 Time: 4098-1191 SLP Time Calculation (min) (ACUTE ONLY): 28 min  Assessment / Plan / Recommendation Clinical Impression  Pt was seen with family at bedside. His daughter says that his intake has been limited by mechanical soft diet. We did discuss episode from the ED with difficulty masticating a piece of chicken, but she says despite deteriorating condition of dentition, he has been able to chew food well at home PTA and has been doing well with mechanical soft solids. Pt was given regular textures and thin liquids, which he consumed with Mod I. Trace to no residue was seen lingually after dry cracker. Pt feels like he is ready to advance to regular solids and family would like this as well. Education was reinforced about still considering softer options and taking bites that are small enough that he can adequately chew them. Per nursing, family is often here at meal times and has been ordering all of his trays, so they can also support him with this. Will continue to follow at least briefly.    HPI HPI: 82 yo presenting 2/27 with weakness, found to have COVID. While in the ED he had a coughing and vomiting episode. CXR without acute findings. Pt has seen SLP at outside facility with FEES 03/26/23 showing mild pharyngeal dysphagia likely secondary to generalized deconditioning. Dysphagia was primarily characterized by decreased base of tongue retraction, diminished pharyngeal sensation, and incomplete laryngeal vestibule closure, resulting in mild to moderate post-swallow residual along the base of tongue and valleculae that increased with viscosity and reduced with a secondary swallow. Penetration but no aspiration observed. He had aggressive coughing after the study that was suspicious for possible esophageal issues/referred sensation or possibly related to talking. Pt  follows with ENT for chronic cough and is s/p SLN block with f/u with ENT and swallow study reportedly planned for April. PMH also includes: A.fib on Xarelto, DM2, pulmonary fibrosis, HTN, HLD, DM2, CAD s/p CABG, stroke      SLP Plan  Continue with current plan of care      Recommendations for follow up therapy are one component of a multi-disciplinary discharge planning process, led by the attending physician.  Recommendations may be updated based on patient status, additional functional criteria and insurance authorization.    Recommendations  Diet recommendations: Regular;Thin liquid Liquids provided via: Cup;Straw Medication Administration: Whole meds with liquid Supervision: Patient able to self feed;Full supervision/cueing for compensatory strategies Compensations: Minimize environmental distractions;Slow rate;Small sips/bites;Follow solids with liquid Postural Changes and/or Swallow Maneuvers: Seated upright 90 degrees;Upright 30-60 min after meal                  Oral care BID     Dysphagia, oropharyngeal phase (R13.12)     Continue with current plan of care     Mahala Menghini., M.A. CCC-SLP Acute Rehabilitation Services Office [phone]  Secure chat preferred   10/23/2023, 3:34 PM

## 2023-10-23 NOTE — Progress Notes (Signed)
 Pharmacy - Digoxin  Ongoing Afib with RVR To begin digoxin   Plan: Digoxin 0.25 mg iv Q 6 hours x 2 doses then 0.125 mg po daily starting 3/5 Digoxin level in 5 to 7 days if still inpatient.  Thank you Okey Regal, PharmD

## 2023-10-23 NOTE — Inpatient Diabetes Management (Addendum)
 Inpatient Diabetes Program Recommendations  AACE/ADA: New Consensus Statement on Inpatient Glycemic Control (2015)  Target Ranges:  Prepandial:   less than 140 mg/dL      Peak postprandial:   less than 180 mg/dL (1-2 hours)      Critically ill patients:  140 - 180 mg/dL     Latest Reference Range & Units 10/22/23 07:30 10/22/23 11:45 10/22/23 12:44 10/22/23 16:38 10/22/23 20:13 10/22/23 23:27 10/23/23 04:50 10/23/23 09:08  Glucose-Capillary 70 - 99 mg/dL 161 (H)  Novolog 2 units 405 (H) 465 (H)  Novolog 22 units 217 (H)  Novolog 5 units 186 (H)     Lantus 16 units 243 (H) 179 (H) 156 (H)  Novolog 3 units    Home DM Meds: Lantus 25 units daily     Metformin 500 mg BID   Current Orders: Lantus 16 units at HS  Novolog 0-15 units TID  Prednisone 10 mg daily    MD- Please consider adding Novolog Meal Coverage:  -    Novolog 4 units TID with meals HOLD if pt NPO HOLD if pt eats <50% meals   Addendum 2:27 pm: Spoke with pt, wife, and daughter at bedside regarding A1c of 12.7% and glucose control at home. Pt reports doing a fingerstick every morning and trends do not look that bad. Pt on PO prednisone 10 mg at home as well.   According to glucose trends in the hospital pt trends at goal in the morning, however, after prednisone dose and meal intake trends easily go into the 300-400 range. This maybe what is happening at home. Pt only checks am glucose so is unaware of trends. Pt foes not have much of an appetite due to not tasting anything. Family is choosing low carb options but when looking is choosing things high in sodium. I spoke to them about both low sodium and carbohydrate food and beverage options emphasizing cooking fresh and using more herbal ingredients for flavors instead of sauces. Family interested in CGM. Pt had tried it before and did not like it saying it didn't work because of the alarms, however, it was probably reading how high his glucose spiked after  prednisone dose at home. Will follow up with pt and family tomorrow regarding CGM and review of information.  Copay for CGM: FSL3 $0  Does not have smart phone needs reader!!!  --Will follow patient during hospitalization--  Christena Deem RN, MSN, BC-ADM Inpatient Diabetes Coordinator Team Pager [phone] (8a-5p)

## 2023-10-23 NOTE — Progress Notes (Signed)
 Occupational Therapy Treatment Patient Details Name: Todd [name] MRN: [id number] DOB: 11-Oct-1940 Today's Date: 10/23/2023   History of present illness Pt is 83 yo male presented on 10/18/23 with progressive weakness and shortness of breath and worsening LE edema.  Pt found to bed COVID +.  Pt with hx including but not limited to A.fib on Xarelto, DM2, pulmonary fibrosis, HTN, HLD, DM2, CAD sp CABG   OT comments  Pt progressing towards OT goals this session. Pt on 2L O2 throughout session with SpO2 >90% with activity and at rest. HR improved today. Unsustained episodes of Vtach, HR in 80's at rest and up to 104 with activity (short in room mobility). Required seated position for sink level grooming and rear peri care from earlier BM. POC remains appropriate (HHOT). OT will continue to follow to maximize safety and independence in ADL and functional transfers.       If plan is discharge home, recommend the following:  A little help with walking and/or transfers;Assistance with cooking/housework;A lot of help with bathing/dressing/bathroom   Equipment Recommendations  BSC/3in1    Recommendations for Other Services PT consult    Precautions / Restrictions Precautions Precautions: Fall Recall of Precautions/Restrictions: Intact Precaution/Restrictions Comments: COVID+ Restrictions Weight Bearing Restrictions Per Provider Order: No       Mobility Bed Mobility Overal bed mobility: Needs Assistance Bed Mobility: Sit to Supine       Sit to supine: Min assist, Used rails, HOB elevated   General bed mobility comments: extra time required, assist for RLE    Transfers Overall transfer level: Needs assistance Equipment used: Rolling walker (2 wheels) Transfers: Sit to/from Stand Sit to Stand: Min assist           General transfer comment: Min assist for rise from recliner, pt with good hand placement.     Balance Overall balance assessment: Needs assistance Sitting-balance  support: No upper extremity supported Sitting balance-Leahy Scale: Good     Standing balance support: During functional activity, Bilateral upper extremity supported, Reliant on assistive device for balance Standing balance-Leahy Scale: Fair Standing balance comment: RW to ambulate but could static stand without support for brief time                           ADL either performed or assessed with clinical judgement   ADL Overall ADL's : Needs assistance/impaired     Grooming: Wash/dry face;Oral care;Set up;Sitting Grooming Details (indicate cue type and reason): unable to maintain standing due to burning in legs     Lower Body Bathing: Moderate assistance;Sit to/from stand;Sitting/lateral leans Lower Body Bathing Details (indicate cue type and reason): assist for rear peri care wiping Upper Body Dressing : Sitting;Contact guard assist Upper Body Dressing Details (indicate cue type and reason): additional gown     Toilet Transfer: Contact guard assist;Ambulation;Rolling walker (2 wheels) Toilet Transfer Details (indicate cue type and reason): increased time for moblity         Functional mobility during ADLs: Contact guard assist;Rolling walker (2 wheels) General ADL Comments: Pt with coughing attack x2    Extremity/Trunk Assessment Upper Extremity Assessment Upper Extremity Assessment: Generalized weakness            Vision       Perception     Praxis     Communication Communication Communication: Impaired Factors Affecting Communication: Hearing impaired   Cognition Arousal: Alert Behavior During Therapy: WFL for tasks assessed/performed Cognition: No apparent impairments  Following commands: Intact        Cueing   Cueing Techniques: Verbal cues  Exercises      Shoulder Instructions       General Comments Pt HR initially mid 80's, up to 104 (at the highest) with mobility to sink around the  bed. Seated grooming tasks where HR returned to mid 80's and then back up to low 100's with short ambulation in room    Pertinent Vitals/ Pain       Pain Assessment Pain Assessment: No/denies pain Pain Intervention(s): Monitored during session, Repositioned  Home Living                                          Prior Functioning/Environment              Frequency  Min 2X/week        Progress Toward Goals  OT Goals(current goals can now be found in the care plan section)  Progress towards OT goals: Progressing toward goals  Acute Rehab OT Goals Patient Stated Goal: go home, get better OT Goal Formulation: With patient/family Time For Goal Achievement: 11/03/23 Potential to Achieve Goals: Good  Plan      Co-evaluation                 AM-PAC OT "6 Clicks" Daily Activity     Outcome Measure   Help from another person eating meals?: None Help from another person taking care of personal grooming?: A Little Help from another person toileting, which includes using toliet, bedpan, or urinal?: A Little Help from another person bathing (including washing, rinsing, drying)?: A Lot Help from another person to put on and taking off regular upper body clothing?: A Little Help from another person to put on and taking off regular lower body clothing?: A Lot 6 Click Score: 17    End of Session Equipment Utilized During Treatment: Gait belt;Rolling walker (2 wheels);Oxygen (2L)  OT Visit Diagnosis: Unsteadiness on feet (R26.81);Other abnormalities of gait and mobility (R26.89);Other (comment) (cardiopulmonary status limiting activity)   Activity Tolerance Patient tolerated treatment well   Patient Left in bed;with call bell/phone within reach;with bed alarm set;with family/visitor present   Nurse Communication Mobility status        Time: 0272-5366 OT Time Calculation (min): 35 min  Charges: OT General Charges $OT Visit: 1 Visit OT  Treatments $Self Care/Home Management : 8-22 mins $Therapeutic Activity: 8-22 mins  Nyoka Cowden OTR/L Acute Rehabilitation Services Office: [phone]  Evern Bio [facility] 10/23/2023, 1:00 PM

## 2023-10-23 NOTE — Progress Notes (Signed)
 Heart Failure Nurse Navigator Progress Note  PCP: [name], MD PCP-Cardiologist: Hochrein Admission Diagnosis: Acute on chronic congestive heart failure, General weakness.  Admitted from: Home  Presentation:   Todd [name] presented with shortness of breath, BLE edema, weakness, decrease appetite, weight loss, elbow pain. BP 125/75, HR 61, BNP 738, CBG >600, EKG showed A-fib with RVR. Covid + on admission, Per cardiology he was seen by Dr. Lalla Brothers and felt to be a good candidate for Tikosyn and Watchman device.Patient agreeable, and will proceed with evaluation.   Patient / wife, and daughter were educated on the sign and symptoms of heart failure, daily weights, when to call his doctor or go tot he ED, Diet/ fluid restrictions, per family patient likes to drink Coke Zero's and Dr. Reino Kent. Continued education on taking all medications as prescribed and attending all medical appointments. Patient and family verbalized their understanding of education, A HF TOC appointment was scheduled for 11/01/2023 @ 10:15 am.   ECHO/ LVEF: 55-60%  Clinical Course:  Past Medical History:  Diagnosis Date   Allergic rhinitis    Asthma    Atrial fibrillation with rapid ventricular response (HCC)    a. newly diagnosed 11/03/13, spont conv to NSR, placed on xarelto.   CAD (coronary artery disease)    a. 1999; s/p CABG: LIMA to LAD, SVG to OM1, SVG to OM2, SVG to RCA  b. 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   Tobacco Use   Smoking status: Never   Smokeless tobacco: Never  Vaping Use   Vaping status: Never Used  Substance and Sexual Activity   Alcohol use: No    Alcohol/week: 0.0 standard drinks of alcohol   Drug use: No   Sexual activity: Yes  Other Topics Concern   Not on file  Social History Narrative   Right handed    Social Drivers of Health   Financial Resource Strain: Low Risk  (08/03/2023)   Overall Financial Resource Strain (CARDIA)    Difficulty of Paying Living Expenses: Not hard at all  Food Insecurity: No Food Insecurity (10/19/2023)   Hunger Vital Sign    Worried About Running Out of Food in the Last Year: Never true    Ran Out of Food in the Last Year: Never true  Transportation Needs: No Transportation Needs (10/19/2023)   PRAPARE - Transportation    Lack of Transportation (Medical): No    Lack of Transportation (Non-Medical): No  Physical Activity: Inactive (08/03/2023)   Exercise Vital Sign    Days of Exercise per Week: 0 days    Minutes of Exercise per Session: 0 min  Stress: No Stress Concern Present (08/03/2023)   [employer] of Occupational Health - Occupational Stress Questionnaire    Feeling of Stress : Not at all  Social Connections: Moderately Integrated (10/19/2023)   Social Connection and Isolation Panel [NHANES]  Frequency of Communication with Friends and Family: More than three times a week    Frequency of Social Gatherings with Friends and Family: Twice a week    Attends Religious Services: More than 4 times per year    Active Member of [employer] or Organizations: No    Attends Engineer, structural: Never    Marital Status: Married   Water engineer and Provision:  Detailed education and instructions provided on heart failure disease management including the following:  Signs and symptoms of Heart Failure When to call the physician Importance of daily weights Low sodium diet Fluid restriction Medication  management Anticipated future follow-up appointments  Patient education given on each of the above topics.  Patient acknowledges understanding via teach back method and acceptance of all instructions.  Education Materials:  "Living Better With Heart Failure" Booklet, HF zone tool, & Daily Weight Tracker Tool.  Patient has scale at home: yes Patient has pill box at home: yes, wife makes them     High Risk Criteria for Readmission and/or Poor Patient Outcomes: Heart failure hospital admissions (last 6 months): 1  No Show rate: 2% Difficult social situation: No, lives with his wife.  Demonstrates medication adherence: yes Primary Language: English ( HOH) Literacy level: , Reading, writing, and comprehension  Barriers of Care:   Diet/ fluid restrictions ( soda)  Daily weights  Considerations/Referrals:   Referral made to Heart Failure Pharmacist Stewardship: Yes Referral made to Heart Failure CSW/NCM TOC: No Referral made to Heart & Vascular TOC clinic: Yes, 11/01/2023 @ 10:15 am  Items for Follow-up on DC/TOC: Continued HF education Diet/ fluid restrictions/ daily weights   Rhae Hammock, BSN, RN Heart Failure Teacher, adult education Only

## 2023-10-23 NOTE — Progress Notes (Addendum)
 Rounding Note    Patient Name: Todd [name] Date of Encounter: 10/23/2023  [facility] Cardiologist: Rollene Rotunda, MD   Subjective   Denies any CP or SOB. HR 100-120s overnight.  Inpatient Medications    Scheduled Meds:  atorvastatin  80 mg Oral QHS   feeding supplement (GLUCERNA SHAKE)  237 mL Oral TID BM   furosemide  40 mg Oral Daily   guaiFENesin  600 mg Oral BID   insulin aspart  0-15 Units Subcutaneous TID WC   insulin glargine  16 Units Subcutaneous QHS   metoprolol tartrate  100 mg Oral BID   multivitamin with minerals  1 tablet Oral Daily   potassium chloride  20 mEq Oral Once   predniSONE  10 mg Oral Q breakfast   rivaroxaban  20 mg Oral Q supper   sodium chloride flush  3 mL Intravenous Q12H   Continuous Infusions:  magnesium sulfate bolus IVPB     PRN Meds: acetaminophen **OR** acetaminophen, albuterol, HYDROcodone-acetaminophen, metoprolol tartrate, ondansetron **OR** ondansetron (ZOFRAN) IV, sodium chloride flush, traZODone   Vital Signs    Vitals:   10/22/23 1932 10/22/23 2316 10/23/23 0418 10/23/23 0500  BP: 106/77 111/69 108/62   Pulse: 91 91 88   Resp: (!) 24 (!) 28 (!) 23   Temp: 98.5 F (36.9 C) 98.2 F (36.8 C) (!) 97.4 F (36.3 C)   TempSrc: Oral Oral Oral   SpO2: 96% 96% 99%   Weight:    65.4 kg  Height:        Intake/Output Summary (Last 24 hours) at 10/23/2023 0900 Last data filed at 10/23/2023 0400 Gross per 24 hour  Intake --  Output 1550 ml  Net -1550 ml      10/23/2023    5:00 AM 10/22/2023    5:00 AM 10/20/2023    5:00 AM  Last 3 Weights  Weight (lbs) 144 lb 2.9 oz 144 lb 6.4 oz 140 lb 3.4 oz  Weight (kg) 65.4 kg 65.5 kg 63.6 kg      Telemetry    Atrial fibrillation with HR 100-120s - Personally Reviewed  ECG    Atrial fibrillation with RVR - Personally Reviewed  Physical Exam   GEN: No acute distress.   Neck: No JVD Cardiac: irregularly irregular, no murmurs, rubs, or gallops.  Respiratory: Clear to  auscultation bilaterally. GI: Soft, nontender, non-distended  MS: No edema; No deformity. Neuro:  Nonfocal  Psych: Normal affect   Labs    High Sensitivity Troponin:   Recent Labs  Lab 10/18/23 1141 10/18/23 1820 10/18/23 2349 10/19/23 0432  TROPONINIHS 62* 57* 65* 99*     Chemistry Recent Labs  Lab 10/18/23 1141 10/18/23 2349 10/19/23 0432 10/19/23 2056 10/21/23 0534 10/22/23 0530 10/23/23 0518  NA 133*   < > 140   < > 141 138 137  K 4.4   < > 3.9   < > 4.0 4.1 3.9  CL 96*   < > 99   < > 97* 100 100  CO2 29   < > 29   < > 29 31 30   GLUCOSE 507*   < > 66*   < > 119* 134* 172*  BUN 34*   < > 29*   < > 23 31* 34*  CREATININE 0.98   < > 0.74   < > 0.62 0.58* 0.61  CALCIUM 8.9   < > 9.2   < > 9.8 8.8* 8.9  MG  --    < >  2.9*  --  1.7 1.8 1.8  PROT 6.6  --  6.5  --   --   --   --   ALBUMIN 2.3*  --  2.2*  --   --   --   --   AST 38  --  34  --   --   --   --   ALT 51*  --  45*  --   --   --   --   ALKPHOS 87  --  83  --   --   --   --   BILITOT 0.8  --  0.7  --   --   --   --   GFRNONAA >60   < > >60   < > >60 >60 >60  ANIONGAP 8   < > 12   < > 15 7 7    < > = values in this interval not displayed.    Lipids No results for input(s): "CHOL", "TRIG", "HDL", "LABVLDL", "LDLCALC", "CHOLHDL" in the last 168 hours.  Hematology Recent Labs  Lab 10/19/23 0432 10/20/23 0526 10/21/23 0534  WBC 10.3 7.8 8.4  RBC 3.51* 3.42* 3.56*  HGB 12.1* 11.7* 12.2*  HCT 34.4* 34.1* 35.7*  MCV 98.0 99.7 100.3*  MCH 34.5* 34.2* 34.3*  MCHC 35.2 34.3 34.2  RDW 16.8* 16.6* 16.6*  PLT 262 236 252   Thyroid No results for input(s): "TSH", "FREET4" in the last 168 hours.  BNP Recent Labs  Lab 10/18/23 1135  BNP 738.0*    DDimer No results for input(s): "DDIMER" in the last 168 hours.   Radiology    No results found.  Cardiac Studies   Echo 10/19/2023  1. Left ventricular ejection fraction, by estimation, is 45%. The left  ventricle has mildly decreased function. The left  ventricle demonstrates  global hypokinesis. There is mild concentric left ventricular hypertrophy.  Left ventricular diastolic  parameters are indeterminate.   2. Right ventricular systolic function is mildly reduced. The right  ventricular size is mildly enlarged. There is normal pulmonary artery  systolic pressure. The estimated right ventricular systolic pressure is  33.3 mmHg.   3. Left atrial size was mildly dilated.   4. The mitral valve is degenerative. Mild mitral valve regurgitation. No  evidence of mitral stenosis. Moderate mitral annular calcification.   5. The aortic valve is tricuspid. There is moderate calcification of the  aortic valve. Aortic valve regurgitation is not visualized. Aortic valve  sclerosis/calcification is present, without any evidence of aortic  stenosis.   6. The inferior vena cava is dilated in size with <50% respiratory  variability, suggesting right atrial pressure of 15 mmHg.   7. The patient was in atrial fibrillation.   Patient Profile     83 y.o. male with a hx of persistent atrial fibrillation, hypertension, hyperlipidemia, DM2, CAD s/p CABG 1999 and history of pulmonary fibrosis who is being seen 10/22/2023 for the evaluation of atrial fibrillation   Assessment & Plan    Persistent atrial fibrillation             -Last time his EKG was in sinus rhythm was in August 2024.  He was in atrial fibrillation on EKG in September 2024.  He likely has been in persistent atrial fibrillation since then.  He was seen by Dr. Lalla Brothers and felt to be a good candidate for Tikosyn and Watchman device.  He was agreeable to proceed with Watchman device and is currently undergoing evaluation.             -  He is hesitant to take Xarelto long-term due to easy bruising and bleeding risk.  However he has been compliant with Xarelto in the past few months.             -He is not a good candidate for amiodarone due to history of pulmonary fibrosis.             -although Dr.  Lovena Neighbours previous note mention patient is very symptomatic and does not do well in A-fib, however patient cardiac awareness has decreased and now does not feel the A-fib at all.   -plan to pursue rate control and consider outpatient DCCV after 3 weeks once recovered from COVID. HR overnight around 100, but jump up to 150s when getting up. Discussed with Dr. Bjorn Pippin, will load with digoxin via pharmacy consult.    Acute systolic heart failure: EF 45% on echocardiogram 10/11/2023.  Previous EF 55 to 60% on 01/08/2023.  Likely related to persistent atrial fibrillation since September.  He received IV diuresis on admission, now appears euvolemic   COVID-positive: Already on steroid therapy for pulmonary fibrosis, therefore did not start on antiviral.  Currently on 1 L oxygen.   Lower extremity edema: Resolved.  Likely multifactorial due to steroid use, HFmrEF +/- venous stasis   CAD s/p CABG 1999: serial troponin borderline elevated, however remained flat.  This is inconsistent with ACS.  Patient denies any recent chest pain.  Last cardiac catheterization in 2024 showed patent grafts, however SVG to OM had 65% lesion in the graft.   History of pulmonary fibrosis: On chronic steroid therapy.   Hypertension: On 100 mg twice daily on metoprolol tartrate for rate control.   Hyperlipidemia DM2      For questions or updates, please contact [facility] Please consult [email or web address] for contact info under        Signed, Azalee Course, PA  10/23/2023, 9:00 AM    Patient seen and examined.  Agree with above documentation.  On exam, patient is alert and oriented, irregular rhythm, normal rate, no murmurs, lungs CTAB, no LE edema or JVD.  Elevated rates overnight and this morning in A-fib.  Started digoxin load.  Rate significantly proved this afternoon, currently in 70s.  Continue Lopressor and digoxin.  Plan outpatient cardioversion once completes 3 weeks of anticoagulation  Little Ishikawa, MD

## 2023-10-23 NOTE — Progress Notes (Signed)
   10/23/23 1008  Mobility  Activity Transferred from bed to chair  Level of Assistance Minimal assist, patient does 75% or more  Assistive Device Front wheel walker  Activity Response Tolerated fair  Mobility Referral Yes  Mobility visit 1 Mobility  Mobility Specialist Start Time (ACUTE ONLY) [id number]  Mobility Specialist Stop Time (ACUTE ONLY) 1008  Mobility Specialist Time Calculation (min) (ACUTE ONLY) 26 min   Mobility Specialist: Progress Note  Pre-Mobility:      HR 120s, SpO2 96% 1L During Mobility: HR 140s Post-Mobility:    HR 110s, SpO297% 1L   Pt agreeable to mobility session - received in bed. C/o pain in tail bone, bottom area. Returned to chair with all needs met - call bell within reach. Chair alarm on.   Barnie Mort, BS Mobility Specialist Please contact via SecureChat or  Rehab office at [phone].

## 2023-10-23 NOTE — Progress Notes (Addendum)
 Nutrition Follow-up  DOCUMENTATION CODES:   Not applicable  INTERVENTION:  Advance diet as medically applicable Glucerna Shake po TID, each supplement provides 220 kcal and 10 grams of protein Continue MVI   NUTRITION DIAGNOSIS:   Increased nutrient needs related to acute illness as evidenced by estimated needs.    GOAL:   Patient will meet greater than or equal to 90% of their needs    MONITOR:   PO intake  REASON FOR ASSESSMENT:   Consult Assessment of nutrition requirement/status, Diet education  ASSESSMENT:   83 year old male history of peripheral vascular disease, CAD, atrial fibrillation currently on Xarelto, diabetes, hypertension accompanied by daughter to the ER with concerns of weakness. Pt was instructed by PCP to come to the ED on 2/27 for bilateral weakness in his legs that has been progressively worsening since 2020 but has now worsened to limit activities of daily living and cause stumbling since Dec 2024. Family reports general health has worsened since 2020 when patient had COVID including decreased appetite, weight loss (~80lbs), chronic cough, and weakness presenting for today. In addition to leg weakness, patient reports pain in right elbow after feeling a "pop" upon waking two weeks ago; sacral ulcer (stage 2); swelling of the lower legs; intermittent SOB; and intermittent periumbilical pain. Patient denies nausea/vomiting/fever. Patient denies current chest pain or SOB.  No family in room at time of first visit, patient a sleep and unable to wake. RN reached out family wanting to talk to RD. Therapy just finishing up with patient when RD arrived. Family had many questions about what he can and cannot eat, Provided handouts and verbal education.  Patient stated that he did not like the food. Family bringing outside food.  Family reports he ate most of his breakfast.   Significant events: 2/27>> admit to [facility] 3/01>> A-fib RVR   Significant  studies: 2/27>> CXR: Coarse interstitial lung disease  2/28>> echo: EF 45%, global hypokinesis.   Significant microbiology data: 2/27>> COVID PCR: Positive 2/27>> influenza A/influenza B/RSV PCR: Negative Admit weight: 63.6 kg Hospital weight history:  10/23/23 0500 65.4 kg 144.18 lbs  10/22/23 0500 65.5 kg 144.4 lbs  10/20/23 0500 63.6 kg 140.21 lb    Average Meal Intake: No documentation.   Nutritionally Relevant Medications: Scheduled Meds:  feeding supplement (GLUCERNA SHAKE)  237 mL Oral TID BM   furosemide  40 mg Oral Daily   guaiFENesin  600 mg Oral BID   insulin aspart  0-15 Units Subcutaneous TID WC   insulin glargine  16 Units Subcutaneous QHS   multivitamin with minerals  1 tablet Oral Daily   potassium chloride  20 mEq Oral Once    Labs Reviewed:  CBG ranges from 172-134 mg/dL over the last 24 hours HgbA1c 12.7    NUTRITION - FOCUSED PHYSICAL EXAM:    Diet Order:   Diet Order             DIET DYS 3 Room service appropriate? Yes with Assist; Fluid consistency: Thin; Fluid restriction: 1500 mL Fluid  Diet effective now                   EDUCATION NEEDS:   Not appropriate for education at this time  Skin:  Skin Assessment: Reviewed RN Assessment  Last BM:  10/21/23  Height:   Ht Readings from Last 1 Encounters:  10/19/23 5\' 6"  (1.676 m)    Weight:   Wt Readings from Last 1 Encounters:  10/23/23 65.4 kg  Ideal Body Weight:     BMI:  Body mass index is 23.27 kg/m.  Estimated Nutritional Needs:   Kcal:  1910-2230 kcal  Protein:  85-105 g  Fluid:  35ml/kcal    Jamelle Haring RDN, LDN Clinical Dietitian   If unable to reach, please contact "RD Inpatient" secure chat group between 8 am-4 pm daily"

## 2023-10-24 ENCOUNTER — Telehealth (HOSPITAL_COMMUNITY): Payer: Self-pay

## 2023-10-24 ENCOUNTER — Other Ambulatory Visit (HOSPITAL_COMMUNITY): Payer: Self-pay

## 2023-10-24 DIAGNOSIS — J9601 Acute respiratory failure with hypoxia: Secondary | ICD-10-CM | POA: Diagnosis not present

## 2023-10-24 DIAGNOSIS — I4891 Unspecified atrial fibrillation: Secondary | ICD-10-CM | POA: Diagnosis not present

## 2023-10-24 DIAGNOSIS — I5021 Acute systolic (congestive) heart failure: Secondary | ICD-10-CM | POA: Diagnosis not present

## 2023-10-24 LAB — URINALYSIS, ROUTINE W REFLEX MICROSCOPIC
Bilirubin Urine: NEGATIVE
Glucose, UA: NEGATIVE mg/dL
Hgb urine dipstick: NEGATIVE
Ketones, ur: NEGATIVE mg/dL
Leukocytes,Ua: NEGATIVE
Nitrite: NEGATIVE
Protein, ur: NEGATIVE mg/dL
Specific Gravity, Urine: 1.015 (ref 1.005–1.030)
pH: 5 (ref 5.0–8.0)

## 2023-10-24 LAB — BASIC METABOLIC PANEL
Anion gap: 11 (ref 5–15)
BUN: 42 mg/dL — ABNORMAL HIGH (ref 8–23)
CO2: 27 mmol/L (ref 22–32)
Calcium: 8.8 mg/dL — ABNORMAL LOW (ref 8.9–10.3)
Chloride: 99 mmol/L (ref 98–111)
Creatinine, Ser: 0.67 mg/dL (ref 0.61–1.24)
GFR, Estimated: >60 mL/min (ref >60)
Glucose, Bld: 203 mg/dL — ABNORMAL HIGH (ref 70–99)
Potassium: 4.3 mmol/L (ref 3.5–5.1)
Sodium: 137 mmol/L (ref 135–145)

## 2023-10-24 LAB — GLUCOSE, CAPILLARY
Glucose-Capillary: 200 mg/dL — ABNORMAL HIGH (ref 70–99)
Glucose-Capillary: 171 mg/dL — ABNORMAL HIGH (ref 70–99)
Glucose-Capillary: 268 mg/dL — ABNORMAL HIGH (ref 70–99)
Glucose-Capillary: 237 mg/dL — ABNORMAL HIGH (ref 70–99)
Glucose-Capillary: 251 mg/dL — ABNORMAL HIGH (ref 70–99)

## 2023-10-24 LAB — MAGNESIUM: Magnesium: 2 mg/dL (ref 1.7–2.4)

## 2023-10-24 MED ORDER — FREESTYLE LIBRE 3 SENSOR MISC: 1.0000 | Freq: Three times a day (TID) | 0 refills | Status: DC

## 2023-10-24 MED ORDER — INSULIN ASPART 100 UNIT/ML FLEXPEN: 4.0000 [IU] | PEN_INJECTOR | Freq: Three times a day (TID) | SUBCUTANEOUS | 0 refills | Status: DC

## 2023-10-24 MED ORDER — FREESTYLE LIBRE 3 READER DEVI
1.0000 | Freq: Three times a day (TID) | 0 refills | Status: DC
Start: 2023-10-24 — End: 2024-04-28

## 2023-10-24 MED ORDER — LANTUS SOLOSTAR 100 UNIT/ML ~~LOC~~ SOPN
20.0000 [IU] | PEN_INJECTOR | Freq: Every day | SUBCUTANEOUS | Status: DC
Start: 2023-10-24 — End: 2023-10-29

## 2023-10-24 MED ORDER — DIGOXIN 125 MCG PO TABS: 0.1250 mg | ORAL_TABLET | Freq: Every day | ORAL | 0 refills | Status: DC

## 2023-10-24 MED ORDER — OFEV 100 MG PO CAPS: 100.0000 mg | ORAL_CAPSULE | Freq: Two times a day (BID) | ORAL | 1 refills | Status: DC

## 2023-10-24 MED ORDER — PEN NEEDLES 31G X 5 MM MISC: 1.0000 | Freq: Three times a day (TID) | 0 refills | Status: DC

## 2023-10-24 MED ORDER — GUAIFENESIN ER 600 MG PO TB12: 600.0000 mg | ORAL_TABLET | Freq: Two times a day (BID) | ORAL | 0 refills | Status: AC

## 2023-10-24 NOTE — Telephone Encounter (Addendum)
 Patient is re-approved for Ofev through patient assistance program through 08/20/2024.  Rx sent to [facility] today. Phnoe: [phone]  Patient is currently hospitalized.  Chesley Mires, PharmD, MPH, BCPS, CPP Clinical Pharmacist (Rheumatology and Pulmonology)

## 2023-10-24 NOTE — Progress Notes (Addendum)
 Patient Name: Todd [name] Date of Encounter: 10/24/2023 [facility] Cardiologist: Rollene Rotunda, MD   Interval Summary  .    Patient reports feeling good Denies any chest pain, shortness of breath or palpitations Working with speech therapy when I went into the room, seems to be doing well  Vital Signs .    Vitals:   10/24/23 0400 10/24/23 0500 10/24/23 0800 10/24/23 0923  BP: 100/71  115/69   Pulse: 76  90 99  Resp: (!) 28  11   Temp:   98.2 F (36.8 C)   TempSrc:   Oral   SpO2: 94%  92% 95%  Weight:  65.4 kg    Height:        Intake/Output Summary (Last 24 hours) at 10/24/2023 1040 Last data filed at 10/24/2023 0500 Gross per 24 hour  Intake 280 ml  Output 1250 ml  Net -970 ml      10/24/2023    5:00 AM 10/23/2023    5:00 AM 10/22/2023    5:00 AM  Last 3 Weights  Weight (lbs) 144 lb 2.9 oz 144 lb 2.9 oz 144 lb 6.4 oz  Weight (kg) 65.4 kg 65.4 kg 65.5 kg      Telemetry/ECG    Atrial fibrillation with HR 70-80s, PVCs - Personally Reviewed  Physical Exam .   GEN: No acute distress.   Neck: No JVD Cardiac: irregular rhythm, normal rate, no murmurs, rubs, or gallops.  Respiratory: Clear to auscultation bilaterally. GI: Soft, nontender, non-distended  MS: No edema  Assessment & Plan .    83 y.o. male with a hx of persistent atrial fibrillation, hypertension, hyperlipidemia, DM2, CAD s/p CABG 1999 and history of pulmonary fibrosis who is being seen 10/22/2023 for the evaluation of atrial fibrillation   Persistent atrial fibrillation EKG has not showed sinus rhythm since August 2024 Seen by Dr. Lalla Brothers Currently undergoing evaluation for Watchman device  Reports compliance with Xarelto in the meantime  Poor candidate for amiodarone due to pulmonary fibrosis history  Continue with rate control at this time and consider DCCV once recovered from COVID  Continue digoxin 0.125 mg daily.  Rates appear well controlled Continue Xarelto 20 mg daily -- consider  DCCV after 3 weeks of consistent AC and recovered from COVID infection   Acute systolic heart failure Echo 09/2023 showed: EF 45% (previously 55-60%) likely related to A. Fib  Received IV diuresis -- appears euvolemic today  Continue PO Lasix 40 mg daily  Due to softer BP readings while admitted would consider adding ARB/ARNI +/- MRA in future, if he can tolerate it with BP room  CAD s/p CABG 1999 Patient denies any recent chest pain Flat troponin -- not consistent with ACS LHC 2024 showed patent grafts, SVG to OM had 65% lesion in graft Continue Lipitor 80 mg daily   Hypertension Most recent BP 115/69, HR 90s Continue Lopressor 100 mg BID   Hyperlipidemia 09/19/2023: HDL 38.20; LDL Cholesterol 51 10/19/2023: ALT 45  Continue Lipitor 80 mg daily   Per primary Diabetes History of pulmonary fibrosis  COVID infection   [facility] will sign off.   Medication Recommendations: Digoxin 0.125 mg daily, Lasix 40 mg daily, metoprolol 100 mg twice daily, Xarelto 20 mg daily Other recommendations (labs, testing, etc): Digoxin level, BMET in 1 week Follow up as an outpatient: We will schedule   For questions or updates, please contact  [facility] Please consult [email or web address] for contact info under  Signed, Olena Leatherwood, PA-C   Patient seen and examined.  Agree with above documentation.  On exam, patient is alert and oriented, normal rate, regular rhythm, no murmurs, lungs CTAB, no LE edema or JVD.  Rates appear controlled in A-fib, continue metoprolol and digoxin.  Continue Xarelto for anticoagulation.  Will schedule outpatient follow-up in A-fib clinic to consider cardioversion once recovers from COVID and completes 3 weeks of uninterrupted anticoagulation.  Little Ishikawa, MD

## 2023-10-24 NOTE — Progress Notes (Signed)
 Speech Language Pathology Treatment: Dysphagia  Patient Details Name: Todd [name] MRN: [id number] DOB: 1940-12-16 Today's Date: 10/24/2023 Time: 4098-1191 SLP Time Calculation (min) (ACUTE ONLY): 16 min  Assessment / Plan / Recommendation Clinical Impression  Pt was seen for f/u after advancement to regular textures on previous date. Although mastication is somewhat prolonged, he seems to take the extra time needed to masticate the bolus. He coughed once, but this was quite a while after swallowing and did not necessarily appear to be related. SLP provided education about precautions and gave Min A during intake to help with taking smaller bites at a time. He believes that his regular solids are going well. SLP will leave current diet in place but will still f/u briefly given acute illness on top of baseline dysphagia.   HPI HPI: 83 yo presenting 2/27 with weakness, found to have COVID. While in the ED he had a coughing and vomiting episode. CXR without acute findings. Pt has seen SLP at outside facility with [facility] 03/26/23 showing mild pharyngeal dysphagia likely secondary to generalized deconditioning. Dysphagia was primarily characterized by decreased base of tongue retraction, diminished pharyngeal sensation, and incomplete laryngeal vestibule closure, resulting in mild to moderate post-swallow residual along the base of tongue and valleculae that increased with viscosity and reduced with a secondary swallow. Penetration but no aspiration observed. He had aggressive coughing after the study that was suspicious for possible esophageal issues/referred sensation or possibly related to talking. Pt follows with ENT for chronic cough and is s/p SLN block with f/u with ENT and swallow study reportedly planned for April. PMH also includes: A.fib on Xarelto, DM2, pulmonary fibrosis, HTN, HLD, DM2, CAD s/p CABG, stroke      SLP Plan  Continue with current plan of care      Recommendations for follow up  therapy are one component of a multi-disciplinary discharge planning process, led by the attending physician.  Recommendations may be updated based on patient status, additional functional criteria and insurance authorization.    Recommendations  Diet recommendations: Regular;Thin liquid Liquids provided via: Cup;Straw Medication Administration: Whole meds with liquid Supervision: Patient able to self feed;Full supervision/cueing for compensatory strategies Compensations: Minimize environmental distractions;Slow rate;Small sips/bites;Follow solids with liquid Postural Changes and/or Swallow Maneuvers: Seated upright 90 degrees;Upright 30-60 min after meal                  Oral care BID     Dysphagia, oropharyngeal phase (R13.12)     Continue with current plan of care     Mahala Menghini., M.A. CCC-SLP Acute Rehabilitation Services Office [phone]  Secure chat preferred   10/24/2023, 10:23 AM

## 2023-10-24 NOTE — Discharge Summary (Signed)
 Physician Discharge Summary  Todd [name] ZOX:[id number] DOB: Sep 10, 1940 DOA: 10/18/2023  PCP: [name], MD  Admit date: 10/18/2023 Discharge date: 10/24/2023  Admitted From: Home Disposition: Home with home health  Recommendations for Outpatient Follow-up:  Follow up with PCP in 1-2 weeks Please obtain BMP/CBC in one week Cardiology to schedule follow-up  Home Health: PT/OT.  Outpatient speech therapy. Equipment/Devices: None  Discharge Condition: Stable CODE STATUS: Full code Diet recommendation: Low-salt diet, low-carb diet  Discharge summary: Patient is a 83 y.o.  male with history of pulmonary fibrosis, PAF, HTN, HLD, DM-2, CAD s/p CABG who presented with progressive weakness and shortness of breath-he had an episode of choking in the emergency room-he was subsequently found to have COVID-19 infection and subsequently admitted to the hospitalist service.  Hospital course complicated by A-fib with RVR-see below for further details.   Significant events: 2/27>> admit to [facility] 3/01>> A-fib RVR   Significant studies: 2/27>> CXR: Coarse interstitial lung disease  2/28>> echo: EF 45%, global hypokinesis.   Significant microbiology data: 2/27>> COVID PCR: Positive 2/27>> influenza A/influenza B/RSV PCR: Negative   Procedures: None   Consults: Cardiology  PAF with RVR Remains in A-fib-rate well-controlled at rest.  Patient had tachycardia with mobility.  Seen by cardiology.  Loaded with digoxin and now on maintenance digoxin.  Fair control today. On metoprolol 100 mg twice daily Remains on Xarelto Cardiology recommended to discharge patient on maintenance dose of digoxin and metoprolol.  He will likely need cardioversion as outpatient.   HFmrEF exacerbation Significant improvement in lower extremity edema with diuretics Switched to oral Lasix on 3/3 Remains on oral beta-blockers   COVID-19 infection Supportive care at this point, already on prednisone On room  air.   Interstitial lung disease  History of asbestos exposure CRP not elevated Continue prednisone Continue furosemide-maintain negative balance. Follows with Dr. Isaiah Serge On room air.   Minimally elevated troponins Likely demand ischemia. No chest pain Echo with mildly reduced EF and global hypokinesis Cardiology consulted-await recommendations.   CAD s/p CABG No chest pain Continue statin/beta-blocker Suspect not on ASA as patient on anticoagulation.   DM-2 (A1c 12.7 on 2/27) with uncontrolled hyperglycemia Patient on Lantus 25 units and metformin at home.  Blood sugars persistently elevated.  Also on chronic steroids. Discharging on Lantus 20 units daily, NovoLog 10 units with meals.  Resume metformin.  Patient may need further escalation of doses of insulin.  Patient and family will closely monitor blood sugars and keep a logbook and bring it to doctor's visit.   Oropharyngeal dysphagia Choking episode in the ED on initial presentation Evaluated by SLP-dysphagia 3 diet started which he seems to be tolerating. Will benefit with outpatient speech referral.    Debility/deconditioning PT/OT eval-Home health recommended.  Stable for discharge.  Discharge Diagnoses:  Principal Problem:   Acute respiratory failure with hypoxia (HCC) Active Problems:   Paroxysmal atrial fibrillation (HCC)   Type 2 diabetes, controlled, with peripheral circulatory disorder (HCC)   Elevated troponin   Leg edema   Choking episode   COVID-19 virus infection   Atrial fibrillation with RVR (HCC)   Acute systolic heart failure [facility])    Discharge Instructions  Discharge Instructions     Amb Referral to [facility] Clinic   Complete by: As directed    Diet - low sodium heart healthy   Complete by: As directed    Diet Carb Modified   Complete by: As directed    Discharge wound care:  Complete by: As directed    Protect pressure points.   Increase activity slowly   Complete by: As directed        Allergies as of 10/24/2023       Reactions   Amlodipine Besylate    Rash Because of a history of documented adverse serious drug reaction;Medi Alert bracelet  is recommended   Iodinated Contrast Media Rash   Rash Because of a history of documented adverse serious drug reaction;Medi Alert bracelet  is recommended Rash, Because of a history of documented adverse serious drug reaction;Medi Alert bracelet  is recommended   Metformin And Related Diarrhea        Medication List     TAKE these medications    acetaminophen 650 MG CR tablet Commonly known as: TYLENOL Take 650 mg by mouth every 8 (eight) hours as needed for pain.   atorvastatin 80 MG tablet Commonly known as: LIPITOR Take 1 tablet (80 mg total) by mouth daily. What changed: when to take this   cholecalciferol 25 MCG (1000 UNIT) tablet Commonly known as: VITAMIN D3 Take 1,000 Units by mouth daily.   digoxin 0.125 MG tablet Commonly known as: LANOXIN Take 1 tablet (0.125 mg total) by mouth daily. Start taking on: October 25, 2023   famotidine 40 MG tablet Commonly known as: Pepcid Take 2 tablets (80 mg total) by mouth at bedtime.   FISH OIL PO Take 1 tablet by mouth 2 (two) times daily.   FreeStyle Libre 3 Reader Devi 1 each by Does not apply route 4 (four) times daily -  before meals and at bedtime.   FreeStyle Libre 3 Sensor Misc 1 each by Does not apply route 4 (four) times daily -  before meals and at bedtime. Place 1 sensor on the skin every 14 days. Use to check glucose continuously   furosemide 40 MG tablet Commonly known as: LASIX Take 1 tablet (40 mg total) by mouth daily.   guaiFENesin 600 MG 12 hr tablet Commonly known as: MUCINEX Take 1 tablet (600 mg total) by mouth 2 (two) times daily for 7 days.   insulin aspart 100 UNIT/ML FlexPen Commonly known as: NOVOLOG Inject 4 Units into the skin 3 (three) times daily with meals. Only take if eating a meal AND Blood Glucose (BG) is 80 or  higher.   ipratropium 0.06 % nasal spray Commonly known as: ATROVENT Place 2-4 sprays into both nostrils every 8 (eight) hours as needed for rhinitis.   Lantus SoloStar 100 UNIT/ML Solostar Pen Generic drug: insulin glargine Inject 20 Units into the skin daily. What changed: how much to take   metFORMIN 500 MG 24 hr tablet Commonly known as: GLUCOPHAGE-XR Take 1 tablet (500 mg total) by mouth 2 (two) times daily with a meal.   metoprolol tartrate 100 MG tablet Commonly known as: LOPRESSOR TAKE 1 TABLET BY MOUTH 2 TIMES DAILY.   MULTIVITAMIN PO Take 1 tablet by mouth daily.   nitroGLYCERIN 0.4 MG SL tablet Commonly known as: Nitrostat Place 1 tablet (0.4 mg total) under the tongue every 5 (five) minutes as needed for chest pain.   Ofev 100 MG Caps Generic drug: Nintedanib Take 1 capsule (100 mg total) by mouth 2 (two) times daily.   omeprazole 40 MG capsule Commonly known as: PRILOSEC TAKE 1 CAPSULE BY MOUTH 2 TIMES DAILY.   OneTouch Delica Lancets 33G Misc TEST BLOOD SUGAR ONCE DAILY   OneTouch Verio test strip Generic drug: glucose blood USE UP TO 4  TIMES A DAY AS DIRECTED   Pen Needles 32G X 5 MM Misc UAD for daily lantus injection What changed: Another medication with the same name was added. Make sure you understand how and when to take each.   Pen Needles 31G X 5 MM Misc 1 each by Does not apply route 3 (three) times daily. May dispense any manufacturer covered by patient's insurance. What changed: You were already taking a medication with the same name, and this prescription was added. Make sure you understand how and when to take each.   predniSONE 10 MG tablet Commonly known as: DELTASONE TAKE 1 TABLET (10 MG TOTAL) BY MOUTH DAILY WITH BREAKFAST.   predniSONE 50 MG tablet Commonly known as: DELTASONE Take tablet (50mg ) by mouth 13 hours, 7 hours, 1 hour prior to scan.   VITAMIN B-12 PO Take 1 tablet by mouth daily.   Xarelto 20 MG Tabs  tablet Generic drug: rivaroxaban TAKE 1 TABLET (20 MG TOTAL) BY MOUTH DAILY WITH SUPPER.               Discharge Care Instructions  (From admission, onward)           Start     Ordered   10/24/23 0000  Discharge wound care:       Comments: Protect pressure points.   10/24/23 1333            Follow-up Information     [facility]. Go in 8 day(s).   Specialty: Cardiology Why: Hospital follow up 11/01/2023 @ 10:15 am PLEASE bring a current medication l;ist to appointment FREE valet parking, Entrance C, off [employer] information: [address] [place] [address] [phone]               Allergies  Allergen Reactions   Amlodipine Besylate     Rash Because of a history of documented adverse serious drug reaction;Medi Alert bracelet  is recommended   Iodinated Contrast Media Rash    Rash  Because of a history of documented adverse serious drug reaction;Medi Alert bracelet  is recommended  Rash, Because of a history of documented adverse serious drug reaction;Medi Alert bracelet  is recommended   Metformin And Related Diarrhea    Consultations: Cardiology   Procedures/Studies: VAS [place] LOWER EXTREMITY VENOUS (DVT) Result Date: 10/21/2023  Lower Venous DVT Study Patient Name:  Todd [name]  Date of Exam:   10/20/2023 Medical Rec #: [id number]      Accession #:    [id number] Date of Birth: 01-Oct-1940       Patient Gender: M Patient Age:   5 years Exam Location:  [facility] Procedure:      VAS [place] LOWER EXTREMITY VENOUS (DVT) Referring Phys: Osvaldo Shipper --------------------------------------------------------------------------------  Indications: Covid 19, and Swelling.  Comparison Study: No prior study on file Performing Technologist: Sherren Kerns RVS  Examination Guidelines: A complete evaluation includes B-mode imaging, spectral Doppler, color Doppler, and power Doppler  as needed of all accessible portions of each vessel. Bilateral testing is considered an integral part of a complete examination. Limited examinations for reoccurring indications may be performed as noted. The reflux portion of the exam is performed with the patient in reverse Trendelenburg.  +---------+---------------+---------+-----------+---------------+-------------+ RIGHT    CompressibilityPhasicitySpontaneityProperties     Thrombus  Aging         +---------+---------------+---------+-----------+---------------+-------------+ CFV      Full           Yes      No         pulsatile                                                                waveforms                    +---------+---------------+---------+-----------+---------------+-------------+ SFJ      Full                                                            +---------+---------------+---------+-----------+---------------+-------------+ FV Prox  Full                                                            +---------+---------------+---------+-----------+---------------+-------------+ FV Mid   Full                                                            +---------+---------------+---------+-----------+---------------+-------------+ FV DistalFull                                                            +---------+---------------+---------+-----------+---------------+-------------+ PFV      Full                                                            +---------+---------------+---------+-----------+---------------+-------------+ POP      Full           Yes      No         pulsatile                                                                waveforms                    +---------+---------------+---------+-----------+---------------+-------------+ PTV      Full                                                             +---------+---------------+---------+-----------+---------------+-------------+  PERO     Full                                                            +---------+---------------+---------+-----------+---------------+-------------+ Gastroc  Full                                                            +---------+---------------+---------+-----------+---------------+-------------+   +--------+---------------+---------+-----------+----------------+-------------+ LEFT    CompressibilityPhasicitySpontaneityProperties      Thrombus                                                                 Aging         +--------+---------------+---------+-----------+----------------+-------------+ CFV     Full           Yes      No         pulsatile                                                                waveforms                     +--------+---------------+---------+-----------+----------------+-------------+ SFJ     Full                                                             +--------+---------------+---------+-----------+----------------+-------------+ FV Prox Full                                                             +--------+---------------+---------+-----------+----------------+-------------+ FV Mid  Full                                                             +--------+---------------+---------+-----------+----------------+-------------+ FV      Full                                                             Distal                                                                   +--------+---------------+---------+-----------+----------------+-------------+  PFV     Full                                                             +--------+---------------+---------+-----------+----------------+-------------+ POP     Full           Yes      No         pulsatile                                                                 waveforms                     +--------+---------------+---------+-----------+----------------+-------------+ PTV     Full                                                             +--------+---------------+---------+-----------+----------------+-------------+ PERO    Full                                                             +--------+---------------+---------+-----------+----------------+-------------+     Summary: BILATERAL: - No evidence of deep vein thrombosis seen in the lower extremities, bilaterally. -No evidence of popliteal cyst, bilaterally.   *See table(s) above for measurements and observations. Electronically signed by Coral Else MD on 10/21/2023 at 10:43:15 AM.    Final    ECHOCARDIOGRAM COMPLETE Result Date: 10/19/2023    ECHOCARDIOGRAM REPORT   Patient Name:   Todd [name] Date of Exam: 10/19/2023 Medical Rec #:  [id number]     Height:       66.0 in Accession #:    [id number]    Weight:       138.0 lb Date of Birth:  [birth year]      BSA:          1.708 m Patient Age:    82 years      BP:           100/50 mmHg Patient Gender: M             HR:           100 bpm. Exam Location:  Inpatient Procedure: 2D Echo (Both Spectral and Color Flow Doppler were utilized during            procedure). Indications:    R06.9 DOE  History:        Patient has prior history of Echocardiogram examinations, most                 recent 01/08/2023. CAD, PAD and Carotid Disease,                 Signs/Symptoms:Edema; Risk Factors:Dyslipidemia, Diabetes,  Hypertension and Non-Smoker.  Sonographer:    Dondra Prader RVT Referring Phys: [id number] ANASTASSIA DOUTOVA  Sonographer Comments: Patient coughing throughout exam. HR between 90 bpm - 125 bpm IMPRESSIONS  1. Left ventricular ejection fraction, by estimation, is 45%. The left ventricle has mildly decreased function. The left ventricle demonstrates global hypokinesis. There is mild concentric left  ventricular hypertrophy. Left ventricular diastolic parameters are indeterminate.  2. Right ventricular systolic function is mildly reduced. The right ventricular size is mildly enlarged. There is normal pulmonary artery systolic pressure. The estimated right ventricular systolic pressure is 33.3 mmHg.  3. Left atrial size was mildly dilated.  4. The mitral valve is degenerative. Mild mitral valve regurgitation. No evidence of mitral stenosis. Moderate mitral annular calcification.  5. The aortic valve is tricuspid. There is moderate calcification of the aortic valve. Aortic valve regurgitation is not visualized. Aortic valve sclerosis/calcification is present, without any evidence of aortic stenosis.  6. The inferior vena cava is dilated in size with <50% respiratory variability, suggesting right atrial pressure of 15 mmHg.  7. The patient was in atrial fibrillation. FINDINGS  Left Ventricle: Left ventricular ejection fraction, by estimation, is 45%. The left ventricle has mildly decreased function. The left ventricle demonstrates global hypokinesis. The left ventricular internal cavity size was normal in size. There is mild concentric left ventricular hypertrophy. Left ventricular diastolic parameters are indeterminate. Right Ventricle: The right ventricular size is mildly enlarged. No increase in right ventricular wall thickness. Right ventricular systolic function is mildly reduced. There is normal pulmonary artery systolic pressure. The tricuspid regurgitant velocity  is 2.14 m/s, and with an assumed right atrial pressure of 15 mmHg, the estimated right ventricular systolic pressure is 33.3 mmHg. Left Atrium: Left atrial size was mildly dilated. Right Atrium: Right atrial size was normal in size. Pericardium: There is no evidence of pericardial effusion. Mitral Valve: The mitral valve is degenerative in appearance. Moderate mitral annular calcification. Mild mitral valve regurgitation. No evidence of mitral  valve stenosis. Tricuspid Valve: The tricuspid valve is normal in structure. Tricuspid valve regurgitation is mild. Aortic Valve: The aortic valve is tricuspid. There is moderate calcification of the aortic valve. Aortic valve regurgitation is not visualized. Aortic valve sclerosis/calcification is present, without any evidence of aortic stenosis. Aortic valve mean gradient measures 2.3 mmHg. Aortic valve peak gradient measures 3.9 mmHg. Aortic valve area, by VTI measures 1.61 cm. Pulmonic Valve: The pulmonic valve was normal in structure. Pulmonic valve regurgitation is not visualized. Aorta: The aortic root is normal in size and structure. Venous: The inferior vena cava is dilated in size with less than 50% respiratory variability, suggesting right atrial pressure of 15 mmHg. IAS/Shunts: No atrial level shunt detected by color flow Doppler.  LEFT VENTRICLE PLAX 2D LVIDd:         3.10 cm LVIDs:         2.60 cm LV PW:         1.00 cm LV IVS:        1.50 cm LVOT diam:     2.10 cm LV SV:         29 LV SV Index:   17 LVOT Area:     3.46 cm  RIGHT VENTRICLE            IVC RV Basal diam:  3.30 cm    IVC diam: 2.10 cm RV S prime:     8.81 cm/s TAPSE (M-mode): 1.5 cm LEFT ATRIUM  Index        RIGHT ATRIUM           Index LA Vol (A2C):   47.4 ml 27.75 ml/m  RA Area:     14.20 cm LA Vol (A4C):   46.5 ml 27.22 ml/m  RA Volume:   32.10 ml  18.79 ml/m LA Biplane Vol: 49.8 ml 29.16 ml/m  AORTIC VALVE                    PULMONIC VALVE AV Area (Vmax):    1.75 cm     PV Vmax:       0.74 m/s AV Area (Vmean):   1.69 cm     PV Peak grad:  2.2 mmHg AV Area (VTI):     1.61 cm AV Vmax:           98.60 cm/s AV Vmean:          68.067 cm/s AV VTI:            0.183 m AV Peak Grad:      3.9 mmHg AV Mean Grad:      2.3 mmHg LVOT Vmax:         49.75 cm/s LVOT Vmean:        33.175 cm/s LVOT VTI:          0.085 m LVOT/AV VTI ratio: 0.46  AORTA Ao Root diam: 3.10 cm Ao Asc diam:  3.50 cm MITRAL VALVE                TRICUSPID  VALVE MV Area (PHT): 4.39 cm     TR Peak grad:   18.3 mmHg MV Decel Time: 173 msec     TR Vmax:        214.00 cm/s MV E velocity: 116.00 cm/s MV A velocity: 39.10 cm/s   SHUNTS MV E/A ratio:  2.97         Systemic VTI:  0.08 m                             Systemic Diam: 2.10 cm Dalton McleanMD Electronically signed by Wilfred Lacy Signature Date/Time: 10/19/2023/12:11:19 PM    Final    DG Chest 2 View Result Date: 10/18/2023 CLINICAL DATA:  [id number] Cough in adult [id number] EXAM: CHEST - 2 VIEW COMPARISON:  06/06/2023 FINDINGS: Heart size that is post sternotomy and CABG. Aortic atherosclerosis. Extensive coarsened interstitial markings compatible with none interstitial lung disease. No definite superimposed airspace opacity. No pleural effusion or pneumothorax. IMPRESSION: Chronic interstitial lung disease without superimposed airspace opacity. Electronically Signed   By: Duanne Guess D.O.   On: 10/18/2023 15:42   DG Elbow Complete Right Result Date: 10/18/2023 CLINICAL DATA:  Skin infection EXAM: RIGHT ELBOW - COMPLETE 3+ VIEW COMPARISON:  None Available. FINDINGS: There is no evidence of fracture, dislocation, or joint effusion. No erosion or periosteal elevation. There is no evidence of arthropathy or other focal bone abnormality. Soft tissue swelling of the posterior elbow and proximal forearm. No soft tissue air. IMPRESSION: Soft tissue swelling of the posterior elbow and proximal forearm. No acute osseous abnormality. Electronically Signed   By: Duanne Guess D.O.   On: 10/18/2023 15:41   (Echo, Carotid, EGD, Colonoscopy, ERCP)    Subjective: Patient seen and examined.  He was hurting on his buttock sitting in the chair but denies any complaints.  Denies any nausea vomiting chest pain or palpitations.  On room air.  Comfortable with plan to go home.  Telemetry monitor with A-fib but mostly rate less than 110.   Discharge Exam: Vitals:   10/24/23 1121 10/24/23 1222  BP:    Pulse: 79    Resp: 13   Temp:  (P) 98.5 F (36.9 C)  SpO2:     Vitals:   10/24/23 0800 10/24/23 0923 10/24/23 1121 10/24/23 1222  BP: 115/69     Pulse: 90 99 79   Resp: 11  13   Temp: 98.2 F (36.8 C)   (P) 98.5 F (36.9 C)  TempSrc: Oral   (P) Oral  SpO2: 92% 95%    Weight:      Height:        General: Pt is alert, awake, not in acute distress Cardiovascular: Irregularly irregular, S1/S2 +, no rubs, no gallops Respiratory: CTA bilaterally, no wheezing, no rhonchi Abdominal: Soft, NT, ND, bowel sounds + Extremities: no edema, no cyanosis    The results of significant diagnostics from this hospitalization (including imaging, microbiology, ancillary and laboratory) are listed below for reference.     Microbiology: Recent Results (from the past 240 hours)  Resp panel by RT-PCR (RSV, Flu A&B, Covid) Anterior Nasal Swab     Status: Abnormal   Collection Time: 10/18/23  8:56 PM   Specimen: Anterior Nasal Swab  Result Value Ref Range Status   SARS Coronavirus 2 by RT PCR POSITIVE (A) NEGATIVE Final   Influenza A by PCR NEGATIVE NEGATIVE Final   Influenza B by PCR NEGATIVE NEGATIVE Final    Comment: (NOTE) The Xpert Xpress SARS-CoV-2/FLU/RSV plus assay is intended as an aid in the diagnosis of influenza from Nasopharyngeal swab specimens and should not be used as a sole basis for treatment. Nasal washings and aspirates are unacceptable for Xpert Xpress SARS-CoV-2/FLU/RSV testing.  Fact Sheet for Patients: BloggerCourse.com  Fact Sheet for Healthcare Providers: SeriousBroker.it  This test is not yet approved or cleared by the [place] FDA and has been authorized for detection and/or diagnosis of SARS-CoV-2 by FDA under an Emergency Use Authorization (EUA). This EUA will remain in effect (meaning this test can be used) for the duration of the COVID-19 declaration under Section 564(b)(1) of the Act, 21 U.S.C. section  360bbb-3(b)(1), unless the authorization is terminated or revoked.     Resp Syncytial Virus by PCR NEGATIVE NEGATIVE Final    Comment: (NOTE) Fact Sheet for Patients: BloggerCourse.com  Fact Sheet for Healthcare Providers: SeriousBroker.it  This test is not yet approved or cleared by the [place] FDA and has been authorized for detection and/or diagnosis of SARS-CoV-2 by FDA under an Emergency Use Authorization (EUA). This EUA will remain in effect (meaning this test can be used) for the duration of the COVID-19 declaration under Section 564(b)(1) of the Act, 21 U.S.C. section 360bbb-3(b)(1), unless the authorization is terminated or revoked.  Performed at [facility], [address] [place], [place] [address]      Labs: BNP (last 3 results) Recent Labs    01/07/23 2145 10/18/23 1135  BNP 790.3* 738.0*   Basic Metabolic Panel: Recent Labs  Lab 10/18/23 2349 10/19/23 0432 10/19/23 2056 10/20/23 0526 10/21/23 0534 10/22/23 0530 10/23/23 0518 10/24/23 0200  NA 135 140  --  139 141 138 137 137  K 2.9* 3.9  --  3.9 4.0 4.1 3.9 4.3  CL 98 99  --  101 97* 100 100 99  CO2 25 29  --  32  29 31 30 27   GLUCOSE 324* 66*   < > 119* 119* 134* 172* 203*  BUN 31* 29*  --  25* 23 31* 34* 42*  CREATININE 0.83 0.74  --  0.69 0.62 0.58* 0.61 0.67  CALCIUM 7.9* 9.2  --  8.7* 9.8 8.8* 8.9 8.8*  MG 1.4* 2.9*  --   --  1.7 1.8 1.8 2.0  PHOS 2.9 3.2  --   --   --   --   --   --    < > = values in this interval not displayed.   Liver Function Tests: Recent Labs  Lab 10/18/23 1141 10/19/23 0432  AST 38 34  ALT 51* 45*  ALKPHOS 87 83  BILITOT 0.8 0.7  PROT 6.6 6.5  ALBUMIN 2.3* 2.2*   No results for input(s): "LIPASE", "AMYLASE" in the last 168 hours. No results for input(s): "AMMONIA" in the last 168 hours. CBC: Recent Labs  Lab 10/18/23 1141 10/19/23 0432 10/20/23 0526 10/21/23 0534  WBC 7.2 10.3 7.8 8.4   NEUTROABS 6.8  --   --   --   HGB 12.0* 12.1* 11.7* 12.2*  HCT 35.1* 34.4* 34.1* 35.7*  MCV 100.0 98.0 99.7 100.3*  PLT 268 262 236 252   Cardiac Enzymes: Recent Labs  Lab 10/18/23 2349  CKTOTAL 82   BNP: Invalid input(s): "POCBNP" CBG: Recent Labs  Lab 10/23/23 2020 10/23/23 2350 10/24/23 0435 10/24/23 0829 10/24/23 1139  GLUCAP 193* 193* 200* 171* 268*   D-Dimer No results for input(s): "DDIMER" in the last 72 hours. Hgb A1c No results for input(s): "HGBA1C" in the last 72 hours. Lipid Profile No results for input(s): "CHOL", "HDL", "LDLCALC", "TRIG", "CHOLHDL", "LDLDIRECT" in the last 72 hours. Thyroid function studies No results for input(s): "TSH", "T4TOTAL", "T3FREE", "THYROIDAB" in the last 72 hours.  Invalid input(s): "FREET3" Anemia work up No results for input(s): "VITAMINB12", "FOLATE", "FERRITIN", "TIBC", "IRON", "RETICCTPCT" in the last 72 hours. Urinalysis    Component Value Date/Time   COLORURINE STRAW (A) 10/18/2023 1900   APPEARANCEUR CLEAR 10/18/2023 1900   LABSPEC 1.009 10/18/2023 1900   PHURINE 5.0 10/18/2023 1900   GLUCOSEU >=500 (A) 10/18/2023 1900   HGBUR SMALL (A) 10/18/2023 1900   HGBUR negative 07/20/2008 1053   BILIRUBINUR NEGATIVE 10/18/2023 1900   KETONESUR NEGATIVE 10/18/2023 1900   PROTEINUR NEGATIVE 10/18/2023 1900   UROBILINOGEN negative 07/20/2008 1053   NITRITE NEGATIVE 10/18/2023 1900   LEUKOCYTESUR NEGATIVE 10/18/2023 1900   Sepsis Labs Recent Labs  Lab 10/18/23 1141 10/19/23 0432 10/20/23 0526 10/21/23 0534  WBC 7.2 10.3 7.8 8.4   Microbiology Recent Results (from the past 240 hours)  Resp panel by RT-PCR (RSV, Flu A&B, Covid) Anterior Nasal Swab     Status: Abnormal   Collection Time: 10/18/23  8:56 PM   Specimen: Anterior Nasal Swab  Result Value Ref Range Status   SARS Coronavirus 2 by RT PCR POSITIVE (A) NEGATIVE Final   Influenza A by PCR NEGATIVE NEGATIVE Final   Influenza B by PCR NEGATIVE NEGATIVE  Final    Comment: (NOTE) The Xpert Xpress SARS-CoV-2/FLU/RSV plus assay is intended as an aid in the diagnosis of influenza from Nasopharyngeal swab specimens and should not be used as a sole basis for treatment. Nasal washings and aspirates are unacceptable for Xpert Xpress SARS-CoV-2/FLU/RSV testing.  Fact Sheet for Patients: BloggerCourse.com  Fact Sheet for Healthcare Providers: SeriousBroker.it  This test is not yet approved or cleared by the [place] FDA  and has been authorized for detection and/or diagnosis of SARS-CoV-2 by FDA under an Emergency Use Authorization (EUA). This EUA will remain in effect (meaning this test can be used) for the duration of the COVID-19 declaration under Section 564(b)(1) of the Act, 21 U.S.C. section 360bbb-3(b)(1), unless the authorization is terminated or revoked.     Resp Syncytial Virus by PCR NEGATIVE NEGATIVE Final    Comment: (NOTE) Fact Sheet for Patients: BloggerCourse.com  Fact Sheet for Healthcare Providers: SeriousBroker.it  This test is not yet approved or cleared by the [place] FDA and has been authorized for detection and/or diagnosis of SARS-CoV-2 by FDA under an Emergency Use Authorization (EUA). This EUA will remain in effect (meaning this test can be used) for the duration of the COVID-19 declaration under Section 564(b)(1) of the Act, 21 U.S.C. section 360bbb-3(b)(1), unless the authorization is terminated or revoked.  Performed at [facility], [address], [place], [place] [address]      Time coordinating discharge:  35 minutes  SIGNED:   Dorcas Carrow, MD  [facility] 10/24/2023, 1:37 PM

## 2023-10-24 NOTE — TOC Transition Note (Signed)
 Transition of Care [facility]) - Discharge Note   Patient Details  Name: Todd [name] MRN: [id number] Date of Birth: 1940-09-05  Transition of Care [facility]) CM/SW Contact:  Tom-Johnson, Hershal Coria, RN Phone Number: 10/24/2023, 3:09 PM   Clinical Narrative:     Patient is scheduled for discharge today.  Readmission Risk Assessment done. Home health info, Outpatient referral, hospital f/u and discharge instructions on AVS. Daughter, Cala Bradford to transport at discharge.  No further TOC needs noted.     Final next level of care: Home w Home Health Services Barriers to Discharge: Barriers Resolved   Patient Goals and CMS Choice Patient states their goals for this hospitalization and ongoing recovery are:: To return home CMS Medicare.gov Compare Post Acute Care list provided to:: Patient Choice offered to / list presented to : Patient      Discharge Placement                Patient to be transferred to facility by: Daughter Name of family member notified: [facility]    Discharge Plan and Services Additional resources added to the After Visit Summary for                  DME Arranged: N/A DME Agency: NA       HH Arranged: PT, OT, Speech Therapy HH Agency: [facility] Date [facility] Agency Contacted: 10/24/23 Time HH Agency Contacted: 1505 Representative spoke with at [facility] Agency: Amy  Social Drivers of Health (SDOH) Interventions SDOH Screenings   Food Insecurity: No Food Insecurity (10/19/2023)  Housing: Low Risk  (10/23/2023)  Transportation Needs: No Transportation Needs (10/23/2023)  Utilities: Not At Risk (10/19/2023)  Alcohol Screen: Low Risk  (10/23/2023)  Depression (PHQ2-9): Low Risk  (08/03/2023)  Financial Resource Strain: Low Risk  (10/23/2023)  Physical Activity: Inactive (08/03/2023)  Social Connections: Moderately Integrated (10/19/2023)  Stress: No Stress Concern Present (08/03/2023)  Tobacco Use: Low Risk  (10/19/2023)  Recent Concern: Tobacco Use - Medium  Risk (10/09/2023)   Received from [facility]  Health Literacy: Adequate Health Literacy (08/03/2023)     Readmission Risk Interventions    10/24/2023    3:07 PM  Readmission Risk Prevention Plan  Transportation Screening Complete  PCP or Specialist Appt within 5-7 Days Complete  Home Care Screening Complete  Medication Review (RN CM) Referral to Pharmacy

## 2023-10-24 NOTE — Inpatient Diabetes Management (Signed)
 Inpatient Diabetes Program Recommendations  AACE/ADA: New Consensus Statement on Inpatient Glycemic Control (2015)  Target Ranges:  Prepandial:   less than 140 mg/dL      Peak postprandial:   less than 180 mg/dL (1-2 hours)      Critically ill patients:  140 - 180 mg/dL     Latest Reference Range & Units 10/22/23 07:30 10/22/23 11:45 10/22/23 12:44 10/22/23 16:38 10/22/23 20:13 10/22/23 23:27 10/23/23 04:50 10/23/23 09:08  Glucose-Capillary 70 - 99 mg/dL 161 (H)  Novolog 2 units 405 (H) 465 (H)  Novolog 22 units 217 (H)  Novolog 5 units 186 (H)     Lantus 16 units 243 (H) 179 (H) 156 (H)  Novolog 3 units    Latest Reference Range & Units 10/23/23 09:08 10/23/23 12:21 10/23/23 17:17 10/23/23 20:20 10/23/23 23:50 10/24/23 04:35 10/24/23 08:29  Glucose-Capillary 70 - 99 mg/dL 096 (H) 045 (H) 409 (H) 193 (H) 193 (H) 200 (H) 171 (H)   Home DM Meds: Lantus 25 units daily     Metformin 500 mg BID   Current Orders: Lantus 16 units at HS  Novolog 0-15 units TID  Prednisone 10 mg daily (home dose)   MD- Please consider adding Novolog Meal Coverage:  -    Novolog 4 units TID with meals HOLD if pt NPO HOLD if pt eats <50% meals   According to glucose trends in the hospital pt trends at goal in the morning, however, after prednisone dose and meal intake trends easily go into the 300-400 range.   Copay for CGM: FSL3 $0  Does not have smart phone needs reader!!!  --Will follow patient during hospitalization--  Christena Deem RN, MSN, BC-ADM Inpatient Diabetes Coordinator Team Pager [phone] (8a-5p)

## 2023-10-24 NOTE — Plan of Care (Signed)
 Problem: Education: Goal: Ability to describe self-care measures that may prevent or decrease complications (Diabetes Survival Skills Education) will improve Outcome: Progressing Goal: Individualized Educational Video(s) Outcome: Progressing   Problem: Coping: Goal: Ability to adjust to condition or change in health will improve Outcome: Progressing   Problem: Fluid Volume: Goal: Ability to maintain a balanced intake and output will improve Outcome: Progressing   Problem: Health Behavior/Discharge Planning: Goal: Ability to identify and utilize available resources and services will improve Outcome: Progressing Goal: Ability to manage health-related needs will improve Outcome: Progressing   Problem: Metabolic: Goal: Ability to maintain appropriate glucose levels will improve Outcome: Progressing   Problem: Nutritional: Goal: Maintenance of adequate nutrition will improve Outcome: Progressing Goal: Progress toward achieving an optimal weight will improve Outcome: Progressing   Problem: Skin Integrity: Goal: Risk for impaired skin integrity will decrease Outcome: Progressing   Problem: Tissue Perfusion: Goal: Adequacy of tissue perfusion will improve Outcome: Progressing   Problem: Education: Goal: Knowledge of risk factors and measures for prevention of condition will improve Outcome: Progressing   Problem: Coping: Goal: Psychosocial and spiritual needs will be supported Outcome: Progressing   Problem: Respiratory: Goal: Will maintain a patent airway Outcome: Progressing Goal: Complications related to the disease process, condition or treatment will be avoided or minimized Outcome: Progressing   Problem: Education: Goal: Knowledge of General Education information will improve Description: Including pain rating scale, medication(s)/side effects and non-pharmacologic comfort measures Outcome: Progressing   Problem: Health Behavior/Discharge Planning: Goal:  Ability to manage health-related needs will improve Outcome: Progressing   Problem: Clinical Measurements: Goal: Ability to maintain clinical measurements within normal limits will improve Outcome: Progressing Goal: Will remain free from infection Outcome: Progressing Goal: Diagnostic test results will improve Outcome: Progressing Goal: Respiratory complications will improve Outcome: Progressing Goal: Cardiovascular complication will be avoided Outcome: Progressing   Problem: Activity: Goal: Risk for activity intolerance will decrease Outcome: Progressing   Problem: Nutrition: Goal: Adequate nutrition will be maintained Outcome: Progressing   Problem: Coping: Goal: Level of anxiety will decrease Outcome: Progressing   Problem: Elimination: Goal: Will not experience complications related to bowel motility Outcome: Progressing Goal: Will not experience complications related to urinary retention Outcome: Progressing   Problem: Pain Managment: Goal: General experience of comfort will improve and/or be controlled Outcome: Progressing   Problem: Safety: Goal: Ability to remain free from injury will improve Outcome: Progressing   Problem: Skin Integrity: Goal: Risk for impaired skin integrity will decrease Outcome: Progressing   Problem: Education: Goal: Knowledge of General Education information will improve Description: Including pain rating scale, medication(s)/side effects and non-pharmacologic comfort measures Outcome: Progressing   Problem: Health Behavior/Discharge Planning: Goal: Ability to manage health-related needs will improve Outcome: Progressing   Problem: Clinical Measurements: Goal: Ability to maintain clinical measurements within normal limits will improve Outcome: Progressing Goal: Will remain free from infection Outcome: Progressing Goal: Diagnostic test results will improve Outcome: Progressing Goal: Respiratory complications will  improve Outcome: Progressing Goal: Cardiovascular complication will be avoided Outcome: Progressing   Problem: Activity: Goal: Risk for activity intolerance will decrease Outcome: Progressing   Problem: Nutrition: Goal: Adequate nutrition will be maintained Outcome: Progressing   Problem: Coping: Goal: Level of anxiety will decrease Outcome: Progressing   Problem: Elimination: Goal: Will not experience complications related to bowel motility Outcome: Progressing Goal: Will not experience complications related to urinary retention Outcome: Progressing   Problem: Pain Managment: Goal: General experience of comfort will improve and/or be controlled Outcome: Progressing   Problem: Safety:  Goal: Ability to remain free from injury will improve Outcome: Progressing   Problem: Skin Integrity: Goal: Risk for impaired skin integrity will decrease Outcome: Progressing

## 2023-10-24 NOTE — Inpatient Diabetes Management (Addendum)
 Inpatient Diabetes Program Recommendations  AACE/ADA: New Consensus Statement on Inpatient Glycemic Control (2015)  Target Ranges:  Prepandial:   less than 140 mg/dL      Peak postprandial:   less than 180 mg/dL (1-2 hours)      Critically ill patients:  140 - 180 mg/dL   Lab Results  Component Value Date   GLUCAP 171 (H) 10/24/2023   HGBA1C 12.7 (H) 10/18/2023    Discharge Recommendations: Other recommendations: Freestyle Libre 3 Senosr order # [id number], Dorathy Daft 3 Reader order # [id number] Long acting recommendations:  may need reduction in long actig insulin dose for home.  Short acting recommendations:  Meal coverage ONLY Insulin aspart (NOVOLOG) FlexPen  5 units tid before meals   Supply/Referral recommendations: Pen needles - standard   Use Adult Diabetes Insulin Treatment Post Discharge order set.  Spoke with pt and family again gave them 2 Freestyle Libre sensors to apply at home when they received the reader to be able to set up and use to use with the sensor. Gave daughter my number in case they need assistance with set up and applying sensor at home.  Christena Deem RN, MSN, BC-ADM Inpatient Diabetes Coordinator Team Pager [phone] (8a-5p)

## 2023-10-24 NOTE — Progress Notes (Addendum)
   10/24/23 9528  Mobility  Activity Transferred from bed to chair  Level of Assistance Contact guard assist, steadying assist  Assistive Device None  Activity Response Tolerated fair  Mobility Referral Yes  Mobility visit 1 Mobility  Mobility Specialist Start Time (ACUTE ONLY) 0910  Mobility Specialist Stop Time (ACUTE ONLY) 0922  Mobility Specialist Time Calculation (min) (ACUTE ONLY) 12 min   Mobility Specialist: Progress Note Visits: 2  Visit Pre-Mobility:      HR 90s, SpO2 95% RA During Mobility: HR 120s,  Post-Mobility:    HR 110s, SpO2 96% RA  Pt agreeable to mobility session - received in bed. C/o pain in tail bone . Returned to chair with all needs met - call bell within reach. Chair alarm on. Left with RN present.   Visit Pt agreeable to mobility session - received in chair. C/o pain in tail bone . Returned to bed with all needs met - call bell within reach. Family present. Chair alarm not on upon arrival to room.   Barnie Mort, BS Mobility Specialist Please contact via SecureChat or  Rehab office at [phone].

## 2023-10-24 NOTE — Telephone Encounter (Signed)
 Pharmacy Patient Advocate Encounter  Insurance verification completed.    The patient is insured through [facility].                                         Ran test claim for Humalog and it is non formulary.   Ran test claim for Novolog and the current 30 day co-pay is $0.00.    This test claim was processed through [facility]- copay amounts may vary at other pharmacies due to pharmacy/plan contracts, or as the patient moves through the different stages of their insurance plan.

## 2023-10-25 ENCOUNTER — Telehealth: Payer: Self-pay

## 2023-10-25 LAB — GLUCOSE, CAPILLARY
Glucose-Capillary: 119 mg/dL — ABNORMAL HIGH (ref 70–99)
Glucose-Capillary: 268 mg/dL — ABNORMAL HIGH (ref 70–99)

## 2023-10-25 NOTE — Progress Notes (Signed)
Home oxygen delivered to room at this time.

## 2023-10-25 NOTE — Progress Notes (Addendum)
 SATURATION QUALIFICATIONS: (This note is used to comply with regulatory documentation for home oxygen)  Patient Saturations on Room Air at Rest = 89%  Patient Saturations on Room Air while Ambulating = 78%  Patient Saturations on 2 Liters of oxygen while at rest= 90%  Patient will need home oxygen in order to maintain a sufficient and therapeutic oxygen saturation  Agree with assessment

## 2023-10-25 NOTE — Progress Notes (Signed)
 SATURATION QUALIFICATIONS: (This note is used to comply with regulatory documentation for home oxygen)  Patient Saturations on Room Air at Rest = 89%  Patient Saturations on Room Air while Ambulating = 78%  Please briefly explain why patient needs home oxygen:patient will need home oxygen in order to maintain an oxygen level that is sufficient.

## 2023-10-25 NOTE — TOC Transition Note (Signed)
 Transition of Care [facility]) - Discharge Note   Patient Details  Name: Todd [name] MRN: [id number] Date of Birth: 12/14/1940  Transition of Care [facility]) CM/SW Contact:  Lockie Pares, RN Phone Number: 10/25/2023, 1:29 PM   Clinical Narrative:    Patient discharging today. Need 3:1 and oxygen Calld rotech, oxygen parameters done. Awaiting oxygen prior to DC   Final next level of care: Home w Home Health Services Barriers to Discharge: Barriers Resolved   Patient Goals and CMS Choice Patient states their goals for this hospitalization and ongoing recovery are:: To return home CMS Medicare.gov Compare Post Acute Care list provided to:: Patient Choice offered to / list presented to : Patient      Discharge Placement                Patient to be transferred to facility by: Daughter Name of family member notified: [facility] Additional resources added to the After Visit Summary for                  DME Arranged: Bedside commode DME Agency: [employer] Date DME Agency Contacted: 10/25/23 Time DME Agency Contacted: 1050 Representative spoke with at DME Agency: [place] HH Arranged: PT, OT, Speech Therapy HH Agency: [facility] Date [facility] Agency Contacted: 10/24/23 Time HH Agency Contacted: 1505 Representative spoke with at [facility] Agency: Amy  Social Drivers of Health (SDOH) Interventions SDOH Screenings   Food Insecurity: No Food Insecurity (10/19/2023)  Housing: Low Risk  (10/23/2023)  Transportation Needs: No Transportation Needs (10/23/2023)  Utilities: Not At Risk (10/19/2023)  Alcohol Screen: Low Risk  (10/23/2023)  Depression (PHQ2-9): Low Risk  (08/03/2023)  Financial Resource Strain: Low Risk  (10/23/2023)  Physical Activity: Inactive (08/03/2023)  Social Connections: Moderately Integrated (10/19/2023)  Stress: No Stress Concern Present (08/03/2023)  Tobacco Use: Low Risk  (10/19/2023)  Recent Concern: Tobacco Use - Medium  Risk (10/09/2023)   Received from [facility]  Health Literacy: Adequate Health Literacy (08/03/2023)     Readmission Risk Interventions    10/24/2023    3:07 PM  Readmission Risk Prevention Plan  Transportation Screening Complete  PCP or Specialist Appt within 5-7 Days Complete  Home Care Screening Complete  Medication Review (RN CM) Referral to Pharmacy

## 2023-10-25 NOTE — Progress Notes (Signed)
 Patient seen and examined.  No overnight events.  He could not go home yesterday because of questionable difficulty urinating.  Overnight he urinated well, no retention on bladder scans.  UA was examined and without any evidence of infection.  Patient is eager to go home. Called and discussed with patient's daughter.  Discharging home with home health PT OT.  Patient will also be provided with bedside commode.  Family agreed to outpatient palliative care referral.   Stable for discharge. No charge visit.

## 2023-10-25 NOTE — Telephone Encounter (Signed)
 Copied from CRM [id number]. Topic: Clinical - Prescription Issue >> Oct 25, 2023 10:27 AM Drema Balzarine wrote: Reason for CRM: Patient admitted to [facility] And they changed insulin medication from 25mg  -20mg  and patient needs it sent [facility] - [place], [place] - [address] [place] [address] Phone: [phone] Fax: [phone]  Also, Patient daughter Cala Bradford request a call back from regarding how patient is doing

## 2023-10-25 NOTE — Progress Notes (Signed)
 Occupational Therapy Treatment Patient Details Name: Todd [name] MRN: [id number] DOB: 05-24-1941 Today's Date: 10/25/2023   History of present illness Pt is 83 yo male presented on 10/18/23 with progressive weakness and shortness of breath and worsening LE edema.  Pt found to bed COVID +.  Pt with hx including but not limited to A.fib on Xarelto, DM2, pulmonary fibrosis, HTN, HLD, DM2, CAD sp CABG   OT comments  Patient received in supine with O2 detached and SpO2 at 96% with BP 116/74(88). Patient was supervision to get to EOB and BP was 129/97(107) with SpO2 93% on RA. Patient stood from EOB with SpO2 90% and BP 140/93(109). Patient ambulated to sink with CGA HHA and performed grooming tasks. Patient ambulated to recliner with SpO2 dropping to low to mid 80's and O2 with 2 liters reapplied. Discharge recommendations continue to be appropriate. Acute OT to continue to follow.       If plan is discharge home, recommend the following:  A little help with walking and/or transfers;Assistance with cooking/housework;A lot of help with bathing/dressing/bathroom   Equipment Recommendations  BSC/3in1    Recommendations for Other Services      Precautions / Restrictions Precautions Precautions: Fall Recall of Precautions/Restrictions: Intact Precaution/Restrictions Comments: COVID+ Restrictions Weight Bearing Restrictions Per Provider Order: No       Mobility Bed Mobility Overal bed mobility: Needs Assistance Bed Mobility: Sit to Supine     Supine to sit: Supervision, HOB elevated, Used rails     General bed mobility comments: supervision and increased time    Transfers Overall transfer level: Needs assistance Equipment used: 1 person hand held assist Transfers: Sit to/from Stand Sit to Stand: Contact guard assist           General transfer comment: CGA to stand and for transfers     Balance Overall balance assessment: Needs assistance Sitting-balance support: No upper  extremity supported Sitting balance-Leahy Scale: Good Sitting balance - Comments: EOB   Standing balance support: During functional activity, Single extremity supported Standing balance-Leahy Scale: Fair Standing balance comment: HHA for safety with dynamic balance                           ADL either performed or assessed with clinical judgement   ADL Overall ADL's : Needs assistance/impaired Eating/Feeding: Independent;Sitting   Grooming: Wash/dry hands;Wash/dry face;Oral care;Supervision/safety;Standing Grooming Details (indicate cue type and reason): at sink         Upper Body Dressing : Contact guard assist;Standing Upper Body Dressing Details (indicate cue type and reason): additional gown     Toilet Transfer: Contact guard Marine scientist Details (indicate cue type and reason): one person HHA         Functional mobility during ADLs: Contact guard assist General ADL Comments: HHA for mobility and transfers    Extremity/Trunk Assessment Upper Extremity Assessment Upper Extremity Assessment: Generalized weakness            Vision       Perception     Praxis     Communication Communication Communication: Impaired Factors Affecting Communication: Hearing impaired   Cognition Arousal: Alert Behavior During Therapy: WFL for tasks assessed/performed Cognition: No apparent impairments                               Following commands: Intact        Cueing   Cueing  Techniques: Verbal cues  Exercises      Shoulder Instructions       General Comments BP 116/74(88) and SpO2 96% on RA in supine, seated on EOB on RA 93% SpO2, BP 129/97 (107), standing BP 140/93(109) SpO2 90% on RA, SpO2 in low to mid 80's with mobility on RA, 2 liters SpO2 added at end of session    Pertinent Vitals/ Pain       Pain Assessment Pain Assessment: No/denies pain Pain Intervention(s): Monitored during session  Home Living                                           Prior Functioning/Environment              Frequency  Min 2X/week        Progress Toward Goals  OT Goals(current goals can now be found in the care plan section)  Progress towards OT goals: Progressing toward goals  Acute Rehab OT Goals Patient Stated Goal: go home OT Goal Formulation: With patient Time For Goal Achievement: 11/03/23 Potential to Achieve Goals: Good ADL Goals Pt Will Perform Grooming: standing;with contact guard assist Pt Will Transfer to Toilet: with contact guard assist;ambulating Pt/caregiver will Perform Home Exercise Program: Increased strength;Both right and left upper extremity;With theraband;Independently;With written HEP provided Additional ADL Goal #2: Pt will demonstrate independent use of energy conservation strategies prn in functional activities.  Plan      Co-evaluation                 AM-PAC OT "6 Clicks" Daily Activity     Outcome Measure   Help from another person eating meals?: None Help from another person taking care of personal grooming?: A Little Help from another person toileting, which includes using toliet, bedpan, or urinal?: A Little Help from another person bathing (including washing, rinsing, drying)?: A Little Help from another person to put on and taking off regular upper body clothing?: A Little Help from another person to put on and taking off regular lower body clothing?: A Lot 6 Click Score: 18    End of Session Equipment Utilized During Treatment: Gait belt  OT Visit Diagnosis: Unsteadiness on feet (R26.81);Other abnormalities of gait and mobility (R26.89);Other (comment) (cardiopulmonary status liimiting activity)   Activity Tolerance Patient tolerated treatment well   Patient Left in chair;with call bell/phone within reach;with chair alarm set   Nurse Communication Mobility status (SpO2 on RA)        Time: 1324-4010 OT Time Calculation (min):  24 min  Charges: OT General Charges $OT Visit: 1 Visit OT Treatments $Self Care/Home Management : 8-22 mins $Therapeutic Activity: 8-22 mins  Todd [name], OTA Acute Rehabilitation Services  Office [phone]   Todd [name] 10/25/2023, 9:11 AM

## 2023-10-25 NOTE — Plan of Care (Signed)

## 2023-10-26 ENCOUNTER — Telehealth: Payer: Self-pay | Admitting: *Deleted

## 2023-10-26 NOTE — Transitions of Care (Post Inpatient/ED Visit) (Signed)
   10/26/2023  Name: Todd [name] MRN: [id number] DOB: 1941/07/20  Today's TOC FU Call Status: Today's TOC FU Call Status:: Unsuccessful Call (1st Attempt) Unsuccessful Call (1st Attempt) Date: 10/26/23  Attempted to reach the patient regarding the most recent Inpatient visit; left HIPAA compliant voice message requesting call back  Follow Up Plan: Additional outreach attempts will be made to reach the patient to complete the Transitions of Care (Post Inpatient visit) call.   Pls call/ message for questions,  Caryl Pina, RN, BSN, CCRN Alumnus RN Care Manager  Transitions of Care  [facility] - [facility] [phone]: direct office

## 2023-10-29 ENCOUNTER — Encounter: Payer: Self-pay | Admitting: Internal Medicine

## 2023-10-29 ENCOUNTER — Telehealth: Payer: Self-pay | Admitting: *Deleted

## 2023-10-29 MED ORDER — LANTUS SOLOSTAR 100 UNIT/ML ~~LOC~~ SOPN: 20.0000 [IU] | PEN_INJECTOR | Freq: Every day | SUBCUTANEOUS | 0 refills | Status: DC

## 2023-10-29 NOTE — Transitions of Care (Post Inpatient/ED Visit) (Signed)
 10/29/2023  Name: Todd [name] MRN: [id number] DOB: [birth year]  Today's TOC FU Call Status: Today's TOC FU Call Status:: Successful TOC FU Call Completed TOC FU Call Complete Date: 10/29/23 Patient's Name and Date of Birth confirmed.  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 Lawerance Bach knows about this, but I don't think it's getting any better.  He is usihng the oxygen on and off.  Selena Batten our daughter is a big help to [place]") Any questions or concerns?: No  Items Reviewed: Did you receive and understand the discharge instructions provided?: Yes (thoroughly reviewed with patient who verbalizes good understanding of same) Medications obtained,verified, and reconciled?: Yes (Medications Reviewed) (Full medication reconciliation/ review completed; no concerns or discrepancies identified; confirmed patient obtained/ is taking all newly Rx'd medications as instructed; spouse-manages medications and denies questions/ concerns around medications today) Any new allergies since your discharge?: No Dietary orders reviewed?: Yes Type of Diet Ordered:: Heart Healthy Low salt and diabetic diet Do you have support at home?: Yes People in Home: spouse Name of Support/Comfort Primary Source: Reports independent in self-care activities; resides with supportive spouse- assists as/ if needed/ indicated; local daughter Selena Batten also assists as needed  Medications Reviewed Today: Medications Reviewed Today     Reviewed by Michaela Corner, RN (Registered Nurse) on 10/29/23 at 1629  Med List Status: <None>    Medication Order Taking? Sig Documenting Provider Last Dose Status Informant  acetaminophen (TYLENOL) 650 MG CR tablet [id number] Yes Take 650 mg by mouth every 8 (eight) hours as needed for pain. [provider] Taking Active Self, Spouse/Significant Other, Child, Pharmacy Records  atorvastatin (LIPITOR) 80 MG tablet [id number] Yes Take 1 tablet (80 mg total) by mouth daily.  Patient taking differently: Take 80 mg by mouth at bedtime.   Hermelinda Dellen, MD Taking Active Self, Spouse/Significant Other, Child, Pharmacy Records           Med Note Melynda Keller Oct 18, 2023  5:30 PM) LF: 08/23/23 for a 90ds  cholecalciferol (VITAMIN D3) 25 MCG (1000 UNIT) tablet [id number] Yes Take 1,000 Units by mouth daily. [provider] Taking Active Self, Spouse/Significant Other, Child, Pharmacy Records  Continuous Glucose Receiver (FREESTYLE LIBRE 3 READER) [place]VI [id number] Yes 1 each by Does not apply route 4 (four) times daily -  before meals and at bedtime. Dorcas Carrow, MD Taking Active   Continuous Glucose Sensor (FREESTYLE LIBRE 3 SENSOR) [place] [id number] Yes 1 each by Does not apply route 4 (four) times daily -  before meals and at bedtime. Place 1 sensor on the skin every 14 days. Use to check glucose continuously Dorcas Carrow, MD Taking Active   Cyanocobalamin (VITAMIN B-12 PO) [id number] Yes Take 1 tablet by mouth daily. [provider] Taking Active Self, Spouse/Significant Other, Child, Pharmacy Records  digoxin (LANOXIN) 0.125 MG tablet [id number] Yes Take 1 tablet (0.125 mg total) by mouth daily. Dorcas Carrow, MD Taking Active   famotidine (PEPCID) 40 MG tablet [id number] Yes Take 2 tablets (80 mg total) by mouth at bedtime. Hermelinda Dellen, MD Taking Active Self, Spouse/Significant Other,  Child, Pharmacy Records  furosemide (LASIX) 40 MG tablet [id number] Yes Take 1 tablet (40 mg total) by mouth daily. [name], MD Taking Active Self, Spouse/Significant  Other, Child, Pharmacy Records           Med Note Melynda Keller Oct 18, 2023  5:29 PM) LF: 07/05/23 for a 90ds  guaiFENesin (MUCINEX) 600 MG 12 hr tablet [id number] Yes Take 1 tablet (600 mg total) by mouth 2 (two) times daily for 7 days. Dorcas Carrow, MD Taking Active   insulin aspart (NOVOLOG) 100 UNIT/ML FlexPen [id number] Yes Inject 4 Units into the skin 3 (three) times daily with meals. Only take if eating a meal AND Blood Glucose (BG) is 80 or higher. Dorcas Carrow, MD Taking Active   insulin glargine (LANTUS SOLOSTAR) 100 UNIT/ML Solostar Pen [id number] Yes Inject 20 Units into the skin daily. [name], MD Taking Active   Insulin Pen Needle (PEN NEEDLES) 31G X 5 MM MISC [id number] Yes 1 each by Does not apply route 3 (three) times daily. May dispense any manufacturer covered by patient's insurance. Dorcas Carrow, MD Taking Active   Insulin Pen Needle (PEN NEEDLES) 32G X 5 MM MISC [id number] Yes UAD for daily lantus injection [name], MD Taking Active Self, Spouse/Significant Other, Child, Pharmacy Records  ipratropium (ATROVENT) 0.06 % nasal spray [id number] Yes Place 2-4 sprays into both nostrils every 8 (eight) hours as needed for rhinitis. [provider] Taking Active Self, Spouse/Significant Other, Child, Pharmacy Records           Med Note Jory Ee, Garnet Koyanagi   Thu Oct 18, 2023  5:50 PM) Patient takes this medication as needed an has not taken it recently.  metFORMIN (GLUCOPHAGE-XR) 500 MG 24 hr tablet [id number] Yes Take 1 tablet (500 mg total) by mouth 2 (two) times daily with a meal. Burns, [name], MD Taking Active Self, Spouse/Significant Other, Child, Pharmacy Records           Med Note Melynda Keller Oct 18, 2023  5:26 PM) LF: 06/13/23 for a 30ds  metoprolol tartrate (LOPRESSOR) 100 MG tablet [id number] Yes TAKE 1 TABLET BY MOUTH 2 TIMES DAILY. Rollene Rotunda, MD Taking Active Self, Spouse/Significant Other, Child, Pharmacy Records            Med Note Melynda Keller Oct 18, 2023  5:24 PM) LF: 10/02/23 for a 90ds  Multiple Vitamin (MULTIVITAMIN PO) [id number] Yes Take 1 tablet by mouth daily. [provider] Taking Active Self, Spouse/Significant Other, Child, Pharmacy Records  Nintedanib (OFEV) 100 MG CAPS [id number] Yes Take 1 capsule (100 mg total) by mouth 2 (two) times daily. Chilton Greathouse, MD Taking Active   nitroGLYCERIN (NITROSTAT) 0.4 MG SL tablet [id number] Yes Place 1 tablet (0.4 mg total) under the tongue every 5 (five) minutes as needed for chest pain. Rollene Rotunda, MD Taking Active Self, Spouse/Significant Other, Child, Pharmacy Records  Omega-3 Fatty Acids (FISH OIL PO) [id number] Yes Take 1 tablet by mouth 2 (two) times daily. [provider] Taking Active Self, Spouse/Significant Other, Child, Pharmacy Records  omeprazole (PRILOSEC) 40 MG capsule [id number] Yes TAKE 1 CAPSULE BY MOUTH 2 TIMES DAILY. [name], MD Taking Active Self, Spouse/Significant Other, Child, Pharmacy Records           Med Note Melynda Keller Oct 18, 2023  5:26 PM) LF: 05/18/23 for a 90ds  ONETOUCH DELICA LANCETS 33G  MISC [id number] Yes TEST BLOOD SUGAR ONCE DAILY Lawerance Bach [name], MD Taking Active Self, Spouse/Significant Other, Child, Pharmacy Records  [facility] VERIO test strip [id number] Yes USE UP TO 4 TIMES A DAY AS DIRECTED Burns, [name], MD Taking Active Self, Spouse/Significant Other, Child, Pharmacy Records  predniSONE (DELTASONE) 10 MG tablet [id number] Yes TAKE 1 TABLET (10 MG TOTAL) BY MOUTH DAILY WITH BREAKFAST. Chilton Greathouse, MD Taking Active Self, Spouse/Significant Other, Child, Pharmacy Records           Med Note Melynda Keller Oct 18, 2023  5:24 PM) LF: 09/24/23 for a 30ds  predniSONE (DELTASONE) 50 MG tablet [id number] Yes Take tablet (50mg ) by mouth 13 hours, 7 hours, 1 hour prior to scan. Lanier Prude, MD Taking Active Self, Spouse/Significant Other, Child,  Pharmacy Records           Med Note Melynda Keller Oct 18, 2023  5:24 PM) LF: 10/16/23 for a 1ds  rivaroxaban (XARELTO) 20 MG TABS tablet [id number] Yes TAKE 1 TABLET (20 MG TOTAL) BY MOUTH DAILY WITH SUPPER. Rollene Rotunda, MD Taking Active Self, Spouse/Significant Other, Child, Pharmacy Records           Med Note Melynda Keller Oct 18, 2023  5:23 PM) LF: 10/16/23 for a 30ds           Home Care and Equipment/Supplies: Were Home Health Services Ordered?: Yes Name of Home Health Agency:: [facility] Has Agency set up a time to come to your home?: Yes First Home Health Visit Date: 10/30/23 Any new equipment or medical supplies ordered?: Yes (home O2) Name of Medical supply agency?: [facility] Were you able to get the equipment/medical supplies?: Yes Do you have any questions related to the use of the equipment/supplies?: No  Functional Questionnaire: Do you need assistance with bathing/showering or dressing?: Yes (spouse and family supervise/ assist as needed) Do you need assistance with meal preparation?: Yes (spouse and family supervise/ assist as needed) Do you need assistance with eating?: No Do you have difficulty maintaining continence: No Do you need assistance with getting out of bed/getting out of a chair/moving?: Yes (spouse and family supervise/ assist as needed) Do you have difficulty managing or taking your medications?: Yes (spouse manages all aspects of medications; family supervise/ assist as needed)  Follow up appointments reviewed: PCP Follow-up appointment confirmed?: Yes Date of PCP follow-up appointment?: 11/02/23 Follow-up Provider: PCP- Dr. Lawerance Bach Specialist [facility] Follow-up appointment confirmed?: Yes Date of Specialist follow-up appointment?: 11/01/23 Follow-Up Specialty Provider:: CHF clinic provider Do you need transportation to your follow-up appointment?: No Do you understand care options if your condition(s) worsen?: Yes-patient  verbalized understanding  SDOH Interventions Today    Flowsheet Row Most Recent Value  SDOH Interventions   Food Insecurity Interventions Intervention Not Indicated  Housing Interventions Intervention Not Indicated  Transportation Interventions Intervention Not Indicated  [daughter provides transportation]  Utilities Interventions Intervention Not Indicated       Goals Addressed             This Visit's Progress    TOC 30-day Program Care Plan   On track    Current Barriers:  Home Health services spouse reports has heard from home health agency: they are to call "today or tomorrow" to schedule initial visit "later this week" Baseline chronic sacral decubitus ulcer: spouse reports, "it's not getting any better;" states this area causes him discomfort and states "Dr. 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: spouse's phone number is connected to her hearing aid: request all calls be placed to spouse at [phone] Local supportive daughter Selena Batten assists with all care needs as indicated and provides transportation to provider office visits New to home O2: spouse reports on 10/29/23: "we had a problem yesterday with the machine; we are waiting on the company to come out today to address the issue; he is not using the oxygen all the time- only when he gets winded"  Verified home O2 provided by [employer] at time of hospital discharge 2 unplanned hospital admissions x last 12 months  RNCM Clinical Goal(s):  Patient will work with the Care Management team over the next 30 days to address Transition of Care Barriers: Provider appointments Home Health services through collaboration with RN Care manager, provider, and care team.   Interventions: Evaluation of current treatment plan related to  self management and patient's adherence to plan as established by provider  Transitions of Care:  New goal. 10/29/23 Durable Medical Equipment (DME) needs  assessed with patient/caregiver Doctor Visits  - discussed the importance of doctor visits Communication with PCP re: enrollment into [facility] 30-day program Post discharge activity limitations prescribed by provider reviewed Full medication review with updating medication list in EHR per patient report  Reinforced role of home health services with importance of participation/ ongoing engagement Confirmed uses assistive devices on regular basis, at baseline -- walker; provided education/ reinforcement around fall prevention  Provided education around benefit of conservative post-hospital discharge activity; need to pace activity without over-doing  Confirms continuing to use home O2 as per hospital discharge instructions: they are awaiting [employer] to arrive due to "home O2 stopped working yesterday so we called them and they said they would come today to fix it" Discussed current management of the sacral decubitus ulcer that spouse tells me about: currently, they are cleaning every day; trying "to keep him off of it;" and "using bandages;" she reports PCP is aware of this area ("it's nothing new"); I encouraged them to have PCP re-assess at time of upcoming scheduled PCP appointment for hospital follow up on 3/114/25: spouse verbalizes understanding/ agreement with same:  care coordination outreach to PCP as FYI/ possible need for addition of RN/ skilled nursing to home health orders Provided education around basic safe use of home O2  Confirmed patient continues monitoring blood sugars at home - using FSL CGM: spouse reports, "his blood sugars are always high because he is on the prednisone" Successfully enrolled into 30-day TOC program: provided my direct contact information should questions/ concerns/ needs arise post-TOC initial call, prior to next TOC 30-day program RN CM telephone visit   TOC 30-day program initial assessment initiated today  Patient Goals/Self-Care Activities: Participate in  Transition of Care Program/Attend TOC scheduled calls Take all medications as prescribed Attend all scheduled provider appointments Call provider office for new concerns or questions  Continue pacing activity to avoid episodes of shortness of breath Continue using home oxygen as prescribed Continue to follow your established action plan for episodes of shortness of breath Use assistive devices as needed to prevent falls Continue monitoring and recording your blood sugars at home Continue working with the home health team that is involved in your care Please show Dr. Lawerance Bach the sacral sore (on his bottom) so she can re-assess this area since the last office visit- she may want to make changes to the treatment plan based on her evaluation  of the area If you believe your condition is getting worse- contact your care providers (doctors) promptly- reaching out to your doctor early when you have concerns can prevent you from having to go to the hospital  Follow Up Plan:  Telephone follow up appointment with care management team member scheduled for:  Tuesday 11/06/23 at 3:00 pm          Total time spent from review to signing of note/ including any care coordination interventions:  85 minutes; HOH caregiver and patient; creation of complex care plan  Pls call/ message for questions,  Caryl Pina, RN, BSN, CCRN Alumnus RN Care Manager  Transitions of Care  [facility] - [facility] [phone]: direct office

## 2023-10-29 NOTE — Telephone Encounter (Signed)
 Rx sent in

## 2023-10-29 NOTE — Patient Instructions (Signed)
 Visit Information  Thank you for taking time to visit with me today. Please don't hesitate to contact me if I can be of assistance to you before our next scheduled telephone appointment.  Our next appointment is by telephone on Tuesday 11/06/23 at 3:00 pm   Please call the care guide team at [phone] if you need to cancel or reschedule your appointment.   Patient Goals/Self-Care Activities: Participate in Transition of Care Program/Attend TOC scheduled calls Take all medications as prescribed Attend all scheduled provider appointments Call provider office for new concerns or questions  Continue pacing activity to avoid episodes of shortness of breath Continue using home oxygen as prescribed Continue to follow your established action plan for episodes of shortness of breath Use assistive devices as needed to prevent falls Continue monitoring and recording your blood sugars at home Continue working with the home health team that is involved in your care Please show Dr. Lawerance Bach the sacral sore (on his bottom) so she can re-assess this area since the last office visit- she may want to make changes to the treatment plan based on her evaluation of the area If you believe your condition is getting worse- contact your care providers (doctors) promptly- reaching out to your doctor early when you have concerns can prevent you from having to go to the hospital  Following is a copy of your care plan:   Goals Addressed             This Visit's Progress    TOC 30-day Program Care Plan   On track    Current Barriers:  Home Health services spouse reports has heard from home health agency: they are to call "today or tomorrow" to schedule initial visit "later this week" Baseline chronic sacral decubitus ulcer: spouse reports, "it's not getting any better;" states this area causes him discomfort and states "Dr. 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:  spouse's phone number is connected to her hearing aid: request all calls be placed to spouse at [phone] Local supportive daughter Selena Batten assists with all care needs as indicated and provides transportation to provider office visits New to home O2: spouse reports on 10/29/23: "we had a problem yesterday with the machine; we are waiting on the company to come out today to address the issue; he is not using the oxygen all the time- only when he gets winded"  Verified home O2 provided by [employer] at time of hospital discharge 2 unplanned hospital admissions x last 12 months  RNCM Clinical Goal(s):  Patient will work with the Care Management team over the next 30 days to address Transition of Care Barriers: Provider appointments Home Health services through collaboration with RN Care manager, provider, and care team.   Interventions: Evaluation of current treatment plan related to  self management and patient's adherence to plan as established by provider  Transitions of Care:  New goal. 10/29/23 Durable Medical Equipment (DME) needs assessed with patient/caregiver Doctor Visits  - discussed the importance of doctor visits Communication with PCP re: enrollment into [facility] 30-day program Post discharge activity limitations prescribed by provider reviewed Full medication review with updating medication list in EHR per patient report  Reinforced role of home health services with importance of participation/ ongoing engagement Confirmed uses assistive devices on regular basis, at baseline -- walker; provided education/ reinforcement around fall prevention  Provided education around benefit of conservative post-hospital discharge activity; need to pace activity without over-doing  Confirms continuing to use home O2 as per hospital discharge instructions: they are awaiting [employer] to arrive due to "home O2 stopped working yesterday so we called them and they said they would come today to fix it" Discussed  current management of the sacral decubitus ulcer that spouse tells me about: currently, they are cleaning every day; trying "to keep him off of it;" and "using bandages;" she reports PCP is aware of this area ("it's nothing new"); I encouraged them to have PCP re-assess at time of upcoming scheduled PCP appointment for hospital follow up on 3/114/25: spouse verbalizes understanding/ agreement with same:  care coordination outreach to PCP as FYI/ possible need for addition of RN/ skilled nursing to home health orders Provided education around basic safe use of home O2  Confirmed patient continues monitoring blood sugars at home - using FSL CGM: spouse reports, "his blood sugars are always high because he is on the prednisone" Successfully enrolled into 30-day TOC program: provided my direct contact information should questions/ concerns/ needs arise post-TOC initial call, prior to next TOC 30-day program RN CM telephone visit   TOC 30-day program initial assessment initiated today  Patient Goals/Self-Care Activities: Participate in Transition of Care Program/Attend TOC scheduled calls Take all medications as prescribed Attend all scheduled provider appointments Call provider office for new concerns or questions  Continue pacing activity to avoid episodes of shortness of breath Continue using home oxygen as prescribed Continue to follow your established action plan for episodes of shortness of breath Use assistive devices as needed to prevent falls Continue monitoring and recording your blood sugars at home Continue working with the home health team that is involved in your care Please show Dr. Lawerance Bach the sacral sore (on his bottom) so she can re-assess this area since the last office visit- she may want to make changes to the treatment plan based on her evaluation of the area If you believe your condition is getting worse- contact your care providers (doctors) promptly- reaching out to your doctor  early when you have concerns can prevent you from having to go to the hospital  Follow Up Plan:  Telephone follow up appointment with care management team member scheduled for:   Tuesday 11/06/23 at 3:00 pm         Patient verbalizes understanding of instructions and care plan provided today and agrees to view in MyChart. Active MyChart status and patient understanding of how to access instructions and care plan via MyChart confirmed with patient.     Telephone follow up appointment with care management team member scheduled for:  Tuesday 11/06/23 at 3:00 pm  If you are experiencing a Mental Health or Behavioral Health Crisis or need someone to talk to, please  call the Suicide and Crisis Lifeline: 988 call the [place] National Suicide Prevention Lifeline: 352-180-0740 or TTY: 509-648-8381 TTY 5146945301) to talk to a trained counselor call 1-800-273-TALK (toll free, 24 hour hotline) go to [facility] [address], [place] [phone]) call the [facility] Crisis Line: 409-786-2759 call 911   Pls call/ message for questions,  Caryl Pina, RN, BSN, CCRN Alumnus RN Care Manager  Transitions of Care  [facility] - [facility] [phone]: direct office

## 2023-10-30 NOTE — Progress Notes (Signed)
 HEART & VASCULAR TRANSITION OF CARE CONSULT NOTE    Referring Physician: [name], MD  EP cardiologist: Dr. Tamela [name] cardiologist: Dr. Antoine [name]  Chief Complaint:   HPI: Referred to clinic by Dr. Jerral [name] for heart failure consultation.   Todd [name] is a 83 y.o. male with CAD s/p CABG x4, HFmrEF, DM II, HTN, HLD, PAF and pulmonary fibrosis.   Admitted 5/24 with a fib RVR and elevated troponin that peaked at 5977. Echo 5/24 showed EF 55-60%, no RWMA and mild LVH. LHC showed patent LIMA to LAD, SVG to RCA, and SVG to OM2 with 65% stenosis of the proximal SVG. Elevated trops thought to be demand ischemia 2/2 a fib RVR. Plan for medical therapy, with consideration of PCI to SVG to OM2 if he has refractory angina. Transitioned back to NSR with IV diltiazem and was transitioned to PO metoprolol.   Zio placed 6/24: showed 10% a fib/flutter burden.   Was noted to have increased swelling in BLE 11/24, suspected 2/2 high sodium intake. He was started on PO lasix. He was referred to EP.   He was seen by EP 2/25, decided to schedule LAA appendage closure at a later date.   Admitted 2/25 with SOB, covid, a fib RVR and weakness. He had a choking episode in the ED, was eventually evaluated by SLP and cleared for a diet. He was seen by cardiology who started him on rate control therapies with plans of OP DCCV once recovered from covid. He was also noted to have fluid on him. Echo showed EF newly reduced to 45% (previously 55-60%), suspected EF reduction 2/2 a fib. Only on metoprolol (GDMT) at discharge, GDMT was limited with soft BP.   Today he presents for AHF [facility] clinic visit with his wife and daughter. Overall feeling pretty weak. Has first home PT today. Denies palpitations, CP, dizziness, or PND/Orthopnea. Still has BLE edema, has noticed improvement. Gets SOB with activity, has to use oxygen after taking his baths as it "wipes him out". Appetite ok. No fever or chills. Weight at home  133-136 pounds. Taking all medications. Denies ETOH, tobacco or drug use. No CHF history in the family but multiple family members with CAD: mom died at 56 after MI, brother died at 21 from MI while riding a Surveyor, mining, nephew died at 58 after MI and niece died at 44 after MI at church.  Past Medical History:  Diagnosis Date   Allergic rhinitis    Asthma    Atrial fibrillation with rapid ventricular response (HCC)    a. newly diagnosed 11/03/13, spont conv to NSR, placed on xarelto.   CAD (coronary artery disease)    a. 1999; s/p CABG: LIMA to LAD, SVG to OM1, SVG to OM2, SVG to RCA  b. Normal nuc 10/2013 (done because of new onset AF.)   COVID    Diabetes mellitus (HCC)    HLD (hyperlipidemia)    HTN (hypertension)    Nephrolithiasis    Overweight(278.02)    PVD (peripheral vascular disease) (HCC)    Carotid stenosis   Stroke [facility])    Current Outpatient Medications  Medication Sig Dispense Refill   acetaminophen (TYLENOL) 650 MG CR tablet Take 650 mg by mouth every 8 (eight) hours as needed for pain.     atorvastatin (LIPITOR) 80 MG tablet Take 1 tablet (80 mg total) by mouth daily. (Patient taking differently: Take 80 mg by mouth at bedtime.) 90 tablet 3   cholecalciferol (VITAMIN  D3) 25 MCG (1000 UNIT) tablet Take 1,000 Units by mouth daily.     Continuous Glucose Receiver (FREESTYLE LIBRE 3 READER) DEVI 1 each by Does not apply route 4 (four) times daily -  before meals and at bedtime. 1 each 0   Continuous Glucose Sensor (FREESTYLE LIBRE 3 SENSOR) MISC 1 each by Does not apply route 4 (four) times daily -  before meals and at bedtime. Place 1 sensor on the skin every 14 days. Use to check glucose continuously 2 each 0   Cyanocobalamin (VITAMIN B-12 PO) Take 1 tablet by mouth daily.     digoxin (LANOXIN) 0.125 MG tablet Take 1 tablet (0.125 mg total) by mouth daily. 30 tablet 0   furosemide (LASIX) 40 MG tablet Take 1 tablet (40 mg total) by mouth daily.     insulin aspart (NOVOLOG)  100 UNIT/ML FlexPen Inject 4 Units into the skin 3 (three) times daily with meals. Only take if eating a meal AND Blood Glucose (BG) is 80 or higher. 15 mL 0   insulin glargine (LANTUS SOLOSTAR) 100 UNIT/ML Solostar Pen Inject 20 Units into the skin daily. 15 mL 0   Insulin Pen Needle (PEN NEEDLES) 31G X 5 MM MISC 1 each by Does not apply route 3 (three) times daily. May dispense any manufacturer covered by patient's insurance. 100 each 0   Insulin Pen Needle (PEN NEEDLES) 32G X 5 MM MISC UAD for daily lantus injection 90 each 3   ipratropium (ATROVENT) 0.06 % nasal spray Place 2-4 sprays into both nostrils every 8 (eight) hours as needed for rhinitis.     metFORMIN (GLUCOPHAGE-XR) 500 MG 24 hr tablet Take 1 tablet (500 mg total) by mouth 2 (two) times daily with a meal. 60 tablet 5   metoprolol tartrate (LOPRESSOR) 100 MG tablet TAKE 1 TABLET BY MOUTH 2 TIMES DAILY. 180 tablet 3   Multiple Vitamin (MULTIVITAMIN PO) Take 1 tablet by mouth daily.     Nintedanib (OFEV) 100 MG CAPS Take 1 capsule (100 mg total) by mouth 2 (two) times daily. 180 capsule 1   nitroGLYCERIN (NITROSTAT) 0.4 MG SL tablet Place 1 tablet (0.4 mg total) under the tongue every 5 (five) minutes as needed for chest pain. 100 tablet 3   Omega-3 Fatty Acids (FISH OIL PO) Take 1 tablet by mouth 2 (two) times daily.     omeprazole (PRILOSEC) 40 MG capsule TAKE 1 CAPSULE BY MOUTH 2 TIMES DAILY. (Patient taking differently: Take 40 mg by mouth daily.) 120 capsule 5   ONETOUCH DELICA LANCETS 33G MISC TEST BLOOD SUGAR ONCE DAILY 100 each 3   ONETOUCH VERIO test strip USE UP TO 4 TIMES A DAY AS DIRECTED 100 strip 0   predniSONE (DELTASONE) 10 MG tablet TAKE 1 TABLET (10 MG TOTAL) BY MOUTH DAILY WITH BREAKFAST. 30 tablet 5   rivaroxaban (XARELTO) 20 MG TABS tablet TAKE 1 TABLET (20 MG TOTAL) BY MOUTH DAILY WITH SUPPER. 30 tablet 5   No current facility-administered medications for this encounter.   Allergies  Allergen Reactions    Amlodipine Besylate     Rash Because of a history of documented adverse serious drug reaction;Medi Alert bracelet  is recommended   Iodinated Contrast Media Rash    Rash  Because of a history of documented adverse serious drug reaction;Medi Alert bracelet  is recommended  Rash, Because of a history of documented adverse serious drug reaction;Medi Alert bracelet  is recommended   Metformin And Related Diarrhea  Social History   Socioeconomic History   Marital status: Married    Spouse name: Darel Hong   Number of children: 2   Years of education: Not on file   Highest education level: High school graduate  Occupational History   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  Tobacco Use   Smoking status: Never   Smokeless tobacco: Never  Vaping Use   Vaping status: Never Used  Substance and Sexual Activity   Alcohol use: No    Alcohol/week: 0.0 standard drinks of alcohol   Drug use: No   Sexual activity: Yes  Other Topics Concern   Not on file  Social History Narrative   Right handed    Social Drivers of Health   Financial Resource Strain: Low Risk  (10/23/2023)   Overall Financial Resource Strain (CARDIA)    Difficulty of Paying Living Expenses: Not very hard  Food Insecurity: No Food Insecurity (10/29/2023)   Hunger Vital Sign    Worried About Running Out of Food in the Last Year: Never true    Ran Out of Food in the Last Year: Never true  Transportation Needs: No Transportation Needs (10/29/2023)   PRAPARE - Administrator, Civil Service (Medical): No    Lack of Transportation (Non-Medical): No  Physical Activity: Inactive (08/03/2023)   Exercise Vital Sign    Days of Exercise per Week: 0 days    Minutes of Exercise per Session: 0 min  Stress: No Stress Concern Present (08/03/2023)   [employer] of Occupational Health - Occupational Stress Questionnaire    Feeling of Stress : Not at all  Social Connections: Moderately Integrated  (10/19/2023)   Social Connection and Isolation Panel [NHANES]    Frequency of Communication with Friends and Family: More than three times a week    Frequency of Social Gatherings with Friends and Family: Twice a week    Attends Religious Services: More than 4 times per year    Active Member of [employer] or Organizations: No    Attends Banker Meetings: Never    Marital Status: Married  Catering manager Violence: Patient Unable To Answer (10/29/2023)   Humiliation, Afraid, Rape, and Kick questionnaire    Fear of Current or Ex-Partner: Patient unable to answer    Emotionally Abused: Patient unable to answer    Physically Abused: Patient unable to answer    Sexually Abused: Patient unable to answer   Family History  Problem Relation Age of Onset   Heart attack Mother 44   Diabetes Father        IDDM   Stroke Father 71   Asthma Sister    Lupus Sister    Fibromyalgia Sister    Fibromyalgia Sister    Prostate cancer Brother    Hemochromatosis Brother    Colon cancer Brother 72   Heart attack Brother 26   Heart attack Brother 66   Esophageal cancer Neg Hx    Rectal cancer Neg Hx    Stomach cancer Neg Hx    Vitals:   11/01/23 1014  BP: 120/80  Pulse: 71  SpO2: 96%  Weight: 62.6 kg (138 lb)   PHYSICAL EXAM: General:  elderly/frail appearing.  No respiratory difficulty Neck: supple. JVD ~8 cm.  Cor: PMI nondisplaced. Regular rate & irregular rhythm. No rubs, gallops or murmurs. Lungs: clear, diminished bases Extremities: no cyanosis, clubbing, rash, +1-2 BLE edema  Neuro: alert & oriented x 3. Moves all 4 extremities  w/o difficulty. Affect pleasant.  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  - Volume  NYHA IIIa, limited by physical deconditioning, volume mildly elevated but improving GDMT  Diuretic- Continue lasix 40 mg daily BB- Continue metoprolol 100 mg BID and Digoxin 0.125 mg daily Ace/ARB/ARNI- Consider adding losartan at next visit MRA- Start spiro 12.5 mg daily. BMET, BNP today. Repeat labs at f/u.  SGLT2i - Will hold off today with recent hypoglycemic episodes at home. He is going to his PCP tomorrow to try to adjust insulin regimen. Can consider at f/u if sugars have stabilized (previously on farxiga, gets for free) - Previously tried to get compression socks from medical supply store but cost was >100$ and insurance would not cover. Worked with his daughter to find some compression socks on [place] and she ordered for him. Encouraged him to wear.   CAD s/p CABG  - s/p CABG x4 99': LIMA to LAD, SVG to OM1, SVG to OM2, SVG to RCA  - LHC 5/24 showed patent LIMA to LAD, SVG to RCA, and SVG to OM2 with 65% stenosis of the proximal SVG. - Continue statin and xarelto - Continue BB  Persistent A fib  - Rhythm control difficult with pulmonary fibrosis - Continue xarelto  - In rate controlled a fib today.  - Continue metoprolol 100 mg BID and digoxin 0.125 mg daily - Followed by EP, plan for LAA closure at a later date. Chest CT pending.   HTN - BP stable  Pulmonary fibrosis - Followed at [facility] - Followed by Dr. Isaiah Serge.    Referred to HFSW (PCP, Medications, Transportation, ETOH Abuse, Drug Abuse, Insurance, Financial ):  No Refer to Pharmacy:  No Refer to Home Health: No Refer to Advanced Heart Failure Clinic: No  Refer to General Cardiology: Continue to follow with Dr. Antoine [name]  Follow up in 2 weeks to titrate GDMT.

## 2023-11-01 ENCOUNTER — Encounter: Payer: Self-pay | Admitting: Internal Medicine

## 2023-11-01 ENCOUNTER — Encounter (HOSPITAL_COMMUNITY): Payer: Self-pay

## 2023-11-01 ENCOUNTER — Ambulatory Visit (HOSPITAL_COMMUNITY)
Admit: 2023-11-01 | Discharge: 2023-11-01 | Disposition: A | Discharge status: Home / Self Care | Attending: Internal Medicine | Admitting: Internal Medicine

## 2023-11-01 VITALS — BP 120/80 | HR 71 | Wt 138.0 lb

## 2023-11-01 DIAGNOSIS — E1165 Type 2 diabetes mellitus with hyperglycemia: Secondary | ICD-10-CM | POA: Diagnosis not present

## 2023-11-01 DIAGNOSIS — E1151 Type 2 diabetes mellitus with diabetic peripheral angiopathy without gangrene: Secondary | ICD-10-CM | POA: Diagnosis not present

## 2023-11-01 DIAGNOSIS — I5020 Unspecified systolic (congestive) heart failure: Secondary | ICD-10-CM | POA: Diagnosis not present

## 2023-11-01 DIAGNOSIS — Z7901 Long term (current) use of anticoagulants: Secondary | ICD-10-CM | POA: Insufficient documentation

## 2023-11-01 DIAGNOSIS — I10 Essential (primary) hypertension: Secondary | ICD-10-CM

## 2023-11-01 DIAGNOSIS — I2510 Atherosclerotic heart disease of native coronary artery without angina pectoris: Secondary | ICD-10-CM | POA: Diagnosis not present

## 2023-11-01 DIAGNOSIS — Z8249 Family history of ischemic heart disease and other diseases of the circulatory system: Secondary | ICD-10-CM | POA: Diagnosis not present

## 2023-11-01 DIAGNOSIS — R531 Weakness: Secondary | ICD-10-CM | POA: Insufficient documentation

## 2023-11-01 DIAGNOSIS — I4819 Other persistent atrial fibrillation: Secondary | ICD-10-CM | POA: Diagnosis not present

## 2023-11-01 DIAGNOSIS — J9601 Acute respiratory failure with hypoxia: Secondary | ICD-10-CM | POA: Diagnosis not present

## 2023-11-01 DIAGNOSIS — Z951 Presence of aortocoronary bypass graft: Secondary | ICD-10-CM | POA: Insufficient documentation

## 2023-11-01 DIAGNOSIS — J8410 Pulmonary fibrosis, unspecified: Secondary | ICD-10-CM | POA: Diagnosis not present

## 2023-11-01 DIAGNOSIS — Z8616 Personal history of COVID-19: Secondary | ICD-10-CM | POA: Insufficient documentation

## 2023-11-01 DIAGNOSIS — I4891 Unspecified atrial fibrillation: Secondary | ICD-10-CM

## 2023-11-01 DIAGNOSIS — E785 Hyperlipidemia, unspecified: Secondary | ICD-10-CM | POA: Insufficient documentation

## 2023-11-01 DIAGNOSIS — U071 COVID-19: Secondary | ICD-10-CM | POA: Diagnosis not present

## 2023-11-01 DIAGNOSIS — I5021 Acute systolic (congestive) heart failure: Secondary | ICD-10-CM | POA: Diagnosis not present

## 2023-11-01 DIAGNOSIS — Z794 Long term (current) use of insulin: Secondary | ICD-10-CM | POA: Insufficient documentation

## 2023-11-01 DIAGNOSIS — I5022 Chronic systolic (congestive) heart failure: Secondary | ICD-10-CM | POA: Diagnosis not present

## 2023-11-01 DIAGNOSIS — Z79899 Other long term (current) drug therapy: Secondary | ICD-10-CM | POA: Insufficient documentation

## 2023-11-01 DIAGNOSIS — I110 Hypertensive heart disease with heart failure: Secondary | ICD-10-CM | POA: Insufficient documentation

## 2023-11-01 DIAGNOSIS — Z9981 Dependence on supplemental oxygen: Secondary | ICD-10-CM | POA: Diagnosis not present

## 2023-11-01 DIAGNOSIS — Z7984 Long term (current) use of oral hypoglycemic drugs: Secondary | ICD-10-CM | POA: Diagnosis not present

## 2023-11-01 DIAGNOSIS — J849 Interstitial pulmonary disease, unspecified: Secondary | ICD-10-CM | POA: Diagnosis not present

## 2023-11-01 DIAGNOSIS — I480 Paroxysmal atrial fibrillation: Secondary | ICD-10-CM | POA: Diagnosis not present

## 2023-11-01 DIAGNOSIS — R1312 Dysphagia, oropharyngeal phase: Secondary | ICD-10-CM | POA: Diagnosis not present

## 2023-11-01 LAB — BRAIN NATRIURETIC PEPTIDE: B Natriuretic Peptide: 482 pg/mL — ABNORMAL HIGH (ref 0.0–100.0)

## 2023-11-01 LAB — BASIC METABOLIC PANEL
Anion gap: 13 (ref 5–15)
BUN: 32 mg/dL — ABNORMAL HIGH (ref 8–23)
CO2: 25 mmol/L (ref 22–32)
Calcium: 9.5 mg/dL (ref 8.9–10.3)
Chloride: 102 mmol/L (ref 98–111)
Creatinine, Ser: 0.97 mg/dL (ref 0.61–1.24)
GFR, Estimated: >60 mL/min (ref >60)
Glucose, Bld: 191 mg/dL — ABNORMAL HIGH (ref 70–99)
Potassium: 4.8 mmol/L (ref 3.5–5.1)
Sodium: 140 mmol/L (ref 135–145)

## 2023-11-01 MED ORDER — SPIRONOLACTONE 25 MG PO TABS: 12.5000 mg | ORAL_TABLET | Freq: Every day | ORAL | 3 refills | Status: AC

## 2023-11-01 NOTE — Patient Instructions (Signed)
 Start Spiro 12.5 mg (1/2 pill) daily. Labs today - will call you if abnormal. Please wear your compression hose daily. Food list provided to patient. Pill splitter provided to patient. Return to Heart Failure TOC Clinic in 2 weeks. Please call us at [phone] if any questions or concerns prior to your next visit.

## 2023-11-01 NOTE — Progress Notes (Signed)
 ReDS Vest / Clip - 11/01/23 1000       ReDS Vest / Clip   Station Marker C    Ruler Value 29    ReDS Value Range Low volume    ReDS Actual Value 33

## 2023-11-01 NOTE — Progress Notes (Unsigned)
 Subjective:    Patient ID: Todd [name], male    DOB: 1940-09-20, 83 y.o.   MRN: [id number]     HPI Todd [name] is here for hospital follow up He is here with his wife and daughter.   Admitted 2/28 - 3/06  Recommendations for Outpatient Follow-up:  Follow up with PCP in 1-2 weeks Please obtain BMP/CBC in one week Cardiology to schedule follow-up  Home health - PT, OT  Admitted with progressive weakness, SOB, edema, uncontrolled sugars.  Hospital course complicated by Afib w/ RVR.  Found to be covid positive.   PAF w/ RVR Remained in Afib, rate controlled at rest, tachycardia with mobility. 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    Gets tired easily.     Sugars have been low first thing in the morning and then high after he takes the prednisone - high 200's.  He is eating a little better.  No choking episodes at home.    Legs weak  - PT came last night.  Medications and allergies reviewed with patient and updated if appropriate.  Current Outpatient Medications on File Prior to Visit  Medication Sig Dispense Refill    acetaminophen (TYLENOL) 650 MG CR tablet Take 650 mg by mouth every 8 (eight) hours as needed for pain.     atorvastatin (LIPITOR) 80 MG tablet Take 1 tablet (80 mg total) by mouth daily. (Patient taking differently: Take 80 mg by mouth at bedtime.) 90 tablet 3   cholecalciferol (VITAMIN D3) 25 MCG (1000 UNIT) tablet Take 1,000 Units by mouth daily.     Continuous Glucose Receiver (FREESTYLE LIBRE 3 READER) DEVI 1 each by Does not apply route 4 (four) times daily -  before meals and at bedtime. 1 each 0   Continuous Glucose Sensor (FREESTYLE LIBRE 3 SENSOR) MISC 1 each by Does not apply route 4 (four) times daily -  before meals and at bedtime. Place 1 sensor on the skin every 14 days. Use to check glucose continuously 2 each 0   Cyanocobalamin (VITAMIN B-12 PO) Take 1 tablet by mouth daily.     digoxin (LANOXIN) 0.125 MG tablet Take 1 tablet (0.125 mg total) by mouth daily. 30 tablet 0   furosemide (LASIX) 40 MG tablet Take 1 tablet (40 mg total) by mouth daily.     insulin aspart (NOVOLOG) 100 UNIT/ML FlexPen Inject 4 Units into the skin 3 (three) times daily with meals. Only take if eating a meal AND Blood Glucose (BG) is 80 or higher. 15 mL 0   insulin glargine (LANTUS SOLOSTAR) 100 UNIT/ML Solostar Pen Inject 20 Units into the  skin daily. 15 mL 0   Insulin Pen Needle (PEN NEEDLES) 31G X 5 MM MISC 1 each by Does not apply route 3 (three) times daily. May dispense any manufacturer covered by patient's insurance. 100 each 0   Insulin Pen Needle (PEN NEEDLES) 32G X 5 MM MISC UAD for daily lantus injection 90 each 3   ipratropium (ATROVENT) 0.06 % nasal spray Place 2-4 sprays into both nostrils every 8 (eight) hours as needed for rhinitis.     metFORMIN (GLUCOPHAGE-XR) 500 MG 24 hr tablet Take 1 tablet (500 mg total) by mouth 2 (two) times daily with a meal. 60 tablet 5   metoprolol tartrate (LOPRESSOR) 100 MG tablet TAKE 1 TABLET BY MOUTH 2 TIMES DAILY. 180 tablet 3   Multiple Vitamin (MULTIVITAMIN  PO) Take 1 tablet by mouth daily.     Nintedanib (OFEV) 100 MG CAPS Take 1 capsule (100 mg total) by mouth 2 (two) times daily. 180 capsule 1   nitroGLYCERIN (NITROSTAT) 0.4 MG SL tablet Place 1 tablet (0.4 mg total) under the tongue every 5 (five) minutes as needed for chest pain. 100 tablet 3   Omega-3 Fatty Acids (FISH OIL PO) Take 1 tablet by mouth 2 (two) times daily.     omeprazole (PRILOSEC) 40 MG capsule TAKE 1 CAPSULE BY MOUTH 2 TIMES DAILY. (Patient taking differently: Take 40 mg by mouth daily.) 120 capsule 5   ONETOUCH DELICA LANCETS 33G MISC TEST BLOOD SUGAR ONCE DAILY 100 each 3   ONETOUCH VERIO test strip USE UP TO 4 TIMES A DAY AS DIRECTED 100 strip 0   PIP PEN NEEDLES 31G X 31G X 5 MM MISC USE TO INJECT INSULIN 3 TIMES DAILY. MAY DISPENSE ANY MANUFACTURER COVERED BY PATIENT''S INSURANCE.     predniSONE (DELTASONE) 10 MG tablet TAKE 1 TABLET (10 MG TOTAL) BY MOUTH DAILY WITH BREAKFAST. 30 tablet 5   rivaroxaban (XARELTO) 20 MG TABS tablet TAKE 1 TABLET (20 MG TOTAL) BY MOUTH DAILY WITH SUPPER. 30 tablet 5   spironolactone (ALDACTONE) 25 MG tablet Take 0.5 tablets (12.5 mg total) by mouth daily. 45 tablet 3   No current facility-administered medications on file prior to visit.     Review of Systems  Constitutional:  Negative for fever.  Respiratory:  Positive for cough and shortness of breath.   Cardiovascular:  Positive for leg swelling (ankles and feet - much improved). Negative for chest pain and palpitations.  Neurological:  Negative for light-headedness and headaches.       Objective:   Vitals:   11/02/23 1255  BP: 114/68  Pulse: 68  Temp: 98.1 F (36.7 C)  SpO2: 90%   BP Readings from Last 3 Encounters:  11/02/23 114/68  11/01/23 120/80  10/25/23 106/66   Wt Readings from Last 3 Encounters:  11/02/23 139 lb (63 kg)  11/01/23 138 lb (62.6 kg)  10/25/23 138 lb 14.2 oz (63 kg)   Body mass index is 22.44 kg/m.    Physical Exam Constitutional:       General: He is not in acute distress.    Appearance: Normal appearance. He is not ill-appearing.  HENT:     Head: Normocephalic and atraumatic.  Eyes:     Conjunctiva/sclera: Conjunctivae normal.  Cardiovascular:     Rate and Rhythm: Normal rate and regular rhythm.     Heart sounds: Normal heart sounds.  Pulmonary:     Effort: Pulmonary effort is normal. No respiratory distress.     Breath  sounds: Rales (bibasilar) present. No wheezing.  Abdominal:     General: There is no distension.     Palpations: Abdomen is soft.     Tenderness: There is no abdominal tenderness.  Musculoskeletal:     Right lower leg: Edema (1 + pitting feet and lower legs) present.     Left lower leg: Edema (1 + pitting feet and lower legs) present.  Skin:    General: Skin is warm and dry.     Findings: No rash.  Neurological:     Mental Status: He is alert. Mental status is at baseline.  Psychiatric:        Mood and Affect: Mood normal.        Lab Results  Component Value Date   WBC 8.4 10/21/2023   HGB 12.2 (L) 10/21/2023   HCT 35.7 (L) 10/21/2023   PLT 252 10/21/2023   GLUCOSE 191 (H) 11/01/2023   CHOL 126 09/19/2023   TRIG 183.0 (H) 09/19/2023   HDL 38.20 (L) 09/19/2023   LDLCALC 51 09/19/2023   ALT 45 (H) 10/19/2023   AST 34 10/19/2023   NA 140 11/01/2023   K 4.8 11/01/2023   CL 102 11/01/2023   CREATININE 0.97 11/01/2023   BUN 32 (H) 11/01/2023   CO2 25 11/01/2023   TSH 2.87 09/19/2023   PSA 0.24 07/20/2008   INR 1.2 01/08/2023   HGBA1C 12.7 (H) 10/18/2023   MICROALBUR 3.2 (H) 06/06/2023     Assessment & Plan:    See Problem List for Assessment and Plan of chronic medical problems.

## 2023-11-02 ENCOUNTER — Telehealth: Payer: Self-pay | Admitting: Internal Medicine

## 2023-11-02 ENCOUNTER — Ambulatory Visit: Admitting: Internal Medicine

## 2023-11-02 VITALS — BP 114/68 | HR 68 | Temp 98.1°F | Ht 66.0 in | Wt 139.0 lb

## 2023-11-02 DIAGNOSIS — I4891 Unspecified atrial fibrillation: Secondary | ICD-10-CM

## 2023-11-02 DIAGNOSIS — Z794 Long term (current) use of insulin: Secondary | ICD-10-CM

## 2023-11-02 DIAGNOSIS — I5021 Acute systolic (congestive) heart failure: Secondary | ICD-10-CM

## 2023-11-02 DIAGNOSIS — E1151 Type 2 diabetes mellitus with diabetic peripheral angiopathy without gangrene: Secondary | ICD-10-CM

## 2023-11-02 DIAGNOSIS — I10 Essential (primary) hypertension: Secondary | ICD-10-CM

## 2023-11-02 DIAGNOSIS — R1312 Dysphagia, oropharyngeal phase: Secondary | ICD-10-CM

## 2023-11-02 DIAGNOSIS — L89151 Pressure ulcer of sacral region, stage 1: Secondary | ICD-10-CM

## 2023-11-02 MED ORDER — DEXCOM G7 SENSOR MISC: 3 refills | Status: DC

## 2023-11-02 MED ORDER — DEXCOM G7 RECEIVER DEVI: 0 refills | Status: DC

## 2023-11-02 MED ORDER — CVS HYDROCOLLOID PADS EX PADS: MEDICATED_PAD | CUTANEOUS | 0 refills | Status: DC

## 2023-11-02 NOTE — Telephone Encounter (Signed)
 Verbals given to [place] today.

## 2023-11-02 NOTE — Assessment & Plan Note (Signed)
 Had choking episode in ED - speech saw him and advised dysphagia diet No other choking episodes Eating regular foods Has swallow evaluation in [place] - will see what that shows before adding ST

## 2023-11-02 NOTE — Telephone Encounter (Signed)
 Okay for orders?

## 2023-11-02 NOTE — Assessment & Plan Note (Signed)
 Recently hospitalized for acute on chronic HF Echo with EF 45% Just saw heart clinic yesterday Fluid status improved - leg edema better - still with edema in ankles/feet Continue lasix 40 mg daily, spironolactone 12.5 mg daily

## 2023-11-02 NOTE — Assessment & Plan Note (Signed)
 Chronic Using hydrocolloid pads - continue - his daughter is monitoring Avoid pressure on the area for prolonged period of time

## 2023-11-02 NOTE — Assessment & Plan Note (Signed)
 Chronic Blood pressure controlled Continue metoprolol 100 mg twice daily, spironolactone 12.5 mg daily

## 2023-11-02 NOTE — Assessment & Plan Note (Signed)
 Chronic  following with cardiology When he went into hospital his rate was not controlled with activity Started on digoxin Continued on xarelto, metoprolol

## 2023-11-02 NOTE — Patient Instructions (Addendum)
    Medications changes include :   dexcom sensor for sugar.  decrease lantus to 15 units.   Novolog per sliding scale  Inject 4-10 units into the skin 3 times a day before meals < 80 no insulin 80-150  4 units 151-200  6 units 201-250  8 units >250      10 units     A referral was ordered endocrine and someone will call you to schedule an appointment.     Return for follow up as scheduled.

## 2023-11-02 NOTE — Assessment & Plan Note (Signed)
 Chronic Uncontrolled Has been on steroids - 10 mg daily, but was on high dose prior to going  to the hospital Currently taking lantus 20 units q am and novolog 10 units with meals if glucose is > 80 Am sugars in 50-60's, afternoon sugars in 200's Checking 3/day - has freestyle monitor Decrease lantus to 15 units daily Novolog SS - < 80 - no units, 80- 150 4 units, etc.  Discussed we may need to adjust the SS depending on sugars - he has not been eating consisently at time which may make sure control more difficult Referral to endocrine His daughter will update me with his readings F/u in April as scheduled

## 2023-11-02 NOTE — Telephone Encounter (Signed)
 Copied from CRM [id number]. Topic: Clinical - Home Health Verbal Orders >> Nov 02, 2023 10:07 AM Myrtice Lauth wrote: Caller/Agency: Nino Glow, PT, [facility] Callback Number: [phone] Service Requested: Physical Therapy Frequency: 1 week 1, 2 week 2, 1 week 4 Any new concerns about the patient? No Requesting verbal orders

## 2023-11-04 ENCOUNTER — Encounter: Payer: Self-pay | Admitting: Internal Medicine

## 2023-11-04 ENCOUNTER — Other Ambulatory Visit: Payer: Self-pay | Admitting: Internal Medicine

## 2023-11-04 MED ORDER — DAPAGLIFLOZIN PROPANEDIOL 10 MG PO TABS: 10.0000 mg | ORAL_TABLET | Freq: Every day | ORAL | 1 refills | Status: DC

## 2023-11-05 ENCOUNTER — Ambulatory Visit (HOSPITAL_COMMUNITY)
Admit: 2023-11-05 | Discharge: 2023-11-05 | Disposition: A | Discharge status: Home / Self Care | Source: Ambulatory Visit | Attending: Internal Medicine | Admitting: Internal Medicine

## 2023-11-05 ENCOUNTER — Telehealth (HOSPITAL_COMMUNITY): Payer: Self-pay | Admitting: Cardiology

## 2023-11-05 VITALS — BP 110/70 | HR 72 | Ht 66.0 in | Wt 137.8 lb

## 2023-11-05 DIAGNOSIS — E785 Hyperlipidemia, unspecified: Secondary | ICD-10-CM | POA: Insufficient documentation

## 2023-11-05 DIAGNOSIS — E119 Type 2 diabetes mellitus without complications: Secondary | ICD-10-CM | POA: Insufficient documentation

## 2023-11-05 DIAGNOSIS — I2510 Atherosclerotic heart disease of native coronary artery without angina pectoris: Secondary | ICD-10-CM | POA: Insufficient documentation

## 2023-11-05 DIAGNOSIS — I5022 Chronic systolic (congestive) heart failure: Secondary | ICD-10-CM | POA: Insufficient documentation

## 2023-11-05 DIAGNOSIS — I4819 Other persistent atrial fibrillation: Secondary | ICD-10-CM

## 2023-11-05 DIAGNOSIS — Z7984 Long term (current) use of oral hypoglycemic drugs: Secondary | ICD-10-CM | POA: Diagnosis not present

## 2023-11-05 DIAGNOSIS — Z7901 Long term (current) use of anticoagulants: Secondary | ICD-10-CM | POA: Diagnosis not present

## 2023-11-05 DIAGNOSIS — I110 Hypertensive heart disease with heart failure: Secondary | ICD-10-CM | POA: Insufficient documentation

## 2023-11-05 DIAGNOSIS — J9601 Acute respiratory failure with hypoxia: Secondary | ICD-10-CM | POA: Diagnosis not present

## 2023-11-05 DIAGNOSIS — D6869 Other thrombophilia: Secondary | ICD-10-CM | POA: Diagnosis not present

## 2023-11-05 DIAGNOSIS — Z951 Presence of aortocoronary bypass graft: Secondary | ICD-10-CM | POA: Diagnosis not present

## 2023-11-05 DIAGNOSIS — Z794 Long term (current) use of insulin: Secondary | ICD-10-CM | POA: Diagnosis not present

## 2023-11-05 DIAGNOSIS — I5021 Acute systolic (congestive) heart failure: Secondary | ICD-10-CM | POA: Diagnosis not present

## 2023-11-05 NOTE — Telephone Encounter (Signed)
 Daughter called with follow up question Reports her father is scheduled for DCCV on 3/24, was told there is a 2% chance fluid can build up in the lungs during this procedure.   With patients history of pulmonary fibrosis, wanted to be sure a DCCV is the best procedure   Reports she was told by afib clinic to contact [facility] for review   Please advise

## 2023-11-05 NOTE — Progress Notes (Addendum)
 Primary Care Physician: [name], MD Primary Cardiologist: Rollene Rotunda, MD Electrophysiologist: Lanier Prude, MD     Referring Physician: Dr. Toy Baker R Rickerson is a 83 y.o. male with a history of pulmonary fibrosis, HFrEF, HTN, HLD, T2DM, CAD s/p CABG, and persistent atrial fibrillation who presents for consultation in the [facility]. Hospital admission 2/27-10/24/23 for weakness and SOB found to be Covid-19 positive and course complicated by Afib with RVR. Discharged in rate controlled Afib taking Lopressor 100 mg BID and maintenance dose of digoxin. Patient is on Xarelto 20 mg daily for a CHADS2VASC score of 6.  On evaluation today, he is currently in Afib. He uses oxygen prn. Previously seen by Dr. Lalla Brothers on 09/26/23 for Watchman consultation and thought to be good candidate for Tikosyn for long term rhythm control in the future. He is on daily prednisone 10 mg for pulmonary fibrosis. Daughter notes patient began Xarelto 20 mg daily on 2/27 when admitted to hospital and no missed doses since then.   Today, he denies symptoms of palpitations, chest pain, shortness of breath, orthopnea, PND, lower extremity edema, dizziness, presyncope, syncope, snoring, daytime somnolence, bleeding, or neurologic sequela. The patient is tolerating medications without difficulties and is otherwise without complaint today.    he has a BMI of Body mass index is 22.24 kg/m.Marland Kitchen Filed Weights   11/05/23 1329  Weight: 62.5 kg    Current Outpatient Medications  Medication Sig Dispense Refill   acetaminophen (TYLENOL) 650 MG CR tablet Take 650 mg by mouth as needed for pain.     atorvastatin (LIPITOR) 80 MG tablet Take 1 tablet (80 mg total) by mouth daily. (Patient taking differently: Take 80 mg by mouth at bedtime.) 90 tablet 3   cholecalciferol (VITAMIN D3) 25 MCG (1000 UNIT) tablet Take 1,000 Units by mouth daily.     Continuous Glucose Receiver (DEXCOM G7  RECEIVER) DEVI UAD to monitor sugars  E11.51 1 each 0   Continuous Glucose Receiver (FREESTYLE LIBRE 3 READER) DEVI 1 each by Does not apply route 4 (four) times daily -  before meals and at bedtime. 1 each 0   Continuous Glucose Sensor (DEXCOM G7 SENSOR) MISC UAD to monitor sugars  E11.51 9 each 3   Continuous Glucose Sensor (FREESTYLE LIBRE 3 SENSOR) MISC 1 each by Does not apply route 4 (four) times daily -  before meals and at bedtime. Place 1 sensor on the skin every 14 days. Use to check glucose continuously 2 each 0   Cyanocobalamin (VITAMIN B-12 PO) Take 1 tablet by mouth daily.     digoxin (LANOXIN) 0.125 MG tablet Take 1 tablet (0.125 mg total) by mouth daily. 30 tablet 0   furosemide (LASIX) 40 MG tablet Take 1 tablet (40 mg total) by mouth daily.     Hydroactive Dressings ([employer]S HYDROCOLLOID PADS) PADS Apply one pad daily for wound 30 each 0   insulin aspart (NOVOLOG) 100 UNIT/ML FlexPen Inject 4 Units into the skin 3 (three) times daily with meals. Only take if eating a meal AND Blood Glucose (BG) is 80 or higher. 15 mL 0   insulin glargine (LANTUS SOLOSTAR) 100 UNIT/ML Solostar Pen Inject 20 Units into the skin daily. (Patient taking differently: Inject 15 Units into the skin daily.) 15 mL 0   Insulin Pen Needle (PEN NEEDLES) 31G X 5 MM MISC 1 each by Does not apply route 3 (three) times daily. May dispense any manufacturer  covered by patient's insurance. 100 each 0   Insulin Pen Needle (PEN NEEDLES) 32G X 5 MM MISC UAD for daily lantus injection 90 each 3   ipratropium (ATROVENT) 0.06 % nasal spray Place 2-4 sprays into both nostrils every 8 (eight) hours as needed for rhinitis.     metFORMIN (GLUCOPHAGE-XR) 500 MG 24 hr tablet Take 1 tablet (500 mg total) by mouth 2 (two) times daily with a meal. 60 tablet 5   metoprolol tartrate (LOPRESSOR) 100 MG tablet TAKE 1 TABLET BY MOUTH 2 TIMES DAILY. 180 tablet 3   Multiple Vitamin (MULTIVITAMIN PO) Take 1 tablet by mouth daily.     Nintedanib  (OFEV) 100 MG CAPS Take 1 capsule (100 mg total) by mouth 2 (two) times daily. 180 capsule 1   nitroGLYCERIN (NITROSTAT) 0.4 MG SL tablet Place 1 tablet (0.4 mg total) under the tongue every 5 (five) minutes as needed for chest pain. 100 tablet 3   Omega-3 Fatty Acids (FISH OIL PO) Take 1 tablet by mouth 2 (two) times daily.     omeprazole (PRILOSEC) 40 MG capsule TAKE 1 CAPSULE BY MOUTH 2 TIMES DAILY. (Patient taking differently: Take 40 mg by mouth daily.) 120 capsule 5   ONETOUCH DELICA LANCETS 33G MISC TEST BLOOD SUGAR ONCE DAILY 100 each 3   ONETOUCH VERIO test strip USE UP TO 4 TIMES A DAY AS DIRECTED 100 strip 0   PIP PEN NEEDLES 31G X 31G X 5 MM MISC USE TO INJECT INSULIN 3 TIMES DAILY. MAY DISPENSE ANY MANUFACTURER COVERED BY PATIENT''S INSURANCE.     predniSONE (DELTASONE) 10 MG tablet TAKE 1 TABLET (10 MG TOTAL) BY MOUTH DAILY WITH BREAKFAST. 30 tablet 5   rivaroxaban (XARELTO) 20 MG TABS tablet TAKE 1 TABLET (20 MG TOTAL) BY MOUTH DAILY WITH SUPPER. 30 tablet 5   spironolactone (ALDACTONE) 25 MG tablet Take 0.5 tablets (12.5 mg total) by mouth daily. 45 tablet 3   dapagliflozin propanediol ([name]) 10 MG TABS tablet Take 1 tablet (10 mg total) by mouth daily before breakfast. (Patient not taking: Reported on 11/05/2023) 90 tablet 1   No current facility-administered medications for this encounter.    Atrial Fibrillation Management history:  Previous antiarrhythmic drugs: none Previous cardioversions: none Previous ablations: none Anticoagulation history: Xarelto 20 mg   ROS- All systems are reviewed and negative except as per the HPI above.  Physical Exam: BP 110/70   Pulse 72   Ht 5\' 6"  (1.676 m)   Wt 62.5 kg   BMI 22.24 kg/m   GEN: Well nourished, well developed in no acute distress NECK: No JVD; No carotid bruits CARDIAC: Irregularly irregular rate and rhythm, no murmurs, rubs, gallops RESPIRATORY:  Rales present bilaterally ABDOMEN: Soft, non-tender,  non-distended EXTREMITIES:  No edema; No deformity   EKG today demonstrates  Vent. rate 72 BPM PR interval * ms QRS duration 88 ms QT/QTcB 384/420 ms P-R-T axes * 60 68 Atrial fibrillation Cannot rule out Anterior infarct , age undetermined Abnormal ECG When compared with ECG of 01-Nov-2023 10:20, PREVIOUS ECG IS PRESENT  Echo 10/19/23 demonstrated  1. Left ventricular ejection fraction, by estimation, is 45%. The left  ventricle has mildly decreased function. The left ventricle demonstrates  global hypokinesis. There is mild concentric left ventricular hypertrophy.  Left ventricular diastolic  parameters are indeterminate.   2. Right ventricular systolic function is mildly reduced. The right  ventricular size is mildly enlarged. There is normal pulmonary artery  systolic pressure. The estimated  right ventricular systolic pressure is  33.3 mmHg.   3. Left atrial size was mildly dilated.   4. The mitral valve is degenerative. Mild mitral valve regurgitation. No  evidence of mitral stenosis. Moderate mitral annular calcification.   5. The aortic valve is tricuspid. There is moderate calcification of the  aortic valve. Aortic valve regurgitation is not visualized. Aortic valve  sclerosis/calcification is present, without any evidence of aortic  stenosis.   6. The inferior vena cava is dilated in size with <50% respiratory  variability, suggesting right atrial pressure of 15 mmHg.   7. The patient was in atrial fibrillation.    ASSESSMENT & PLAN CHA2DS2-VASc Score = 6  The patient's score is based upon: CHF History: 1 HTN History: 1 Diabetes History: 1 Stroke History: 0 Vascular Disease History: 1 Age Score: 2 Gender Score: 0       ASSESSMENT AND PLAN: Persistent Atrial Fibrillation (ICD10:  I48.19) The patient's CHA2DS2-VASc score is 6, indicating a 9.7% annual risk of stroke.    He is currently in Afib. We discussed risks vs benefits of cardioversion procedure to  try to convert him to NSR. We discussed options for treatment if he has ERAF. Noted that when previously seen by Dr. Lalla Brothers it was recommended Tikosyn as an option for rhythm control in the future. After discussion of risks vs benefits, patient understands risks and would like to proceed with cardioversion. Daughter asked whether his pulmonary fibrosis increases his risk for flash pulmonary edema since I went over this potential risk. I advised that ultimately I did not have the answer for this question; it may or may not increase his risk. I did note he may be at risk for this potential adverse effect given systolic dysfunction. Labs drawn while in hospital within 30 days. He has not started [name] yet so recommended to start after DCCV.   Informed Consent   Shared Decision Making/Informed Consent The risks (stroke, cardiac arrhythmias rarely resulting in the need for a temporary or permanent pacemaker, skin irritation or burns and complications associated with conscious sedation including aspiration, arrhythmia, respiratory failure and death), benefits (restoration of normal sinus rhythm) and alternatives of a direct current cardioversion were explained in detail to Mr. Goto and he agrees to proceed.        CrCl 54 mL/min Qtc in NSR 447 ms   Secondary Hypercoagulable State (ICD10:  D68.69) The patient is at significant risk for stroke/thromboembolism based upon his CHA2DS2-VASc Score of 6.  Continue Rivaroxaban (Xarelto).  No missed doses.    Follow up after DCCV with HF clinic.    Lake Bells, PA-C  [facility] [address] [place], [place] [address] [phone]

## 2023-11-05 NOTE — H&P (View-Only) (Signed)
 Primary Care Physician: [name], MD Primary Cardiologist: Rollene Rotunda, MD Electrophysiologist: Lanier Prude, MD     Referring Physician: Dr. Toy Baker R Todd [name] is a 83 y.o. male with a history of pulmonary fibrosis, HFrEF, HTN, HLD, T2DM, CAD s/p CABG, and persistent atrial fibrillation who presents for consultation in the [facility]. Hospital admission 2/27-10/24/23 for weakness and SOB found to be Covid-19 positive and course complicated by Afib with RVR. Discharged in rate controlled Afib taking Lopressor 100 mg BID and maintenance dose of digoxin. Patient is on Xarelto 20 mg daily for a CHADS2VASC score of 6.  On evaluation today, he is currently in Afib. He uses oxygen prn. Previously seen by Dr. Lalla Brothers on 09/26/23 for Watchman consultation and thought to be good candidate for Tikosyn for long term rhythm control in the future. He is on daily prednisone 10 mg for pulmonary fibrosis. Daughter notes patient began Xarelto 20 mg daily on 2/27 when admitted to hospital and no missed doses since then.   Today, he denies symptoms of palpitations, chest pain, shortness of breath, orthopnea, PND, lower extremity edema, dizziness, presyncope, syncope, snoring, daytime somnolence, bleeding, or neurologic sequela. The patient is tolerating medications without difficulties and is otherwise without complaint today.    he has a BMI of Body mass index is 22.24 kg/m.Marland Kitchen Filed Weights   11/05/23 1329  Weight: 62.5 kg    Current Outpatient Medications  Medication Sig Dispense Refill   acetaminophen (TYLENOL) 650 MG CR tablet Take 650 mg by mouth as needed for pain.     atorvastatin (LIPITOR) 80 MG tablet Take 1 tablet (80 mg total) by mouth daily. (Patient taking differently: Take 80 mg by mouth at bedtime.) 90 tablet 3   cholecalciferol (VITAMIN D3) 25 MCG (1000 UNIT) tablet Take 1,000 Units by mouth daily.     Continuous Glucose Receiver (DEXCOM G7  RECEIVER) DEVI UAD to monitor sugars  E11.51 1 each 0   Continuous Glucose Receiver (FREESTYLE LIBRE 3 READER) DEVI 1 each by Does not apply route 4 (four) times daily -  before meals and at bedtime. 1 each 0   Continuous Glucose Sensor (DEXCOM G7 SENSOR) MISC UAD to monitor sugars  E11.51 9 each 3   Continuous Glucose Sensor (FREESTYLE LIBRE 3 SENSOR) MISC 1 each by Does not apply route 4 (four) times daily -  before meals and at bedtime. Place 1 sensor on the skin every 14 days. Use to check glucose continuously 2 each 0   Cyanocobalamin (VITAMIN B-12 PO) Take 1 tablet by mouth daily.     digoxin (LANOXIN) 0.125 MG tablet Take 1 tablet (0.125 mg total) by mouth daily. 30 tablet 0   furosemide (LASIX) 40 MG tablet Take 1 tablet (40 mg total) by mouth daily.     Hydroactive Dressings (CVS HYDROCOLLOID PADS) PADS Apply one pad daily for wound 30 each 0   insulin aspart (NOVOLOG) 100 UNIT/ML FlexPen Inject 4 Units into the skin 3 (three) times daily with meals. Only take if eating a meal AND Blood Glucose (BG) is 80 or higher. 15 mL 0   insulin glargine (LANTUS SOLOSTAR) 100 UNIT/ML Solostar Pen Inject 20 Units into the skin daily. (Patient taking differently: Inject 15 Units into the skin daily.) 15 mL 0   Insulin Pen Needle (PEN NEEDLES) 31G X 5 MM MISC 1 each by Does not apply route 3 (three) times daily. May dispense any manufacturer  covered by patient's insurance. 100 each 0   Insulin Pen Needle (PEN NEEDLES) 32G X 5 MM MISC UAD for daily lantus injection 90 each 3   ipratropium (ATROVENT) 0.06 % nasal spray Place 2-4 sprays into both nostrils every 8 (eight) hours as needed for rhinitis.     metFORMIN (GLUCOPHAGE-XR) 500 MG 24 hr tablet Take 1 tablet (500 mg total) by mouth 2 (two) times daily with a meal. 60 tablet 5   metoprolol tartrate (LOPRESSOR) 100 MG tablet TAKE 1 TABLET BY MOUTH 2 TIMES DAILY. 180 tablet 3   Multiple Vitamin (MULTIVITAMIN PO) Take 1 tablet by mouth daily.     Nintedanib  (OFEV) 100 MG CAPS Take 1 capsule (100 mg total) by mouth 2 (two) times daily. 180 capsule 1   nitroGLYCERIN (NITROSTAT) 0.4 MG SL tablet Place 1 tablet (0.4 mg total) under the tongue every 5 (five) minutes as needed for chest pain. 100 tablet 3   Omega-3 Fatty Acids (FISH OIL PO) Take 1 tablet by mouth 2 (two) times daily.     omeprazole (PRILOSEC) 40 MG capsule TAKE 1 CAPSULE BY MOUTH 2 TIMES DAILY. (Patient taking differently: Take 40 mg by mouth daily.) 120 capsule 5   ONETOUCH DELICA LANCETS 33G MISC TEST BLOOD SUGAR ONCE DAILY 100 each 3   ONETOUCH VERIO test strip USE UP TO 4 TIMES A DAY AS DIRECTED 100 strip 0   PIP PEN NEEDLES 31G X 31G X 5 MM MISC USE TO INJECT INSULIN 3 TIMES DAILY. MAY DISPENSE ANY MANUFACTURER COVERED BY PATIENT''S INSURANCE.     predniSONE (DELTASONE) 10 MG tablet TAKE 1 TABLET (10 MG TOTAL) BY MOUTH DAILY WITH BREAKFAST. 30 tablet 5   rivaroxaban (XARELTO) 20 MG TABS tablet TAKE 1 TABLET (20 MG TOTAL) BY MOUTH DAILY WITH SUPPER. 30 tablet 5   spironolactone (ALDACTONE) 25 MG tablet Take 0.5 tablets (12.5 mg total) by mouth daily. 45 tablet 3   dapagliflozin propanediol ([name]) 10 MG TABS tablet Take 1 tablet (10 mg total) by mouth daily before breakfast. (Patient not taking: Reported on 11/05/2023) 90 tablet 1   No current facility-administered medications for this encounter.    Atrial Fibrillation Management history:  Previous antiarrhythmic drugs: none Previous cardioversions: none Previous ablations: none Anticoagulation history: Xarelto 20 mg   ROS- All systems are reviewed and negative except as per the HPI above.  Physical Exam: BP 110/70   Pulse 72   Ht 5\' 6"  (1.676 m)   Wt 62.5 kg   BMI 22.24 kg/m   GEN: Well nourished, well developed in no acute distress NECK: No JVD; No carotid bruits CARDIAC: Irregularly irregular rate and rhythm, no murmurs, rubs, gallops RESPIRATORY:  Rales present bilaterally ABDOMEN: Soft, non-tender,  non-distended EXTREMITIES:  No edema; No deformity   EKG today demonstrates  Vent. rate 72 BPM PR interval * ms QRS duration 88 ms QT/QTcB 384/420 ms P-R-T axes * 60 68 Atrial fibrillation Cannot rule out Anterior infarct , age undetermined Abnormal ECG When compared with ECG of 01-Nov-2023 10:20, PREVIOUS ECG IS PRESENT  Echo 10/19/23 demonstrated  1. Left ventricular ejection fraction, by estimation, is 45%. The left  ventricle has mildly decreased function. The left ventricle demonstrates  global hypokinesis. There is mild concentric left ventricular hypertrophy.  Left ventricular diastolic  parameters are indeterminate.   2. Right ventricular systolic function is mildly reduced. The right  ventricular size is mildly enlarged. There is normal pulmonary artery  systolic pressure. The estimated  right ventricular systolic pressure is  33.3 mmHg.   3. Left atrial size was mildly dilated.   4. The mitral valve is degenerative. Mild mitral valve regurgitation. No  evidence of mitral stenosis. Moderate mitral annular calcification.   5. The aortic valve is tricuspid. There is moderate calcification of the  aortic valve. Aortic valve regurgitation is not visualized. Aortic valve  sclerosis/calcification is present, without any evidence of aortic  stenosis.   6. The inferior vena cava is dilated in size with <50% respiratory  variability, suggesting right atrial pressure of 15 mmHg.   7. The patient was in atrial fibrillation.    ASSESSMENT & PLAN CHA2DS2-VASc Score = 6  The patient's score is based upon: CHF History: 1 HTN History: 1 Diabetes History: 1 Stroke History: 0 Vascular Disease History: 1 Age Score: 2 Gender Score: 0       ASSESSMENT AND PLAN: Persistent Atrial Fibrillation (ICD10:  I48.19) The patient's CHA2DS2-VASc score is 6, indicating a 9.7% annual risk of stroke.    He is currently in Afib. We discussed risks vs benefits of cardioversion procedure to  try to convert him to NSR. We discussed options for treatment if he has ERAF. Noted that when previously seen by Dr. Lalla Brothers it was recommended Tikosyn as an option for rhythm control in the future. After discussion of risks vs benefits, patient understands risks and would like to proceed with cardioversion. Daughter asked whether his pulmonary fibrosis increases his risk for flash pulmonary edema since I went over this potential risk. I advised that ultimately I did not have the answer for this question; it may or may not increase his risk. I did note he may be at risk for this potential adverse effect given systolic dysfunction. Labs drawn while in hospital within 30 days. He has not started [name] yet so recommended to start after DCCV.   Informed Consent   Shared Decision Making/Informed Consent The risks (stroke, cardiac arrhythmias rarely resulting in the need for a temporary or permanent pacemaker, skin irritation or burns and complications associated with conscious sedation including aspiration, arrhythmia, respiratory failure and death), benefits (restoration of normal sinus rhythm) and alternatives of a direct current cardioversion were explained in detail to Mr. Goto and he agrees to proceed.        CrCl 54 mL/min Qtc in NSR 447 ms   Secondary Hypercoagulable State (ICD10:  D68.69) The patient is at significant risk for stroke/thromboembolism based upon his CHA2DS2-VASc Score of 6.  Continue Rivaroxaban (Xarelto).  No missed doses.    Follow up after DCCV with HF clinic.    Lake Bells, PA-C  [facility] [address] [place], [place] [address] [phone]

## 2023-11-05 NOTE — Patient Instructions (Signed)
 Cardioversion scheduled for: Monday, March 24th   - Arrive at the [facility] and go to admitting at 9:30am   - Do not eat or drink anything after midnight the night prior to your procedure.   - Take all your morning medication (except diabetic medications) with a sip of water prior to arrival.  - You will not be able to drive home after your procedure.    - Do NOT miss any doses of your blood thinner - if you should miss a dose please notify our office immediately.   - If you feel as if you go back into normal rhythm prior to scheduled cardioversion, please notify our office immediately.   If your procedure is canceled in the cardioversion suite you will be charged a cancellation fee.

## 2023-11-06 ENCOUNTER — Other Ambulatory Visit: Payer: Self-pay | Admitting: *Deleted

## 2023-11-06 MED ORDER — DAPAGLIFLOZIN PROPANEDIOL 10 MG PO TABS: 10.0000 mg | ORAL_TABLET | Freq: Every day | ORAL | Status: DC

## 2023-11-06 NOTE — Telephone Encounter (Signed)
 Daughter aware and voiced understanding.

## 2023-11-06 NOTE — Patient Outreach (Addendum)
 Care Management  Transitions of Care Program Transitions of Care Post-discharge week 2/ day # 8   11/06/2023 Name: Todd [name] MRN: [id number] DOB: 1941-05-31  Subjective: Todd [name] is a 83 y.o. year old male who is a primary care patient of Burns, [name], MD. The Care Management team Engaged with patient Engaged with patient by telephone to assess and address transitions of care needs.   Consent to Services:  Patient was given information about care management services, agreed to services, and gave verbal consent to participate.  Enrolled into TOC 30-day program:  10/29/23  Assessment: Per caregivers: "He is better; still using the home oxygen mainly just when he has periods of high activity like taking a shower; he didn't even have to use it yesterday when we took him to the doctor office.  He has a cardioversion scheduled next week.  His sugars have been running low every morning but since Dr. Lawerance [name] adjusted the long acting insulin, that is getting better now"  Caregivers both deny specific clinical concerns today  4:55 pm-- Spouse contacted me after we had completed out scheduled call earlier this afternoon: reports that patient will not have enough digoxin to last him until he has next scheduled office visit with CHF provider on 11/15/23: hospital discharging provider did not prescribe enough to last, and he will run out early into next week; care coordination outreach completed with CHF provider, requesting that prescription be sent in for this medication          SDOH Interventions    Flowsheet Row Telephone from 10/29/2023 in [facility] to Hosp-Admission (Discharged) from 10/18/2023 in [facility] Clinical Support from 08/03/2023 in [facility] [place] Telephone from 01/11/2023 in [facility] [facility] [facility] Care Coordination Clinical Support from 08/03/2022 in [facility] [place] [facility]  [facility] Chronic Care Management from 02/13/2022 in [facility] [place]  SDOH Interventions        Food Insecurity Interventions Intervention Not Indicated -- Intervention Not Indicated Intervention Not Indicated Intervention Not Indicated Intervention Not Indicated  [continues to deny food insecurity]  Housing Interventions Intervention Not Indicated Intervention Not Indicated Intervention Not Indicated -- Intervention Not Indicated --  Transportation Interventions Intervention Not Indicated  [daughter provides transportation] Inpatient TOC, Intervention Not Indicated, Patient Resources (Friends/Family) Intervention Not Indicated Intervention Not Indicated  [normally drives self,  daughter assists as needed/ indicated] Intervention Not Indicated Intervention Not Indicated  [patient continues to drive self]  Utilities Interventions Intervention Not Indicated -- Intervention Not Indicated -- Intervention Not Indicated --  Alcohol Usage Interventions -- Intervention Not Indicated (Score <7) Intervention Not Indicated (Score <7) -- Intervention Not Indicated (Score <7) --  Financial Strain Interventions -- Intervention Not Indicated Intervention Not Indicated -- Intervention Not Indicated --  Physical Activity Interventions -- -- Patient Declined -- Intervention Not Indicated --  Stress Interventions -- -- Intervention Not Indicated -- Intervention Not Indicated --  Social Connections Interventions -- -- Intervention Not Indicated -- Intervention Not Indicated --  Health Literacy Interventions -- -- Intervention Not Indicated -- -- --       Goals Addressed             This Visit's Progress    TOC 30-day Program Care Plan   On track    Current Barriers:  Home Health services spouse reports has heard from home health agency: they are to call "today  or tomorrow" to schedule initial visit "later this week" Baseline chronic sacral decubitus ulcer: spouse reports, "it's  not getting any better;" states this area causes him discomfort and states "Dr. Lawerance [name] knows about this" Resides with supportive spouse: both he and spouse are very HOH: spouse's phone number is connected to her hearing aid: request all calls be placed to spouse at [phone] Local supportive daughter Todd [name] assists with all care needs as indicated and provides transportation to provider office visits New to home O2: spouse reports on 10/29/23: "we had a problem yesterday with the machine; we are waiting on the company to come out today to address the issue; he is not using the oxygen all the time- only when he gets winded"  Verified home O2 provided by [employer] at time of hospital discharge 2 unplanned hospital admissions x last 12 months  RNCM Clinical Goal(s):  Patient will work with the Care Management team over the next 30 days to address Transition of Care Barriers: Provider appointments Home Health services through collaboration with RN Care manager, provider, and care team.   Interventions: Evaluation of current treatment plan related to  self management and patient's adherence to plan as established by provider  Transitions of Care:  Goal on track:  Yes. 11/06/23 Durable Medical Equipment (DME) needs assessed with patient/caregiver Doctor Visits  - discussed the importance of doctor visits Post discharge activity limitations prescribed by provider reviewed Discussed current clinical condition with caregivers: "He is better; still using the home oxygen mainly just when he has periods of high activity like taking a shower; he didn't even have to use it yesterday when we took him to the doctor office.  He has a cardioversion scheduled next week.  His sugars have been running low every morning but since dr. Lawerance [name] adjusted the long acting insulin, that is getting better now" Reviewed recent provider office visit on 11/01/23- CHF clinic; 11/02/23- PCP; 11/05/23- [facility]: spouse and daughter Todd [name]  verbalize excellent understanding of all post-office visit instructions Confirmed has started newly prescribed spironolactone as per CHF provider instructions Confirmed PCP examined sacral wound/ provided guidance around treatment at home; daughter reports, "Dr. Lawerance [name] thought it looked better and so do I;" she verbalizes excellent understanding of caring for wound at home-- positive reinforcement provided with encouragement to continue efforts Confirmed attended [facility] provider office visit-- has scheduled outpatient cardioversion on Monday 10/15/23 Confirmed home health has contacted patient and have plans to initiate services "soon;" they were planning to complete initial home visit on yesterday- but patient has A-Fib provider office visit; reinforced role of home health services with importance of participation/ ongoing engagement Confirmed uses assistive devices on regular basis, at baseline -- walker; provided education/ reinforcement around fall prevention  Provided education around benefit of conservative post-hospital discharge activity; need to pace activity without over-doing  Confirms continuing to use home O2 as per hospital discharge instructions: spouse confirms [employer] replaced home O2 concentrator after it stopped working correctly post-hospital discharge-- they have had no additional concerns now that concentrator has been replaced Reinforced previously provided education around basic safe use of home O2  Reviewed recent daily weights at home: reported as "all between 133-135; today 133.4 lbs;"  reinforced rationale for daily weight monitoring at home along with weight gain guidelines/ action plan for weight gain; importance of taking diuretic as prescribed: spouse and daughter verbalize excellent understanding of same Confirmed patient continues monitoring blood sugars at home - using FSL CGM: spouse again reports, "his blood  sugars are always high because he is on the prednisone" but  she adds that since patient has been home from hospital: "his morning blood sugars are always low, between 50-60;" she states "they come up to 200-300 after he has had a meal;" She confirms they have reduced the dose of long-acting insulin to 15 U every day as per PCP instructions (he takes in morning) which "has helped"-- reports fasting blood sugars in morning since then "have been "in the 80's; it was 88 this morning;" patient remains on prednisone and remaining blood sugars throughout day after eating morning meal "shoot up to 200-300" TOC 30-day program initial assessment progression today  Patient Goals/Self-Care Activities: Participate in Transition of Care Program/Attend TOC scheduled calls Take all medications as prescribed Attend all scheduled provider appointments Call provider office for new concerns or questions  Continue pacing activity to avoid episodes of shortness of breath Continue using home oxygen as prescribed Continue to follow your established action plan for episodes of shortness of breath Use assistive devices as needed to prevent falls Continue monitoring and recording your blood sugars at home Continue working with the home health team that is involved in your care Keep up the good work taking care of the sacral sore (on his bottom) -- keep following the instructions provided to you by Dr. Lawerance [name]- I am glad it is looking better If you believe your condition is getting worse- contact your care providers (doctors) promptly- reaching out to your doctor early when you have concerns can prevent you from having to go to the hospital  Follow Up Plan:  Telephone follow up appointment with care management team member scheduled for:  Wednesday 11/14/23 at 3:00 pm         Plan: Telephone follow up appointment with care management team member scheduled for:   Wednesday 11/14/23 at 3:00 pm  Total time spent from review to signing of note/ including any care coordination  interventions:  64 minutes: both caregivers on phone: multiple provider office visits to review post-hospital discharge; clinical concerns around low morning fasting blood sugars  Pls call/ message for questions,  Caryl Pina, RN, BSN, CCRN Alumnus RN Care Manager  Transitions of Care  [facility] - [facility] [phone]: direct office

## 2023-11-06 NOTE — Patient Instructions (Signed)
 Visit Information  Thank you for taking time to visit with me today. Please don't hesitate to contact me if I can be of assistance to you before our next scheduled telephone appointment.  Our next appointment is by telephone on Wednesday 10/17/23 at 3:00 pm  Please call the care guide team at [phone] if you need to cancel or reschedule your appointment.   Following are the goals we discussed today:  Patient Goals/Self-Care Activities: Participate in Transition of Care Program/Attend TOC scheduled calls Take all medications as prescribed Attend all scheduled provider appointments Call provider office for new concerns or questions  Continue pacing activity to avoid episodes of shortness of breath Continue using home oxygen as prescribed Continue to follow your established action plan for episodes of shortness of breath Use assistive devices as needed to prevent falls Continue monitoring and recording your blood sugars at home Continue working with the home health team that is involved in your care Keep up the good work taking care of the sacral sore (on his bottom) -- keep following the instructions provided to you by Dr. Lawerance Bach- I am glad it is looking better If you believe your condition is getting worse- contact your care providers (doctors) promptly- reaching out to your doctor early when you have concerns can prevent you from having to go to the hospital  If you are experiencing a Mental Health or Behavioral Health Crisis or need someone to talk to, please call the Suicide and Crisis Lifeline: 988 call the [place] National Suicide Prevention Lifeline: 360 455 3726 or TTY: (820)789-6271 TTY 279-591-7914) to talk to a trained counselor call 1-800-273-TALK (toll free, 24 hour hotline) go to [facility] [address] [place] [phone]) call the [facility] Crisis Line: [phone] call 911   Patient's caregivers verbalizes  understanding of instructions and care plan provided today and agrees to view in MyChart. Active MyChart status and patient understanding of how to access instructions and care plan via MyChart confirmed with patient.     Pls call/ message for questions,  Caryl Pina, RN, BSN, CCRN Alumnus RN Care Manager  Transitions of Care  [facility] - [facility] [phone]: direct office

## 2023-11-07 ENCOUNTER — Other Ambulatory Visit: Payer: Self-pay

## 2023-11-07 ENCOUNTER — Telehealth (HOSPITAL_COMMUNITY): Payer: Self-pay | Admitting: Cardiology

## 2023-11-07 MED ORDER — DIGOXIN 125 MCG PO TABS: 0.1250 mg | ORAL_TABLET | Freq: Every day | ORAL | 3 refills | Status: DC

## 2023-11-07 NOTE — Telephone Encounter (Signed)
-----   Message from Alen Bleacher sent at 11/07/2023  9:45 AM EDT ----- Please send digoxin refills. Thanks ----- Message ----- From: Michaela Corner, RN Sent: 11/06/2023   5:07 PM EDT To: Alen Bleacher, NP  Hi Alma,  You saw patient on 11/01/23 and recommended he continue taking the digoxin as per hospital discharging provider instructions, from his recent hospitalization  Spouse contacted me this afternoon---- she reports that patient will not have enough digoxin to last him until he has next scheduled office visit with CHF provider on 11/15/23: hospital discharging provider did not prescribe enough to last, and he will run out early into next week   If you would like patient to continue taking--- please sent new Rx in to their outpatient pharmacy so he will not run out-- there are no refills on the current Rx from the hospital discharge  Pls call/ message for questions,  Caryl Pina, RN, BSN, CCRN Alumnus RN Care Manager  Transitions of Care  [facility] - [facility] [phone]: direct office

## 2023-11-09 NOTE — Progress Notes (Signed)
 Called patient with pre-procedure instructions for Monday March 24th. Talked with patient's daughter, Todd [name], who will be driving patient to procedure.   Patient informed of:   Time to arrive for procedure. 0900 Remain NPO past midnight.  Must have a ride home and a responsible adult to remain with them for 24 hours post procedure.  Confirmed blood thinner. Xarelto  Confirmed no breaks in taking blood thinner for 3+ weeks prior to procedure. Confirmed patient stopped all GLP-1s and GLP-2s for at least one week before procedure. Not taking Marcelline Deist yet

## 2023-11-12 ENCOUNTER — Ambulatory Visit (HOSPITAL_COMMUNITY)
Admission: RE | Admit: 2023-11-12 | Discharge: 2023-11-12 | Disposition: A | Discharge status: Home / Self Care | Source: Ambulatory Visit | Attending: Cardiovascular Disease | Admitting: Cardiovascular Disease

## 2023-11-12 ENCOUNTER — Other Ambulatory Visit: Payer: Self-pay

## 2023-11-12 ENCOUNTER — Ambulatory Visit (HOSPITAL_COMMUNITY): Admitting: Anesthesiology

## 2023-11-12 ENCOUNTER — Encounter (HOSPITAL_COMMUNITY): Payer: Self-pay | Admitting: Cardiovascular Disease

## 2023-11-12 ENCOUNTER — Encounter (HOSPITAL_COMMUNITY)
Admission: RE | Disposition: A | Discharge status: Home / Self Care | Payer: Self-pay | Source: Ambulatory Visit | Attending: Cardiovascular Disease

## 2023-11-12 ENCOUNTER — Telehealth: Payer: Self-pay | Admitting: Internal Medicine

## 2023-11-12 DIAGNOSIS — Z951 Presence of aortocoronary bypass graft: Secondary | ICD-10-CM | POA: Diagnosis not present

## 2023-11-12 DIAGNOSIS — D6869 Other thrombophilia: Secondary | ICD-10-CM | POA: Diagnosis not present

## 2023-11-12 DIAGNOSIS — Z794 Long term (current) use of insulin: Secondary | ICD-10-CM | POA: Diagnosis not present

## 2023-11-12 DIAGNOSIS — K219 Gastro-esophageal reflux disease without esophagitis: Secondary | ICD-10-CM | POA: Diagnosis not present

## 2023-11-12 DIAGNOSIS — Z7984 Long term (current) use of oral hypoglycemic drugs: Secondary | ICD-10-CM | POA: Insufficient documentation

## 2023-11-12 DIAGNOSIS — I252 Old myocardial infarction: Secondary | ICD-10-CM | POA: Insufficient documentation

## 2023-11-12 DIAGNOSIS — Z7952 Long term (current) use of systemic steroids: Secondary | ICD-10-CM | POA: Insufficient documentation

## 2023-11-12 DIAGNOSIS — I110 Hypertensive heart disease with heart failure: Secondary | ICD-10-CM | POA: Diagnosis not present

## 2023-11-12 DIAGNOSIS — I4819 Other persistent atrial fibrillation: Secondary | ICD-10-CM | POA: Insufficient documentation

## 2023-11-12 DIAGNOSIS — Z79899 Other long term (current) drug therapy: Secondary | ICD-10-CM | POA: Diagnosis not present

## 2023-11-12 DIAGNOSIS — Z8616 Personal history of COVID-19: Secondary | ICD-10-CM | POA: Diagnosis not present

## 2023-11-12 DIAGNOSIS — I2510 Atherosclerotic heart disease of native coronary artery without angina pectoris: Secondary | ICD-10-CM

## 2023-11-12 DIAGNOSIS — Z7901 Long term (current) use of anticoagulants: Secondary | ICD-10-CM | POA: Diagnosis not present

## 2023-11-12 DIAGNOSIS — I4891 Unspecified atrial fibrillation: Secondary | ICD-10-CM | POA: Diagnosis not present

## 2023-11-12 DIAGNOSIS — E785 Hyperlipidemia, unspecified: Secondary | ICD-10-CM | POA: Diagnosis not present

## 2023-11-12 DIAGNOSIS — J8410 Pulmonary fibrosis, unspecified: Secondary | ICD-10-CM | POA: Insufficient documentation

## 2023-11-12 DIAGNOSIS — I5022 Chronic systolic (congestive) heart failure: Secondary | ICD-10-CM | POA: Diagnosis not present

## 2023-11-12 DIAGNOSIS — I10 Essential (primary) hypertension: Secondary | ICD-10-CM | POA: Diagnosis not present

## 2023-11-12 DIAGNOSIS — E119 Type 2 diabetes mellitus without complications: Secondary | ICD-10-CM | POA: Diagnosis not present

## 2023-11-12 HISTORY — PX: CARDIOVERSION: EP1203

## 2023-11-12 LAB — GLUCOSE, CAPILLARY: Glucose-Capillary: 106 mg/dL — ABNORMAL HIGH (ref 70–99)

## 2023-11-12 SURGERY — CARDIOVERSION (CATH LAB)
Anesthesia: General

## 2023-11-12 MED ORDER — SODIUM CHLORIDE 0.9% FLUSH: 3.0000 mL | INTRAVENOUS | Status: DC | PRN

## 2023-11-12 MED ORDER — SODIUM CHLORIDE 0.9% FLUSH: 3.0000 mL | Freq: Two times a day (BID) | INTRAVENOUS | Status: DC

## 2023-11-12 MED ORDER — PROPOFOL 10 MG/ML IV BOLUS
INTRAVENOUS | Status: DC | PRN
  Administered 2023-11-12: 50 mg via INTRAVENOUS

## 2023-11-12 MED ORDER — LIDOCAINE 2% (20 MG/ML) 5 ML SYRINGE
INTRAMUSCULAR | Status: DC | PRN
  Administered 2023-11-12: 50 mg via INTRAVENOUS

## 2023-11-12 SURGICAL SUPPLY — 1 items: PAD DEFIB RADIO PHYSIO CONN (PAD) ×1 IMPLANT

## 2023-11-12 NOTE — Anesthesia Preprocedure Evaluation (Addendum)
 Anesthesia Evaluation  Patient identified by MRN, date of birth, ID band Patient awake    Reviewed: Allergy & Precautions, NPO status , Patient's Chart, lab work & pertinent test results, reviewed documented beta blocker date and time   History of Anesthesia Complications Negative for: history of anesthetic complications  Airway Mallampati: II  TM Distance: >3 FB Neck ROM: Full    Dental no notable dental hx.    Pulmonary asthma    Pulmonary exam normal        Cardiovascular hypertension, Pt. on home beta blockers and Pt. on medications + CAD, + Past MI, + CABG (1999) and + Peripheral Vascular Disease  Normal cardiovascular exam+ dysrhythmias Atrial Fibrillation   TTE 10/16/23: 1. Left ventricular ejection fraction, by estimation, is 45%. The left  ventricle has mildly decreased function. The left ventricle demonstrates  global hypokinesis. There is mild concentric left ventricular hypertrophy.  Left ventricular diastolic  parameters are indeterminate.   2. Right ventricular systolic function is mildly reduced. The right  ventricular size is mildly enlarged. There is normal pulmonary artery  systolic pressure. The estimated right ventricular systolic pressure is  33.3 mmHg.   3. Left atrial size was mildly dilated.   4. The mitral valve is degenerative. Mild mitral valve regurgitation. No  evidence of mitral stenosis. Moderate mitral annular calcification.   5. The aortic valve is tricuspid. There is moderate calcification of the  aortic valve. Aortic valve regurgitation is not visualized. Aortic valve  sclerosis/calcification is present, without any evidence of aortic  stenosis.   6. The inferior vena cava is dilated in size with <50% respiratory  variability, suggesting right atrial pressure of 15 mmHg.   7. The patient was in atrial fibrillation.     Neuro/Psych CVA  negative psych ROS   GI/Hepatic Neg liver ROS,GERD   ,,  Endo/Other  diabetes, Type 2, Insulin Dependent    Renal/GU negative Renal ROS  negative genitourinary   Musculoskeletal  (+) Arthritis ,    Abdominal   Peds  Hematology negative hematology ROS (+)   Anesthesia Other Findings Day of surgery medications reviewed with patient.  Reproductive/Obstetrics                             Anesthesia Physical Anesthesia Plan  ASA: 3  Anesthesia Plan: General   Post-op Pain Management: Minimal or no pain anticipated   Induction: Intravenous  PONV Risk Score and Plan: Treatment may vary due to age or medical condition and Propofol infusion  Airway Management Planned: Mask  Additional Equipment: None  Intra-op Plan:   Post-operative Plan:   Informed Consent: I have reviewed the patients History and Physical, chart, labs and discussed the procedure including the risks, benefits and alternatives for the proposed anesthesia with the patient or authorized representative who has indicated his/her understanding and acceptance.       Plan Discussed with: CRNA  Anesthesia Plan Comments:         Anesthesia Quick Evaluation

## 2023-11-12 NOTE — Telephone Encounter (Signed)
 Okay for orders?

## 2023-11-12 NOTE — Transfer of Care (Signed)
 Immediate Anesthesia Transfer of Care Note  Patient: Todd [name]  Procedure(s) Performed: CARDIOVERSION  Patient Location: PACU  Anesthesia Type:MAC  Level of Consciousness: sedated  Airway & Oxygen Therapy: Patient Spontanous Breathing  Post-op Assessment: Report given to RN  Post vital signs: Reviewed and stable  Last Vitals:  Vitals Value Taken Time  BP 126/88 11/12/23 0943  Temp    Pulse 93 11/12/23 0943  Resp 18 11/12/23 0943  SpO2 93 % 11/12/23 0943  Vitals shown include unfiled device data.  Last Pain:  Vitals:   11/12/23 0938  TempSrc:   PainSc: 0-No pain         Complications: No notable events documented.

## 2023-11-12 NOTE — Progress Notes (Incomplete)
 HEART & VASCULAR TRANSITION OF CARE PROGRESS NOTE    Referring Physician: [name], MD  EP cardiologist: Dr. Tamela Oddi cardiologist: Dr. Antoine Poche  Chief Complaint: follow up for chronic systolic heart failure   HPI: Referred to clinic by Dr. Jerral Ralph for heart failure consultation.   Todd [name] is a 83 y.o. male with CAD s/p CABG x4, HFmrEF, DM II, HTN, HLD, PAF and pulmonary fibrosis.   Admitted 5/24 with a fib RVR and elevated troponin that peaked at 5977. Echo 5/24 showed EF 55-60%, no RWMA and mild LVH. LHC showed patent LIMA to LAD, SVG to RCA, and SVG to OM2 with 65% stenosis of the proximal SVG. Elevated trops thought to be demand ischemia 2/2 a fib RVR. Plan was for medical therapy, with consideration of PCI to SVG to OM2 if he had refractory angina. Converted back to NSR with IV diltiazem and was transitioned to PO metoprolol.   Zio placed 6/24: showed 10% a fib/flutter burden.   Was noted to have increased swelling in BLE 11/24, suspected 2/2 high sodium intake. He was started on PO lasix. He was referred to EP.   He was seen by EP 2/25, decided to schedule LAA appendage closure at a later date.   Admitted 2/25 with SOB, Covid, A fib w/ RVR and weakness. He had a choking episode in the ED, was eventually evaluated by SLP and cleared for a diet. He was seen by cardiology who started him on rate control therapies with plans for OP DCCV once recovered from covid. He was also noted to be volume overloaded. Echo showed EF newly reduced to 45% (previously 55-60%), suspected EF reduction 2/2 a fib. Only on metoprolol (GDMT) at discharge, GDMT was limited with soft BP. Unfortunately b/c he was admitted, he missed his appt for further w/u/ CT  for Watchman  He was discharged home.   Underwent outpatient DCCV earlier this wk on 11/12/23. Converted to NSR after 1 shock.   He presents back for his 2nd TOC visit. Here w/ family, wife and daughter. Doing well. Feels much better.  EKG today shows NSR. HR 69 bpm. Compliant w/ medications. BP ok but reports some occasional positional dizziness. Has occasional bruising but no other abnormal bleeding. Wt down 4 lb since hospital d/c. He was instructed by PCP to restart [name] today. This had been held due to recent hypoglycemia but other diabetes meds have been adjusted and glucose levels have now been stable.   We discussed Watchman. He wants to hold off for now.    Past Medical History:  Diagnosis Date   Allergic rhinitis    Asthma    Atrial fibrillation with rapid ventricular response (HCC)    a. newly diagnosed 11/03/13, spont conv to NSR, placed on xarelto.   CAD (coronary artery disease)    a. 1999; s/p CABG: LIMA to LAD, SVG to OM1, SVG to OM2, SVG to RCA  b. Normal nuc 10/2013 (done because of new onset AF.)   COVID    Diabetes mellitus (HCC)    HLD (hyperlipidemia)    HTN (hypertension)    Nephrolithiasis    Overweight(278.02)    PVD (peripheral vascular disease) (HCC)    Carotid stenosis   Stroke [facility])    Current Outpatient Medications  Medication Sig Dispense Refill   acetaminophen (TYLENOL) 650 MG CR tablet Take 650 mg by mouth every 8 (eight) hours as needed for pain.     atorvastatin (LIPITOR) 80 MG tablet  Take 1 tablet (80 mg total) by mouth daily. (Patient taking differently: Take 80 mg by mouth at bedtime.) 90 tablet 3   cholecalciferol (VITAMIN D3) 25 MCG (1000 UNIT) tablet Take 1,000 Units by mouth daily.     Continuous Glucose Receiver (FREESTYLE LIBRE 3 READER) DEVI 1 each by Does not apply route 4 (four) times daily -  before meals and at bedtime. 1 each 0   Continuous Glucose Sensor (DEXCOM G7 SENSOR) MISC UAD to monitor sugars  E11.51 9 each 3   Continuous Glucose Sensor (FREESTYLE LIBRE 3 SENSOR) MISC 1 each by Does not apply route 4 (four) times daily -  before meals and at bedtime. Place 1 sensor on the skin every 14 days. Use to check glucose continuously 2 each 0   cyanocobalamin (VITAMIN  B12) 1000 MCG tablet Take 1,000 mcg by mouth daily.     dapagliflozin propanediol ([name]) 10 MG TABS tablet Take 1 tablet (10 mg total) by mouth daily before breakfast.     digoxin (LANOXIN) 0.125 MG tablet Take 1 tablet (0.125 mg total) by mouth daily. 30 tablet 3   furosemide (LASIX) 40 MG tablet Take 1 tablet (40 mg total) by mouth daily.     insulin aspart (NOVOLOG) 100 UNIT/ML FlexPen Inject 4 Units into the skin 3 (three) times daily with meals. Only take if eating a meal AND Blood Glucose (BG) is 80 or higher. (Patient taking differently: Inject 4 Units into the skin 3 (three) times daily as needed for high blood sugar (Only take if eating a meal AND Blood Glucose (BG) is 80 or higher.).) 15 mL 0   Insulin Pen Needle (PEN NEEDLES) 31G X 5 MM MISC 1 each by Does not apply route 3 (three) times daily. May dispense any manufacturer covered by patient's insurance. 100 each 0   Insulin Pen Needle (PEN NEEDLES) 32G X 5 MM MISC UAD for daily lantus injection 90 each 3   ipratropium (ATROVENT) 0.06 % nasal spray Place 2-4 sprays into both nostrils every 8 (eight) hours as needed for rhinitis.     metFORMIN (GLUCOPHAGE-XR) 500 MG 24 hr tablet Take 1 tablet (500 mg total) by mouth 2 (two) times daily with a meal. 60 tablet 5   metoprolol tartrate (LOPRESSOR) 100 MG tablet TAKE 1 TABLET BY MOUTH 2 TIMES DAILY. 180 tablet 3   Multiple Vitamin (MULTIVITAMIN PO) Take 1 tablet by mouth daily.     Nintedanib (OFEV) 100 MG CAPS Take 1 capsule (100 mg total) by mouth 2 (two) times daily. 180 capsule 1   nitroGLYCERIN (NITROSTAT) 0.4 MG SL tablet Place 1 tablet (0.4 mg total) under the tongue every 5 (five) minutes as needed for chest pain. 100 tablet 3   Omega-3 Fatty Acids (FISH OIL PO) Take 1,030 mg by mouth 2 (two) times daily.     omeprazole (PRILOSEC) 40 MG capsule TAKE 1 CAPSULE BY MOUTH 2 TIMES DAILY. 120 capsule 5   ONETOUCH DELICA LANCETS 33G MISC TEST BLOOD SUGAR ONCE DAILY 100 each 3   ONETOUCH  VERIO test strip USE UP TO 4 TIMES A DAY AS DIRECTED 100 strip 0   PIP PEN NEEDLES 31G X 31G X 5 MM MISC USE TO INJECT INSULIN 3 TIMES DAILY. MAY DISPENSE ANY MANUFACTURER COVERED BY PATIENT''S INSURANCE.     predniSONE (DELTASONE) 10 MG tablet TAKE 1 TABLET (10 MG TOTAL) BY MOUTH DAILY WITH BREAKFAST. 30 tablet 5   rivaroxaban (XARELTO) 20 MG TABS tablet TAKE  1 TABLET (20 MG TOTAL) BY MOUTH DAILY WITH SUPPER. 30 tablet 5   spironolactone (ALDACTONE) 25 MG tablet Take 0.5 tablets (12.5 mg total) by mouth daily. 45 tablet 3   No current facility-administered medications for this encounter.   Allergies  Allergen Reactions   Amlodipine Besylate     Rash Because of a history of documented adverse serious drug reaction;Medi Alert bracelet  is recommended   Iodinated Contrast Media Rash    Rash  Because of a history of documented adverse serious drug reaction;Medi Alert bracelet  is recommended  Rash, Because of a history of documented adverse serious drug reaction;Medi Alert bracelet  is recommended   Metformin And Related Diarrhea    Take a lower dose   Social History   Socioeconomic History   Marital status: Married    Spouse name: Darel Hong   Number of children: 2   Years of education: Not on file   Highest education level: High school graduate  Occupational History   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  Tobacco Use   Smoking status: Never   Smokeless tobacco: Never  Vaping Use   Vaping status: Never Used  Substance and Sexual Activity   Alcohol use: No    Alcohol/week: 0.0 standard drinks of alcohol   Drug use: No   Sexual activity: Yes  Other Topics Concern   Not on file  Social History Narrative   Right handed    Social Drivers of Health   Financial Resource Strain: Low Risk  (10/23/2023)   Overall Financial Resource Strain (CARDIA)    Difficulty of Paying Living Expenses: Not very hard  Food Insecurity: No Food Insecurity (10/29/2023)    Hunger Vital Sign    Worried About Running Out of Food in the Last Year: Never true    Ran Out of Food in the Last Year: Never true  Transportation Needs: No Transportation Needs (10/29/2023)   PRAPARE - Administrator, Civil Service (Medical): No    Lack of Transportation (Non-Medical): No  Physical Activity: Inactive (08/03/2023)   Exercise Vital Sign    Days of Exercise per Week: 0 days    Minutes of Exercise per Session: 0 min  Stress: No Stress Concern Present (08/03/2023)   [employer] of Occupational Health - Occupational Stress Questionnaire    Feeling of Stress : Not at all  Social Connections: Moderately Integrated (10/19/2023)   Social Connection and Isolation Panel [NHANES]    Frequency of Communication with Friends and Family: More than three times a week    Frequency of Social Gatherings with Friends and Family: Twice a week    Attends Religious Services: More than 4 times per year    Active Member of [employer] or Organizations: No    Attends Banker Meetings: Never    Marital Status: Married  Catering manager Violence: Patient Unable To Answer (10/29/2023)   Humiliation, Afraid, Rape, and Kick questionnaire    Fear of Current or Ex-Partner: Patient unable to answer    Emotionally Abused: Patient unable to answer    Physically Abused: Patient unable to answer    Sexually Abused: Patient unable to answer   Family History  Problem Relation Age of Onset   Heart attack Mother 67   Diabetes Father        IDDM   Stroke Father 53   Asthma Sister    Lupus Sister    Fibromyalgia Sister  Fibromyalgia Sister    Prostate cancer Brother    Hemochromatosis Brother    Colon cancer Brother 33   Heart attack Brother 33   Heart attack Brother 40   Esophageal cancer Neg Hx    Rectal cancer Neg Hx    Stomach cancer Neg Hx    Vitals:   11/15/23 1059  BP: 136/77  Pulse: 68  SpO2: 95%  Weight: 61.1 kg (134 lb 9.6 oz)  Height: 5\' 6"  (1.676 m)     PHYSICAL EXAM: General:  Well appearing, elderly . No respiratory difficulty HEENT: normal Neck: supple. no JVD. Carotids 2+ bilat; no bruits. No lymphadenopathy or thyromegaly appreciated. Cor: PMI nondisplaced. Regular rate & rhythm. No rubs, gallops or murmurs. Lungs: faint basilar crackles at the bases (pulmonary fibrosis)  Abdomen: soft, nontender, nondistended. No hepatosplenomegaly. No bruits or masses. Good bowel sounds. Extremities: no cyanosis, clubbing, rash, edema Neuro: alert & oriented x 3, cranial nerves grossly intact. moves all 4 extremities w/o difficulty. Affect pleasant.   Wt Readings from Last 3 Encounters:  11/15/23 61.1 kg (134 lb 9.6 oz)  11/05/23 62.5 kg (137 lb 12.8 oz)  11/02/23 63 kg (139 lb)    ECG: NSR 69 bpm (Personally reviewed)    ASSESSMENT & PLAN: Chronic Systolic Heart Failure  - Echo 5/24 showed EF 55-60%, no RWMA and mild LVH - Echo 2/25 EF 45%, mild concentric LVH, RV mildly reduced, LA mildly dilated, mild MR - Suspect recent drop in EF may be tachy-mediated from a fib RVR or recent covid.  - Volume stable on exam. Euvolemic  - NYHA Class II-IIIa, confounded by age and known pulmonary fibrosis - continue [name] 10 mg daily  - continue spironolactone 12.5 mg daily. No dose titration nor addition of ARB yet given positional dizziness   - continue metoprolol 100 mg bid - continue digoxin 0.125 mg daily, check dig level  - continue Lasix 40 mg daily - check BMP today    CAD s/p CABG  - s/p CABG x4 99': LIMA to LAD, SVG to OM1, SVG to OM2, SVG to RCA  - LHC 5/24 showed patent LIMA to LAD, SVG to RCA, and SVG to OM2 with 65% stenosis of the proximal SVG. - stable w/o CP  - Continue statin - Continue ? blocker - no ASA given Xarelto use   Atrial Fibrillation  - s/p successful DCCV 11/12/23, maintaining NSR on EKG today. HR well controlled - continue metoprolol and digoxin  - no amiodarone given pulmonary fibrosis  - Continue Xarelto.  He wants to hold off on Watchman for now   HTN - controlled on current regimen   Pulmonary fibrosis - Followed at [facility] - Followed by Dr. Isaiah Serge.    Referred to HFSW (PCP, Medications, Transportation, ETOH Abuse, Drug Abuse, Insurance, Financial ):  No Refer to Pharmacy:  No Refer to Home Health: No Refer to Advanced Heart Failure Clinic: No  Refer to General Cardiology: Continue to follow with Dr. Antoine Poche  Keep f/u w/ gen cards. Scheduled on 5/1.  Robbie Lis, PA-C 11/15/2023

## 2023-11-12 NOTE — Telephone Encounter (Unsigned)
 Copied from CRM [id number]. Topic: Clinical - Home Health Verbal Orders >> Nov 12, 2023 12:50 PM Fredrich Romans wrote: Caller/Agency: [facility] Callback Number: [phone] Service Requested: Occupational Therapy Frequency: 1wk4 Any new concerns about the patient? No

## 2023-11-12 NOTE — Interval H&P Note (Signed)
 History and Physical Interval Note:  11/12/2023 9:08 AM  Todd [name]  has presented today for surgery, with the diagnosis of AFIB.  The various methods of treatment have been discussed with the patient and family. After consideration of risks, benefits and other options for treatment, the patient has consented to  Procedure(s): CARDIOVERSION (N/A) as a surgical intervention.  The patient's history has been reviewed, patient examined, no change in status, stable for surgery.  I have reviewed the patient's chart and labs.  Questions were answered to the patient's satisfaction.     Sari Cogan

## 2023-11-12 NOTE — Op Note (Signed)
 Procedure: Electrical Cardioversion Indications:  Atrial Fibrillation  Procedure Details:  Consent: Risks of procedure as well as the alternatives and risks of each were explained to the (patient/caregiver).  Consent for procedure obtained.  Time Out: Verified patient identification, verified procedure, site/side was marked, verified correct patient position, special equipment/implants available, medications/allergies/relevent history reviewed, required imaging and test results available.  Performed  Patient placed on cardiac monitor, pulse oximetry, supplemental oxygen as necessary.  Sedation given:  propofol IV, Dr. Stephannie Peters Pacer pads placed anterior and posterior chest.  Cardioverted 1 time(s).  Cardioversion with synchronized biphasic 200J shock.  Evaluation: Findings: Post procedure EKG shows: NSR Complications: None Patient did tolerate procedure well.  Time Spent Directly with the Patient:  30 minutes   Todd [name] 11/12/2023, 9:55 AM

## 2023-11-12 NOTE — Anesthesia Postprocedure Evaluation (Signed)
 Anesthesia Post Note  Patient: Todd [name]  Procedure(s) Performed: CARDIOVERSION     Patient location during evaluation: PACU Anesthesia Type: General Level of consciousness: awake and alert Pain management: pain level controlled Vital Signs Assessment: post-procedure vital signs reviewed and stable Respiratory status: spontaneous breathing, nonlabored ventilation and respiratory function stable Cardiovascular status: blood pressure returned to baseline Postop Assessment: no apparent nausea or vomiting Anesthetic complications: no   No notable events documented.  Last Vitals:  Vitals:   11/12/23 0916  BP: (!) 152/79  Pulse: 93  Resp: 10  Temp: 36.7 C  SpO2: 93%    Last Pain:  Vitals:   11/12/23 0938  TempSrc:   PainSc: 0-No pain                 Shanda Howells

## 2023-11-13 ENCOUNTER — Ambulatory Visit
Admission: RE | Admit: 2023-11-13 | Discharge: 2023-11-13 | Disposition: A | Discharge status: Home / Self Care | Payer: PPO | Source: Ambulatory Visit | Attending: Neurology | Admitting: Neurology

## 2023-11-13 DIAGNOSIS — R29818 Other symptoms and signs involving the nervous system: Secondary | ICD-10-CM | POA: Diagnosis not present

## 2023-11-13 DIAGNOSIS — R29898 Other symptoms and signs involving the musculoskeletal system: Secondary | ICD-10-CM

## 2023-11-13 DIAGNOSIS — R270 Ataxia, unspecified: Secondary | ICD-10-CM

## 2023-11-13 DIAGNOSIS — M47816 Spondylosis without myelopathy or radiculopathy, lumbar region: Secondary | ICD-10-CM | POA: Diagnosis not present

## 2023-11-13 DIAGNOSIS — M542 Cervicalgia: Secondary | ICD-10-CM

## 2023-11-13 DIAGNOSIS — S32030S Wedge compression fracture of third lumbar vertebra, sequela: Secondary | ICD-10-CM

## 2023-11-13 DIAGNOSIS — M545 Low back pain, unspecified: Secondary | ICD-10-CM

## 2023-11-13 DIAGNOSIS — M47812 Spondylosis without myelopathy or radiculopathy, cervical region: Secondary | ICD-10-CM | POA: Diagnosis not present

## 2023-11-13 DIAGNOSIS — M48061 Spinal stenosis, lumbar region without neurogenic claudication: Secondary | ICD-10-CM | POA: Diagnosis not present

## 2023-11-14 ENCOUNTER — Other Ambulatory Visit: Payer: Self-pay | Admitting: *Deleted

## 2023-11-14 ENCOUNTER — Other Ambulatory Visit: Payer: Self-pay | Admitting: Internal Medicine

## 2023-11-14 ENCOUNTER — Telehealth (HOSPITAL_COMMUNITY): Payer: Self-pay

## 2023-11-14 NOTE — Telephone Encounter (Signed)
 Called to confirm/remind patient of their appointment at the [facility] Clinic on 327/025 11:00.   Appointment:   [] Confirmed  [x] Left mess   [] No answer/No voice mail  [] Phone not in service  Patient reminded to bring all medications and/or complete list.  Confirmed patient has transportation. Gave directions, instructed to utilize valet parking.

## 2023-11-14 NOTE — Patient Instructions (Signed)
 Visit Information  Thank you for taking time to visit with me today. Please don't hesitate to contact me if I can be of assistance to you before our next scheduled telephone appointment.  Our next appointment is by telephone on Tuesday, 11/20/23 at 2:00 pm  Please call the care guide team at [phone] if you need to cancel or reschedule your appointment.   Following are the goals we discussed today:  Patient Goals/Self-Care Activities: Participate in Transition of Care Program/Attend TOC scheduled calls Take all medications as prescribed Attend all scheduled provider appointments Call provider office for new concerns or questions  Continue pacing activity to avoid episodes of shortness of breath Continue using home oxygen as prescribed Continue to follow your established action plan for episodes of shortness of breath Use assistive devices as needed to prevent falls Continue monitoring and recording your blood sugars at home Continue working with the home health team that is involved in your care Keep up the good work taking care of the sacral sore (on his bottom) -- keep following the instructions provided to you by Dr. Lawerance Bach- I am glad it is looking better If you believe your condition is getting worse- contact your care providers (doctors) promptly- reaching out to your doctor early when you have concerns can prevent you from having to go to the hospital  If you are experiencing a Mental Health or Behavioral Health Crisis or need someone to talk to, please  call the Suicide and Crisis Lifeline: 988 call the [place] National Suicide Prevention Lifeline: (817)107-0209 or TTY: 4708470638 TTY 4426306700) to talk to a trained counselor call 1-800-273-TALK (toll free, 24 hour hotline) go to [facility] [address], [place] [phone]) call the [facility] Crisis Line: (548)638-8950 call 911   Patient's caregiver verbalizes  understanding of instructions and care plan provided today and agrees to view in MyChart. Active MyChart status and patient understanding of how to access instructions and care plan via MyChart confirmed with patient.     Caryl Pina, RN, BSN, Media planner  Transitions of Care  [facility] - [facility] [phone]: direct office

## 2023-11-14 NOTE — Patient Outreach (Signed)
 Care Management  Transitions of Care Program Transitions of Care Post-discharge week 3/ day # 16   11/14/2023 Name: Todd [name] MRN: [id number] DOB: Mar 25, 1941  Subjective: Todd [name] is a 83 y.o. year old male who is a primary care patient of Burns, Todd Mo, MD. The Care Management team Engaged with patient's daughter/ caregiver Todd [name] by telephone to assess and address transitions of care needs.   Consent to Services:  Patient was given information about care management services, agreed to services, and gave verbal consent to participate.   Enrolled into TOC 30-day program:  10/29/23  Assessment: caregiver/ daughter Todd [name]:  "He is so much better after the cardioversion- it only took one shock and he converted to a normal rhythm- and he feels so much better.  He even wanted to go to church and out to eat afterward.  He didn't have to use his portable oxygen, but we had it with [place] in case he needed it.  We had the walker, but he didn't need that either.  He seems like his old self again, so we hope the cardioversion lasts for a long time.  His sugars have still been running on the low side every now and then, but we take care of that with orange juice and a snack- I am going to talk to Dr. Lawerance Bach about it today when I go for my own appointment"    Todd [name] denies clinical concerns around patient's condition today          SDOH Interventions    Flowsheet Row Telephone from 10/29/2023 in [place] POPULATION HEALTH DEPARTMENT ED to Hosp-Admission (Discharged) from 10/18/2023 in [place] 5W Medical Specialty PCU Clinical Support from 08/03/2023 in [facility] HealthCare at [place] Telephone from 01/11/2023 in [employer] [facility] Care Coordination Clinical Support from 08/03/2022 in [facility]Care at [facility] Chronic Care Management from 02/13/2022 in [facility] HealthCare at [place]  SDOH Interventions        Food Insecurity Interventions  Intervention Not Indicated -- Intervention Not Indicated Intervention Not Indicated Intervention Not Indicated Intervention Not Indicated  [continues to deny food insecurity]  Housing Interventions Intervention Not Indicated Intervention Not Indicated Intervention Not Indicated -- Intervention Not Indicated --  Transportation Interventions Intervention Not Indicated  [daughter provides transportation] Inpatient TOC, Intervention Not Indicated, Patient Resources (Friends/Family) Intervention Not Indicated Intervention Not Indicated  [normally drives self,  daughter assists as needed/ indicated] Intervention Not Indicated Intervention Not Indicated  [patient continues to drive self]  Utilities Interventions Intervention Not Indicated -- Intervention Not Indicated -- Intervention Not Indicated --  Alcohol Usage Interventions -- Intervention Not Indicated (Score <7) Intervention Not Indicated (Score <7) -- Intervention Not Indicated (Score <7) --  Financial Strain Interventions -- Intervention Not Indicated Intervention Not Indicated -- Intervention Not Indicated --  Physical Activity Interventions -- -- Patient Declined -- Intervention Not Indicated --  Stress Interventions -- -- Intervention Not Indicated -- Intervention Not Indicated --  Social Connections Interventions -- -- Intervention Not Indicated -- Intervention Not Indicated --  Health Literacy Interventions -- -- Intervention Not Indicated -- -- --        Goals Addressed             This Visit's Progress    TOC 30-day Program Care Plan   On track    Current Barriers:  Home Health services spouse reports has heard from home health agency: they are to call "today or  tomorrow" to schedule initial visit "later this week" Baseline chronic sacral decubitus ulcer: spouse reports, "it's not getting any better;" states this area causes him discomfort and states "Dr. Lawerance Bach knows about this" Resides with supportive spouse: both he and spouse are  very HOH: spouse's phone number is connected to her hearing aid: request all calls be placed to spouse at [phone] Local supportive daughter Todd [name] assists with all care needs as indicated and provides transportation to provider office visits New to home O2: spouse reports on 10/29/23: "we had a problem yesterday with the machine; we are waiting on the company to come out today to address the issue; he is not using the oxygen all the time- only when he gets winded"  Verified home O2 provided by [employer] at time of hospital discharge 11/06/23- verified home O2 issue corrected on 10/29/23- [employer] came to home and resolved issue/ replaced concentrator  2 unplanned hospital admissions x last 12 months  RNCM Clinical Goal(s):  Patient will work with the Care Management team over the next 30 days to address Transition of Care Barriers: Provider appointments Home Health services through collaboration with RN Care manager, provider, and care team.   Interventions: Evaluation of current treatment plan related to  self management and patient's adherence to plan as established by provider  Transitions of Care:  Goal on track:  Yes. 11/14/23 Durable Medical Equipment (DME) needs assessed with patient/caregiver Doctor Visits  - discussed the importance of doctor visits Post discharge activity limitations prescribed by provider reviewed Discussed current clinical condition with caregiver/ daughter Todd [name]:  "He is so much better after the cardioversion- it only took one shock and he converted to a normal rhythm- and he feels so much better.  He even wanted to go to church and out to eat afterward.  He didn't have to use his portable oxygen, but we had it with [place] in case he needed it.  We had the walker, but he didn't need that either.  He seems like his old self again, so we hope the cardioversion lasts for a long time.  His sugars have still been running on the low side every now and then, but we take care of that with  orange juice and a snack- I am going to talk to Dr. Lawerance Bach about it today when I go for my own appointment" Reviewed recent cardioversion outpatient procedure: daughter verbalizes good understanding of post- procedure instructions; confirms no medication changes post-procedure Confirmed home health services now active through [place]: daughter reports home visits "going fine;" reinforced role of home health services with importance of participation/ ongoing engagement Confirmed uses assistive devices on regular basis, at baseline -- walker; confirmed now using prn, but "most of the time;" provided education/ reinforcement around fall prevention  Provided education around benefit of conservative post-hospital discharge activity; need to pace activity without over-doing  Confirms continuing to use home O2 as per hospital discharge instructions: daughter reports patient "using less often after the cardioversion: he doesn't feel as short of breath after the procedure" Reinforced previously provided education around basic safe use of home O2  Reviewed recent daily weights at home: reported as "all between 129-133 lbs;" daughter confirms no weight gain > 3 lbs overnight- she reviews al daily weights with her frequent visits to patient home Reinforced rationale for daily weight monitoring at home along with weight gain guidelines/ action plan for weight gain; importance of taking diuretic as prescribed: spouse and daughter verbalize excellent understanding of same Confirmed patient  continues monitoring blood sugars at home - using FSL CGM: daughter again reports, "his blood sugars are still running low at times; sometimes in the 60's;" confirms very good understanding of signs/ symptoms/ action plan for episodes of hypoglycemia; reports "they always come right bach up after we give him orange juice and a snack;" daughter reports she has office visit for herself with Dr. Lawerance Bach today: states she is going to update  Dr. Lawerance Bach during her visit; confirms patient is continuing to use recently modified insulin dosing post- recent office visit with patient/ PCP (long-acting insulin to 15 U every day as per PCP instructions) Confirmed no other medication changes since last TOC 30-day program call- she confirms that digoxin prescription was sent in by cardiology provider after care coordination outreach was completed with cardiology provider on 11/06/23 Reviewed upcoming provider office visits: 11/15/23 with CHF/ cardiology provider: confirmed patient is aware of all and has plans to attend as scheduled- Todd [name] will transport/ attend alongside patient Confirmed "sore on his bottom" is "better"-- daughter reports she bought a new cushion for patient to sit on/ use: "it is really helping;" confirms she and patient's spouse continues treating as prescribed and monitoring several times each day- positive reinforcement provided with encouragement to continue efforts TOC 30-day program initial assessment progression today  Patient Goals/Self-Care Activities: Participate in Transition of Care Program/Attend TOC scheduled calls Take all medications as prescribed Attend all scheduled provider appointments Call provider office for new concerns or questions  Continue pacing activity to avoid episodes of shortness of breath Continue using home oxygen as prescribed Continue to follow your established action plan for episodes of shortness of breath Use assistive devices as needed to prevent falls Continue monitoring and recording your blood sugars at home Continue working with the home health team that is involved in your care Keep up the good work taking care of the sacral sore (on his bottom) -- keep following the instructions provided to you by Dr. Lawerance Bach- I am glad it is looking better If you believe your condition is getting worse- contact your care providers (doctors) promptly- reaching out to your doctor early when you have  concerns can prevent you from having to go to the hospital  Follow Up Plan:  Telephone follow up appointment with care management team member scheduled for:  Tuesday 11/20/23 at 2:00 pm         Plan: Telephone follow up appointment with care management team member scheduled for:   Tuesday 11/20/23 at 2:00 pm   Total time spent from review to signing of note/ including any care coordination interventions:  57 minutes  Pls call/ message for questions,  Caryl Pina, RN, BSN, CCRN Alumnus RN Care Manager  Transitions of Care  [facility] - [facility] [phone]: direct office

## 2023-11-15 ENCOUNTER — Telehealth (HOSPITAL_COMMUNITY): Payer: Self-pay

## 2023-11-15 ENCOUNTER — Encounter: Payer: Self-pay | Admitting: Internal Medicine

## 2023-11-15 ENCOUNTER — Encounter (HOSPITAL_COMMUNITY): Payer: Self-pay

## 2023-11-15 ENCOUNTER — Ambulatory Visit (HOSPITAL_COMMUNITY)
Admission: RE | Admit: 2023-11-15 | Discharge: 2023-11-15 | Disposition: A | Discharge status: Home / Self Care | Source: Ambulatory Visit | Attending: Cardiology | Admitting: Cardiology

## 2023-11-15 VITALS — BP 136/77 | HR 68 | Ht 66.0 in | Wt 134.6 lb

## 2023-11-15 DIAGNOSIS — E1165 Type 2 diabetes mellitus with hyperglycemia: Secondary | ICD-10-CM | POA: Diagnosis not present

## 2023-11-15 DIAGNOSIS — Z794 Long term (current) use of insulin: Secondary | ICD-10-CM | POA: Diagnosis not present

## 2023-11-15 DIAGNOSIS — R1312 Dysphagia, oropharyngeal phase: Secondary | ICD-10-CM | POA: Diagnosis not present

## 2023-11-15 DIAGNOSIS — I480 Paroxysmal atrial fibrillation: Secondary | ICD-10-CM | POA: Diagnosis not present

## 2023-11-15 DIAGNOSIS — Z79899 Other long term (current) drug therapy: Secondary | ICD-10-CM | POA: Insufficient documentation

## 2023-11-15 DIAGNOSIS — R42 Dizziness and giddiness: Secondary | ICD-10-CM | POA: Diagnosis not present

## 2023-11-15 DIAGNOSIS — I5021 Acute systolic (congestive) heart failure: Secondary | ICD-10-CM

## 2023-11-15 DIAGNOSIS — J9601 Acute respiratory failure with hypoxia: Secondary | ICD-10-CM | POA: Diagnosis not present

## 2023-11-15 DIAGNOSIS — Z7984 Long term (current) use of oral hypoglycemic drugs: Secondary | ICD-10-CM | POA: Diagnosis not present

## 2023-11-15 DIAGNOSIS — I2510 Atherosclerotic heart disease of native coronary artery without angina pectoris: Secondary | ICD-10-CM | POA: Diagnosis not present

## 2023-11-15 DIAGNOSIS — Z951 Presence of aortocoronary bypass graft: Secondary | ICD-10-CM | POA: Insufficient documentation

## 2023-11-15 DIAGNOSIS — I110 Hypertensive heart disease with heart failure: Secondary | ICD-10-CM | POA: Diagnosis not present

## 2023-11-15 DIAGNOSIS — J849 Interstitial pulmonary disease, unspecified: Secondary | ICD-10-CM | POA: Diagnosis not present

## 2023-11-15 DIAGNOSIS — U071 COVID-19: Secondary | ICD-10-CM | POA: Diagnosis not present

## 2023-11-15 DIAGNOSIS — E785 Hyperlipidemia, unspecified: Secondary | ICD-10-CM | POA: Diagnosis not present

## 2023-11-15 DIAGNOSIS — Z7901 Long term (current) use of anticoagulants: Secondary | ICD-10-CM | POA: Diagnosis not present

## 2023-11-15 DIAGNOSIS — E1151 Type 2 diabetes mellitus with diabetic peripheral angiopathy without gangrene: Secondary | ICD-10-CM | POA: Insufficient documentation

## 2023-11-15 DIAGNOSIS — Z9981 Dependence on supplemental oxygen: Secondary | ICD-10-CM | POA: Diagnosis not present

## 2023-11-15 DIAGNOSIS — J8410 Pulmonary fibrosis, unspecified: Secondary | ICD-10-CM | POA: Diagnosis not present

## 2023-11-15 DIAGNOSIS — I5022 Chronic systolic (congestive) heart failure: Secondary | ICD-10-CM | POA: Diagnosis not present

## 2023-11-15 DIAGNOSIS — Z8616 Personal history of COVID-19: Secondary | ICD-10-CM | POA: Insufficient documentation

## 2023-11-15 LAB — BASIC METABOLIC PANEL WITH GFR
Anion gap: 8 (ref 5–15)
BUN: 25 mg/dL — ABNORMAL HIGH (ref 8–23)
CO2: 30 mmol/L (ref 22–32)
Calcium: 9.2 mg/dL (ref 8.9–10.3)
Chloride: 96 mmol/L — ABNORMAL LOW (ref 98–111)
Creatinine, Ser: 0.85 mg/dL (ref 0.61–1.24)
GFR, Estimated: >60 mL/min (ref >60)
Glucose, Bld: 217 mg/dL — ABNORMAL HIGH (ref 70–99)
Potassium: 4.4 mmol/L (ref 3.5–5.1)
Sodium: 134 mmol/L — ABNORMAL LOW (ref 135–145)

## 2023-11-15 LAB — DIGOXIN LEVEL: Digoxin Level: 2.3 ng/mL — ABNORMAL HIGH (ref 0.8–2.0)

## 2023-11-15 NOTE — Telephone Encounter (Signed)
-----   Message from [place] sent at 11/15/2023  1:30 PM EDT ----- Digoxin level elevated. Stop Digoxin

## 2023-11-15 NOTE — Patient Instructions (Signed)
 Medication Changes:  RESTART [name]   Lab Work:  Labs done today, your results will be available in MyChart, we will contact you for abnormal readings.  Follow-Up in: AS SCHEDULED WITH CARDIOLOGY-- FOLLOW UP WITH [place] AS NEEDED   At the [facility], you and your health needs are our priority. We have a designated team specialized in the treatment of Heart Failure. This Care Team includes your primary Heart Failure Specialized Cardiologist (physician), Advanced Practice Providers (APPs- Physician Assistants and Nurse Practitioners), and Pharmacist who all work together to provide you with the care you need, when you need it.   You may see any of the following providers on your designated Care Team at your next follow up:  Dr. Arvilla Meres Dr. Marca Ancona Dr. Dorthula Nettles Dr. Theresia Bough Tonye Becket, NP Robbie Lis, [place] [facility] [place], [place] Brynda Peon, NP [place] Lee, NP Karle Plumber, PharmD   Please be sure to bring in all your medications bottles to every appointment.   Need to Contact us:  If you have any questions or concerns before your next appointment please send [place] a message through [place] or call our office at [phone].    TO LEAVE A MESSAGE FOR THE NURSE SELECT OPTION 2, PLEASE LEAVE A MESSAGE INCLUDING: YOUR NAME DATE OF BIRTH CALL BACK NUMBER REASON FOR CALL**this is important as we prioritize the call backs  YOU WILL RECEIVE A CALL BACK THE SAME DAY AS LONG AS YOU CALL BEFORE 4:00 PM

## 2023-11-15 NOTE — Telephone Encounter (Signed)
 Spoke with patients daughter and made her aware of results (okay per DPR) and instructions. Medication list updated.   Advised her to call back with issues or concerns and she verbalized understanding.

## 2023-11-15 NOTE — Telephone Encounter (Signed)
 Spoke with Will, gave verbal orders

## 2023-11-19 ENCOUNTER — Other Ambulatory Visit: Payer: Self-pay

## 2023-11-19 DIAGNOSIS — E1151 Type 2 diabetes mellitus with diabetic peripheral angiopathy without gangrene: Secondary | ICD-10-CM

## 2023-11-19 MED ORDER — FREESTYLE LIBRE 3 SENSOR MISC: 1.0000 | Freq: Three times a day (TID) | 5 refills | Status: DC

## 2023-11-20 ENCOUNTER — Other Ambulatory Visit: Payer: Self-pay | Admitting: *Deleted

## 2023-11-20 NOTE — Patient Outreach (Signed)
 Care Management  Transitions of Care Program Transitions of Care Post-discharge week 4/ day # 22   11/20/2023 Name: Todd [name] MRN: [id number] DOB: 10-Jun-1941  Subjective: Todd [name] is a 83 y.o. year old male who is a primary care patient of Burns, [name], MD. The Care Management team Engaged with patient's caregivers by telephone to assess and address transitions of care needs.   Consent to Services:  Patient was given information about care management services, agreed to services, and gave verbal consent to participate. Enrolled into TOC 30-day program:  10/29/23  Assessment: caregiver spouse and daughter Todd [name]:  "He is still so much better, not nearly as short of breath as he was; hardly having to use the home O2 at all anymore, but it's here if he does need it.  Using the walker; PT still coming, he is tolerating the exercises fine.  The sore on his bottom has completely healed up now.  His appetite is not great, but his low blood sugars seem to have evened out- they are not going nearly as low as they were.  We stopped the digoxin like they told us to"    Both caregivers deny clinical concerns today- both report "he is back to his normal self for the most part"          SDOH Interventions    Flowsheet Row Telephone from 10/29/2023 in [place] POPULATION HEALTH DEPARTMENT ED to Hosp-Admission (Discharged) from 10/18/2023 in [place] 5W Medical Specialty PCU Clinical Support from 08/03/2023 in [facility] HealthCare at [place] Telephone from 01/11/2023 in [employer] [facility] Care Coordination Clinical Support from 08/03/2022 in [facility] [place] [facility] at [facility] Chronic Care Management from 02/13/2022 in [facility] at [place]  SDOH Interventions        Food Insecurity Interventions Intervention Not Indicated -- Intervention Not Indicated Intervention Not Indicated Intervention Not Indicated Intervention Not Indicated   [continues to deny food insecurity]  Housing Interventions Intervention Not Indicated Intervention Not Indicated Intervention Not Indicated -- Intervention Not Indicated --  Transportation Interventions Intervention Not Indicated  [daughter provides transportation] Inpatient TOC, Intervention Not Indicated, Patient Resources (Friends/Family) Intervention Not Indicated Intervention Not Indicated  [normally drives self,  daughter assists as needed/ indicated] Intervention Not Indicated Intervention Not Indicated  [patient continues to drive self]  Utilities Interventions Intervention Not Indicated -- Intervention Not Indicated -- Intervention Not Indicated --  Alcohol Usage Interventions -- Intervention Not Indicated (Score <7) Intervention Not Indicated (Score <7) -- Intervention Not Indicated (Score <7) --  Financial Strain Interventions -- Intervention Not Indicated Intervention Not Indicated -- Intervention Not Indicated --  Physical Activity Interventions -- -- Patient Declined -- Intervention Not Indicated --  Stress Interventions -- -- Intervention Not Indicated -- Intervention Not Indicated --  Social Connections Interventions -- -- Intervention Not Indicated -- Intervention Not Indicated --  Health Literacy Interventions -- -- Intervention Not Indicated -- -- --        Goals Addressed             This Visit's Progress    TOC 30-day Program Care Plan   On track    Current Barriers:  Home Health services spouse reports has heard from home health agency: they are to call "today or tomorrow" to schedule initial visit "later this week" Baseline chronic sacral decubitus ulcer: spouse reports, "it's not getting any better;" states this area causes him discomfort and states "Dr. Lawerance Bach knows  about this" 11/20/23: daughter reports sacral decubitus "is now completely healed- there is not even any redness anymore; I just checked it" Resides with supportive spouse: both he and spouse are very  HOH: spouse's phone number is connected to her hearing aid: request all calls be placed to spouse at [phone] Local supportive daughter Todd [name] assists with all care needs as indicated and provides transportation to provider office visits New to home O2: spouse reports on 10/29/23: "we had a problem yesterday with the machine; we are waiting on the company to come out today to address the issue; he is not using the oxygen all the time- only when he gets winded"  Verified home O2 provided by [employer] at time of hospital discharge 11/06/23- verified home O2 issue corrected on 10/29/23- [employer] came to home and resolved issue/ replaced concentrator  2 unplanned hospital admissions x last 12 months  RNCM Clinical Goal(s):  Patient will work with the Care Management team over the next 30 days to address Transition of Care Barriers: Provider appointments Home Health services through collaboration with RN Care manager, provider, and care team.   Interventions: Evaluation of current treatment plan related to  self management and patient's adherence to plan as established by provider  Transitions of Care:  Goal on track:  Yes. 11/20/23 Durable Medical Equipment (DME) needs assessed with patient/caregiver Doctor Visits  - discussed the importance of doctor visits Post discharge activity limitations prescribed by provider reviewed Discussed current clinical condition with caregiver spouse and daughter Todd [name]:  "He is still so much better, not nearly as short of breath as he was; hardly having to use the home O2 at all anymore, but it's here if he does need it.  Using the walker; PT still coming, he is tolerating the exercises fine.  The sore on his bottom has completely healed up now.  His appetite is not great, but his low blood sugars seem to have evened out- they are not going nearly as low as they were.  We stopped the digoxin like they told us to"  Both caregivers deny clinical concerns today- both report "he is  back to his normal self for the most part" Confirmed home health services now active through [place]: daughter reports home visits "going fine;" reinforced role of home health services with importance of participation/ ongoing engagement Confirmed uses assistive devices on regular basis, at baseline -- walker; confirmed now using prn, but "most of the time;" provided education/ reinforcement around fall prevention  Provided reinforcement around benefit of conservative post-hospital discharge activity; need to pace activity without over-doing Confirms continuing to use home O2 as per hospital discharge instructions: caregivers again report patient "using less and less often and usually only for a few minutes;"  They deny that patient has had recent episodes of shortness of breath- reinforced action plan for periods of shortness of breath and both caregivers are able to verbalize excellent understanding of same without prompting Reviewed recent daily weights at home: reported as "128.5 lbs today-- this is what it has been for 3 days in a row;" caregivers confirm no weight gain > 3 lbs overnight Reinforced rationale for daily weight monitoring at home along with weight gain guidelines/ action plan for weight gain; importance of taking diuretic as prescribed: spouse and daughter verbalize excellent understanding of same Confirmed patient continues monitoring blood sugars at home - using FSL CGM: today, caregivers deny recent episodes of low blood sugars; report fasting blood sugar this morning of "157;" and post-lunch  post-prandial value of "258;" caregivers acknowledge higher blood sugar values after eating, along with considerations around chronic prednisone therapy; confirms patient is continuing to use recently modified insulin dosing post- recent office visit with patient/ PCP (long-acting insulin to 15 U every day as per PCP instructions) Reviewed 11/15/23 CHF clinic visit: caregivers report "they have  released him, to just follow up with Dr. Antoine Poche, his regular cardiologist;"  confirmed they discontinued digoxin as advised post- visit Confirmed no other medication changes since last TOC 30-day program call- caregivers report 100% medication adherence and confirm they continue to manage medications for patient Reviewed upcoming provider office visits: 12/06/23- PCP; 12/10/23- pulmonary; 12/20/23- cardiology provider: confirmed patient is aware of all and has plans to attend as scheduled- Todd [name] will transport/ attend alongside patient Confirmed "sore on his bottom" is "now completely healed up;" daughter reports she "checked it just now; no broken skin, not even any redness;" encouraged their ongoing periodic monitoring - positive reinforcement provided with encouragement to continue efforts to maintain skin integrity TOC 30-day program initial assessment completed today  Patient Goals/Self-Care Activities: Participate in Transition of Care Program/Attend TOC scheduled calls Take all medications as prescribed Attend all scheduled provider appointments Call provider office for new concerns or questions  Continue pacing activity to avoid episodes of shortness of breath Continue using home oxygen as prescribed Continue to follow your established action plan for episodes of shortness of breath Use assistive devices as needed to prevent falls Continue monitoring and recording your blood sugars and daily weights at home Continue working with the home health team that is involved in your care If you believe your condition is getting worse- contact your care providers (doctors) promptly- reaching out to your doctor early when you have concerns can prevent you from having to go to the hospital  Follow Up Plan:  Telephone follow up appointment with care management team member scheduled for:  Thursday 11/29/23 at 1:00 pm         Plan: Telephone follow up appointment with care management team member  scheduled for:   Thursday 11/29/23 at 1:00 pm  Total time spent from review to signing of note/ including any care coordination interventions:  45 minutes  Pls call/ message for questions,  Caryl Pina, RN, BSN, CCRN Alumnus RN Care Manager  Transitions of Care  [facility] - [facility] [phone]: direct office

## 2023-11-20 NOTE — Patient Instructions (Signed)
 Visit Information  Thank you for taking time to visit with me today. Please don't hesitate to contact me if I can be of assistance to you before our next scheduled telephone appointment.  Our next appointment is by telephone on Thursday, 11/29/23 at 1:00 pm  Please call the care guide team at [phone] if you need to cancel or reschedule your appointment.   Following are the goals we discussed today:  Patient Goals/Self-Care Activities: Participate in Transition of Care Program/Attend TOC scheduled calls Take all medications as prescribed Attend all scheduled provider appointments Call provider office for new concerns or questions  Continue pacing activity to avoid episodes of shortness of breath Continue using home oxygen as prescribed Continue to follow your established action plan for episodes of shortness of breath Use assistive devices as needed to prevent falls Continue monitoring and recording your blood sugars and daily weights at home Continue working with the home health team that is involved in your care If you believe your condition is getting worse- contact your care providers (doctors) promptly- reaching out to your doctor early when you have concerns can prevent you from having to go to the hospital  If you are experiencing a Mental Health or Behavioral Health Crisis or need someone to talk to, please  call the Suicide and Crisis Lifeline: 988 call the [place] National Suicide Prevention Lifeline: (951)811-9574 or TTY: 785-246-5278 TTY 310-430-2602) to talk to a trained counselor call 1-800-273-TALK (toll free, 24 hour hotline) go to [facility] [address] [place] [phone]) call the [facility] Crisis Line: [phone] call 911   Caregivers verbalize understanding of instructions and care plan provided today and agrees to view in MyChart. Active MyChart status and patient understanding of how to access instructions  and care plan via MyChart confirmed with caregivers.      Caryl Pina, RN, BSN, Media planner  Transitions of Care  [facility] - [facility] [phone]: direct office

## 2023-11-21 ENCOUNTER — Telehealth: Payer: Self-pay | Admitting: Internal Medicine

## 2023-11-21 NOTE — Telephone Encounter (Signed)
 Okay for orders?

## 2023-11-21 NOTE — Telephone Encounter (Unsigned)
 Copied from CRM [id number]. Topic: Clinical - Home Health Verbal Orders >> Nov 21, 2023  2:51 PM Marica Otter wrote: Caller/Agency: [facility] Callback Number: [phone] Secure Line Service Requested: Physical Therapy Frequency: 1 week 5 effective next week Any new concerns about the patient? No

## 2023-11-21 NOTE — Telephone Encounter (Signed)
 Reset to 125- 139

## 2023-11-22 NOTE — Telephone Encounter (Signed)
 Spoke with Todd [name] today and parameters given.

## 2023-11-29 ENCOUNTER — Other Ambulatory Visit: Payer: Self-pay | Admitting: *Deleted

## 2023-11-29 NOTE — Transitions of Care (Post Inpatient/ED Visit) (Signed)
 Transition of Care  week # 5/ day # 31 - TOC 30-day program case closure  Visit Note  11/29/2023  Name: Todd [name] MRN: [id number]          DOB: April 09, 1941  Situation: Patient enrolled in [facility] 30-day program. Visit completed with patient's caregivers: spouse Judy/ daughter Selena Batten by telephone.   Background:  Recent hospitalization February 27- October 25, 2023 for Acute Respiratory Failure with hypoxia/ A-Fib with RVR; COVID-19  Patient participated in TOC-30 day program and has met his previously established goals; today both caregivers deny need for ongoing care management outreach and confirms she has my direct phone number should needs arise in the future: case closure accordingly  Initial Transition Care Management Follow-up Telephone Call    Past Medical History:  Diagnosis Date   Allergic rhinitis    Asthma    Atrial fibrillation with rapid ventricular response (HCC)    a. newly diagnosed 11/03/13, spont conv to NSR, placed on xarelto.   CAD (coronary artery disease)    a. 1999; s/p CABG: LIMA to LAD, SVG to OM1, SVG to OM2, SVG to RCA  b. Normal nuc 10/2013 (done because of new onset AF.)   COVID    Diabetes mellitus (HCC)    HLD (hyperlipidemia)    HTN (hypertension)    Nephrolithiasis    Overweight(278.02)    PVD (peripheral vascular disease) (HCC)    Carotid stenosis   Stroke [facility])    Assessment:  caregiver spouse and daughter Selena Batten:  "He is still doing real good; was even able to go out yesterday for the birthday celebration (wife's birthday); home health still coming in and he is doing fine with it; hardly ever having to use his home O2 anymore, but occasionally he does.  He has started having episodes of diarrhea, right when the farxiga was re-started; but his blood sugars are much better- no more low blood sugars since Dr. Lawerance Bach put the daily insulin on hold- he is doing fine with just the oral medicines and the meal time insulin;"    Both caregivers deny specific clinical  concerns around patient's condition today   Patient Reported Symptoms:  Cognitive Alert and oriented to person, place, and time, Insightful and able to interpret abstract concepts, Normal speech and language skills  Neurological No symptoms reported, Not assessed (not indicated)    HEENT No symptoms reported, Not assessed (not indicated)    Cardiovascular No symptoms reported    Respiratory No symptoms reported shortness of breath  Endocrine No symptoms reported    Gastrointestinal Diarrhea    Genitourinary No symptoms reported, Not assessed (not indicated)    Integumentary No symptoms reported    Musculoskeletal Difficulty walking, Weakness    Psychosocial No symptoms reported, Not assessed (not indicated)     Vitals:   11/29/23 1301  SpO2: 98%    Medications Reviewed Today     Reviewed by Michaela Corner, RN (Registered Nurse) on 11/29/23 at 1248  Med List Status: <None>   Medication Order Taking? Sig Documenting Provider Last Dose Status Informant  acetaminophen (TYLENOL) 650 MG CR tablet [id number] No Take 650 mg by mouth every 8 (eight) hours as needed for pain. [provider] Taking Active Child  atorvastatin (LIPITOR) 80 MG tablet [id number] No Take 1 tablet (80 mg total) by mouth daily.  Patient taking differently: Take 80 mg by mouth at bedtime.   Hermelinda Dellen, MD Taking Active Child  Med Note Wilkie Aye, JEANNETTA   Tue Nov 06, 2023  3:02 PM)    cholecalciferol (VITAMIN D3) 25 MCG (1000 UNIT) tablet [id number] No Take 1,000 Units by mouth daily. [provider] Taking Active Child  Continuous Glucose Receiver (FREESTYLE LIBRE 3 READER) DEVI [id number] No 1 each by Does not apply route 4 (four) times daily -  before meals and at bedtime. Dorcas Carrow, MD Taking Active Child  Continuous Glucose Sensor (DEXCOM G7 SENSOR) [place] [id number] No UAD to monitor sugars  E11.51 [name], MD Taking Active Child  Continuous Glucose Sensor  (FREESTYLE LIBRE 3 SENSOR) [place] [id number]  1 each by Does not apply route 4 (four) times daily -  before meals and at bedtime. Place 1 sensor on the skin every 14 days. Use to check glucose continuously [name], MD  Active   cyanocobalamin (VITAMIN B12) 1000 MCG tablet [id number] No Take 1,000 mcg by mouth daily. [provider] Taking Active Child  dapagliflozin propanediol ([name]) 10 MG TABS tablet [id number] No Take 1 tablet (10 mg total) by mouth daily before breakfast. [name], MD Taking Active Child           Med Note Cindra Presume, JASMINE N   Thu Nov 15, 2023 11:11 AM)    furosemide (LASIX) 40 MG tablet [id number] No Take 1 tablet (40 mg total) by mouth daily. [name], MD Taking Active Child           Med Note Lenoria Farrier   Tue Nov 06, 2023  3:02 PM)    insulin aspart (NOVOLOG) 100 UNIT/ML FlexPen [id number] No Inject 4 Units into the skin 3 (three) times daily with meals. Only take if eating a meal AND Blood Glucose (BG) is 80 or higher.  Patient taking differently: Inject 4 Units into the skin 3 (three) times daily as needed for high blood sugar (Only take if eating a meal AND Blood Glucose (BG) is 80 or higher.).   Dorcas Carrow, MD Taking Active Child  Insulin Pen Needle (PEN NEEDLES) 31G X 5 MM MISC [id number] No 1 each by Does not apply route 3 (three) times daily. May dispense any manufacturer covered by patient's insurance. Dorcas Carrow, MD Taking Active Child  Insulin Pen Needle (PEN NEEDLES) 32G X 5 MM MISC [id number] No UAD for daily lantus injection [name], MD Taking Active Child  ipratropium (ATROVENT) 0.06 % nasal spray [id number] No Place 2-4 sprays into both nostrils every 8 (eight) hours as needed for rhinitis. [provider] Taking Active Child           Med Note (ADSIDE, Butler Denmark M   Thu Nov 01, 2023 10:12 AM)    metFORMIN (GLUCOPHAGE-XR) 500 MG 24 hr tablet [id number] No Take 1 tablet (500 mg total) by mouth 2 (two) times  daily with a meal. Burns, [name], MD Taking Active Child           Med Note (BURNS, [name]   Tue Nov 06, 2023  5:32 AM)    metoprolol tartrate (LOPRESSOR) 100 MG tablet [id number] No TAKE 1 TABLET BY MOUTH 2 TIMES DAILY. Rollene Rotunda, MD Taking Active Child           Med Note Lenoria Farrier   Tue Nov 06, 2023  3:01 PM)    Multiple Vitamin (MULTIVITAMIN PO) [id number] No Take 1 tablet by mouth daily. [provider] Taking Active Child  Nintedanib (OFEV) 100 MG CAPS [id number] No Take 1  capsule (100 mg total) by mouth 2 (two) times daily. Chilton Greathouse, MD Taking Active Child  nitroGLYCERIN (NITROSTAT) 0.4 MG SL tablet [id number] No Place 1 tablet (0.4 mg total) under the tongue every 5 (five) minutes as needed for chest pain. Rollene Rotunda, MD Taking Active Child  Omega-3 Fatty Acids (FISH OIL PO) [id number] No Take 1,030 mg by mouth 2 (two) times daily. [provider] Taking Active Child  omeprazole (PRILOSEC) 40 MG capsule [id number] No TAKE 1 CAPSULE BY MOUTH 2 TIMES DAILY. [name], MD Taking Active Child           Med Note Tamela Gammon Nov 06, 2023  3:02 PM)    [place] LANCETS 33G MISC [id number] No TEST BLOOD SUGAR ONCE DAILY [name], MD Taking Active Child  [facility] VERIO test strip [id number] No USE UP TO 4 TIMES A DAY AS DIRECTED Burns, [name], MD Taking Active Child  PIP PEN NEEDLES 31G X 31G X 5 MM MISC [id number] No USE TO INJECT INSULIN 3 TIMES DAILY. MAY DISPENSE ANY MANUFACTURER COVERED BY PATIENT''S INSURANCE. [provider] Taking Active Child  predniSONE (DELTASONE) 10 MG tablet [id number] No TAKE 1 TABLET (10 MG TOTAL) BY MOUTH DAILY WITH BREAKFAST. Chilton Greathouse, MD Taking Active Child           Med Note Lenoria Farrier   Tue Nov 06, 2023  3:02 PM)    rivaroxaban (XARELTO) 20 MG TABS tablet [id number] No TAKE 1 TABLET (20 MG TOTAL) BY MOUTH DAILY WITH SUPPER. Rollene Rotunda, MD Taking Active Child            Med Note Lenoria Farrier   Tue Nov 06, 2023  3:01 PM)    spironolactone (ALDACTONE) 25 MG tablet [id number] No Take 0.5 tablets (12.5 mg total) by mouth daily. Alen Bleacher, NP Taking Active Child           Recommendation:   PCP Follow-up- as scheduled 12/06/23 Famile caregivers verbalize good understanding of plan of care; advised to continue following established plan of care at home  Follow Up Plan:   Patient participated in TOC-30 day program and has met his previously established goals; today both caregivers deny need for ongoing care management outreach and confirms she has my direct phone number should needs arise in the future  Total time spent from review to signing of note/ including any care coordination interventions:  51 minutes  Pls call/ message for questions,  Caryl Pina, RN, BSN, CCRN Alumnus RN Care Manager  Transitions of Care  [facility] - [facility] [phone]: direct office

## 2023-11-30 ENCOUNTER — Ambulatory Visit: Payer: PPO | Admitting: Cardiology

## 2023-12-03 ENCOUNTER — Other Ambulatory Visit: Payer: Self-pay | Admitting: Internal Medicine

## 2023-12-04 ENCOUNTER — Encounter: Payer: Self-pay | Admitting: Neurology

## 2023-12-05 ENCOUNTER — Encounter: Payer: Self-pay | Admitting: Internal Medicine

## 2023-12-05 DIAGNOSIS — R633 Feeding difficulties, unspecified: Secondary | ICD-10-CM | POA: Diagnosis not present

## 2023-12-05 DIAGNOSIS — R053 Chronic cough: Secondary | ICD-10-CM | POA: Diagnosis not present

## 2023-12-05 DIAGNOSIS — R1314 Dysphagia, pharyngoesophageal phase: Secondary | ICD-10-CM | POA: Diagnosis not present

## 2023-12-05 DIAGNOSIS — R634 Abnormal weight loss: Secondary | ICD-10-CM | POA: Diagnosis not present

## 2023-12-05 DIAGNOSIS — J385 Laryngeal spasm: Secondary | ICD-10-CM | POA: Diagnosis not present

## 2023-12-05 DIAGNOSIS — R1312 Dysphagia, oropharyngeal phase: Secondary | ICD-10-CM | POA: Diagnosis not present

## 2023-12-05 NOTE — Progress Notes (Unsigned)
 Subjective:    Patient ID: Todd [name], male    DOB: Aug 15, 1941, 83 y.o.   MRN: [id number]     HPI Todd [name] is here for follow up of his chronic medical problems.  Diabetes - off the daily insulin.    Taking  [name] 10 mg daily Novolog 4 units tid ac per SS Metformin Xr 500 mg bidac    Medications and allergies reviewed with patient and updated if appropriate.  Current Outpatient Medications on File Prior to Visit  Medication Sig Dispense Refill   acetaminophen (TYLENOL) 650 MG CR tablet Take 650 mg by mouth every 8 (eight) hours as needed for pain.     atorvastatin (LIPITOR) 80 MG tablet Take 1 tablet (80 mg total) by mouth daily. (Patient taking differently: Take 80 mg by mouth at bedtime.) 90 tablet 3   cholecalciferol (VITAMIN D3) 25 MCG (1000 UNIT) tablet Take 1,000 Units by mouth daily.     Continuous Glucose Receiver (FREESTYLE LIBRE 3 READER) [place] 1 each by Does not apply route 4 (four) times daily -  before meals and at bedtime. 1 each 0   Continuous Glucose Sensor (DEXCOM G7 SENSOR) MISC UAD to monitor sugars  E11.51 9 each 3   Continuous Glucose Sensor (FREESTYLE LIBRE 3 SENSOR) MISC 1 each by Does not apply route 4 (four) times daily -  before meals and at bedtime. Place 1 sensor on the skin every 14 days. Use to check glucose continuously 2 each 5   cyanocobalamin (VITAMIN B12) 1000 MCG tablet Take 1,000 mcg by mouth daily.     dapagliflozin propanediol ([name]) 10 MG TABS tablet Take 1 tablet (10 mg total) by mouth daily before breakfast.     furosemide (LASIX) 40 MG tablet Take 1 tablet (40 mg total) by mouth daily.     GLOBAL EASE INJECT PEN NEEDLES 31G X 5 MM MISC USE TO INJECT INSULIN 3 TIMES DAILY. MAY DISPENSE ANY MANUFACTURER COVERED BY PATIENT'S INSURANCE. 100 each 1   insulin aspart (NOVOLOG) 100 UNIT/ML FlexPen Inject 4 Units into the skin 3 (three) times daily with meals. Only take if eating a meal AND Blood Glucose (BG) is 80 or higher. (Patient  taking differently: Inject 4 Units into the skin 3 (three) times daily as needed for high blood sugar (Only take if eating a meal AND Blood Glucose (BG) is 80 or higher.).) 15 mL 0   Insulin Pen Needle (PEN NEEDLES) 32G X 5 MM MISC UAD for daily lantus injection 90 each 3   ipratropium (ATROVENT) 0.06 % nasal spray Place 2-4 sprays into both nostrils every 8 (eight) hours as needed for rhinitis.     metFORMIN (GLUCOPHAGE-XR) 500 MG 24 hr tablet Take 1 tablet (500 mg total) by mouth 2 (two) times daily with a meal. 60 tablet 5   metoprolol tartrate (LOPRESSOR) 100 MG tablet TAKE 1 TABLET BY MOUTH 2 TIMES DAILY. 180 tablet 3   Multiple Vitamin (MULTIVITAMIN PO) Take 1 tablet by mouth daily.     Nintedanib (OFEV) 100 MG CAPS Take 1 capsule (100 mg total) by mouth 2 (two) times daily. 180 capsule 1   nitroGLYCERIN (NITROSTAT) 0.4 MG SL tablet Place 1 tablet (0.4 mg total) under the tongue every 5 (five) minutes as needed for chest pain. 100 tablet 3   Omega-3 Fatty Acids (FISH OIL PO) Take 1,030 mg by mouth 2 (two) times daily.     omeprazole (PRILOSEC) 40 MG capsule TAKE 1  CAPSULE BY MOUTH 2 TIMES DAILY. 120 capsule 5   ONETOUCH [place]ICA LANCETS 33G MISC TEST BLOOD SUGAR ONCE DAILY 100 each 3   ONETOUCH VERIO test strip USE UP TO 4 TIMES A DAY AS DIRECTED 100 strip 0   PIP PEN NEEDLES 31G X 31G X 5 MM MISC USE TO INJECT INSULIN 3 TIMES DAILY. MAY DISPENSE ANY MANUFACTURER COVERED BY PATIENT''S INSURANCE.     predniSONE (DELTASONE) 10 MG tablet TAKE 1 TABLET (10 MG TOTAL) BY MOUTH DAILY WITH BREAKFAST. 30 tablet 5   rivaroxaban (XARELTO) 20 MG TABS tablet TAKE 1 TABLET (20 MG TOTAL) BY MOUTH DAILY WITH SUPPER. 30 tablet 5   spironolactone (ALDACTONE) 25 MG tablet Take 0.5 tablets (12.5 mg total) by mouth daily. 45 tablet 3   No current facility-administered medications on file prior to visit.     Review of Systems     Objective:  There were no vitals filed for this visit. BP Readings from  Last 3 Encounters:  11/15/23 136/77  11/12/23 110/67  11/05/23 110/70   Wt Readings from Last 3 Encounters:  11/29/23 128 lb (58.1 kg)  11/15/23 134 lb 9.6 oz (61.1 kg)  11/05/23 137 lb 12.8 oz (62.5 kg)   There is no height or weight on file to calculate BMI.    Physical Exam     Lab Results  Component Value Date   WBC 8.4 10/21/2023   HGB 12.2 (L) 10/21/2023   HCT 35.7 (L) 10/21/2023   PLT 252 10/21/2023   GLUCOSE 217 (H) 11/15/2023   CHOL 126 09/19/2023   TRIG 183.0 (H) 09/19/2023   HDL 38.20 (L) 09/19/2023   LDLCALC 51 09/19/2023   ALT 45 (H) 10/19/2023   AST 34 10/19/2023   NA 134 (L) 11/15/2023   K 4.4 11/15/2023   CL 96 (L) 11/15/2023   CREATININE 0.85 11/15/2023   BUN 25 (H) 11/15/2023   CO2 30 11/15/2023   TSH 2.87 09/19/2023   PSA 0.24 07/20/2008   INR 1.2 01/08/2023   HGBA1C 12.7 (H) 10/18/2023   MICROALBUR 3.2 (H) 06/06/2023     Assessment & Plan:    See Problem List for Assessment and Plan of chronic medical problems.

## 2023-12-06 ENCOUNTER — Ambulatory Visit (INDEPENDENT_AMBULATORY_CARE_PROVIDER_SITE_OTHER): Payer: PPO | Admitting: Internal Medicine

## 2023-12-06 ENCOUNTER — Ambulatory Visit (INDEPENDENT_AMBULATORY_CARE_PROVIDER_SITE_OTHER)

## 2023-12-06 ENCOUNTER — Telehealth: Payer: Self-pay

## 2023-12-06 ENCOUNTER — Encounter: Payer: Self-pay | Admitting: Internal Medicine

## 2023-12-06 VITALS — BP 110/72 | HR 90 | Temp 98.4°F | Ht 66.0 in | Wt 129.6 lb

## 2023-12-06 DIAGNOSIS — M8000XD Age-related osteoporosis with current pathological fracture, unspecified site, subsequent encounter for fracture with routine healing: Secondary | ICD-10-CM | POA: Diagnosis not present

## 2023-12-06 DIAGNOSIS — E1151 Type 2 diabetes mellitus with diabetic peripheral angiopathy without gangrene: Secondary | ICD-10-CM | POA: Diagnosis not present

## 2023-12-06 DIAGNOSIS — S22080A Wedge compression fracture of T11-T12 vertebra, initial encounter for closed fracture: Secondary | ICD-10-CM | POA: Insufficient documentation

## 2023-12-06 DIAGNOSIS — I5022 Chronic systolic (congestive) heart failure: Secondary | ICD-10-CM

## 2023-12-06 DIAGNOSIS — I10 Essential (primary) hypertension: Secondary | ICD-10-CM

## 2023-12-06 DIAGNOSIS — E7849 Other hyperlipidemia: Secondary | ICD-10-CM | POA: Diagnosis not present

## 2023-12-06 DIAGNOSIS — S32030D Wedge compression fracture of third lumbar vertebra, subsequent encounter for fracture with routine healing: Secondary | ICD-10-CM

## 2023-12-06 DIAGNOSIS — M47816 Spondylosis without myelopathy or radiculopathy, lumbar region: Secondary | ICD-10-CM | POA: Diagnosis not present

## 2023-12-06 DIAGNOSIS — Z7984 Long term (current) use of oral hypoglycemic drugs: Secondary | ICD-10-CM | POA: Diagnosis not present

## 2023-12-06 DIAGNOSIS — M810 Age-related osteoporosis without current pathological fracture: Secondary | ICD-10-CM | POA: Insufficient documentation

## 2023-12-06 DIAGNOSIS — M5126 Other intervertebral disc displacement, lumbar region: Secondary | ICD-10-CM | POA: Diagnosis not present

## 2023-12-06 DIAGNOSIS — E538 Deficiency of other specified B group vitamins: Secondary | ICD-10-CM | POA: Diagnosis not present

## 2023-12-06 DIAGNOSIS — I5021 Acute systolic (congestive) heart failure: Secondary | ICD-10-CM | POA: Diagnosis not present

## 2023-12-06 DIAGNOSIS — J9601 Acute respiratory failure with hypoxia: Secondary | ICD-10-CM | POA: Diagnosis not present

## 2023-12-06 LAB — LIPID PANEL
Cholesterol: 117 mg/dL (ref 0–200)
HDL: 42.7 mg/dL (ref >39.00)
LDL Cholesterol: 51 mg/dL (ref 0–99)
NonHDL: 74.26
Total CHOL/HDL Ratio: 3
Triglycerides: 114 mg/dL (ref 0.0–149.0)
VLDL: 22.8 mg/dL (ref 0.0–40.0)

## 2023-12-06 LAB — COMPREHENSIVE METABOLIC PANEL WITH GFR
ALT: 32 U/L (ref 0–53)
AST: 30 U/L (ref 0–37)
Albumin: 3.8 g/dL (ref 3.5–5.2)
Alkaline Phosphatase: 104 U/L (ref 39–117)
BUN: 43 mg/dL — ABNORMAL HIGH (ref 6–23)
CO2: 29 meq/L (ref 19–32)
Calcium: 9.5 mg/dL (ref 8.4–10.5)
Chloride: 98 meq/L (ref 96–112)
Creatinine, Ser: 1.03 mg/dL (ref 0.40–1.50)
GFR: 68.44 mL/min (ref >60.00)
Glucose, Bld: 161 mg/dL — ABNORMAL HIGH (ref 70–99)
Potassium: 4.3 meq/L (ref 3.5–5.1)
Sodium: 138 meq/L (ref 135–145)
Total Bilirubin: 0.9 mg/dL (ref 0.2–1.2)
Total Protein: 7.4 g/dL (ref 6.0–8.3)

## 2023-12-06 LAB — CBC WITH DIFFERENTIAL/PLATELET
Basophils Absolute: 0 10*3/uL (ref 0.0–0.1)
Basophils Relative: 0.1 % (ref 0.0–3.0)
Eosinophils Absolute: 0 10*3/uL (ref 0.0–0.7)
Eosinophils Relative: 0 % (ref 0.0–5.0)
HCT: 40.8 % (ref 39.0–52.0)
Hemoglobin: 13.3 g/dL (ref 13.0–17.0)
Lymphocytes Relative: 15.6 % (ref 12.0–46.0)
Lymphs Abs: 1.9 10*3/uL (ref 0.7–4.0)
MCHC: 32.5 g/dL (ref 30.0–36.0)
MCV: 107.3 fl — ABNORMAL HIGH (ref 78.0–100.0)
Monocytes Absolute: 0.8 10*3/uL (ref 0.1–1.0)
Monocytes Relative: 6.7 % (ref 3.0–12.0)
Neutro Abs: 9.6 10*3/uL — ABNORMAL HIGH (ref 1.4–7.7)
Neutrophils Relative %: 77.6 % — ABNORMAL HIGH (ref 43.0–77.0)
Platelets: 288 10*3/uL (ref 150.0–400.0)
RBC: 3.81 Mil/uL — ABNORMAL LOW (ref 4.22–5.81)
RDW: 18.1 % — ABNORMAL HIGH (ref 11.5–15.5)
WBC: 12.4 10*3/uL — ABNORMAL HIGH (ref 4.0–10.5)

## 2023-12-06 LAB — VITAMIN D 25 HYDROXY (VIT D DEFICIENCY, FRACTURES): VITD: 114.65 ng/mL (ref 30.00–100.00)

## 2023-12-06 LAB — HEMOGLOBIN A1C: Hgb A1c MFr Bld: 8.6 % — ABNORMAL HIGH (ref 4.6–6.5)

## 2023-12-06 LAB — VITAMIN B12: Vitamin B-12: >1537 pg/mL — ABNORMAL HIGH (ref 211–911)

## 2023-12-06 MED ORDER — CALCITONIN (SALMON) 200 UNIT/ACT NA SOLN: 1.0000 | Freq: Every day | NASAL | 0 refills | Status: DC

## 2023-12-06 MED ORDER — ALENDRONATE SODIUM 70 MG PO TABS: 70.0000 mg | ORAL_TABLET | ORAL | 3 refills | Status: DC

## 2023-12-06 NOTE — Telephone Encounter (Signed)
 Results read by daughter today.

## 2023-12-06 NOTE — Assessment & Plan Note (Signed)
 New Compression fracture L3 08/2023 and now T12/2025 Likely related to chronic steroids for lung disease Given acute compression fracture will start calcitonin nasal spray x 1 month and then start Fosamax weekly Will get a bone density scan at some point, but clinically has osteoporosis we will begin treatment Continue vitamin D supplementation, multivitamin Check vitamin D level

## 2023-12-06 NOTE — Assessment & Plan Note (Signed)
Chronic Continue atorvastatin 80 mg daily Check lipid panel, cmp Regular exercise and healthy diet encouraged

## 2023-12-06 NOTE — Assessment & Plan Note (Signed)
 Chronic Sugars sound better controlled at home-occasional high sugars, but overall some controlled Still on chronic prednisone 10 mg daily Daily insulin stopped because of low sugars Continue novolog 4 units with meals if glucose is > 80 Continue metformin XR 500 mg twice daily AC, [name] 10 mg daily

## 2023-12-06 NOTE — Assessment & Plan Note (Signed)
 Chronic Appears euvolemic Continue furosemide 40 mg daily, metoprolol 100 mg twice daily CMP

## 2023-12-06 NOTE — Assessment & Plan Note (Signed)
 Acute Having increased lower back pain x 2-3 weeks without obvious cause X-ray today shows acute T12 compression fracture Start calcitonin nasal spray daily-alternate nostrils x 30 days Then start Fosamax 70 mg weekly Will get a bone density scan at some point, but clinically has osteoporosis and will start treatment

## 2023-12-06 NOTE — Assessment & Plan Note (Signed)
 Chronic Blood pressure controlled Continue metoprolol 100 mg twice daily, spironolactone 12.5 mg daily

## 2023-12-06 NOTE — Assessment & Plan Note (Signed)
 New in 08/2023 Stable on x-ray today Having increased pain and x-ray today showed new T12 compression fracture Start calcitonin nasal spray daily x 1 month then start Fosamax 70 mg weekly Clinically has osteoporosis so we will start treatment, but will eventually get bone density scan

## 2023-12-06 NOTE — Patient Instructions (Addendum)
      Blood work was ordered.    Have an xray today    Medications changes include :   fosamax 70 mg once a week     Return in about 6 months (around 06/06/2024) for follow up.

## 2023-12-06 NOTE — Telephone Encounter (Signed)
 I commented on his blood work results-when you call his daughter regarding his x-ray please let her know to read those results with my instructions regarding the vitamin D

## 2023-12-10 ENCOUNTER — Ambulatory Visit: Payer: PPO | Admitting: Pulmonary Disease

## 2023-12-10 ENCOUNTER — Encounter: Payer: Self-pay | Admitting: Pulmonary Disease

## 2023-12-10 VITALS — BP 133/95 | HR 77 | Ht 68.0 in | Wt 129.0 lb

## 2023-12-10 DIAGNOSIS — Z5181 Encounter for therapeutic drug level monitoring: Secondary | ICD-10-CM

## 2023-12-10 DIAGNOSIS — J849 Interstitial pulmonary disease, unspecified: Secondary | ICD-10-CM

## 2023-12-10 DIAGNOSIS — I4891 Unspecified atrial fibrillation: Secondary | ICD-10-CM | POA: Diagnosis not present

## 2023-12-10 DIAGNOSIS — R053 Chronic cough: Secondary | ICD-10-CM

## 2023-12-10 NOTE — Patient Instructions (Signed)
 VISIT SUMMARY:  You came in today for a follow-up on your chronic cough and medication management. We discussed your history of atrial fibrillation, pulmonary fibrosis, and recent health events including your hospitalization and COVID-19 diagnosis. We also reviewed your current medications and their effects.  YOUR PLAN:  -PULMONARY FIBROSIS: Pulmonary fibrosis is a condition where the lung tissue becomes scarred and stiff, making it difficult to breathe. Your condition is currently well-managed with Esbriet , and your last scan showed no significant changes. We will continue with Esbriet  and schedule a follow-up scan and lung function test in 3-4 months.  -CHRONIC COUGH: Your chronic cough has significantly improved with superior laryngeal nerve blocks, which contain steroids. Although you still experience some coughing when taking deep breaths, there has been an overall 85% improvement.  -LONG-TERM PREDNISONE  USE: Long-term use of prednisone , a steroid, can lead to side effects such as worsening atrial fibrillation, high blood sugar, and bone issues. We plan to reduce your prednisone  dose to 5 mg daily and monitor your response. If stable, we may further reduce it to 2.5 mg at your next visit.  -ATRIAL FIBRILLATION: Atrial fibrillation is an irregular and often rapid heart rate that can lead to blood clots, stroke, and other heart-related complications. Your condition has been exacerbated by high-dose prednisone , and you were previously hospitalized due to complications.  -COMPRESSION DEFORMITIES: Compression deformities are changes in the shape of your spine, often due to weakened bones. This may be related to long-term prednisone  use. You have reported back pain, which we will continue to monitor.  -DIABETES MELLITUS: Diabetes mellitus is a condition that affects how your body processes blood sugar. Your blood sugar levels were previously very high during hospitalization, but your recent A1c levels  have improved, though they are not yet optimal.  -COVID-19: You were diagnosed with COVID-19 during your hospitalization, but you did not have significant symptoms, and there was no transmission to your family or friends.  INSTRUCTIONS:  Please continue taking Esbriet  as prescribed and reduce your prednisone  dose to 5 mg daily. We will schedule a follow-up scan and lung function test in 3-4 months. Monitor your symptoms and blood sugar levels, and report any significant changes. If stable, we may further reduce your prednisone  dose to 2.5 mg at your next visit.

## 2023-12-10 NOTE — Progress Notes (Signed)
 Todd [name]    [id number]    14-Jun-1941  Primary Care Physician:Burns, Beckey Bourgeois, MD  Referring Physician: Colene Dauphin, MD [address] [place],  [place] [address]  Problem list: Follow up for cough Progressive pulmonary fibrosis, Asbestosis Post COVID-19 in September 2021 Esbriet  attempted in 2023 but stopped due to side effects Started Ofev  December 2023  HPI: [name] is a 83 y.o. with history of allergies, rhinitis, atrial fibrillation, hypertension, hyperlipidemia, diabetes, GERD Follow-up for asbestosis, chronic cough, COVID-19 infection in September 2021  Developed COVID-19 in September 2021.  He was sick for about a week.  Treated with outpatient monoclonal antibody therapy and did not require hospitalization His cough has worsened since his COVID-19 infection.  He was put on prednisone  for a month with slow taper with improvement in symptoms Continues to have persistent cough.  ENT examination in 2022 was normal  Started Esbriet  in January 2023 for progressive pulmonary fibrosis.  But had to stop in 3 months after he developed significant sunburn. Started on Ofev  in Dec 2023  Pets: None Occupation: Retired, used to work in [employer] Exposures: Significant exposure to asbestos in previous line of work Smoking history: None  Interim history Discussed the use of AI scribe software for clinical note transcription with the patient, who gave verbal consent to proceed.  History of Present Illness Todd [name] is an 83 year old male with atrial fibrillation and progressive pulmonary fibrosis who presents for follow-up of his chronic cough and medication management.  He has a history of atrial fibrillation, which recently required hospitalization due to an exacerbation. During this episode, his blood sugar levels were elevated to 600, and he underwent cardioversion. He was also diagnosed with incidental COVID-19 during this hospitalization, although the family  questions the accuracy of this diagnosis as no one else in close contact contracted the virus.  He has been following with Dr. Brent Cambric, ENT at [facility] and receiving superior laryngeal nerve blocks for a chronic cough, which have improved his symptoms by approximately 85%. However, he still experiences coughing when taking deep breaths. He has undergone multiple swallowing tests, which show no aspiration but some residual material in the throat.  He has a history of progressive pulmonary fibrosis and asbestosis. He was previously without Esbriet  for about five months but has resumed the medication. He is currently on prednisone  10 mg daily, which has been associated with exacerbations of his atrial fibrillation and elevated blood sugars in the past. He has been on prednisone  since November 2024, and there are concerns about its impact on his bone health, as recent MRIs indicate compression deformities.  His A1c levels were checked recently and have improved, though they are not yet optimal. He reports significant weight loss, which has led to discomfort due to lack of padding, particularly when sitting.    Outpatient Encounter Medications as of 12/10/2023  Medication Sig   acetaminophen  (TYLENOL ) 650 MG CR tablet Take 650 mg by mouth every 8 (eight) hours as needed for pain.   alendronate  (FOSAMAX ) 70 MG tablet Take 1 tablet (70 mg total) by mouth every 7 (seven) days. Take with a full glass of water on an empty stomach.   atorvastatin  (LIPITOR ) 80 MG tablet Take 1 tablet (80 mg total) by mouth daily. (Patient taking differently: Take 80 mg by mouth at bedtime.)   calcitonin, salmon, (MIACALCIN/FORTICAL) 200 UNIT/ACT nasal spray Place 1 spray into alternate nostrils daily.   cholecalciferol (  VITAMIN D3) 25 MCG (1000 UNIT) tablet Take 1,000 Units by mouth daily.   Continuous Glucose Receiver (FREESTYLE LIBRE 3 READER) DEVI 1 each by Does not apply route 4 (four) times daily -  before meals and at  bedtime.   Continuous Glucose Receiver (FREESTYLE LIBRE 3 READER) DEVI USE AS DIRECTED FOR CONTINUOUS BLOOD GLUCOSE MONITORING (CHECK BEFORE MEALS AND AT BEDTIME AS DIRECTED)   Continuous Glucose Sensor (FREESTYLE LIBRE 3 SENSOR) MISC 1 each by Does not apply route 4 (four) times daily -  before meals and at bedtime. Place 1 sensor on the skin every 14 days. Use to check glucose continuously   Continuous Glucose Sensor (FREESTYLE LIBRE 3 SENSOR) MISC PLACE 1 SENSOR ON THE SKIN EVERY 14 DAYS. USE TO CHECK GLUCOSE CONTINUOUSLY BEFORE MEALS AND AT BEDTIME.   cyanocobalamin  (VITAMIN B12) 1000 MCG tablet Take 1,000 mcg by mouth daily.   dapagliflozin  propanediol ([name] ) 10 MG TABS tablet Take 1 tablet (10 mg total) by mouth daily before breakfast.   furosemide  (LASIX ) 40 MG tablet Take 1 tablet (40 mg total) by mouth daily.   GLOBAL EASE INJECT PEN NEEDLES 31G X 5 MM MISC USE TO INJECT INSULIN  3 TIMES DAILY. MAY DISPENSE ANY MANUFACTURER COVERED BY PATIENT'S INSURANCE.   insulin  aspart (NOVOLOG ) 100 UNIT/ML FlexPen Inject 4 Units into the skin 3 (three) times daily with meals. Only take if eating a meal AND Blood Glucose (BG) is 80 or higher. (Patient taking differently: Inject 4 Units into the skin 3 (three) times daily as needed for high blood sugar (Only take if eating a meal AND Blood Glucose (BG) is 80 or higher.).)   Insulin  Pen Needle (PEN NEEDLES) 32G X 5 MM MISC UAD for daily lantus  injection   ipratropium (ATROVENT ) 0.06 % nasal spray Place 2-4 sprays into both nostrils every 8 (eight) hours as needed for rhinitis.   metFORMIN  (GLUCOPHAGE -XR) 500 MG 24 hr tablet Take 1 tablet (500 mg total) by mouth 2 (two) times daily with a meal.   metoprolol  tartrate (LOPRESSOR ) 100 MG tablet TAKE 1 TABLET BY MOUTH 2 TIMES DAILY.   Multiple Vitamin (MULTIVITAMIN PO) Take 1 tablet by mouth daily.   Nintedanib  (OFEV ) 100 MG CAPS Take 1 capsule (100 mg total) by mouth 2 (two) times daily.   nitroGLYCERIN   (NITROSTAT ) 0.4 MG SL tablet Place 1 tablet (0.4 mg total) under the tongue every 5 (five) minutes as needed for chest pain.   Omega-3 Fatty Acids (FISH OIL PO) Take 1,030 mg by mouth 2 (two) times daily.   omeprazole  (PRILOSEC) 40 MG capsule TAKE 1 CAPSULE BY MOUTH 2 TIMES DAILY.   ONETOUCH DELICA LANCETS 33G MISC TEST BLOOD SUGAR ONCE DAILY   ONETOUCH VERIO test strip USE UP TO 4 TIMES A DAY AS DIRECTED   PIP PEN NEEDLES 31G X 31G X 5 MM MISC USE TO INJECT INSULIN  3 TIMES DAILY. MAY DISPENSE ANY MANUFACTURER COVERED BY PATIENT''S INSURANCE.   predniSONE  (DELTASONE ) 10 MG tablet TAKE 1 TABLET (10 MG TOTAL) BY MOUTH DAILY WITH BREAKFAST.   rivaroxaban  (XARELTO ) 20 MG TABS tablet TAKE 1 TABLET (20 MG TOTAL) BY MOUTH DAILY WITH SUPPER.   spironolactone  (ALDACTONE ) 25 MG tablet Take 0.5 tablets (12.5 mg total) by mouth daily.   No facility-administered encounter medications on file as of 12/10/2023.   Physical Exam: Blood pressure (!) 133/95, pulse 77, height 5\' 8"  (1.727 m), weight 129 lb (58.5 kg), SpO2 99%. Gen:      No  acute distress HEENT:  EOMI, sclera anicteric Neck:     No masses; no thyromegaly Lungs:    Bibasal crackles CV:         Regular rate and rhythm; no murmurs Abd:      + bowel sounds; soft, non-tender; no palpable masses, no distension Ext:    No edema; adequate peripheral perfusion Skin:      Warm and dry; no rash Neuro: alert and oriented x 3 Psych: normal mood and affect   Data Reviewed: Imaging Chest x-ray 12/04/16-stable interstitial opacities CT abdomen 11/20/13-mild reticular opacities at the bases CT high-resolution 01/02/17- subpleural reticulation, mild traction bronchiectasis, possible early honeycombing with basal gradient. 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  Slightly worse compared to 2018 High-res CT 03/21/2019-probable UIP High-res CT 03/30/2020-unchanged probable UIP fibrosis High-res CT 09/02/2020- unchanged probable UIP fibrosis High-res  CT 06/30/2021-slight worsening of fibrosis and probable UIP pattern High-res CT 03/28/2023-stable pattern of UIP pulmonary fibrosis. I have reviewed the images personally  PFTs  03/20/17- FVC 2.74 (72%], FEV1 2.38 (87%), F/F 87, TLC 62%, DLCO 15.89 (56%)  01/15/2018-FVC 2.74 [73%], FEV1 2.33 [86%], F/F 85, TLC 71%, DLCO 13.13 (46%)  07/23/2018-FVC 2.51 [69%), FEV1 2.10 [78%), DLCO 15.16 (53%) Mild restriction, moderate-severe diffusion defect.  04/02/2019 FVC 2.70 [72%], FEV1 2.18 [82%], F/F 81, DLCO 13.8 [60%] Moderate diffusion defect  08/31/2020 FVC 2.52 [65%], FEV1 2.25 [82%], F/F 89, TLC 4.64 [69%], DLCO 20.59 [88%] Mild restriction.  Improvement in diffusion capacity  08/24/2021 FVC 2.03 [53%], FEV1 1.53 [56%], F/F 75, TLC 2.78 [41%], DLCO 13.45 [58%] Severe restriction, moderate diffusion defect  07/24/2022 Unable to complete due to cough  FENO 12/18/16- 12  Labs ILD serologies 01/09/17- all negative except for mild elevation in SCL 75.  CMP 06/23/22- Normal liver function Assessment & Plan Progressive pulmonary fibrosis, asbestos exposure. 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   Started Esbriet  in January 2023 but we had to stop it as he developed significant sunburn. Now on Ofev  after review of CT scan showing interval progression of pulmonary fibrosis we have decided to initiate therapy.  Started at a lower dose of 100 mg twice daily as he already has some diarrhea at baseline. Last scan showed no significant changes. Due for another scan in 3-4 months to assess progression.  - Continue Ofev . Recent hepatic panel last month is within normal limits - Schedule follow-up scan and lung function test in 3-4 months.  Chronic Cough Likely secondary to pulmonary fibrosis He has been treated adequately for GERD and tried on antihistamines, gabapentin , amitriptyline  which did not help Chronic cough significantly improved with superior laryngeal  nerve blocks.  Follows with Dr. Brent Cambric at [facility]. Cough exacerbated by deep breaths but overall 85% improvement.  Long-term Prednisone  Use Long-term prednisone  use leading to potential side effects including exacerbation of atrial fibrillation, elevated blood sugars, and possible contribution to compression deformities. Plan to taper prednisone  due to these adverse effects.  - Reduce prednisone  dose to 5 mg daily. - Monitor response to prednisone  taper until next clinic visit. - Consider further tapering to 2.5 mg if stable at next visit.  Atrial Fibrillation Atrial fibrillation exacerbated by high-dose prednisone . Hospitalized previously due to complications.  Compression Deformities Compression deformities noted on recent MRI, possibly related to long-term prednisone  use. Reports back pain.  Diabetes Mellitus Diabetes mellitus with previously elevated blood sugars during hospitalization. Recent A1c improved but not optimal.  COVID-19 Recent incidental COVID-19 diagnosis during hospitalization. No significant  symptoms reported, and no transmission to family or friends.    Health maintenance 01/15/2015-Prevnar 13 05/05/2010-Pneumovax  Plan/Recommendations: Continue Ofev  100 mg twice daily Reduce prednisone  to 5 mg PPI, ENT follow-up  Phyllis Breeze MD [facility] 12/10/2023, 3:59 PM  CC: Colene Dauphin, MD

## 2023-12-13 ENCOUNTER — Other Ambulatory Visit: Payer: Self-pay

## 2023-12-13 DIAGNOSIS — R270 Ataxia, unspecified: Secondary | ICD-10-CM

## 2023-12-13 DIAGNOSIS — M48061 Spinal stenosis, lumbar region without neurogenic claudication: Secondary | ICD-10-CM

## 2023-12-13 NOTE — Telephone Encounter (Signed)
 Pt. Wife calling back after missed call from [facility]

## 2023-12-13 NOTE — Telephone Encounter (Signed)
 Copied from CRM [id number]. Topic: General - Other >> Dec 13, 2023  9:33 AM Jethro Morrison wrote: Reason for CRM: TOMMY WITH [facility] WANTED DR. BURNS KNOW PATIENT IS HAVING PAIN IN HIS LOWER BACK AND STATED IT WAS A 7 TODAY. THANKS

## 2023-12-19 ENCOUNTER — Telehealth: Payer: Self-pay

## 2023-12-19 ENCOUNTER — Other Ambulatory Visit: Payer: Self-pay | Admitting: Cardiology

## 2023-12-19 NOTE — Progress Notes (Signed)
 Cardiology Office Note:   Date:  12/20/2023  ID:  Todd [name], DOB 01/11/1941, MRN [id number] PCP: Colene Dauphin, MD  [facility] Providers Cardiologist:  Todd Grates, MD Electrophysiologist:  Boyce Byes, MD {  History of Present Illness:   Todd [name] is a 83 y.o. male  with a history of  CABG x4 and paroxysmal atrial fibrillation. He has LIMA to LAD, SVG to OM1, SVG to OM2, SVG to RCA. He was recently admitted from 01/07/2023 to 01/09/2023 for atrial fibrillation with RVR after presenting with chest pressure and was found to have an elevated troponin. High-sensitivity troponin peaked at 5,977. Echo showed LVEF of 55-60% with no regional wall motion abnormalities and mild LVH. He underwent LHC which showed patent LIMA to LAD, SVG to RCA, and SVG to OM2 with 65% stenosis of the proximal SVG. Elevated troponin was felt to be due to demand ischemia in the setting of rapid atrial fibrillation rather than true ACS. Continued medical therapy was recommended with consideration of PCI to SVG to OM2 if he has refractory angina. He was placed IV Diltiazem  and converted back to sinus rhythm and then was transitioned back to PO Metoprolol .   He has had persistent atrial fib and was treated with Tikosyn.  He was having increased leg edema.  EF was 45% on echo in Feb 2025.  The EF was felt to be down because of recurrent atrial fib.  He was managed with rate control.  He was COVID positive at the time and the thought was that he would need DCCV if he had continued fib.      Unfortunately he has continued to do relatively poorly.  He is very weak.  He looks much more frail than when I saw him last.  He is not having any chest pressure, neck or arm discomfort.  He does have chronic dyspnea.  He is very fatigued.  He said he did feel better after cardioversion and he thought that he might be back in A-fib because he just felt weaker.  He might feel his heart racing or skipping a little bit.  He is not  having any presyncope or syncope.  He denies any chest pressure, neck or arm discomfort.  He has had chronic lower extremity edema.  He is being seen actively by pulmonary and has as needed O2.  He is due to see them and get a follow-up CT.      ROS: As stated in the HPI and negative for all other systems.  Studies Reviewed:    EKG:   EKG Interpretation Date/Time:  Thursday Dec 20 2023 14:06:32 EDT Ventricular Rate:  125 PR Interval:    QRS Duration:  78 QT Interval:  332 QTC Calculation: 479 R Axis:   -31  Text Interpretation: Atrial fibrillation with rapid ventricular response Left axis deviation Left ventricular hypertrophy Inferior infarct , age undetermined ST & T wave abnormality, consider lateral ischemia When compared with ECG of 15-Nov-2023 11:14, Atrial fibrillation has replaced Normal sinus rhythm Confirmed by Todd [name] ([id number]) on 12/20/2023 2:39:24 PM     Risk Assessment/Calculations:    CHA2DS2-VASc Score = 6   This indicates a 9.7% annual risk of stroke. The patient's score is based upon: CHF History: 1 HTN History: 1 Diabetes History: 1 Stroke History: 0 Vascular Disease History: 1 Age Score: 2 Gender Score: 0  VS:  BP 112/70   Pulse (!) 125   Ht 5\' 8"  (1.727 m)  Wt 127 lb 3.2 oz (57.7 kg)   SpO2 97%   BMI 19.34 kg/m    Wt Readings from Last 3 Encounters:  12/20/23 127 lb 3.2 oz (57.7 kg)  12/10/23 129 lb (58.5 kg)  12/06/23 129 lb 9.6 oz (58.8 kg)     GEN: Well nourished, well developed in no acute distress NECK: No JVD; No carotid bruits CARDIAC: Irregular RR, no murmurs, rubs, gallops RESPIRATORY:  Clear to auscultation without rales, wheezing or rhonchi  ABDOMEN: Soft, non-tender, non-distended EXTREMITIES:  No edema; No deformity   ASSESSMENT AND PLAN:   Persistent atrial fibrillation: I had a long conversation with the patient and extensive review of his chart.  He really would not want ablation and I do not think he be a good candidate.   I do think he is symptomatic in the fibrillation although it is difficult to sort out his symptoms from his severe progressive lung disease.  At this point I think we should try Tikosyn and and possible cardioversion.  He has agreed to this.  He has been compliant with and not missed any of his anticoagulation.  He is up-to-date with blood work.  I talked to the atrial fibrillation clinic and they will call him for elective admission.   Acute systolic heart failure: EF 45% on echocardiogram 10/11/2023.  Previous EF 55 to 60% on 01/08/2023.  No change in therapy.  His reduced ejection fraction is probably related to his fibrillation.   He does not seem to have any left-sided failure symptoms and tolerates current meds.  No change in therapy.  Lower extremity edema: This is at baseline and mild.  Will continue with current diuretics.   CAD s/p CABG 1999: He has had no new anginal symptoms since his catheterization most recently in 2024.  History of pulmonary fibrosis: He remains on Ofev  and being followed by pulmonary.    Hypertension: His blood pressure has been running on the low side.  Will continue the meds as listed.   Hyperlipidemia LDL was 51.  No change in therapy.  DM2: A1c was 8.6 which is down from previous and being exacerbated by his not infrequent steroid use.  I will defer to his primary provider.     Follow up follow-up with me after his hospitalization.  She is not  Signed, Todd Grates, MD

## 2023-12-19 NOTE — Telephone Encounter (Signed)
 Okay for orders?

## 2023-12-19 NOTE — Telephone Encounter (Signed)
 Copied from CRM [id number]. Topic: Clinical - Home Health Verbal Orders >> Dec 19, 2023  1:51 PM Ovid Blow wrote: Caller/Agency: Myra from [facility] Callback Number: [phone]  Service Requested: Physical Therapy Frequency: re certification visit for next week - then will call back with frequency Any new concerns about the patient? No

## 2023-12-19 NOTE — Telephone Encounter (Signed)
 Verbals given today.

## 2023-12-20 ENCOUNTER — Other Ambulatory Visit: Payer: Self-pay | Admitting: Cardiology

## 2023-12-20 ENCOUNTER — Encounter (HOSPITAL_COMMUNITY): Payer: Self-pay

## 2023-12-20 ENCOUNTER — Ambulatory Visit: Payer: PPO | Attending: Cardiology | Admitting: Cardiology

## 2023-12-20 ENCOUNTER — Encounter: Payer: Self-pay | Admitting: Cardiology

## 2023-12-20 VITALS — BP 112/70 | HR 125 | Ht 68.0 in | Wt 127.2 lb

## 2023-12-20 DIAGNOSIS — R609 Edema, unspecified: Secondary | ICD-10-CM | POA: Diagnosis not present

## 2023-12-20 DIAGNOSIS — E785 Hyperlipidemia, unspecified: Secondary | ICD-10-CM

## 2023-12-20 DIAGNOSIS — I4819 Other persistent atrial fibrillation: Secondary | ICD-10-CM

## 2023-12-20 DIAGNOSIS — I5021 Acute systolic (congestive) heart failure: Secondary | ICD-10-CM

## 2023-12-20 DIAGNOSIS — I2510 Atherosclerotic heart disease of native coronary artery without angina pectoris: Secondary | ICD-10-CM

## 2023-12-20 DIAGNOSIS — E118 Type 2 diabetes mellitus with unspecified complications: Secondary | ICD-10-CM | POA: Diagnosis not present

## 2023-12-20 DIAGNOSIS — I10 Essential (primary) hypertension: Secondary | ICD-10-CM

## 2023-12-20 NOTE — Patient Instructions (Signed)
 Medication Instructions:  Your physician recommends that you continue on your current medications as directed. Please refer to the Current Medication list given to you today.    *If you need a refill on your cardiac medications before your next appointment, please call your pharmacy*   Lab Work: None    If you have labs (blood work) drawn today and your tests are completely normal, you will receive your results only by: MyChart Message (if you have MyChart) OR A paper copy in the mail If you have any lab test that is abnormal or we need to change your treatment, we will call you to review the results.   Testing/Procedures: Dr. HOCHREIN will be admitting you to the hospital for a [facility]  treatment.    Follow-Up: At [facility], you and your health needs are our priority.  As part of our continuing mission to provide you with exceptional heart care, we have created designated Provider Care Teams.  These Care Teams include your primary Cardiologist (physician) and Advanced Practice Providers (APPs -  Physician Assistants and Nurse Practitioners) who all work together to provide you with the care you need, when you need it.  We recommend signing up for the patient portal called "MyChart".  Sign up information is provided on this After Visit Summary.  MyChart is used to connect with patients for Virtual Visits (Telemedicine).  Patients are able to view lab/test results, encounter notes, upcoming appointments, etc.  Non-urgent messages can be sent to your provider as well.   To learn more about what you can do with MyChart, go to [email or web address].    Your next appointment:   2 month(s)  The format for your next appointment:   In Person  Provider:   Eilleen Grates, MD   Other Instructions

## 2023-12-21 ENCOUNTER — Telehealth (HOSPITAL_COMMUNITY): Payer: Self-pay | Admitting: Pharmacy Technician

## 2023-12-21 ENCOUNTER — Telehealth (HOSPITAL_COMMUNITY): Payer: Self-pay

## 2023-12-21 ENCOUNTER — Other Ambulatory Visit (HOSPITAL_COMMUNITY): Payer: Self-pay

## 2023-12-21 NOTE — Telephone Encounter (Signed)
 Tikosyn admission has been approved. Authorization # [id number]

## 2023-12-21 NOTE — Telephone Encounter (Signed)
 Patient Product/process development scientist completed.    The patient is insured through [employer]/ [employer]. Patient has Medicare and is not eligible for a copay card, but may be able to apply for patient assistance or Medicare RX Payment Plan (Patient Must reach out to their plan, if eligible for payment plan), if available.    Ran test claim for dofetilide (Tikosyn) 500 mcg and the current 30 day co-pay is $50.83.   This test claim was processed through [facility]- copay amounts may vary at other pharmacies due to pharmacy/plan contracts, or as the patient moves through the different stages of their insurance plan.     Roland Earl, CPHT Pharmacy Technician III Certified Patient Advocate [facility] Patient Advocate Team Direct Number: [phone]  Fax: [phone]

## 2023-12-21 NOTE — Telephone Encounter (Signed)
 Initated prior authorization to [employer] for Tikosyn admission. Date of service: 01/01/2024. Faxed clinical information to [employer]. They will reach out once authorization has been approved. Fax # [phone]

## 2023-12-24 ENCOUNTER — Telehealth: Payer: Self-pay

## 2023-12-24 ENCOUNTER — Telehealth: Payer: Self-pay | Admitting: Pharmacist

## 2023-12-24 DIAGNOSIS — J849 Interstitial pulmonary disease, unspecified: Secondary | ICD-10-CM

## 2023-12-24 NOTE — Telephone Encounter (Signed)
 Copied from CRM [id number]. Topic: Clinical - Prescription Issue >> Dec 24, 2023 11:15 AM Crist Dominion wrote: Reason for CRM: Patients wife is requesting Dr.Mannam to send a new prescription for Nintedanib  (OFEV ) 100 MG CAPS to the patients Pres[place]: [place] before they will refill the medication and patient has about a week of medication left.  Please advise.

## 2023-12-24 NOTE — Telephone Encounter (Signed)
 Medication list reviewed in anticipation of upcoming Tikosyn initiation. Patient is not taking any contraindicated or QTc prolonging medications.   Furosemide : please monitor electrolytes closely. Please ensure K is at least 4 and Mag at least 2 prior to admission. Ideally keep K between 4 and 5. Of note patient also on spironolactone  which can increase K.   Metformin : may increase serum concentration of Tikosyn. No changes needed. Monitor for s/sx of toxicity including QTc.   Patient is anticoagulated on Xarelto . With his latest labs, he would qualify for a lower dose using actually bodyweight. However, his typical scr is around 0.7-0.8. Would leave at Xarelto  20mg  daily for now. Please ensure that patient has not missed any anticoagulation doses in the 3 weeks prior to Tikosyn initiation.   Patient will need to be counseled to avoid use of Benadryl  while on Tikosyn and in the 2-3 days prior to Tikosyn initiation.  No amiodarone use.

## 2023-12-25 ENCOUNTER — Ambulatory Visit (HOSPITAL_COMMUNITY): Admitting: Internal Medicine

## 2023-12-25 MED ORDER — OFEV 100 MG PO CAPS: 100.0000 mg | ORAL_CAPSULE | Freq: Two times a day (BID) | ORAL | 1 refills | Status: DC

## 2023-12-25 NOTE — Telephone Encounter (Signed)
**Note De-identified  [place] Obfuscation** Please advise 

## 2023-12-25 NOTE — Telephone Encounter (Signed)
 Refill sent for OFEV  to [facility] (Ofev ): [phone]  Dose: 100mg  twice daily  Last OV: 12/10/2023 Provider: Dr. Waylan Haggard Pertinent labs: LFTs on 12/06/2023 wnl  Next OV: 03/19/2024  Geraldene Kleine, PharmD, MPH, BCPS Clinical Pharmacist (Rheumatology and Pulmonology)

## 2023-12-26 ENCOUNTER — Telehealth: Payer: Self-pay

## 2023-12-26 ENCOUNTER — Ambulatory Visit: Admitting: Dietician

## 2023-12-26 NOTE — Telephone Encounter (Signed)
 There is no message that came over

## 2023-12-26 NOTE — Progress Notes (Addendum)
 Referring Physician:  Shirline Dover, DO [address] [place]  [address] [place],  [place] [address]  Primary Physician:  Todd Dauphin, MD  History of Present Illness: 12/27/2023 [name] has a history of HTN, CAD, afib, PVD, DM, NSTEMI, chronic systolic heart failure, pulmonary fibrosis, asbestosis, GERD, L3 and T12 compression fractures, hyperlipidemia.   Saw neurology ([facility]) back on 10/05/23. Had MRIs of brain, cervical/lumbar spine. Did not feel he had Parkinson's.   He has intermittent left sided back and buttock pain x 5 months. He has no leg pain, but legs give way causing him to fall. No numbness or tingling. Pain is worse with walking and going from sitting to standing. Pain is better with sitting. Pain is worse in the morning and better as he moves around.   He has intermittent numbness and tingling in both hands. He drops things and notes weakness.   He takes chronic prednisone . He is on XARELTO .   He does not smoke.   Bowel/Bladder Dysfunction: none  Conservative measures:  Physical therapy: HHPT with [facility]- he's not sure this helps Multimodal medical therapy including regular antiinflammatories:  tylenol  Injections:  no epidural steroid injections  Past Surgery: no spinal surgeries  Todd [name] has no symptoms of cervical myelopathy.  The symptoms are causing a significant impact on the patient's life.   Review of Systems:  A 10 point review of systems is negative, except for the pertinent positives and negatives detailed in the HPI.  Past Medical History: Past Medical History:  Diagnosis Date   Allergic rhinitis    Asthma    Atrial fibrillation with rapid ventricular response (HCC)    a. newly diagnosed 11/03/13, spont conv to NSR, placed on xarelto .   CAD (coronary artery disease)    a. 1999; s/p CABG: LIMA to LAD, SVG to OM1, SVG to OM2, SVG to RCA  b. Normal nuc 10/2013 (done because of new onset AF.)   COVID    Diabetes mellitus (HCC)    HLD  (hyperlipidemia)    HTN (hypertension)    Nephrolithiasis    Overweight(278.02)    PVD (peripheral vascular disease) (HCC)    Carotid stenosis   Stroke [facility]     Past Surgical History: Past Surgical History:  Procedure Laterality Date   CARDIOVERSION N/A 11/12/2023   Procedure: CARDIOVERSION;  Surgeon: Luana Rumple, MD;  Location: [facility] INVASIVE CV LAB;  Service: Cardiovascular;  Laterality: N/A;   CAROTID ENDARTERECTOMY  1999   COLONOSCOPY  2014   negative;Dr Adan Holms   COLONOSCOPY W/ POLYPECTOMY  2009   Dr Adan Holms   CORONARY ARTERY BYPASS GRAFT  Aug.1999   HEMORRHOID BANDING     INTRACAPSULAR CATARACT EXTRACTION Bilateral 2018   LEFT HEART CATH AND CORS/GRAFTS ANGIOGRAPHY N/A 01/08/2023   Procedure: LEFT HEART CATH AND CORS/GRAFTS ANGIOGRAPHY;  Surgeon: Swazil[place], Peter M, MD;  Location: [facility] INVASIVE CV LAB;  Service: Cardiovascular;  Laterality: N/A;   NASAL SINUS SURGERY      Allergies: Allergies as of 12/27/2023 - Review Complete 12/20/2023  Allergen Reaction Noted   Amlodipine besylate     Iodinated contrast media Rash 06/22/2021   Metformin  and related Diarrhea 05/24/2023    Medications: Outpatient Encounter Medications as of 12/27/2023  Medication Sig   acetaminophen  (TYLENOL ) 650 MG CR tablet Take 650 mg by mouth every 8 (eight) hours as needed for pain.   alendronate  (FOSAMAX ) 70 MG tablet Take 1 tablet (70 mg total) by mouth every 7 (seven)  days. Take with a full glass of water on an empty stomach.   atorvastatin  (LIPITOR ) 80 MG tablet Take 1 tablet (80 mg total) by mouth daily. (Patient taking differently: Take 80 mg by mouth at bedtime.)   calcitonin, salmon, (MIACALCIN/FORTICAL) 200 UNIT/ACT nasal spray Place 1 spray into alternate nostrils daily.   cholecalciferol (VITAMIN D3) 25 MCG (1000 UNIT) tablet Take 1,000 Units by mouth daily.   Continuous Glucose Receiver (FREESTYLE LIBRE 3 READER) DEVI 1 each by Does not apply route 4 (four) times daily -  before meals  and at bedtime.   Continuous Glucose Receiver (FREESTYLE LIBRE 3 READER) DEVI USE AS DIRECTED FOR CONTINUOUS BLOOD GLUCOSE MONITORING (CHECK BEFORE MEALS AND AT BEDTIME AS DIRECTED)   Continuous Glucose Sensor (FREESTYLE LIBRE 3 SENSOR) MISC 1 each by Does not apply route 4 (four) times daily -  before meals and at bedtime. Place 1 sensor on the skin every 14 days. Use to check glucose continuously   Continuous Glucose Sensor (FREESTYLE LIBRE 3 SENSOR) MISC PLACE 1 SENSOR ON THE SKIN EVERY 14 DAYS. USE TO CHECK GLUCOSE CONTINUOUSLY BEFORE MEALS AND AT BEDTIME.   cyanocobalamin  (VITAMIN B12) 1000 MCG tablet Take 1,000 mcg by mouth daily.   dapagliflozin  propanediol ([name] ) 10 MG TABS tablet Take 1 tablet (10 mg total) by mouth daily before breakfast.   furosemide  (LASIX ) 40 MG tablet Take 1 tablet (40 mg total) by mouth daily.   GLOBAL EASE INJECT PEN NEEDLES 31G X 5 MM MISC USE TO INJECT INSULIN  3 TIMES DAILY. MAY DISPENSE ANY MANUFACTURER COVERED BY PATIENT'S INSURANCE.   insulin  aspart (NOVOLOG ) 100 UNIT/ML FlexPen Inject 4 Units into the skin 3 (three) times daily with meals. Only take if eating a meal AND Blood Glucose (BG) is 80 or higher. (Patient taking differently: Inject 4 Units into the skin 3 (three) times daily as needed for high blood sugar (Only take if eating a meal AND Blood Glucose (BG) is 80 or higher.).)   Insulin  Pen Needle (PEN NEEDLES) 32G X 5 MM MISC UAD for daily lantus  injection   ipratropium (ATROVENT ) 0.06 % nasal spray Place 2-4 sprays into both nostrils every 8 (eight) hours as needed for rhinitis.   metFORMIN  (GLUCOPHAGE -XR) 500 MG 24 hr tablet Take 1 tablet (500 mg total) by mouth 2 (two) times daily with a meal.   metoprolol  tartrate (LOPRESSOR ) 100 MG tablet TAKE 1 TABLET BY MOUTH 2 TIMES DAILY.   Multiple Vitamin (MULTIVITAMIN PO) Take 1 tablet by mouth daily.   Nintedanib  (OFEV ) 100 MG CAPS Take 1 capsule (100 mg total) by mouth 2 (two) times daily.   nitroGLYCERIN   (NITROSTAT ) 0.4 MG SL tablet Place 1 tablet (0.4 mg total) under the tongue every 5 (five) minutes as needed for chest pain.   Omega-3 Fatty Acids (FISH OIL PO) Take 1,030 mg by mouth 2 (two) times daily.   omeprazole  (PRILOSEC) 40 MG capsule TAKE 1 CAPSULE BY MOUTH 2 TIMES DAILY.   ONETOUCH DELICA LANCETS 33G MISC TEST BLOOD SUGAR ONCE DAILY   ONETOUCH VERIO test strip USE UP TO 4 TIMES A DAY AS DIRECTED   PIP PEN NEEDLES 31G X 31G X 5 MM MISC USE TO INJECT INSULIN  3 TIMES DAILY. MAY DISPENSE ANY MANUFACTURER COVERED BY PATIENT''S INSURANCE.   predniSONE  (DELTASONE ) 10 MG tablet TAKE 1 TABLET (10 MG TOTAL) BY MOUTH DAILY WITH BREAKFAST.   rivaroxaban  (XARELTO ) 20 MG TABS tablet TAKE 1 TABLET (20 MG TOTAL) BY MOUTH DAILY WITH  SUPPER.   spironolactone  (ALDACTONE ) 25 MG tablet Take 0.5 tablets (12.5 mg total) by mouth daily.   No facility-administered encounter medications on file as of 12/27/2023.    Social History: Social History   Tobacco Use   Smoking status: Never   Smokeless tobacco: Never  Vaping Use   Vaping status: Never Used  Substance Use Topics   Alcohol use: No    Alcohol/week: 0.0 standard drinks of alcohol   Drug use: No    Family Medical History: Family History  Problem Relation Age of Onset   Heart attack Mother 59   Diabetes Father        IDDM   Stroke Father 6   Asthma Sister    Lupus Sister    Fibromyalgia Sister    Fibromyalgia Sister    Prostate cancer Brother    Hemochromatosis Brother    Colon cancer Brother 72   Heart attack Brother 80   Heart attack Brother 68   Esophageal cancer Neg Hx    Rectal cancer Neg Hx    Stomach cancer Neg Hx     Physical Examination: There were no vitals filed for this visit.  General: Patient is well developed, well nourished, calm, collected, and in no apparent distress. Attention to examination is appropriate.  Respiratory: Patient is breathing without any difficulty.   NEUROLOGICAL:     Awake, alert,  oriented to person, place, and time.  Speech is clear and fluent. Fund of knowledge is appropriate.   Cranial Nerves: Pupils equal round and reactive to light.  Facial tone is symmetric.    No posterior cervical tenderness.   Mild tenderness at TL junction.   No abnormal lesions on exposed skin.   Strength: Side Biceps Triceps Deltoid Interossei Grip Wrist Ext. Wrist Flex.  R 5 5 5 5 5 5 5   L 5 5 5 5 5 5 5    Side Iliopsoas Quads Hamstring PF DF EHL  R 4 5 5 5 5 5   L 5 5 5 5 5 5    No other gross weakness in right leg, but he is slower on strength testing on right than left.   Reflexes are 1+ and symmetric at the biceps, brachioradialis, patella and achilles.   Hoffman's is absent.  Clonus is not present.   Bilateral upper and lower extremity sensation is intact to light touch, but diminished right lateral thigh.   He ambulates with a rollator and hunches over when he walks.   Medical Decision Making  Imaging: Lumbar xrays dated 12/06/23:  FINDINGS: There is barium throughout the colon which partially obscures the lumbar spine, especially on the lateral view. There are 5 lumbar type vertebral bodies. The alignment is grossly stable with a stepwise retrolisthesis at L2-3, L3-4 and L4-5. Mild chronic superior endplate compression deformity at L3 appears unchanged. Apparent new anterior compression deformity at T12, partially obscured by the barium, but resulting in approximately 35% loss of vertebral body height. Multilevel spondylosis noted.   IMPRESSION: 1. Apparent new anterior compression deformity at T12, partially obscured by barium throughout the colon. Correlate with any focal pain and consider further evaluation with thoracic spine MRI. This appears new from recent lumbar MRI. 2. Stable chronic superior endplate compression deformity at L3. 3. Multilevel spondylosis.   Electronically Signed: By: Elmon Hagedorn M.D. On: 12/06/2023 11:35  ADDENDUM REPORT:  12/06/2023 11:50   ADDENDUM: On further review of the lumbar MRI from 11/13/2023, there is evidence of a mild inferior endplate compression  deformity on that examination. This now appears progressive from the MRI, although suboptimally evaluated on the current examination due to overlying barium.     Electronically Signed   By: Elmon Hagedorn M.D.   On: 12/06/2023 11:50   Cervical and Lumbar MRI dated 11/13/23:  MRI CERVICAL SPINE FINDINGS   Alignment: Straightening without focal angulation or significant listhesis.   Vertebrae: No acute or suspicious osseous findings.   Cord: Normal in signal and caliber.   Posterior Fossa, vertebral arteries, paraspinal tissues: Small vessel ischemic changes within the pons. Intracranial details dictated separately. Bilateral vertebral artery flow voids. No significant paraspinal findings are seen.   Disc levels:   C2-3: Mild bilateral facet hypertrophy. No significant spinal stenosis or significant foraminal narrowing.   C3-4: Mild disc bulging with asymmetric uncinate spurring and facet hypertrophy on the left. A small central disc protrusion contacts the ventral surface of the cord. No cord deformity. Mild to moderate foraminal narrowing on the left.   C4-5: Preserved disc height with mild disc bulging, a small central disc protrusion and bilateral uncinate spurring. Asymmetric facet hypertrophy on the left. No significant central spinal stenosis. Mild foraminal narrowing bilaterally.   C5-6: Moderate loss of disc height with posterior osteophytes covering diffusely bulging disc material. Resulting mild spinal stenosis without significant cord deformity. Moderate to severe right and moderate left foraminal narrowing.   C6-7: Spondylosis with moderate loss of disc height and posterior osteophytes covering diffusely bulging disc material. Mild spinal stenosis without significant cord deformity. Moderate foraminal narrowing  bilaterally.   C7-T1: No significant findings.   MRI LUMBAR SPINE FINDINGS   As above, this portion of the examination is moderately motion degraded.   Segmentation: Conventional anatomy assumed, with the last open disc space designated L5-S1.   Alignment: Straightening without focal angulation or significant listhesis.   Vertebrae: Mild chronic superior endplate compression deformity at L3 appears unchanged from prior radiographs, without residual marrow edema. Minimal inferior compression deformity at T12 with less than 10% loss of vertebral body height. There is mild marrow edema. No other acute osseous findings. No evidence of pars defect or aggressive osseous lesion.   Conus medullaris: Extends to the L1-2 level and appears normal.   Paraspinal and other soft tissues: No significant paraspinal findings.   Disc levels:   L1-2: No significant abnormality of the disc. Mild bilateral facet hypertrophy. No significant spinal stenosis or nerve root encroachment.   L2-3: Mild disc bulging with endplate osteophytes asymmetric to the left. Moderate bilateral facet hypertrophy. Resulting mild spinal stenosis with mild asymmetric narrowing of the left lateral recess and left foramen.   L3-4: Mild loss of disc height with annular disc bulging and endplate osteophytes. Moderate facet and ligamentous hypertrophy. Resulting moderate multifactorial spinal stenosis with mild lateral recess and foraminal narrowing bilaterally.   L4-5: Mild loss of disc height with mild annular disc bulging and mild facet hypertrophy. Mild spinal stenosis with mild lateral recess and foraminal narrowing bilaterally.   L5-S1: Mild disc bulging with endplate osteophytes asymmetric to the left. Mild bilateral facet hypertrophy. No significant spinal stenosis or lateral recess narrowing. There is mild right foraminal narrowing.   IMPRESSION: 1. Mild acute or subacute inferior compression deformity  at T12 with less than 10% loss of vertebral body height. 2. No other acute osseous findings are seen in the cervical or lumbar spine. Chronic superior endplate compression deformity at L3. 3. 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 There is  also moderate to severe right and moderate left foraminal narrowing at C5-6 and moderate foraminal narrowing bilaterally at C6-7. 4. Multifactorial moderate spinal stenosis at L3-4 with mild lateral recess and foraminal narrowing bilaterally. 5. Mild spinal stenosis at L2-3 and L4-5.     Electronically Signed   By: Elmon Hagedorn M.D.   On: 12/06/2023 11:48  I have personally reviewed the images and agree with the above interpretation.  Assessment and Plan: Mr. Sentman has 5 month history of intermittent left sided back and buttock pain. He has no leg pain, but legs give way causing him to fall. His mobility has gotten much worse in last 6 months.   Lumbar xrays show progression of T12 fracture from lumbar MRI in March. He has old L3 compression fracture as well ast mild central stenosis L2-L3 and L4-L5 with moderate central stenosis L5-S1 and multilevel foraminal stenosis.   Cervical MRI shows mild central stenosis C5-C7 with cervical spondylosis and multilevel foraminal stenosis.   He has 4/5 strength in right iliopsoas, but no other gross weakness on exam.   Treatment options discussed with patient and following plan made:   - MRI of thoracic spine to further evaluate compression fracture at T12.  - Discussed back brace. Will hold off for now as pain is manageable and brace may adversely affect breathing (has pulmonary fibrosis).  - I am not convinced his leg weakness is spine mediated. Will review with Dr. Felipe Horton and message him.  - Recommend follow up with neurology. Message to Dr. Winferd Hatter. May consider LE EMG? - Family to discuss bone density/DEXA with PCP.  - Follow up with me in clinic to review  MRI results once I have them back.   I spent a total of 40 minutes in face-to-face and non-face-to-face activities related to this patient's care today including review of outside records, review of imaging, review of symptoms, physical exam, discussion of differential diagnosis, discussion of treatment options, and documentation.   Thank you for involving me in the care of this patient.   ADDENDUM 12/28/23:  Reviewed with Dr. Felipe Horton. He does not see any current spine mediated cause of weakness in his legs and he agrees with getting thoracic MRI to look into things further. Patient sent a message.   Lucetta Russel PA-C Dept. of Neurosurgery

## 2023-12-26 NOTE — Telephone Encounter (Signed)
 This patient see Dr. Donnette Gal and message was sent to endocrinology but not our patient.

## 2023-12-27 ENCOUNTER — Encounter: Payer: Self-pay | Admitting: Orthopedic Surgery

## 2023-12-27 ENCOUNTER — Encounter: Payer: Self-pay | Admitting: Internal Medicine

## 2023-12-27 ENCOUNTER — Ambulatory Visit: Admitting: Orthopedic Surgery

## 2023-12-27 VITALS — BP 108/72 | Ht 68.0 in | Wt 127.0 lb

## 2023-12-27 DIAGNOSIS — M48061 Spinal stenosis, lumbar region without neurogenic claudication: Secondary | ICD-10-CM

## 2023-12-27 DIAGNOSIS — M545 Low back pain, unspecified: Secondary | ICD-10-CM

## 2023-12-27 DIAGNOSIS — M47812 Spondylosis without myelopathy or radiculopathy, cervical region: Secondary | ICD-10-CM

## 2023-12-27 DIAGNOSIS — M47816 Spondylosis without myelopathy or radiculopathy, lumbar region: Secondary | ICD-10-CM | POA: Diagnosis not present

## 2023-12-27 DIAGNOSIS — S22080A Wedge compression fracture of T11-T12 vertebra, initial encounter for closed fracture: Secondary | ICD-10-CM | POA: Diagnosis not present

## 2023-12-27 NOTE — Patient Instructions (Signed)
 It was so nice to see you today. Thank you so much for coming in.    Looks like you have a new compression fracture/broken bone at T12. This may be causing your increased back pain.  I want to get an MRI of your mid back to look into things further. We will get this approved through your insurance and [facility] will call you to schedule the appointment. Ask about your patient responsibility. You do not need to pay this prior to getting MRI, they can bill you.   After you have the MRI, it takes 14-21 days for me to get the results back. Once I have them, we will call you to schedule a follow up visit with me to review them. Call or message me if you don't hear anything after 1-2 weeks.   No bending, twisting, or lifting.   I sent Dr. Winferd Hatter a message and they should be calling you for follow up.   Continue to use your rollator walker.   May need to get a bone density (DEXA), I would discuss with your PCP.   Please do not hesitate to call if you have any questions or concerns. You can also message me in MyChart.   If you have not heard back about any of the above things in the next week, please call the office so we can help you get them scheduled.   Lucetta Russel PA-C [phone]     The physicians and staff at [facility] at [facility] are committed to providing excellent care. You may receive a survey asking for feedback about your experience at our office. We value you your feedback and appreciate you taking the time to to fill it out. The [facility] leadership team is also available to discuss your experience in person, feel free to contact us  [phone].

## 2023-12-28 ENCOUNTER — Encounter (HOSPITAL_COMMUNITY): Payer: Self-pay

## 2023-12-28 ENCOUNTER — Other Ambulatory Visit: Payer: Self-pay

## 2023-12-28 ENCOUNTER — Ambulatory Visit: Payer: PPO | Admitting: Neurology

## 2023-12-28 MED ORDER — ATORVASTATIN CALCIUM 80 MG PO TABS: 80.0000 mg | ORAL_TABLET | Freq: Every day | ORAL | 3 refills | Status: DC

## 2023-12-31 ENCOUNTER — Ambulatory Visit: Payer: Self-pay

## 2023-12-31 NOTE — Telephone Encounter (Signed)
 10 units daily and then we adjust from there.

## 2023-12-31 NOTE — Telephone Encounter (Signed)
 Do they still have the once  a day long acting insulin  he was on -- if so she can give him 10 units of that which will help lower the sugars.  Continue the novolog  insulin  as needed with meals.

## 2023-12-31 NOTE — Telephone Encounter (Signed)
 Copied from CRM [id number]. Topic: Clinical - Red Word Triage >> Dec 31, 2023  8:59 AM Marlan Silva wrote: Red Word that prompted transfer to Nurse Triage: Patient is experiencing high glucose readings 196mg /dL, yesterday it was higher over 200. Patients wife Reymond Barrus is on the phone speaking on behalf of the patient.   Chief Complaint: high BG Symptoms: less urination Frequency: constant Pertinent Negatives: Patient denies shortness of breath Disposition: [] ED /[] Urgent Care (no appt availability in office) / [] Appointment(In office/virtual)/ []  [facility]/ [] Home Care/ [] Refused Recommended Disposition /[] [place] [facility]/ []  Follow-up with PCP Additional Notes: Per wife, pt was advised by [facility] to hold [name] . Pt is going to hospital  tomorrow for new medication for Afib,  is to remain off of the medication until he is discharged from hospital this Saturday. Wife is concerned because BG levels have been fluctuating sinc last night. She reports over 200 last night and this morning fasting BG is 196.  Wife seeking recommendations to maintain BG while off of the [name]  fornow.  Reason for Disposition  Blood glucose 70-240 mg/dL (3.9 -04.5 mmol/L)  Answer Assessment - Initial Assessment Questions 1. BLOOD GLUCOSE: "What is your blood glucose level?"      196  2. ONSET: "When did you check the blood glucose?"     This morning  3. USUAL RANGE: "What is your glucose level usually?" (e.g., usual fasting morning value, usual evening value)     70-130  4. KETONES: "Do you check for ketones (urine or blood test strips)?" If Yes, ask: "What does the test show now?"      No  5. TYPE 1 or 2:  "Do you know what type of diabetes you have?"  (e.g., Type 1, Type 2, Gestational; doesn't know)      Type 2  6. INSULIN : "Do you take insulin ?" "What type of insulin (s) do you use? What is the mode of delivery? (syringe, pen; injection or pump)?"      Novolog   7. DIABETES PILLS:  "Do you take any pills for your diabetes?" If Yes, ask: "Have you missed taking any pills recently?"     Metformin  and farxiga   8. OTHER SYMPTOMS: "Do you have any symptoms?" (e.g., fever, frequent urination, difficulty breathing, dizziness, weakness, vomiting)     Not urinating as much  Protocols used: Diabetes - High Blood Sugar-A-AH

## 2023-12-31 NOTE — Telephone Encounter (Signed)
 Spoke with Todd [name] today.  Regarding the lantus  (they still had the pen) do you want them to do the 10 units daily or gauge it based on glucose readings?

## 2024-01-01 ENCOUNTER — Inpatient Hospital Stay (HOSPITAL_COMMUNITY)
Admission: AD | Admit: 2024-01-01 | Discharge: 2024-01-04 | DRG: 308 | Disposition: A | Discharge status: Home / Self Care | Source: Ambulatory Visit | Attending: Cardiology | Admitting: Cardiology

## 2024-01-01 ENCOUNTER — Other Ambulatory Visit: Payer: Self-pay

## 2024-01-01 ENCOUNTER — Encounter (HOSPITAL_COMMUNITY): Payer: Self-pay | Admitting: Cardiovascular Disease

## 2024-01-01 ENCOUNTER — Ambulatory Visit (HOSPITAL_COMMUNITY)
Admission: RE | Admit: 2024-01-01 | Discharge: 2024-01-01 | Disposition: A | Discharge status: Home / Self Care | Source: Ambulatory Visit | Attending: Internal Medicine | Admitting: Internal Medicine

## 2024-01-01 VITALS — BP 132/90 | HR 102 | Ht 68.0 in | Wt 131.0 lb

## 2024-01-01 DIAGNOSIS — I4819 Other persistent atrial fibrillation: Principal | ICD-10-CM | POA: Diagnosis present

## 2024-01-01 DIAGNOSIS — Z7952 Long term (current) use of systemic steroids: Secondary | ICD-10-CM | POA: Diagnosis not present

## 2024-01-01 DIAGNOSIS — I110 Hypertensive heart disease with heart failure: Secondary | ICD-10-CM | POA: Diagnosis not present

## 2024-01-01 DIAGNOSIS — I5023 Acute on chronic systolic (congestive) heart failure: Secondary | ICD-10-CM | POA: Diagnosis present

## 2024-01-01 DIAGNOSIS — K047 Periapical abscess without sinus: Secondary | ICD-10-CM | POA: Diagnosis not present

## 2024-01-01 DIAGNOSIS — I4891 Unspecified atrial fibrillation: Secondary | ICD-10-CM | POA: Diagnosis not present

## 2024-01-01 DIAGNOSIS — I10 Essential (primary) hypertension: Secondary | ICD-10-CM | POA: Diagnosis not present

## 2024-01-01 DIAGNOSIS — R609 Edema, unspecified: Secondary | ICD-10-CM | POA: Diagnosis not present

## 2024-01-01 DIAGNOSIS — E119 Type 2 diabetes mellitus without complications: Secondary | ICD-10-CM | POA: Diagnosis present

## 2024-01-01 DIAGNOSIS — I2510 Atherosclerotic heart disease of native coronary artery without angina pectoris: Secondary | ICD-10-CM | POA: Diagnosis not present

## 2024-01-01 DIAGNOSIS — Z7901 Long term (current) use of anticoagulants: Secondary | ICD-10-CM

## 2024-01-01 DIAGNOSIS — D6869 Other thrombophilia: Secondary | ICD-10-CM | POA: Diagnosis present

## 2024-01-01 DIAGNOSIS — E785 Hyperlipidemia, unspecified: Secondary | ICD-10-CM | POA: Diagnosis not present

## 2024-01-01 DIAGNOSIS — Z951 Presence of aortocoronary bypass graft: Secondary | ICD-10-CM | POA: Diagnosis not present

## 2024-01-01 DIAGNOSIS — I5020 Unspecified systolic (congestive) heart failure: Secondary | ICD-10-CM | POA: Diagnosis not present

## 2024-01-01 LAB — GLUCOSE, CAPILLARY
Glucose-Capillary: 402 mg/dL — ABNORMAL HIGH (ref 70–99)
Glucose-Capillary: 332 mg/dL — ABNORMAL HIGH (ref 70–99)
Glucose-Capillary: 125 mg/dL — ABNORMAL HIGH (ref 70–99)

## 2024-01-01 LAB — DIGOXIN LEVEL: Digoxin Level: <0.4 ng/mL — ABNORMAL LOW (ref 0.8–2.0)

## 2024-01-01 LAB — MAGNESIUM
Magnesium: 1.5 mg/dL — ABNORMAL LOW (ref 1.6–2.3)
Magnesium: 2.6 mg/dL — ABNORMAL HIGH (ref 1.7–2.4)

## 2024-01-01 LAB — BASIC METABOLIC PANEL WITH GFR
BUN/Creatinine Ratio: 46 — ABNORMAL HIGH (ref 10–24)
BUN: 36 mg/dL — ABNORMAL HIGH (ref 8–27)
CO2: 27 mmol/L (ref 20–29)
Calcium: 8.8 mg/dL (ref 8.6–10.2)
Chloride: 103 mmol/L (ref 96–106)
Creatinine, Ser: 0.78 mg/dL (ref 0.76–1.27)
Glucose: 304 mg/dL — ABNORMAL HIGH (ref 70–99)
Potassium: 5 mmol/L (ref 3.5–5.2)
Sodium: 138 mmol/L (ref 134–144)
eGFR: 90 mL/min/{1.73_m2} (ref >59)

## 2024-01-01 MED ORDER — INSULIN ASPART 100 UNIT/ML IJ SOLN: 0.0000 [IU] | Freq: Every day | INTRAMUSCULAR | Status: DC

## 2024-01-01 MED ORDER — INSULIN ASPART 100 UNIT/ML IJ SOLN
0.0000 [IU] | Freq: Three times a day (TID) | INTRAMUSCULAR | Status: DC
  Administered 2024-01-01: 11 [IU] via SUBCUTANEOUS
  Administered 2024-01-02: 5 [IU] via SUBCUTANEOUS
  Administered 2024-01-02 (×2): 2 [IU] via SUBCUTANEOUS
  Administered 2024-01-03: 5 [IU] via SUBCUTANEOUS
  Administered 2024-01-03: 8 [IU] via SUBCUTANEOUS
  Administered 2024-01-04: 5 [IU] via SUBCUTANEOUS

## 2024-01-01 MED ORDER — ACETAMINOPHEN ER 650 MG PO TBCR: 650.0000 mg | EXTENDED_RELEASE_TABLET | ORAL | Status: DC | PRN

## 2024-01-01 MED ORDER — INSULIN ASPART 100 UNIT/ML IJ SOLN
4.0000 [IU] | Freq: Three times a day (TID) | INTRAMUSCULAR | Status: DC
  Administered 2024-01-01 – 2024-01-03 (×6): 4 [IU] via SUBCUTANEOUS

## 2024-01-01 MED ORDER — SODIUM CHLORIDE 0.9% FLUSH
3.0000 mL | Freq: Two times a day (BID) | INTRAVENOUS | Status: DC
  Administered 2024-01-01 – 2024-01-04 (×7): 3 mL via INTRAVENOUS

## 2024-01-01 MED ORDER — INSULIN ASPART 100 UNIT/ML FLEXPEN
4.0000 [IU] | PEN_INJECTOR | Freq: Three times a day (TID) | SUBCUTANEOUS | Status: DC | PRN
Start: 2024-01-01 — End: 2024-01-01

## 2024-01-01 MED ORDER — AMOXICILLIN 500 MG PO CAPS
500.0000 mg | ORAL_CAPSULE | Freq: Three times a day (TID) | ORAL | Status: DC
  Administered 2024-01-01 – 2024-01-04 (×8): 500 mg via ORAL
  Filled 2024-01-01 (×11): qty 1

## 2024-01-01 MED ORDER — FUROSEMIDE 40 MG PO TABS
40.0000 mg | ORAL_TABLET | Freq: Every day | ORAL | Status: DC
  Administered 2024-01-02 – 2024-01-04 (×3): 40 mg via ORAL
  Filled 2024-01-01 (×3): qty 1

## 2024-01-01 MED ORDER — SODIUM CHLORIDE 0.9 % IV SOLN: 250.0000 mL | INTRAVENOUS | Status: AC | PRN

## 2024-01-01 MED ORDER — INSULIN GLARGINE-YFGN 100 UNIT/ML ~~LOC~~ SOLN
10.0000 [IU] | Freq: Every day | SUBCUTANEOUS | Status: DC
  Administered 2024-01-01 – 2024-01-04 (×4): 10 [IU] via SUBCUTANEOUS
  Filled 2024-01-01 (×4): qty 0.1

## 2024-01-01 MED ORDER — ACETAMINOPHEN 325 MG PO TABS: 650.0000 mg | ORAL_TABLET | Freq: Four times a day (QID) | ORAL | Status: DC | PRN

## 2024-01-01 MED ORDER — NINTEDANIB ESYLATE 100 MG PO CAPS: 100.0000 mg | ORAL_CAPSULE | Freq: Two times a day (BID) | ORAL | Status: DC

## 2024-01-01 MED ORDER — MAGNESIUM SULFATE 4 GM/100ML IV SOLN
4.0000 g | INTRAVENOUS | Status: AC
  Administered 2024-01-01: 4 g via INTRAVENOUS
  Filled 2024-01-01: qty 100

## 2024-01-01 MED ORDER — DOFETILIDE 250 MCG PO CAPS
250.0000 ug | ORAL_CAPSULE | Freq: Two times a day (BID) | ORAL | Status: DC
  Administered 2024-01-01 – 2024-01-04 (×6): 250 ug via ORAL
  Filled 2024-01-01 (×6): qty 1

## 2024-01-01 MED ORDER — FAMOTIDINE 20 MG PO TABS
20.0000 mg | ORAL_TABLET | Freq: Every day | ORAL | Status: DC
  Administered 2024-01-01 – 2024-01-03 (×3): 20 mg via ORAL
  Filled 2024-01-01 (×3): qty 1

## 2024-01-01 MED ORDER — PREDNISONE 5 MG PO TABS
5.0000 mg | ORAL_TABLET | Freq: Every day | ORAL | Status: DC
  Administered 2024-01-02 – 2024-01-04 (×3): 5 mg via ORAL
  Filled 2024-01-01 (×3): qty 1

## 2024-01-01 MED ORDER — SPIRONOLACTONE 12.5 MG HALF TABLET
12.5000 mg | ORAL_TABLET | Freq: Every day | ORAL | Status: DC
  Administered 2024-01-02 – 2024-01-04 (×3): 12.5 mg via ORAL
  Filled 2024-01-01 (×3): qty 1

## 2024-01-01 MED ORDER — PANTOPRAZOLE SODIUM 40 MG PO TBEC
80.0000 mg | DELAYED_RELEASE_TABLET | Freq: Every day | ORAL | Status: DC
  Administered 2024-01-02 – 2024-01-04 (×3): 80 mg via ORAL
  Filled 2024-01-01 (×3): qty 2

## 2024-01-01 MED ORDER — SODIUM CHLORIDE 0.9% FLUSH: 3.0000 mL | INTRAVENOUS | Status: DC | PRN

## 2024-01-01 MED ORDER — METOPROLOL TARTRATE 100 MG PO TABS
100.0000 mg | ORAL_TABLET | Freq: Two times a day (BID) | ORAL | Status: DC
Start: 2024-01-01 — End: 2024-01-04
  Administered 2024-01-01 – 2024-01-04 (×6): 100 mg via ORAL
  Filled 2024-01-01 (×6): qty 1

## 2024-01-01 MED ORDER — RIVAROXABAN 20 MG PO TABS
20.0000 mg | ORAL_TABLET | Freq: Every day | ORAL | Status: DC
  Administered 2024-01-01 – 2024-01-03 (×3): 20 mg via ORAL
  Filled 2024-01-01 (×3): qty 1

## 2024-01-01 MED ORDER — ATORVASTATIN CALCIUM 80 MG PO TABS
80.0000 mg | ORAL_TABLET | Freq: Every day | ORAL | Status: DC
  Administered 2024-01-01 – 2024-01-03 (×3): 80 mg via ORAL
  Filled 2024-01-01 (×3): qty 1

## 2024-01-01 MED ORDER — FUROSEMIDE 10 MG/ML IJ SOLN
40.0000 mg | Freq: Once | INTRAMUSCULAR | Status: AC
  Administered 2024-01-01: 40 mg via INTRAVENOUS
  Filled 2024-01-01: qty 4

## 2024-01-01 MED ORDER — INSULIN GLARGINE-YFGN 100 UNIT/ML ~~LOC~~ SOLN: 10.0000 [IU] | Freq: Every day | SUBCUTANEOUS | Status: DC

## 2024-01-01 MED ORDER — METFORMIN HCL ER 500 MG PO TB24
500.0000 mg | ORAL_TABLET | Freq: Two times a day (BID) | ORAL | Status: DC
  Administered 2024-01-01 – 2024-01-04 (×6): 500 mg via ORAL
  Filled 2024-01-01 (×7): qty 1

## 2024-01-01 NOTE — Plan of Care (Signed)
  Problem: Education: Goal: Knowledge of General Education information will improve Description: Including pain rating scale, medication(s)/side effects and non-pharmacologic comfort measures Outcome: Progressing   Problem: Health Behavior/Discharge Planning: Goal: Ability to manage health-related needs will improve Outcome: Progressing   Problem: Clinical Measurements: Goal: Ability to maintain clinical measurements within normal limits will improve Outcome: Progressing Goal: Will remain free from infection Outcome: Progressing Goal: Diagnostic test results will improve Outcome: Progressing Goal: Respiratory complications will improve Outcome: Progressing Goal: Cardiovascular complication will be avoided Outcome: Progressing   Problem: Activity: Goal: Risk for activity intolerance will decrease Outcome: Progressing   Problem: Nutrition: Goal: Adequate nutrition will be maintained Outcome: Progressing   Problem: Coping: Goal: Level of anxiety will decrease Outcome: Progressing   Problem: Elimination: Goal: Will not experience complications related to bowel motility Outcome: Progressing Goal: Will not experience complications related to urinary retention Outcome: Progressing   Problem: Pain Managment: Goal: General experience of comfort will improve and/or be controlled Outcome: Progressing   Problem: Safety: Goal: Ability to remain free from injury will improve Outcome: Progressing   Problem: Skin Integrity: Goal: Risk for impaired skin integrity will decrease Outcome: Progressing   Problem: Education: Goal: Knowledge of disease or condition will improve Outcome: Progressing Goal: Understanding of medication regimen will improve Outcome: Progressing Goal: Individualized Educational Video(s) Outcome: Progressing   Problem: Activity: Goal: Ability to tolerate increased activity will improve Outcome: Progressing   Problem: Cardiac: Goal: Ability to achieve  and maintain adequate cardiopulmonary perfusion will improve Outcome: Progressing   Problem: Health Behavior/Discharge Planning: Goal: Ability to safely manage health-related needs after discharge will improve Outcome: Progressing   Problem: Education: Goal: Ability to describe self-care measures that may prevent or decrease complications (Diabetes Survival Skills Education) will improve Outcome: Progressing Goal: Individualized Educational Video(s) Outcome: Progressing   Problem: Coping: Goal: Ability to adjust to condition or change in health will improve Outcome: Progressing   Problem: Fluid Volume: Goal: Ability to maintain a balanced intake and output will improve Outcome: Progressing   Problem: Health Behavior/Discharge Planning: Goal: Ability to identify and utilize available resources and services will improve Outcome: Progressing Goal: Ability to manage health-related needs will improve Outcome: Progressing   Problem: Metabolic: Goal: Ability to maintain appropriate glucose levels will improve Outcome: Progressing   Problem: Nutritional: Goal: Maintenance of adequate nutrition will improve Outcome: Progressing Goal: Progress toward achieving an optimal weight will improve Outcome: Progressing   Problem: Skin Integrity: Goal: Risk for impaired skin integrity will decrease Outcome: Progressing   Problem: Tissue Perfusion: Goal: Adequacy of tissue perfusion will improve Outcome: Progressing

## 2024-01-01 NOTE — Progress Notes (Signed)
 Pharmacy: Dofetilide (Tikosyn) - Initial Consult Assessment and Electrolyte Replacement  Pharmacy consulted to assist in monitoring and replacing electrolytes in this 83 y.o. male admitted on 01/01/2024 undergoing dofetilide initiation. First dofetilide dose: 250 mcg BID to start 5/13 at 8 pm.  Assessment:  Patient Exclusion Criteria: If any screening criteria checked as "Yes", then  patient  should NOT receive dofetilide until criteria item is corrected.  If "Yes" please indicate correction plan.  YES  NO Patient  Exclusion Criteria Correction Plan/Comments   []   [x]   Baseline QTc interval is greater than or equal to 440 msec. IF above YES box checked dofetilide contraindicated unless patient has ICD; then may proceed if QTc 500-550 msec or with known ventricular conduction abnormalities may proceed with QTc 550-600 msec. QTc = 432    [x]   []   Patient is known or suspected to have a digoxin  level greater than 2 ng/ml: Lab Results  Component Value Date   DIGOXIN  2.3 (H) 11/15/2023    Pt taken off of dig, repeating level this evening.   []   [x]   Creatinine clearance less than 20 ml/min (calculated using Cockcroft-Gault, actual body weight and serum creatinine): Estimated Creatinine Clearance: 62.3 mL/min (by C-G formula based on SCr of 0.78 mg/dL).     []   [x]  Patient has received drugs known to prolong the QT intervals within the last 48 hour (examples: phenothiazines, tricyclics or tetracyclic antidepressants, macrolides, 1st generation H-1 antihistamines (especially diphenhydramine ), fluoroquinolones, azoles, ondansetron , metoclopramide, promethazine).   Updated information on QT prolonging agents is available to be searched on the following database:QT prolonging agents -If SSRI or antihistamine needed, preferred options are sertraline and loratadine respectively     []   [x]  Patient received a dose of a thiazide diuretic in the last 48 hours [including hydrochlorothiazide  (Oretic) alone or in any combination including triamterene (Dyazide, Maxzide)].    []   [x]  Patient received a medication known to increase dofetilide plasma concentrations prior to initial dofetilide dose:  Trimethoprim (Primsol, Proloprim) in the last 36 hours Verapamil  (Calan , Verelan ) in the last 36 hours or a sustained release dose in the last 72 hours Megestrol (Megace) in the last 5 days  Cimetidine (Tagamet) in the last 6 hours Ketoconazole (Nizoral) in the last 24 hours Itraconazole (Sporanox) in the last 48 hours  Prochlorperazine (Compazine) in the last 36 hours     []   [x]   Patient is known to have a history of torsades de pointes; congenital or acquired long QT syndromes.    []   [x]   Patient has received a Class 1 and Class 3 antiarrhythmic with less than 2 half-lives since last dose. (Disopyramide, Quinidine, Procainamide, Lidocaine , Mexiletine, Flecainide, Propafenone, Sotalol, Dronedarone)    []   [x]   Patient has received amiodarone therapy in the past 3 months or amiodarone level is greater than 0.3 ng/ml.    Labs:    Component Value Date/Time   K 5.0 01/01/2024 0924   MG 1.5 (L) 01/01/2024 0924     Plan: Select One Calculated CrCl  Dose q12h  []  > 60 ml/min 500 mcg  [x]  40-60 ml/min 250 mcg  []  20-40 ml/min 125 mcg   [x]   Physician selected initial dose within range recommended for patients level of renal function - will monitor for response.  []   Physician selected initial dose outside of range recommended for patients level of renal function - will discuss if the dose should be altered at this time.   Patient has been appropriately  anticoagulated with Xarelto .  Potassium: K >/= 4: Appropriate to initiate Tikosyn, no replacement needed    Magnesium : Mg 1.3-1.7: Hold Tikosyn initiation and give Mg 4 gm x1 IV, recheck Mg 4hr after end of infusion - repeat appropriate dose if not > 1.8     Thank you for allowing pharmacy to participate in this  patient's care   Joanell Mowers, Davey Erp, [facility] Clinical Pharmacist  01/01/2024 2:30 PM   [facility] pharmacy phone numbers are listed on [email or web address]

## 2024-01-01 NOTE — Progress Notes (Addendum)
 Primary Care Physician: Colene Dauphin, MD Primary Cardiologist: Eilleen Grates, MD Electrophysiologist: Boyce Byes, MD     Referring Physician: Dr. Horace Lye [name] is a 83 y.o. male with a history of pulmonary fibrosis, HFrEF, HTN, HLD, T2DM, CAD s/p CABG, and persistent atrial fibrillation who presents for consultation in the [facility]. Hospital admission 2/27-10/24/23 for weakness and SOB found to be Covid-19 positive and course complicated by Afib with RVR. Discharged in rate controlled Afib taking Lopressor  100 mg BID and maintenance dose of digoxin . Patient is on Xarelto  20 mg daily for a CHADS2VASC score of 6. S/p successful DCCV on 11/12/23 with ERAF noted by Dr. Lavonne Prairie 12/20/23.   On follow up 01/01/24, patient is here for Tikosyn admission. No new medications since last OV. No benadryl  use. No missed doses of Xarelto  20 mg daily. [name]  has been held prior to admission. He took Lantus  10 units yesterday evening due to increased blood sugars since [name]  has been held.   Today, he denies symptoms of palpitations, chest pain, shortness of breath, orthopnea, PND, lower extremity edema, dizziness, presyncope, syncope, snoring, daytime somnolence, bleeding, or neurologic sequela. The patient is tolerating medications without difficulties and is otherwise without complaint today.    he has a BMI of Body mass index is 19.92 kg/m.Todd [name] Filed Weights   01/01/24 0918  Weight: 59.4 kg     Current Outpatient Medications  Medication Sig Dispense Refill   acetaminophen  (TYLENOL ) 650 MG CR tablet Take 650 mg by mouth as needed for pain.     alendronate  (FOSAMAX ) 70 MG tablet Take 1 tablet (70 mg total) by mouth every 7 (seven) days. Take with a full glass of water on an empty stomach. 12 tablet 3   amoxicillin  (AMOXIL ) 500 MG tablet Take 500 mg by mouth 3 (three) times daily.     atorvastatin  (LIPITOR ) 80 MG tablet Take 1 tablet (80 mg total) by  mouth at bedtime. 90 tablet 3   calcitonin, salmon, (MIACALCIN/FORTICAL) 200 UNIT/ACT nasal spray Place 1 spray into alternate nostrils daily. 3.7 mL 0   Continuous Glucose Receiver (FREESTYLE LIBRE 3 READER) DEVI 1 each by Does not apply route 4 (four) times daily -  before meals and at bedtime. 1 each 0   Continuous Glucose Receiver (FREESTYLE LIBRE 3 READER) DEVI USE AS DIRECTED FOR CONTINUOUS BLOOD GLUCOSE MONITORING (CHECK BEFORE MEALS AND AT BEDTIME AS DIRECTED)     Continuous Glucose Sensor (FREESTYLE LIBRE 3 SENSOR) MISC 1 each by Does not apply route 4 (four) times daily -  before meals and at bedtime. Place 1 sensor on the skin every 14 days. Use to check glucose continuously 2 each 5   Continuous Glucose Sensor (FREESTYLE LIBRE 3 SENSOR) MISC PLACE 1 SENSOR ON THE SKIN EVERY 14 DAYS. USE TO CHECK GLUCOSE CONTINUOUSLY BEFORE MEALS AND AT BEDTIME.     cyanocobalamin  (VITAMIN B12) 1000 MCG tablet Take 1,000 mcg by mouth daily.     furosemide  (LASIX ) 40 MG tablet Take 1 tablet (40 mg total) by mouth daily.     GLOBAL EASE INJECT PEN NEEDLES 31G X 5 MM MISC USE TO INJECT INSULIN  3 TIMES DAILY. MAY DISPENSE ANY MANUFACTURER COVERED BY PATIENT'S INSURANCE. 100 each 1   insulin  aspart (NOVOLOG ) 100 UNIT/ML FlexPen Inject 4 Units into the skin 3 (three) times daily with meals. Only take if eating a meal AND Blood Glucose (BG) is 80 or higher. (Patient  taking differently: Inject 4 Units into the skin 3 (three) times daily as needed for high blood sugar (Only take if eating a meal AND Blood Glucose (BG) is 80 or higher.).) 15 mL 0   insulin  glargine (LANTUS ) 100 UNIT/ML injection Inject into the skin as needed.     Insulin  Pen Needle (PEN NEEDLES) 32G X 5 MM MISC UAD for daily lantus  injection 90 each 3   ipratropium (ATROVENT ) 0.06 % nasal spray Place 2-4 sprays into both nostrils every 8 (eight) hours as needed for rhinitis.     metFORMIN  (GLUCOPHAGE -XR) 500 MG 24 hr tablet Take 1 tablet (500 mg  total) by mouth 2 (two) times daily with a meal. 60 tablet 5   metoprolol  tartrate (LOPRESSOR ) 100 MG tablet TAKE 1 TABLET BY MOUTH 2 TIMES DAILY. 180 tablet 3   Multiple Vitamin (MULTIVITAMIN PO) Take 1 tablet by mouth daily.     Nintedanib  (OFEV ) 100 MG CAPS Take 1 capsule (100 mg total) by mouth 2 (two) times daily. 180 capsule 1   nitroGLYCERIN  (NITROSTAT ) 0.4 MG SL tablet Place 1 tablet (0.4 mg total) under the tongue every 5 (five) minutes as needed for chest pain. 100 tablet 3   Omega-3 Fatty Acids (FISH OIL PO) Take 1,030 mg by mouth 2 (two) times daily.     omeprazole  (PRILOSEC) 40 MG capsule TAKE 1 CAPSULE BY MOUTH 2 TIMES DAILY. (Patient taking differently: Take 40 mg by mouth every morning.) 120 capsule 5   ONETOUCH DELICA LANCETS 33G MISC TEST BLOOD SUGAR ONCE DAILY 100 each 3   ONETOUCH VERIO test strip USE UP TO 4 TIMES A DAY AS DIRECTED 100 strip 0   PIP PEN NEEDLES 31G X 31G X 5 MM MISC USE TO INJECT INSULIN  3 TIMES DAILY. MAY DISPENSE ANY MANUFACTURER COVERED BY PATIENT''S INSURANCE.     predniSONE  (DELTASONE ) 10 MG tablet TAKE 1 TABLET (10 MG TOTAL) BY MOUTH DAILY WITH BREAKFAST. (Patient taking differently: Take 5 mg by mouth daily with breakfast.) 30 tablet 5   rivaroxaban  (XARELTO ) 20 MG TABS tablet TAKE 1 TABLET (20 MG TOTAL) BY MOUTH DAILY WITH SUPPER. 30 tablet 5   spironolactone  (ALDACTONE ) 25 MG tablet Take 0.5 tablets (12.5 mg total) by mouth daily. 45 tablet 3   dapagliflozin  propanediol ([name] ) 10 MG TABS tablet Take 1 tablet (10 mg total) by mouth daily before breakfast. (Patient not taking: Reported on 01/01/2024)     No current facility-administered medications for this encounter.    Atrial Fibrillation Management history:  Previous antiarrhythmic drugs: none Previous cardioversions: 11/12/23 Previous ablations: none Anticoagulation history: Xarelto  20 mg   ROS- All systems are reviewed and negative except as per the HPI above.  Physical Exam: BP (!)  132/90   Pulse (!) 102   Ht 5\' 8"  (1.727 m)   Wt 59.4 kg   BMI 19.92 kg/m   GEN- The patient is well appearing, alert and oriented x 3 today.   Neck - no JVD or carotid bruit noted Lungs- Clear to ausculation bilaterally, normal work of breathing Heart- Irregular rate and rhythm, no murmurs, rubs or gallops, PMI not laterally displaced Extremities- no clubbing, cyanosis, or edema Skin - no rash or ecchymosis noted   EKG today demonstrates  Vent. rate 102 BPM PR interval * ms QRS duration 72 ms QT/QTcB 306/398 ms P-R-T axes * 96 -79 Atrial fibrillation with rapid ventricular response Rightward axis Cannot rule out Anterior infarct (cited on or before 20-Dec-2023) Abnormal ECG  When compared with ECG of 20-Dec-2023 14:06, QRS axis Shifted right Criteria for Inferior infarct are no longer Present Serial changes of Anterior infarct Present  Echo 10/19/23 demonstrated  1. Left ventricular ejection fraction, by estimation, is 45%. The left  ventricle has mildly decreased function. The left ventricle demonstrates  global hypokinesis. There is mild concentric left ventricular hypertrophy.  Left ventricular diastolic  parameters are indeterminate.   2. Right ventricular systolic function is mildly reduced. The right  ventricular size is mildly enlarged. There is normal pulmonary artery  systolic pressure. The estimated right ventricular systolic pressure is  33.3 mmHg.   3. Left atrial size was mildly dilated.   4. The mitral valve is degenerative. Mild mitral valve regurgitation. No  evidence of mitral stenosis. Moderate mitral annular calcification.   5. The aortic valve is tricuspid. There is moderate calcification of the  aortic valve. Aortic valve regurgitation is not visualized. Aortic valve  sclerosis/calcification is present, without any evidence of aortic  stenosis.   6. The inferior vena cava is dilated in size with <50% respiratory  variability, suggesting right atrial  pressure of 15 mmHg.   7. The patient was in atrial fibrillation.    ASSESSMENT & PLAN CHA2DS2-VASc Score = 6  The patient's score is based upon: CHF History: 1 HTN History: 1 Diabetes History: 1 Stroke History: 0 Vascular Disease History: 1 Age Score: 2 Gender Score: 0       ASSESSMENT AND PLAN: Persistent Atrial Fibrillation (ICD10:  I48.19) The patient's CHA2DS2-VASc score is 6, indicating a 9.7% annual risk of stroke.   S/p DCCV on 11/12/23.  He is currently in Afib.  Patient would like to pursue dofetilide and presents for dofetilide admission. Continue Xarelto , states no missed doses in the last 3 weeks. No recent benadryl  use PharmD has screened medications Labs pending.  CrCl 47 mL/min Qtc in NSR 432 ms   Secondary Hypercoagulable State (ICD10:  D68.69) The patient is at significant risk for stroke/thromboembolism based upon his CHA2DS2-VASc Score of 6.  Continue Rivaroxaban  (Xarelto ).  No missed doses.     Patient will present to admissions when a bed is available. Addendum: Patient appears to be assigned to room 6E25.    Minnie Amber, PA-C  [facility] [address] [place], [place] [address] [phone]

## 2024-01-01 NOTE — H&P (Addendum)
 Primary Care Physician: Colene Dauphin, MD Primary Cardiologist: Eilleen Grates, MD Electrophysiologist: Boyce Byes, MD     Referring Physician: Dr. Horace Lye R Todd [name] is a 83 y.o. male with a history of pulmonary fibrosis, HFrEF, HTN, HLD, T2DM, CAD s/p CABG, and persistent atrial fibrillation who presents for consultation in the [facility]. Hospital admission 2/27-10/24/23 for weakness and SOB found to be Covid-19 positive and course complicated by Afib with RVR. Discharged in rate controlled Afib taking Lopressor  100 mg BID and maintenance dose of digoxin . Patient is on Xarelto  20 mg daily for a CHADS2VASC score of 6. S/p successful DCCV on 11/12/23 with ERAF noted by Dr. Lavonne Prairie 12/20/23.   On follow up 01/01/24, patient is here for Tikosyn admission. No new medications since last OV. No benadryl  use. No missed doses of Xarelto  20 mg daily. [name]  has been held prior to admission. He took Lantus  10 units yesterday evening due to increased blood sugars since [name]  has been held.   Today, he denies symptoms of palpitations, chest pain, shortness of breath, orthopnea, PND, lower extremity edema, dizziness, presyncope, syncope, snoring, daytime somnolence, bleeding, or neurologic sequela. The patient is tolerating medications without difficulties and is otherwise without complaint today.    he has a BMI of Body mass index is 20.04 kg/m.Todd [name] Filed Weights   01/01/24 1300  Weight: 59.8 kg     Current Facility-Administered Medications  Medication Dose Route Frequency Provider Last Rate Last Admin   0.9 %  sodium chloride  infusion  250 mL Intravenous PRN [name], Todd E, MD       acetaminophen  (TYLENOL ) tablet 650 mg  650 mg Oral Q6H PRN Boyce Byes, MD       amoxicillin  (AMOXIL ) tablet 500 mg  500 mg Oral TID Suarez, Joseph J, PA-C       atorvastatin  (LIPITOR ) tablet 80 mg  80 mg Oral QHS Suarez, Joseph J, PA-C       dofetilide (TIKOSYN)  capsule 250 mcg  250 mcg Oral BID [name], Todd E, MD       famotidine  (PEPCID ) tablet 20 mg  20 mg Oral QHS Nathanel Bal, PA-C       [START ON 01/02/2024] furosemide  (LASIX ) tablet 40 mg  40 mg Oral Daily Suarez, Joseph J, PA-C       insulin  aspart (novoLOG ) injection 4 Units  4 Units Subcutaneous TID WC Boyce Byes, MD       [START ON 01/02/2024] insulin  glargine-yfgn (SEMGLEE ) injection 10 Units  10 Units Subcutaneous Daily Nathanel Bal, PA-C       magnesium  sulfate IVPB 4 g 100 mL  4 g Intravenous STAT Carney, Skipper Dumas, RPH       metFORMIN  (GLUCOPHAGE -XR) 24 hr tablet 500 mg  500 mg Oral BID WC Suarez, Joseph J, PA-C       metoprolol  tartrate (LOPRESSOR ) tablet 100 mg  100 mg Oral BID Suarez, Joseph J, PA-C       Nintedanib  (Ofev ) CAPS 100 mg  100 mg Oral BID Suarez, Joseph J, PA-C       [START ON 01/02/2024] pantoprazole  (PROTONIX ) EC tablet 80 mg  80 mg Oral Daily Suarez, Joseph J, PA-C       [START ON 01/02/2024] predniSONE  (DELTASONE ) tablet 5 mg  5 mg Oral Q breakfast Nathanel Bal, PA-C       rivaroxaban  (XARELTO ) tablet 20 mg  20 mg Oral Q supper  [name], Todd E, MD       sodium chloride  flush (NS) 0.9 % injection 3 mL  3 mL Intravenous Q12H [name], Todd E, MD       sodium chloride  flush (NS) 0.9 % injection 3 mL  3 mL Intravenous PRN [name], Todd E, MD       [START ON 01/02/2024] spironolactone  (ALDACTONE ) tablet 12.5 mg  12.5 mg Oral Daily Nathanel Bal, PA-C        Atrial Fibrillation Management history:  Previous antiarrhythmic drugs: none Previous cardioversions: 11/12/23 Previous ablations: none Anticoagulation history: Xarelto  20 mg   ROS- All systems are reviewed and negative except as per the HPI above.  Physical Exam: BP 117/87 (BP Location: Left Arm)   Pulse (!) 129   Temp 98.2 F (36.8 C) (Oral)   Resp 20   Ht 5\' 8"  (1.727 m)   Wt 59.8 kg   SpO2 100%   BMI 20.04 kg/m   GEN- The patient is well appearing, alert and oriented x  3 today.   Neck - no JVD or carotid bruit noted Lungs- Clear to ausculation bilaterally, normal work of breathing Heart- Irregular rate and rhythm, no murmurs, rubs or gallops, PMI not laterally displaced Extremities- no clubbing, cyanosis, or edema Skin - no rash or ecchymosis noted   EKG today demonstrates  Vent. rate 102 BPM PR interval * ms QRS duration 72 ms QT/QTcB 306/398 ms P-R-T axes * 96 -79 Atrial fibrillation with rapid ventricular response Rightward axis Cannot rule out Anterior infarct (cited on or before 20-Dec-2023) Abnormal ECG When compared with ECG of 20-Dec-2023 14:06, QRS axis Shifted right Criteria for Inferior infarct are no longer Present Serial changes of Anterior infarct Present  Echo 10/19/23 demonstrated  1. Left ventricular ejection fraction, by estimation, is 45%. The left  ventricle has mildly decreased function. The left ventricle demonstrates  global hypokinesis. There is mild concentric left ventricular hypertrophy.  Left ventricular diastolic  parameters are indeterminate.   2. Right ventricular systolic function is mildly reduced. The right  ventricular size is mildly enlarged. There is normal pulmonary artery  systolic pressure. The estimated right ventricular systolic pressure is  33.3 mmHg.   3. Left atrial size was mildly dilated.   4. The mitral valve is degenerative. Mild mitral valve regurgitation. No  evidence of mitral stenosis. Moderate mitral annular calcification.   5. The aortic valve is tricuspid. There is moderate calcification of the  aortic valve. Aortic valve regurgitation is not visualized. Aortic valve  sclerosis/calcification is present, without any evidence of aortic  stenosis.   6. The inferior vena cava is dilated in size with <50% respiratory  variability, suggesting right atrial pressure of 15 mmHg.   7. The patient was in atrial fibrillation.    ASSESSMENT & PLAN CHA2DS2-VASc Score = 6  The patient's score is  based upon: CHF History: 1 HTN History: 1 Diabetes History: 1 Stroke History: 0 Vascular Disease History: 1 Age Score: 2 Gender Score: 0       ASSESSMENT AND PLAN: Persistent Atrial Fibrillation (ICD10:  I48.19) The patient's CHA2DS2-VASc score is 6, indicating a 9.7% annual risk of stroke.   S/p DCCV on 11/12/23.  He is currently in Afib.  Patient would like to pursue dofetilide and presents for dofetilide admission. Continue Xarelto , states no missed doses in the last 3 weeks. No recent benadryl  use PharmD has screened medications Labs pending.  CrCl 47 mL/min Qtc in NSR 432 ms   Secondary  Hypercoagulable State (ICD10:  D68.69) The patient is at significant risk for stroke/thromboembolism based upon his CHA2DS2-VASc Score of 6.  Continue Rivaroxaban  (Xarelto ).  No missed doses.     Patient will present to admissions when a bed is available. Addendum: Patient appears to be assigned to room 6E25.   Afib Clinic [facility] [address] [place], [place] [address] [phone]   ADDEND: See above AFib clinic note for H&P Patient was advised in anticipation of DCCV during his admission to hold his [name]  (given need for anesthesia) Since then BS have been high, in d/w his PMD and advised to use once daily long acting insulin  10u daily until able to get back on [name]  (Lantus ), he also reports that since off his edema has gotten worse (last dose of [name]  was Friday 5/9)  He has significant LE edema, took his PO lasix  at home as well as his spironolactone  K+ 5.0 Will give 40mg  IV x1 lasix , K+ and Creat  should tolerate   Labs pre-admission today BS was 304 CBG 402 Reviewed with pharmacy Written for sliding scale This AM he took 4u of his Novolog  only Has eaten today but no further insulin . Home lantus  is written for > to give now  Hgb A1c of late 11 > 12.7 > 8.6 Also chronically on steroids for his p.Fibrosis  Also note: march his dig level was  2.3 and was stopped Will for completeness recheck, though expect will be better  Family will bring in his Ofev  Chronically on prednisone , p.Fibrosis  On amoxil  for dental infection, started yesterday  K+ 5.0 Mag 1.5 Creat 0.78 ( dose) Pharmacy team has addressed mag and will follow up per protocol D/w pharmacy, if Mag level is acceptable at recheck, OK to start later tonight  He confirms no missed doses of his Xarelto  Discussed pending labs/electrolytes, he may not get started today, they are all aware  Mertha Abrahams, PA-C

## 2024-01-02 ENCOUNTER — Other Ambulatory Visit (HOSPITAL_COMMUNITY): Payer: Self-pay

## 2024-01-02 ENCOUNTER — Telehealth (HOSPITAL_COMMUNITY): Payer: Self-pay | Admitting: Pharmacy Technician

## 2024-01-02 DIAGNOSIS — I10 Essential (primary) hypertension: Secondary | ICD-10-CM

## 2024-01-02 DIAGNOSIS — I4819 Other persistent atrial fibrillation: Secondary | ICD-10-CM | POA: Diagnosis not present

## 2024-01-02 DIAGNOSIS — I5020 Unspecified systolic (congestive) heart failure: Secondary | ICD-10-CM | POA: Diagnosis not present

## 2024-01-02 LAB — BASIC METABOLIC PANEL WITH GFR
Anion gap: 7 (ref 5–15)
BUN: 33 mg/dL — ABNORMAL HIGH (ref 8–23)
CO2: 25 mmol/L (ref 22–32)
Calcium: 8.5 mg/dL — ABNORMAL LOW (ref 8.9–10.3)
Chloride: 106 mmol/L (ref 98–111)
Creatinine, Ser: 0.83 mg/dL (ref 0.61–1.24)
GFR, Estimated: >60 mL/min (ref >60)
Glucose, Bld: 107 mg/dL — ABNORMAL HIGH (ref 70–99)
Potassium: 3.7 mmol/L (ref 3.5–5.1)
Sodium: 138 mmol/L (ref 135–145)

## 2024-01-02 LAB — GLUCOSE, CAPILLARY
Glucose-Capillary: 249 mg/dL — ABNORMAL HIGH (ref 70–99)
Glucose-Capillary: 142 mg/dL — ABNORMAL HIGH (ref 70–99)
Glucose-Capillary: 67 mg/dL — ABNORMAL LOW (ref 70–99)
Glucose-Capillary: 70 mg/dL (ref 70–99)
Glucose-Capillary: 130 mg/dL — ABNORMAL HIGH (ref 70–99)

## 2024-01-02 LAB — MAGNESIUM: Magnesium: 2.1 mg/dL (ref 1.7–2.4)

## 2024-01-02 MED ORDER — FUROSEMIDE 10 MG/ML IJ SOLN: 20.0000 mg | Freq: Once | INTRAMUSCULAR | Status: DC

## 2024-01-02 MED ORDER — SODIUM CHLORIDE 0.9% FLUSH: 3.0000 mL | INTRAVENOUS | Status: DC | PRN

## 2024-01-02 MED ORDER — NINTEDANIB ESYLATE 100 MG PO CAPS
100.0000 mg | ORAL_CAPSULE | Freq: Two times a day (BID) | ORAL | Status: DC
  Administered 2024-01-02 – 2024-01-04 (×4): 100 mg via ORAL
  Filled 2024-01-02 (×7): qty 1

## 2024-01-02 MED ORDER — POTASSIUM CHLORIDE CRYS ER 20 MEQ PO TBCR
60.0000 meq | EXTENDED_RELEASE_TABLET | Freq: Once | ORAL | Status: AC
  Administered 2024-01-02: 60 meq via ORAL
  Filled 2024-01-02: qty 3

## 2024-01-02 MED ORDER — SODIUM CHLORIDE 0.9% FLUSH
3.0000 mL | Freq: Two times a day (BID) | INTRAVENOUS | Status: DC
Start: 2024-01-02 — End: 2024-01-04
  Administered 2024-01-02 (×2): 3 mL via INTRAVENOUS
  Administered 2024-01-03 – 2024-01-04 (×2): 10 mL via INTRAVENOUS

## 2024-01-02 NOTE — Progress Notes (Signed)
 Rounding Note    Patient Name: Todd [name] Date of Encounter: 01/02/2024  [facility] Cardiologist: Eilleen Grates, MD   Subjective   Doing well, denies any SOB, CP, cardiac awareness  Inpatient Medications    Scheduled Meds:  amoxicillin   500 mg Oral TID   atorvastatin   80 mg Oral QHS   dofetilide  250 mcg Oral BID   famotidine   20 mg Oral QHS   furosemide   40 mg Oral Daily   insulin  aspart  0-15 Units Subcutaneous TID WC   insulin  aspart  0-5 Units Subcutaneous QHS   insulin  aspart  4 Units Subcutaneous TID WC   insulin  glargine-yfgn  10 Units Subcutaneous Daily   metFORMIN   500 mg Oral BID WC   metoprolol  tartrate  100 mg Oral BID   Nintedanib   100 mg Oral BID   pantoprazole   80 mg Oral Daily   predniSONE   5 mg Oral Q breakfast   rivaroxaban   20 mg Oral Q supper   sodium chloride  flush  3 mL Intravenous Q12H   spironolactone   12.5 mg Oral Daily   Continuous Infusions:  sodium chloride      PRN Meds: sodium chloride , acetaminophen , sodium chloride  flush   Vital Signs    Vitals:   01/01/24 2208 01/01/24 2353 01/02/24 0500 01/02/24 0747  BP: 113/62 136/89 104/75 110/82  Pulse: (!) 115 (!) 108 (!) 105 (!) 110  Resp:  19 16 17   Temp:  (!) 97.5 F (36.4 C) 97.6 F (36.4 C) 98.1 F (36.7 C)  TempSrc:  Oral Oral Oral  SpO2:  100% 100% 100%  Weight:      Height:        Intake/Output Summary (Last 24 hours) at 01/02/2024 0928 Last data filed at 01/01/2024 2353 Gross per 24 hour  Intake --  Output 930 ml  Net -930 ml      01/01/2024    1:00 PM 01/01/2024    9:18 AM 12/27/2023    1:17 PM  Last 3 Weights  Weight (lbs) 131 lb 12.8 oz 131 lb 127 lb  Weight (kg) 59.784 kg 59.421 kg 57.607 kg      Telemetry    AFib 100's, rare PVC, had one 5 beat WC rhythm, very irregular, suspect abberent - Personally Reviewed  ECG    AFib 116bpm, QTc - Personally Reviewed, with Dr. Marven Slimmer  Physical Exam   GEN: No acute distress.   Neck: No  JVD Cardiac: irreg-irreg, no murmurs, rubs, or gallops.  Respiratory: CTA b/l. GI: Soft, nontender, non-distended  MS: 1++ edema (improved); No deformity. Neuro:  Nonfocal  Psych: Normal affect   Labs    High Sensitivity Troponin:  No results for input(s): "TROPONINIHS" in the last 720 hours.   Chemistry Recent Labs  Lab 01/01/24 0924 01/01/24 2016 01/02/24 0453  NA 138  --  138  K 5.0  --  3.7  CL 103  --  106  CO2 27  --  25  GLUCOSE 304*  --  107*  BUN 36*  --  33*  CREATININE 0.78  --  0.83  CALCIUM  8.8  --  8.5*  MG 1.5* 2.6* 2.1  GFRNONAA  --   --  >60  ANIONGAP  --   --  7    Lipids No results for input(s): "CHOL", "TRIG", "HDL", "LABVLDL", "LDLCALC", "CHOLHDL" in the last 168 hours.  HematologyNo results for input(s): "WBC", "RBC", "HGB", "HCT", "MCV", "MCH", "MCHC", "RDW", "PLT" in  the last 168 hours. Thyroid  No results for input(s): "TSH", "FREET4" in the last 168 hours.  BNPNo results for input(s): "BNP", "PROBNP" in the last 168 hours.  DDimer No results for input(s): "DDIMER" in the last 168 hours.   Radiology    No results found.  Cardiac Studies   Echo 10/19/23  1. Left ventricular ejection fraction, by estimation, is 45%. The left  ventricle has mildly decreased function. The left ventricle demonstrates  global hypokinesis. There is mild concentric left ventricular hypertrophy.  Left ventricular diastolic  parameters are indeterminate.   2. Right ventricular systolic function is mildly reduced. The right  ventricular size is mildly enlarged. There is normal pulmonary artery  systolic pressure. The estimated right ventricular systolic pressure is  33.3 mmHg.   3. Left atrial size was mildly dilated.   4. The mitral valve is degenerative. Mild mitral valve regurgitation. No  evidence of mitral stenosis. Moderate mitral annular calcification.   5. The aortic valve is tricuspid. There is moderate calcification of the  aortic valve. Aortic valve  regurgitation is not visualized. Aortic valve  sclerosis/calcification is present, without any evidence of aortic  stenosis.   6. The inferior vena cava is dilated in size with <50% respiratory  variability, suggesting right atrial pressure of 15 mmHg.   7. The patient was in atrial fibrillation.   Patient Profile     83 y.o. male w/PMHx of  P.Fibrosis DM, HTN, HLD CAD (CABG) AFib HFmrEF Admitted for tikosyn load  Assessment & Plan    Persistent AFib CHA2DS2Vasc is 6, on Xarelto  Tikosyn load is in progress K+ 3.7 Mag 2.1 Creat 0.83 (stable), pharmacy team calculated CrCl at 60 > dose d/w dr. Marven Slimmer, will keep unchanged QTc stable  DCCV tomorrow if not in SR  P.fibrosis Home meds Chronic steroids Family to bring in his Ofev   HTN Home meds  DM Poorly controlled coming in SSI ordered Home insulins as well Off farxiga  for DCCV  CAD No symptoms Home meds  6. HFmrEF Marked edema coming in, better though not resolved No SOB Pt reports brisk urine output yesterday with IV lasix  > he got his PO dose already today will follow with this   For questions or updates, please contact [facility] Please consult [email or web address] for contact info under        Signed, Debbie Fails, PA-C  01/02/2024, 9:28 AM

## 2024-01-02 NOTE — Telephone Encounter (Signed)
 Patient Product/process development scientist completed.    The patient is insured through [employer]/ [employer]. Patient has Medicare and is not eligible for a copay card, but may be able to apply for patient assistance or Medicare RX Payment Plan (Patient Must reach out to their plan, if eligible for payment plan), if available.    Ran test claim for dofetilide (Tikosyn) 250 mcg and the current 30 day co-pay is $65.68.   This test claim was processed through [facility]- copay amounts may vary at other pharmacies due to pharmacy/plan contracts, or as the patient moves through the different stages of their insurance plan.     Roland Earl, CPHT Pharmacy Technician III Certified Patient Advocate [facility] Patient Advocate Team Direct Number: [phone]  Fax: [phone]

## 2024-01-02 NOTE — Progress Notes (Signed)
 Pharmacy: Dofetilide (Tikosyn) - Follow Up Assessment and Electrolyte Replacement  Pharmacy consulted to assist in monitoring and replacing electrolytes in this 83 y.o. male admitted on 01/01/2024 undergoing dofetilide initiation.   Labs:    Component Value Date/Time   K 3.7 01/02/2024 0453   MG 2.1 01/02/2024 0453     Plan: Potassium: K 3.5-3.7:  Give KCl 60 mEq po x1   Magnesium : Mg > 2: No additional supplementation needed   Thank you for allowing pharmacy to participate in this patient's care   Baxter Limber, PharmD Clinical Pharmacist **Pharmacist phone directory can now be found on [email or web address] ([email or web address]).  Listed under [facility].

## 2024-01-02 NOTE — Plan of Care (Signed)
  Problem: Education: Goal: Knowledge of General Education information will improve Description: Including pain rating scale, medication(s)/side effects and non-pharmacologic comfort measures Outcome: Progressing   Problem: Health Behavior/Discharge Planning: Goal: Ability to manage health-related needs will improve Outcome: Progressing   Problem: Clinical Measurements: Goal: Ability to maintain clinical measurements within normal limits will improve Outcome: Progressing Goal: Will remain free from infection Outcome: Progressing Goal: Diagnostic test results will improve Outcome: Progressing Goal: Respiratory complications will improve Outcome: Progressing Goal: Cardiovascular complication will be avoided Outcome: Progressing   Problem: Activity: Goal: Risk for activity intolerance will decrease Outcome: Progressing   Problem: Nutrition: Goal: Adequate nutrition will be maintained Outcome: Progressing   Problem: Coping: Goal: Level of anxiety will decrease Outcome: Progressing   Problem: Elimination: Goal: Will not experience complications related to bowel motility Outcome: Progressing Goal: Will not experience complications related to urinary retention Outcome: Progressing   Problem: Pain Managment: Goal: General experience of comfort will improve and/or be controlled Outcome: Progressing   Problem: Safety: Goal: Ability to remain free from injury will improve Outcome: Progressing   Problem: Skin Integrity: Goal: Risk for impaired skin integrity will decrease Outcome: Progressing   Problem: Education: Goal: Knowledge of disease or condition will improve Outcome: Progressing Goal: Understanding of medication regimen will improve Outcome: Progressing Goal: Individualized Educational Video(s) Outcome: Progressing   Problem: Activity: Goal: Ability to tolerate increased activity will improve Outcome: Progressing   Problem: Cardiac: Goal: Ability to achieve  and maintain adequate cardiopulmonary perfusion will improve Outcome: Progressing   Problem: Health Behavior/Discharge Planning: Goal: Ability to safely manage health-related needs after discharge will improve Outcome: Progressing   Problem: Education: Goal: Ability to describe self-care measures that may prevent or decrease complications (Diabetes Survival Skills Education) will improve Outcome: Progressing Goal: Individualized Educational Video(s) Outcome: Progressing   Problem: Coping: Goal: Ability to adjust to condition or change in health will improve Outcome: Progressing   Problem: Fluid Volume: Goal: Ability to maintain a balanced intake and output will improve Outcome: Progressing   Problem: Health Behavior/Discharge Planning: Goal: Ability to identify and utilize available resources and services will improve Outcome: Progressing Goal: Ability to manage health-related needs will improve Outcome: Progressing   Problem: Metabolic: Goal: Ability to maintain appropriate glucose levels will improve Outcome: Progressing   Problem: Nutritional: Goal: Maintenance of adequate nutrition will improve Outcome: Progressing Goal: Progress toward achieving an optimal weight will improve Outcome: Progressing   Problem: Skin Integrity: Goal: Risk for impaired skin integrity will decrease Outcome: Progressing   Problem: Tissue Perfusion: Goal: Adequacy of tissue perfusion will improve Outcome: Progressing

## 2024-01-02 NOTE — Progress Notes (Signed)
 Post dose EKG is reviewed with dr. Marven Slimmer In an Aflutter, QTc is difficult Most reliably measured QTc , will continue same dose Tikosyn Plan DCCV tomorrow  Mertha Abrahams, PA-C

## 2024-01-03 ENCOUNTER — Encounter (HOSPITAL_COMMUNITY)
Admission: AD | Disposition: A | Discharge status: Home / Self Care | Payer: Self-pay | Source: Ambulatory Visit | Attending: Cardiology

## 2024-01-03 DIAGNOSIS — I4819 Other persistent atrial fibrillation: Secondary | ICD-10-CM | POA: Diagnosis not present

## 2024-01-03 LAB — BASIC METABOLIC PANEL WITH GFR
Anion gap: 10 (ref 5–15)
BUN: 26 mg/dL — ABNORMAL HIGH (ref 8–23)
CO2: 24 mmol/L (ref 22–32)
Calcium: 8.9 mg/dL (ref 8.9–10.3)
Chloride: 103 mmol/L (ref 98–111)
Creatinine, Ser: 0.82 mg/dL (ref 0.61–1.24)
GFR, Estimated: >60 mL/min (ref >60)
Glucose, Bld: 86 mg/dL (ref 70–99)
Potassium: 4.4 mmol/L (ref 3.5–5.1)
Sodium: 137 mmol/L (ref 135–145)

## 2024-01-03 LAB — GLUCOSE, CAPILLARY
Glucose-Capillary: 110 mg/dL — ABNORMAL HIGH (ref 70–99)
Glucose-Capillary: 94 mg/dL (ref 70–99)
Glucose-Capillary: 95 mg/dL (ref 70–99)
Glucose-Capillary: 270 mg/dL — ABNORMAL HIGH (ref 70–99)

## 2024-01-03 LAB — MAGNESIUM: Magnesium: 1.6 mg/dL — ABNORMAL LOW (ref 1.7–2.4)

## 2024-01-03 SURGERY — CARDIOVERSION (CATH LAB)
Anesthesia: General

## 2024-01-03 MED ORDER — MAGNESIUM SULFATE 4 GM/100ML IV SOLN
4.0000 g | Freq: Once | INTRAVENOUS | Status: AC
  Administered 2024-01-03: 4 g via INTRAVENOUS
  Filled 2024-01-03: qty 100

## 2024-01-03 NOTE — Plan of Care (Signed)

## 2024-01-03 NOTE — Progress Notes (Signed)
 Pharmacy: Dofetilide (Tikosyn) - Follow Up Assessment and Electrolyte Replacement  Pharmacy consulted to assist in monitoring and replacing electrolytes in this 83 y.o. male admitted on 01/01/2024 undergoing dofetilide initiation.   Labs:    Component Value Date/Time   K 4.4 01/03/2024 0533   MG 1.6 (L) 01/03/2024 0533     Plan: Potassium: K >/= 4: No additional supplementation needed  Magnesium : Mg 1.3-1.7: Give Mg 4 gm IV x1     Thank you for allowing pharmacy to participate in this patient's care   Baxter Limber, PharmD Clinical Pharmacist **Pharmacist phone directory can now be found on [email or web address] ([email or web address]).  Listed under [facility].

## 2024-01-03 NOTE — Progress Notes (Signed)
 Two hour post Tikosyn dose EKG obtained and placed on chart.

## 2024-01-03 NOTE — Progress Notes (Signed)
 Rounding Note    Patient Name: Todd [name] Date of Encounter: 01/03/2024   [facility] Cardiologist: Eilleen Grates, MD   Subjective   Doing well, denies any SOB, CP, cardiac awareness, happy to hear he is back in rhythm  Inpatient Medications    Scheduled Meds:  amoxicillin   500 mg Oral TID   atorvastatin   80 mg Oral QHS   dofetilide  250 mcg Oral BID   famotidine   20 mg Oral QHS   furosemide   40 mg Oral Daily   insulin  aspart  0-15 Units Subcutaneous TID WC   insulin  aspart  0-5 Units Subcutaneous QHS   insulin  aspart  4 Units Subcutaneous TID WC   insulin  glargine-yfgn  10 Units Subcutaneous Daily   metFORMIN   500 mg Oral BID WC   metoprolol  tartrate  100 mg Oral BID   Nintedanib   100 mg Oral BID WC   pantoprazole   80 mg Oral Daily   predniSONE   5 mg Oral Q breakfast   rivaroxaban   20 mg Oral Q supper   sodium chloride  flush  3 mL Intravenous Q12H   sodium chloride  flush  3-10 mL Intravenous Q12H   spironolactone   12.5 mg Oral Daily   Continuous Infusions:  magnesium  sulfate bolus IVPB 4 g (01/03/24 0959)   PRN Meds: acetaminophen , sodium chloride  flush, sodium chloride  flush   Vital Signs    Vitals:   01/02/24 2239 01/03/24 0011 01/03/24 0421 01/03/24 0716  BP: 121/84 (!) 145/99 131/77 125/81  Pulse: 78 72 77 80  Resp:  16 15 18   Temp:  98.6 F (37 C) 98.3 F (36.8 C) 98 F (36.7 C)  TempSrc:  Oral Oral Oral  SpO2:  99% 100% 95%  Weight:      Height:        Intake/Output Summary (Last 24 hours) at 01/03/2024 1017 Last data filed at 01/03/2024 1000 Gross per 24 hour  Intake 3 ml  Output 300 ml  Net -297 ml      01/01/2024    1:00 PM 01/01/2024    9:18 AM 12/27/2023    1:17 PM  Last 3 Weights  Weight (lbs) 131 lb 12.8 oz 131 lb 127 lb  Weight (kg) 59.784 kg 59.421 kg 57.607 kg      Telemetry    AFib 100's, rare PVC, >> SR 80's - Personally Reviewed  ECG    Post dose: SR 81bpm QTc This AM SR 74bpm, QTc 483 - Personally  Reviewed, with Dr. Marven Slimmer  Physical Exam   GEN: No acute distress.   Neck: No JVD Cardiac: RRR, no murmurs, rubs, or gallops.  Respiratory: CTA b/l. GI: Soft, nontender, non-distended  MS: trace edema; No deformity. Neuro:  Nonfocal  Psych: Normal affect   Labs    High Sensitivity Troponin:  No results for input(s): "TROPONINIHS" in the last 720 hours.   Chemistry Recent Labs  Lab 01/01/24 0924 01/01/24 2016 01/02/24 0453 01/03/24 0533  NA 138  --  138 137  K 5.0  --  3.7 4.4  CL 103  --  106 103  CO2 27  --  25 24  GLUCOSE 304*  --  107* 86  BUN 36*  --  33* 26*  CREATININE 0.78  --  0.83 0.82  CALCIUM  8.8  --  8.5* 8.9  MG 1.5* 2.6* 2.1 1.6*  GFRNONAA  --   --  >60 >60  ANIONGAP  --   --  7 10  Lipids No results for input(s): "CHOL", "TRIG", "HDL", "LABVLDL", "LDLCALC", "CHOLHDL" in the last 168 hours.  HematologyNo results for input(s): "WBC", "RBC", "HGB", "HCT", "MCV", "MCH", "MCHC", "RDW", "PLT" in the last 168 hours. Thyroid  No results for input(s): "TSH", "FREET4" in the last 168 hours.  BNPNo results for input(s): "BNP", "PROBNP" in the last 168 hours.  DDimer No results for input(s): "DDIMER" in the last 168 hours.   Radiology    No results found.  Cardiac Studies   Echo 10/19/23  1. Left ventricular ejection fraction, by estimation, is 45%. The left  ventricle has mildly decreased function. The left ventricle demonstrates  global hypokinesis. There is mild concentric left ventricular hypertrophy.  Left ventricular diastolic  parameters are indeterminate.   2. Right ventricular systolic function is mildly reduced. The right  ventricular size is mildly enlarged. There is normal pulmonary artery  systolic pressure. The estimated right ventricular systolic pressure is  33.3 mmHg.   3. Left atrial size was mildly dilated.   4. The mitral valve is degenerative. Mild mitral valve regurgitation. No  evidence of mitral stenosis. Moderate mitral annular  calcification.   5. The aortic valve is tricuspid. There is moderate calcification of the  aortic valve. Aortic valve regurgitation is not visualized. Aortic valve  sclerosis/calcification is present, without any evidence of aortic  stenosis.   6. The inferior vena cava is dilated in size with <50% respiratory  variability, suggesting right atrial pressure of 15 mmHg.   7. The patient was in atrial fibrillation.   Patient Profile     83 y.o. male w/PMHx of  P.Fibrosis DM, HTN, HLD CAD (CABG) AFib HFmrEF Admitted for tikosyn load  Assessment & Plan    Persistent AFib CHA2DS2Vasc is 6, on Xarelto  Tikosyn load is in progress K+ 4.4 Mag 1.6 Creat 0.82 (stable), pharmacy team calculated CrCl at 60 > dose d/w Dr. Marven Slimmer, will keep unchanged QTc stable    P.fibrosis Home meds Chronic steroids No symptoms Family to bring in his Ofev   HTN Home meds  DM Poorly controlled coming in > much better SSI ordered Home insulins as well Off farxiga  for DCCV  CAD No symptoms Home meds  6. HFmrEF Marked edema coming in, better though not resolved No SOB Much improved   For questions or updates, please contact [facility] Please consult [email or web address] for contact info under        Signed, Debbie Fails, PA-C  01/03/2024, 10:17 AM

## 2024-01-03 NOTE — TOC CM/SW Note (Signed)
 Transition of Care [facility]) - Inpatient Brief Assessment   Patient Details  Name: Todd [name] MRN: [id number] Date of Birth: [birth year]  Transition of Care [facility]) CM/SW Contact:    Cosimo Diones, RN Phone Number: 01/03/2024, 12:00 PM   Clinical Narrative: Patient presented for Tikosyn Load. Case Manager spoke with the patient regarding co pay cost. Patient is not agreeable to cost and would like to use Good [facility]. Patient wants the initial Rx filled via [facility] and the Rx refills 90 day supply escribed to [facility] [address] [place], [place] [address]. No further needs identified at this time. Case Manager will continue to follow for additional transition of care needs as the patient progresses.    Transition of Care Asessment: Insurance and Status: Insurance coverage has been reviewed Patient has primary care physician: Yes Home environment has been reviewed: reviewed Prior level of function:: independent Prior/Current Home Services: No current home services Social Drivers of Health Review: SDOH reviewed no interventions necessary Readmission risk has been reviewed: Yes Transition of care needs: no transition of care needs at this time

## 2024-01-03 NOTE — Plan of Care (Signed)
  Problem: Education: Goal: Knowledge of General Education information will improve Description: Including pain rating scale, medication(s)/side effects and non-pharmacologic comfort measures Outcome: Progressing   Problem: Clinical Measurements: Goal: Diagnostic test results will improve Outcome: Progressing Goal: Cardiovascular complication will be avoided Outcome: Progressing   Problem: Activity: Goal: Ability to tolerate increased activity will improve Outcome: Progressing

## 2024-01-03 NOTE — Plan of Care (Signed)
 Alert, oriented to self. Intermittent confusion noted.  Able to ambulate in room and transfer bed to chair.  Family at bedside.   Problem: Education: Goal: Knowledge of General Education information will improve Description: Including pain rating scale, medication(s)/side effects and non-pharmacologic comfort measures Outcome: Progressing   Problem: Health Behavior/Discharge Planning: Goal: Ability to manage health-related needs will improve Outcome: Progressing   Problem: Clinical Measurements: Goal: Ability to maintain clinical measurements within normal limits will improve Outcome: Progressing Goal: Will remain free from infection Outcome: Progressing Goal: Diagnostic test results will improve Outcome: Progressing

## 2024-01-03 NOTE — Progress Notes (Signed)
 Patient here for Tikosin load with DCCV scheduled for 01/03/2024. Patient converted to NSR. Two EKGs confirmed this, one at 2248 01/02/24 and one at 0436 01/03/2024. Dr. Teofilo Fellers paged to alert.

## 2024-01-03 NOTE — Progress Notes (Signed)
 Post dose EKG reviewed with Dr. Marven Slimmer. SR 71, manually measured QT 440/QTc Continue Tikosyn load  Patient's daughter this afternoon reports some slight confusion perhaps, they have seen this at home intermittently as well He is appropriate and clear currently Advised to keep the bind open, keep him oriented, put the news/TV on, to help sometimes as well And to have him see his PMD  Mertha Abrahams, PA-C

## 2024-01-04 ENCOUNTER — Other Ambulatory Visit (HOSPITAL_COMMUNITY): Payer: Self-pay

## 2024-01-04 DIAGNOSIS — I4819 Other persistent atrial fibrillation: Secondary | ICD-10-CM | POA: Diagnosis not present

## 2024-01-04 LAB — GLUCOSE, CAPILLARY
Glucose-Capillary: 208 mg/dL — ABNORMAL HIGH (ref 70–99)
Glucose-Capillary: 102 mg/dL — ABNORMAL HIGH (ref 70–99)
Glucose-Capillary: 78 mg/dL (ref 70–99)
Glucose-Capillary: 210 mg/dL — ABNORMAL HIGH (ref 70–99)

## 2024-01-04 LAB — BASIC METABOLIC PANEL WITH GFR
Anion gap: 8 (ref 5–15)
BUN: 24 mg/dL — ABNORMAL HIGH (ref 8–23)
CO2: 25 mmol/L (ref 22–32)
Calcium: 9 mg/dL (ref 8.9–10.3)
Chloride: 104 mmol/L (ref 98–111)
Creatinine, Ser: 0.76 mg/dL (ref 0.61–1.24)
GFR, Estimated: >60 mL/min (ref >60)
Glucose, Bld: 154 mg/dL — ABNORMAL HIGH (ref 70–99)
Potassium: 4.1 mmol/L (ref 3.5–5.1)
Sodium: 137 mmol/L (ref 135–145)

## 2024-01-04 LAB — MAGNESIUM: Magnesium: 1.8 mg/dL (ref 1.7–2.4)

## 2024-01-04 MED ORDER — MAGNESIUM SULFATE 2 GM/50ML IV SOLN: 2.0000 g | Freq: Once | INTRAVENOUS | Status: DC

## 2024-01-04 MED ORDER — MAGNESIUM SULFATE 2 GM/50ML IV SOLN
2.0000 g | Freq: Once | INTRAVENOUS | Status: AC
  Administered 2024-01-04: 2 g via INTRAVENOUS
  Filled 2024-01-04: qty 50

## 2024-01-04 MED ORDER — MAGNESIUM SULFATE 4 GM/100ML IV SOLN: 4.0000 g | Freq: Once | INTRAVENOUS | Status: DC

## 2024-01-04 MED ORDER — DOFETILIDE 250 MCG PO CAPS
250.0000 ug | ORAL_CAPSULE | Freq: Two times a day (BID) | ORAL | 5 refills | Status: DC
  Filled 2024-01-04: qty 60, 30d supply, fill #0

## 2024-01-04 MED ORDER — MAGNESIUM OXIDE 400 MG PO TABS
400.0000 mg | ORAL_TABLET | Freq: Two times a day (BID) | ORAL | 5 refills | Status: DC
  Filled 2024-01-04: qty 60, 30d supply, fill #0

## 2024-01-04 NOTE — Progress Notes (Signed)
 AM labs resulted: Mag came back 0.9 He has been running 1.5-2.0 and getting replacement after a level 1.6 yesterday Telemetry is stable Given another patient of ours on the floor had a extremely low mag level that recheck was wnl suspcet this is also a error.  I have d/w pharmacy to start with 2gm replacement now and RN for stat mag level  Full note to follow  Mertha Abrahams, PA-C

## 2024-01-04 NOTE — Discharge Summary (Signed)
 ELECTROPHYSIOLOGY PROCEDURE DISCHARGE SUMMARY    Patient ID: Todd [name],  MRN: [id number], DOB/AGE: Aug 30, 1940 83 y.o.  Admit date: 01/01/2024 Discharge date: 01/04/2024  Primary Care Physician: Colene Dauphin, MD  Primary Cardiologist: Dr. Lavonne Prairie Electrophysiologist: Dr. Marven Slimmer  Primary Discharge Diagnosis:  1.  persistent atrial fibrillation status post Tikosyn loading this admission      CHA2DS2Vasc is 6, on Xarelto   Secondary Discharge Diagnosis:  DM HTN CAD HFmrEF  Allergies  Allergen Reactions   Amlodipine Besylate     Rash Because of a history of documented adverse serious drug reaction;Medi Alert bracelet  is recommended   Iodinated Contrast Media Rash    Rash  Because of a history of documented adverse serious drug reaction;Medi Alert bracelet  is recommended  Rash, Because of a history of documented adverse serious drug reaction;Medi Alert bracelet  is recommended   Metformin  And Related Diarrhea    Take a lower dose     Procedures This Admission:  1.  Tikosyn loading   Brief HPI: Todd [name] is a 83 y.o. male with a past medical history as noted above.  They were referred to EP in the outpatient setting for treatment options of atrial fibrillation.  Risks, benefits, and alternatives to Tikosyn were reviewed with the patient who wished to proceed.    Hospital Course:  The patient was admitted and Tikosyn was initiated.  Renal function and electrolytes were followed during the hospitalization.  The patient's QTc remained stable, final EKG reviewed with Dr. Marven Slimmer as well.  He converted with drug and did not require DCCV  the patient was monitored until discharge on telemetry which demonstrated AFib > SR, PACs.  On the day of discharge, he feels well, was examined by Dr Marven Slimmer who considered the patient stable for discharge to home.  Follow-up has been arranged with the AFib clinic in 1 week and with the EP team in 4 weeks.   Tikosyn teaching  was completed Mag ox 400mg  BID for electrolyte replacement for home  He has had some diarrhea with the high dose mag here, discussed if it continues with the PO magnesium  supplement to let us  know  Physical Exam: Vitals:   01/03/24 2320 01/04/24 0448 01/04/24 0710 01/04/24 1142  BP: 123/79 129/77 121/76 120/71  Pulse: 65 64 80 64  Resp: 20 16 17  (!) 21  Temp: 97.9 F (36.6 C) 98.3 F (36.8 C) 97.8 F (36.6 C) 97.8 F (36.6 C)  TempSrc: Oral Oral Oral Oral  SpO2: 96% 93% 95%   Weight:      Height:         GEN- The patient is well appearing, alert and oriented x 3 today.   HEENT: normocephalic, atraumatic; sclera clear, conjunctiva pink; hearing intact; oropharynx clear; neck supple, no JVP Lymph- no cervical lymphadenopathy Lungs- CTA b/l, normal work of breathing.  No wheezes, rales, rhonchi Heart- RRR, no murmurs, rubs or gallops, PMI not laterally displaced GI- soft, non-tender, non-distended Extremities- no clubbing, cyanosis, or edema MS- no significant deformity or atrophy Skin- warm and dry, no rash or lesion Psych- euthymic mood, full affect Neuro- strength and sensation are intact   Labs:   Lab Results  Component Value Date   WBC 12.4 (H) 12/06/2023   HGB 13.3 12/06/2023   HCT 40.8 12/06/2023   MCV 107.3 (H) 12/06/2023   PLT 288.0 12/06/2023    Recent Labs  Lab 01/04/24 0806  NA 137  K 4.1  CL 104  CO2 25  BUN 24*  CREATININE 0.76  CALCIUM  9.0  GLUCOSE 154*     Discharge Medications:  Allergies as of 01/04/2024       Reactions   Amlodipine Besylate    Rash Because of a history of documented adverse serious drug reaction;Medi Alert bracelet  is recommended   Iodinated Contrast Media Rash   Rash Because of a history of documented adverse serious drug reaction;Medi Alert bracelet  is recommended Rash, Because of a history of documented adverse serious drug reaction;Medi Alert bracelet  is recommended   Metformin  And Related Diarrhea   Take a  lower dose        Medication List     TAKE these medications    acetaminophen  650 MG CR tablet Commonly known as: TYLENOL  Take 650 mg by mouth as needed for pain.   alendronate  70 MG tablet Commonly known as: FOSAMAX  Take 1 tablet (70 mg total) by mouth every 7 (seven) days. Take with a full glass of water on an empty stomach.   amoxicillin  500 MG tablet Commonly known as: AMOXIL  Take 500 mg by mouth 3 (three) times daily.   atorvastatin  80 MG tablet Commonly known as: LIPITOR  Take 1 tablet (80 mg total) by mouth at bedtime.   calcitonin (salmon) 200 UNIT/ACT nasal spray Commonly known as: MIACALCIN/FORTICAL Place 1 spray into alternate nostrils daily.   cyanocobalamin  1000 MCG tablet Commonly known as: VITAMIN B12 Take 1,000 mcg by mouth every Monday, Wednesday, and Friday.   dapagliflozin  propanediol 10 MG Tabs tablet Commonly known as: [name]  Take 1 tablet (10 mg total) by mouth daily before breakfast.   dofetilide 250 MCG capsule Commonly known as: TIKOSYN Take 1 capsule (250 mcg total) by mouth 2 (two) times daily.   FISH OIL PO Take 1 capsule by mouth 2 (two) times daily.   FreeStyle Libre 3 Reader Devi 1 each by Does not apply route 4 (four) times daily -  before meals and at bedtime.   FreeStyle Libre 3 Reader Devi USE AS DIRECTED FOR CONTINUOUS BLOOD GLUCOSE MONITORING (CHECK BEFORE MEALS AND AT BEDTIME AS DIRECTED)   FreeStyle Libre 3 Sensor Misc 1 each by Does not apply route 4 (four) times daily -  before meals and at bedtime. Place 1 sensor on the skin every 14 days. Use to check glucose continuously   FreeStyle Libre 3 Sensor Misc PLACE 1 SENSOR ON THE SKIN EVERY 14 DAYS. USE TO CHECK GLUCOSE CONTINUOUSLY BEFORE MEALS AND AT BEDTIME.   furosemide  40 MG tablet Commonly known as: LASIX  Take 1 tablet (40 mg total) by mouth daily. What changed: when to take this   insulin  aspart 100 UNIT/ML FlexPen Commonly known as: NOVOLOG  Inject 4 Units  into the skin 3 (three) times daily with meals. Only take if eating a meal AND Blood Glucose (BG) is 80 or higher. What changed:  how much to take when to take this additional instructions   insulin  glargine 100 UNIT/ML injection Commonly known as: LANTUS  Inject 10 Units into the skin as needed.   ipratropium 0.06 % nasal spray Commonly known as: ATROVENT  Place 2-4 sprays into both nostrils every 8 (eight) hours as needed for rhinitis.   magnesium  oxide 400 MG tablet Commonly known as: MAG-OX Take 1 tablet (400 mg total) by mouth 2 (two) times daily.   metFORMIN  500 MG 24 hr tablet Commonly known as: GLUCOPHAGE -XR Take 1 tablet (500 mg total) by mouth 2 (two) times daily with  a meal.   metoprolol  tartrate 100 MG tablet Commonly known as: LOPRESSOR  TAKE 1 TABLET BY MOUTH 2 TIMES DAILY.   MULTIVITAMIN PO Take 1 tablet by mouth daily.   nitroGLYCERIN  0.4 MG SL tablet Commonly known as: Nitrostat  Place 1 tablet (0.4 mg total) under the tongue every 5 (five) minutes as needed for chest pain.   Ofev  100 MG Caps Generic drug: Nintedanib  Take 1 capsule (100 mg total) by mouth 2 (two) times daily.   omeprazole  40 MG capsule Commonly known as: PRILOSEC TAKE 1 CAPSULE BY MOUTH 2 TIMES DAILY. What changed: when to take this   OneTouch Delica Lancets 33G Misc TEST BLOOD SUGAR ONCE DAILY   OneTouch Verio test strip Generic drug: glucose blood USE UP TO 4 TIMES A DAY AS DIRECTED   Pen Needles 32G X 5 MM Misc UAD for daily lantus  injection   Pip Pen Needles 31G x 31G X 5 MM Misc Generic drug: Insulin  Pen Needle USE TO INJECT INSULIN  3 TIMES DAILY. MAY DISPENSE ANY MANUFACTURER COVERED BY PATIENT''S INSURANCE.   Global Ease Inject Pen Needles 31G X 5 MM Misc Generic drug: Insulin  Pen Needle USE TO INJECT INSULIN  3 TIMES DAILY. MAY DISPENSE ANY MANUFACTURER COVERED BY PATIENT'S INSURANCE.   PEPCID  PO Take 2 tablets by mouth at bedtime.   predniSONE  10 MG  tablet Commonly known as: DELTASONE  TAKE 1 TABLET (10 MG TOTAL) BY MOUTH DAILY WITH BREAKFAST. What changed: how much to take   spironolactone  25 MG tablet Commonly known as: ALDACTONE  Take 0.5 tablets (12.5 mg total) by mouth daily.   Xarelto  20 MG Tabs tablet Generic drug: rivaroxaban  TAKE 1 TABLET (20 MG TOTAL) BY MOUTH DAILY WITH SUPPER.        Disposition: home Discharge Instructions     Diet - low sodium heart healthy   Complete by: As directed    Increase activity slowly   Complete by: As directed    No wound care   Complete by: As directed         Duration of Discharge Encounter: 14 minutes, APP time.  [place], PA-C 01/04/2024 2:27 PM

## 2024-01-04 NOTE — Progress Notes (Addendum)
 Pharmacy: Dofetilide (Tikosyn) - Follow Up Assessment and Electrolyte Replacement  Pharmacy consulted to assist in monitoring and replacing electrolytes in this 83 y.o. male admitted on 01/01/2024 undergoing dofetilide initiation.   Labs:    Component Value Date/Time   K 4.1 01/04/2024 0806   MG 0.9 (LL) 01/04/2024 0806     Plan: Potassium: K >/= 4: No additional supplementation needed  Magnesium : -repeat Mg level    As patient has required on average ~ 10 mEq of potassium replacement every day, I recommend no oral potassium at discharge  Thank you for allowing pharmacy to participate in this patient's care   Baxter Limber, PharmD Clinical Pharmacist **Pharmacist phone directory can now be found on [email or web address] ([email or web address]).  Listed under [facility].  Addendum -repeat Mg = 1.8, Mg 2gm given earlier today  Plan -No additional Mg needed  Baxter Limber, PharmD Clinical Pharmacist **Pharmacist phone directory can now be found on [email or web address] ([email or web address]).  Listed under [facility].

## 2024-01-04 NOTE — Inpatient Diabetes Management (Signed)
 Inpatient Diabetes Program Recommendations  AACE/ADA: New Consensus Statement on Inpatient Glycemic Control (2015)  Target Ranges:  Prepandial:   less than 140 mg/dL      Peak postprandial:   less than 180 mg/dL (1-2 hours)      Critically ill patients:  140 - 180 mg/dL   Lab Results  Component Value Date   GLUCAP 210 (H) 01/04/2024   HGBA1C 8.6 (H) 12/06/2023    Review of Glycemic Control  Latest Reference Range & Units 01/03/24 08:05 01/03/24 13:04 01/03/24 16:23 01/03/24 21:36 01/04/24 07:37 01/04/24 11:41  Glucose-Capillary 70 - 99 mg/dL 95  No meal coverage given 270 (H)  Novolog  12 units 208 (H)  Novolog  9 units 102 (H) 78  No meal coverage given 210 (H)     Diabetes history: DM 2 Outpatient Diabetes medications: Novolog  4-10 units tid, Lantus  10 units as needed, metformin  500 mg bid Current orders for Inpatient glycemic control:  Semglee  10 units Daily Novolog  0-15 units tid + hs Novolog  4 units tid meal coverage Metformin  500 mg bid  Renal function WNL  Inpatient Diabetes Program Recommendations:    -   Reduce Semglee  to 8 units -   Increase Novolog  meal coverage to 5 units tid if eating >50% of meals  RN staff: Please give meal coverage based on parameters if eating at least 50% of meals and glucose is at least 80 mg/dl.  Thanks, Eloise Hake RN, MSN, BC-ADM Inpatient Diabetes Coordinator Team Pager [phone] (8a-5p)

## 2024-01-05 DIAGNOSIS — I5021 Acute systolic (congestive) heart failure: Secondary | ICD-10-CM | POA: Diagnosis not present

## 2024-01-05 DIAGNOSIS — J9601 Acute respiratory failure with hypoxia: Secondary | ICD-10-CM | POA: Diagnosis not present

## 2024-01-07 ENCOUNTER — Other Ambulatory Visit: Payer: Self-pay | Admitting: Internal Medicine

## 2024-01-08 ENCOUNTER — Telehealth: Payer: Self-pay

## 2024-01-08 ENCOUNTER — Ambulatory Visit
Admission: RE | Admit: 2024-01-08 | Discharge: 2024-01-08 | Disposition: A | Discharge status: Home / Self Care | Source: Ambulatory Visit | Attending: Orthopedic Surgery | Admitting: Orthopedic Surgery

## 2024-01-08 DIAGNOSIS — M47812 Spondylosis without myelopathy or radiculopathy, cervical region: Secondary | ICD-10-CM

## 2024-01-08 DIAGNOSIS — M545 Low back pain, unspecified: Secondary | ICD-10-CM

## 2024-01-08 DIAGNOSIS — M4854XA Collapsed vertebra, not elsewhere classified, thoracic region, initial encounter for fracture: Secondary | ICD-10-CM | POA: Diagnosis not present

## 2024-01-08 DIAGNOSIS — M48061 Spinal stenosis, lumbar region without neurogenic claudication: Secondary | ICD-10-CM

## 2024-01-08 DIAGNOSIS — S22080A Wedge compression fracture of T11-T12 vertebra, initial encounter for closed fracture: Secondary | ICD-10-CM

## 2024-01-08 DIAGNOSIS — M47816 Spondylosis without myelopathy or radiculopathy, lumbar region: Secondary | ICD-10-CM

## 2024-01-08 LAB — MAGNESIUM: Magnesium: 1.9 mg/dL (ref 1.7–2.4)

## 2024-01-08 NOTE — Telephone Encounter (Signed)
 Tamiko from [facility] called to let Dr. Marven Slimmer know that there was an equipment failure on Friday morning and patient's Magnesiam was not 0.9, and this lab was drawn again a few hours later and was normal at 1.8. Tamiko stated this would be corrected in the system.

## 2024-01-16 ENCOUNTER — Ambulatory Visit (HOSPITAL_COMMUNITY)
Admit: 2024-01-16 | Discharge: 2024-01-16 | Disposition: A | Discharge status: Home / Self Care | Attending: Internal Medicine | Admitting: Internal Medicine

## 2024-01-16 ENCOUNTER — Encounter (HOSPITAL_COMMUNITY): Payer: Self-pay | Admitting: Internal Medicine

## 2024-01-16 VITALS — BP 118/88 | HR 79 | Ht 68.0 in | Wt 127.6 lb

## 2024-01-16 DIAGNOSIS — D6869 Other thrombophilia: Secondary | ICD-10-CM

## 2024-01-16 DIAGNOSIS — Z5181 Encounter for therapeutic drug level monitoring: Secondary | ICD-10-CM

## 2024-01-16 DIAGNOSIS — Z79899 Other long term (current) drug therapy: Secondary | ICD-10-CM | POA: Diagnosis not present

## 2024-01-16 DIAGNOSIS — I4819 Other persistent atrial fibrillation: Secondary | ICD-10-CM

## 2024-01-16 MED ORDER — MAGNESIUM OXIDE 400 MG PO TABS: 400.0000 mg | ORAL_TABLET | Freq: Every day | ORAL | 5 refills | Status: DC

## 2024-01-16 MED ORDER — DOFETILIDE 250 MCG PO CAPS: 250.0000 ug | ORAL_CAPSULE | Freq: Two times a day (BID) | ORAL | 2 refills | Status: DC

## 2024-01-16 NOTE — Progress Notes (Signed)
 Primary Care Physician: Colene Dauphin, MD Primary Cardiologist: Eilleen Grates, MD Electrophysiologist: Boyce Byes, MD     Referring Physician: Dr. Horace Lye R Schuneman is a 83 y.o. male with a history of pulmonary fibrosis, HFrEF, HTN, HLD, T2DM, CAD s/p CABG, and persistent atrial fibrillation who presents for consultation in the [facility]. Hospital admission 2/27-10/24/23 for weakness and SOB found to be Covid-19 positive and course complicated by Afib with RVR. Discharged in rate controlled Afib taking Lopressor  100 mg BID and maintenance dose of digoxin . Patient is on Xarelto  20 mg daily for a CHADS2VASC score of 6. S/p successful DCCV on 11/12/23 with ERAF noted by Dr. Lavonne Prairie 12/20/23.   On follow up 01/01/24, patient is here for Tikosyn  admission. No new medications since last OV. No benadryl  use. No missed doses of Xarelto  20 mg daily. [name]  has been held prior to admission. He took Lantus  10 units yesterday evening due to increased blood sugars since [name]  has been held.   On follow up 01/16/24, patient is here for 1 week Tikosyn  surveillance. S/p Tikosyn  admission 5/13-16/2025. Patient did not require cardioversion. Telemetry showed Afib > SR, PACs. He has not missed any doses of Tikosyn  250 mcg BID or Xarelto  20 mg daily. Patient notes to have significant diarrhea on magnesium  twice daily.   Today, he denies symptoms of palpitations, chest pain, shortness of breath, orthopnea, PND, lower extremity edema, dizziness, presyncope, syncope, snoring, daytime somnolence, bleeding, or neurologic sequela. The patient is tolerating medications without difficulties and is otherwise without complaint today.   he has a BMI of Body mass index is 19.4 kg/m.Aaron Aas Filed Weights   01/16/24 0944  Weight: 57.9 kg     Current Outpatient Medications  Medication Sig Dispense Refill   acetaminophen  (TYLENOL ) 650 MG CR tablet Take 650 mg by mouth as needed for  pain.     alendronate  (FOSAMAX ) 70 MG tablet Take 1 tablet (70 mg total) by mouth every 7 (seven) days. Take with a full glass of water on an empty stomach. 12 tablet 3   amoxicillin  (AMOXIL ) 500 MG tablet Take 500 mg by mouth 3 (three) times daily.     atorvastatin  (LIPITOR ) 80 MG tablet Take 1 tablet (80 mg total) by mouth at bedtime. 90 tablet 3   calcitonin, salmon, (MIACALCIN/FORTICAL) 200 UNIT/ACT nasal spray Place 1 spray into alternate nostrils daily. 3.7 mL 0   Continuous Glucose Receiver (FREESTYLE LIBRE 3 READER) DEVI 1 each by Does not apply route 4 (four) times daily -  before meals and at bedtime. 1 each 0   Continuous Glucose Receiver (FREESTYLE LIBRE 3 READER) DEVI USE AS DIRECTED FOR CONTINUOUS BLOOD GLUCOSE MONITORING (CHECK BEFORE MEALS AND AT BEDTIME AS DIRECTED)     Continuous Glucose Sensor (FREESTYLE LIBRE 3 SENSOR) MISC 1 each by Does not apply route 4 (four) times daily -  before meals and at bedtime. Place 1 sensor on the skin every 14 days. Use to check glucose continuously 2 each 5   Continuous Glucose Sensor (FREESTYLE LIBRE 3 SENSOR) MISC PLACE 1 SENSOR ON THE SKIN EVERY 14 DAYS. USE TO CHECK GLUCOSE CONTINUOUSLY BEFORE MEALS AND AT BEDTIME.     cyanocobalamin  (VITAMIN B12) 1000 MCG tablet Take 1,000 mcg by mouth every Monday, Wednesday, and Friday.     dapagliflozin  propanediol ([name] ) 10 MG TABS tablet Take 1 tablet (10 mg total) by mouth daily before breakfast.     dofetilide  (TIKOSYN ) 250  MCG capsule Take 1 capsule (250 mcg total) by mouth 2 (two) times daily. 60 capsule 5   Famotidine  (PEPCID  PO) Take 2 tablets by mouth at bedtime.     furosemide  (LASIX ) 40 MG tablet Take 1 tablet (40 mg total) by mouth daily. (Patient taking differently: Take 40 mg by mouth in the morning.)     GLOBAL EASE INJECT PEN NEEDLES 31G X 5 MM MISC USE TO INJECT INSULIN  3 TIMES DAILY. MAY DISPENSE ANY MANUFACTURER COVERED BY PATIENT'S INSURANCE. 100 each 1   insulin  glargine (LANTUS ) 100  UNIT/ML injection Inject 10 Units into the skin as needed.     Insulin  Pen Needle (PEN NEEDLES) 32G X 5 MM MISC UAD for daily lantus  injection 90 each 3   ipratropium (ATROVENT ) 0.06 % nasal spray Place 2-4 sprays into both nostrils every 8 (eight) hours as needed for rhinitis.     metFORMIN  (GLUCOPHAGE -XR) 500 MG 24 hr tablet Take 1 tablet (500 mg total) by mouth 2 (two) times daily with a meal. 60 tablet 5   metoprolol  tartrate (LOPRESSOR ) 100 MG tablet TAKE 1 TABLET BY MOUTH 2 TIMES DAILY. 180 tablet 3   Multiple Vitamin (MULTIVITAMIN PO) Take 1 tablet by mouth daily.     Nintedanib  (OFEV ) 100 MG CAPS Take 1 capsule (100 mg total) by mouth 2 (two) times daily. 180 capsule 1   nitroGLYCERIN  (NITROSTAT ) 0.4 MG SL tablet Place 1 tablet (0.4 mg total) under the tongue every 5 (five) minutes as needed for chest pain. 100 tablet 3   NOVOLOG  FLEXPEN 100 UNIT/ML FlexPen INJECT 4 UNITS INTO THE SKIN 3 TIMES DAILY WITH MEALS. ONLY TAKE IF EATING A MEAL AND BLOOD GLUCOSE (BG) IS 80 OR HIGHER. 15 mL 0   Omega-3 Fatty Acids (FISH OIL PO) Take 1 capsule by mouth 2 (two) times daily.     omeprazole  (PRILOSEC) 40 MG capsule TAKE 1 CAPSULE BY MOUTH 2 TIMES DAILY. (Patient taking differently: Take 40 mg by mouth every morning.) 120 capsule 5   ONETOUCH DELICA LANCETS 33G MISC TEST BLOOD SUGAR ONCE DAILY 100 each 3   ONETOUCH VERIO test strip USE UP TO 4 TIMES A DAY AS DIRECTED 100 strip 0   PIP PEN NEEDLES 31G X 31G X 5 MM MISC USE TO INJECT INSULIN  3 TIMES DAILY. MAY DISPENSE ANY MANUFACTURER COVERED BY PATIENT''S INSURANCE.     predniSONE  (DELTASONE ) 10 MG tablet TAKE 1 TABLET (10 MG TOTAL) BY MOUTH DAILY WITH BREAKFAST. (Patient taking differently: Take 5 mg by mouth daily with breakfast.) 30 tablet 5   rivaroxaban  (XARELTO ) 20 MG TABS tablet TAKE 1 TABLET (20 MG TOTAL) BY MOUTH DAILY WITH SUPPER. 30 tablet 5   spironolactone  (ALDACTONE ) 25 MG tablet Take 0.5 tablets (12.5 mg total) by mouth daily. 45 tablet  3   magnesium  oxide (MAG-OX) 400 MG tablet Take 1 tablet (400 mg total) by mouth daily. 30 tablet 5   No current facility-administered medications for this encounter.    Atrial Fibrillation Management history:  Previous antiarrhythmic drugs: Tikosyn  Previous cardioversions: 11/12/23 Previous ablations: none Anticoagulation history: Xarelto  20 mg   ROS- All systems are reviewed and negative except as per the HPI above.  Physical Exam: BP 118/88   Pulse 79   Ht 5\' 8"  (1.727 m)   Wt 57.9 kg   BMI 19.40 kg/m   GEN- The patient is well appearing, alert and oriented x 3 today.   Neck - no JVD or carotid bruit noted  Lungs- Clear to ausculation bilaterally, normal work of breathing Heart- Regular rate and rhythm, no murmurs, rubs or gallops, PMI not laterally displaced Extremities- no clubbing, cyanosis, or edema Skin - no rash or ecchymosis noted   EKG today demonstrates  Vent. rate 79 BPM PR interval 214 ms QRS duration 78 ms QT/QTcB 418/480 ms P-R-T axes 27 -24 116 Sinus rhythm with 1st degree A-V block with Premature atrial complexes Left ventricular hypertrophy with repolarization abnormality ( R in aVL , Cornell product , Romhilt-Estes ) Anterior infarct , age undetermined Prolonged QT Abnormal ECG When compared with ECG of 04-Jan-2024 10:43, Premature atrial complexes are now Present Confirmed by Minnie Amber ([id number]) on 01/16/2024 9:54:31 AM  Echo 10/19/23 demonstrated  1. Left ventricular ejection fraction, by estimation, is 45%. The left  ventricle has mildly decreased function. The left ventricle demonstrates  global hypokinesis. There is mild concentric left ventricular hypertrophy.  Left ventricular diastolic  parameters are indeterminate.   2. Right ventricular systolic function is mildly reduced. The right  ventricular size is mildly enlarged. There is normal pulmonary artery  systolic pressure. The estimated right ventricular systolic pressure is  33.3  mmHg.   3. Left atrial size was mildly dilated.   4. The mitral valve is degenerative. Mild mitral valve regurgitation. No  evidence of mitral stenosis. Moderate mitral annular calcification.   5. The aortic valve is tricuspid. There is moderate calcification of the  aortic valve. Aortic valve regurgitation is not visualized. Aortic valve  sclerosis/calcification is present, without any evidence of aortic  stenosis.   6. The inferior vena cava is dilated in size with <50% respiratory  variability, suggesting right atrial pressure of 15 mmHg.   7. The patient was in atrial fibrillation.    ASSESSMENT & PLAN CHA2DS2-VASc Score = 6  The patient's score is based upon: CHF History: 1 HTN History: 1 Diabetes History: 1 Stroke History: 0 Vascular Disease History: 1 Age Score: 2 Gender Score: 0      ASSESSMENT AND PLAN: Persistent Atrial Fibrillation (ICD10:  I48.19) The patient's CHA2DS2-VASc score is 6, indicating a 9.7% annual risk of stroke.   S/p DCCV on 11/12/23. S/p Tikosyn  admission 5/13-16/25  He is currently in NSR. Due to diarrhea, will have to lower magnesium  400 mg to once daily and will continue to monitor labs.   High risk medication monitoring (ICD10: [id number]) Patient requires ongoing monitoring for anti-arrhythmic medication which has the potential to cause life threatening arrhythmias or AV block. Qtc stable. Continue Tikosyn  250 mcg BID. Bmet and mag drawn today.  Secondary Hypercoagulable State (ICD10:  D68.69) The patient is at significant risk for stroke/thromboembolism based upon his CHA2DS2-VASc Score of 6.  Continue Rivaroxaban  (Xarelto ).  No missed doses.   Follow up in 1 month for Tikosyn  surveillance.   Minnie Amber, PA-C  [facility] [address] [place], [place] [address] [phone]

## 2024-01-16 NOTE — Patient Instructions (Signed)
 Decrease Magnesium to once daily

## 2024-01-17 ENCOUNTER — Ambulatory Visit (HOSPITAL_COMMUNITY): Payer: Self-pay | Admitting: Internal Medicine

## 2024-01-17 LAB — BASIC METABOLIC PANEL WITH GFR
BUN/Creatinine Ratio: 42 — ABNORMAL HIGH (ref 10–24)
BUN: 42 mg/dL — ABNORMAL HIGH (ref 8–27)
CO2: 21 mmol/L (ref 20–29)
Calcium: 9.4 mg/dL (ref 8.6–10.2)
Chloride: 101 mmol/L (ref 96–106)
Creatinine, Ser: 0.99 mg/dL (ref 0.76–1.27)
Glucose: 213 mg/dL — ABNORMAL HIGH (ref 70–99)
Potassium: 5.2 mmol/L (ref 3.5–5.2)
Sodium: 140 mmol/L (ref 134–144)
eGFR: 77 mL/min/{1.73_m2} (ref >59)

## 2024-01-17 LAB — MAGNESIUM: Magnesium: 2 mg/dL (ref 1.6–2.3)

## 2024-01-18 ENCOUNTER — Telehealth: Payer: Self-pay | Admitting: Orthopedic Surgery

## 2024-01-18 ENCOUNTER — Encounter: Payer: Self-pay | Admitting: Internal Medicine

## 2024-01-18 DIAGNOSIS — M8000XD Age-related osteoporosis with current pathological fracture, unspecified site, subsequent encounter for fracture with routine healing: Secondary | ICD-10-CM

## 2024-01-18 DIAGNOSIS — S22080A Wedge compression fracture of T11-T12 vertebra, initial encounter for closed fracture: Secondary | ICD-10-CM

## 2024-01-18 NOTE — Telephone Encounter (Signed)
 Reviewed with Dr. Marne Sings

## 2024-01-18 NOTE — Telephone Encounter (Signed)
 Hey Miriam:  Per Loyce Ruffini our PA it says we need to let the CT supervisor know about this patients CT allergy. Can you notify them of this or let me know who I should send this to if not? Thank you.  We have never done this before so I'm not sure who we need to notify! Thank you!

## 2024-01-18 NOTE — Telephone Encounter (Addendum)
 Reviewed with Dr. Marne Sings. Per his chart, he had rash with iodinated contrast.    Okay to do CT chest with contrast and note allergy. Radiology has pre-med protocol set up.   CT ordered to be done at [facility]. Called daughter Burdette Carolin and LM that radiology will be handling premedication for his contrast allergy.    Order says to contact CT supervisor regarding allergy to contrast, please call and let them know above.

## 2024-01-18 NOTE — Telephone Encounter (Signed)
 Spoke to his daughter. She agrees with getting CT of chest as recommended by radiology.   He is allergic to iodinated contrast media. Will discuss with radiology as they recommended contrast enhanced CT. Message to Dr. Marne Sings.   Will let her know once ordered. She would like it done at [facility] in [place].

## 2024-01-18 NOTE — Telephone Encounter (Signed)
 Called his daughter Burdette Carolin to discuss CT results regarding fluid collection right upper chest wall.   Recommend he have CT chest with and without contrast for further evaluation.   He has f/u with me on 01/30/24 to review imaging, but wanted to get CT prior to this.

## 2024-01-20 NOTE — Progress Notes (Signed)
 Referring Physician:  Colene Dauphin, MD [address] [place],  [place] [address]  Primary Physician:  Colene Dauphin, MD  History of Present Illness: 01/30/2024 Todd [name] has a history of HTN, CAD, afib, PVD, DM, NSTEMI, chronic systolic heart failure, pulmonary fibrosis, asbestosis, GERD, L3 and T12 compression fractures, hyperlipidemia.   Last seen by me on 12/27/23 for back pain along with legs giving way.     Previous lumbar xrays showed progression of T12 fracture from lumbar MRI in March. He has old L3 compression fracture as well as mild central stenosis L2-L3 and L4-L5 with moderate central stenosis L5-S1 and multilevel foraminal stenosis.    Cervical MRI shows mild central stenosis C5-C7 with cervical spondylosis and multilevel foraminal stenosis.   MRI of thoracic spine was ordered and he is here to review it. This MRI showed apparent 5.8 x 1.3 cm T2 hyperintense collection in the posterior right chest wall posterior to second and third ribs. CT of chest with contrast recommended.   CT chest scheduled for next week.   His mid back pain has improved. He has no current back pain. He still had leg weakness, right > left. He has frequent falls due to legs giving way.   He takes chronic prednisone . He is on XARELTO . PCP has him on fosamax  and he is scheduled for a DEXA.   He does not smoke.   Bowel/Bladder Dysfunction: none  Conservative measures:  Physical therapy: HHPT with [facility]- he's not sure this helps Multimodal medical therapy including regular antiinflammatories:  tylenol  Injections:  no epidural steroid injections  Past Surgery: no spinal surgeries  The symptoms are causing a significant impact on the patient's life.   Review of Systems:  A 10 point review of systems is negative, except for the pertinent positives and negatives detailed in the HPI.  Past Medical History: Past Medical History:  Diagnosis Date   Allergic rhinitis    Asthma     Atrial fibrillation with rapid ventricular response (HCC)    a. newly diagnosed 11/03/13, spont conv to NSR, placed on xarelto .   CAD (coronary artery disease)    a. 1999; s/p CABG: LIMA to LAD, SVG to OM1, SVG to OM2, SVG to RCA  b. Normal nuc 10/2013 (done because of new onset AF.)   COVID    Diabetes mellitus (HCC)    HLD (hyperlipidemia)    HTN (hypertension)    Nephrolithiasis    Overweight(278.02)    PVD (peripheral vascular disease) (HCC)    Carotid stenosis   Stroke [facility])     Past Surgical History: Past Surgical History:  Procedure Laterality Date   CARDIOVERSION N/A 11/12/2023   Procedure: CARDIOVERSION;  Surgeon: Luana Rumple, MD;  Location: [facility] INVASIVE CV LAB;  Service: Cardiovascular;  Laterality: N/A;   CAROTID ENDARTERECTOMY  1999   COLONOSCOPY  2014   negative;Dr Adan Holms   COLONOSCOPY W/ POLYPECTOMY  2009   Dr Adan Holms   CORONARY ARTERY BYPASS GRAFT  03/1998   HEMORRHOID BANDING     INTRACAPSULAR CATARACT EXTRACTION Bilateral 2018   LEFT HEART CATH AND CORS/GRAFTS ANGIOGRAPHY N/A 01/08/2023   Procedure: LEFT HEART CATH AND CORS/GRAFTS ANGIOGRAPHY;  Surgeon: Swaziland, Peter M, MD;  Location[facility] INVASIVE CV LAB;  Service: Cardiovascular;  Laterality: N/A;   NASAL SINUS SURGERY      Allergies: Allergies as of 01/30/2024 - Review Complete 01/30/2024  Allergen Reaction Noted   Amlodipine besylate     Iodinated contrast media Rash  06/22/2021   Metformin  and related Diarrhea 05/24/2023    Medications: Outpatient Encounter Medications as of 01/30/2024  Medication Sig   acetaminophen  (TYLENOL ) 650 MG CR tablet Take 650 mg by mouth as needed for pain.   alendronate  (FOSAMAX ) 70 MG tablet Take 1 tablet (70 mg total) by mouth every 7 (seven) days. Take with a full glass of water on an empty stomach.   amoxicillin  (AMOXIL ) 500 MG tablet Take 500 mg by mouth 3 (three) times daily.   atorvastatin  (LIPITOR ) 80 MG tablet Take 1 tablet (80 mg total) by mouth at bedtime.    Continuous Glucose Receiver (FREESTYLE LIBRE 3 READER) DEVI 1 each by Does not apply route 4 (four) times daily -  before meals and at bedtime.   Continuous Glucose Receiver (FREESTYLE LIBRE 3 READER) DEVI USE AS DIRECTED FOR CONTINUOUS BLOOD GLUCOSE MONITORING (CHECK BEFORE MEALS AND AT BEDTIME AS DIRECTED)   Continuous Glucose Sensor (FREESTYLE LIBRE 3 SENSOR) MISC 1 each by Does not apply route 4 (four) times daily -  before meals and at bedtime. Place 1 sensor on the skin every 14 days. Use to check glucose continuously   Continuous Glucose Sensor (FREESTYLE LIBRE 3 SENSOR) MISC PLACE 1 SENSOR ON THE SKIN EVERY 14 DAYS. USE TO CHECK GLUCOSE CONTINUOUSLY BEFORE MEALS AND AT BEDTIME.   cyanocobalamin  (VITAMIN B12) 1000 MCG tablet Take 1,000 mcg by mouth every Monday, Wednesday, and Friday.   dapagliflozin  propanediol ([name] ) 10 MG TABS tablet Take 1 tablet (10 mg total) by mouth daily before breakfast.   dofetilide  (TIKOSYN ) 250 MCG capsule Take 1 capsule (250 mcg total) by mouth 2 (two) times daily.   Famotidine  (PEPCID  PO) Take 2 tablets by mouth at bedtime.   furosemide  (LASIX ) 40 MG tablet Take 1 tablet (40 mg total) by mouth daily. (Patient taking differently: Take 40 mg by mouth in the morning.)   GLOBAL EASE INJECT PEN NEEDLES 31G X 5 MM MISC USE TO INJECT INSULIN  3 TIMES DAILY. MAY DISPENSE ANY MANUFACTURER COVERED BY PATIENT'S INSURANCE.   insulin  glargine (LANTUS ) 100 UNIT/ML injection Inject 10 Units into the skin as needed.   Insulin  Pen Needle (PEN NEEDLES) 32G X 5 MM MISC UAD for daily lantus  injection   ipratropium (ATROVENT ) 0.06 % nasal spray Place 2-4 sprays into both nostrils every 8 (eight) hours as needed for rhinitis.   magnesium  oxide (MAG-OX) 400 MG tablet Take 1 tablet (400 mg total) by mouth daily.   metFORMIN  (GLUCOPHAGE -XR) 500 MG 24 hr tablet Take 1 tablet (500 mg total) by mouth 2 (two) times daily with a meal.   metoprolol  tartrate (LOPRESSOR ) 100 MG tablet TAKE 1  TABLET BY MOUTH 2 TIMES DAILY.   Multiple Vitamin (MULTIVITAMIN PO) Take 1 tablet by mouth daily.   Nintedanib  (OFEV ) 100 MG CAPS Take 1 capsule (100 mg total) by mouth 2 (two) times daily.   nitroGLYCERIN  (NITROSTAT ) 0.4 MG SL tablet Place 1 tablet (0.4 mg total) under the tongue every 5 (five) minutes as needed for chest pain.   NOVOLOG  FLEXPEN 100 UNIT/ML FlexPen INJECT 4 UNITS INTO THE SKIN 3 TIMES DAILY WITH MEALS. ONLY TAKE IF EATING A MEAL AND BLOOD GLUCOSE (BG) IS 80 OR HIGHER.   Omega-3 Fatty Acids (FISH OIL PO) Take 1 capsule by mouth 2 (two) times daily.   omeprazole  (PRILOSEC) 40 MG capsule TAKE 1 CAPSULE BY MOUTH 2 TIMES DAILY. (Patient taking differently: Take 40 mg by mouth every morning.)   ONETOUCH DELICA LANCETS 33G  MISC TEST BLOOD SUGAR ONCE DAILY   ONETOUCH VERIO test strip USE UP TO 4 TIMES A DAY AS DIRECTED   PIP PEN NEEDLES 31G X 31G X 5 MM MISC USE TO INJECT INSULIN  3 TIMES DAILY. MAY DISPENSE ANY MANUFACTURER COVERED BY PATIENT''S INSURANCE.   predniSONE  (DELTASONE ) 10 MG tablet TAKE 1 TABLET (10 MG TOTAL) BY MOUTH DAILY WITH BREAKFAST. (Patient taking differently: Take 5 mg by mouth daily with breakfast.)   rivaroxaban  (XARELTO ) 20 MG TABS tablet TAKE 1 TABLET (20 MG TOTAL) BY MOUTH DAILY WITH SUPPER.   spironolactone  (ALDACTONE ) 25 MG tablet Take 0.5 tablets (12.5 mg total) by mouth daily.   [DISCONTINUED] calcitonin, salmon, (MIACALCIN/FORTICAL) 200 UNIT/ACT nasal spray Place 1 spray into alternate nostrils daily.   [DISCONTINUED] dofetilide  (TIKOSYN ) 250 MCG capsule Take 1 capsule (250 mcg total) by mouth 2 (two) times daily.   No facility-administered encounter medications on file as of 01/30/2024.    Social History: Social History   Tobacco Use   Smoking status: Never   Smokeless tobacco: Never   Tobacco comments:    Never smoked 01/16/24  Vaping Use   Vaping status: Never Used  Substance Use Topics   Alcohol use: No    Alcohol/week: 0.0 standard  drinks of alcohol   Drug use: No    Family Medical History: Family History  Problem Relation Age of Onset   Heart attack Mother 80   Diabetes Father        IDDM   Stroke Father 84   Asthma Sister    Lupus Sister    Fibromyalgia Sister    Fibromyalgia Sister    Prostate cancer Brother    Hemochromatosis Brother    Colon cancer Brother 72   Heart attack Brother 55   Heart attack Brother 19   Esophageal cancer Neg Hx    Rectal cancer Neg Hx    Stomach cancer Neg Hx     Physical Examination: Vitals:   01/30/24 1134  BP: 116/74    Awake, alert, oriented to person, place, and time.  Speech is clear and fluent. Fund of knowledge is appropriate.   Cranial Nerves: Pupils equal round and reactive to light.  Facial tone is symmetric.    No posterior cervical tenderness.   Mild tenderness at TL junction.   No abnormal lesions on exposed skin.   Strength: Side Biceps Triceps Deltoid Interossei Grip Wrist Ext. Wrist Flex.  R 5 5 5 5 5 5 5   L 5 5 5 5 5 5 5    Side Iliopsoas Quads Hamstring PF DF EHL  R 4 4+ 5 5 4+ 4+  L 5 5 5 5 5 5    He has difficulty following commands for strength testing.    Reflexes are 1+ and symmetric at the biceps, brachioradialis, patella and achilles.   Hoffman's is absent.  Clonus is not present.   Bilateral upper and lower extremity sensation is intact to light touch.  He ambulates with a rollator and hunches over when he walks. Slow gait.   Medical Decision Making  Imaging: MRI of thoracic spine dated 01/08/24:  FINDINGS: Alignment:  Normal.   Vertebrae: T12 compression fracture 40% anterior vertebral body height loss and prominent marrow edema, progressed from the prior lumbar MRI. L1 superior endplate compression fracture with 15% height loss and mild marrow edema, new from the prior MRI. No significant retropulsion. No suspicious marrow lesion.   Cord:  Normal signal and morphology.   Paraspinal and  other soft tissues: Apparent  5.8 x 1.3 cm T2 hyperintense collection in the posterior right chest wall posterior to second and third ribs. No obvious displaced rib fracture. No clear correlate on the prior chest CT.   Disc levels:   T5-6: Only imaged sagittally. Mild disc bulging without significant stenosis.   T6-7: Only imaged sagittally. Mild rightward disc bulging without significant stenosis.   T7-8: Disc bulging results in mild spinal stenosis without cord compression.   T8-9: Disc bulging without significant stenosis.   T9-10: Disc bulging, a left paracentral disc protrusion, and mild facet hypertrophy result in mild spinal stenosis and mild left neural foraminal stenosis. No cord compression.   T10-11: Mild disc bulging results in mild right neural foraminal stenosis without spinal stenosis.   T11-12: Disc bulging and mild-to-moderate facet and ligamentum flavum hypertrophy result in borderline to mild spinal and bilateral neural foraminal stenosis.   T12-L1: Mild facet hypertrophy without disc herniation or stenosis.   IMPRESSION: 1. Unhealed T12 compression fracture with worsening height loss and marrow edema since 11/13/2023. 2. New L1 compression fracture with 15% height loss and mild edema. 3. Mild thoracic disc and facet degeneration with mild spinal stenosis at T7-8 and T9-10. 4. Apparent 5.8 cm fluid collection or possible hematoma in the posterior right upper chest wall. Correlate with any history of trauma and consider obtaining a contrast enhanced chest CT for further evaluation.     Electronically Signed   By: Aundra Lee M.D.   On: 01/15/2024 11:52  I have personally reviewed the images and agree with the above interpretation. Above imaging reviewed with Dr. Felipe Horton.   Assessment and Plan: Todd [name] has no current back pain. His previous mid back pain has improved. He still has leg weakness, right > left. He has frequent falls due to legs giving way.   He has known T12 and  L1 compression fracture along with thoracis spondylosis.   He has weakness in right leg on exam.   Treatment options discussed with patient and following plan made:   - Findings on thoracic MRI would not explain his leg weakness and falling. Dr. Felipe Horton does not think his weakness in spine mediated.  - Pain from compression fractures at T12 and L1 is better. Do not recommend brace (he has pulmonary fibrosis) as it would likely affect his breathing.  - Referral to neurology at [facility] Mason Sole) for second opinion regarding weakness in legs.  - Thoracic MRI showed apparent 5.8 x 1.3 cm T2 hyperintense collection in the posterior right chest wall posterior to second and third ribs. CT of chest with contrast recommended. This is scheduled for next week.  - Will plan phone visit to review results of chest CT. Will determine follow up for compression fractures at that time as well- will likely see in 6 weeks with repeat xrays.   I spent a total of 35 minutes in face-to-face and non-face-to-face activities related to this patient's care today including review of outside records, review of imaging, review of symptoms, physical exam, discussion of differential diagnosis, discussion of treatment options, and documentation.    Lucetta Russel PA-C Dept. of Neurosurgery

## 2024-01-23 ENCOUNTER — Other Ambulatory Visit (HOSPITAL_COMMUNITY): Payer: Self-pay | Admitting: *Deleted

## 2024-01-23 MED ORDER — DOFETILIDE 250 MCG PO CAPS: 250.0000 ug | ORAL_CAPSULE | Freq: Two times a day (BID) | ORAL | 2 refills | Status: DC

## 2024-01-25 NOTE — Addendum Note (Signed)
 Addended by: Babara Lerner on: 01/25/2024 12:36 PM   Modules accepted: Orders

## 2024-01-30 ENCOUNTER — Encounter: Payer: Self-pay | Admitting: Orthopedic Surgery

## 2024-01-30 ENCOUNTER — Ambulatory Visit: Admitting: Orthopedic Surgery

## 2024-01-30 VITALS — BP 116/74 | Ht 68.0 in | Wt 127.0 lb

## 2024-01-30 DIAGNOSIS — S22080D Wedge compression fracture of T11-T12 vertebra, subsequent encounter for fracture with routine healing: Secondary | ICD-10-CM | POA: Diagnosis not present

## 2024-01-30 DIAGNOSIS — S32010D Wedge compression fracture of first lumbar vertebra, subsequent encounter for fracture with routine healing: Secondary | ICD-10-CM

## 2024-01-30 DIAGNOSIS — R29898 Other symptoms and signs involving the musculoskeletal system: Secondary | ICD-10-CM

## 2024-01-30 DIAGNOSIS — M47894 Other spondylosis, thoracic region: Secondary | ICD-10-CM | POA: Diagnosis not present

## 2024-01-30 DIAGNOSIS — S32010A Wedge compression fracture of first lumbar vertebra, initial encounter for closed fracture: Secondary | ICD-10-CM

## 2024-01-30 NOTE — Patient Instructions (Signed)
 It was so nice to see you today. Thank you so much for coming in.    You have a broken bone at T12. You do not need surgery for this. I would avoid bending, twisting, and lifting.   I don't see anything in your spine that would cause the weakness in your legs.   I want you to see neurology at the [facility] clinic for further evaluation- I want you to see Dr. Mason Sole. They should call you to schedule an appointment or you can call them at [phone].   Once I get your CT results back, we can do a phone visit and will regroup.   Please do not hesitate to call if you have any questions or concerns. You can also message me in MyChart.   Lucetta Russel PA-C [phone]     The physicians and staff at [facility] are committed to providing excellent care. You may receive a survey asking for feedback about your experience at our office. We value you your feedback and appreciate you taking the time to to fill it out. The [facility] leadership team is also available to discuss your experience in person, feel free to contact us  [phone].

## 2024-02-05 ENCOUNTER — Telehealth: Payer: Self-pay | Admitting: Cardiology

## 2024-02-05 DIAGNOSIS — I5021 Acute systolic (congestive) heart failure: Secondary | ICD-10-CM | POA: Diagnosis not present

## 2024-02-05 DIAGNOSIS — J9601 Acute respiratory failure with hypoxia: Secondary | ICD-10-CM | POA: Diagnosis not present

## 2024-02-05 NOTE — Telephone Encounter (Signed)
   Pre-operative Risk Assessment    Patient Name: Todd [name]  DOB: 1940/09/05 MRN: [id number]     Request for Surgical Clearance    Procedure:  Dental Extraction - Amount of Teeth to be Pulled:  1  Date of Surgery:  Clearance TBD                                 Surgeon:  Dr Glorine Laroche, DDS Surgeon's Group or Practice Name:  Dr Glorine Laroche Phone number:  [phone] Fax number:  [phone]   Type of Clearance Requested:   - Medical    Type of Anesthesia:  Local    Additional requests/questions:  Pt is in a lot of pain and they are wanted to pull tooth as soon as possible.  Jennine Mohair   02/05/2024, 9:46 AM

## 2024-02-05 NOTE — Telephone Encounter (Signed)
   Patient Name: Todd [name]  DOB: 08-13-1941 MRN: [id number]  [facility] Providers Cardiologist:  Eilleen Grates, MD Electrophysiologist:  Boyce Byes, MD   Chart reviewed as part of pre-operative protocol coverage.   Dental extractions of 1-2 teeth are considered low risk procedures per guidelines and generally do not require any specific cardiac clearance. It is also generally accepted that for extractions of 1-2 teeth and dental cleanings, there is no need to interrupt blood thinner therapy.  SBE prophylaxis is not required for the patient from a cardiac standpoint.  I will route this recommendation to the requesting party via Epic fax function and remove from pre-op pool.  Please call with questions.  Morey Ar, NP 02/05/2024, 10:00 AM

## 2024-02-06 ENCOUNTER — Ambulatory Visit: Admitting: Physician Assistant

## 2024-02-06 DIAGNOSIS — R053 Chronic cough: Secondary | ICD-10-CM | POA: Diagnosis not present

## 2024-02-06 NOTE — Progress Notes (Unsigned)
 Cardiology Office Note:  .   Date:  02/06/2024  ID:  Todd [name], DOB Sep 18, 1940, MRN [id number] PCP: Colene Dauphin, MD  [facility] Providers Cardiologist:  Eilleen Grates, MD Electrophysiologist:  Boyce Byes, MD {  History of Present Illness: .   Todd [name] is a 83 y.o. male w/PMHx of  DM, HTN, HLD, pulmonary fibrosis CAD (CABG) HFmrEF AFib  Referred to Dr. Marven Slimmer Feb 2025 for consideration of watchman, w/concerns of easy bruising, easy bleeding with minor trauma. Planned to proceed >> but seems ultimately deferred  Admitted 5/24 with a fib RVR and elevated troponin that peaked at 5977. Echo 5/24 showed EF 55-60%, no RWMA and mild LVH. LHC showed patent LIMA to LAD, SVG to RCA, and SVG to OM2 with 65% stenosis of the proximal SVG. Elevated trops thought to be demand ischemia 2/2 a fib RVR. Plan was for medical therapy, with consideration of PCI to SVG to OM2 if he had refractory angina. Converted back to NSR with IV diltiazem  and was transitioned to PO metoprolol .   Admitted 2/25 with SOB, Covid, A fib w/ RVR and weakness.  Echo showed EF newly reduced to 45% (previously 55-60%), suspected EF reduction 2/2 a fib   Admitted May 2025 for Tikosyn  load > converted w/drug, required mag (with some GI intol)  Saw the AFib clinic team 01/16/24, GI intolerance/diarrhea of the current mag dosing, maintaining SR, mag dose reduced  Today's visit is scheduled as f/u visit for Tikosyn  load ROS:   He is accompanied by his wife and daughter Diarrhea continues to be very problematic they report 2-4 times a day His wife has not given him any mag since Saturday (today will be his 5th day without) with no improvement yet They have been giving him imodium without help  He has at baseline a tendency towards loose stools that they have been told was his Ofev , but clearly when in the hospital getting the mag got worse When the dose was reduced at his AF clinic visit nothing got  better Generally feeling weak, has lost some weight No bleeding or blood in his stools  No CVP He has not felt any AFib, and does think he would know it if he did  He is unsteady on his feet, this not knew, has weak R leg that they have been seeing neurology and neurosurgery about > has been frustrating  Yesterday found with a dental infection and started on on amoxicillin  (has taken one dose)  No near syncope or syncope No SOB  His daughter thinks he is getting depressed   Arrhythmia/AAD hx Tikosyn  started May 2025  Studies Reviewed: Aaron Aas    EKG done today and reviewed by myself:  SR 90bpm, motion artifact, QTc   10/18/13: TTE 1. Left ventricular ejection fraction, by estimation, is 45%. The left  ventricle has mildly decreased function. The left ventricle demonstrates  global hypokinesis. There is mild concentric left ventricular hypertrophy.  Left ventricular diastolic  parameters are indeterminate.   2. Right ventricular systolic function is mildly reduced. The right  ventricular size is mildly enlarged. There is normal pulmonary artery  systolic pressure. The estimated right ventricular systolic pressure is  33.3 mmHg.   3. Left atrial size was mildly dilated.   4. The mitral valve is degenerative. Mild mitral valve regurgitation. No  evidence of mitral stenosis. Moderate mitral annular calcification.   5. The aortic valve is tricuspid. There is moderate calcification of the  aortic valve. Aortic valve regurgitation is not visualized. Aortic valve  sclerosis/calcification is present, without any evidence of aortic  stenosis.   6. The inferior vena cava is dilated in size with <50% respiratory  variability, suggesting right atrial pressure of 15 mmHg.   7. The patient was in atrial fibrillation.    01/08/23: LHC   Ost Cx to Mid Cx lesion is 70% stenosed.   1st Mrg lesion is 100% stenosed.   3rd Mrg lesion is 100% stenosed.   Prox LAD to Mid LAD lesion is 100%  stenosed.   Ost RCA to Prox RCA lesion is 100% stenosed.   Dist LAD lesion is 70% stenosed.   Origin to Prox Graft lesion is 65% stenosed.   LIMA graft was visualized by angiography and is large.   SVG graft was visualized by angiography and is normal in caliber.   The graft exhibits no disease.   LV end diastolic pressure is normal.   3 vessel occlusive CAD.  Patent LIMA to the LAD. The are some collaterals to a first OM Patent SVG to the second OM. There is a smooth moderate stenosis in the proximal SVG 65-70%. Patent SVG to the RCA Normal LVEDP   Plan: there is a discrepancy between chart records and what we are seeing on cath. No old Bypass note available. Chart records indicate there were SVG to OM1, SVG to OM2, SVG to RCA and LIMA to LAD. In fact the patient did have a radial graft used. There are only 2 graft markers in the aorta. The graft to the OMs may have been a sequential graft but now only OM2 supplied. There is modest disease in the SVG to OM but this does not appear like an acute lesion and I suspect his current presentation is due to demand ischemia in setting of AFib with RVR. If he has refractory angina despite optimal management of his arrhythmia we could consider PCI of the SVG to OM.    Risk Assessment/Calculations:    Physical Exam:   VS:  There were no vitals taken for this visit.   Wt Readings from Last 3 Encounters:  01/30/24 127 lb (57.6 kg)  01/16/24 127 lb 9.6 oz (57.9 kg)  01/01/24 131 lb 12.8 oz (59.8 kg)    GEN: Well nourished, well developed in no acute distress NECK: No JVD; No carotid bruits CARDIAC: RRR, no murmurs, rubs, gallops RESPIRATORY:  CTA b/l without rales, wheezing or rhonchi  ABDOMEN: Soft, non-tender, non-distended EXTREMITIES:  1+ edema to just above the ankles b/l; No deformity   ASSESSMENT AND PLAN: .    persistent AFib CHA2DS2Vasc is 6, on Xarelto , lab today > may need to reduce dose Tikosyn  w/stable QTc Low/no burden by  symptoms   Discussed medication interactions, tikosyn  teaching re-enforced. Advised to stop imodium, reminded to check with us , pharmacist, AFib clinic with anything new even OTC, and that much of the GI symptom meds especially he can not take  Spoke to our [facility] in clinic Mag should be out of his system and if diarrhea was the mag > should be gone/improving at least Advised fiber, metamucil type efforts He is now on amoxil  which may cause GI issues (though does not explain the last 3-4 weeks) > may cause ongoing GI issues  Discussed that they need to talk with his PMD about other causes of his diarrhea as and any other evaluation that may be needed Also discussed that there are some meds for depression  that would be OK with Tikosyn  should they need to go that route  Advised to stay well hydrated Labs today Xarelto  dose may need to be changed Would likely tolerate Mag ~1.8 or so for Tikosyn  if about her off replacement His p.HTN makes him a poor amio candidate I think  CAD No symptoms C/w Dr. 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

## 2024-02-06 NOTE — Telephone Encounter (Signed)
 Office called to say they haven't received the fax. Please advise

## 2024-02-06 NOTE — Telephone Encounter (Signed)
 I will re-fax clearance notes to DDS office.

## 2024-02-07 ENCOUNTER — Ambulatory Visit: Attending: Physician Assistant | Admitting: Physician Assistant

## 2024-02-07 ENCOUNTER — Encounter: Payer: Self-pay | Admitting: Internal Medicine

## 2024-02-07 VITALS — BP 118/74 | HR 90 | Ht 68.0 in | Wt 122.0 lb

## 2024-02-07 DIAGNOSIS — Z5181 Encounter for therapeutic drug level monitoring: Secondary | ICD-10-CM

## 2024-02-07 DIAGNOSIS — Z79899 Other long term (current) drug therapy: Secondary | ICD-10-CM | POA: Diagnosis not present

## 2024-02-07 DIAGNOSIS — I2510 Atherosclerotic heart disease of native coronary artery without angina pectoris: Secondary | ICD-10-CM | POA: Diagnosis not present

## 2024-02-07 DIAGNOSIS — I428 Other cardiomyopathies: Secondary | ICD-10-CM | POA: Diagnosis not present

## 2024-02-07 DIAGNOSIS — I4819 Other persistent atrial fibrillation: Secondary | ICD-10-CM

## 2024-02-07 DIAGNOSIS — D6869 Other thrombophilia: Secondary | ICD-10-CM | POA: Diagnosis not present

## 2024-02-07 NOTE — Patient Instructions (Signed)
 Medication Instructions:    Your physician recommends that you continue on your current medications as directed. Please refer to the Current Medication list given to you today.   *If you need a refill on your cardiac medications before your next appointment, please call your pharmacy*   Lab Work:   PLEASE GO DOWN STAIRS  [facility]  FIRST FLOOR   ( GET OFF ELEVATORS WALK TOWARDS WAITING AREA LAB LOCATED BY PHARMACY):  BMET AND  MAG TODAY      If you have labs (blood work) drawn today and your tests are completely normal, you will receive your results only by: MyChart Message (if you have MyChart) OR A paper copy in the mail If you have any lab test that is abnormal or we need to change your treatment, we will call you to review the results.   Testing/Procedures: NONE ORDERED  TODAY    Follow-Up: At [facility], you and your health needs are our priority.  As part of our continuing mission to provide you with exceptional heart care, our providers are all part of one team.  This team includes your primary Cardiologist (physician) and Advanced Practice Providers or APPs (Physician Assistants and Nurse Practitioners) who all work together to provide you with the care you need, when you need it.  Your next appointment:    3 week(s) IN [place]  ( CONTACT  CASSIE HALL/ ANGELINE HAMMER FOR EP SCHEDULING ISSUES )   Provider:    You may see Boyce Byes, MD or one of the following Advanced Practice Providers on your designated Care Team:   Suzann Riddle, NP    We recommend signing up for the patient portal called MyChart.  Sign up information is provided on this After Visit Summary.  MyChart is used to connect with patients for Virtual Visits (Telemedicine).  Patients are able to view lab/test results, encounter notes, upcoming appointments, etc.  Non-urgent messages can be sent to your provider as well.   To learn more about what you can do with MyChart, go to  [email or web address].   Other Instructions

## 2024-02-08 ENCOUNTER — Ambulatory Visit: Payer: Self-pay | Admitting: Physician Assistant

## 2024-02-08 DIAGNOSIS — Z79899 Other long term (current) drug therapy: Secondary | ICD-10-CM

## 2024-02-08 LAB — BASIC METABOLIC PANEL WITH GFR
BUN/Creatinine Ratio: 37 — ABNORMAL HIGH (ref 10–24)
BUN: 34 mg/dL — ABNORMAL HIGH (ref 8–27)
CO2: 23 mmol/L (ref 20–29)
Calcium: 9.3 mg/dL (ref 8.6–10.2)
Chloride: 100 mmol/L (ref 96–106)
Creatinine, Ser: 0.92 mg/dL (ref 0.76–1.27)
Glucose: 210 mg/dL — ABNORMAL HIGH (ref 70–99)
Potassium: 4.3 mmol/L (ref 3.5–5.2)
Sodium: 142 mmol/L (ref 134–144)
eGFR: 84 mL/min/{1.73_m2} (ref >59)

## 2024-02-08 LAB — MAGNESIUM: Magnesium: 1.6 mg/dL (ref 1.6–2.3)

## 2024-02-09 MED ORDER — FREESTYLE LIBRE 3 PLUS SENSOR MISC: 3 refills | Status: DC

## 2024-02-11 ENCOUNTER — Ambulatory Visit
Admission: RE | Admit: 2024-02-11 | Discharge: 2024-02-11 | Disposition: A | Discharge status: Home / Self Care | Source: Ambulatory Visit | Attending: Orthopedic Surgery | Admitting: Orthopedic Surgery

## 2024-02-11 DIAGNOSIS — S22080A Wedge compression fracture of T11-T12 vertebra, initial encounter for closed fracture: Secondary | ICD-10-CM

## 2024-02-11 MED ORDER — IOHEXOL 300 MG/ML  SOLN: 75.0000 mL | Freq: Once | INTRAMUSCULAR | Status: DC | PRN

## 2024-02-11 MED ORDER — DULOXETINE HCL 20 MG PO CPEP: 20.0000 mg | ORAL_CAPSULE | Freq: Every day | ORAL | 5 refills | Status: DC

## 2024-02-11 NOTE — Telephone Encounter (Signed)
 Patient showed up at [facility] for the CT contrast. [place] CT tech said that the patient is allergic to the dye and no one had prescribed the allergy prep. The ordering physician unsually prescribes the prep. It is 3 pills of 50 mg prednisone . 1st pill 13 hours before scan, 2nd pill 7 hours before scan, last pill 1 hour before scan along with 50mg  tab of Benadryl . They will reschedule his CT scan.

## 2024-02-11 NOTE — Telephone Encounter (Signed)
 Turkey from [facility] called back. She spoke to the daughter and explained to her that since he is allergic to the contract he needs to do an allergy prep. The daughter refused the prep because the patient almost died last month taking prednisone . Can you order the CT w/out contrast? [place] states that the patient's daughter was very upset.

## 2024-02-14 ENCOUNTER — Telehealth: Payer: Self-pay | Admitting: Internal Medicine

## 2024-02-14 NOTE — Telephone Encounter (Signed)
 Okay for orders?

## 2024-02-14 NOTE — Telephone Encounter (Signed)
 Copied from CRM [id number]. Topic: Clinical - Home Health Verbal Orders >> Feb 14, 2024 11:59 AM Chiquita SQUIBB wrote: Caller/Agency: [facility] Callback Number: [phone] - secure line ok to leave a voicemail  Service Requested: Physical Therapy  Frequency: 1 week 2  Any new concerns about the patient? No

## 2024-02-15 ENCOUNTER — Other Ambulatory Visit: Payer: Self-pay | Admitting: Physician Assistant

## 2024-02-15 DIAGNOSIS — R9389 Abnormal findings on diagnostic imaging of other specified body structures: Secondary | ICD-10-CM

## 2024-02-15 DIAGNOSIS — J189 Pneumonia, unspecified organism: Secondary | ICD-10-CM

## 2024-02-15 NOTE — Telephone Encounter (Signed)
 Verbals given today.

## 2024-02-15 NOTE — Telephone Encounter (Signed)
 And to confirm the MRI chest with contrast will be ok to proceed with since he has a contrast allergy AND can't take prednisone ?  I just want to double check before we notify the daughter/patient so that we don't run into the same problem as we did for the CT.

## 2024-02-20 ENCOUNTER — Encounter: Payer: Self-pay | Admitting: Internal Medicine

## 2024-02-20 ENCOUNTER — Other Ambulatory Visit: Payer: Self-pay

## 2024-02-20 DIAGNOSIS — R634 Abnormal weight loss: Secondary | ICD-10-CM

## 2024-02-20 DIAGNOSIS — R195 Other fecal abnormalities: Secondary | ICD-10-CM

## 2024-02-26 ENCOUNTER — Ambulatory Visit: Admission: RE | Admit: 2024-02-26 | Source: Ambulatory Visit

## 2024-02-26 DIAGNOSIS — I4891 Unspecified atrial fibrillation: Secondary | ICD-10-CM

## 2024-02-26 DIAGNOSIS — I10 Essential (primary) hypertension: Secondary | ICD-10-CM

## 2024-02-26 DIAGNOSIS — I4819 Other persistent atrial fibrillation: Secondary | ICD-10-CM

## 2024-02-26 DIAGNOSIS — R5383 Other fatigue: Secondary | ICD-10-CM

## 2024-02-28 ENCOUNTER — Ambulatory Visit: Admitting: Gastroenterology

## 2024-02-28 ENCOUNTER — Other Ambulatory Visit

## 2024-02-28 ENCOUNTER — Inpatient Hospital Stay: Admission: RE | Admit: 2024-02-28 | Source: Ambulatory Visit

## 2024-03-04 ENCOUNTER — Telehealth: Payer: Self-pay | Admitting: Internal Medicine

## 2024-03-04 DIAGNOSIS — R197 Diarrhea, unspecified: Secondary | ICD-10-CM

## 2024-03-04 NOTE — Telephone Encounter (Signed)
 Inbound call from patient daughter stating patient has been having frequent, uncontrollable loose stools. States patient is progressively losing weight and now weighs 117. Also states his stool is changing colors. Patient was previously scheduled for 7/10 but daughter stated he was not moving well when they tried to come. Has been rescheduled to 8/22. Daughter requesting a call to discuss sooner recommendations. Please advise, thank you,

## 2024-03-04 NOTE — Telephone Encounter (Signed)
 Verified medications with daughter, she reports that patient is still on the Ofev  and Metformin  though they have decreased his dose of Metformin  to 1 tab BID. She agrees to stool studies. Orders placed. She will pick up containers tomorrow.

## 2024-03-04 NOTE — Telephone Encounter (Signed)
 Spoke with patient's daughter. She reports that patient has been dealing with long term diarrhea for a while now. Patient is on Tikosyn  and was taking Magnesium  because of this. She reports he has been off of the Magnesium  for 2-3 weeks now and the diarrhea has not improved. Patient has lost weight, he is now down to 117 pounds, weighed about 160 a year ago. Patient saw Dr. Avram and was sent to surgeon due to a prolapsed hemorrhoid. Per daughter they did not return to see the surgeon, because he basically told dad to just drink a bunch of water, and you can pop it back in place. Patient is now dealing with incontinence and at times has no idea that he is having a bowel movement. Daughter does report that the stool varies in color at times and has been orange to yellow at times with a foul odor. No blood noted. Patient is not able to take Imodium due to being on Tikosyn . Sooner appointment scheduled for 7/31 at 1020, but the daughter would like to know what else can be done in the meantime.

## 2024-03-04 NOTE — Progress Notes (Unsigned)
 Cardiology Office Note:   Date:  03/06/2024  ID:  Lynwood SAUNDERS Geffre, DOB 1941-08-21, MRN [id number] PCP: Geofm Glade PARAS, MD  [facility] Providers Cardiologist:  Lynwood Schilling, MD Electrophysiologist:  OLE ONEIDA HOLTS, MD {  History of Present Illness:   Todd [name] is a 83 y.o. male with a history of  CABG x4 and paroxysmal atrial fibrillation. He has LIMA to LAD, SVG to OM1, SVG to OM2, SVG to RCA. He was recently admitted from 01/07/2023 to 01/09/2023 for atrial fibrillation with RVR after presenting with chest pressure and was found to have an elevated troponin. High-sensitivity troponin peaked at 5,977. Echo showed LVEF of 55-60% with no regional wall motion abnormalities and mild LVH. He underwent LHC which showed patent LIMA to LAD, SVG to RCA, and SVG to OM2 with 65% stenosis of the proximal SVG. Elevated troponin was felt to be due to demand ischemia in the setting of rapid atrial fibrillation rather than true ACS. Continued medical therapy was recommended with consideration of PCI to SVG to OM2 if he has refractory angina. He was placed IV Diltiazem  and converted back to sinus rhythm and then was transitioned back to PO Metoprolol .   He has had persistent atrial fib and was treated with Tikosyn .  He was having increased leg edema.  EF was 45% on echo in Feb 2025.  The EF was felt to be down because of recurrent atrial fib.  He was managed with rate control.  He was COVID positive at the time.  He was admitted for Tikosyn .  Unfortunately he is continued to get weaker and lose weight.  Now he is battling diarrhea and is actually sent in stool today for C. difficile.  He was on magnesium  because he was a low and on Tikosyn  and they stopped this but his diarrhea has not gotten any better.  Just today he stopped his metformin .  Unfortunately the diarrhea continues.  He is still in sinus rhythm by exam.  He actually thinks his breathing is better.  He is having less cough.  He is just having to  get around with a walker and in a wheelchair.  He has a lot of back pain.  He has muscle wasting.  He is not having new chest pressure, neck or arm discomfort.  He is not having any new palpitations.  Lower extremity swelling is improved.  ROS: Yeast infection.  Studies Reviewed:    EKG:     NA  Risk Assessment/Calculations:    CHA2DS2-VASc Score = 6   This indicates a 9.7% annual risk of stroke. The patient's score is based upon: CHF History: 1 HTN History: 1 Diabetes History: 1 Stroke History: 0 Vascular Disease History: 1 Age Score: 2 Gender Score: 0   Physical Exam:   VS:  BP 122/81 (BP Location: Left Arm, Patient Position: Sitting, Cuff Size: Normal)   Pulse 84   Resp 18   Ht 5' 8 (1.727 m)   Wt 117 lb (53.1 kg)   SpO2 98%   BMI 17.79 kg/m    Wt Readings from Last 3 Encounters:  03/06/24 117 lb (53.1 kg)  02/07/24 122 lb (55.3 kg)  01/30/24 127 lb (57.6 kg)     GEN:   Very frail appearing and chronically ill but in no distress NECK: No JVD; No carotid bruits CARDIAC: RRR, no murmurs, rubs, gallops RESPIRATORY:  Clear to auscultation without rales, wheezing or rhonchi  ABDOMEN: Soft, non-tender, non-distended EXTREMITIES: Mild leg  edema; No deformity   ASSESSMENT AND PLAN:   Persistent atrial fibrillation:   He seems to be in sinus rhythm on the Tikosyn .  I am going to check a magnesium  and a potassium today.  If his magnesium  is low he is going to have to start it back.  It does not seem to be exacerbating his diarrhea.  If he cannot take magnesium  he might not be able to take Tikosyn .  He does probably feel better in sinus rhythm however.  Otherwise he will continue the meds as listed and in particular he seems to be tolerating his anticoagulation.   Acute systolic heart failure: EF was 45% on echocardiogram 10/11/2023.  He seems to be euvolemic.  No change in therapy.  His frailty would not allow med titration.  Lower extremity edema: This is improved.  No  change in therapy.     CAD s/p CABG 1999:   He has had no new symptoms since his last catheterization in 2024.    History of pulmonary fibrosis:   He sees Dr. Theophilus soon and will continue the meds as listed.    Hypertension: No change in therapy.  His blood pressure has been running on the low side.     Hyperlipidemia :   LDL was 51 with an HDL 43.  No change in therapy.   DM2: A1c was 8.6.  However, he is having some yeast infections and I sent a note to Dr. Geofm to see if we should discontinue the [name]  although he would not be on any therapy at that point.      Follow up with me in about 4 months.  Signed, Lynwood Schilling, MD

## 2024-03-06 ENCOUNTER — Encounter: Payer: Self-pay | Admitting: Cardiology

## 2024-03-06 ENCOUNTER — Ambulatory Visit: Attending: Cardiology | Admitting: Cardiology

## 2024-03-06 ENCOUNTER — Other Ambulatory Visit

## 2024-03-06 VITALS — BP 122/81 | HR 84 | Resp 18 | Ht 68.0 in | Wt 117.0 lb

## 2024-03-06 DIAGNOSIS — R197 Diarrhea, unspecified: Secondary | ICD-10-CM | POA: Diagnosis not present

## 2024-03-06 DIAGNOSIS — E118 Type 2 diabetes mellitus with unspecified complications: Secondary | ICD-10-CM

## 2024-03-06 DIAGNOSIS — I2510 Atherosclerotic heart disease of native coronary artery without angina pectoris: Secondary | ICD-10-CM | POA: Diagnosis not present

## 2024-03-06 DIAGNOSIS — I10 Essential (primary) hypertension: Secondary | ICD-10-CM

## 2024-03-06 DIAGNOSIS — E785 Hyperlipidemia, unspecified: Secondary | ICD-10-CM | POA: Diagnosis not present

## 2024-03-06 DIAGNOSIS — I5021 Acute systolic (congestive) heart failure: Secondary | ICD-10-CM

## 2024-03-06 DIAGNOSIS — I4819 Other persistent atrial fibrillation: Secondary | ICD-10-CM | POA: Diagnosis not present

## 2024-03-06 NOTE — Patient Instructions (Signed)
 Medication Instructions:  Your physician recommends that you continue on your current medications as directed. Please refer to the Current Medication list given to you today.  *If you need a refill on your cardiac medications before your next appointment, please call your pharmacy*  Lab Work: BMET, Magnesium  today If you have labs (blood work) drawn today and your tests are completely normal, you will receive your results only by: MyChart Message (if you have MyChart) OR A paper copy in the mail If you have any lab test that is abnormal or we need to change your treatment, we will call you to review the results.  Testing/Procedures: NONE  Follow-Up: At [facility], you and your health needs are our priority.  As part of our continuing mission to provide you with exceptional heart care, our providers are all part of one team.  This team includes your primary Cardiologist (physician) and Advanced Practice Providers or APPs (Physician Assistants and Nurse Practitioners) who all work together to provide you with the care you need, when you need it.  Your next appointment:   4 month(s)  Provider:   Lynwood Schilling, MD    We recommend signing up for the patient portal called MyChart.  Sign up information is provided on this After Visit Summary.  MyChart is used to connect with patients for Virtual Visits (Telemedicine).  Patients are able to view lab/test results, encounter notes, upcoming appointments, etc.  Non-urgent messages can be sent to your provider as well.   To learn more about what you can do with MyChart, go to [email or web address].

## 2024-03-07 ENCOUNTER — Other Ambulatory Visit (HOSPITAL_COMMUNITY): Payer: Self-pay

## 2024-03-07 ENCOUNTER — Telehealth: Payer: Self-pay | Admitting: Cardiology

## 2024-03-07 ENCOUNTER — Ambulatory Visit: Payer: Self-pay | Admitting: Cardiology

## 2024-03-07 ENCOUNTER — Encounter: Payer: Self-pay | Admitting: Internal Medicine

## 2024-03-07 DIAGNOSIS — I5022 Chronic systolic (congestive) heart failure: Secondary | ICD-10-CM

## 2024-03-07 DIAGNOSIS — I2510 Atherosclerotic heart disease of native coronary artery without angina pectoris: Secondary | ICD-10-CM | POA: Diagnosis not present

## 2024-03-07 DIAGNOSIS — E1151 Type 2 diabetes mellitus with diabetic peripheral angiopathy without gangrene: Secondary | ICD-10-CM | POA: Diagnosis not present

## 2024-03-07 DIAGNOSIS — R5381 Other malaise: Secondary | ICD-10-CM

## 2024-03-07 DIAGNOSIS — I5020 Unspecified systolic (congestive) heart failure: Secondary | ICD-10-CM | POA: Diagnosis not present

## 2024-03-07 DIAGNOSIS — Z794 Long term (current) use of insulin: Secondary | ICD-10-CM | POA: Diagnosis not present

## 2024-03-07 DIAGNOSIS — J8410 Pulmonary fibrosis, unspecified: Secondary | ICD-10-CM

## 2024-03-07 DIAGNOSIS — I4819 Other persistent atrial fibrillation: Secondary | ICD-10-CM

## 2024-03-07 DIAGNOSIS — I110 Hypertensive heart disease with heart failure: Secondary | ICD-10-CM | POA: Diagnosis not present

## 2024-03-07 DIAGNOSIS — G8929 Other chronic pain: Secondary | ICD-10-CM

## 2024-03-07 DIAGNOSIS — Z7983 Long term (current) use of bisphosphonates: Secondary | ICD-10-CM | POA: Diagnosis not present

## 2024-03-07 DIAGNOSIS — I480 Paroxysmal atrial fibrillation: Secondary | ICD-10-CM | POA: Diagnosis not present

## 2024-03-07 DIAGNOSIS — L89151 Pressure ulcer of sacral region, stage 1: Secondary | ICD-10-CM

## 2024-03-07 DIAGNOSIS — S22080A Wedge compression fracture of T11-T12 vertebra, initial encounter for closed fracture: Secondary | ICD-10-CM

## 2024-03-07 DIAGNOSIS — J849 Interstitial pulmonary disease, unspecified: Secondary | ICD-10-CM | POA: Diagnosis not present

## 2024-03-07 DIAGNOSIS — R1312 Dysphagia, oropharyngeal phase: Secondary | ICD-10-CM | POA: Diagnosis not present

## 2024-03-07 DIAGNOSIS — E785 Hyperlipidemia, unspecified: Secondary | ICD-10-CM | POA: Diagnosis not present

## 2024-03-07 DIAGNOSIS — Z8616 Personal history of COVID-19: Secondary | ICD-10-CM | POA: Diagnosis not present

## 2024-03-07 LAB — BASIC METABOLIC PANEL WITH GFR
BUN/Creatinine Ratio: 64 — ABNORMAL HIGH (ref 10–24)
BUN: 50 mg/dL — ABNORMAL HIGH (ref 8–27)
CO2: 20 mmol/L (ref 20–29)
Calcium: 9.9 mg/dL (ref 8.6–10.2)
Chloride: 101 mmol/L (ref 96–106)
Creatinine, Ser: 0.78 mg/dL (ref 0.76–1.27)
Glucose: 114 mg/dL — ABNORMAL HIGH (ref 70–99)
Potassium: 4.8 mmol/L (ref 3.5–5.2)
Sodium: 139 mmol/L (ref 134–144)
eGFR: 90 mL/min/1.73 (ref >59)

## 2024-03-07 LAB — MAGNESIUM: Magnesium: 1.7 mg/dL (ref 1.6–2.3)

## 2024-03-07 NOTE — Telephone Encounter (Signed)
 FYI Daughter called and cx appt with Dr Cindie for 7/23. Daughter said pt just saw Dr Lavona on 7/17 and told him he was doing good. So she doesn't feel like he needs appt with Dr Cindie

## 2024-03-08 LAB — CLOSTRIDIUM DIFFICILE BY PCR: Toxigenic C. Difficile by PCR: NEGATIVE

## 2024-03-10 ENCOUNTER — Other Ambulatory Visit

## 2024-03-10 ENCOUNTER — Ambulatory Visit
Admission: RE | Admit: 2024-03-10 | Discharge: 2024-03-10 | Disposition: A | Discharge status: Home / Self Care | Source: Ambulatory Visit | Attending: Pulmonary Disease | Admitting: Pulmonary Disease

## 2024-03-10 DIAGNOSIS — G6289 Other specified polyneuropathies: Secondary | ICD-10-CM | POA: Diagnosis not present

## 2024-03-10 DIAGNOSIS — J8410 Pulmonary fibrosis, unspecified: Secondary | ICD-10-CM | POA: Diagnosis not present

## 2024-03-10 DIAGNOSIS — J939 Pneumothorax, unspecified: Secondary | ICD-10-CM | POA: Diagnosis not present

## 2024-03-10 DIAGNOSIS — I482 Chronic atrial fibrillation, unspecified: Secondary | ICD-10-CM | POA: Diagnosis not present

## 2024-03-10 DIAGNOSIS — R29898 Other symptoms and signs involving the musculoskeletal system: Secondary | ICD-10-CM | POA: Diagnosis not present

## 2024-03-10 DIAGNOSIS — R627 Adult failure to thrive: Secondary | ICD-10-CM | POA: Diagnosis not present

## 2024-03-10 DIAGNOSIS — M6250 Muscle wasting and atrophy, not elsewhere classified, unspecified site: Secondary | ICD-10-CM | POA: Diagnosis not present

## 2024-03-10 DIAGNOSIS — J849 Interstitial pulmonary disease, unspecified: Secondary | ICD-10-CM

## 2024-03-10 DIAGNOSIS — K529 Noninfective gastroenteritis and colitis, unspecified: Secondary | ICD-10-CM | POA: Diagnosis not present

## 2024-03-10 LAB — STOOL CULTURE: E coli, Shiga toxin Assay: NEGATIVE

## 2024-03-10 NOTE — Telephone Encounter (Signed)
-----   Message from Lynwood Schilling sent at 03/07/2024  7:48 AM EDT ----- He needs to restart his previous dose of magnesium  and get a repeat mag level in two weeks.   Call Mr. Duval with the results and send results to Geofm Glade PARAS, MD  ----- Message ----- From: Interface, [facility] Lab Results In Sent: 03/07/2024   3:35 AM EDT To: Lynwood Schilling, MD

## 2024-03-10 NOTE — Telephone Encounter (Signed)
 Spoke with pt's daughter per [facility] regarding his results. Daughter aware to increase Mag and get lab draw in 2 weeks. Labs ordered and released. Daughter verbalized understanding. All questions if any were answered. Results sent to PCP

## 2024-03-11 ENCOUNTER — Encounter (HOSPITAL_COMMUNITY): Payer: Self-pay | Admitting: *Deleted

## 2024-03-11 ENCOUNTER — Other Ambulatory Visit: Payer: Self-pay | Admitting: *Deleted

## 2024-03-11 ENCOUNTER — Ambulatory Visit: Payer: Self-pay | Admitting: Gastroenterology

## 2024-03-11 ENCOUNTER — Telehealth: Payer: Self-pay

## 2024-03-11 DIAGNOSIS — J849 Interstitial pulmonary disease, unspecified: Secondary | ICD-10-CM

## 2024-03-11 DIAGNOSIS — J9311 Primary spontaneous pneumothorax: Secondary | ICD-10-CM

## 2024-03-11 DIAGNOSIS — R053 Chronic cough: Secondary | ICD-10-CM

## 2024-03-11 MED ORDER — AMOXICILLIN 500 MG PO TABS: 500.0000 mg | ORAL_TABLET | Freq: Three times a day (TID) | ORAL | 0 refills | Status: AC

## 2024-03-11 MED ORDER — CIPROFLOXACIN HCL 500 MG PO TABS: 500.0000 mg | ORAL_TABLET | Freq: Two times a day (BID) | ORAL | 0 refills | Status: AC

## 2024-03-11 NOTE — Telephone Encounter (Signed)
 Patient daughter called and stated that she has a couple of question regarding what medication her father is able to take since her father is positive for Salmonella. Patient is requesting a call back. Please advise.

## 2024-03-11 NOTE — Telephone Encounter (Signed)
 I called and discussed with patient's daughter.  Patient has chronic dyspnea with no significant worsening compared to prior Sats are 98% on room air  Suspect the pneumomediastinum and small pneumothorax is secondary to coughing I have asked him to come in for an x-ray in the next 2 days for reassessment Advised that if he has any acute worsening of dyspnea or desaturation then he needs to seek help at the nearest emergency room.  Please cancel PFTs that have been scheduled for 7/30 and keep their office appointment  Kennedie Pardoe MD [facility] See Amion for pager  If no response to pager , please call [phone] until 7pm After 7:00 pm call Elink  [phone] 03/11/2024, 5:15 PM

## 2024-03-11 NOTE — Telephone Encounter (Signed)
 Call report on HRCT from 03/10/24   Impression:  IMPRESSION: 1. Interval worsening of moderate to severe pulmonary fibrosis with apical to basal gradient, featuring irregular peripheral interstitial opacity, septal thickening, extensive traction bronchiectasis, subpleural bronchiolectasis, and large areas of honeycombing throughout the lung bases. Honeycombing is particularly increased in the lung bases when compared to both immediate prior examination and more remote prior examinations. 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 Am JINNY Honey Crit Care Med Vol 198, Iss 5, [phone], Apr 21 2017. 2. Tiny, less than 5% left pneumothorax. 3. Extensive pneumomediastinum and lower cervical emphysema. 4. No evident etiology for the above findings, possibly secondary to cough in the absence of procedure or trauma. 5. Cardiomegaly and coronary artery disease. 6. Cholelithiasis. 7. Nonobstructive left nephrolithiasis.   These results will be called to the ordering clinician or representative by the Radiologist Assistant, and communication documented in the PACS or [employer].   Aortic Atherosclerosis (ICD10-I70.0) and Emphysema (ICD10-J43.9).     Electronically Signed   By: Marolyn JONETTA Jaksch M.D.  Routing to Dr. Theophilus as urgent   On: 03/11/2024 16:44

## 2024-03-11 NOTE — Telephone Encounter (Signed)
 E2C2 called with a radiologist on the line asking to speak with a CMA. I transferred the call to [facility].

## 2024-03-12 ENCOUNTER — Ambulatory Visit: Admitting: Cardiology

## 2024-03-12 ENCOUNTER — Encounter: Payer: Self-pay | Admitting: Internal Medicine

## 2024-03-12 MED ORDER — CLOTRIMAZOLE 1 % EX CREA: 1.0000 | TOPICAL_CREAM | Freq: Two times a day (BID) | CUTANEOUS | 2 refills | Status: DC

## 2024-03-12 NOTE — Addendum Note (Signed)
 Addended by: GEOFM GLADE PARAS on: 03/12/2024 05:38 AM   Modules accepted: Orders

## 2024-03-12 NOTE — Telephone Encounter (Signed)
 PFT has been cancelled  OV remains

## 2024-03-14 ENCOUNTER — Ambulatory Visit

## 2024-03-14 ENCOUNTER — Telehealth: Payer: Self-pay | Admitting: Pulmonary Disease

## 2024-03-14 DIAGNOSIS — J9311 Primary spontaneous pneumothorax: Secondary | ICD-10-CM

## 2024-03-14 DIAGNOSIS — R053 Chronic cough: Secondary | ICD-10-CM | POA: Diagnosis not present

## 2024-03-14 DIAGNOSIS — J8410 Pulmonary fibrosis, unspecified: Secondary | ICD-10-CM | POA: Diagnosis not present

## 2024-03-14 DIAGNOSIS — J849 Interstitial pulmonary disease, unspecified: Secondary | ICD-10-CM | POA: Diagnosis not present

## 2024-03-14 NOTE — Telephone Encounter (Signed)
 PT's daughter wouldl ike to come in Thursday with her Dad instead of Wednesday. It's very hard for him to get out. Can Dr. Theophilus barrows us  to change his appt to Thursday using one of the same Day slots. Earlier if possible. Please let front know or call daughter at [phone]

## 2024-03-17 NOTE — Telephone Encounter (Signed)
 It is fine to shift his appointment to Thursday and use a same-day appointment

## 2024-03-19 ENCOUNTER — Encounter

## 2024-03-19 ENCOUNTER — Ambulatory Visit: Admitting: Pulmonary Disease

## 2024-03-20 ENCOUNTER — Ambulatory Visit (INDEPENDENT_AMBULATORY_CARE_PROVIDER_SITE_OTHER)

## 2024-03-20 ENCOUNTER — Ambulatory Visit: Admitting: Gastroenterology

## 2024-03-20 ENCOUNTER — Telehealth: Payer: Self-pay | Admitting: Pharmacy Technician

## 2024-03-20 ENCOUNTER — Ambulatory Visit: Admitting: Pulmonary Disease

## 2024-03-20 ENCOUNTER — Encounter: Payer: Self-pay | Admitting: Gastroenterology

## 2024-03-20 ENCOUNTER — Telehealth: Payer: Self-pay | Admitting: Pulmonary Disease

## 2024-03-20 VITALS — BP 112/68 | HR 71 | Ht 68.0 in | Wt 117.0 lb

## 2024-03-20 VITALS — BP 102/86 | HR 74 | Temp 98.0°F | Ht 68.0 in | Wt 117.0 lb

## 2024-03-20 DIAGNOSIS — J939 Pneumothorax, unspecified: Secondary | ICD-10-CM | POA: Diagnosis not present

## 2024-03-20 DIAGNOSIS — R197 Diarrhea, unspecified: Secondary | ICD-10-CM | POA: Diagnosis not present

## 2024-03-20 DIAGNOSIS — J849 Interstitial pulmonary disease, unspecified: Secondary | ICD-10-CM

## 2024-03-20 DIAGNOSIS — J982 Interstitial emphysema: Secondary | ICD-10-CM | POA: Diagnosis not present

## 2024-03-20 MED ORDER — CHOLESTYRAMINE 4 G PO PACK
4.0000 g | PACK | Freq: Every day | ORAL | 3 refills | Status: DC
Start: 2024-03-20 — End: 2024-04-28

## 2024-03-20 NOTE — Progress Notes (Signed)
 Todd [name]    [id number]    [birth year]  Primary Care Physician:Burns, Glade PARAS, MD  Referring Physician: Geofm Glade PARAS, MD [address] [place],  [place] [address]  Problem list: Follow up for cough Progressive pulmonary fibrosis, Asbestosis Post COVID-19 in September 2021 Esbriet  attempted in 2023 but stopped due to side effects Started Ofev  December 2023  HPI: Todd [name] is a 83 y.o. with history of allergies, rhinitis, atrial fibrillation, hypertension, hyperlipidemia, diabetes, GERD Follow-up for asbestosis, chronic cough, COVID-19 infection in September 2021  Developed COVID-19 in September 2021.  He was sick for about a week.  Treated with outpatient monoclonal antibody therapy and did not require hospitalization His cough has worsened since his COVID-19 infection.  He was put on prednisone  for a month with slow taper with improvement in symptoms Continues to have persistent cough.  ENT examination in 2022 was normal  Started Esbriet  in January 2023 for progressive pulmonary fibrosis.  But had to stop in 3 months after he developed significant sunburn. Started on Ofev  with a reduced dose of 100 mg twice daily in Dec 2023  He has been following with Dr. Brien, ENT at [facility] and receiving superior laryngeal nerve blocks for a chronic cough, which have improved his symptoms by approximately 85%. However, he still experiences coughing when taking deep breaths. He has undergone multiple swallowing tests, which show no aspiration but some residual material in the throat.  Interim history Discussed the use of AI scribe software for clinical note transcription with the patient, who gave verbal consent to proceed.  History of Present Illness Todd [name] is an 83 year old male with atrial fibrillation and progressive pulmonary fibrosis who presents for follow-up of his chronic cough and medication management.  Reports worsening fatigue, weight loss, diarrhea with  Ofev .  Recently saw GI for this issue His CT scan shows worsening pulmonary fibrosis, pneumomediastinum and small right pneumothorax.   Relevant pulmonary history Pets: None Occupation: Retired, used to work in [employer] Exposures: Significant exposure to asbestos in previous line of work Smoking history: None  Outpatient Encounter Medications as of 03/20/2024  Medication Sig   acetaminophen  (TYLENOL ) 650 MG CR tablet Take 650 mg by mouth as needed for pain.   alendronate  (FOSAMAX ) 70 MG tablet Take 1 tablet (70 mg total) by mouth every 7 (seven) days. Take with a full glass of water on an empty stomach.   atorvastatin  (LIPITOR ) 80 MG tablet Take 1 tablet (80 mg total) by mouth at bedtime.   cholestyramine  (QUESTRAN ) 4 g packet Take 1 packet (4 g total) by mouth daily before lunch.   clotrimazole  (LOTRIMIN ) 1 % cream Apply 1 Application topically 2 (two) times daily.   Continuous Glucose Receiver (FREESTYLE LIBRE 3 READER) DEVI 1 each by Does not apply route 4 (four) times daily -  before meals and at bedtime.   Continuous Glucose Sensor (FREESTYLE LIBRE 3 PLUS SENSOR) MISC Change sensor every 15 days.  E11.51   cyanocobalamin  (VITAMIN B12) 1000 MCG tablet Take 1,000 mcg by mouth every Monday, Wednesday, and Friday.   dapagliflozin  propanediol ([name] ) 10 MG TABS tablet Take 1 tablet (10 mg total) by mouth daily before breakfast.   dofetilide  (TIKOSYN ) 250 MCG capsule Take 1 capsule (250 mcg total) by mouth 2 (two) times daily.   DULoxetine  (CYMBALTA ) 20 MG capsule Take 1 capsule (20 mg total) by mouth daily.   Famotidine  (PEPCID  PO) Take 2 tablets by mouth  at bedtime.   furosemide  (LASIX ) 40 MG tablet Take 1 tablet (40 mg total) by mouth daily. (Patient taking differently: Take 40 mg by mouth in the morning.)   GLOBAL EASE INJECT PEN NEEDLES 31G X 5 MM MISC USE TO INJECT INSULIN  3 TIMES DAILY. MAY DISPENSE ANY MANUFACTURER COVERED BY PATIENT'S INSURANCE.   insulin  glargine (LANTUS ) 100 UNIT/ML  injection Inject 10 Units into the skin as needed.   Insulin  Pen Needle (PEN NEEDLES) 32G X 5 MM MISC UAD for daily lantus  injection   ipratropium (ATROVENT ) 0.06 % nasal spray Place 2-4 sprays into both nostrils every 8 (eight) hours as needed for rhinitis.   magnesium  oxide (MAG-OX) 400 MG tablet Take 1 tablet (400 mg total) by mouth daily.   metoprolol  tartrate (LOPRESSOR ) 100 MG tablet TAKE 1 TABLET BY MOUTH 2 TIMES DAILY.   Multiple Vitamin (MULTIVITAMIN PO) Take 1 tablet by mouth daily.   Nintedanib  (OFEV ) 100 MG CAPS Take 1 capsule (100 mg total) by mouth 2 (two) times daily.   nitroGLYCERIN  (NITROSTAT ) 0.4 MG SL tablet Place 1 tablet (0.4 mg total) under the tongue every 5 (five) minutes as needed for chest pain.   NOVOLOG  FLEXPEN 100 UNIT/ML FlexPen INJECT 4 UNITS INTO THE SKIN 3 TIMES DAILY WITH MEALS. ONLY TAKE IF EATING A MEAL AND BLOOD GLUCOSE (BG) IS 80 OR HIGHER.   Omega-3 Fatty Acids (FISH OIL PO) Take 1 capsule by mouth 2 (two) times daily.   omeprazole  (PRILOSEC) 40 MG capsule TAKE 1 CAPSULE BY MOUTH 2 TIMES DAILY. (Patient taking differently: Take 40 mg by mouth every morning.)   ONETOUCH DELICA LANCETS 33G MISC TEST BLOOD SUGAR ONCE DAILY   ONETOUCH VERIO test strip USE UP TO 4 TIMES A DAY AS DIRECTED   PIP PEN NEEDLES 31G X 31G X 5 MM MISC USE TO INJECT INSULIN  3 TIMES DAILY. MAY DISPENSE ANY MANUFACTURER COVERED BY PATIENT''S INSURANCE.   predniSONE  (DELTASONE ) 10 MG tablet TAKE 1 TABLET (10 MG TOTAL) BY MOUTH DAILY WITH BREAKFAST. (Patient taking differently: Take 5 mg by mouth daily with breakfast.)   rivaroxaban  (XARELTO ) 20 MG TABS tablet TAKE 1 TABLET (20 MG TOTAL) BY MOUTH DAILY WITH SUPPER.   spironolactone  (ALDACTONE ) 25 MG tablet Take 0.5 tablets (12.5 mg total) by mouth daily.   No facility-administered encounter medications on file as of 03/20/2024.   Today's Vitals   03/20/24 1321  TempSrc: Oral  Weight: 117 lb (53.1 kg)  Height: 5' 8 (1.727 m)   Body  mass index is 17.79 kg/m.   Physical Exam   Gen:      No acute distress HEENT:  EOMI, sclera anicteric Neck:     No masses; no thyromegaly Lungs:    Bibasilar crackles CV:         Regular rate and rhythm; no murmurs Abd:      + bowel sounds; soft, non-tender; no palpable masses, no distension Ext:    No edema; adequate peripheral perfusion Neuro: alert and oriented x 3 Psych: normal mood and affect   Data Reviewed: Imaging Chest x-ray 12/04/16-stable interstitial opacities CT abdomen 11/20/13-mild reticular opacities at the bases CT high-resolution 01/02/17- subpleural reticulation, mild traction bronchiectasis, possible early honeycombing with basal gradient. 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  Slightly worse compared to 2018 High-res CT 03/21/2019-probable UIP High-res CT 03/30/2020-unchanged probable UIP fibrosis High-res CT 09/02/2020- unchanged probable UIP fibrosis High-res CT 06/30/2021-slight worsening of fibrosis and probable UIP pattern High-res CT 03/28/2023-stable pattern of UIP  pulmonary fibrosis. High-res CT 03/10/2024- worsening of moderate to severe pulmonary fibrosis, extensive pneumomediastinum, lower cervical emphysema, tiny less than 5% left pneumothorax Chest x-ray 03/14/2024-preliminary read shows pulmonary fibrosis, no pneumothorax I have reviewed the images personally  PFTs  03/20/17- FVC 2.74 (72%], FEV1 2.38 (87%), F/F 87, TLC 62%, DLCO 15.89 (56%)  01/15/2018-FVC 2.74 [73%], FEV1 2.33 [86%], F/F 85, TLC 71%, DLCO 13.13 (46%)  07/23/2018-FVC 2.51 [69%), FEV1 2.10 [78%), DLCO 15.16 (53%) Mild restriction, moderate-severe diffusion defect.  04/02/2019 FVC 2.70 [72%], FEV1 2.18 [82%], F/F 81, DLCO 13.8 [60%] Moderate diffusion defect  08/31/2020 FVC 2.52 [65%], FEV1 2.25 [82%], F/F 89, TLC 4.64 [69%], DLCO 20.59 [88%] Mild restriction.  Improvement in diffusion capacity  08/24/2021 FVC 2.03 [53%], FEV1 1.53 [56%], F/F 75, TLC 2.78 [41%], DLCO 13.45  [58%] Severe restriction, moderate diffusion defect  07/24/2022 Unable to complete due to cough  FENO 12/18/16- 12  Labs ILD serologies 01/09/17- all negative except for mild elevation in SCL 75.  CMP 06/23/22- Normal liver function  Cardiac: Echo 10/19/2023-LVEF 45%, mild reduction in RV systolic function, RV size is mildly enlarged.  Normal PA systolic pressure, estimated RVSP 33.3 Assessment & Plan Progressive pulmonary fibrosis, asbestos exposure. 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   Started Esbriet  in January 2023 but we had to stop it as he developed significant sunburn. Now on Ofev  after review of CT scan showing interval progression of pulmonary fibrosis we have decided to initiate therapy.  Started at a lower dose of 100 mg twice daily as he already has some diarrhea at baseline.  Unfortunately this lower dose is also poorly tolerated due to significant diarrhea  His last CT scan shows worsening pulmonary fibrosis with pneumomediastinum, small right pneumothorax likely due to coughing His chest x-ray last week did not show any significant worsening of pneumothorax.  Final radiology read is pending  -Advised him to stop the Ofev  for 2 to 4 weeks and focus on symptom management. - Since his CT is getting worse with no good options for treatment I would advise consideration of DNR, palliative care.  They wanted to discuss with the rest of the family.  Chronic Cough Likely secondary to pulmonary fibrosis He has been treated adequately for GERD and tried on antihistamines, gabapentin , amitriptyline  which did not help Chronic cough significantly improved with superior laryngeal nerve blocks.  Follows with Dr. Brien at [facility]. Cough exacerbated by deep breaths but overall 85% improvement.  Long-term Prednisone  Use Long-term prednisone  use leading to potential side effects including exacerbation of atrial fibrillation, elevated blood sugars,  and possible contribution to compression deformities. Plan to taper prednisone  due to these adverse effects.  - Currently on 5 mg/day.  Will stop prednisone   Compression Deformities Compression deformities noted on recent MRI, possibly related to long-term prednisone  use. Reports back pain. Tapering off prednisone   Diabetes Mellitus Diabetes mellitus with previously elevated blood sugars during hospitalization. Recent A1c improved but not optimal. Tapering of prednisone    Health maintenance 01/15/2015-Prevnar 13 05/05/2010-Pneumovax  Plan/Recommendations: Give a break from Ofev  Discontinue prednisone  Ongoing goals of care discussion.  Palliative care referral  Lonna Coder MD [facility] 03/20/2024, 1:18 PM  CC: Geofm Glade PARAS, MD

## 2024-03-20 NOTE — Telephone Encounter (Signed)
 PT is in serious pain and would like to know if the appt today can be switched to a MYCHART. Please call PT to advise. I let them know that they MUST do it thru [facility] and to bring up app so we can explain. If not, can Dr. Theophilus work him in?

## 2024-03-20 NOTE — Progress Notes (Signed)
 03/20/2024 Todd [name] [id number] 1940/09/07   HISTORY OF PRESENT ILLNESS: This is an 83 year old male who is a patient of Dr. Darilyn.  Historically has had issues with constipation.  Family called recently complaining of diarrhea.  Stool study for C. difficile was ordered and was negative.  Stool culture was ordered and came back positive for Salmonella.  Due to his age and persistence of symptoms he was treated.  Cannot use Cipro  because of interaction with Tikosyn  so they recommended amoxicillin .  Treated for 5 days.  Diarrhea did not improve.  He is here today with his wife and the pastor's wife who is helping him out.  They tell me that he has had diarrhea for a couple of months, probably 6 to 8 weeks.  He said that he poops all the time.  Always liquid or loose stool, no solid stool.  Says that he has some bright red blood that appears fresh, probably from soreness of his bottom from having so much stool and wiping so much.  He is on Ofev , but has been on that for couple of years.  He has been on Tikosyn  just since May.  He is on magnesium  oxide.  That had been discontinued for a little while, about a couple of weeks and then just restarted again.  No abdominal pain.  No changes in diet.  Past Medical History:  Diagnosis Date   Allergic rhinitis    Asthma    Atrial fibrillation with rapid ventricular response (HCC)    a. newly diagnosed 11/03/13, spont conv to NSR, placed on xarelto .   CAD (coronary artery disease)    a. 1999; s/p CABG: LIMA to LAD, SVG to OM1, SVG to OM2, SVG to RCA  b. Normal nuc 10/2013 (done because of new onset AF.)   COVID    Diabetes mellitus (HCC)    HLD (hyperlipidemia)    HTN (hypertension)    Nephrolithiasis    Overweight(278.02)    PVD (peripheral vascular disease) (HCC)    Carotid stenosis   Stroke [facility])    Past Surgical History:  Procedure Laterality Date   CARDIOVERSION N/A 11/12/2023   Procedure: CARDIOVERSION;  Surgeon: Francyne Headland, MD;   Location: [facility] INVASIVE CV LAB;  Service: Cardiovascular;  Laterality: N/A;   CAROTID ENDARTERECTOMY  1999   COLONOSCOPY  2014   negative;Dr Jakie   COLONOSCOPY W/ POLYPECTOMY  2009   Dr Jakie   CORONARY ARTERY BYPASS GRAFT  03/1998   HEMORRHOID BANDING     INTRACAPSULAR CATARACT EXTRACTION Bilateral 2018   LEFT HEART CATH AND CORS/GRAFTS ANGIOGRAPHY N/A 01/08/2023   Procedure: LEFT HEART CATH AND CORS/GRAFTS ANGIOGRAPHY;  Surgeon: Swaziland, Peter M, MD;  Location: [facility] INVASIVE CV LAB;  Service: Cardiovascular;  Laterality: N/A;   NASAL SINUS SURGERY      reports that he has never smoked. He has never used smokeless tobacco. He reports that he does not drink alcohol and does not use drugs. family history includes Asthma in his sister; Colon cancer (age of onset: 72) in his brother; Diabetes in his father; Fibromyalgia in his sister and sister; Heart attack (age of onset: 35) in his brother; Heart attack (age of onset: 9) in his brother and mother; Hemochromatosis in his brother; Lupus in his sister; Prostate cancer in his brother; Stroke (age of onset: 60) in his father. Allergies  Allergen Reactions   Amlodipine Besylate     Rash Because of a history of documented adverse serious drug  reaction;Medi Alert bracelet  is recommended   Iodinated Contrast Media Rash    Rash  Because of a history of documented adverse serious drug reaction;Medi Alert bracelet  is recommended  Rash, Because of a history of documented adverse serious drug reaction;Medi Alert bracelet  is recommended   Metformin  And Related Diarrhea    Take a lower dose      Outpatient Encounter Medications as of 03/20/2024  Medication Sig   acetaminophen  (TYLENOL ) 650 MG CR tablet Take 650 mg by mouth as needed for pain.   alendronate  (FOSAMAX ) 70 MG tablet Take 1 tablet (70 mg total) by mouth every 7 (seven) days. Take with a full glass of water on an empty stomach.   atorvastatin  (LIPITOR ) 80 MG tablet Take 1 tablet  (80 mg total) by mouth at bedtime.   clotrimazole  (LOTRIMIN ) 1 % cream Apply 1 Application topically 2 (two) times daily.   Continuous Glucose Receiver (FREESTYLE LIBRE 3 READER) DEVI 1 each by Does not apply route 4 (four) times daily -  before meals and at bedtime.   Continuous Glucose Sensor (FREESTYLE LIBRE 3 PLUS SENSOR) MISC Change sensor every 15 days.  E11.51   cyanocobalamin  (VITAMIN B12) 1000 MCG tablet Take 1,000 mcg by mouth every Monday, Wednesday, and Friday.   dapagliflozin  propanediol ([name] ) 10 MG TABS tablet Take 1 tablet (10 mg total) by mouth daily before breakfast.   dofetilide  (TIKOSYN ) 250 MCG capsule Take 1 capsule (250 mcg total) by mouth 2 (two) times daily.   DULoxetine  (CYMBALTA ) 20 MG capsule Take 1 capsule (20 mg total) by mouth daily.   Famotidine  (PEPCID  PO) Take 2 tablets by mouth at bedtime.   furosemide  (LASIX ) 40 MG tablet Take 1 tablet (40 mg total) by mouth daily. (Patient taking differently: Take 40 mg by mouth in the morning.)   GLOBAL EASE INJECT PEN NEEDLES 31G X 5 MM MISC USE TO INJECT INSULIN  3 TIMES DAILY. MAY DISPENSE ANY MANUFACTURER COVERED BY PATIENT'S INSURANCE.   insulin  glargine (LANTUS ) 100 UNIT/ML injection Inject 10 Units into the skin as needed.   Insulin  Pen Needle (PEN NEEDLES) 32G X 5 MM MISC UAD for daily lantus  injection   ipratropium (ATROVENT ) 0.06 % nasal spray Place 2-4 sprays into both nostrils every 8 (eight) hours as needed for rhinitis.   magnesium  oxide (MAG-OX) 400 MG tablet Take 1 tablet (400 mg total) by mouth daily.   metoprolol  tartrate (LOPRESSOR ) 100 MG tablet TAKE 1 TABLET BY MOUTH 2 TIMES DAILY.   Multiple Vitamin (MULTIVITAMIN PO) Take 1 tablet by mouth daily.   Nintedanib  (OFEV ) 100 MG CAPS Take 1 capsule (100 mg total) by mouth 2 (two) times daily.   nitroGLYCERIN  (NITROSTAT ) 0.4 MG SL tablet Place 1 tablet (0.4 mg total) under the tongue every 5 (five) minutes as needed for chest pain.   NOVOLOG  FLEXPEN 100  UNIT/ML FlexPen INJECT 4 UNITS INTO THE SKIN 3 TIMES DAILY WITH MEALS. ONLY TAKE IF EATING A MEAL AND BLOOD GLUCOSE (BG) IS 80 OR HIGHER.   Omega-3 Fatty Acids (FISH OIL PO) Take 1 capsule by mouth 2 (two) times daily.   omeprazole  (PRILOSEC) 40 MG capsule TAKE 1 CAPSULE BY MOUTH 2 TIMES DAILY. (Patient taking differently: Take 40 mg by mouth every morning.)   ONETOUCH DELICA LANCETS 33G MISC TEST BLOOD SUGAR ONCE DAILY   ONETOUCH VERIO test strip USE UP TO 4 TIMES A DAY AS DIRECTED   PIP PEN NEEDLES 31G X 31G X 5 MM  MISC USE TO INJECT INSULIN  3 TIMES DAILY. MAY DISPENSE ANY MANUFACTURER COVERED BY PATIENT''S INSURANCE.   predniSONE  (DELTASONE ) 10 MG tablet TAKE 1 TABLET (10 MG TOTAL) BY MOUTH DAILY WITH BREAKFAST. (Patient taking differently: Take 5 mg by mouth daily with breakfast.)   rivaroxaban  (XARELTO ) 20 MG TABS tablet TAKE 1 TABLET (20 MG TOTAL) BY MOUTH DAILY WITH SUPPER.   spironolactone  (ALDACTONE ) 25 MG tablet Take 0.5 tablets (12.5 mg total) by mouth daily.   No facility-administered encounter medications on file as of 03/20/2024.    REVIEW OF SYSTEMS  : All other systems reviewed and negative except where noted in the History of Present Illness.   PHYSICAL EXAM: BP 112/68   Pulse 71   Ht 5' 8 (1.727 m)   Wt 117 lb (53.1 kg)   BMI 17.79 kg/m  General: Chronically ill-appearing white male in no acute distress Head: Normocephalic and atraumatic Eyes:  Sclerae anicteric, conjunctiva pink. Ears: Normal auditory acuity Lungs: Clear throughout to auscultation; no W/R/R. Heart: Regular rate and rhythm Abdomen: Soft, non-distended.  BS present.  Non-tender. Musculoskeletal: Symmetrical with no gross deformities  Skin: No lesions on visible extremities Extremities: No edema  Neurological: Alert oriented x 4, grossly non-focal Psychological:  Alert and cooperative. Normal mood and affect  ASSESSMENT AND PLAN: *Diarrhea: Family called a couple of weeks ago with complaints  of diarrhea.  C. difficile was negative, but stool culture results showed Salmonella.  Because of persistent symptoms and his age we elected to treat him.  Cannot take Cipro  because it could interfere with his Tikosyn  so was treated with amoxicillin  for 5 days.  Diarrhea did not improve.  I feel strongly that his diarrhea could be due to his medications.  He is on Tikosyn  just since May, which can cause diarrhea and his diarrhea has been present for about the past 6 to 8 weeks/couple of months.  He is also on Ofev  which can cause diarrhea, but has been on that for a couple of years.  He also takes magnesium  oxide, which they report was discontinued for a couple of weeks and then just restarted again.  -I prescribed cholestyramine  1 packet daily at lunchtime to be taken a few hours away from any other medications.  They will check with the A-fib clinic to be sure no interaction with his Tikosyn .  Prescription sent to pharmacy. - Otherwise they need to discuss with his other physicians regarding holding any of these medications to see if the diarrhea improves.  Long discussion with the patient, his wife, and their helper today who is their pastor's wife as well. - Recommended use of Desitin on his bottom as a barrier.   CC:  Geofm Glade PARAS, MD

## 2024-03-20 NOTE — Patient Instructions (Addendum)
 We discussed your CT scan today showing leakage and small collapse of your right lung Will continue to monitor it with a chest x-ray I will advise you to stop the Ofev  for at least 2 to 4 weeks and see if that improves the diarrhea and weight loss You can stop the prednisone  as well as it does not appear to be helping.  Follow-up in 3 months with video visit

## 2024-03-20 NOTE — Telephone Encounter (Signed)
 Patient was meant to have PFT's that were not completed I think the best solution would be to cancel todays appointment,and reschedule both PFT and follow up unless his severe pain is pulmonary related.

## 2024-03-20 NOTE — Patient Instructions (Addendum)
 Please discuss with Pulmonary regarding medication OFEV  to see if this could be causing increased diarrhea.   Please purchase the following medications over the counter and take as directed: [place] as needed.   If your blood pressure at your visit was 140/90 or greater, please contact your primary care physician to follow up on this.  _______________________________________________________  If you are age 81 or older, your body mass index should be between 23-30. Your Body mass index is 17.79 kg/m. If this is out of the aforementioned range listed, please consider follow up with your Primary Care Provider.  If you are age 75 or younger, your body mass index should be between 19-25. Your Body mass index is 17.79 kg/m. If this is out of the aformentioned range listed, please consider follow up with your Primary Care Provider.   ________________________________________________________  The [facility] providers would like to encourage you to use MYCHART to communicate with providers for non-urgent requests or questions.  Due to long hold times on the telephone, sending your provider a message by [facility] may be a faster and more efficient way to get a response.  Please allow 48 business hours for a response.  Please remember that this is for non-urgent requests.  _______________________________________________________  [facility] is using a team-based approach to care.  Your team is made up of your doctor and two to three APPS. Our APPS (Nurse Practitioners and Physician Assistants) work with your physician to ensure care continuity for you. They are fully qualified to address your health concerns and develop a treatment plan. They communicate directly with your gastroenterologist to care for you. Seeing the Advanced Practice Practitioners on your physician's team can help you by facilitating care more promptly, often allowing for earlier appointments, access to diagnostic testing,  procedures, and other specialty referrals.    We have sent the following medications to your pharmacy for you to pick up at your convenience: Cholestyramine   Thank you for choosing me and [facility].  Jessica Zehr, PA-C

## 2024-03-20 NOTE — Progress Notes (Signed)
   03/20/2024 Name: Todd [name] MRN: [id number] DOB: 06-19-1941  Patient is appearing on a report for [facility] [place] [facility] and last engaged with the clinical pharmacist to discuss diabetes on 06/26/2023. Contacted patient today to discuss diabetes management and completed medication review.   Diabetes Plan from last clinical pharmacist appointment:  Diabetes: - Currently uncontrolled, Goal A1c <8.0% - [name]  faxed 11/4 - received confirmation [name]  PAP was approved, processed 11/7. Takes 10-14 business days to receive in the mail. Pt's wife informed. - Continue current regimen. Recommend A1c check in April. Follow Up Plan: PRN(copy/paste from last note)   Medication Adherence Barriers Identified:  Patient made recommended medication changes per plan: Yes Paient has seen PCP since last PharmD appointment. Spoke to patient's daughter and patient is currently on [name]  10mg  daily (gets thru PAP), Novolog  given based on patient's blood sugars and if he is eating, Metformin  XR 500mg  twice a day. Patient's daugher informs Lantus  has been stopped but she could not remember why. Access issues with any new medication or testing device: No Per Dr Annemarie, last filled 30 day supply of Metformin  on 01/26/24 (this medication has been held, started and stopped over the last month), [employer] sensors last filled on 03/12/2024 for a month supply and  Novolog  FlexPen last filled for 1 box, 5 pens/22ml  (billed as 100 day supply)  on 01/07/24 Patient is checking blood sugars as prescribed: Yes Patient uses CGM to check his blood sugar. Patient's daughter informs his blood sugars are all over the place. She informs patient is having chronic diarrhea and is seeing a GI MD today. Patient was treated with Amoxicillin  for 5 days due to Salmonella. Patient's daughter informs diarrhea did not improve while on this and actually feels as though it worsened it. She informs because of patient's chronic diarrhea it is  hard to get him to eat. She informs they do try and give him Boost protein shakes. She informs his blood sugars are allover the place. She informs the highest it has been was 398 yesterday and then 250. She informs they are mostly in the 230-280 range. She informs a few weeks ago they were running low in the 45-55 range. Patient's daughter informs patient is on Tikosyn  and any medication changes has to go thru the [facility] clinic for approval.   Medication Adherence Barriers Addressed/Actions Taken:  Reviewed medication changes per plan from last clinical pharmacist note Educated patient's daughter to contact pharmacy regarding new prescriptions and reills  Patient Assistance in Progress for [name]  Patient Assistance Program approved Reviewed instructions for monitoring blood sugars at home and reminded patient to keep a written log to review with pharmacist Reminded patient of date/time of upcoming clinical pharmacist follow up and any upcoming PCP/specialists visits. Patient denies transportation barriers to the appointment. Yes  Next clinical pharmacist appointment is scheduled for: TBD  Kate Caddy, CPhT [facility] Office: [phone] Email: [email or web address]

## 2024-03-21 ENCOUNTER — Other Ambulatory Visit: Payer: Self-pay | Admitting: Cardiology

## 2024-03-21 ENCOUNTER — Telehealth: Payer: Self-pay | Admitting: Internal Medicine

## 2024-03-21 ENCOUNTER — Ambulatory Visit: Payer: Self-pay | Admitting: Pulmonary Disease

## 2024-03-21 DIAGNOSIS — J849 Interstitial pulmonary disease, unspecified: Secondary | ICD-10-CM

## 2024-03-21 DIAGNOSIS — I4891 Unspecified atrial fibrillation: Secondary | ICD-10-CM

## 2024-03-21 NOTE — Telephone Encounter (Signed)
 Prescription refill request for Xarelto  received.  Indication:afib Last office visit:7/25 Weight:53.1  kg Age:83 Scr:0.7  7/25 CrCl:62.16  ml/min  Prescription refilled

## 2024-03-21 NOTE — Telephone Encounter (Signed)
 Copied from CRM #[id number]. Topic: Clinical - Medication Question >> Mar 21, 2024 10:15 AM Gennette ORN wrote: Reason for CRM: Patient's daughter Luke wants to know if the diabetic medication will work still does he still need to take it.

## 2024-03-24 ENCOUNTER — Other Ambulatory Visit: Payer: Self-pay | Admitting: Internal Medicine

## 2024-03-24 ENCOUNTER — Ambulatory Visit: Payer: Self-pay

## 2024-03-24 MED ORDER — CEPHALEXIN 500 MG PO CAPS: 500.0000 mg | ORAL_CAPSULE | Freq: Three times a day (TID) | ORAL | 0 refills | Status: AC

## 2024-03-24 NOTE — Telephone Encounter (Signed)
 Also sent you a mychart message in regards to this- pt is experiencing  burning and yeast when urinating, cannot come in to office and daughter kim would like something sent in

## 2024-03-24 NOTE — Telephone Encounter (Signed)
 FYI Only or Action Required?: Action required by provider: Medication request.  Patient was last seen in primary care on 12/06/2023 by Geofm Glade PARAS, MD.  Called Nurse Triage reporting Dysuria.  Symptoms began several days ago.  Symptoms are: unchanged.  Triage Disposition: See Physician Within 24 Hours  Patient/caregiver understands and will follow disposition?: No, wishes to speak with PCP       Copied from CRM #[id number]. Topic: Clinical - Medical Advice >> Mar 24, 2024 12:47 PM Macario HERO wrote: Reason for CRM: Patient daughter Luke said that her father is not well and is not able to come into the office due to waiting for palliative care to come see him. Patient is experiencing serious burning and yeast when urinating.       Reason for Disposition  All other males with painful urination  Answer Assessment - Initial Assessment Questions Patient's daughter states the patient is unable to come in for an appointment and would like something prescribed for his urinary symptoms if possible. Please contact her with a response to this request at [phone] when able.        1. SEVERITY: How bad is the pain?  (e.g., Scale 1-10; mild, moderate, or severe)     Mild to moderate  4. ONSET: When did the painful urination start?      3-4 days ago  5. FEVER: Do you have a fever? If Yes, ask: What is your temperature, how was it measured, and when did it start?     No 6. PAST UTI: Have you had a urine infection before? If Yes, ask: When was the last time? and What happened that time?      No 7. CAUSE: What do you think is causing the painful urination?      Possible UTI 8. OTHER SYMPTOMS: Do you have any other symptoms? (e.g., flank pain, penis discharge, scrotal pain, blood in urine)     Yeast around foreskin of penis, small spots of blood noted in briefs  Protocols used: Urination Pain - Male-A-AH

## 2024-03-24 NOTE — Telephone Encounter (Signed)
 I think we probably need to stop the [name]  since this is likely the cause of the yeast infections.  Restart Lantus  insulin  10 units nightly and insulin  sliding scale as needed.  Update us  with sugars so we can adjust medication if needed   Start Keflex  500 mg 3 times daily x 1 week.  Continue using antiyeast cream.  Keflex  sent to pharmacy.    Medication list updated

## 2024-03-25 ENCOUNTER — Telehealth: Payer: Self-pay | Admitting: Pulmonary Disease

## 2024-03-25 ENCOUNTER — Telehealth: Payer: Self-pay | Admitting: Internal Medicine

## 2024-03-25 NOTE — Telephone Encounter (Signed)
 Hey there would you mind signing the palliative care order?Thank you!

## 2024-03-25 NOTE — Telephone Encounter (Signed)
 Copied from CRM #[id number]. Topic: Clinical - Medical Advice >> Mar 25, 2024  8:17 AM Pinkey ORN wrote: Reason for CRM: Medical Advice >> Mar 25, 2024  8:19 AM Pinkey ORN wrote: Patient's daughter Charol he has an severe UTI and is unable to go to the hospital and or urgent care. Patient is requesting some medication for the UTI. Patient is requesting a call back [phone]

## 2024-03-25 NOTE — Telephone Encounter (Signed)
 Copied from CRM #[id number]. Topic: Referral - Question >> Mar 25, 2024 10:46 AM Porter L wrote: Reason for CRM: Almira from [facility] called asking for a verbal order for patient to be discharged from home health for PT, per family requests, due to patient not be able to partake , callback [phone]

## 2024-03-25 NOTE — Telephone Encounter (Signed)
 Duplicate message

## 2024-03-25 NOTE — Telephone Encounter (Signed)
 Okay for orders?

## 2024-03-25 NOTE — Telephone Encounter (Signed)
 Message left today for discharge

## 2024-03-26 ENCOUNTER — Telehealth: Payer: Self-pay | Admitting: Internal Medicine

## 2024-03-26 DIAGNOSIS — I5022 Chronic systolic (congestive) heart failure: Secondary | ICD-10-CM

## 2024-03-26 DIAGNOSIS — I4891 Unspecified atrial fibrillation: Secondary | ICD-10-CM

## 2024-03-26 DIAGNOSIS — J8410 Pulmonary fibrosis, unspecified: Secondary | ICD-10-CM

## 2024-03-26 NOTE — Telephone Encounter (Signed)
 Copied from CRM [id number]. Topic: Referral - Request for Referral >> Mar 26, 2024  2:08 PM Burnard DEL wrote: Did the patient discuss referral with their provider in the last year? No (If No - schedule appointment) (If Yes - send message)  Appointment offered? No  Type of order/referral and detailed reason for visit: Lorie/[facility] [phone]) -Had a decline in health,Not eating,Incontinent,Sleep 16-18 hours,Patient has end of life stare,can barely walk,having swallowing difficulties with thin liquids -Patient was started on thick liquids and refused because he had horrible coughing spells -Family was told he would go home to be comfortable  Preference of office, provider, location:  Fax #[phone] If referral order, have you been seen by this specialty before? No (If Yes, this issue or another issue? When? Where?  Can we respond through MyChart? No

## 2024-03-26 NOTE — Telephone Encounter (Signed)
 Copied from CRM #[id number]. Topic: Referral - Question >> Mar 26, 2024  2:48 PM Susanna ORN wrote: Lea, with [facility], returning call to [facility] about orders. Called [facility] & spoke with Rocky and she stated that [name] was with a patient. Myles Lea that orders were signed and that message was for discharge per [facility]. Please give her a call back. She stated that she may be speaking with patients but you can leave a message. CB #: [phone].

## 2024-03-27 NOTE — Telephone Encounter (Signed)
 Referral faxed today.

## 2024-03-27 NOTE — Addendum Note (Signed)
 Addended by: GEOFM GLADE PARAS on: 03/27/2024 01:28 PM   Modules accepted: Orders

## 2024-03-27 NOTE — Telephone Encounter (Signed)
 Message received about patient needing referral for hospice.  This has been sent to Dr. Geofm to order.

## 2024-03-27 NOTE — Telephone Encounter (Signed)
 ordered

## 2024-03-27 NOTE — Telephone Encounter (Signed)
 Left message for Todd [name] today.  If she calls back please see what she is needing as previous message is not very complete on what is needed.

## 2024-03-27 NOTE — Telephone Encounter (Unsigned)
 Copied from CRM #[id number]. Topic: Referral - Status >> Mar 27, 2024 12:34 PM Charolett L wrote: Reason for CRM: Katheryn from [employer] a hospice referral and a returning the call to carla She also stated that a message can be left its a secure line.. Fax#[phone]

## 2024-03-27 NOTE — Telephone Encounter (Signed)
 Copied from CRM #[id number]. Topic: Clinical - Home Health Verbal Orders >> Mar 27, 2024  3:24 PM Burnard DEL wrote: Caller/Agency: Courtney/[facility] Callback Number: [phone]  Family want Dr Geofm to over see patient care and symptoms while on hospice care.Will Dr Geofm agree to this?

## 2024-03-31 NOTE — Telephone Encounter (Unsigned)
 Copied from CRM [id number]. Topic: General - Other >> Mar 31, 2024  3:15 PM Sophia H wrote: Reason for CRM: Spoke with Berneda - [facility].  Wanting to follow up on if Dr. Geofm would serve as attending physician on patients case.    [phone] (secured vmail)

## 2024-03-31 NOTE — Telephone Encounter (Signed)
 yes

## 2024-03-31 NOTE — Telephone Encounter (Signed)
 Spoke with [facility] this morning.

## 2024-03-31 NOTE — Telephone Encounter (Signed)
 Order has been signed.

## 2024-03-31 NOTE — Telephone Encounter (Signed)
 Spoke with [facility] today.

## 2024-04-01 ENCOUNTER — Other Ambulatory Visit: Payer: Self-pay | Admitting: Internal Medicine

## 2024-04-08 LAB — STATE LABORATORY REPORT

## 2024-04-11 ENCOUNTER — Ambulatory Visit: Admitting: Gastroenterology

## 2024-04-23 ENCOUNTER — Telehealth (HOSPITAL_BASED_OUTPATIENT_CLINIC_OR_DEPARTMENT_OTHER): Payer: Self-pay

## 2024-04-28 ENCOUNTER — Ambulatory Visit: Admitting: "Endocrinology

## 2024-05-08 ENCOUNTER — Telehealth: Payer: Self-pay

## 2024-05-08 NOTE — Telephone Encounter (Signed)
 Chart opened up to complete posting sheet for cert form for [employer] orders.

## 2024-05-15 ENCOUNTER — Ambulatory Visit (HOSPITAL_COMMUNITY): Admitting: Internal Medicine

## 2024-05-21 NOTE — Telephone Encounter (Signed)
Removed from recall list. 

## 2024-05-21 NOTE — Telephone Encounter (Signed)
 Copied from CRM #[id number]. Topic: General - Other >> Apr 23, 2024 12:06 PM Chantha C wrote: Reason for CRM: Patient's child Luke [phone] states they received a message to have patient schedule an appointment, patient is in hospice and was told patient has a matter of days. Please remove patient from a follow up appointment. Also, patient wife and family wants to thank Dr. Theophilus for his care with patient.

## 2024-05-21 DEATH — deceased

## 2024-06-06 ENCOUNTER — Ambulatory Visit: Admitting: Internal Medicine

## 2024-07-08 ENCOUNTER — Ambulatory Visit: Admitting: Cardiology
